# Patient Record
Sex: Female | Born: 1960 | Race: White | Hispanic: No | Smoking: Former smoker
Health system: Southern US, Community
[De-identification: ages and names within clinical notes are randomized; demographics above are authoritative.]

## PROBLEM LIST (undated history)

## (undated) DIAGNOSIS — R194 Change in bowel habit: Secondary | ICD-10-CM

## (undated) DIAGNOSIS — E871 Hypo-osmolality and hyponatremia: Secondary | ICD-10-CM

## (undated) DIAGNOSIS — R112 Nausea with vomiting, unspecified: Secondary | ICD-10-CM

## (undated) DIAGNOSIS — F1910 Other psychoactive substance abuse, uncomplicated: Secondary | ICD-10-CM

## (undated) DIAGNOSIS — F32A Depression, unspecified: Secondary | ICD-10-CM

## (undated) DIAGNOSIS — G43909 Migraine, unspecified, not intractable, without status migrainosus: Secondary | ICD-10-CM

## (undated) DIAGNOSIS — M810 Age-related osteoporosis without current pathological fracture: Secondary | ICD-10-CM

## (undated) DIAGNOSIS — R32 Unspecified urinary incontinence: Secondary | ICD-10-CM

## (undated) DIAGNOSIS — R143 Flatulence: Secondary | ICD-10-CM

## (undated) DIAGNOSIS — T7840XA Allergy, unspecified, initial encounter: Secondary | ICD-10-CM

## (undated) DIAGNOSIS — F1011 Alcohol abuse, in remission: Secondary | ICD-10-CM

## (undated) DIAGNOSIS — R51 Headache: Secondary | ICD-10-CM

## (undated) DIAGNOSIS — F4310 Post-traumatic stress disorder, unspecified: Secondary | ICD-10-CM

## (undated) DIAGNOSIS — R519 Headache, unspecified: Secondary | ICD-10-CM

## (undated) DIAGNOSIS — A64 Unspecified sexually transmitted disease: Secondary | ICD-10-CM

## (undated) DIAGNOSIS — Z9889 Other specified postprocedural states: Secondary | ICD-10-CM

## (undated) DIAGNOSIS — Z87440 Personal history of urinary (tract) infections: Secondary | ICD-10-CM

## (undated) DIAGNOSIS — F329 Major depressive disorder, single episode, unspecified: Secondary | ICD-10-CM

## (undated) DIAGNOSIS — N3010 Interstitial cystitis (chronic) without hematuria: Secondary | ICD-10-CM

## (undated) DIAGNOSIS — K219 Gastro-esophageal reflux disease without esophagitis: Secondary | ICD-10-CM

## (undated) DIAGNOSIS — M1990 Unspecified osteoarthritis, unspecified site: Secondary | ICD-10-CM

## (undated) HISTORY — PX: INCONTINENCE SURGERY: SHX676

## (undated) HISTORY — PX: LAPAROSCOPY: SHX197

## (undated) HISTORY — DX: Gastro-esophageal reflux disease without esophagitis: K21.9

## (undated) HISTORY — DX: Hypo-osmolality and hyponatremia: E87.1

## (undated) HISTORY — DX: Unspecified urinary incontinence: R32

## (undated) HISTORY — DX: Interstitial cystitis (chronic) without hematuria: N30.10

## (undated) HISTORY — DX: Other psychoactive substance abuse, uncomplicated: F19.10

## (undated) HISTORY — DX: Unspecified sexually transmitted disease: A64

## (undated) HISTORY — DX: Age-related osteoporosis without current pathological fracture: M81.0

## (undated) HISTORY — DX: Allergy, unspecified, initial encounter: T78.40XA

## (undated) HISTORY — DX: Migraine, unspecified, not intractable, without status migrainosus: G43.909

## (undated) HISTORY — DX: Flatulence: R14.3

## (undated) HISTORY — PX: BREAST BIOPSY: SHX20

## (undated) HISTORY — PX: TOTAL HIP ARTHROPLASTY: SHX124

## (undated) HISTORY — DX: Depression, unspecified: F32.A

## (undated) HISTORY — DX: Major depressive disorder, single episode, unspecified: F32.9

## (undated) HISTORY — DX: Personal history of urinary (tract) infections: Z87.440

## (undated) HISTORY — DX: Post-traumatic stress disorder, unspecified: F43.10

## (undated) HISTORY — DX: Alcohol abuse, in remission: F10.11

## (undated) HISTORY — PX: COLONOSCOPY: SHX174

## (undated) HISTORY — DX: Change in bowel habit: R19.4

---

## 1998-08-21 ENCOUNTER — Other Ambulatory Visit: Admission: RE | Admit: 1998-08-21 | Discharge: 1998-08-21 | Payer: Self-pay | Admitting: *Deleted

## 1999-11-05 ENCOUNTER — Other Ambulatory Visit: Admission: RE | Admit: 1999-11-05 | Discharge: 1999-11-05 | Payer: Self-pay | Admitting: Obstetrics and Gynecology

## 2000-02-09 ENCOUNTER — Ambulatory Visit (HOSPITAL_COMMUNITY): Admission: RE | Admit: 2000-02-09 | Discharge: 2000-02-09 | Payer: Self-pay | Admitting: Obstetrics and Gynecology

## 2001-07-18 ENCOUNTER — Ambulatory Visit (HOSPITAL_BASED_OUTPATIENT_CLINIC_OR_DEPARTMENT_OTHER): Admission: RE | Admit: 2001-07-18 | Discharge: 2001-07-18 | Payer: Self-pay | Admitting: Urology

## 2001-08-29 ENCOUNTER — Other Ambulatory Visit: Admission: RE | Admit: 2001-08-29 | Discharge: 2001-08-29 | Payer: Self-pay | Admitting: Obstetrics and Gynecology

## 2001-09-18 ENCOUNTER — Encounter: Payer: Self-pay | Admitting: Obstetrics and Gynecology

## 2001-09-18 ENCOUNTER — Encounter: Admission: RE | Admit: 2001-09-18 | Discharge: 2001-09-18 | Payer: Self-pay | Admitting: Obstetrics and Gynecology

## 2002-08-06 ENCOUNTER — Other Ambulatory Visit: Admission: RE | Admit: 2002-08-06 | Discharge: 2002-08-06 | Payer: Self-pay | Admitting: Obstetrics and Gynecology

## 2004-04-02 ENCOUNTER — Encounter: Admission: RE | Admit: 2004-04-02 | Discharge: 2004-04-02 | Payer: Self-pay | Admitting: Family Medicine

## 2004-07-30 ENCOUNTER — Encounter: Admission: RE | Admit: 2004-07-30 | Discharge: 2004-07-30 | Payer: Self-pay | Admitting: Family Medicine

## 2007-02-08 ENCOUNTER — Encounter: Admission: RE | Admit: 2007-02-08 | Discharge: 2007-02-08 | Payer: Self-pay | Admitting: Family Medicine

## 2009-04-03 ENCOUNTER — Encounter: Admission: RE | Admit: 2009-04-03 | Discharge: 2009-04-03 | Payer: Self-pay | Admitting: Family Medicine

## 2010-06-27 ENCOUNTER — Encounter: Payer: Self-pay | Admitting: Family Medicine

## 2010-06-29 ENCOUNTER — Encounter: Payer: Self-pay | Admitting: Family

## 2010-06-29 ENCOUNTER — Encounter: Payer: Self-pay | Admitting: Family Medicine

## 2010-09-04 ENCOUNTER — Ambulatory Visit (INDEPENDENT_AMBULATORY_CARE_PROVIDER_SITE_OTHER): Payer: BC Managed Care – PPO | Admitting: Family Medicine

## 2010-09-04 ENCOUNTER — Encounter: Payer: Self-pay | Admitting: Family Medicine

## 2010-09-04 VITALS — BP 112/74 | Temp 98.8°F | Ht 64.0 in | Wt 138.0 lb

## 2010-09-04 DIAGNOSIS — F339 Major depressive disorder, recurrent, unspecified: Secondary | ICD-10-CM | POA: Insufficient documentation

## 2010-09-04 DIAGNOSIS — F329 Major depressive disorder, single episode, unspecified: Secondary | ICD-10-CM

## 2010-09-04 DIAGNOSIS — F17200 Nicotine dependence, unspecified, uncomplicated: Secondary | ICD-10-CM

## 2010-09-04 DIAGNOSIS — Z720 Tobacco use: Secondary | ICD-10-CM

## 2010-09-04 DIAGNOSIS — F411 Generalized anxiety disorder: Secondary | ICD-10-CM

## 2010-09-04 DIAGNOSIS — F32A Depression, unspecified: Secondary | ICD-10-CM

## 2010-09-04 DIAGNOSIS — F419 Anxiety disorder, unspecified: Secondary | ICD-10-CM

## 2010-09-04 MED ORDER — CITALOPRAM HYDROBROMIDE 20 MG PO TABS: 20.0000 mg | ORAL_TABLET | Freq: Every day | ORAL | Status: DC

## 2010-09-04 NOTE — Progress Notes (Signed)
  Subjective:    Patient ID: Gabriela Shepard, female    DOB: [birth year], 50 y.o.   MRN: [id number]  HPI New patient to establish care. Past medical history reviewed. Patient has history of depression and chronic anxiety issues. Recently postmenopausal. Had significant hot flushes and replacement with estradiol. Also had Mirena IUD placed several months ago-reportedly for irregular periods. Still has some hot flashes. Current medications are clonazepam 1 mg 3 times a day, estradiol patch (unknown dosage) once weekly, and Wellbutrin XL 150 mg once daily. Allergy to sulfa.  The patient underwent divorce several years ago. Still dealing with a lot of stress issues related to that along with anxiety of not able to communicate with estranged 33 year old son. This has been very difficult emotionally for her. She's had significant counseling. Currently not on antidepressants. Feels anxious quite often. No suicidal ideation. Poor sleep quality.  Patient smokes less than one half pack yesterday trying to quit. Alcohol consumption with wine- sometimes excessively up to one bottle per day but not consistently. No other drug use. Reportedly diagnosed with posttraumatic stress disorder by counselor recently.   Review of Systems  Constitutional: Positive for fatigue. Negative for fever, chills, activity change and appetite change.  HENT: Negative for hearing loss and trouble swallowing.   Respiratory: Negative for cough and shortness of breath.   Cardiovascular: Negative for chest pain and leg swelling.  Gastrointestinal: Negative for nausea, vomiting and abdominal pain.  Genitourinary: Negative for dysuria.  Musculoskeletal: Negative for joint swelling and arthralgias.  Neurological: Negative for dizziness, seizures and syncope.  Hematological: Negative for adenopathy. Does not bruise/bleed easily.  Psychiatric/Behavioral: Positive for sleep disturbance and dysphoric mood. Negative for suicidal ideas,  hallucinations, confusion and self-injury. The patient is nervous/anxious.        Objective:   Physical Exam  Constitutional: She is oriented to person, place, and time. She appears well-developed and well-nourished.       Patient is anxious but appropriate in conversation  HENT:  Head: Normocephalic and atraumatic.  Mouth/Throat: Oropharynx is clear and moist. No oropharyngeal exudate.  Eyes: Pupils are equal, round, and reactive to light.  Neck: Neck supple. No thyromegaly present.  Cardiovascular: Normal rate, regular rhythm and normal heart sounds.   No murmur heard. Pulmonary/Chest: Effort normal and breath sounds normal. She has no wheezes. She has no rales.  Musculoskeletal: She exhibits no edema.  Lymphadenopathy:    She has no cervical adenopathy.  Neurological: She is alert and oriented to person, place, and time. No cranial nerve deficit.  Skin: No rash noted.  Psychiatric: Her behavior is normal. Thought content normal.          Assessment & Plan:  #1 situational stressors and anxiety #2 depression currently not remission #3 perimenopausal versus postmenopausal #4 history of reported urine incontinence  Start Celexa 20 mg daily and continue counseling. Reassess in 3 weeks time. Discussed other alternatives for hormonal therapy that time

## 2010-09-04 NOTE — Progress Notes (Deleted)
  Subjective:    Patient ID: Gabriela Shepard, female    DOB: [birth year], 50 y.o.   MRN: [id number]  HPI Patient recently noticed some slightly painful nodules under the skin and upper abdominal region and thigh region. She has past history of breast cancer. No recent appetite or weight changes. These areas have not margin of the past few weeks. No overlying skin changes.  Plan complaint of some increased thirst. Occasional moderate without vomiting. Concern about diabetes risk. Positive family history. Some increased urine frequency. No weight loss. Previous blood sugars pre-diabetes range   Review of Systems     Objective:   Physical Exam  Constitutional: She appears well-developed and well-nourished.  Cardiovascular: Normal rate, regular rhythm and normal heart sounds.   No murmur heard. Pulmonary/Chest: No respiratory distress. She has no wheezes. She has no rales.  Abdominal: Soft. There is no rebound and no guarding.       Patient is a very small areas are palpated which are less than 1/2 cm slightly tender in renal bed and fatty type density scattered upper and mid abdominal region. No hepatomegaly or splenomegaly. No overlying skin changes  Musculoskeletal: She exhibits no edema.          Assessment & Plan:  Probable panniculitis abdomen. Reassurance given. Polydipsia. Nonfasting blood sugar 134. Return for fasting to 3 weeks [place] upper thighs

## 2010-09-24 ENCOUNTER — Encounter: Payer: Self-pay | Admitting: Family Medicine

## 2010-09-24 ENCOUNTER — Ambulatory Visit (INDEPENDENT_AMBULATORY_CARE_PROVIDER_SITE_OTHER): Payer: BC Managed Care – PPO | Admitting: Family Medicine

## 2010-09-24 DIAGNOSIS — M546 Pain in thoracic spine: Secondary | ICD-10-CM

## 2010-09-24 DIAGNOSIS — M549 Dorsalgia, unspecified: Secondary | ICD-10-CM

## 2010-09-24 DIAGNOSIS — N951 Menopausal and female climacteric states: Secondary | ICD-10-CM

## 2010-09-24 MED ORDER — CYCLOBENZAPRINE HCL 10 MG PO TABS: 10.0000 mg | ORAL_TABLET | Freq: Two times a day (BID) | ORAL | Status: DC | PRN

## 2010-09-24 MED ORDER — CLONAZEPAM 1 MG PO TABS: 1.0000 mg | ORAL_TABLET | Freq: Three times a day (TID) | ORAL | Status: DC | PRN

## 2010-09-24 MED ORDER — ESTROGENS CONJUGATED 0.3 MG PO TABS: 0.3000 mg | ORAL_TABLET | Freq: Every day | ORAL | Status: DC

## 2010-09-24 MED ORDER — BUPROPION HCL ER (XL) 150 MG PO TB24: 150.0000 mg | ORAL_TABLET | Freq: Every day | ORAL | Status: DC

## 2010-09-24 NOTE — Progress Notes (Signed)
  Subjective:    Patient ID: Gabriela Shepard, female    DOB: [birth year], 50 y.o.   MRN: [id number]  HPI Followup from last visit. Refer to prior note. History of depression and anxiety. She started back Premarin 0.3 mg and had great improvement in mood. She never started Celexa. She has Mirena IUD so has source of progestin. Has been on addition of Premarin 0.3 mg orally past. 2 Climara patch which did not seem to work as well. She takes Wellbutrin 150 mg daily and needs refills.  Also takesKlonopin when necessary in the past but uses rarely. Has eliminated alcohol completely since last visit.  She has frequent upper back and neck muscular soreness and stiffness. Has used Flexeril as needed. This seems to help. Needs refills.   Review of Systems  Constitutional: Negative for appetite change and unexpected weight change.  HENT: Positive for neck stiffness.   Respiratory: Negative for cough and shortness of breath.   Cardiovascular: Negative for chest pain and leg swelling.  Gastrointestinal: Negative for abdominal pain.  Neurological: Negative for headaches.  Hematological: Negative for adenopathy.  Psychiatric/Behavioral: Negative for suicidal ideas.       Objective:   Physical Exam  Constitutional: She appears well-developed and well-nourished.  HENT:  Mouth/Throat: No oropharyngeal exudate.  Neck: No thyromegaly present.  Cardiovascular: Normal rate, regular rhythm and normal heart sounds.   No murmur heard. Pulmonary/Chest: No respiratory distress. She has no wheezes. She has no rales.  Musculoskeletal: She exhibits no edema.  Lymphadenopathy:    She has no cervical adenopathy.  Skin: No rash noted.          Assessment & Plan:  #1 Depression stable.  Refilled Wellbutrin for one year. #2  Perimenopausal.  Refilled Premarin low dose and this is not unopposed with her Mirena.  #3  Frequent upper back pain and muscle spasm.  Refilled Flexeril.

## 2010-10-23 NOTE — Op Note (Signed)
[facility]  Patient:    Gabriela Shepard, Gabriela Shepard                        MRN: [id number] Proc. Date: 02/09/00 Adm. Date:  [id number] Attending:  Oliver Pila                           Operative Report  PREOPERATIVE DIAGNOSES: 1. Chronic right-sided pelvic pain. 2. Previous cesarean section.  POSTOPERATIVE DIAGNOSES: 1. Chronic right-sided pelvic pain. 2. Previous cesarean section. 3. 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  PROCEDURE:  Diagnostic laparoscopy, lysis of adhesions.  SURGEON:  Huel Cote, M.D.  ANESTHESIA:  General.  FLUIDS/ESTIMATED BLOOD LOSS:  Minimal.  URINE OUTPUT:  200 cc clear.  IV FLUIDS:  2200 cc L.R.  FINDINGS:  Extensive pelvic adhesions of the anterior uterine fundus to the right anterior abdominal wall.  Ovaries are within normal limits bilaterally. Tubes are within normal limits bilaterally.  The appendix was normal, and the liver was normal.  The dense scar tissue was adherent from the top of the fundus down to the lower uterine segment.  DESCRIPTION OF PROCEDURE:  The patient was taken to the operating room where a general anesthesia was obtained without difficulty.  She was then prepped and draped in the normal sterile fashion in the dorsal lithotomy position.  A speculum was then placed within the vagina and uterus sounded to approximately 9-10 cm.  This was then removed, and the ZUMI syringe placed within the uterus for uterine manipulation.  The speculum was then removed from the vagina, and attention was turned to the patients abdomen where a small umbilical incision was made, approximately 1 cm with the scalpel.  The Veress needle was then easily introduced into the abdomen, and pneumoperitoneum obtained with approximately two liters of CO2 gas.  The abdomen and pelvis were then inspected with findings as previously stated.  Given that the ovaries and tubes were normal bilaterally,  attention was immediately turned to the anterior uterine wall.  Two additional ports were placed, one approximately 2 cm above the symphysis pubis and the other in the left lower quadrant.  These were 5 mm ports and placed under direct visualization.  Blunt probe was placed within the 5 mm port, and the dense adhesions were probed.  A bipolar cutting cautery unit was then applied to these adhesions in a slow and sequential fashion.  These were taken down with cauterization performed at each step with no significant bleeding noted.  This was also performed on the right round ligament which was also adherent abnormally to the anterior abdominal wall. This was severed, and the uterine fundus was significantly freed from the anterior wall in this process.  This was carried down to the level of the anterior cul-de-sac which was partially obliterated from these adhesions until there was no ability to distinguish between the bladder and the anterior uterine fundus.  Thus with the adhesions lysed, the uterus appeared to be in a normal position without significant further adhesions.  Therefore, the patients abdomen was once again inspected.  All areas were hemostatic.   The 5 mm ports were removed under direct visualization with no bleeding noted. The pneumoperitoneum was evacuated, and finally the 10 mm port was removed under direct visualization as well.  The umbilical incision was reinforced with 0 Vicryl at fascia level.  The skin was closed with 3-0  Vicryl and a subcuticular stitch at all sites.  Sponge, lap, and needle counts were correct x 2, and the patient was taken to the recovery room in stable condition. DD:  02/09/00 TD:  02/09/00 Job: [id number]

## 2010-10-23 NOTE — H&P (Signed)
[facility]  Patient:    Gabriela Shepard, Gabriela Shepard                          MRN: [id number] Adm. Date:  02/09/00 Attending:  Alvino Chapel, M.D.                         History and Physical  HISTORY OF PRESENT ILLNESS:  Patient is a 50 year old G3, P2-0-1-2 who has had a long history of right-sided pelvic pain with an extensive workup by her previous gynecologist.  Patient recently was forced to switch gynecologists secondary to a change in insurance.  Patients pain has been present for approximately one to two years; however, in the past several months, has become increasingly problematic, interfering with work and at a hobby she has with tennis.  The patient reports a chronic right-sided pain which makes it difficult for her to ambulate with her menstrual cycles as well as sciatic inflammation which is present with her cycles.  Her past workup included a vaginal sonogram which revealed a uterus with a 2- to 3-cm fibroid.  Patient had been treated on several different hormonal regimens; however, these did not significantly improve her pain.  Her husband has had a vasectomy; she does not require contraception at this time.  PAST MEDICAL HISTORY 1. History of premenstrual dysphoric disorder which had been treated with    Prozac in the past. 2. She has stress urinary incontinence. 3. In 1992, she had a cesarean section.  MEDICATIONS:  None.  ALLERGIES:  She has no known drug allergies.  PAST GYNECOLOGICAL HISTORY:  No abnormal Pap smears, with her last normal Pap smear being in May of 2001.  FAMILY HISTORY:  There is no significant history of breast cancer.  There is some heart disease.  PAST OBSTETRICAL HISTORY:  In 1992, she had a cesarean section and in 1994, a normal spontaneous vaginal delivery.  PHYSICAL EXAMINATION  VITAL SIGNS:  Weight is 132 pounds.  Height if 5 feet 4 inches.  HEART:  Patient has regular rate and rhythm to her  heart.  LUNGS:  Clear to auscultation bilaterally.  ABDOMEN:  Soft and nontender.  PELVIC:  Her uterus is upper limits of normal size and slightly tender.  She is tender in her right lower quadrant to deep palpation.  The left adnexa is normal with no masses noted.  ASSESSMENT:  Patient was counseled as to her options and given her failure of medical therapy in the past and the significant impact this is having upon her work and social life, patient would like to proceed with a diagnostic laparoscopy.  She was counseled as to the risks and benefits of the procedure including risk of damage to bowel and bladder as well as the risks of bleeding and infection.  Patient understands and desires to proceed.  Patient was counseled should there be significant pelvic adhesions or endometriosis, these would be cauterized at the time of laparoscopy. DD:  02/05/00 TD:  02/05/00 Job: [id number]

## 2010-10-23 NOTE — Op Note (Signed)
[facility]  Patient:    Gabriela Shepard, Gabriela Shepard Visit Number: [id number] MRN: [id number]          Service Type: NES Location: [facility] Attending Physician:  Evlyn Clines Dictated by:   Excell Seltzer. Annabell Howells, M.D. Proc. Date: 07/18/01 Admit Date:  07/18/2001   CC:         Alvino Chapel, M.D.   Operative Report  PREOPERATIVE DIAGNOSIS:  Cystocele and stress incontinence.  POSTOPERATIVE DIAGNOSIS:  Cystocele and stress incontinence.  OPERATION:  Anterior repair and SPARC sling.  SURGEON:  Excell Seltzer. Annabell Howells, M.D.  ANESTHESIA:  General  DRAIN:  Foley catheter.  COMPLICATIONS:  None.  INDICATIONS:  Gabriela Shepard is a 50 year old white female who has had progressive problems with stress incontinence.  It is impacting on her daily activities. She also was found to have a moderate cystocele.  After discussing the treatment options, she has elected to undergo a SPARC sling and anterior repair.  FINDINGS AND PROCEDURE:  The patient was taken to the operating room after receiving p.o. Tequin.  General anesthetic was induced.  She was placed in the lithotomy position.  Her mons was shaved.  She was prepped with Betadine solution and draped in the usual sterile fashion.  A weighted vaginal retractor was placed and a Foley catheter was inserted.  The bladder was drained.  The anterior vaginal wall was infiltrated with 10 cc of 1% Lidocaine with epinephrine to help define the planes and control hemostasis.  An anterior midline vaginal wall incision was made with the knife.  This was carried down through the mucosa to the pubovesical fascia.  The mucosa was elevated off the pubovesical and pubourethral fascia to the lateral aspects of the cystocele bulge.  The dissection was carried back to the cervix.  Once adequate dissection had been performed, the cystocele was reduced using two layers of 2-0 Vicryl horizontal mattress sutures.  Once the cystocele repair had been  performed, sponge was tacked in the vaginal vault and two incisions were made 2 cm lateral to the midline just above the pubis on each side.  Each incision was approximately 1.5 cm in length.  The [facility] trocars were then brought onto the field.  The right SPARC trocar was passed through the right abdominal incision.  The tip was passed through the anterior rectus fascia until the top of the pubis could be felt.  The trocar was then walked along the back of the pubis down lateral to the urethra.  The urethra was held to the contralateral side using a finger in the incision.  The trocar was then brought down on the tip of the finger at the mid urethral level.  This was then repeated in identical fashion on the left.  Once both trocars were passed, cystoscopy was performed using the 22 [place] scope and the 70 degree lens.  Examination revealed no evidence of trocar damage to the bladder wall with a fully distended bladder.  The ureters were in their normal anatomic position and were unremarkable.  Once cystoscopic inspection had been performed, the scope was removed and the sling material was snapped on to the end of the trocars and then drawn back up to the abdominal incisions on each side.  Once both end of the tapes were in position, the trocars were trimmed off and the sheath was removed on each side.  Care was taken to ensure appropriate position of the sling material at the mid urethra and an adequate laxity of  the tape was ensured by keeping a right angle between the urethra and the tape. Once the tape was in good position, the cystoscope was then replaced and a second inspection of the bladder wall was performed.  This demonstrated no evidence of injury to the bladder wall despite a distended bladder.  The bladder was left full and pressure on the bladder produced flow from the ureter which was appropriate to avoid over correction.  At this point, a Foley catheter was reinserted and the  bladder was partially drained. The catheter was then plugged. The vaginal wall mucosa was then trimmed with care being taken to avoid trimming excess mucosa distally and the vaginal mucosa was then closed using a running locked 2-0 Vicryl stitch.  There was some moderate venous bleeding after passage of the tape back into the abdominal incisions.  Total blood loss was approximately 300 cc.  Once the vaginal wall mucosa was closed, the weighted speculum was removed.  The cystocele appeared to be well reduced.  A packing of 2 inch Iodoform gauze was placed.  The anterior abdominal wall was cleaned, prepped with tincture of Benzoin and Steri-Strips were used to close the small incisions on the anterior abdominal wall.  At this point, the Foley catheter was placed to straight drainage.  The patient was taken down from the lithotomy position and moved to the recovery room in stable condition.  She will be maintained with the packing and Foley for three hours. It will then be removed if she is able to void.  She will be discharged home without a catheter.  There were no complications during the procedure. Dictated by:   Excell Seltzer. Annabell Howells, M.D. Attending Physician:  Evlyn Clines DD:  07/18/01 TD:  07/18/01 Job: [id number]

## 2010-11-20 ENCOUNTER — Other Ambulatory Visit: Payer: Self-pay | Admitting: *Deleted

## 2010-11-20 MED ORDER — CLONAZEPAM 1 MG PO TABS: 1.0000 mg | ORAL_TABLET | Freq: Three times a day (TID) | ORAL | Status: DC | PRN

## 2011-01-21 ENCOUNTER — Other Ambulatory Visit: Payer: Self-pay | Admitting: Chiropractic Medicine

## 2011-01-21 ENCOUNTER — Other Ambulatory Visit: Payer: Self-pay | Admitting: *Deleted

## 2011-01-21 ENCOUNTER — Ambulatory Visit
Admission: RE | Admit: 2011-01-21 | Discharge: 2011-01-21 | Disposition: A | Discharge status: Home / Self Care | Payer: BC Managed Care – PPO | Source: Ambulatory Visit | Attending: *Deleted | Admitting: *Deleted

## 2011-01-21 DIAGNOSIS — M545 Low back pain, unspecified: Secondary | ICD-10-CM

## 2011-01-21 DIAGNOSIS — R52 Pain, unspecified: Secondary | ICD-10-CM

## 2011-05-05 ENCOUNTER — Other Ambulatory Visit (INDEPENDENT_AMBULATORY_CARE_PROVIDER_SITE_OTHER): Payer: BC Managed Care – PPO

## 2011-05-05 DIAGNOSIS — Z0000 Encounter for general adult medical examination without abnormal findings: Secondary | ICD-10-CM

## 2011-05-05 LAB — BASIC METABOLIC PANEL
BUN: 11 mg/dL (ref 6–23)
Creatinine, Ser: 0.9 mg/dL (ref 0.4–1.2)
GFR: 68.57 mL/min (ref >60.00)
Glucose, Bld: 89 mg/dL (ref 70–99)
Potassium: 4 mEq/L (ref 3.5–5.1)

## 2011-05-05 LAB — POCT URINALYSIS DIPSTICK
Bilirubin, UA: NEGATIVE
Blood, UA: NEGATIVE
Glucose, UA: NEGATIVE
Ketones, UA: NEGATIVE
Spec Grav, UA: 1.01
pH, UA: 5.5

## 2011-05-05 LAB — CBC WITH DIFFERENTIAL/PLATELET
Basophils Absolute: 0 10*3/uL (ref 0.0–0.1)
HCT: 39.7 % (ref 36.0–46.0)
Lymphs Abs: 1.2 10*3/uL (ref 0.7–4.0)
Monocytes Absolute: 0.5 10*3/uL (ref 0.1–1.0)
Monocytes Relative: 12.6 % — ABNORMAL HIGH (ref 3.0–12.0)
Platelets: 228 10*3/uL (ref 150.0–400.0)
RDW: 12 % (ref 11.5–14.6)

## 2011-05-05 LAB — LIPID PANEL
Cholesterol: 153 mg/dL (ref 0–200)
HDL: 56.2 mg/dL (ref >39.00)
VLDL: 8 mg/dL (ref 0.0–40.0)

## 2011-05-05 LAB — HEPATIC FUNCTION PANEL
AST: 17 U/L (ref 0–37)
Total Bilirubin: 0.7 mg/dL (ref 0.3–1.2)

## 2011-05-05 LAB — TSH: TSH: 2.58 u[IU]/mL (ref 0.35–5.50)

## 2011-05-12 ENCOUNTER — Ambulatory Visit (INDEPENDENT_AMBULATORY_CARE_PROVIDER_SITE_OTHER): Payer: BC Managed Care – PPO | Admitting: Family Medicine

## 2011-05-12 ENCOUNTER — Encounter: Payer: Self-pay | Admitting: Family Medicine

## 2011-05-12 VITALS — BP 102/80 | HR 72 | Temp 98.7°F | Resp 12 | Ht 63.75 in | Wt 135.0 lb

## 2011-05-12 DIAGNOSIS — Z0000 Encounter for general adult medical examination without abnormal findings: Secondary | ICD-10-CM

## 2011-05-12 DIAGNOSIS — Z23 Encounter for immunization: Secondary | ICD-10-CM

## 2011-05-12 MED ORDER — FLUTICASONE PROPIONATE 50 MCG/ACT NA SUSP: 2.0000 | Freq: Every day | NASAL | Status: DC

## 2011-05-12 NOTE — Progress Notes (Signed)
Subjective:    Patient ID: Gabriela Shepard, female    DOB: 28-Nov-1960, 50 y.o.   MRN: [id number]  HPI  Patient here for complete physical. She has made major changes in her life over the past year. She's given up alcohol after history of some binges during the past year. Currently attends AA.  No alcohol in over 6 months. She has history of depression currently stable. Medications reviewed. Remains on Mirina IUD.  Pap smears have consistently been normal over the last several years.  Normal pap last year. Tetanus from 2007. Mammogram recently normal. No history of screening colonoscopy. No flu vaccine yet. Inconsistent exercise  Past Medical History  Diagnosis Date  . Depression   . Urine incontinence   . History of UTI    Past Surgical History  Procedure Date  . Incontinence surgery     reports that she quit smoking about 6 years ago. Her smoking use included Cigarettes. She has a 5 pack-year smoking history. She does not have any smokeless tobacco history on file. She reports that she drinks alcohol. She reports that she does not use illicit drugs. family history includes Alcohol abuse in her other; Arthritis in her other; Hypertension in her other; and Mental illness in her other. Allergies  Allergen Reactions  . Sulfa Antibiotics     hives      Review of Systems  Constitutional: Positive for appetite change. Negative for fever, activity change and fatigue.  HENT: Negative for hearing loss, ear pain, sore throat and trouble swallowing.   Eyes: Negative for visual disturbance.  Respiratory: Negative for cough and shortness of breath.   Cardiovascular: Negative for chest pain and palpitations.  Gastrointestinal: Positive for constipation. Negative for nausea, vomiting, abdominal pain, diarrhea and blood in stool.  Genitourinary: Negative for dysuria and hematuria.  Musculoskeletal: Negative for myalgias, back pain and arthralgias.  Skin: Negative for rash.  Neurological:  Negative for dizziness, syncope and headaches.  Hematological: Negative for adenopathy.  Psychiatric/Behavioral: Negative for confusion and dysphoric mood.       Objective:   Physical Exam  Constitutional: She is oriented to person, place, and time. She appears well-developed and well-nourished.  HENT:  Head: Normocephalic and atraumatic.  Eyes: EOM are normal. Pupils are equal, round, and reactive to light.  Neck: Normal range of motion. Neck supple. No thyromegaly present.  Cardiovascular: Normal rate, regular rhythm and normal heart sounds.   No murmur heard. Pulmonary/Chest: Breath sounds normal. No respiratory distress. She has no wheezes. She has no rales.  Abdominal: Soft. Bowel sounds are normal. She exhibits no distension and no mass. There is no tenderness. There is no rebound and no guarding.  Genitourinary:       Breast exam normal. Pap smear deferred since she's had regular for several years. We'll plan a repeat next year  Musculoskeletal: Normal range of motion. She exhibits no edema.  Lymphadenopathy:    She has no cervical adenopathy.  Neurological: She is alert and oriented to person, place, and time. She displays normal reflexes. No cranial nerve deficit.  Skin: No rash noted.  Psychiatric: She has a normal mood and affect. Her behavior is normal. Judgment and thought content normal.          Assessment & Plan:  Complete physical. Wellness issues addressed. Flu vaccine given. Labs reviewed with patient. Schedule screening colonoscopy. Patient prefers this year if possible. Tetanus up to date. Recent mammogram normal.  Chronic rhinitis. Question allergic vs vasomotor. Add Flonase  2 sprays per nostril once daily

## 2011-05-17 ENCOUNTER — Encounter: Payer: Self-pay | Admitting: Gastroenterology

## 2011-05-21 ENCOUNTER — Ambulatory Visit (AMBULATORY_SURGERY_CENTER): Payer: BC Managed Care – PPO

## 2011-05-21 VITALS — Ht 64.0 in | Wt 135.0 lb

## 2011-05-21 DIAGNOSIS — Z1211 Encounter for screening for malignant neoplasm of colon: Secondary | ICD-10-CM

## 2011-05-21 MED ORDER — PEG-KCL-NACL-NASULF-NA ASC-C 100 G PO SOLR: 1.0000 | Freq: Once | ORAL | Status: DC

## 2011-05-24 ENCOUNTER — Encounter: Payer: Self-pay | Admitting: Gastroenterology

## 2011-05-27 ENCOUNTER — Telehealth: Payer: Self-pay

## 2011-05-27 NOTE — Telephone Encounter (Signed)
Request received from the pharmacy regarding [employer] prep expense.  I have a discount coupon that the pt can pick up at the office.  Left message on machine to call back

## 2011-05-27 NOTE — Telephone Encounter (Signed)
Pt returned call and will pick up rebate

## 2011-06-04 ENCOUNTER — Encounter: Payer: Self-pay | Admitting: Gastroenterology

## 2011-06-04 ENCOUNTER — Ambulatory Visit (AMBULATORY_SURGERY_CENTER): Payer: BC Managed Care – PPO | Admitting: Gastroenterology

## 2011-06-04 VITALS — BP 128/82 | HR 68 | Temp 97.1°F | Resp 32 | Ht 64.0 in | Wt 135.0 lb

## 2011-06-04 DIAGNOSIS — Z1211 Encounter for screening for malignant neoplasm of colon: Secondary | ICD-10-CM

## 2011-06-04 DIAGNOSIS — D126 Benign neoplasm of colon, unspecified: Secondary | ICD-10-CM

## 2011-06-04 MED ORDER — SODIUM CHLORIDE 0.9 % IV SOLN: 500.0000 mL | INTRAVENOUS | Status: DC

## 2011-06-04 NOTE — Progress Notes (Signed)
Patient did not experience any of the following events: a burn prior to discharge; a fall within the facility; wrong site/side/patient/procedure/implant event; or a hospital transfer or hospital admission upon discharge from the facility. (G8907) Patient did not have preoperative order for IV antibiotic SSI prophylaxis. (G8918)  

## 2011-06-04 NOTE — Op Note (Signed)
[facility] [address] [place], [place]  [address]  COLONOSCOPY PROCEDURE REPORT  PATIENT:  Gabriela, Shepard  MR#:  [id number] BIRTHDATE:  [birth year], 50 yrs. old  GENDER:  female ENDOSCOPIST:  Rachael Fee, MD REF. BY:  [name], M.D. PROCEDURE DATE:  06/04/2011 PROCEDURE:  Colonoscopy with snare polypectomy ASA CLASS:  Class II INDICATIONS:  Routine Risk Screening MEDICATIONS:   Fentanyl 75 mcg IV, These medications were titrated to patient response per physician's verbal order, Versed 7 mg IV  DESCRIPTION OF PROCEDURE:   After the risks benefits and alternatives of the procedure were thoroughly explained, informed consent was obtained.  Digital rectal exam was performed and revealed no rectal masses.   The LB 180AL [id number] endoscope was introduced through the anus and advanced to the cecum, which was identified by both the appendix and ileocecal valve, without limitations.  The quality of the prep was good..  The instrument was then slowly withdrawn as the colon was fully examined. <<PROCEDUREIMAGES>> FINDINGS:  Two sessile polyps were found. These were located in ascending colon, 5-46mm across, both removed with cold snare, both retrieved and sent to pathology (jar 1) (see image3 and image5). This was otherwise a normal examination of the colon (see image6, image2, and image1).   Retroflexed views in the rectum revealed no abnormalities. COMPLICATIONS:  None  ENDOSCOPIC IMPRESSION: 1) Two polyps were found, both were removed and sent to pathology 2) Otherwise normal examination  RECOMMENDATIONS: 1) If the polyp(s) removed today are proven to be adenomatous (pre-cancerous) polyps, you will need a repeat colonoscopy in 5 years. Otherwise you should continue to follow colorectal cancer screening guidelines for "routine risk" patients with colonoscopy in 10 years. 2) You will receive a letter within 1-2 weeks with the results of your biopsy as well  as final recommendations. Please call my office if you have not received a letter after 3 weeks.  ______________________________ Rachael Fee, MD  n. eSIGNED:   Rachael Fee at 06/04/2011 03:26 PM  Jessica Priest, [phone]

## 2011-06-04 NOTE — Patient Instructions (Signed)
FOLLOW THE INSTRUCTIONS ON THE GREEN AND BLUE DISCHARGE SHEETS.  CONTINUE YOUR MEDICATIONS.  AWAIT PATHOLOGY RESULTS.

## 2011-06-04 NOTE — Progress Notes (Signed)
IV very positional.  IV is in the vein, but I have o pull back to get a IV fluid drip.  MAW  IV sit assessed constantly.  No swelling, tissue around IV site soft. No redness noted. maw

## 2011-06-07 ENCOUNTER — Telehealth: Payer: Self-pay | Admitting: *Deleted

## 2011-06-07 ENCOUNTER — Telehealth: Payer: Self-pay | Admitting: Gastroenterology

## 2011-06-07 NOTE — Telephone Encounter (Signed)
Follow up Call- Patient questions:  Do you have a fever, pain , or abdominal swelling? no Pain Score  0 *  Have you tolerated food without any problems? yes  Have you been able to return to your normal activities? yes  Do you have any questions about your discharge instructions: Diet   no Medications  no Follow up visit  no  Do you have questions or concerns about your Care? no  Actions: * If pain score is 4 or above: No action needed, pain <4. Patient stating Patty frome 3rd floor GI had left a coupon for free Moviprep at desk last week. When she arrived at pharmacy it was only 20 % off. Patient requesting free coupon. Referred back to 3rd floor personal with telephone number.

## 2011-06-07 NOTE — Telephone Encounter (Signed)
Pt was left a free movi prep coupon at the front desk but can not find it another was mailed to her home

## 2011-07-06 ENCOUNTER — Other Ambulatory Visit: Payer: Self-pay | Admitting: Family Medicine

## 2011-07-06 NOTE — Telephone Encounter (Signed)
Refill once OK.  My understanding is that she has not been taking this regularly.

## 2011-07-06 NOTE — Telephone Encounter (Signed)
Last filled 11-20-10 #90 with 3 refills CPX 05-12-11

## 2011-07-07 NOTE — Telephone Encounter (Signed)
VM left for pt that Dr Caryl Never did not think she was taking this med regularly?

## 2011-09-23 ENCOUNTER — Encounter: Payer: Self-pay | Admitting: Family Medicine

## 2011-09-23 ENCOUNTER — Ambulatory Visit (INDEPENDENT_AMBULATORY_CARE_PROVIDER_SITE_OTHER): Payer: BC Managed Care – PPO | Admitting: Family Medicine

## 2011-09-23 VITALS — BP 140/80 | Temp 98.0°F | Wt 132.0 lb

## 2011-09-23 DIAGNOSIS — A09 Infectious gastroenteritis and colitis, unspecified: Secondary | ICD-10-CM

## 2011-09-23 DIAGNOSIS — A084 Viral intestinal infection, unspecified: Secondary | ICD-10-CM

## 2011-09-23 MED ORDER — DICYCLOMINE HCL 20 MG PO TABS: 20.0000 mg | ORAL_TABLET | Freq: Four times a day (QID) | ORAL | Status: DC

## 2011-09-23 NOTE — Patient Instructions (Signed)

## 2011-09-23 NOTE — Progress Notes (Signed)
  Subjective:    Patient ID: Gabriela Shepard, female    DOB: 1960/12/18, 51 y.o.   MRN: [id number]  HPI  Patient presents with onset of GI illness on Monday. She had nausea initially some vomiting and intermittent diarrhea. Denies diarrhea at this time but still has some diffuse abdominal cramping and mild nausea. No further vomiting. She has had diffuse body aches but no documented fever. No bloody stools. Intermittent mild headache. Appetite is improving. She had recent colonoscopy a few months ago with couple of benign polyps.  Review of Systems  Constitutional: Negative for fever and chills.  HENT: Negative for sore throat.   Respiratory: Negative for cough and shortness of breath.   Cardiovascular: Negative for chest pain.  Gastrointestinal: Positive for nausea, abdominal pain and diarrhea. Negative for vomiting and blood in stool.  Genitourinary: Negative for dysuria.       Objective:   Physical Exam  Constitutional: She appears well-developed and well-nourished.  HENT:  Mouth/Throat: Oropharynx is clear and moist.  Neck: Neck supple.  Cardiovascular: Normal rate and regular rhythm.   Pulmonary/Chest: Effort normal and breath sounds normal. No respiratory distress. She has no wheezes. She has no rales.  Abdominal: Soft. Bowel sounds are normal. She exhibits no distension and no mass. There is no rebound and no guarding.       Minimally tender left and right upper quadrants          Assessment & Plan:  Viral gastroenteritis. Mostly complaining of abdominal cramping at this point. Try Bentyl 20 mg every 6 hours when necessary. Advance diet slowly as tolerated

## 2011-11-02 ENCOUNTER — Other Ambulatory Visit: Payer: Self-pay | Admitting: Family Medicine

## 2011-11-02 NOTE — Telephone Encounter (Signed)
Is it okay to provide refills on Wellbutrin and Klonipin

## 2011-11-03 ENCOUNTER — Telehealth: Payer: Self-pay | Admitting: Family Medicine

## 2011-11-03 NOTE — Telephone Encounter (Signed)
Pt called and said that she needs a renewal of her buPROPion (WELLBUTRIN XL) 150 MG 24 hr tablet called in to [facility] on [address]. The old script has expired per pharmacy.

## 2011-11-03 NOTE — Telephone Encounter (Signed)
Rf sent previously on 11/02/11. Left detailed mess informing pt.

## 2011-11-03 NOTE — Telephone Encounter (Signed)
Refill both for 6 months. 

## 2011-11-29 ENCOUNTER — Ambulatory Visit (INDEPENDENT_AMBULATORY_CARE_PROVIDER_SITE_OTHER): Payer: BC Managed Care – PPO | Admitting: Family Medicine

## 2011-11-29 ENCOUNTER — Encounter: Payer: Self-pay | Admitting: Family Medicine

## 2011-11-29 VITALS — BP 100/70 | Temp 98.0°F | Wt 137.0 lb

## 2011-11-29 DIAGNOSIS — R42 Dizziness and giddiness: Secondary | ICD-10-CM

## 2011-11-29 DIAGNOSIS — G43909 Migraine, unspecified, not intractable, without status migrainosus: Secondary | ICD-10-CM

## 2011-11-29 DIAGNOSIS — K219 Gastro-esophageal reflux disease without esophagitis: Secondary | ICD-10-CM

## 2011-11-29 MED ORDER — ELETRIPTAN HYDROBROMIDE 40 MG PO TABS: 40.0000 mg | ORAL_TABLET | ORAL | Status: DC | PRN

## 2011-11-29 NOTE — Progress Notes (Signed)
  Subjective:    Patient ID: Gabriela Shepard, female    DOB: November 16, 1960, 51 y.o.   MRN: [id number]  HPI  Patient seen to discuss the following issues  History probable migraine headaches. She has intermittent headaches which are usually unilateral and sometimes worse with movement occasional nausea but no vomiting. No visual changes. Usually last several hours. Recently took a friend's Maxalt and this helped her headache tremendously. No recent changes in sleep pattern. No clear food triggers. Possibly related to changes in barometric pressure. She remains on low-dose estrogen but takes this daily.  Recently had some heartburn issues. Burning sensation substernally. Drinks some caffeine. No alcohol in over one year. Symptoms relieved with tums and over-the-counter antacids. No dysphasia. No appetite or weight change. Former smoker quit 2006   Review of Systems  Constitutional: Negative for appetite change and unexpected weight change.  HENT: Negative for trouble swallowing.   Respiratory: Negative for cough and shortness of breath.   Cardiovascular: Negative for chest pain, palpitations and leg swelling.  Gastrointestinal: Negative for nausea, vomiting and abdominal pain.  Neurological: Positive for headaches. Negative for dizziness.       Objective:   Physical Exam  Constitutional: She is oriented to person, place, and time. She appears well-developed and well-nourished. No distress.  HENT:  Mouth/Throat: Oropharynx is clear and moist.  Neck: Neck supple. No thyromegaly present.  Cardiovascular: Normal rate and regular rhythm.   Pulmonary/Chest: Effort normal and breath sounds normal. No respiratory distress. She has no wheezes. She has no rales.  Lymphadenopathy:    She has no cervical adenopathy.  Neurological: She is alert and oriented to person, place, and time. No cranial nerve deficit. Coordination normal.  Psychiatric: She has a normal mood and affect. Her behavior is  normal.          Assessment & Plan:  #1 migraine headaches. Trial of Relpax 40 mg one at onset of migraine may repeat one at 2 hours as needed. Touch base if these are not relieving her headaches fully.  We also discussed possible prophylactic measures if these persist. #2 GERD. Gradually reduce caffeine use. Continue over-the-counter antacid. Followup if not relieving.

## 2011-11-29 NOTE — Patient Instructions (Addendum)

## 2011-12-03 ENCOUNTER — Telehealth: Payer: Self-pay | Admitting: *Deleted

## 2011-12-03 NOTE — Telephone Encounter (Signed)
Fax from pharmacy, Relpax is #[phone] with insurance and coupon.  Pt requesting something cheaper please.    I called the pharmacy, all meds in the category are expensive.  Pharmacist says generic Imitrex 100 mg, #6 would be $65.62, #9 would be $98.32

## 2011-12-05 NOTE — Telephone Encounter (Signed)
Let's do generic Imitrex 100 mg #6 with prn refills

## 2011-12-06 MED ORDER — SUMATRIPTAN SUCCINATE 100 MG PO TABS: 100.0000 mg | ORAL_TABLET | ORAL | Status: DC | PRN

## 2012-02-01 ENCOUNTER — Encounter: Payer: Self-pay | Admitting: Family Medicine

## 2012-02-01 ENCOUNTER — Telehealth: Payer: Self-pay | Admitting: Family Medicine

## 2012-02-01 NOTE — Telephone Encounter (Signed)
Patient called in. States that she is in recovery, and that there has been trouble with her son, which you know about. She recently booked a flight to visit him in [place], but things went sour with her son, and she canceled the flight. She felt that if she went out there, she might be prone to relapse, due to the stress of the situation. She spoke with her sponsor, and was told that she may be able to get financial credit back on her canceled flight, if you were to write a letter stating that she had to cancel it due to her health (stress, fear of relapse, sobriety, etc.). Gabriela Shepard is wondering if you would be open to calling her and talking about the situation, and see if you would be willing to dictate a letter she could take to the airport folks to get a credit refund.  Please advise.

## 2012-02-01 NOTE — Telephone Encounter (Signed)
Let pt know I have produced letter. I intentionally tried to stay away from specifics of her medical hx.

## 2012-02-02 NOTE — Telephone Encounter (Signed)
I called pt and LMOM. Printed letter is in the FORMS folder for patient pick up.

## 2012-02-16 ENCOUNTER — Other Ambulatory Visit: Payer: Self-pay | Admitting: *Deleted

## 2012-02-16 MED ORDER — VALACYCLOVIR HCL 500 MG PO TABS: 500.0000 mg | ORAL_TABLET | Freq: Two times a day (BID) | ORAL | Status: AC | PRN

## 2012-02-16 NOTE — Telephone Encounter (Signed)
Faxed request received for Valtrex 500mg , one tab bid, last filled by Benedetto Goad, MD 05-19-2009 as informed by pharmacist.

## 2012-02-16 NOTE — Telephone Encounter (Signed)
Okay to add Valtrex 500 mg twice a day as needed, dispense #60 with 5 refills

## 2012-04-26 ENCOUNTER — Telehealth: Payer: Self-pay | Admitting: Family Medicine

## 2012-04-26 NOTE — Telephone Encounter (Signed)
We do not test for blood type.  Pt has CPE labs scheduled for 12/2, physical on 12/6. Please advise on " hormone level blood test"

## 2012-04-26 NOTE — Telephone Encounter (Signed)
There are MANY hormonal assessments.  I would do standard labs and discuss at CPE other possible tests.

## 2012-04-26 NOTE — Telephone Encounter (Signed)
Pt would like to have hormone level check also blood test for her blood type. Pt no longer taking premarin.Can I sch?

## 2012-04-26 NOTE — Telephone Encounter (Signed)
Pt informed on personally identified VM 

## 2012-05-08 ENCOUNTER — Other Ambulatory Visit (INDEPENDENT_AMBULATORY_CARE_PROVIDER_SITE_OTHER): Payer: BC Managed Care – PPO

## 2012-05-08 DIAGNOSIS — Z0000 Encounter for general adult medical examination without abnormal findings: Secondary | ICD-10-CM

## 2012-05-08 LAB — LIPID PANEL
HDL: 57.9 mg/dL (ref >39.00)
LDL Cholesterol: 109 mg/dL — ABNORMAL HIGH (ref 0–99)
Total CHOL/HDL Ratio: 3
Triglycerides: 50 mg/dL (ref 0.0–149.0)
VLDL: 10 mg/dL (ref 0.0–40.0)

## 2012-05-08 LAB — POCT URINALYSIS DIPSTICK
Bilirubin, UA: NEGATIVE
Blood, UA: NEGATIVE
Glucose, UA: NEGATIVE
Spec Grav, UA: 1.015

## 2012-05-08 LAB — BASIC METABOLIC PANEL
Calcium: 9.2 mg/dL (ref 8.4–10.5)
GFR: 89.19 mL/min (ref >60.00)
Glucose, Bld: 93 mg/dL (ref 70–99)
Sodium: 135 mEq/L (ref 135–145)

## 2012-05-08 LAB — CBC WITH DIFFERENTIAL/PLATELET
Basophils Absolute: 0 10*3/uL (ref 0.0–0.1)
Hemoglobin: 12.8 g/dL (ref 12.0–15.0)
Lymphocytes Relative: 29.4 % (ref 12.0–46.0)
Monocytes Relative: 10.2 % (ref 3.0–12.0)
Neutro Abs: 2.4 10*3/uL (ref 1.4–7.7)
RDW: 12.6 % (ref 11.5–14.6)
WBC: 4.1 10*3/uL — ABNORMAL LOW (ref 4.5–10.5)

## 2012-05-08 LAB — HEPATIC FUNCTION PANEL
AST: 21 U/L (ref 0–37)
Albumin: 4.1 g/dL (ref 3.5–5.2)
Alkaline Phosphatase: 41 U/L (ref 39–117)
Total Bilirubin: 0.5 mg/dL (ref 0.3–1.2)

## 2012-05-12 ENCOUNTER — Encounter: Payer: Self-pay | Admitting: Family Medicine

## 2012-05-12 ENCOUNTER — Ambulatory Visit (INDEPENDENT_AMBULATORY_CARE_PROVIDER_SITE_OTHER): Payer: BC Managed Care – PPO | Admitting: Family Medicine

## 2012-05-12 VITALS — BP 118/64 | HR 72 | Temp 98.4°F | Resp 12 | Ht 63.75 in | Wt 135.0 lb

## 2012-05-12 DIAGNOSIS — Z01419 Encounter for gynecological examination (general) (routine) without abnormal findings: Secondary | ICD-10-CM | POA: Insufficient documentation

## 2012-05-12 DIAGNOSIS — Z0000 Encounter for general adult medical examination without abnormal findings: Secondary | ICD-10-CM

## 2012-05-12 DIAGNOSIS — J310 Chronic rhinitis: Secondary | ICD-10-CM

## 2012-05-12 DIAGNOSIS — R197 Diarrhea, unspecified: Secondary | ICD-10-CM

## 2012-05-12 MED ORDER — FLUTICASONE PROPIONATE 50 MCG/ACT NA SUSP: 2.0000 | Freq: Every day | NASAL | Status: DC

## 2012-05-12 NOTE — Progress Notes (Signed)
Subjective:    Patient ID: Gabriela Shepard, female    DOB: 09/18/1960, 51 y.o.   MRN: [id number]  HPI  Patient here for complete physical. She has seen gynecologist in the past but no pap in about 2 years now. She has a Mirena IUD which has been present for about 2 years. She has taken low-dose oral Premarin 0.3 mg but stopped couple months ago because of cost issues. She has history of some chronic anxiety and recurrent depression which are currently stable. She continues to deal with the stress of trying to re-establish relations with her son who has refused to speak to her in past.  Patient has acute issue of three-day history of watery to loose stool/ diarrhea. No nausea or vomiting. Some diffuse abdominal cramps. No fevers or chills.  She needs repeat mammogram. Immunizations are up to date. Frequent sinus congestion over the past couple months. Symptoms are worse at night and early morning and mostly frontal sinuses. She's tried Sudafed and saline nasal irrigation without much improvement. Also tried antihistamines in the past without improvement.  Past Medical History  Diagnosis Date  . Depression   . Urine incontinence   . History of UTI    Past Surgical History  Procedure Date  . Incontinence surgery     reports that she quit smoking about 7 years ago. Her smoking use included Cigarettes. She has a 5 pack-year smoking history. She has never used smokeless tobacco. She reports that she does not drink alcohol or use illicit drugs. family history includes Alcohol abuse in her other; Arthritis in her other; Hypertension in her mother and other; and Mental illness in her other.  There is no history of Colon cancer. Allergies  Allergen Reactions  . Sulfa Antibiotics     hives      Review of Systems  Constitutional: Positive for fatigue. Negative for fever, chills, appetite change and unexpected weight change.  HENT: Positive for congestion and sinus pressure. Negative for sore  throat.   Eyes: Negative for visual disturbance.  Respiratory: Negative for cough, shortness of breath and wheezing.   Cardiovascular: Negative for chest pain, palpitations and leg swelling.  Gastrointestinal: Positive for diarrhea. Negative for nausea, vomiting and abdominal pain.  Genitourinary: Negative for dysuria.  Skin: Negative for rash.  Neurological: Negative for dizziness, syncope, weakness and headaches.  Hematological: Negative for adenopathy. Does not bruise/bleed easily.       Objective:   Physical Exam  Constitutional: She is oriented to person, place, and time. She appears well-developed and well-nourished.  HENT:  Right Ear: External ear normal.  Left Ear: External ear normal.  Mouth/Throat: Oropharynx is clear and moist.  Eyes: Pupils are equal, round, and reactive to light.  Neck: Neck supple. No thyromegaly present.  Cardiovascular: Normal rate and regular rhythm.  Exam reveals no gallop.   Pulmonary/Chest: Effort normal and breath sounds normal. No respiratory distress. She has no wheezes. She has no rales.  Abdominal: Soft. She exhibits no distension. There is no tenderness.  Genitourinary:       Breasts are symmetric with no mass. Normal external genitalia. Cervix is normal in appearance. She has Mirena IUD. Pap smear obtained. Bimanual normal size uterus. No adnexal mass.  Musculoskeletal: She exhibits no edema.  Lymphadenopathy:    She has no cervical adenopathy.  Neurological: She is alert and oriented to person, place, and time. No cranial nerve deficit.  Skin: No rash noted.  Psychiatric: She has a normal mood and  affect. Her behavior is normal.          Assessment & Plan:  Complete physical. Pap smear obtained. Labs reviewed with patient. Patient will schedule mammogram. Colonoscopy last year.  Frequent sinus congestion. Question allergic. Trial of Flonase nasal 2 sprays per nostril once daily  Diarrhea probably related to recent viral illness.  Continue Imodium as needed.  Touch base by next week if persists.

## 2012-05-12 NOTE — Patient Instructions (Addendum)
Schedule repeat mammogram We will call you with Pap smear results Continue with regular weightbearing exercise such as walking Consume at least 1200 mg calcium per day and 800 international units of vitamin D per day

## 2012-05-14 ENCOUNTER — Encounter: Payer: Self-pay | Admitting: Family Medicine

## 2012-05-16 ENCOUNTER — Other Ambulatory Visit (HOSPITAL_COMMUNITY)
Admission: RE | Admit: 2012-05-16 | Discharge: 2012-05-16 | Disposition: A | Discharge status: Home / Self Care | Payer: BC Managed Care – PPO | Source: Ambulatory Visit | Attending: Family Medicine | Admitting: Family Medicine

## 2012-05-19 NOTE — Progress Notes (Signed)
Quick Note:  Pt informed on personally identified VM ______ 

## 2012-05-23 ENCOUNTER — Telehealth: Payer: Self-pay | Admitting: Family Medicine

## 2012-05-23 NOTE — Telephone Encounter (Signed)
Pt was in on 12.6 for physical. States Dr. Caryl Never was researching hormone options for her other than Premarin. Calling to get status on that. Please call.

## 2012-05-24 ENCOUNTER — Telehealth: Payer: Self-pay | Admitting: Family Medicine

## 2012-05-24 MED ORDER — ESTRADIOL 0.0375 MG/24HR TD PTTW: 1.0000 | MEDICATED_PATCH | TRANSDERMAL | Status: DC

## 2012-05-24 NOTE — Telephone Encounter (Signed)
There are several estrogen patch options.  Cost will be dictated by her insurance.  Let's try Vivelle dot 0.0375 mg one patch twice WEEKLY.  If her insurance does not cover this, let me know.

## 2012-05-24 NOTE — Telephone Encounter (Signed)
Patient Information:  Caller Name: Gabriela Shepard  Phone: [phone]  Patient: Gabriela Shepard  Gender: Female  DOB: 1960-07-22  Age: 51 Years  PCP: [name] (Family Practice)  Pregnant: No  Office Follow Up:  Does the office need to follow up with this patient?: No  Instructions For The Office: N/A  RN Note:  Has had 2 more episodes since first.  Probiotics seem to help. Episodes start with one day of fatigue, second day of lower abdominal area very noisy, third day diarrhea.  This episode started 04/22/12, today has had 13 episodes of loose stools.  Has tapered down to just smaller episodes--if eats, it comes back more full.  Now is drinking fluids.  Is having mild cramping. Has taken Dicyclomine 20 mg that Dr. Caryl Never has ordered in the past.  Is not sure if this will help or not because has just taken. The frequent episodes is affecting work.  Is wondering is she needs to have a stool sample.  Appointment scheduled with Dr. Caryl Never for 02/24/12 at 9:45  Symptoms  Reason For Call & Symptoms: Physical 2 wks ago:  prior an episode lasting a couple of days.  Doctor thought it was viral.  Since then have had 2 more episodes of this since then.  Can't related to any foods or exposure.  Reviewed Health History In EMR: Yes  Reviewed Medications In EMR: Yes  Reviewed Allergies In EMR: Yes  Reviewed Surgeries / Procedures: Yes  Date of Onset of Symptoms: 05/12/2012  Treatments Tried: Probiotics helped  with 2nd episode  Treatments Tried Worked: No OB:  LMP: Unknown  Guideline(s) Used:  Diarrhea  Disposition Per Guideline:   See Within 3 Days in Office  Reason For Disposition Reached:   Patient wants to be seen  Advice Given:  Fluids:  Drink more fluids, at least 8-10 glasses (8 oz or 240 ml) daily.  For example: sports drinks, diluted fruit juices, soft drinks.  Supplement this with saltine crackers or soups to make certain that you are getting sufficient fluid and salt to  meet your body's needs.  Diarrhea Medication - Bismuth Subsalicylate (e.g., Kaopectate, PeptoBismol):  Helps reduce diarrhea, vomiting, and abdominal cramping.  Adult dosage: 2 tablets or 2 tablespoons (30 ml) by mouth every hour if diarrhea continues to a maximum of 8 doses in a 24-hour period.  Do not use for more than 2 days  This medication can make the stools look dark or even black (but not red or tarry).  Diarrhea Medication  - Imodium AD:   Helps reduce diarrhea.  Adult dosage: 4 mg (2 capsules or 4 teaspoons or 20 ml) is the recommended first dose. You may take an additional 2 mg (1 capsule or 2 teaspoons or 10 ml) after each loose BM.  Maximum dosage: 16 mg (8 capsules or 16 teaspoons or 80 ml).  1 capsule = 2 mg; 1 teaspoon (5 ml) = 1 mg.  Caution: Do not use if you have a fever greater than 100F (37.8C). Do not use if there is blood or mucus in your stools. Do not use for more than 2 days.  Read all package instructions.  Call Back If:  You become worse.  Appointment Scheduled:  05/25/2012 09:45:00 Appointment Scheduled Provider:  [name] [facility])

## 2012-05-24 NOTE — Telephone Encounter (Signed)
Pt informed on VM, Rx sent 

## 2012-05-24 NOTE — Telephone Encounter (Signed)
Noted, pt has OV tomorrow

## 2012-05-25 ENCOUNTER — Encounter: Payer: Self-pay | Admitting: Family Medicine

## 2012-05-25 ENCOUNTER — Ambulatory Visit (INDEPENDENT_AMBULATORY_CARE_PROVIDER_SITE_OTHER): Payer: BC Managed Care – PPO | Admitting: Family Medicine

## 2012-05-25 VITALS — Temp 98.2°F | Wt 134.0 lb

## 2012-05-25 DIAGNOSIS — R197 Diarrhea, unspecified: Secondary | ICD-10-CM

## 2012-05-25 MED ORDER — HYOSCYAMINE SULFATE 0.375 MG PO CP12: 1.0000 | ORAL_CAPSULE | Freq: Two times a day (BID) | ORAL | Status: DC | PRN

## 2012-05-25 NOTE — Progress Notes (Signed)
  Subjective:    Patient ID: Gabriela Shepard, female    DOB: 22-Mar-1961, 51 y.o.   MRN: [id number]  HPI  Patient seen with intermittent diarrhea. Severe yesterday up to 13 stools. Loose to watery. She's had a long history of recurrent diarrhea frequently intermixed with constipation. She has diffuse abdominal cramping frequent with episodes. No recent travels. No recent antibiotics. She's tried over-the-counter aloe supplement which helped slightly. She's had 3 separate episodes this month with intermittent diarrhea for a few days duration. No fever. No bloody stools. She's tried Imodium and dicyclomine with slight improvement. She's tried avoidance of lactose in past which did not make a difference. No recent antibiotics. Poor appetite. No new medications  Past Medical History  Diagnosis Date  . Depression   . Urine incontinence   . History of UTI   . History of alcohol abuse    Past Surgical History  Procedure Date  . Incontinence surgery     reports that she quit smoking about 7 years ago. Her smoking use included Cigarettes. She has a 5 pack-year smoking history. She has never used smokeless tobacco. She reports that she does not drink alcohol or use illicit drugs. family history includes Alcohol abuse in her other; Arthritis in her other; Hypertension in her mother and other; and Mental illness in her other.  There is no history of Colon cancer. Allergies  Allergen Reactions  . Sulfa Antibiotics     hives      Review of Systems  Constitutional: Positive for fatigue. Negative for fever and chills.  Respiratory: Negative for shortness of breath.   Cardiovascular: Negative for chest pain.  Gastrointestinal: Positive for diarrhea. Negative for nausea and vomiting.  Genitourinary: Negative for dysuria.       Objective:   Physical Exam  Constitutional: She appears well-developed and well-nourished.  HENT:  Mouth/Throat: Oropharynx is clear and moist.  Cardiovascular: Normal  rate and regular rhythm.   Pulmonary/Chest: Effort normal and breath sounds normal. No respiratory distress. She has no wheezes. She has no rales.  Abdominal: Soft. Bowel sounds are normal. She exhibits no distension. There is no tenderness. There is no rebound and no guarding.          Assessment & Plan:  Intermittent diarrhea. Suspicion is strong for IBS with history of intermittent constipation and diarrhea. Recent colonoscopy normal. She will consider bring back stool studies if her diarrhea persists. Continue Imodium as needed. Trial Hyoscamine 0.375 mg twice a day prn.

## 2012-05-25 NOTE — Patient Instructions (Addendum)
Irritable Bowel Syndrome  Irritable Bowel Syndrome (IBS) is caused by a disturbance of normal bowel function. Other terms used are spastic colon, mucous colitis, and irritable colon. It does not require surgery, nor does it lead to cancer. There is no cure for IBS. But with proper diet, stress reduction, and medication, you will find that your problems (symptoms) will gradually disappear or improve. IBS is a common digestive disorder. It usually appears in late adolescence or early adulthood. Women develop it twice as often as men.  CAUSES   After food has been digested and absorbed in the small intestine, waste material is moved into the colon (large intestine). In the colon, water and salts are absorbed from the undigested products coming from the small intestine. The remaining residue, or fecal material, is held for elimination. Under normal circumstances, gentle, rhythmic contractions on the bowel walls push the fecal material along the colon towards the rectum. In IBS, however, these contractions are irregular and poorly coordinated. The fecal material is either retained too long, resulting in constipation, or expelled too soon, producing diarrhea.  SYMPTOMS   The most common symptom of IBS is pain. It is typically in the lower left side of the belly (abdomen). But it may occur anywhere in the abdomen. It can be felt as heartburn, backache, or even as a dull pain in the arms or shoulders. The pain comes from excessive bowel-muscle spasms and from the buildup of gas and fecal material in the colon. This pain:   Can range from sharp belly (abdominal) cramps to a dull, continuous ache.   Usually worsens soon after eating.   Is typically relieved by having a bowel movement or passing gas.  Abdominal pain is usually accompanied by constipation. But it may also produce diarrhea. The diarrhea typically occurs right after a meal or upon arising in the morning. The stools are typically soft and watery. They are often  flecked with secretions (mucus).  Other symptoms of IBS include:   Bloating.   Loss of appetite.   Heartburn.   Feeling sick to your stomach (nausea).   Belching   Vomiting   Gas.  IBS may also cause a number of symptoms that are unrelated to the digestive system:   Fatigue.   Headaches.   Anxiety   Shortness of breath   Difficulty in concentrating.   Dizziness.  These symptoms tend to come and go.  DIAGNOSIS   The symptoms of IBS closely mimic the symptoms of other, more serious digestive disorders. So your caregiver may wish to perform a variety of additional tests to exclude these disorders. He/she wants to be certain of learning what is wrong (diagnosis). The nature and purpose of each test will be explained to you.  TREATMENT  A number of medications are available to help correct bowel function and/or relieve bowel spasms and abdominal pain. Among the drugs available are:   Mild, non-irritating laxatives for severe constipation and to help restore normal bowel habits.   Specific anti-diarrheal medications to treat severe or prolonged diarrhea.   Anti-spasmodic agents to relieve intestinal cramps.   Your caregiver may also decide to treat you with a mild tranquilizer or sedative during unusually stressful periods in your life.  The important thing to remember is that if any drug is prescribed for you, make sure that you take it exactly as directed. Make sure that your caregiver knows how well it worked for you.  HOME CARE INSTRUCTIONS    Avoid foods that   are high in fat or oils. Some examples are:heavy cream, butter, frankfurters, sausage, and other fatty meats.   Avoid foods that have a laxative effect, such as fruit, fruit juice, and dairy products.   Cut out carbonated drinks, chewing gum, and "gassy" foods, such as beans and cabbage. This may help relieve bloating and belching.   Bran taken with plenty of liquids may help relieve constipation.   Keep track of what foods seem to trigger  your symptoms.   Avoid emotionally charged situations or circumstances that produce anxiety.   Start or continue exercising.   Get plenty of rest and sleep.  MAKE SURE YOU:    Understand these instructions.   Will watch your condition.   Will get help right away if you are not doing well or get worse.  Document Released: 05/24/2005 Document Revised: 08/16/2011 Document Reviewed: 01/12/2008  Exit[employer] Patient Information 2013 [employer].

## 2012-06-16 ENCOUNTER — Telehealth: Payer: Self-pay | Admitting: Family Medicine

## 2012-06-16 ENCOUNTER — Other Ambulatory Visit: Payer: Self-pay | Admitting: Family Medicine

## 2012-06-16 NOTE — Telephone Encounter (Signed)
Attempted to reach patient on callback; message left on identified voicemail; to call office for assistance. krs/can

## 2012-06-25 ENCOUNTER — Other Ambulatory Visit: Payer: Self-pay | Admitting: Family Medicine

## 2012-08-01 ENCOUNTER — Other Ambulatory Visit: Payer: Self-pay | Admitting: Family Medicine

## 2012-08-02 NOTE — Telephone Encounter (Signed)
Clonazepam tid last filled 11-02-11, #90 with 5 refills.  Pt had CPX on 12/13

## 2012-08-02 NOTE — Telephone Encounter (Signed)
Refill OK for 6 months. 

## 2012-08-04 ENCOUNTER — Telehealth: Payer: Self-pay | Admitting: *Deleted

## 2012-08-04 ENCOUNTER — Other Ambulatory Visit: Payer: Self-pay | Admitting: *Deleted

## 2012-08-04 MED ORDER — BUPROPION HCL ER (XL) 150 MG PO TB24: ORAL_TABLET | ORAL | Status: DC

## 2012-08-04 NOTE — Telephone Encounter (Signed)
Refill OK

## 2012-08-04 NOTE — Telephone Encounter (Signed)
Pt also needs refill on wellbutrin call into [facility]

## 2012-08-04 NOTE — Telephone Encounter (Signed)
Please call patient's meds in today if possible as she has been trying to get this.

## 2012-08-04 NOTE — Telephone Encounter (Signed)
Klonopin 1 mg TID PRN  last filled 11-02-11, #90 with 5 refills

## 2012-08-10 NOTE — Telephone Encounter (Signed)
Rx called in 

## 2012-09-29 ENCOUNTER — Other Ambulatory Visit: Payer: Self-pay

## 2012-09-29 DIAGNOSIS — Z1231 Encounter for screening mammogram for malignant neoplasm of breast: Secondary | ICD-10-CM

## 2012-10-03 ENCOUNTER — Ambulatory Visit
Admission: RE | Admit: 2012-10-03 | Discharge: 2012-10-03 | Disposition: A | Discharge status: Home / Self Care | Payer: BC Managed Care – PPO | Source: Ambulatory Visit

## 2012-10-03 DIAGNOSIS — Z1231 Encounter for screening mammogram for malignant neoplasm of breast: Secondary | ICD-10-CM

## 2012-10-20 ENCOUNTER — Other Ambulatory Visit: Payer: Self-pay | Admitting: Family Medicine

## 2012-11-29 ENCOUNTER — Other Ambulatory Visit: Payer: Self-pay | Admitting: Family Medicine

## 2012-11-29 NOTE — Telephone Encounter (Signed)
Last Rx: 05.16.14, #6x0 Last OV: 12.19.13 Rx to pharmacy per [facility] refill protocol/SLS

## 2012-12-05 ENCOUNTER — Encounter: Payer: Self-pay | Admitting: Family Medicine

## 2012-12-05 ENCOUNTER — Ambulatory Visit (INDEPENDENT_AMBULATORY_CARE_PROVIDER_SITE_OTHER): Payer: BC Managed Care – PPO | Admitting: Family Medicine

## 2012-12-05 VITALS — BP 114/66 | HR 68 | Temp 98.2°F | Ht 64.0 in | Wt 139.0 lb

## 2012-12-05 DIAGNOSIS — F329 Major depressive disorder, single episode, unspecified: Secondary | ICD-10-CM

## 2012-12-05 DIAGNOSIS — N951 Menopausal and female climacteric states: Secondary | ICD-10-CM

## 2012-12-05 DIAGNOSIS — F32A Depression, unspecified: Secondary | ICD-10-CM

## 2012-12-05 DIAGNOSIS — G43909 Migraine, unspecified, not intractable, without status migrainosus: Secondary | ICD-10-CM | POA: Insufficient documentation

## 2012-12-05 MED ORDER — BUPROPION HCL ER (XL) 300 MG PO TB24: 300.0000 mg | ORAL_TABLET | Freq: Every day | ORAL | Status: DC

## 2012-12-05 MED ORDER — ESTRADIOL 0.0375 MG/24HR TD PTTW: 1.0000 | MEDICATED_PATCH | TRANSDERMAL | Status: DC

## 2012-12-05 MED ORDER — TOPIRAMATE 25 MG PO CPSP: ORAL_CAPSULE | ORAL | Status: DC

## 2012-12-05 NOTE — Progress Notes (Signed)
  Subjective:    Patient ID: Gabriela Shepard, female    DOB: 09/13/1960, 52 y.o.   MRN: [id number]  HPI Here to discuss several items as follows  She has history of recurrent depression. Currently takes Wellbutrin XL 150 mg once daily. Denies any suicidal ideation. In frequent crying episodes. Mostly dealing with decreased motivation. Still engages and interacting with others and hobbies. She would like to explore options for improving her depressed mood. She's been abstinent from alcohol now for over 2 years and has excellent support groups that she attends.  History of perimenopause/menopause. She's been home topical estradiol but has had significant cost issues. Requesting change to minivelle topical.   She ran out of her previous prescription 2 weeks ago does have significant hot flushes since then. No vaginal spotting. She continues with Mirena  Patient history migraine headaches. Generally has about 4-5 migraines per month. Has taken Imitrex in the past would generally works but she does not like the side effects from this medication. She is aware that changes in barometric pressure trigger her migraines. They seem to be more cyclical. She is interested in possible prophylaxis.  Past Medical History  Diagnosis Date  . Depression   . Urine incontinence   . History of UTI   . History of alcohol abuse    Past Surgical History  Procedure Laterality Date  . Incontinence surgery      reports that she quit smoking about 7 years ago. Her smoking use included Cigarettes. She has a 5 pack-year smoking history. She has never used smokeless tobacco. She reports that she does not drink alcohol or use illicit drugs. family history includes Alcohol abuse in her other; Arthritis in her other; Hypertension in her mother and other; and Mental illness in her other.  There is no history of Colon cancer. Allergies  Allergen Reactions  . Sulfa Antibiotics     hives      Review of Systems   Constitutional: Negative for fever, chills, appetite change and unexpected weight change.  Eyes: Negative for visual disturbance.  Cardiovascular: Negative for chest pain.  Gastrointestinal: Positive for constipation. Negative for abdominal pain.  Endocrine: Positive for heat intolerance. Negative for polydipsia and polyuria.  Neurological: Positive for headaches. Negative for dizziness and syncope.  Psychiatric/Behavioral: Positive for dysphoric mood. Negative for suicidal ideas and agitation.       Objective:   Physical Exam  Constitutional: She is oriented to person, place, and time. She appears well-developed and well-nourished.  Eyes: Pupils are equal, round, and reactive to light.  Neck: Neck supple. No thyromegaly present.  Cardiovascular: Normal rate and regular rhythm.  Exam reveals no gallop.   Pulmonary/Chest: Effort normal and breath sounds normal. No respiratory distress. She has no wheezes. She has no rales.  Musculoskeletal: She exhibits no edema.  Neurological: She is alert and oriented to person, place, and time. No cranial nerve deficit.          Assessment & Plan:  #1 history of recurrent depression. Titrate Wellbutrin XL 300 mg once daily and touch base in one month for followup #2 post menopause. Change to minivelle patch one twice weekly 0.0375 mg per 24-hour #3 recurrent migraine headaches. Discussed prophylaxis. She has generally 4 or more per month. Would avoid beta blockers with her history of depression. Trial of Topamax 25 mg each bedtime after one week titrate to 50 mg each bedtime.

## 2012-12-05 NOTE — Patient Instructions (Addendum)
Migraine Headache A migraine headache is an intense, throbbing pain on one or both sides of your head. A migraine can last for 30 minutes to several hours. CAUSES  The exact cause of a migraine headache is not always known. However, a migraine may be caused when nerves in the brain become irritated and release chemicals that cause inflammation. This causes pain. SYMPTOMS  Pain on one or both sides of your head.  Pulsating or throbbing pain.  Severe pain that prevents daily activities.  Pain that is aggravated by any physical activity.  Nausea, vomiting, or both.  Dizziness.  Pain with exposure to bright lights, loud noises, or activity.  General sensitivity to bright lights, loud noises, or smells. Before you get a migraine, you may get warning signs that a migraine is coming (aura). An aura may include:  Seeing flashing lights.  Seeing bright spots, halos, or zig-zag lines.  Having tunnel vision or blurred vision.  Having feelings of numbness or tingling.  Having trouble talking.  Having muscle weakness. MIGRAINE TRIGGERS  Alcohol.  Smoking.  Stress.  Menstruation.  Aged cheeses.  Foods or drinks that contain nitrates, glutamate, aspartame, or tyramine.  Lack of sleep.  Chocolate.  Caffeine.  Hunger.  Physical exertion.  Fatigue.  Medicines used to treat chest pain (nitroglycerine), birth control pills, estrogen, and some blood pressure medicines. DIAGNOSIS  A migraine headache is often diagnosed based on:  Symptoms.  Physical examination.  A CT scan or MRI of your head. TREATMENT Medicines may be given for pain and nausea. Medicines can also be given to help prevent recurrent migraines.  HOME CARE INSTRUCTIONS  Only take over-the-counter or prescription medicines for pain or discomfort as directed by your caregiver. The use of long-term narcotics is not recommended.  Lie down in a dark, quiet room when you have a migraine.  Keep a journal  to find out what may trigger your migraine headaches. For example, write down:  What you eat and drink.  How much sleep you get.  Any change to your diet or medicines.  Limit alcohol consumption.  Quit smoking if you smoke.  Get 7 to 9 hours of sleep, or as recommended by your caregiver.  Limit stress.  Keep lights dim if bright lights bother you and make your migraines worse. SEEK IMMEDIATE MEDICAL CARE IF:   Your migraine becomes severe.  You have a fever.  You have a stiff neck.  You have vision loss.  You have muscular weakness or loss of muscle control.  You start losing your balance or have trouble walking.  You feel faint or pass out.  You have severe symptoms that are different from your first symptoms. MAKE SURE YOU:   Understand these instructions.  Will watch your condition.  Will get help right away if you are not doing well or get worse. Document Released: 05/24/2005 Document Revised: 08/16/2011 Document Reviewed: 05/14/2011 Exit[employer] Patient Information 2014 [employer].  

## 2012-12-06 ENCOUNTER — Other Ambulatory Visit: Payer: Self-pay

## 2013-01-01 ENCOUNTER — Other Ambulatory Visit: Payer: Self-pay

## 2013-01-01 MED ORDER — SUMATRIPTAN SUCCINATE 100 MG PO TABS: ORAL_TABLET | ORAL | Status: DC

## 2013-03-07 ENCOUNTER — Telehealth: Payer: Self-pay

## 2013-03-07 NOTE — Telephone Encounter (Signed)
[facility] Triage Call Report Triage Record Num: [id number] Operator: Frederico Hamman Patient Name: Gabriela Shepard Call Date & Time: 03/06/2013 5:04:36PM Patient Phone: [phone] PCP: [name] Patient Gender: Female PCP Fax : [phone] Patient DOB: 1960-11-11 [facility] Name: [facility] Reason for Call: Caller: [name]/Patient; PCP: [name] (Family Practice); CB#: [phone]; Call regarding Constipation; Gabriela Shepard states she has had less frequent bowel movements x one week. Normally has BM qod. Had small bowel movements on 9/22, 9/25 and 9/28. Had onset of abdominal bloating on 03/05/13. Had onset of abdominal discomfort, headache, lightheadedness , weakness and nausea on 03/06/13. Has epigastric area tenderness rated 4 to 5 on 1/10 pt scale. Left work early. Takes Aloe for constipation. Per abdominal pain protocol has ED dipsoition due to abdominal pain that has been continuous for 3 hours or more and unable to carry out normal activities-work. States she will wait and call office back in AM. States ED too expensive. Advised to callback at anytime if needed. Care advice given. Protocol(s) Used: Abdominal Distention Protocol(s) Used: Abdominal Pain Recommended Outcome per Protocol: See ED Immediately Reason for Outcome: Abdominal pain / discomfort is not improving or is worsening Abdominal pain that has steadily worsened over hours OR has been continuous for 3 hours or more AND any of the following: loss of appetite, vomiting starting after pain, any fever, OR unable to carry out normal activities Care Advice: ~ Another adult should drive. ~ Pain medication or laxatives should not be taken until symptoms are evaluated. ~ Do not eat or drink anything until evaluated by provider. Call EMS 911 if signs and symptoms of shock develop (such as unable to stand due to faintness, dizziness, or lightheadedness; new onset of confusion; slow to respond or difficult to  awaken; skin is pale, gray, cool, or moist to touch; severe weakness; loss of consciousness). ~ ~ IMMEDIATE ACTION

## 2013-03-07 NOTE — Telephone Encounter (Signed)
If having abdominal pain associated with decreased stools should be seen. Has she tried anything like Miralax?

## 2013-03-07 NOTE — Telephone Encounter (Signed)
Patient stated that she is doing better today still a little weak, no nausea and she had a little BM today. Patient stated no abdominal pain with the decreased stools. Pt stated that she would try the Miralax to and she would call and make appointment if her BM get worse.

## 2013-04-18 ENCOUNTER — Telehealth: Payer: Self-pay

## 2013-04-18 MED ORDER — CLONAZEPAM 1 MG PO TABS: ORAL_TABLET | ORAL | Status: DC

## 2013-04-18 NOTE — Telephone Encounter (Signed)
[facility]

## 2013-04-18 NOTE — Telephone Encounter (Signed)
Refill for 6 months. 

## 2013-04-18 NOTE — Telephone Encounter (Signed)
Called in RX 

## 2013-04-18 NOTE — Telephone Encounter (Signed)
Clonazepam  Last visit 12/05/12 Last refill 08/01/12 #90 5 refill

## 2013-05-21 ENCOUNTER — Telehealth: Payer: Self-pay | Admitting: Family Medicine

## 2013-05-21 ENCOUNTER — Encounter: Payer: Self-pay | Admitting: *Deleted

## 2013-05-21 NOTE — Telephone Encounter (Signed)
Patient Information:  Caller Name: Brizza  Phone: [phone]  Patient: Gabriela Shepard, Gabriela Shepard  Gender: Female  DOB: 23-Dec-1960  Age: 52 Years  PCP: [name] (Family Practice)  Pregnant: No  Office Follow Up:  Does the office need to follow up with this patient?: No  Instructions For The Office: N/A  RN Note:  Gabriela Shepard states she has had nasal congestion and nausea since 05/07/13.  Now c/o sinus pain and tenderness. Afebrile.  Already has appt scheduled for tomorrow at 1515.  Symptoms  Reason For Call & Symptoms: Nasal congestion, facial tenderness, and nausea  Reviewed Health History In EMR: Yes  Reviewed Medications In EMR: Yes  Reviewed Allergies In EMR: Yes  Reviewed Surgeries / Procedures: Yes  Date of Onset of Symptoms: 05/07/2013  Treatments Tried: Advil and Sudafed  Treatments Tried Worked: No OB / GYN:  LMP: 11/06/2012  Guideline(s) Used:  Colds  Disposition Per Guideline:   See Today or Tomorrow in Office  Reason For Disposition Reached:   Sinus congestion (pressure, fullness) present > 10 days  Advice Given:  For a Stuffy Nose - Use Nasal Washes:  Step-By-Step Instructions:   Step 2: Gently squirt or spray warm salt water into one of your nostrils.  Step 3: Some of the water may run into the back of your throat. Spit this out. If you swallow the salt water it will not hurt you.  Step 4: Blow your nose to clean out the water and mucus.  Step 5: Repeat steps 1-4 for the other nostril. You can do this a couple times a day if it seems to help you.  Call Back If:  Difficulty breathing occurs  You become worse  Patient Will Follow Care Advice:  YES

## 2013-05-22 ENCOUNTER — Ambulatory Visit (INDEPENDENT_AMBULATORY_CARE_PROVIDER_SITE_OTHER): Payer: BC Managed Care – PPO | Admitting: Family

## 2013-05-22 ENCOUNTER — Encounter: Payer: Self-pay | Admitting: Family

## 2013-05-22 VITALS — BP 120/80 | HR 70 | Temp 98.3°F | Wt 143.0 lb

## 2013-05-22 DIAGNOSIS — J019 Acute sinusitis, unspecified: Secondary | ICD-10-CM

## 2013-05-22 DIAGNOSIS — R5381 Other malaise: Secondary | ICD-10-CM

## 2013-05-22 MED ORDER — AMOXICILLIN-POT CLAVULANATE 875-125 MG PO TABS: 1.0000 | ORAL_TABLET | Freq: Two times a day (BID) | ORAL | Status: DC

## 2013-05-22 NOTE — Progress Notes (Signed)
Subjective:    Patient ID: Gabriela Shepard, female    DOB: August 08, 1960, 52 y.o.   MRN: [id number]  HPI 52 year old white female, nonsmoker, patient of Dr. Caryl Never is in today with complaints of right-sided facial pain, ears,, wheezing, coughing and congestion x10 days but worsening. She's been taking Flonase, Sudafed, and Advil without relief. Denies any sick contacts.   Review of Systems  Constitutional: Positive for fatigue.  HENT: Positive for congestion, postnasal drip, sinus pressure and sore throat.   Respiratory: Positive for cough. Negative for shortness of breath and wheezing.   Cardiovascular: Negative.   Skin: Negative.   Allergic/Immunologic: Positive for environmental allergies.  Neurological: Negative.   Psychiatric/Behavioral: Negative.    Past Medical History  Diagnosis Date  . Depression   . Urine incontinence   . History of UTI   . History of alcohol abuse     History   Social History  . Marital Status: Divorced    Spouse Name: N/A    Number of Children: N/A  . Years of Education: N/A   Occupational History  . Not on file.   Social History Main Topics  . Smoking status: Former Smoker -- 0.50 packs/day for 10 years    Types: Cigarettes    Quit date: 05/11/2005  . Smokeless tobacco: Never Used  . Alcohol Use: No  . Drug Use: No  . Sexual Activity: Not on file   Other Topics Concern  . Not on file   Social History Narrative  . No narrative on file    Past Surgical History  Procedure Laterality Date  . Incontinence surgery      Family History  Problem Relation Age of Onset  . Alcohol abuse Other   . Arthritis Other   . Hypertension Other   . Mental illness Other   . Colon cancer Neg Hx   . Hypertension Mother     Allergies  Allergen Reactions  . Sulfa Antibiotics     hives    Current Outpatient Prescriptions on File Prior to Visit  Medication Sig Dispense Refill  . buPROPion (WELLBUTRIN XL) 300 MG 24 hr tablet Take 1  tablet (300 mg total) by mouth daily.  30 tablet  11  . clonazePAM (KLONOPIN) 1 MG tablet TAKE ONE TABLET BY MOUTH THREE TIMES DAILY AS NEEDED  90 tablet  5  . estradiol (MINIVELLE) 0.0375 MG/24HR Place 1 patch onto the skin 2 (two) times a week.  8 patch  12  . fluticasone (FLONASE) 50 MCG/ACT nasal spray Place 2 sprays into the nose daily.  16 g  6  . levonorgestrel (MIRENA) 20 MCG/24HR IUD 1 each by Intrauterine route once.        . SUMAtriptan (IMITREX) 100 MG tablet TAKE ONE TABLET BY MOUTH EVERY 2 HOURS AS NEEDED  FOR MIGRAINE  6 tablet  1  . Hyoscyamine Sulfate 0.375 MG CP12 Take 1 capsule (0.375 mg total) by mouth 2 (two) times daily as needed.  60 each  0  . topiramate (TOPAMAX) 25 MG tablet Take 25 mg by mouth 2 (two) times daily. Take one tablet nightly for one week then titrate to 2 tablets nightly       No current facility-administered medications on file prior to visit.    BP 120/80  Pulse 70  Temp(Src) 98.3 F (36.8 C) (Oral)  Wt 143 lb (64.864 kg)chart    Objective:   Physical Exam  Constitutional: She is oriented to person, place, and time.  She appears well-developed and well-nourished.  HENT:  Right Ear: External ear normal.  Left Ear: External ear normal.  Right maxillary sinus tenderness to palpation. No frontal sinus tenderness to palpation.  Neck: Normal range of motion.  Cardiovascular: Normal rate, regular rhythm and normal heart sounds.   Pulmonary/Chest: Effort normal and breath sounds normal.  Musculoskeletal: Normal range of motion.  Neurological: She is alert and oriented to person, place, and time.  Skin: Skin is warm and dry.  Psychiatric: She has a normal mood and affect.          Assessment & Plan:  Assessment: 1. Acute sinusitis 2. Fatigue  Plan: Augmentin 875 one by mouth twice a day x10 days. Continue Flonase. Call the office if symptoms worsen or persist. Recheck as scheduled, and as needed.

## 2013-05-22 NOTE — Progress Notes (Signed)
Pre visit review using our clinic review tool, if applicable. No additional management support is needed unless otherwise documented below in the visit note. 

## 2013-05-22 NOTE — Patient Instructions (Signed)

## 2013-05-24 ENCOUNTER — Other Ambulatory Visit: Payer: Self-pay

## 2013-05-24 MED ORDER — FLUTICASONE PROPIONATE 50 MCG/ACT NA SUSP: 2.0000 | Freq: Every day | NASAL | Status: DC

## 2013-06-01 ENCOUNTER — Other Ambulatory Visit: Payer: Self-pay

## 2013-07-19 ENCOUNTER — Other Ambulatory Visit: Payer: Self-pay | Admitting: Family Medicine

## 2013-08-16 ENCOUNTER — Other Ambulatory Visit: Payer: Self-pay | Admitting: Family Medicine

## 2013-08-29 ENCOUNTER — Encounter: Payer: Self-pay | Admitting: Family Medicine

## 2013-08-29 ENCOUNTER — Ambulatory Visit (INDEPENDENT_AMBULATORY_CARE_PROVIDER_SITE_OTHER): Payer: BC Managed Care – PPO | Admitting: Family Medicine

## 2013-08-29 VITALS — BP 120/66 | HR 78 | Ht 64.0 in | Wt 140.0 lb

## 2013-08-29 DIAGNOSIS — Z0000 Encounter for general adult medical examination without abnormal findings: Secondary | ICD-10-CM

## 2013-08-29 LAB — HEPATIC FUNCTION PANEL
ALBUMIN: 4.2 g/dL (ref 3.5–5.2)
ALK PHOS: 50 U/L (ref 39–117)
ALT: 16 U/L (ref 0–35)
AST: 15 U/L (ref 0–37)
Bilirubin, Direct: 0 mg/dL (ref 0.0–0.3)
TOTAL PROTEIN: 7.6 g/dL (ref 6.0–8.3)
Total Bilirubin: 0.4 mg/dL (ref 0.3–1.2)

## 2013-08-29 LAB — CBC WITH DIFFERENTIAL/PLATELET
BASOS ABS: 0 10*3/uL (ref 0.0–0.1)
BASOS PCT: 0.3 % (ref 0.0–3.0)
EOS ABS: 0.1 10*3/uL (ref 0.0–0.7)
Eosinophils Relative: 2.7 % (ref 0.0–5.0)
HEMATOCRIT: 41.5 % (ref 36.0–46.0)
HEMOGLOBIN: 13.8 g/dL (ref 12.0–15.0)
LYMPHS ABS: 1.4 10*3/uL (ref 0.7–4.0)
Lymphocytes Relative: 32.8 % (ref 12.0–46.0)
MCHC: 33.1 g/dL (ref 30.0–36.0)
MCV: 89.4 fl (ref 78.0–100.0)
MONOS PCT: 13 % — AB (ref 3.0–12.0)
Monocytes Absolute: 0.6 10*3/uL (ref 0.1–1.0)
NEUTROS ABS: 2.2 10*3/uL (ref 1.4–7.7)
Neutrophils Relative %: 51.2 % (ref 43.0–77.0)
Platelets: 299 10*3/uL (ref 150.0–400.0)
RBC: 4.64 Mil/uL (ref 3.87–5.11)
RDW: 13.3 % (ref 11.5–14.6)
WBC: 4.3 10*3/uL — ABNORMAL LOW (ref 4.5–10.5)

## 2013-08-29 LAB — TSH: TSH: 1.48 u[IU]/mL (ref 0.35–5.50)

## 2013-08-29 LAB — BASIC METABOLIC PANEL
BUN: 11 mg/dL (ref 6–23)
CALCIUM: 9.6 mg/dL (ref 8.4–10.5)
CO2: 28 mEq/L (ref 19–32)
CREATININE: 0.9 mg/dL (ref 0.4–1.2)
Chloride: 97 mEq/L (ref 96–112)
GFR: 72.47 mL/min (ref >60.00)
Glucose, Bld: 71 mg/dL (ref 70–99)
Potassium: 5.2 mEq/L — ABNORMAL HIGH (ref 3.5–5.1)
Sodium: 133 mEq/L — ABNORMAL LOW (ref 135–145)

## 2013-08-29 LAB — LIPID PANEL
CHOLESTEROL: 168 mg/dL (ref 0–200)
HDL: 58.5 mg/dL (ref >39.00)
LDL Cholesterol: 93 mg/dL (ref 0–99)
TRIGLYCERIDES: 83 mg/dL (ref 0.0–149.0)
Total CHOL/HDL Ratio: 3
VLDL: 16.6 mg/dL (ref 0.0–40.0)

## 2013-08-29 MED ORDER — DOXYCYCLINE HYCLATE 100 MG PO CAPS: 100.0000 mg | ORAL_CAPSULE | Freq: Two times a day (BID) | ORAL | Status: DC

## 2013-08-29 MED ORDER — CYCLOBENZAPRINE HCL 10 MG PO TABS: 10.0000 mg | ORAL_TABLET | Freq: Three times a day (TID) | ORAL | Status: DC | PRN

## 2013-08-29 NOTE — Progress Notes (Signed)
Pre visit review using our clinic review tool, if applicable. No additional management support is needed unless otherwise documented below in the visit note. 

## 2013-08-29 NOTE — Progress Notes (Signed)
Subjective:    Patient ID: Gabriela Shepard, female    DOB: 1961-02-01, 54 y.o.   MRN: [id number]  HPI Patient seen for complete physical. She sees gynecologist and has a Mirena IUD. She is getting regular mammograms. She had colonoscopy age 45 with recommended five-year followup. Her tetanus is up-to-date. She is a recovering alcoholic. She has been abstinent for 3 years and is actively engaged in Alcoholics Anonymous. She's done extremely well over the past 3 years and maintaining abstinence. She is maintained on Wellbutrin and feels that this has helped to some extent.  She has acute issue of some mild erythema around her umbilicus. She has a piercing in this region and has had a couple infections previously. No drainage. No fevers or chills. Also requesting Flexeril which she has used intermittently for muscle tension in her back in the past. She's had some intermittent thoracic muscular tension off and on.  Past Medical History  Diagnosis Date  . Depression   . Urine incontinence   . History of UTI   . History of alcohol abuse    Past Surgical History  Procedure Laterality Date  . Incontinence surgery      reports that she quit smoking about 8 years ago. Her smoking use included Cigarettes. She has a 5 pack-year smoking history. She has never used smokeless tobacco. She reports that she does not drink alcohol or use illicit drugs. family history includes Alcohol abuse in her other; Arthritis in her other; Hypertension in her mother and other; Mental illness in her other. There is no history of Colon cancer. Allergies  Allergen Reactions  . Sulfa Antibiotics     hives      Review of Systems  Constitutional: Negative for fever, activity change, appetite change, fatigue and unexpected weight change.  HENT: Negative for ear pain, hearing loss, sore throat and trouble swallowing.   Eyes: Negative for visual disturbance.  Respiratory: Negative for cough and shortness of breath.    Cardiovascular: Negative for chest pain and palpitations.  Gastrointestinal: Negative for abdominal pain, diarrhea, constipation and blood in stool.  Genitourinary: Negative for dysuria and hematuria.  Musculoskeletal: Negative for arthralgias, back pain and myalgias.  Skin: Negative for rash.  Neurological: Negative for dizziness, syncope and headaches.  Hematological: Negative for adenopathy.  Psychiatric/Behavioral: Negative for confusion and dysphoric mood.       Objective:   Physical Exam  Constitutional: She is oriented to person, place, and time. She appears well-developed and well-nourished.  HENT:  Head: Normocephalic and atraumatic.  Eyes: EOM are normal. Pupils are equal, round, and reactive to light.  Neck: Normal range of motion. Neck supple. No thyromegaly present.  Cardiovascular: Normal rate, regular rhythm and normal heart sounds.   No murmur heard. Pulmonary/Chest: Breath sounds normal. No respiratory distress. She has no wheezes. She has no rales.  Abdominal: Soft. Bowel sounds are normal. She exhibits no distension and no mass. There is no tenderness. There is no rebound and no guarding.  Genitourinary:  Per GYN  Musculoskeletal: Normal range of motion. She exhibits no edema.  Lymphadenopathy:    She has no cervical adenopathy.  Neurological: She is alert and oriented to person, place, and time. She displays normal reflexes. No cranial nerve deficit.  Skin: No rash noted.  Patient has some mild erythema and tenderness just superior to the umbilical region. Area of erythema is only about 4 x 6 mm. No fluctuance. No pustules.  Psychiatric: She has a normal mood  and affect. Her behavior is normal. Judgment and thought content normal.          Assessment & Plan:  Complete physical. Colonoscopy up to date. Immunizations up to date. Obtain screening lab work.  Possible mild early cellulitis-umbilical region. Doxycycline 100 mg twice a day for 10 days. Followup  promptly for fever or increasing erythema

## 2013-10-22 ENCOUNTER — Other Ambulatory Visit: Payer: Self-pay | Admitting: Family Medicine

## 2013-12-01 ENCOUNTER — Other Ambulatory Visit: Payer: Self-pay | Admitting: Family Medicine

## 2013-12-19 ENCOUNTER — Encounter: Payer: Self-pay | Admitting: Family Medicine

## 2013-12-19 ENCOUNTER — Ambulatory Visit (INDEPENDENT_AMBULATORY_CARE_PROVIDER_SITE_OTHER): Payer: BC Managed Care – PPO | Admitting: Family Medicine

## 2013-12-19 VITALS — BP 120/80 | HR 76 | Wt 134.0 lb

## 2013-12-19 DIAGNOSIS — F329 Major depressive disorder, single episode, unspecified: Secondary | ICD-10-CM

## 2013-12-19 DIAGNOSIS — F3289 Other specified depressive episodes: Secondary | ICD-10-CM

## 2013-12-19 DIAGNOSIS — F32A Depression, unspecified: Secondary | ICD-10-CM

## 2013-12-19 DIAGNOSIS — Z780 Asymptomatic menopausal state: Secondary | ICD-10-CM

## 2013-12-19 MED ORDER — FLUOXETINE HCL 20 MG PO TABS: 20.0000 mg | ORAL_TABLET | Freq: Every day | ORAL | Status: DC

## 2013-12-19 MED ORDER — ESTRADIOL 0.025 MG/24HR TD PTTW: 1.0000 | MEDICATED_PATCH | TRANSDERMAL | Status: DC

## 2013-12-19 NOTE — Progress Notes (Signed)
   Subjective:    Patient ID: Gabriela Shepard, female    DOB: [birth year], 53 y.o.   MRN: [id number]  HPI Patient has had some recent issues with mood instability. She has history of depression. She is recovering alcoholic and is 3 years in abstinence. She's been very diligent with follow up with support group and counseling. Takes Wellbutrin but still feels sad often. She also has frequent symptoms of anxiety and some chronic sleep disturbance. Some fatigue. She's had some progressive arthritis issues and is not exercising because of some right hip arthritis. Recent TSH normal.  Post menopause. Frequent hot flashes. Previously used topical estrogen and would like to consider repeat use.  Past Medical History  Diagnosis Date  . Depression   . Urine incontinence   . History of UTI   . History of alcohol abuse    Past Surgical History  Procedure Laterality Date  . Incontinence surgery      reports that she quit smoking about 8 years ago. Her smoking use included Cigarettes. She has a 5 pack-year smoking history. She has never used smokeless tobacco. She reports that she does not drink alcohol or use illicit drugs. family history includes Alcohol abuse in her other; Arthritis in her other; Hypertension in her mother and other; Mental illness in her other. There is no history of Colon cancer. Allergies  Allergen Reactions  . Sulfa Antibiotics     hives      Review of Systems  Constitutional: Negative for appetite change and unexpected weight change.  Respiratory: Negative for shortness of breath.   Cardiovascular: Negative for chest pain.  Psychiatric/Behavioral: Positive for dysphoric mood. Negative for suicidal ideas, confusion and agitation.       Objective:   Physical Exam  Constitutional: She is oriented to person, place, and time. She appears well-developed and well-nourished.  Neck: Neck supple. No thyromegaly present.  Cardiovascular: Normal rate and regular rhythm.     No murmur heard. Pulmonary/Chest: Effort normal and breath sounds normal. No respiratory distress. She has no wheezes. She has no rales.  Neurological: She is alert and oriented to person, place, and time. No cranial nerve deficit.  Psychiatric: She has a normal mood and affect. Her behavior is normal. Judgment and thought content normal.          Assessment & Plan:  Recurrent depression. Not in remission. Add Prozac 20 mg once daily to Wellbutrin. Increase exercise. Recent TSH normal.  Touch base for follow up in one month.  Post menopause. Consider addition of Vivelle dot for symptomatic hot flushes.

## 2013-12-19 NOTE — Progress Notes (Signed)
Pre visit review using our clinic review tool, if applicable. No additional management support is needed unless otherwise documented below in the visit note. 

## 2014-01-08 ENCOUNTER — Other Ambulatory Visit: Payer: Self-pay | Admitting: Family Medicine

## 2014-01-09 ENCOUNTER — Telehealth: Payer: Self-pay | Admitting: Family Medicine

## 2014-01-09 NOTE — Telephone Encounter (Signed)
Pt is calling to let md know good call on prozac

## 2014-03-12 ENCOUNTER — Other Ambulatory Visit: Payer: Self-pay | Admitting: Family Medicine

## 2014-04-09 ENCOUNTER — Other Ambulatory Visit: Payer: Self-pay

## 2014-04-09 DIAGNOSIS — Z1231 Encounter for screening mammogram for malignant neoplasm of breast: Secondary | ICD-10-CM

## 2014-04-24 ENCOUNTER — Ambulatory Visit: Payer: BC Managed Care – PPO

## 2014-04-29 ENCOUNTER — Telehealth: Payer: Self-pay | Admitting: Family Medicine

## 2014-04-29 ENCOUNTER — Encounter: Payer: Self-pay | Admitting: Internal Medicine

## 2014-04-29 ENCOUNTER — Ambulatory Visit (INDEPENDENT_AMBULATORY_CARE_PROVIDER_SITE_OTHER): Payer: BC Managed Care – PPO | Admitting: Internal Medicine

## 2014-04-29 VITALS — BP 130/80 | HR 81 | Temp 97.5°F | Resp 20 | Ht 64.0 in | Wt 146.0 lb

## 2014-04-29 DIAGNOSIS — J0100 Acute maxillary sinusitis, unspecified: Secondary | ICD-10-CM

## 2014-04-29 MED ORDER — AMOXICILLIN-POT CLAVULANATE 875-125 MG PO TABS: 1.0000 | ORAL_TABLET | Freq: Two times a day (BID) | ORAL | Status: DC

## 2014-04-29 MED ORDER — PREDNISONE 10 MG PO TABS: 10.0000 mg | ORAL_TABLET | Freq: Two times a day (BID) | ORAL | Status: DC

## 2014-04-29 NOTE — Progress Notes (Signed)
Pre visit review using our clinic review tool, if applicable. No additional management support is needed unless otherwise documented below in the visit note. 

## 2014-04-29 NOTE — Patient Instructions (Signed)
    Use saline irrigation, warm  moist compresses and over-the-counter decongestants only as directed.  Call if there is no improvement in 5 to 7 days, or sooner if you develop increasing pain, fever, or any new symptoms.  Take your antibiotic as prescribed until ALL of it is gone, but stop if you develop a rash, swelling, or any side effects of the medication.  Contact our office as soon as possible if  there are side effects of the medication. 

## 2014-04-29 NOTE — Telephone Encounter (Signed)
Noted  

## 2014-04-29 NOTE — Progress Notes (Signed)
Subjective:    Patient ID: Gabriela Shepard, female    DOB: [birth year], 52 y.o.   MRN: [id number]  HPI  53 year old patient who has a history migraine headaches, as well as allergic rhinitis.  She has been on chronic fluticasone.  For the past 7-10 days.  She has had worsening headaches, sinus congestion, malaise and a general sense of unwellness.  She has continued fluticasone nasal spray.  The symptoms have been refractory to Advil Cold and Sinus medications.  She has used Imitrex with some alleviation of her headache.  She feels this is sinus related and not her classical migraine.  Her headache is mainly left facial headache.  No purulent drainage. She was treated for a sinus infection about one year ago with Augmentin, which she tolerated well.  She has remote tobacco use history  Past Medical History  Diagnosis Date  . Depression   . Urine incontinence   . History of UTI   . History of alcohol abuse     History   Social History  . Marital Status: Divorced    Spouse Name: N/A    Number of Children: N/A  . Years of Education: N/A   Occupational History  . Not on file.   Social History Main Topics  . Smoking status: Former Smoker -- 0.50 packs/day for 10 years    Types: Cigarettes    Quit date: 05/11/2005  . Smokeless tobacco: Never Used  . Alcohol Use: No  . Drug Use: No  . Sexual Activity: Not on file   Other Topics Concern  . Not on file   Social History Narrative    Past Surgical History  Procedure Laterality Date  . Incontinence surgery      Family History  Problem Relation Age of Onset  . Alcohol abuse Other   . Arthritis Other   . Hypertension Other   . Mental illness Other   . Colon cancer Neg Hx   . Hypertension Mother     Allergies  Allergen Reactions  . Sulfa Antibiotics     hives    Current Outpatient Prescriptions on File Prior to Visit  Medication Sig Dispense Refill  . buPROPion (WELLBUTRIN XL) 300 MG 24 hr tablet TAKE 1 TABLET BY  MOUTH EVERY DAY 30 tablet 5  . clonazePAM (KLONOPIN) 1 MG tablet TAKE 1 TABLET BY MOUTH 3 TIMES DAILY AS NEEDED 90 tablet 2  . cyclobenzaprine (FLEXERIL) 10 MG tablet TAKE 1 TABLET BY MOUTH 3 TIMES DAILY AS NEEDED FOR MUSCLE SPASMS 30 tablet 1  . FLUoxetine (PROZAC) 20 MG tablet Take 1 tablet (20 mg total) by mouth daily. 30 tablet 6  . fluticasone (FLONASE) 50 MCG/ACT nasal spray Place 2 sprays into both nostrils daily. 16 g 6  . Hyoscyamine Sulfate 0.375 MG CP12 Take 1 capsule (0.375 mg total) by mouth 2 (two) times daily as needed. 60 each 0  . levonorgestrel (MIRENA) 20 MCG/24HR IUD 1 each by Intrauterine route once.      . SUMAtriptan (IMITREX) 100 MG tablet TAKE 1 TABLET EVERY 2 HOURS AS NEEDED FOR MIGRAINE 6 tablet 2  . estradiol (VIVELLE-DOT) 0.025 MG/24HR Place 1 patch onto the skin 2 (two) times a week. (Patient not taking: Reported on 04/29/2014) 8 patch 12   No current facility-administered medications on file prior to visit.    BP 130/80 mmHg  Pulse 81  Temp(Src) 97.5 F (36.4 C) (Oral)  Resp 20  Ht 5\' 4"  (1.626 m)  Wt  146 lb (66.225 kg)  BMI 25.05 kg/m2  SpO2 98%     Review of Systems  Constitutional: Positive for activity change, appetite change and fatigue.  HENT: Positive for congestion and sinus pressure. Negative for dental problem, hearing loss, rhinorrhea, sore throat and tinnitus.   Eyes: Negative for pain, discharge and visual disturbance.  Respiratory: Negative for cough and shortness of breath.   Cardiovascular: Negative for chest pain, palpitations and leg swelling.  Gastrointestinal: Negative for nausea, vomiting, abdominal pain, diarrhea, constipation, blood in stool and abdominal distention.  Genitourinary: Negative for dysuria, urgency, frequency, hematuria, flank pain, vaginal bleeding, vaginal discharge, difficulty urinating, vaginal pain and pelvic pain.  Musculoskeletal: Negative for joint swelling, arthralgias and gait problem.  Skin: Negative for  rash.  Neurological: Positive for headaches. Negative for dizziness, syncope, speech difficulty, weakness and numbness.  Hematological: Negative for adenopathy.  Psychiatric/Behavioral: Negative for behavioral problems, dysphoric mood and agitation. The patient is not nervous/anxious.        Objective:   Physical Exam  Constitutional: She is oriented to person, place, and time. She appears well-developed and well-nourished.  HENT:  Head: Normocephalic.  Right Ear: External ear normal.  Left Ear: External ear normal.  Mouth/Throat: Oropharynx is clear and moist.  Left maxillary sinus tenderness to palpation  Eyes: Conjunctivae and EOM are normal. Pupils are equal, round, and reactive to light.  Neck: Normal range of motion. Neck supple. No thyromegaly present.  Cardiovascular: Normal rate, regular rhythm, normal heart sounds and intact distal pulses.   Pulmonary/Chest: Effort normal and breath sounds normal.  Abdominal: Soft. Bowel sounds are normal. She exhibits no mass. There is no tenderness.  Musculoskeletal: Normal range of motion.  Lymphadenopathy:    She has no cervical adenopathy.  Neurological: She is alert and oriented to person, place, and time.  Skin: Skin is warm and dry. No rash noted.  Psychiatric: She has a normal mood and affect. Her behavior is normal.          Assessment & Plan:   Cannot exclude early left maxillary sinusitis.  Patient has a history of allergic rhinitis and prior history of bacterial sinusitis.  Will treat with Augmentin for 7 days.  Will continue Augmentin.  Will treat with a 7 days of prednisone Migraine headaches

## 2014-04-29 NOTE — Telephone Encounter (Signed)
Patient Information:  Caller Name: Dreyah  Phone: [phone]  Patient: Gabriela Shepard  Gender: Female  DOB: February 11, 1961  Age: 53 Years  PCP: Carolann Littler (Family Practice)  Pregnant: No  Office Follow Up:  Does the office need to follow up with this patient?: No  Instructions For The Office: N/A   Symptoms  Reason For Call & Symptoms: Typical sinus congestion started a week ago and had been using Flonase.  Notes tickle in throat.  Thursday 11/19 had headache on left side of head, tried various meds but did not help.  Even after migraine Rx headache came back and it usually stays away after Rx.  Even walking feels like  ice pack over left eye.  Has gotten worske, nose very clogged up.  Reviewed Health History In EMR: Yes  Reviewed Medications In EMR: Yes  Reviewed Allergies In EMR: Yes  Reviewed Surgeries / Procedures: Yes  Date of Onset of Symptoms: 04/22/2014  Treatments Tried: nettie pot only helps short time.  Tried Mucinex, Advil Sinus OTC, Allergra  Treatments Tried Worked: No OB / GYN:  LMP: Unknown  Guideline(s) Used:  Sinus Pain and Congestion  Disposition Per Guideline:   See Today or Tomorrow in Office  Reason For Disposition Reached:   Using nasal washes and pain medicine > 24 hours and sinus pain (lower forehead, cheekbone, or eye) persists  Advice Given:  N/A  Patient Will Follow Care Advice:  YES  Appointment Scheduled:  04/29/2014 13:00:00 Appointment Scheduled Provider:  Bluford Kaufmann (Family Practice > 60yrs old)

## 2014-04-30 ENCOUNTER — Ambulatory Visit: Payer: BC Managed Care – PPO

## 2014-04-30 ENCOUNTER — Ambulatory Visit: Payer: BC Managed Care – PPO | Admitting: Family Medicine

## 2014-06-10 ENCOUNTER — Encounter: Payer: Self-pay | Admitting: Family Medicine

## 2014-06-10 ENCOUNTER — Ambulatory Visit (INDEPENDENT_AMBULATORY_CARE_PROVIDER_SITE_OTHER): Payer: BLUE CROSS/BLUE SHIELD | Admitting: Family Medicine

## 2014-06-10 ENCOUNTER — Ambulatory Visit (INDEPENDENT_AMBULATORY_CARE_PROVIDER_SITE_OTHER): Payer: BLUE CROSS/BLUE SHIELD

## 2014-06-10 VITALS — BP 118/76 | HR 68 | Temp 97.6°F | Wt 145.0 lb

## 2014-06-10 DIAGNOSIS — Z23 Encounter for immunization: Secondary | ICD-10-CM

## 2014-06-10 DIAGNOSIS — J0111 Acute recurrent frontal sinusitis: Secondary | ICD-10-CM

## 2014-06-10 MED ORDER — LEVOFLOXACIN 500 MG PO TABS: 500.0000 mg | ORAL_TABLET | Freq: Every day | ORAL | Status: DC

## 2014-06-10 NOTE — Progress Notes (Signed)
   Subjective:    Patient ID: Gabriela Shepard, female    DOB: Oct 31, 1960, 53 y.o.   MRN: [id number]  HPI  Here with acute sinus issues. About 3 months ago she started developing some sinus congestion and headaches. She was treated with amoxicillin prednisone back in November. She did not feel much improvement. Back [facility] and [facility] Levaquin and did feel better afterwards. After being off antibiotics about a week should also recurrent headaches and frontal sinus pressure. She is using Netty pot with some bloody date drainage off and on. Intermittent headaches. Some cough. Intermittent sore throat.  Review of Systems  Constitutional: Negative for fever and chills.  HENT: Positive for congestion and sinus pressure.   Respiratory: Positive for cough.   Neurological: Positive for headaches.       Objective:   Physical Exam  Constitutional: She appears well-developed and well-nourished.  HENT:  Right Ear: External ear normal.  Left Ear: External ear normal.  Mouth/Throat: Oropharynx is clear and moist.  Neck: Neck supple.  Cardiovascular: Normal rate and regular rhythm.   Pulmonary/Chest: Effort normal and breath sounds normal. No respiratory distress. She has no wheezes. She has no rales.  Lymphadenopathy:    She has no cervical adenopathy.          Assessment & Plan:  Acute recurrent versus chronic sinusitis. 1 more course of Levaquin 500 milligrams daily for 10 days. Consider limited CT of sinuses if no better after that

## 2014-06-10 NOTE — Patient Instructions (Signed)

## 2014-06-19 ENCOUNTER — Telehealth: Payer: Self-pay | Admitting: Family Medicine

## 2014-06-19 ENCOUNTER — Other Ambulatory Visit: Payer: Self-pay | Admitting: Family Medicine

## 2014-06-19 DIAGNOSIS — J321 Chronic frontal sinusitis: Secondary | ICD-10-CM

## 2014-06-19 NOTE — Telephone Encounter (Signed)
Pt is aware. CT is ordered.

## 2014-06-19 NOTE — Telephone Encounter (Signed)
We need to go ahead and get limited CT of sinuses to further assess, as discussed.

## 2014-06-19 NOTE — Telephone Encounter (Signed)
Pt last round of abx did not work. Pt would like to know the next step ?ct scan. Pt was seen on 1-4 for sinus infection

## 2014-06-21 ENCOUNTER — Ambulatory Visit (INDEPENDENT_AMBULATORY_CARE_PROVIDER_SITE_OTHER)
Admission: RE | Admit: 2014-06-21 | Discharge: 2014-06-21 | Disposition: A | Discharge status: Home / Self Care | Payer: BLUE CROSS/BLUE SHIELD | Source: Ambulatory Visit | Attending: Family Medicine | Admitting: Family Medicine

## 2014-06-21 ENCOUNTER — Telehealth: Payer: Self-pay | Admitting: Family Medicine

## 2014-06-21 DIAGNOSIS — J321 Chronic frontal sinusitis: Secondary | ICD-10-CM

## 2014-06-21 NOTE — Telephone Encounter (Signed)
Pt had ct scan this morning and would like results and whats the next step?

## 2014-06-21 NOTE — Telephone Encounter (Signed)
Patient informed. 

## 2014-07-01 ENCOUNTER — Other Ambulatory Visit: Payer: Self-pay | Admitting: Family Medicine

## 2014-07-01 NOTE — Telephone Encounter (Signed)
Last visit 06/10/14 Last refill 10/22/13 #90 2 refill

## 2014-07-01 NOTE — Telephone Encounter (Signed)
Refill both OK. 

## 2014-07-04 ENCOUNTER — Other Ambulatory Visit: Payer: Self-pay | Admitting: Family Medicine

## 2014-08-21 ENCOUNTER — Other Ambulatory Visit (INDEPENDENT_AMBULATORY_CARE_PROVIDER_SITE_OTHER): Payer: BLUE CROSS/BLUE SHIELD

## 2014-08-21 DIAGNOSIS — Z0000 Encounter for general adult medical examination without abnormal findings: Secondary | ICD-10-CM

## 2014-08-21 LAB — HEPATIC FUNCTION PANEL
ALK PHOS: 52 U/L (ref 39–117)
ALT: 14 U/L (ref 0–35)
AST: 19 U/L (ref 0–37)
Albumin: 4.3 g/dL (ref 3.5–5.2)
BILIRUBIN DIRECT: 0.1 mg/dL (ref 0.0–0.3)
TOTAL PROTEIN: 7.2 g/dL (ref 6.0–8.3)
Total Bilirubin: 0.4 mg/dL (ref 0.2–1.2)

## 2014-08-21 LAB — CBC WITH DIFFERENTIAL/PLATELET
BASOS PCT: 0.5 % (ref 0.0–3.0)
Basophils Absolute: 0 10*3/uL (ref 0.0–0.1)
EOS ABS: 0.1 10*3/uL (ref 0.0–0.7)
Eosinophils Relative: 2.4 % (ref 0.0–5.0)
HCT: 40.7 % (ref 36.0–46.0)
HEMOGLOBIN: 14 g/dL (ref 12.0–15.0)
LYMPHS ABS: 1.1 10*3/uL (ref 0.7–4.0)
Lymphocytes Relative: 28.7 % (ref 12.0–46.0)
MCHC: 34.4 g/dL (ref 30.0–36.0)
MCV: 86.5 fl (ref 78.0–100.0)
Monocytes Absolute: 0.6 10*3/uL (ref 0.1–1.0)
Monocytes Relative: 16.5 % — ABNORMAL HIGH (ref 3.0–12.0)
NEUTROS ABS: 2 10*3/uL (ref 1.4–7.7)
NEUTROS PCT: 51.9 % (ref 43.0–77.0)
Platelets: 285 10*3/uL (ref 150.0–400.0)
RBC: 4.71 Mil/uL (ref 3.87–5.11)
RDW: 12.5 % (ref 11.5–15.5)
WBC: 3.8 10*3/uL — ABNORMAL LOW (ref 4.0–10.5)

## 2014-08-21 LAB — LIPID PANEL
Cholesterol: 134 mg/dL (ref 0–200)
HDL: 55.8 mg/dL (ref >39.00)
LDL Cholesterol: 70 mg/dL (ref 0–99)
NonHDL: 78.2
TRIGLYCERIDES: 42 mg/dL (ref 0.0–149.0)
Total CHOL/HDL Ratio: 2
VLDL: 8.4 mg/dL (ref 0.0–40.0)

## 2014-08-21 LAB — BASIC METABOLIC PANEL
BUN: 7 mg/dL (ref 6–23)
CALCIUM: 9.2 mg/dL (ref 8.4–10.5)
CO2: 29 mEq/L (ref 19–32)
CREATININE: 0.8 mg/dL (ref 0.40–1.20)
Chloride: 90 mEq/L — ABNORMAL LOW (ref 96–112)
GFR: 79.54 mL/min (ref >60.00)
GLUCOSE: 82 mg/dL (ref 70–99)
Potassium: 4.3 mEq/L (ref 3.5–5.1)
Sodium: 125 mEq/L — ABNORMAL LOW (ref 135–145)

## 2014-08-21 LAB — TSH: TSH: 2.62 u[IU]/mL (ref 0.35–4.50)

## 2014-08-28 ENCOUNTER — Other Ambulatory Visit: Payer: Self-pay

## 2014-09-04 ENCOUNTER — Ambulatory Visit (INDEPENDENT_AMBULATORY_CARE_PROVIDER_SITE_OTHER): Payer: BLUE CROSS/BLUE SHIELD | Admitting: Family Medicine

## 2014-09-04 ENCOUNTER — Encounter: Payer: Self-pay | Admitting: Family Medicine

## 2014-09-04 VITALS — BP 112/78 | HR 82 | Temp 98.2°F | Wt 146.0 lb

## 2014-09-04 DIAGNOSIS — Z0000 Encounter for general adult medical examination without abnormal findings: Secondary | ICD-10-CM

## 2014-09-04 DIAGNOSIS — M25551 Pain in right hip: Secondary | ICD-10-CM | POA: Diagnosis not present

## 2014-09-04 DIAGNOSIS — J309 Allergic rhinitis, unspecified: Secondary | ICD-10-CM | POA: Diagnosis not present

## 2014-09-04 DIAGNOSIS — E871 Hypo-osmolality and hyponatremia: Secondary | ICD-10-CM

## 2014-09-04 LAB — BASIC METABOLIC PANEL
BUN: 10 mg/dL (ref 6–23)
CHLORIDE: 98 meq/L (ref 96–112)
CO2: 33 mEq/L — ABNORMAL HIGH (ref 19–32)
Calcium: 9.5 mg/dL (ref 8.4–10.5)
Creatinine, Ser: 0.75 mg/dL (ref 0.40–1.20)
GFR: 85.68 mL/min (ref >60.00)
Glucose, Bld: 106 mg/dL — ABNORMAL HIGH (ref 70–99)
Potassium: 3.8 mEq/L (ref 3.5–5.1)
Sodium: 133 mEq/L — ABNORMAL LOW (ref 135–145)

## 2014-09-04 MED ORDER — ESTRADIOL 1 MG PO TABS: 1.0000 mg | ORAL_TABLET | Freq: Every day | ORAL | Status: DC

## 2014-09-04 NOTE — Patient Instructions (Signed)
Schedule repeat mammogram We will call you regarding Allergist and Orthopedist appointments.

## 2014-09-04 NOTE — Progress Notes (Signed)
Subjective:    Patient ID: Gabriela Shepard, female    DOB: 1960-11-28, 54 y.o.   MRN: [id number]  HPI Patient here for complete physical.  She has history of depression, migraine headaches, remote history of alcohol abuse. She's had some ongoing right hip issues and has seen orthopedist during the past year. She had x-rays which showed moderate degenerative changes. She has been limited in physical activity because of this. She is requesting referral to another orthopedist at this time.  Postmenopausal with frequent hot flashes. She has used estradiol patch and requesting change to tablet at this time. She needs follow-up mammogram. Colonoscopy up-to-date. Immunizations up-to-date.  Frequent perennial allergic symptoms. Requesting referral to allergist. She uses Zyrtec but has frequent breakthrough symptoms.  Past Medical History  Diagnosis Date  . Depression   . Urine incontinence   . History of UTI   . History of alcohol abuse    Past Surgical History  Procedure Laterality Date  . Incontinence surgery      reports that she quit smoking about 9 years ago. Her smoking use included Cigarettes. She has a 5 pack-year smoking history. She has never used smokeless tobacco. She reports that she does not drink alcohol or use illicit drugs. family history includes Alcohol abuse in her other; Arthritis in her other; Hypertension in her mother and other; Mental illness in her other. There is no history of Colon cancer. Allergies  Allergen Reactions  . Sulfa Antibiotics     hives      Review of Systems  Constitutional: Negative for fever, activity change, appetite change, fatigue and unexpected weight change.  HENT: Positive for congestion. Negative for ear pain, hearing loss, sore throat and trouble swallowing.   Eyes: Negative for visual disturbance.  Respiratory: Negative for cough and shortness of breath.   Cardiovascular: Negative for chest pain and palpitations.    Gastrointestinal: Negative for abdominal pain, diarrhea, constipation and blood in stool.  Genitourinary: Negative for dysuria and hematuria.  Musculoskeletal: Positive for arthralgias (Right hip and left knee). Negative for myalgias and back pain.  Skin: Negative for rash.  Neurological: Negative for dizziness, syncope and headaches.  Hematological: Negative for adenopathy.  Psychiatric/Behavioral: Negative for confusion and dysphoric mood.       Objective:   Physical Exam  Constitutional: She is oriented to person, place, and time. She appears well-developed and well-nourished.  HENT:  Head: Normocephalic and atraumatic.  Eyes: EOM are normal. Pupils are equal, round, and reactive to light.  Neck: Normal range of motion. Neck supple. No thyromegaly present.  Cardiovascular: Normal rate, regular rhythm and normal heart sounds.   No murmur heard. Pulmonary/Chest: Breath sounds normal. No respiratory distress. She has no wheezes. She has no rales.  Abdominal: Soft. Bowel sounds are normal. She exhibits no distension and no mass. There is no tenderness. There is no rebound and no guarding.  Genitourinary:  Breasts are symmetric with no mass  Musculoskeletal: Normal range of motion. She exhibits no edema.  Lymphadenopathy:    She has no cervical adenopathy.  Neurological: She is alert and oriented to person, place, and time. She displays normal reflexes. No cranial nerve deficit.  Skin: No rash noted.  Psychiatric: She has a normal mood and affect. Her behavior is normal. Judgment and thought content normal.          Assessment & Plan:  Complete physical. Labs reviewed. She has significant hyponatremia with sodium 125. No diuretic use. She does take Prozac and  we explained that sometimes hyponatremia can be related to SSRI medications. Repeat basic metabolic panel today. If still low consider further evaluation of hyponatremia and consider discontinue fluoxetine. Patient requesting  referral to orthopedist and allergist for issues above and this will be handled. Switch from estradiol patch to oral per her preference. She has significant hot flashes and vaginal drying and no contraindications for use. Schedule repeat mammography. Colonoscopy up-to-date.

## 2014-09-04 NOTE — Progress Notes (Signed)
Pre visit review using our clinic review tool, if applicable. No additional management support is needed unless otherwise documented below in the visit note. 

## 2014-09-11 ENCOUNTER — Other Ambulatory Visit: Payer: Self-pay | Admitting: Family Medicine

## 2014-09-12 NOTE — Telephone Encounter (Signed)
Due April 25 th.  Is she getting through local pharmacy?  Should be due as above.  May refill for 6 months at appropriate due date.

## 2014-09-13 NOTE — Telephone Encounter (Signed)
Sometimes pt takes 2 /day.  Always takes at night. So pt will need before the 25th. Next week is ok.  [facility] [address]

## 2014-09-13 NOTE — Telephone Encounter (Signed)
OK to refill next week but try to take once daily.

## 2014-10-16 ENCOUNTER — Encounter: Payer: Self-pay | Admitting: Family Medicine

## 2014-11-12 ENCOUNTER — Telehealth: Payer: Self-pay

## 2014-11-12 NOTE — Telephone Encounter (Signed)
Left detailed message on personal cell concerning mammogram.

## 2014-12-16 ENCOUNTER — Telehealth: Payer: Self-pay | Admitting: Family Medicine

## 2014-12-16 ENCOUNTER — Other Ambulatory Visit: Payer: Self-pay | Admitting: Family Medicine

## 2014-12-16 DIAGNOSIS — R519 Headache, unspecified: Secondary | ICD-10-CM

## 2014-12-16 DIAGNOSIS — R51 Headache: Secondary | ICD-10-CM

## 2014-12-16 DIAGNOSIS — Z1231 Encounter for screening mammogram for malignant neoplasm of breast: Secondary | ICD-10-CM

## 2014-12-16 NOTE — Telephone Encounter (Signed)
Pt continues to have migraines, and would like you to refer her to someone for this. Pt states you told her if these migraines continued, she would get referral. They seem to be worse.   Pt would like to know if you could put [facility] to schedule her mamm[facility].

## 2014-12-16 NOTE — Telephone Encounter (Signed)
Referrals are ordered.

## 2014-12-16 NOTE — Telephone Encounter (Signed)
Let's offer her follow up with [facility] to further assess her headaches.

## 2014-12-16 NOTE — Telephone Encounter (Signed)
Last visit 09/04/14 Last refill 09/16/14 #90 0 refill

## 2014-12-17 NOTE — Telephone Encounter (Signed)
Refill with one additional refill. 

## 2015-01-14 ENCOUNTER — Other Ambulatory Visit: Payer: Self-pay | Admitting: Family Medicine

## 2015-01-21 ENCOUNTER — Other Ambulatory Visit: Payer: Self-pay | Admitting: Family Medicine

## 2015-01-30 ENCOUNTER — Other Ambulatory Visit: Payer: Self-pay | Admitting: Family Medicine

## 2015-01-30 ENCOUNTER — Telehealth: Payer: Self-pay | Admitting: Family Medicine

## 2015-01-30 MED ORDER — SUMATRIPTAN SUCCINATE 100 MG PO TABS: ORAL_TABLET | ORAL | Status: DC

## 2015-01-30 NOTE — Telephone Encounter (Signed)
Rx sent to pharmacy   

## 2015-01-30 NOTE — Telephone Encounter (Signed)
Pt request refill of the following: SUMAtriptan (IMITREX) 100 MG tablet  Pt said she out of this med and is going out of town   Phamacy:  [facility]

## 2015-02-11 ENCOUNTER — Ambulatory Visit: Payer: BLUE CROSS/BLUE SHIELD | Admitting: Neurology

## 2015-02-16 ENCOUNTER — Other Ambulatory Visit: Payer: Self-pay | Admitting: Family Medicine

## 2015-02-19 ENCOUNTER — Encounter: Payer: Self-pay | Admitting: Family Medicine

## 2015-02-19 ENCOUNTER — Ambulatory Visit (INDEPENDENT_AMBULATORY_CARE_PROVIDER_SITE_OTHER): Payer: BLUE CROSS/BLUE SHIELD | Admitting: Family Medicine

## 2015-02-19 VITALS — BP 110/74 | HR 98 | Temp 98.2°F | Wt 144.2 lb

## 2015-02-19 DIAGNOSIS — K589 Irritable bowel syndrome without diarrhea: Secondary | ICD-10-CM

## 2015-02-19 DIAGNOSIS — F329 Major depressive disorder, single episode, unspecified: Secondary | ICD-10-CM | POA: Diagnosis not present

## 2015-02-19 DIAGNOSIS — Z23 Encounter for immunization: Secondary | ICD-10-CM | POA: Diagnosis not present

## 2015-02-19 DIAGNOSIS — F32A Depression, unspecified: Secondary | ICD-10-CM

## 2015-02-19 MED ORDER — FLUOXETINE HCL 40 MG PO CAPS: 40.0000 mg | ORAL_CAPSULE | Freq: Every day | ORAL | Status: DC

## 2015-02-19 MED ORDER — SUMATRIPTAN SUCCINATE 100 MG PO TABS: ORAL_TABLET | ORAL | Status: DC

## 2015-02-19 MED ORDER — HYOSCYAMINE SULFATE ER 0.375 MG PO TB12: 0.3750 mg | ORAL_TABLET | Freq: Two times a day (BID) | ORAL | Status: DC

## 2015-02-19 NOTE — Progress Notes (Signed)
   Subjective:    Patient ID: Gabriela Shepard, female    DOB: [birth year], 54 y.o.   MRN: [id number]  HPI Patient here to discuss the following issues  History of recurrent depression. She has history of alcohol abuse but has been clean now for several years and has ongoing counseling as well as a sponsor that follows her regularly. Her sister died recently of 13 years of age from heroin overdose and alcohol abuse. This has been very difficult. She's had increased crying spells. No suicidal ideation. Depressed mood. Difficulties concentrating. Currently takes Prozac 20 mg daily and Wellbutrin 300 mg daily  Second issue is she has history of IBS type symptoms with alternating constipation and diarrhea. With her recent stress she's had increased abdominal spasms. She has taken Levbid in the past which helped . Requesting refills. No appetite or weight changes.  Past Medical History  Diagnosis Date  . Depression   . Urine incontinence   . History of UTI   . History of alcohol abuse    Past Surgical History  Procedure Laterality Date  . Incontinence surgery      reports that she quit smoking about 9 years ago. Her smoking use included Cigarettes. She has a 5 pack-year smoking history. She has never used smokeless tobacco. She reports that she does not drink alcohol or use illicit drugs. family history includes Alcohol abuse in her other; Arthritis in her other; Hypertension in her mother and other; Mental illness in her other. There is no history of Colon cancer. Allergies  Allergen Reactions  . Sulfa Antibiotics     hives      Review of Systems  Constitutional: Negative for appetite change and unexpected weight change.  Respiratory: Negative for cough.   Cardiovascular: Negative for chest pain.  Psychiatric/Behavioral: Positive for sleep disturbance and dysphoric mood. Negative for suicidal ideas.       Objective:   Physical Exam  Constitutional: She is oriented to person,  place, and time. She appears well-developed and well-nourished.  Neck: Neck supple. No thyromegaly present.  Cardiovascular: Normal rate and regular rhythm.   Pulmonary/Chest: Effort normal and breath sounds normal. No respiratory distress. She has no wheezes. She has no rales.  Musculoskeletal: She exhibits no edema.  Neurological: She is alert and oriented to person, place, and time. No cranial nerve deficit.  Psychiatric: She has a normal mood and affect. Her behavior is normal. Judgment and thought content normal.          Assessment & Plan:  #1 history of recurrent depression. Recent increased symptoms as above. Titrate Prozac 40 mg daily. Continue with Wellbutrin. Continue regular counseling. Consider addition of low-dose Abilify if symptoms not improving with the above #2 probable IBS. Recent GI symptoms as above. Refill Levbid for as needed use #3 health maintenance. Flu vaccine given. Patient also requesting shingles vaccine. No contraindications.

## 2015-02-19 NOTE — Progress Notes (Signed)
Pre visit review using our clinic review tool, if applicable. No additional management support is needed unless otherwise documented below in the visit note. 

## 2015-04-01 ENCOUNTER — Ambulatory Visit: Payer: BLUE CROSS/BLUE SHIELD

## 2015-04-08 ENCOUNTER — Ambulatory Visit: Payer: BLUE CROSS/BLUE SHIELD

## 2015-06-03 ENCOUNTER — Other Ambulatory Visit: Payer: Self-pay | Admitting: Family Medicine

## 2015-06-04 NOTE — Telephone Encounter (Signed)
Refill OK

## 2015-06-04 NOTE — Telephone Encounter (Signed)
Pt last visit 02/19/15 Pt last Rx refill 12/17/14 #90 with 1 refills

## 2015-06-09 ENCOUNTER — Other Ambulatory Visit: Payer: Self-pay | Admitting: Family Medicine

## 2015-07-09 ENCOUNTER — Telehealth: Payer: Self-pay | Admitting: Family Medicine

## 2015-07-09 ENCOUNTER — Encounter: Payer: Self-pay | Admitting: Family Medicine

## 2015-07-09 ENCOUNTER — Ambulatory Visit (INDEPENDENT_AMBULATORY_CARE_PROVIDER_SITE_OTHER): Payer: BLUE CROSS/BLUE SHIELD | Admitting: Family Medicine

## 2015-07-09 VITALS — BP 90/60 | HR 83 | Temp 97.6°F | Wt 150.1 lb

## 2015-07-09 DIAGNOSIS — G43909 Migraine, unspecified, not intractable, without status migrainosus: Secondary | ICD-10-CM

## 2015-07-09 MED ORDER — HYDROCODONE-ACETAMINOPHEN 5-325 MG PO TABS: 1.0000 | ORAL_TABLET | ORAL | Status: DC | PRN

## 2015-07-09 MED ORDER — PREDNISONE 20 MG PO TABS: ORAL_TABLET | ORAL | Status: DC

## 2015-07-09 NOTE — Progress Notes (Signed)
   Subjective:    Patient ID: Gabriela Shepard, female    DOB: Feb 07, 1961, 55 y.o.   MRN: [id number]  HPI Patient seen with "migraine "headache. She has long history of migraines. She has been recently having about 2-3 per month. She actually is scheduled to see neurology in March. Her current headache started yesterday morning and is left temporal and spread to left frontal region and is typical of prior migraines. She has throbbing quality 7 out of 10 severity with some photosensitivity. She has not had any vomiting. She has had some mild nausea. She taken 2 Imitrex yesterday without relief. She also had similar migraine late last week that lasted almost 3 days and she took a total of 4 Imitrex which did not work. She had previously taken Topamax but did not tolerate this secondary side effects. She is currently not taking any preventatives.  She saw gynecologist recently and had IUD removed   Review of Systems  Constitutional: Negative for appetite change and unexpected weight change.  Respiratory: Negative for shortness of breath.   Cardiovascular: Negative for chest pain.  Neurological: Positive for headaches. Negative for dizziness.       Objective:   Physical Exam  Constitutional: She is oriented to person, place, and time. She appears well-developed and well-nourished.  Eyes: Pupils are equal, round, and reactive to light.  Neck: Neck supple.  Cardiovascular: Normal rate and regular rhythm.   Pulmonary/Chest: Effort normal and breath sounds normal. No respiratory distress. She has no wheezes. She has no rales.  Neurological: She is alert and oriented to person, place, and time. No cranial nerve deficit.          Assessment & Plan:  Migraine headache. Patient has taken Imitrex without relief. We wrote for limited hydrocodone 5 mg 1-2 every 4 hours for severe headache to hopefully help break current headache. We also wrote for prednisone 20 mg 2 tabs daily for 5 days. She is  encouraged to keep follow-up with neurology to explore other preventative options

## 2015-07-09 NOTE — Telephone Encounter (Signed)
Patient has an appt today with Dr. Elease Hashimoto. Thanks!

## 2015-07-09 NOTE — Patient Instructions (Signed)

## 2015-07-09 NOTE — Telephone Encounter (Signed)
[facility] - Client [facility] Patient Name: Gabriela Shepard DOB: May 24, 1961 Initial Comment Caller States: I am having a migraine, to the point of going to the hospital Nurse Assessment Nurse: Mechele Dawley, RN, Amy Date/Time Eilene Ghazi Time): 07/09/2015 12:25:28 PM Confirm and document reason for call. If symptomatic, describe symptoms. You must click the next button to save text entered. ---SHE STATES THAT SHE IS HAVING MIGRAINES. SHE STATES THAT SHE COULD NOT GO TO SLEEP. SHE STATES THAT SHE IS AT WORK AND SHE IS STARTING BACK UP AGAIN. 4/10. LAST NIGHT IT WAS AT A 100. SHE STATES. SHE HAS ALREADY TAKEN HER IMITREX 2 TABS. SHE STATES THAT SHE CAN NOT GET ANY RELIEF. NAUSEA OR VISUAL DISTURBANCES. SHE STATES THAT SHE WAS GIVEN SOME PHENERGAN LAST NIGHT AND IT DID NOT HELP LAST NIGHT. Has the patient traveled out of the country within the last 30 days? ---Not Applicable Does the patient have any new or worsening symptoms? ---Yes Will a triage be completed? ---Yes Related visit to physician within the last 2 weeks? ---No Does the PT have any chronic conditions? (i.e. diabetes, asthma, etc.) ---Yes List chronic conditions. ---PLEASE EPIC Did the patient indicate they were pregnant? ---No Is this a behavioral health or substance abuse call? ---No Guidelines Guideline Title Affirmed Question Affirmed Notes Headache [1] SEVERE headache (e.g., excruciating) AND [2] not improved after 2 hours of pain medicine Final Disposition User See Physician within 4 Hours (or PCP triage) Mechele Dawley, RN, Amy Referrals REFERRED TO PCP OFFICE Disagree/Comply: Comply

## 2015-07-09 NOTE — Progress Notes (Signed)
Pre visit review using our clinic review tool, if applicable. No additional management support is needed unless otherwise documented below in the visit note. 

## 2015-07-15 ENCOUNTER — Other Ambulatory Visit: Payer: Self-pay | Admitting: Adult Health

## 2015-07-15 ENCOUNTER — Other Ambulatory Visit: Payer: Self-pay | Admitting: Obstetrics & Gynecology

## 2015-07-15 ENCOUNTER — Telehealth: Payer: Self-pay | Admitting: Family Medicine

## 2015-07-15 DIAGNOSIS — R928 Other abnormal and inconclusive findings on diagnostic imaging of breast: Secondary | ICD-10-CM

## 2015-07-15 MED ORDER — ELETRIPTAN HYDROBROMIDE 40 MG PO TABS: 40.0000 mg | ORAL_TABLET | ORAL | Status: DC | PRN

## 2015-07-15 MED ORDER — RIZATRIPTAN BENZOATE 10 MG PO TABS: 10.0000 mg | ORAL_TABLET | ORAL | Status: DC | PRN

## 2015-07-15 MED ORDER — PREDNISONE 20 MG PO TABS: ORAL_TABLET | ORAL | Status: DC

## 2015-07-15 NOTE — Telephone Encounter (Signed)
Maxalt 10 mg sent to pharmacy

## 2015-07-15 NOTE — Telephone Encounter (Signed)
Pt states she is in the beginning of another migraine. Her specicialist appointment is not until March 3rd. Pt took an SUMAtriptan (IMITREX) 100 MG tablet, but that is not working. Would like to know what she should do or if another round of meds is appropriate.  this is the 3rd migraine in 4 weeks.  [facility] [address]

## 2015-07-15 NOTE — Telephone Encounter (Signed)
Pt returned call; spoke with pt and pt is aware.

## 2015-07-15 NOTE — Telephone Encounter (Signed)
Prednisone did work well for her. She only uses Hydrocodone prn with relief  and believes she has some left at home but unsure. Pt has never tried relpax and would be willing to try.

## 2015-07-15 NOTE — Telephone Encounter (Signed)
Did she respond to the prednisone and hydrocodone?   Find out if she has tried Relpax previously.

## 2015-07-15 NOTE — Telephone Encounter (Signed)
Stop Imitrex .  Try Relpax 40 mg one at onset of migraine and repeat once in 2 hours prn #6.  May refill prednisone 20 mg two daily for 3 days.  #6.

## 2015-07-15 NOTE — Telephone Encounter (Signed)
Left a detailed message for pt that maxalt was sent to the pharmacy.

## 2015-07-15 NOTE — Addendum Note (Signed)
Addended by: Colleen Can on: 07/15/2015 04:14 PM   Modules accepted: Orders, Medications

## 2015-07-15 NOTE — Telephone Encounter (Signed)
Left a message for pt to return call. Rx sent to pharmacy.

## 2015-07-15 NOTE — Telephone Encounter (Signed)
Pt returned call and states the replax cost $400 and with a coupon it came down to $200 dollars.  Pt states she cannot afford this and needs another medication sent in to the pharmacy.

## 2015-07-16 ENCOUNTER — Ambulatory Visit
Admission: RE | Admit: 2015-07-16 | Discharge: 2015-07-16 | Disposition: A | Discharge status: Home / Self Care | Payer: BLUE CROSS/BLUE SHIELD | Source: Ambulatory Visit | Attending: Obstetrics & Gynecology | Admitting: Obstetrics & Gynecology

## 2015-07-16 ENCOUNTER — Other Ambulatory Visit: Payer: Self-pay | Admitting: Obstetrics & Gynecology

## 2015-07-16 DIAGNOSIS — N6320 Unspecified lump in the left breast, unspecified quadrant: Secondary | ICD-10-CM

## 2015-07-16 DIAGNOSIS — R2232 Localized swelling, mass and lump, left upper limb: Secondary | ICD-10-CM

## 2015-07-16 DIAGNOSIS — R928 Other abnormal and inconclusive findings on diagnostic imaging of breast: Secondary | ICD-10-CM

## 2015-07-22 ENCOUNTER — Other Ambulatory Visit: Payer: Self-pay | Admitting: Obstetrics & Gynecology

## 2015-07-22 DIAGNOSIS — N6320 Unspecified lump in the left breast, unspecified quadrant: Secondary | ICD-10-CM

## 2015-07-23 ENCOUNTER — Ambulatory Visit
Admission: RE | Admit: 2015-07-23 | Discharge: 2015-07-23 | Disposition: A | Discharge status: Home / Self Care | Payer: BLUE CROSS/BLUE SHIELD | Source: Ambulatory Visit | Attending: Obstetrics & Gynecology | Admitting: Obstetrics & Gynecology

## 2015-07-23 DIAGNOSIS — R2232 Localized swelling, mass and lump, left upper limb: Secondary | ICD-10-CM

## 2015-07-23 DIAGNOSIS — N6320 Unspecified lump in the left breast, unspecified quadrant: Secondary | ICD-10-CM

## 2015-08-06 ENCOUNTER — Ambulatory Visit: Payer: BLUE CROSS/BLUE SHIELD | Admitting: Family Medicine

## 2015-08-18 ENCOUNTER — Telehealth: Payer: Self-pay | Admitting: Family Medicine

## 2015-08-18 NOTE — Telephone Encounter (Signed)
Pt states she was supposed to fly out of town today. 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 Pt has flight insurance, but they will not accept unless they have a doctors note. All doctor note needs to say pt is not medically able to travel. Pt states she is very upset, her sobriety is at risk, and anxiety is very high .  Pt would like to know if you will please write her a dr note. Pt states she has had several health issues lately as well, and this issue with her property has really stressed her out. Please advise if Dr Elease Hashimoto will write.

## 2015-08-20 NOTE — Telephone Encounter (Signed)
i tried to contact her but mailbox was full for her mobile phone.  Let her know i will do letter (if you can reach her) and route back to me.

## 2015-08-21 NOTE — Telephone Encounter (Signed)
Pt is aware md will do letter. Pt has clear her mailbox

## 2015-08-24 NOTE — Telephone Encounter (Signed)
Letter done

## 2015-08-25 NOTE — Telephone Encounter (Signed)
Pt is aware that letter is up front for pick up.  

## 2015-08-25 NOTE — Telephone Encounter (Signed)
Letter printed for signature

## 2015-09-05 ENCOUNTER — Ambulatory Visit (INDEPENDENT_AMBULATORY_CARE_PROVIDER_SITE_OTHER): Payer: BLUE CROSS/BLUE SHIELD | Admitting: Family Medicine

## 2015-09-05 ENCOUNTER — Encounter: Payer: Self-pay | Admitting: Family Medicine

## 2015-09-05 VITALS — BP 123/76 | HR 77 | Temp 98.3°F

## 2015-09-05 DIAGNOSIS — S40012A Contusion of left shoulder, initial encounter: Secondary | ICD-10-CM

## 2015-09-05 DIAGNOSIS — S5002XA Contusion of left elbow, initial encounter: Secondary | ICD-10-CM | POA: Diagnosis not present

## 2015-09-05 DIAGNOSIS — S7002XA Contusion of left hip, initial encounter: Secondary | ICD-10-CM

## 2015-09-05 MED ORDER — METHOCARBAMOL 750 MG PO TABS: 750.0000 mg | ORAL_TABLET | Freq: Four times a day (QID) | ORAL | Status: DC | PRN

## 2015-09-05 NOTE — Progress Notes (Signed)
   Subjective:    Patient ID: Gabriela Shepard, female    DOB: 03/31/1961, 55 y.o.   MRN: [id number]  HPI Here to check injuries from a fall several hours ago. She was in a local store when she stepped onto a freshly waxed floor and her foot slipped out from beneath her. She fell and landed on the left side of her body. She did not strike her head. She has taken Ibuprofen but still very stiff and sore, particularly overt the left shoulder, left elbow, and left hip areas.    Review of Systems  Constitutional: Negative.   Respiratory: Negative.   Cardiovascular: Negative.   Musculoskeletal: Positive for myalgias and arthralgias.  Neurological: Negative.        Objective:   Physical Exam  Constitutional: She is oriented to person, place, and time. She appears well-developed and well-nourished. No distress.  Cardiovascular: Normal rate, regular rhythm, normal heart sounds and intact distal pulses.   Pulmonary/Chest: Effort normal and breath sounds normal.  Musculoskeletal:  Tender over the left shoulder, left elbow, and left lateral hip areas. No bruising is seen. ROM is full.   Neurological: She is alert and oriented to person, place, and time. No cranial nerve deficit. She exhibits normal muscle tone. Coordination normal.          Assessment & Plan:  Contusions to left shoulder, elbow, and hip. Use Ibuprofen, Robaxin, and ice prn. Recheck prn

## 2015-09-05 NOTE — Progress Notes (Signed)
Pre visit review using our clinic review tool, if applicable. No additional management support is needed unless otherwise documented below in the visit note. Pt declined to weigh 

## 2015-09-08 NOTE — Progress Notes (Signed)
   Subjective:    Patient ID: Gabriela Shepard, female    DOB: 03/08/1961, 55 y.o.   MRN: [id number]  HPI    Review of Systems     Objective:   Physical Exam        Assessment & Plan:  Laurey Morale, MD

## 2015-09-12 ENCOUNTER — Other Ambulatory Visit: Payer: Self-pay | Admitting: Family Medicine

## 2015-09-15 ENCOUNTER — Ambulatory Visit (INDEPENDENT_AMBULATORY_CARE_PROVIDER_SITE_OTHER): Payer: BLUE CROSS/BLUE SHIELD | Admitting: Family Medicine

## 2015-09-15 VITALS — BP 100/80 | HR 76 | Temp 97.8°F | Ht 64.0 in | Wt 149.0 lb

## 2015-09-15 DIAGNOSIS — M545 Low back pain, unspecified: Secondary | ICD-10-CM

## 2015-09-15 DIAGNOSIS — R202 Paresthesia of skin: Secondary | ICD-10-CM | POA: Diagnosis not present

## 2015-09-15 MED ORDER — MELOXICAM 15 MG PO TABS: 15.0000 mg | ORAL_TABLET | Freq: Every day | ORAL | Status: DC

## 2015-09-15 NOTE — Patient Instructions (Signed)
Low Back Sprain With Rehab A sprain is an injury in which a ligament is torn. The ligaments of the lower back are vulnerable to sprains. However, they are strong and require great force to be injured. These ligaments are important for stabilizing the spinal column. Sprains are classified into three categories. Grade 1 sprains cause pain, but the tendon is not lengthened. Grade 2 sprains include a lengthened ligament, due to the ligament being stretched or partially ruptured. With grade 2 sprains there is still function, although the function may be decreased. Grade 3 sprains involve a complete tear of the tendon or muscle, and function is usually impaired. SYMPTOMS   Severe pain in the lower back.  Sometimes, a feeling of a "pop," "snap," or tear, at the time of injury.  Tenderness and sometimes swelling at the injury site.  Uncommonly, bruising (contusion) within 48 hours of injury.  Muscle spasms in the back. CAUSES  Low back sprains occur when a force is placed on the ligaments that is greater than they can handle. Common causes of injury include:  Performing a stressful act while off-balance.  Repetitive stressful activities that involve movement of the lower back.  Direct hit (trauma) to the lower back. RISK INCREASES WITH:  Contact sports (football, wrestling).  Collisions (major skiing accidents).  Sports that require throwing or lifting (baseball, weightlifting).  Sports involving twisting of the spine (gymnastics, diving, tennis, golf).  Poor strength and flexibility.  Inadequate protection.  Previous back injury or surgery (especially fusion). PREVENTION  Wear properly fitted and padded protective equipment.  Warm up and stretch properly before activity.  Allow for adequate recovery between workouts.  Maintain physical fitness:  Strength, flexibility, and endurance.  Cardiovascular fitness.  Maintain a healthy body weight. PROGNOSIS  If treated properly,  low back sprains usually heal with non-surgical treatment. The length of time for healing depends on the severity of the injury.  RELATED COMPLICATIONS   Recurring symptoms, resulting in a chronic problem.  Chronic inflammation and pain in the low back.  Delayed healing or resolution of symptoms, especially if activity is resumed too soon.  Prolonged impairment.  Unstable or arthritic joints of the low back. TREATMENT  Treatment first involves the use of ice and medicine, to reduce pain and inflammation. The use of strengthening and stretching exercises may help reduce pain with activity. These exercises may be performed at home or with a therapist. Severe injuries may require referral to a therapist for further evaluation and treatment, such as ultrasound. Your caregiver may advise that you wear a back brace or corset, to help reduce pain and discomfort. Often, prolonged bed rest results in greater harm then benefit. Corticosteroid injections may be recommended. However, these should be reserved for the most serious cases. It is important to avoid using your back when lifting objects. At night, sleep on your back on a firm mattress, with a pillow placed under your knees. If non-surgical treatment is unsuccessful, surgery may be needed.  MEDICATION   If pain medicine is needed, nonsteroidal anti-inflammatory medicines (aspirin and ibuprofen), or other minor pain relievers (acetaminophen), are often advised.  Do not take pain medicine for 7 days before surgery.  Prescription pain relievers may be given, if your caregiver thinks they are needed. Use only as directed and only as much as you need.  Ointments applied to the skin may be helpful.  Corticosteroid injections may be given by your caregiver. These injections should be reserved for the most serious cases, because   they may only be given a certain number of times. HEAT AND COLD  Cold treatment (icing) should be applied for 10 to 15  minutes every 2 to 3 hours for inflammation and pain, and immediately after activity that aggravates your symptoms. Use ice packs or an ice massage.  Heat treatment may be used before performing stretching and strengthening activities prescribed by your caregiver, physical therapist, or athletic trainer. Use a heat pack or a warm water soak. SEEK MEDICAL CARE IF:   Symptoms get worse or do not improve in 2 to 4 weeks, despite treatment.  You develop numbness or weakness in either leg.  You lose bowel or bladder function.  Any of the following occur after surgery: fever, increased pain, swelling, redness, drainage of fluids, or bleeding in the affected area.  New, unexplained symptoms develop. (Drugs used in treatment may produce side effects.) EXERCISES  RANGE OF MOTION (ROM) AND STRETCHING EXERCISES - Low Back Sprain Most people with lower back pain will find that their symptoms get worse with excessive bending forward (flexion) or arching at the lower back (extension). The exercises that will help resolve your symptoms will focus on the opposite motion.  Your physician, physical therapist or athletic trainer will help you determine which exercises will be most helpful to resolve your lower back pain. Do not complete any exercises without first consulting with your caregiver. Discontinue any exercises which make your symptoms worse, until you speak to your caregiver. If you have pain, numbness or tingling which travels down into your buttocks, leg or foot, the goal of the therapy is for these symptoms to move closer to your back and eventually resolve. Sometimes, these leg symptoms will get better, but your lower back pain may worsen. This is often an indication of progress in your rehabilitation. Be very alert to any changes in your symptoms and the activities in which you participated in the 24 hours prior to the change. Sharing this information with your caregiver will allow him or her to most  efficiently treat your condition. These exercises may help you when beginning to rehabilitate your injury. Your symptoms may resolve with or without further involvement from your physician, physical therapist or athletic trainer. While completing these exercises, remember:   Restoring tissue flexibility helps normal motion to return to the joints. This allows healthier, less painful movement and activity.  An effective stretch should be held for at least 30 seconds.  A stretch should never be painful. You should only feel a gentle lengthening or release in the stretched tissue. FLEXION RANGE OF MOTION AND STRETCHING EXERCISES: STRETCH - Flexion, Single Knee to Chest   Lie on a firm bed or floor with both legs extended in front of you.  Keeping one leg in contact with the floor, bring your opposite knee to your chest. Hold your leg in place by either grabbing behind your thigh or at your knee.  Pull until you feel a gentle stretch in your low back. Hold __________ seconds.  Slowly release your grasp and repeat the exercise with the opposite side. Repeat __________ times. Complete this exercise __________ times per day.  STRETCH - Flexion, Double Knee to Chest  Lie on a firm bed or floor with both legs extended in front of you.  Keeping one leg in contact with the floor, bring your opposite knee to your chest.  Tense your stomach muscles to support your back and then lift your other knee to your chest. Hold your legs in   place by either grabbing behind your thighs or at your knees.  Pull both knees toward your chest until you feel a gentle stretch in your low back. Hold __________ seconds.  Tense your stomach muscles and slowly return one leg at a time to the floor. Repeat __________ times. Complete this exercise __________ times per day.  STRETCH - Low Trunk Rotation  Lie on a firm bed or floor. Keeping your legs in front of you, bend your knees so they are both pointed toward the  ceiling and your feet are flat on the floor.  Extend your arms out to the side. This will stabilize your upper body by keeping your shoulders in contact with the floor.  Gently and slowly drop both knees together to one side until you feel a gentle stretch in your low back. Hold for __________ seconds.  Tense your stomach muscles to support your lower back as you bring your knees back to the starting position. Repeat the exercise to the other side. Repeat __________ times. Complete this exercise __________ times per day  EXTENSION RANGE OF MOTION AND FLEXIBILITY EXERCISES: STRETCH - Extension, Prone on Elbows   Lie on your stomach on the floor, a bed will be too soft. Place your palms about shoulder width apart and at the height of your head.  Place your elbows under your shoulders. If this is too painful, stack pillows under your chest.  Allow your body to relax so that your hips drop lower and make contact more completely with the floor.  Hold this position for __________ seconds.  Slowly return to lying flat on the floor. Repeat __________ times. Complete this exercise __________ times per day.  RANGE OF MOTION - Extension, Prone Press Ups  Lie on your stomach on the floor, a bed will be too soft. Place your palms about shoulder width apart and at the height of your head.  Keeping your back as relaxed as possible, slowly straighten your elbows while keeping your hips on the floor. You may adjust the placement of your hands to maximize your comfort. As you gain motion, your hands will come more underneath your shoulders.  Hold this position __________ seconds.  Slowly return to lying flat on the floor. Repeat __________ times. Complete this exercise __________ times per day.  RANGE OF MOTION- Quadruped, Neutral Spine   Assume a hands and knees position on a firm surface. Keep your hands under your shoulders and your knees under your hips. You may place padding under your knees for  comfort.  Drop your head and point your tailbone toward the ground below you. This will round out your lower back like an angry cat. Hold this position for __________ seconds.  Slowly lift your head and release your tail bone so that your back sags into a large arch, like an old horse.  Hold this position for __________ seconds.  Repeat this until you feel limber in your low back.  Now, find your "sweet spot." This will be the most comfortable position somewhere between the two previous positions. This is your neutral spine. Once you have found this position, tense your stomach muscles to support your low back.  Hold this position for __________ seconds. Repeat __________ times. Complete this exercise __________ times per day.  STRENGTHENING EXERCISES - Low Back Sprain These exercises may help you when beginning to rehabilitate your injury. These exercises should be done near your "sweet spot." This is the neutral, low-back arch, somewhere between fully rounded and   fully arched, that is your least painful position. When performed in this safe range of motion, these exercises can be used for people who have either a flexion or extension based injury. These exercises may resolve your symptoms with or without further involvement from your physician, physical therapist or athletic trainer. While completing these exercises, remember:   Muscles can gain both the endurance and the strength needed for everyday activities through controlled exercises.  Complete these exercises as instructed by your physician, physical therapist or athletic trainer. Increase the resistance and repetitions only as guided.  You may experience muscle soreness or fatigue, but the pain or discomfort you are trying to eliminate should never worsen during these exercises. If this pain does worsen, stop and make certain you are following the directions exactly. If the pain is still present after adjustments, discontinue the  exercise until you can discuss the trouble with your caregiver. STRENGTHENING - Deep Abdominals, Pelvic Tilt   Lie on a firm bed or floor. Keeping your legs in front of you, bend your knees so they are both pointed toward the ceiling and your feet are flat on the floor.  Tense your lower abdominal muscles to press your low back into the floor. This motion will rotate your pelvis so that your tail bone is scooping upwards rather than pointing at your feet or into the floor. With a gentle tension and even breathing, hold this position for __________ seconds. Repeat __________ times. Complete this exercise __________ times per day.  STRENGTHENING - Abdominals, Crunches   Lie on a firm bed or floor. Keeping your legs in front of you, bend your knees so they are both pointed toward the ceiling and your feet are flat on the floor. Cross your arms over your chest.  Slightly tip your chin down without bending your neck.  Tense your abdominals and slowly lift your trunk high enough to just clear your shoulder blades. Lifting higher can put excessive stress on the lower back and does not further strengthen your abdominal muscles.  Control your return to the starting position. Repeat __________ times. Complete this exercise __________ times per day.  STRENGTHENING - Quadruped, Opposite UE/LE Lift   Assume a hands and knees position on a firm surface. Keep your hands under your shoulders and your knees under your hips. You may place padding under your knees for comfort.  Find your neutral spine and gently tense your abdominal muscles so that you can maintain this position. Your shoulders and hips should form a rectangle that is parallel with the floor and is not twisted.  Keeping your trunk steady, lift your right hand no higher than your shoulder and then your left leg no higher than your hip. Make sure you are not holding your breath. Hold this position for __________ seconds.  Continuing to keep  your abdominal muscles tense and your back steady, slowly return to your starting position. Repeat with the opposite arm and leg. Repeat __________ times. Complete this exercise __________ times per day.  STRENGTHENING - Abdominals and Quadriceps, Straight Leg Raise   Lie on a firm bed or floor with both legs extended in front of you.  Keeping one leg in contact with the floor, bend the other knee so that your foot can rest flat on the floor.  Find your neutral spine, and tense your abdominal muscles to maintain your spinal position throughout the exercise.  Slowly lift your straight leg off the floor about 6 inches for a count of   15, making sure to not hold your breath.  Still keeping your neutral spine, slowly lower your leg all the way to the floor. Repeat this exercise with each leg __________ times. Complete this exercise __________ times per day. POSTURE AND BODY MECHANICS CONSIDERATIONS - Low Back Sprain Keeping correct posture when sitting, standing or completing your activities will reduce the stress put on different body tissues, allowing injured tissues a chance to heal and limiting painful experiences. The following are general guidelines for improved posture. Your physician or physical therapist will provide you with any instructions specific to your needs. While reading these guidelines, remember:  The exercises prescribed by your provider will help you have the flexibility and strength to maintain correct postures.  The correct posture provides the best environment for your joints to work. All of your joints have less wear and tear when properly supported by a spine with good posture. This means you will experience a healthier, less painful body.  Correct posture must be practiced with all of your activities, especially prolonged sitting and standing. Correct posture is as important when doing repetitive low-stress activities (typing) as it is when doing a single heavy-load  activity (lifting). RESTING POSITIONS Consider which positions are most painful for you when choosing a resting position. If you have pain with flexion-based activities (sitting, bending, stooping, squatting), choose a position that allows you to rest in a less flexed posture. You would want to avoid curling into a fetal position on your side. If your pain worsens with extension-based activities (prolonged standing, working overhead), avoid resting in an extended position such as sleeping on your stomach. Most people will find more comfort when they rest with their spine in a more neutral position, neither too rounded nor too arched. Lying on a non-sagging bed on your side with a pillow between your knees, or on your back with a pillow under your knees will often provide some relief. Keep in mind, being in any one position for a prolonged period of time, no matter how correct your posture, can still lead to stiffness. PROPER SITTING POSTURE In order to minimize stress and discomfort on your spine, you must sit with correct posture. Sitting with good posture should be effortless for a healthy body. Returning to good posture is a gradual process. Many people can work toward this most comfortably by using various supports until they have the flexibility and strength to maintain this posture on their own. When sitting with proper posture, your ears will fall over your shoulders and your shoulders will fall over your hips. You should use the back of the chair to support your upper back. Your lower back will be in a neutral position, just slightly arched. You may place a small pillow or folded towel at the base of your lower back for  support.  When working at a desk, create an environment that supports good, upright posture. Without extra support, muscles tire, which leads to excessive strain on joints and other tissues. Keep these recommendations in mind: CHAIR:  A chair should be able to slide under your desk  when your back makes contact with the back of the chair. This allows you to work closely.  The chair's height should allow your eyes to be level with the upper part of your monitor and your hands to be slightly lower than your elbows. BODY POSITION  Your feet should make contact with the floor. If this is not possible, use a foot rest.  Keep your ears   over your shoulders. This will reduce stress on your neck and low back. INCORRECT SITTING POSTURES  If you are feeling tired and unable to assume a healthy sitting posture, do not slouch or slump. This puts excessive strain on your back tissues, causing more damage and pain. Healthier options include:  Using more support, like a lumbar pillow.  Switching tasks to something that requires you to be upright or walking.  Talking a brief walk.  Lying down to rest in a neutral-spine position. PROLONGED STANDING WHILE SLIGHTLY LEANING FORWARD  When completing a task that requires you to lean forward while standing in one place for a long time, place either foot up on a stationary 2-4 inch high object to help maintain the best posture. When both feet are on the ground, the lower back tends to lose its slight inward curve. If this curve flattens (or becomes too large), then the back and your other joints will experience too much stress, tire more quickly, and can cause pain. CORRECT STANDING POSTURES Proper standing posture should be assumed with all daily activities, even if they only take a few moments, like when brushing your teeth. As in sitting, your ears should fall over your shoulders and your shoulders should fall over your hips. You should keep a slight tension in your abdominal muscles to brace your spine. Your tailbone should point down to the ground, not behind your body, resulting in an over-extended swayback posture.  INCORRECT STANDING POSTURES  Common incorrect standing postures include a forward head, locked knees and/or an excessive  swayback. WALKING Walk with an upright posture. Your ears, shoulders and hips should all line-up. PROLONGED ACTIVITY IN A FLEXED POSITION When completing a task that requires you to bend forward at your waist or lean over a low surface, try to find a way to stabilize 3 out of 4 of your limbs. You can place a hand or elbow on your thigh or rest a knee on the surface you are reaching across. This will provide you more stability, so that your muscles do not tire as quickly. By keeping your knees relaxed, or slightly bent, you will also reduce stress across your lower back. CORRECT LIFTING TECHNIQUES DO :  Assume a wide stance. This will provide you more stability and the opportunity to get as close as possible to the object which you are lifting.  Tense your abdominals to brace your spine. Bend at the knees and hips. Keeping your back locked in a neutral-spine position, lift using your leg muscles. Lift with your legs, keeping your back straight.  Test the weight of unknown objects before attempting to lift them.  Try to keep your elbows locked down at your sides in order get the best strength from your shoulders when carrying an object.  Always ask for help when lifting heavy or awkward objects. INCORRECT LIFTING TECHNIQUES DO NOT:   Lock your knees when lifting, even if it is a small object.  Bend and twist. Pivot at your feet or move your feet when needing to change directions.  Assume that you can safely pick up even a paperclip without proper posture.   This information is not intended to replace advice given to you by your health care provider. Make sure you discuss any questions you have with your health care provider.   Document Released: 05/24/2005 Document Revised: 06/14/2014 Document Reviewed: 09/05/2008 [employer] Patient Education 2016 [employer]  

## 2015-09-15 NOTE — Progress Notes (Signed)
Pre visit review using our clinic review tool, if applicable. No additional management support is needed unless otherwise documented below in the visit note. 

## 2015-09-15 NOTE — Progress Notes (Signed)
Subjective:    Patient ID: Gabriela Shepard, female    DOB: [birth year], 55 y.o.   MRN: [id number]  HPI Acute Visit  Patient seen today with a complaint of low back pain post fall injury September 05, 2015.  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 She reports landing more on her left side, did not hit her head.  Was seen in office shortly after fall and treated with muscle relaxant.  She also has received two massages with mild relief of symptoms. Today she presents with persistent low back pain with radiation to left upper hip. Denies radiation down her leg.  Rates pain as 4-8/10.  She is taking ibuprofen regularly for pain relief.   Right Arm Paresthesias   Patient describes intermittent episodes of numbness and tingling in her right arm with radiation to distal fingers of right hand. Occurs more often at night. She is able to relieve the arm discomfort  with repositioning of arm. She is uncertain of how long this has been occurring and whether or not these symptoms coincide with her fall. She reports rarely noticing during the day. No lower extremity symptoms.  Past Medical History  Diagnosis Date  . Depression   . Urine incontinence   . History of UTI   . History of alcohol abuse    Past Surgical History  Procedure Laterality Date  . Incontinence surgery      reports that she quit smoking about 10 years ago. Her smoking use included Cigarettes. She has a 5 pack-year smoking history. She has never used smokeless tobacco. She reports that she does not drink alcohol or use illicit drugs. family history includes Alcohol abuse in her other; Arthritis in her other; Hypertension in her mother and other; Mental illness in her other. There is no history of Colon cancer. Allergies  Allergen Reactions  . Sulfa Antibiotics     hives      Review of Systems  Constitutional: Negative.   HENT: Negative.   Respiratory: Negative.   Cardiovascular: Negative.   Musculoskeletal:  Positive for back pain. Negative for neck stiffness.       See HPI       Objective:   Physical Exam  Constitutional: She is oriented to person, place, and time. She appears well-developed and well-nourished.  Musculoskeletal: Normal range of motion. She exhibits tenderness.  Tenderness with palpation in L3-L4 region lumbar spine. Full ROM  Neurological: She is alert and oriented to person, place, and time. She has normal reflexes. She displays normal reflexes. She exhibits normal muscle tone. Coordination normal.  Skin: Skin is warm and dry.  Psychiatric: She has a normal mood and affect. Her behavior is normal. Judgment and thought content normal.          Assessment & Plan:  Low Back Pain- Patient presents today with pain in the lower lumbar region of her back. She reports tenderness with palpation in the region approximate to the  L3-L4 region. Pain is worst with sitting upright, relieved by bending forward, and drawing legs close to abdomen. No evidence of radiculopathy or sciatica. Low back pain etiology likely musculoskeletal.  Plan: Obtain X-ray of lumbar spine Take Meloxicam 15 mg as needed for pain. Patient given instruction on Low Back Rehab Exercises Consider PT if no better in 2 weeks.  Right Arm Paresthesias-Numbness and tingling is occuring mostly at night, persists for approximately 60 seconds or less.  Resolves with positional changes. Likely nerve compression which resolves  with repositioning of the limb.  Plan:  Follow-up if problems persists or worsens.

## 2015-09-17 ENCOUNTER — Ambulatory Visit (INDEPENDENT_AMBULATORY_CARE_PROVIDER_SITE_OTHER)
Admission: RE | Admit: 2015-09-17 | Discharge: 2015-09-17 | Disposition: A | Discharge status: Home / Self Care | Payer: BLUE CROSS/BLUE SHIELD | Source: Ambulatory Visit | Attending: Family Medicine | Admitting: Family Medicine

## 2015-09-17 DIAGNOSIS — M545 Low back pain, unspecified: Secondary | ICD-10-CM

## 2015-09-17 DIAGNOSIS — S3992XA Unspecified injury of lower back, initial encounter: Secondary | ICD-10-CM | POA: Diagnosis not present

## 2015-09-23 ENCOUNTER — Other Ambulatory Visit: Payer: Self-pay | Admitting: Family Medicine

## 2015-09-23 DIAGNOSIS — M549 Dorsalgia, unspecified: Secondary | ICD-10-CM

## 2015-09-24 ENCOUNTER — Encounter: Payer: Self-pay | Admitting: Family Medicine

## 2015-09-25 ENCOUNTER — Ambulatory Visit: Payer: BLUE CROSS/BLUE SHIELD | Attending: Family Medicine

## 2015-09-25 DIAGNOSIS — M545 Low back pain, unspecified: Secondary | ICD-10-CM

## 2015-09-25 DIAGNOSIS — M546 Pain in thoracic spine: Secondary | ICD-10-CM | POA: Diagnosis not present

## 2015-09-25 DIAGNOSIS — R293 Abnormal posture: Secondary | ICD-10-CM | POA: Insufficient documentation

## 2015-09-25 DIAGNOSIS — R252 Cramp and spasm: Secondary | ICD-10-CM

## 2015-09-25 NOTE — Patient Instructions (Signed)
Angry Cat Stretch  Tuck chin and tighten stomach, arching back. Repeat __5-10__ times per set.  Do _1-2___ sessions per day. Child Pose   Sitting on knees, fold body over legs and relax head and arms on floor. Hold for _20___ breaths.  Do 3 reps.  1-2 times a day.  Copyright  VHI. All rights reserved.  Side Waist Stretch from Child's Pose  From child's pose, walk hands to left. Reach right hand out on diagonal. Reach hips back toward heels making a C with torso. Breathe into right side waist. Hold for _20___ breaths. Repeat _3___ times each side.  1-2 times a day.  Copyright  VHI. All rights reserved.    Cervico-Thoracic: Extension / Rotation (Sitting)    Reach across body with left arm and grasp back of chair. Gently look over right side shoulder. Hold _20___ seconds. Relax. Repeat __3__ times per set. Do __3__ sets per session. Do __3__ sessions per day.  http://orth.exer.us/981   Copyright  VHI. All rights reserved.   Posture - Standing   Good posture is important. Avoid slouching and forward head thrust. Maintain curve in low back and align ears over shoulders, hips over ankles.  Pull your belly button in toward your back bone. Posture Tips DO: - stand tall and erect - keep chin tucked in - keep head and shoulders in alignment - check posture regularly in mirror or large window - pull head back against headrest in car seat;  Change your position often.  Sit with lumbar support. DON'T: - slouch or slump while watching TV or reading - sit, stand or lie in one position  for too long;  Sitting is especially hard on the spine so if you sit at a desk/use the computer, then stand up often! Copyright  VHI. All rights reserved.  Posture - Sitting  Sit upright, head facing forward. Try using a roll to support lower back. Keep shoulders relaxed, and avoid rounded back. Keep hips level with knees. Avoid crossing legs for long periods. Copyright  VHI. All rights  reserved.  Chronic neck strain can develop because of poor posture and faulty work habits  Postural strain related to slumped sitting and forward head posture is a leading cause of headaches, neck and upper back pain  General strengthening and flexibility exercises are helpful in the treatment of neck pain.  Most importantly, you should learn to correct the posture that may be contributing to chronic pain.   Change positions frequently  Change your work or home environment to improve posture and mechanics.   [facility] [address] [place], [place] [address] Phone # [phone] Fax [phone]

## 2015-09-25 NOTE — Therapy (Addendum)
[facility] [address], [place], [place], [address] Phone: [phone]   Fax:  [phone]  Physical Therapy Evaluation  Patient Details  Name: Gabriela Shepard MRN: [id number] Date of Birth: [birth year] Referring Provider: Carolann Littler, MD  Encounter Date: 09/25/2015      PT End of Session - 09/25/15 1520    Visit Number 1   Date for PT Re-Evaluation 11/20/15   PT Start Time 1450   PT Stop Time 1525   PT Time Calculation (min) 35 min   Activity Tolerance Patient tolerated treatment well   Behavior During Therapy [facility] for tasks assessed/performed      Past Medical History  Diagnosis Date  . Depression   . Urine incontinence   . History of UTI   . History of alcohol abuse     Past Surgical History  Procedure Laterality Date  . Incontinence surgery      There were no vitals filed for this visit.       Subjective Assessment - 09/25/15 1447    Subjective Pt reports to PT with thoracic and lumbar pain that began with fall 09/05/15.  Pt reports that she fell forward landing on her hands and then landed on her side.  Pt reports Rt UE radiculopathy and spasm in her Rt scapula. Pt also reports that she feels weakn since the fall.    Pertinent History Fall 09/05/15-tripped wearing new shoes making transition from carpet to hard floor   Limitations Sitting;Standing;Walking   How long can you sit comfortably? 5-10 minutes   How long can you stand comfortably? gets tired easily- 15 minutes   How long can you walk comfortably? 15 minutes   Diagnostic tests x-ray: negative for injury.  DDD in spine per pt report   Patient Stated Goals reduce back pain, get rid of spasm in Rt shoulder blade, improve strength   Currently in Pain? Yes   Pain Score 6    Pain Location Back   Pain Orientation Right;Lower;Mid   Pain Descriptors / Indicators Aching;Dull;Numbness   Pain Type Acute pain   Pain Radiating Towards Rt UE  numbness   Pain Onset More than a month ago   Pain Frequency Constant   Aggravating Factors  sitting still, standing long periods, computer work, activity   Pain Relieving Factors muscle relaxers, medication            [facility] PT Assessment - 09/25/15 0001    Assessment   Medical Diagnosis pain, upper back (M54.6)   Referring Provider Carolann Littler, MD   Onset Date/Surgical Date 09/05/15   Hand Dominance Right   Next MD Visit none schedule    Precautions   Precautions None   Restrictions   Weight Bearing Restrictions No   Balance Screen   Has the patient fallen in the past 6 months Yes   How many times? 1  slick floor   Has the patient had a decrease in activity level because of a fear of falling?  No   Is the patient reluctant to leave their home because of a fear of falling?  No   Home Environment   Living Environment Private residence   Type of [place]   Prior Function   Level of Independence Independent   Vocation Full time employment   English as a second language teacher, Environmental health practitioner- desk work   Leisure gardening, walking, biking, hiking   Cognition   Overall Cognitive Status Within Functional Limits for tasks  assessed   Observation/Other Assessments   Focus on Therapeutic Outcomes (FOTO)  64% limitation   Posture/Postural Control   Posture/Postural Control Postural limitations   Postural Limitations Rounded Shoulders;Forward head   Posture Comments substitution in sitting with significant slouching   ROM / Strength   AROM / PROM / Strength AROM;Strength   AROM   Overall AROM  Within functional limits for tasks performed   Overall AROM Comments cervical and lumbar AROM with Rt thoracic pain reported with lumbar sidebending.  Rt UT pain with Lt sidebending.     Strength   Overall Strength Deficits   Overall Strength Comments UE strength 4/5 to 4+/5, LE strength   Palpation   Spinal mobility reduce thoracic mobility without pain, no pain with lumbar  mobility. Reduced mobility by 50%   Palpation comment pt with active palpalbe tension, pain and trigger point over Rt rhomboids and middle trapezius   Ambulation/Gait   Ambulation/Gait Yes   Ambulation/Gait Assistance 7: Independent   Gait Pattern Within Functional Limits                           PT Education - 09/25/15 1933    Education provided Yes   Education Details HEP: quadruped flexibility, seated thoracolumbar stretch, posture education   Person(s) Educated Patient   Methods Explanation;Demonstration;Handout   Comprehension Verbalized understanding;Returned demonstration          PT Short Term Goals - 09/25/15 1530    PT SHORT TERM GOAL #1   Title be independent in initial HEP   Time 4   Period Weeks   Status New   PT SHORT TERM GOAL #2   Title verbalize and demonstrate neutral seated posture and report postural corrections with work at computer   Time 4   Period Weeks   Status New   PT SHORT TERM GOAL #3   Title report < or = to 4/10 thoracic and lumbar pain with sitting at work   Time 4   Period Weeks   Status New           PT Long Term Goals - 09/25/15 1444    PT LONG TERM GOAL #1   Title be independent in advanced HEP   Time 8   Period Weeks   Status New   PT LONG TERM GOAL #2   Title reduce FOTO to < or = to 39% limitation   Time 8   Period Weeks   Status New   PT LONG TERM GOAL #3   Title report a 60% reduction in the frequency and intensity of lumbar and thoracic pain with ADLs and self-care   Time 8   Period Weeks   Status New   PT LONG TERM GOAL #4   Title sit for 1 hour at work with neutral posture without limitation   Time 8   Period Weeks   Status New   PT LONG TERM GOAL #5   Title stand and walk for 1 hour without limitation to allow for return to walking and hiking   Time 8   Period Weeks   Status New               Plan - 09/25/15 1525    Clinical Impression Statement Pt presents to PT with  complaints of Rt sided thoracic pain, Rt UE radiculopathy and bil. LBP that began after fall 09/05/15.  Imaging showed DDD in the spine per pt report.  Pt demonstrates poor seated posture, weakness in UEs, reduced mobilty in the lumbar and thoracic spine and active trigger point in the Rt medial scapular musculatre (rhomboids and middle trap).  FOTO score is 64% limitation.  Pt will benefit from skilled PT for manual, dry needling, postural reeducation and strength, flexiblity and modalities as needed.     Rehab Potential Good   PT Frequency 2x / week   PT Duration 8 weeks   PT Treatment/Interventions ADLs/Self Care Home Management;Cryotherapy;Electrical Stimulation;Moist Heat;Therapeutic exercise;Therapeutic activities;Functional mobility training;Ultrasound;Neuromuscular re-education;Patient/family education;Manual techniques;Taping;Dry needling;Passive range of motion   PT Next Visit Plan Dry needling, [place], manual to Rt thorcic spine, postural strength, flexiblity   Consulted and Agree with Plan of Care Patient      Patient will benefit from skilled therapeutic intervention in order to improve the following deficits and impairments:  Pain, Postural dysfunction, Decreased strength, Impaired flexibility, Improper body mechanics, Decreased activity tolerance, Increased muscle spasms  Visit Diagnosis: Pain in thoracic spine - Plan: PT plan of care cert/re-cert  Cramp and spasm - Plan: PT plan of care cert/re-cert  Posture abnormality - Plan: PT plan of care cert/re-cert  Bilateral low back pain without sciatica - Plan: PT plan of care cert/re-cert     Problem List Patient Active Problem List   Diagnosis Date Noted  . IBS (irritable bowel syndrome) 02/19/2015  . Postmenopausal 12/19/2013  . Migraine headache 12/05/2012  . Pain, upper back 09/24/2010  . Depression 09/04/2010    [name], PT 09/25/2015 7:34 PM  [facility] [address], [place], [place], [address] Phone: [phone]   Fax:  [phone]  Name: Gabriela Shepard MRN: [id number] Date of Birth: 06/08/1960

## 2015-09-30 ENCOUNTER — Ambulatory Visit: Payer: BLUE CROSS/BLUE SHIELD

## 2015-09-30 DIAGNOSIS — M546 Pain in thoracic spine: Secondary | ICD-10-CM

## 2015-09-30 DIAGNOSIS — M545 Low back pain, unspecified: Secondary | ICD-10-CM

## 2015-09-30 DIAGNOSIS — R252 Cramp and spasm: Secondary | ICD-10-CM

## 2015-09-30 DIAGNOSIS — R293 Abnormal posture: Secondary | ICD-10-CM | POA: Diagnosis not present

## 2015-09-30 NOTE — Patient Instructions (Signed)

## 2015-09-30 NOTE — Therapy (Signed)
[facility] [address] [place], [place], [address] Phone: [phone]   Fax:  [phone]  Physical Therapy Treatment  Patient Details  Name: Gabriela Shepard MRN: [id number] Date of Birth: 1960/10/23 Referring Provider: Carolann Littler, MD  Encounter Date: 09/30/2015      PT End of Session - 09/30/15 1145    Visit Number 2   Date for PT Re-Evaluation 11/20/15   PT Start Time [id number]   PT Stop Time 1159   PT Time Calculation (min) 57 min   Activity Tolerance Patient tolerated treatment well   Behavior During Therapy [facility] for tasks assessed/performed      Past Medical History  Diagnosis Date  . Depression   . Urine incontinence   . History of UTI   . History of alcohol abuse     Past Surgical History  Procedure Laterality Date  . Incontinence surgery      There were no vitals filed for this visit.      Subjective Assessment - 09/30/15 1107    Subjective Pt reports that she has not been doing exercises.  Rt sided muscle spasm was "enraged" over the weekend.     Patient Stated Goals reduce back pain, get rid of spasm in Rt shoulder blade, improve strength   Currently in Pain? Yes   Pain Score 6    Pain Location Back   Pain Orientation Right;Lower;Mid   Pain Descriptors / Indicators Aching;Dull;Numbness   Pain Radiating Towards Rt UE   Pain Onset More than a month ago   Pain Frequency Constant   Aggravating Factors  sitting still, standing long periods, computer work   Pain Relieving Factors muscle relaxers, medication                         [facility] Adult PT Treatment/Exercise - 09/30/15 0001    Exercises   Exercises Neck;Lumbar   Neck Exercises: Seated   Other Seated Exercise cervical AROM in all directions 3x20"    Lumbar Exercises: Quadruped   [place] Horse 10 reps   Other Quadruped Lumbar Exercises prayer and lateral prayer 3x20 seconds   Modalities   Modalities Moist  Heat   Moist Heat Therapy   Number Minutes Moist Heat 15 Minutes   Moist Heat Location Lumbar Spine   Manual Therapy   Manual Therapy Soft tissue mobilization;Myofascial release   Manual therapy comments soft tissue elongation to Rt rhomboids, middle traps, and UT on the Rt with trigger point release and elongation'          Trigger Point Dry Needling - 09/30/15 1117    Consent Given? Yes   Education Handout Provided Yes  pneumothorax precautions   Muscles Treated Upper Body Rhomboids;Upper trapezius;Subscapularis   Upper Trapezius Response Twitch reponse elicited;Palpable increased muscle length   Rhomboids Response Twitch response elicited;Palpable increased muscle length   Subscapularis Response Twitch response elicited;Palpable increased muscle length              PT Education - 09/30/15 1144    Education provided Yes   Education Details DN education   Person(s) Educated Patient   Methods Explanation;Demonstration;Handout   Comprehension Verbalized understanding;Returned demonstration          PT Short Term Goals - 09/30/15 1109    PT SHORT TERM GOAL #1   Title be independent in initial HEP   Time 4   Period Weeks   Status On-going   PT  SHORT TERM GOAL #2   Title verbalize and demonstrate neutral seated posture and report postural corrections with work at computer   Time 4   Period Weeks   Status On-going   PT SHORT TERM GOAL #3   Title report < or = to 4/10 thoracic and lumbar pain with sitting at work   Time 4   Period Weeks   Status On-going           PT Long Term Goals - 09/25/15 1444    PT LONG TERM GOAL #1   Title be independent in advanced HEP   Time 8   Period Weeks   Status New   PT LONG TERM GOAL #2   Title reduce FOTO to < or = to 39% limitation   Time 8   Period Weeks   Status New   PT LONG TERM GOAL #3   Title report a 60% reduction in the frequency and intensity of lumbar and thoracic pain with ADLs and self-care   Time 8    Period Weeks   Status New   PT LONG TERM GOAL #4   Title sit for 1 hour at work with neutral posture without limitation   Time 8   Period Weeks   Status New   PT LONG TERM GOAL #5   Title stand and walk for 1 hour without limitation to allow for return to walking and hiking   Time 8   Period Weeks   Status New               Plan - 09/30/15 1110    Clinical Impression Statement Pt with only 1 session after evaluation.  Pt reports that she has not been performing HEP due to not enough time.  Pt has been working on her posture.  Pt demonstrates poor seated posture in the clinic.  Pt with active trigger point over Rt medial scapular border (rhomboids and middle trap) and responded with increased muscle length and reduced tension after dry needling .  Pt will benefit from skilled PT for flexibility, manual, strength and postural retraining.     Rehab Potential Good   PT Frequency 2x / week   PT Duration 8 weeks   PT Treatment/Interventions ADLs/Self Care Home Management;Cryotherapy;Electrical Stimulation;Moist Heat;Therapeutic exercise;Therapeutic activities;Functional mobility training;Ultrasound;Neuromuscular re-education;Patient/family education;Manual techniques;Taping;Dry needling;Passive range of motion   PT Next Visit Plan assess respontse Dry needling, [place], manual to Rt thorcic spine, postural strength, flexiblity.  Postural strength.   Consulted and Agree with Plan of Care Patient      Patient will benefit from skilled therapeutic intervention in order to improve the following deficits and impairments:  Pain, Postural dysfunction, Decreased strength, Impaired flexibility, Improper body mechanics, Decreased activity tolerance, Increased muscle spasms  Visit Diagnosis: Pain in thoracic spine  Cramp and spasm  Posture abnormality  Bilateral low back pain without sciatica     Problem List Patient Active Problem List   Diagnosis Date Noted  . IBS (irritable bowel  syndrome) 02/19/2015  . Postmenopausal 12/19/2013  . Migraine headache 12/05/2012  . Pain, upper back 09/24/2010  . Depression 09/04/2010     [name], PT 09/30/2015 11:49 AM  [facility] [address], [place], [place], [address] Phone: [phone]   Fax:  [phone]  Name: Gabriela Shepard MRN: [id number] Date of Birth: Oct 29, 1960

## 2015-10-02 ENCOUNTER — Ambulatory Visit: Payer: BLUE CROSS/BLUE SHIELD | Admitting: Physical Therapy

## 2015-10-02 DIAGNOSIS — M545 Low back pain, unspecified: Secondary | ICD-10-CM

## 2015-10-02 DIAGNOSIS — R252 Cramp and spasm: Secondary | ICD-10-CM

## 2015-10-02 DIAGNOSIS — R293 Abnormal posture: Secondary | ICD-10-CM | POA: Diagnosis not present

## 2015-10-02 DIAGNOSIS — M546 Pain in thoracic spine: Secondary | ICD-10-CM | POA: Diagnosis not present

## 2015-10-02 NOTE — Therapy (Signed)
[facility] [address], [place], [place], [address] Phone: [phone]   Fax:  [phone]  Physical Therapy Treatment  Patient Details  Name: Gabriela Shepard MRN: [id number] Date of Birth: [birth year] Referring Provider: Carolann Littler, MD  Encounter Date: 10/02/2015      PT End of Session - 10/02/15 1023    Visit Number 3   Date for PT Re-Evaluation 11/20/15   PT Start Time 1018   PT Stop Time 1056   PT Time Calculation (min) 38 min   Activity Tolerance Patient tolerated treatment well   Behavior During Therapy [facility] for tasks assessed/performed      Past Medical History  Diagnosis Date  . Depression   . Urine incontinence   . History of UTI   . History of alcohol abuse     Past Surgical History  Procedure Laterality Date  . Incontinence surgery      There were no vitals filed for this visit.      Subjective Assessment - 10/02/15 1020    Subjective Last visit helped me. I was doing alot better since last night.  The knot feels like it is stuck to my shoulder blade and makes difficulty to breath.    Pertinent History Fall 09/05/15-tripped wearing new shoes making transition from carpet to hard floor   Limitations Sitting;Standing;Walking   How long can you sit comfortably? 5-10 minutes   How long can you stand comfortably? gets tired easily- 15 minutes   How long can you walk comfortably? 15 minutes   Diagnostic tests x-ray: negative for injury.  DDD in spine per pt report   Patient Stated Goals reduce back pain, get rid of spasm in Rt shoulder blade, improve strength   Currently in Pain? Yes   Pain Score 7    Pain Location Back   Pain Orientation Right;Lower;Mid   Pain Descriptors / Indicators Aching;Dull;Numbness   Pain Type Acute pain   Pain Radiating Towards right UE   Pain Onset More than a month ago   Pain Frequency Intermittent   Aggravating Factors  sitting sitll, standing for long  periods of time, computer work   Pain Relieving Factors muscle relaxers, medication                         [facility] Adult PT Treatment/Exercise - 10/02/15 0001    Neck Exercises: Seated   Other Seated Exercise cervical AROM in all directions 3x20"    Lumbar Exercises: Quadruped   [place] 10 reps   Other Quadruped Lumbar Exercises prayer and lateral prayer 3x20 seconds   Manual Therapy   Manual Therapy Soft tissue mobilization;Joint mobilization;Scapular mobilization;Myofascial release   Joint Mobilization T1-T8 for rotational mobilization, P-A glides grade 3, and rib mobiliization to righ tribcage in supine and rib cage to open  up the rib cage. sidely opening with breathng to right ribcage   Soft tissue mobilization ot right rhomboid, right upper trap, right interscapular muscles and righ tdiaphgram.    Myofascial Release tissue rolling of right intercapular area and upper trap   Scapular Mobilization medial border of right scapula in left sidely                PT Education - 10/02/15 1058    Education provided No          PT Short Term Goals - 09/30/15 1109    PT SHORT TERM GOAL #1   Title  be independent in initial HEP   Time 4   Period Weeks   Status On-going   PT SHORT TERM GOAL #2   Title verbalize and demonstrate neutral seated posture and report postural corrections with work at computer   Time 4   Period Weeks   Status On-going   PT SHORT TERM GOAL #3   Title report < or = to 4/10 thoracic and lumbar pain with sitting at work   Time 4   Period Weeks   Status On-going           PT Long Term Goals - 09/25/15 1444    PT LONG TERM GOAL #1   Title be independent in advanced HEP   Time 8   Period Weeks   Status New   PT LONG TERM GOAL #2   Title reduce FOTO to < or = to 39% limitation   Time 8   Period Weeks   Status New   PT LONG TERM GOAL #3   Title report a 60% reduction in the frequency and intensity of lumbar and  thoracic pain with ADLs and self-care   Time 8   Period Weeks   Status New   PT LONG TERM GOAL #4   Title sit for 1 hour at work with neutral posture without limitation   Time 8   Period Weeks   Status New   PT LONG TERM GOAL #5   Title stand and walk for 1 hour without limitation to allow for return to walking and hiking   Time 8   Period Weeks   Status New               Plan - 10/02/15 1059    Clinical Impression Statement Patient has improved rib cage mobility on the right and right diaphragm after visit.  Patient still has a trigger point in right medial scapular border. Patient will benefit from skilled therapy for flexibility, manual, strength, and postural retraining.    Rehab Potential Good   PT Frequency 2x / week   PT Duration 8 weeks   PT Treatment/Interventions ADLs/Self Care Home Management;Cryotherapy;Electrical Stimulation;Moist Heat;Therapeutic exercise;Therapeutic activities;Functional mobility training;Ultrasound;Neuromuscular re-education;Patient/family education;Manual techniques;Taping;Dry needling;Passive range of motion   PT Next Visit Plan assess respontse Dry needling, [place], manual to Rt thorcic spine, postural strength, flexiblity.  Postural strength.   PT Home Exercise Plan progress as needed   Recommended Other Services None   Consulted and Agree with Plan of Care Patient      Patient will benefit from skilled therapeutic intervention in order to improve the following deficits and impairments:  Pain, Postural dysfunction, Decreased strength, Impaired flexibility, Improper body mechanics, Decreased activity tolerance, Increased muscle spasms  Visit Diagnosis: Pain in thoracic spine  Cramp and spasm  Posture abnormality  Bilateral low back pain without sciatica     Problem List Patient Active Problem List   Diagnosis Date Noted  . IBS (irritable bowel syndrome) 02/19/2015  . Postmenopausal 12/19/2013  . Migraine headache 12/05/2012  .  Pain, upper back 09/24/2010  . Depression 09/04/2010    Earlie Counts, PT 10/02/2015 11:02 AM    [facility] [address], [place], [place], [address] Phone: [phone]   Fax:  [phone]  Name: AMEYALLI ELICKER MRN: [id number] Date of Birth: 11/12/1960

## 2015-10-06 ENCOUNTER — Ambulatory Visit: Payer: BLUE CROSS/BLUE SHIELD | Attending: Family Medicine

## 2015-10-06 DIAGNOSIS — R252 Cramp and spasm: Secondary | ICD-10-CM | POA: Insufficient documentation

## 2015-10-06 DIAGNOSIS — R293 Abnormal posture: Secondary | ICD-10-CM | POA: Insufficient documentation

## 2015-10-06 DIAGNOSIS — M546 Pain in thoracic spine: Secondary | ICD-10-CM | POA: Insufficient documentation

## 2015-10-06 NOTE — Therapy (Addendum)
[facility] [address] [place], [place], [address] Phone: [phone]   Fax:  [phone]  Physical Therapy Treatment  Patient Details  Name: DEMESHIA YOKE MRN: [id number] Date of Birth: [birth year] Referring Provider: Carolann Littler, MD  Encounter Date: 10/06/2015      PT End of Session - 10/06/15 1227    Visit Number 4   Date for PT Re-Evaluation 11/20/15   PT Start Time 1152  Pt late for appt   PT Stop Time 1245   PT Time Calculation (min) 53 min   Activity Tolerance Patient tolerated treatment well   Behavior During Therapy [facility] for tasks assessed/performed      Past Medical History  Diagnosis Date  . Depression   . Urine incontinence   . History of UTI   . History of alcohol abuse     Past Surgical History  Procedure Laterality Date  . Incontinence surgery      There were no vitals filed for this visit.      Subjective Assessment - 10/06/15 1154    Subjective Felt sore after last session but it really helped me.     Pertinent History Fall 09/05/15-tripped wearing new shoes making transition from carpet to hard floor   Currently in Pain? Yes   Pain Score 5    Pain Location Back   Pain Orientation Right;Mid;Lower   Pain Descriptors / Indicators Aching;Dull   Pain Type Acute pain   Pain Onset More than a month ago   Pain Frequency Intermittent   Aggravating Factors  sitting still, daily activity, computer use   Pain Relieving Factors medication, stretching                         [facility] Adult PT Treatment/Exercise - 10/06/15 0001    Modalities   Modalities Moist Heat;Electrical Stimulation   Moist Heat Therapy   Number Minutes Moist Heat 15 Minutes   Moist Heat Location Lumbar Spine;Other (comment)  thoracic   Electrical Stimulation   Electrical Stimulation Location Rt rhomboid and Rt thoracic multifidi   Electrical Stimulation Action Rt thoracic multifidus and  rhomboid with needles.  Constant attention/stabilization and rib block during treatment by PT (2-3 minutes). Low intensity.   Electrical Stimulation Parameters 3 minutes   Electrical Stimulation Goals Pain  trigger point   Manual Therapy   Manual Therapy Soft tissue mobilization;Myofascial release   Manual therapy comments trigger point release with elongation to Rt rhomboids and Rt UT          Trigger Point Dry Needling - 10/06/15 1355    Consent Given? Yes   Muscles Treated Upper Body Rhomboids;Upper trapezius;Subscapularis  Rt thoracic multifidus   Upper Trapezius Response Twitch reponse elicited;Palpable increased muscle length   Rhomboids Response Twitch response elicited;Palpable increased muscle length   Subscapularis Response Twitch response elicited;Palpable increased muscle length      Right only            PT Short Term Goals - 10/06/15 1155    PT SHORT TERM GOAL #1   Title be independent in initial HEP   Status Achieved   PT SHORT TERM GOAL #2   Title verbalize and demonstrate neutral seated posture and report postural corrections with work at computer   Status Achieved   PT [place] #3   Title report < or = to 4/10 thoracic and lumbar pain with sitting at work   Time  4   Period Weeks   Status On-going           PT Long Term Goals - 09/25/15 1444    PT LONG TERM GOAL #1   Title be independent in advanced HEP   Time 8   Period Weeks   Status New   PT LONG TERM GOAL #2   Title reduce FOTO to < or = to 39% limitation   Time 8   Period Weeks   Status New   PT LONG TERM GOAL #3   Title report a 60% reduction in the frequency and intensity of lumbar and thoracic pain with ADLs and self-care   Time 8   Period Weeks   Status New   PT LONG TERM GOAL #4   Title sit for 1 hour at work with neutral posture without limitation   Time 8   Period Weeks   Status New   PT LONG TERM GOAL #5   Title stand and walk for 1 hour without limitation to  allow for return to walking and hiking   Time 8   Period Weeks   Status New               Plan - 10/06/15 1156    Clinical Impression Statement Pt has been making posutral corrections frequently at work.  Pt demonstrates improved seated posture overall in the clinic.  Pt is independent in HEP.  Pain is 5/10 in Rt rhomboids and UT today.  Pt with continued trigger point in Rt rhomboids today and increaesed mobility and tissue length post session today.  Pt will continue to benefit from skilled PT to address muscle spasm and tension in the Rt thoracic and neck region.     Rehab Potential Good   PT Frequency 2x / week   PT Duration 8 weeks   PT Treatment/Interventions ADLs/Self Care Home Management;Cryotherapy;Electrical Stimulation;Moist Heat;Therapeutic exercise;Therapeutic activities;Functional mobility training;Ultrasound;Neuromuscular re-education;Patient/family education;Manual techniques;Taping;Dry needling;Passive range of motion   PT Next Visit Plan Thoracic mobs vs dry needling next session.  [place], manual to Rt thoracic spine, postural strength, flexiblity.  Postural strength with band in supine-issue for HEP.   Consulted and Agree with Plan of Care Patient      Patient will benefit from skilled therapeutic intervention in order to improve the following deficits and impairments:  Pain, Postural dysfunction, Decreased strength, Impaired flexibility, Improper body mechanics, Decreased activity tolerance, Increased muscle spasms  Visit Diagnosis: Pain in thoracic spine  Cramp and spasm  Posture abnormality     Problem List Patient Active Problem List   Diagnosis Date Noted  . IBS (irritable bowel syndrome) 02/19/2015  . Postmenopausal 12/19/2013  . Migraine headache 12/05/2012  . Pain, upper back 09/24/2010  . Depression 09/04/2010    [name], PT 10/06/2015 2:06 PM  [facility] [address] [place], [place], [address] Phone: [phone]   Fax:  [phone]  Name: LEIGHANNA REYNOLDS MRN: [id number] Date of Birth: 12/13/1960

## 2015-10-07 ENCOUNTER — Encounter: Payer: BLUE CROSS/BLUE SHIELD | Admitting: Physical Therapy

## 2015-10-13 ENCOUNTER — Ambulatory Visit: Payer: BLUE CROSS/BLUE SHIELD | Admitting: Physical Therapy

## 2015-10-13 DIAGNOSIS — M546 Pain in thoracic spine: Secondary | ICD-10-CM | POA: Diagnosis not present

## 2015-10-13 DIAGNOSIS — R293 Abnormal posture: Secondary | ICD-10-CM | POA: Diagnosis not present

## 2015-10-13 DIAGNOSIS — R252 Cramp and spasm: Secondary | ICD-10-CM | POA: Diagnosis not present

## 2015-10-13 NOTE — Therapy (Signed)
[facility] [address] [place], [place], [address] Phone: [phone]   Fax:  [phone]  Physical Therapy Treatment  Patient Details  Name: Gabriela Shepard MRN: [id number] Date of Birth: 01-14-1961 Referring Provider: Carolann Littler, MD  Encounter Date: 10/13/2015      PT End of Session - 10/13/15 1156    Visit Number 5   Date for PT Re-Evaluation 11/20/15   PT Start Time [id number]   PT Stop Time 1244   PT Time Calculation (min) 48 min   Activity Tolerance Patient tolerated treatment well   Behavior During Therapy [facility] for tasks assessed/performed      Past Medical History  Diagnosis Date  . Depression   . Urine incontinence   . History of UTI   . History of alcohol abuse     Past Surgical History  Procedure Laterality Date  . Incontinence surgery      There were no vitals filed for this visit.      Subjective Assessment - 10/13/15 1156    Subjective Patient reports she had a longer time of relief this time, about 4 days and now it's not as intense.   Pertinent History Fall 09/05/15-tripped wearing new shoes making transition from carpet to hard floor   How long can you sit comfortably? 30 minutes   How long can you stand comfortably? 20 min   Diagnostic tests x-ray: negative for injury.  DDD in spine per pt report   Patient Stated Goals reduce back pain, get rid of spasm in Rt shoulder blade, improve strength   Currently in Pain? Yes   Pain Score 6    Pain Location Back   Pain Orientation Right;Mid;Lower   Pain Descriptors / Indicators Aching;Dull   Pain Type Acute pain   Pain Radiating Towards right UE   Pain Onset More than a month ago                         [facility] Adult PT Treatment/Exercise - 10/13/15 0001    Modalities   Modalities Ultrasound;Moist Heat   Moist Heat Therapy   Number Minutes Moist Heat 10 Minutes   Moist Heat Location Lumbar Spine  thoracic   Ultrasound   Ultrasound Location  to R rhomboids   Ultrasound Parameters 1.5 w/cm2 1 mhz cont x 8 min   Ultrasound Goals Pain  spasm   Manual Therapy   Manual Therapy Soft tissue mobilization;Myofascial release   Joint Mobilization T3-T8 PA and rotational   Soft tissue mobilization r rhomboids, thoracic paraspinals, R UT          Trigger Point Dry Needling - 10/13/15 1324    Consent Given? Yes   Muscles Treated Upper Body Rhomboids;Upper trapezius  cervical multifidus C3/4/5   Upper Trapezius Response Twitch reponse elicited;Palpable increased muscle length   Rhomboids Response Twitch response elicited;Palpable increased muscle length   Subscapularis Response Twitch response elicited                PT Short Term Goals - 10/06/15 1155    PT SHORT TERM GOAL #1   Title be independent in initial HEP   Status Achieved   PT SHORT TERM GOAL #2   Title verbalize and demonstrate neutral seated posture and report postural corrections with work at computer   Status Achieved   PT [place] #3   Title report < or = to 4/10 thoracic and lumbar pain  with sitting at work   Time 4   Period Weeks   Status On-going           PT Long Term Goals - 09/25/15 1444    PT LONG TERM GOAL #1   Title be independent in advanced HEP   Time 8   Period Weeks   Status New   PT LONG TERM GOAL #2   Title reduce FOTO to < or = to 39% limitation   Time 8   Period Weeks   Status New   PT LONG TERM GOAL #3   Title report a 60% reduction in the frequency and intensity of lumbar and thoracic pain with ADLs and self-care   Time 8   Period Weeks   Status New   PT LONG TERM GOAL #4   Title sit for 1 hour at work with neutral posture without limitation   Time 8   Period Weeks   Status New   PT LONG TERM GOAL #5   Title stand and walk for 1 hour without limitation to allow for return to walking and hiking   Time 8   Period Weeks   Status New               Plan - 10/13/15  1408    Clinical Impression Statement Patient states she had a longer period of relief since past treatment, however she continues to have palpable TPs in rhomboids, subscapularis and UT. Increased tissue length was palpated after needling, but adjustment of arm position caused spasming againg. Thoracic mobs seemed to aggravate rhomboid today as well although mobility was WNL.  Attempted [place] to address rhomboid spasm and tension.   Rehab Potential Good   PT Next Visit Plan Assess [place] response. Continue needling/mobs prn.       Patient will benefit from skilled therapeutic intervention in order to improve the following deficits and impairments:  Pain, Postural dysfunction, Decreased strength, Impaired flexibility, Improper body mechanics, Decreased activity tolerance, Increased muscle spasms  Visit Diagnosis: Pain in thoracic spine  Cramp and spasm     Problem List Patient Active Problem List   Diagnosis Date Noted  . IBS (irritable bowel syndrome) 02/19/2015  . Postmenopausal 12/19/2013  . Migraine headache 12/05/2012  . Pain, upper back 09/24/2010  . Depression 09/04/2010    Madelyn Flavors PT  10/13/2015, 2:17 PM  [facility] [address] [place], [place], [address] Phone: [phone]   Fax:  [phone]  Name: Gabriela Shepard MRN: [id number] Date of Birth: 10-22-1960

## 2015-10-16 ENCOUNTER — Encounter: Payer: BLUE CROSS/BLUE SHIELD | Admitting: Physical Therapy

## 2015-10-21 ENCOUNTER — Other Ambulatory Visit: Payer: Self-pay | Admitting: Family Medicine

## 2015-10-21 ENCOUNTER — Ambulatory Visit: Payer: BLUE CROSS/BLUE SHIELD

## 2015-10-21 DIAGNOSIS — R252 Cramp and spasm: Secondary | ICD-10-CM

## 2015-10-21 DIAGNOSIS — R293 Abnormal posture: Secondary | ICD-10-CM | POA: Diagnosis not present

## 2015-10-21 DIAGNOSIS — M546 Pain in thoracic spine: Secondary | ICD-10-CM

## 2015-10-21 NOTE — Therapy (Signed)
[facility] [address], [place], [place], [address] Phone: [phone]   Fax:  [phone]  Physical Therapy Treatment  Patient Details  Name: Gabriela Shepard MRN: [id number] Date of Birth: [birth year] Referring Provider: Carolann Littler, MD  Encounter Date: 10/21/2015      PT End of Session - 10/21/15 1143    Visit Number 5   Date for PT Re-Evaluation 11/20/15   PT Start Time 1101   PT Stop Time 1153   PT Time Calculation (min) 52 min   Activity Tolerance Patient tolerated treatment well   Behavior During Therapy [facility] for tasks assessed/performed      Past Medical History  Diagnosis Date  . Depression   . Urine incontinence   . History of UTI   . History of alcohol abuse     Past Surgical History  Procedure Laterality Date  . Incontinence surgery      There were no vitals filed for this visit.      Subjective Assessment - 10/21/15 1107    Subjective Pt reports that mid back "feels stuck"   Patient Stated Goals reduce back pain, get rid of spasm in Rt shoulder blade, improve strength   Currently in Pain? Yes   Pain Score 5    Pain Location Thoracic   Pain Orientation Right;Mid;Lower   Pain Descriptors / Indicators Aching;Dull   Pain Type Acute pain   Pain Onset More than a month ago   Pain Frequency Intermittent   Aggravating Factors  sitting still, work at computer   Pain Relieving Factors medication, stretching                         [facility] Adult PT Treatment/Exercise - 10/21/15 0001    Neck Exercises: Machines for Strengthening   UBE (Upper Arm Bike) Level 1 x 6 minutes (3/3)   Neck Exercises: Supine   Other Supine Exercise rolling on foam roll for mobilization   Moist Heat Therapy   Number Minutes Moist Heat 10 Minutes   Moist Heat Location Lumbar Spine  thoracic   Manual Therapy   Manual Therapy Soft tissue mobilization;Myofascial release   Joint Mobilization  T3-T8 PA and rotational   Soft tissue mobilization r rhomboids, thoracic paraspinals, R UT          Trigger Point Dry Needling - 10/21/15 1114    Consent Given? Yes   Muscles Treated Upper Body Upper trapezius;Subscapularis;Rhomboids   Upper Trapezius Response Twitch reponse elicited;Palpable increased muscle length   Rhomboids Response Twitch response elicited;Palpable increased muscle length   Subscapularis Response Twitch response elicited;Palpable increased muscle length                PT Short Term Goals - 10/21/15 1106    PT SHORT TERM GOAL #2   Title verbalize and demonstrate neutral seated posture and report postural corrections with work at computer   Status Achieved   PT [place] #3   Title report < or = to 4/10 thoracic and lumbar pain with sitting at work   Time 4   Period Weeks   Status On-going           PT Long Term Goals - 09/25/15 1444    PT LONG TERM GOAL #1   Title be independent in advanced HEP   Time 8   Period Weeks   Status New   PT LONG TERM GOAL #2   Title  reduce FOTO to < or = to 39% limitation   Time 8   Period Weeks   Status New   PT LONG TERM GOAL #3   Title report a 60% reduction in the frequency and intensity of lumbar and thoracic pain with ADLs and self-care   Time 8   Period Weeks   Status New   PT LONG TERM GOAL #4   Title sit for 1 hour at work with neutral posture without limitation   Time 8   Period Weeks   Status New   PT LONG TERM GOAL #5   Title stand and walk for 1 hour without limitation to allow for return to walking and hiking   Time 8   Period Weeks   Status New               Plan - 10/21/15 1108    Clinical Impression Statement Pt with continued spasm in Rt thoracic spine and this is smaller now. Pain is not as local and intense as it was at the start of care.  Pt rates the pain as 5/10 in the thoracic spine with sitting at work.  Pt reports 25-30% overall improvement in symptoms since  the start of care.  Pt with improved tissue length after session today.  Pt with reduced segmental mobility in the thoracic spine.  Pt will benefit from skilled PT for manual/dry needling, thoracic strength and mobiltiy.     Rehab Potential Good   PT Frequency 2x / week   PT Duration 8 weeks   PT Treatment/Interventions ADLs/Self Care Home Management;Cryotherapy;Electrical Stimulation;Moist Heat;Therapeutic exercise;Therapeutic activities;Functional mobility training;Ultrasound;Neuromuscular re-education;Patient/family education;Manual techniques;Taping;Dry needling;Passive range of motion   PT Next Visit Plan  Continue needling/mobs prn. Scapular strength and mobility   Consulted and Agree with Plan of Care Patient      Patient will benefit from skilled therapeutic intervention in order to improve the following deficits and impairments:  Pain, Postural dysfunction, Decreased strength, Impaired flexibility, Improper body mechanics, Decreased activity tolerance, Increased muscle spasms  Visit Diagnosis: Pain in thoracic spine  Cramp and spasm  Posture abnormality     Problem List Patient Active Problem List   Diagnosis Date Noted  . IBS (irritable bowel syndrome) 02/19/2015  . Postmenopausal 12/19/2013  . Migraine headache 12/05/2012  . Pain, upper back 09/24/2010  . Depression 09/04/2010    [name], PT 10/21/2015 11:44 AM  [facility] [address], [place], [place], [address] Phone: [phone]   Fax:  [phone]  Name: Gabriela Shepard MRN: [id number] Date of Birth: 05/02/1961

## 2015-10-27 ENCOUNTER — Ambulatory Visit: Payer: BLUE CROSS/BLUE SHIELD

## 2015-10-27 DIAGNOSIS — R252 Cramp and spasm: Secondary | ICD-10-CM | POA: Diagnosis not present

## 2015-10-27 DIAGNOSIS — R293 Abnormal posture: Secondary | ICD-10-CM

## 2015-10-27 DIAGNOSIS — M546 Pain in thoracic spine: Secondary | ICD-10-CM

## 2015-10-27 NOTE — Patient Instructions (Signed)
KEEP HEAD IN NEUTRAL AND SHOULDERS DOWN AND RELAXED   Hold tubing in right hand, arm forward. Pull arm back, elbow straight. Repeat __10__ times per set. Do __2__ sets per session. Do _1-2___ sessions per day.  Copyright  VHI. All rights reserved.     With resistive band anchored in door, grasp both ends. Keeping elbows bent, pull back, squeezing shoulder blades together. Hold _3__ seconds. Repeat _2x10___ times. Do _1-2___ sessions per day.  [email or web address].[email or web address]   [facility] [address], [address] [place], [place] [address] Phone # [phone] Fax [phone] 

## 2015-10-27 NOTE — Therapy (Signed)
[facility] [address] [place], [place], [address] Phone: [phone]   Fax:  [phone]  Physical Therapy Treatment  Patient Details  Name: Gabriela Shepard MRN: [id number] Date of Birth: 09-19-1960 Referring Provider: Carolann Littler, MD  Encounter Date: 10/27/2015      PT End of Session - 10/27/15 1530    Visit Number 6   Date for PT Re-Evaluation 11/20/15   PT Start Time [id number]   PT Stop Time 1539   PT Time Calculation (min) 48 min   Activity Tolerance Patient tolerated treatment well   Behavior During Therapy [facility] for tasks assessed/performed      Past Medical History  Diagnosis Date  . Depression   . Urine incontinence   . History of UTI   . History of alcohol abuse     Past Surgical History  Procedure Laterality Date  . Incontinence surgery      There were no vitals filed for this visit.      Subjective Assessment - 10/27/15 1452    Subjective Pt reports that she was sore for 2-3 days after last session, then a lot better.     Patient Stated Goals reduce back pain, get rid of spasm in Rt shoulder blade, improve strength   Currently in Pain? Yes   Pain Score 4    Pain Location Thoracic   Pain Orientation Right;Mid   Pain Descriptors / Indicators Aching;Dull   Pain Type Acute pain   Pain Onset More than a month ago   Pain Frequency Intermittent   Aggravating Factors  sitting still, work at computer   Pain Relieving Factors medication, stretching                         [facility] Adult PT Treatment/Exercise - 10/27/15 0001    Neck Exercises: Machines for Strengthening   UBE (Upper Arm Bike) Level 1 x 6 minutes (3/3)   Neck Exercises: Standing   Other Standing Exercises scapular theraband: bil shoulder extension and rowing with red band 2x10 each   Moist Heat Therapy   Number Minutes Moist Heat 10 Minutes   Moist Heat Location Lumbar Spine  thoracic   Manual Therapy   Manual Therapy Soft tissue mobilization;Myofascial release   Joint Mobilization T3-T8 PA and rotational   Soft tissue mobilization Rt rhomboids, thoracic paraspinals, R UT          Trigger Point Dry Needling - 10/27/15 1502    Consent Given? Yes   Muscles Treated Upper Body Upper trapezius;Rhomboids;Subscapularis   Upper Trapezius Response Twitch reponse elicited;Palpable increased muscle length   Rhomboids Response Twitch response elicited;Palpable increased muscle length   Subscapularis Response Twitch response elicited;Palpable increased muscle length              PT Education - 10/27/15 1501    Education provided Yes   Education Details HEP: scapular theraband attached   Person(s) Educated Patient   Methods Explanation;Demonstration;Handout   Comprehension Verbalized understanding;Returned demonstration          PT Short Term Goals - 10/27/15 1454    PT SHORT TERM GOAL #3   Title report < or = to 4/10 thoracic and lumbar pain with sitting at work   Status Achieved           PT Long Term Goals - 10/27/15 1454    PT LONG TERM GOAL #1   Title be independent in advanced HEP  Time 8   Period Weeks   Status On-going   PT LONG TERM GOAL #3   Title report a 60% reduction in the frequency and intensity of lumbar and thoracic pain with ADLs and self-care   Time 8   Period Weeks   Status On-going  30-40% better   PT LONG TERM GOAL #4   Title sit for 1 hour at work with neutral posture without limitation   Time 8   Period Weeks   Status On-going  10 minutes   PT LONG TERM GOAL #5   Title stand and walk for 1 hour without limitation to allow for return to walking and hiking   Time 8   Period Weeks               Plan - 10/27/15 1455    Clinical Impression Statement Pt with continues spasm in the Rt thoracic spine that has reduced in size.  Pt reports that pain is not as sharp/intense and reports 30-40% overall improvement in symptoms.  Pt is limited  to sitting for 10 minutes and walking 20 minutes.  Pt with improved tissue length and mobility s/p treatment.  Pt will continue to benefit from skilled PT for postural strength, endurance, needling and manual.     Rehab Potential Good   PT Frequency 2x / week   PT Duration 8 weeks   PT Treatment/Interventions ADLs/Self Care Home Management;Cryotherapy;Electrical Stimulation;Moist Heat;Therapeutic exercise;Therapeutic activities;Functional mobility training;Ultrasound;Neuromuscular re-education;Patient/family education;Manual techniques;Taping;Dry needling;Passive range of motion   PT Next Visit Plan  Continue needling/mobs prn. Scapular strength and mobility.  Review new HEP   Consulted and Agree with Plan of Care Patient      Patient will benefit from skilled therapeutic intervention in order to improve the following deficits and impairments:  Pain, Postural dysfunction, Decreased strength, Impaired flexibility, Improper body mechanics, Decreased activity tolerance, Increased muscle spasms  Visit Diagnosis: Pain in thoracic spine  Cramp and spasm  Posture abnormality     Problem List Patient Active Problem List   Diagnosis Date Noted  . IBS (irritable bowel syndrome) 02/19/2015  . Postmenopausal 12/19/2013  . Migraine headache 12/05/2012  . Pain, upper back 09/24/2010  . Depression 09/04/2010     [name], PT 10/27/2015 3:34 PM  [facility] [address], [place], [place], [address] Phone: [phone]   Fax:  [phone]  Name: Gabriela Shepard MRN: [id number] Date of Birth: 26-May-1961

## 2015-11-05 ENCOUNTER — Ambulatory Visit: Payer: BLUE CROSS/BLUE SHIELD

## 2015-11-05 DIAGNOSIS — R252 Cramp and spasm: Secondary | ICD-10-CM

## 2015-11-05 DIAGNOSIS — R293 Abnormal posture: Secondary | ICD-10-CM

## 2015-11-05 DIAGNOSIS — M546 Pain in thoracic spine: Secondary | ICD-10-CM

## 2015-11-05 NOTE — Therapy (Signed)
[facility] [address] [place], [place], [address] Phone: [phone]   Fax:  [phone]  Physical Therapy Treatment  Patient Details  Name: Gabriela Shepard MRN: [id number] Date of Birth: 1961/03/09 Referring Provider: Carolann Littler, MD  Encounter Date: 11/05/2015      PT End of Session - 11/05/15 1136    Visit Number 7   Date for PT Re-Evaluation 11/20/15   PT Start Time 1102  pt had migraine so limited exercise   PT Stop Time 1143   PT Time Calculation (min) 41 min   Activity Tolerance Patient tolerated treatment well   Behavior During Therapy [facility] for tasks assessed/performed      Past Medical History  Diagnosis Date  . Depression   . Urine incontinence   . History of UTI   . History of alcohol abuse     Past Surgical History  Procedure Laterality Date  . Incontinence surgery      There were no vitals filed for this visit.      Subjective Assessment - 11/05/15 1104    Subjective Pain in back isn't as sharp.  Now it aches.     Currently in Pain? Yes   Pain Score 3    Pain Location Thoracic   Pain Orientation Right;Mid   Pain Descriptors / Indicators Aching   Pain Type Chronic pain   Pain Onset More than a month ago   Pain Frequency Intermittent   Aggravating Factors  not able to determine pattern.  Sometimes staying in one place too long   Pain Relieving Factors medication, stretching                         [facility] Adult PT Treatment/Exercise - 11/05/15 0001    Moist Heat Therapy   Number Minutes Moist Heat 10 Minutes   Moist Heat Location Lumbar Spine  thoracic   Manual Therapy   Manual Therapy Soft tissue mobilization;Myofascial release   Joint Mobilization T3-T8 PA and rotational   Soft tissue mobilization Rt rhomboids, thoracic paraspinals, R UT          Trigger Point Dry Needling - 11/05/15 1107    Consent Given? Yes   Muscles Treated Upper Body Upper  trapezius;Rhomboids;Subscapularis   Upper Trapezius Response Twitch reponse elicited;Palpable increased muscle length   Rhomboids Response Twitch response elicited;Palpable increased muscle length   Subscapularis Response Twitch response elicited;Palpable increased muscle length                PT Short Term Goals - 10/27/15 1454    PT SHORT TERM GOAL #3   Title report < or = to 4/10 thoracic and lumbar pain with sitting at work   Status Achieved           PT Long Term Goals - 11/05/15 1106    PT LONG TERM GOAL #1   Title be independent in advanced HEP   Time 8   Period Weeks   Status On-going   PT LONG TERM GOAL #2   Title reduce FOTO to < or = to 39% limitation   Time 8   Period Weeks   Status On-going   PT LONG TERM GOAL #3   Title report a 60% reduction in the frequency and intensity of lumbar and thoracic pain with ADLs and self-care   Time 8   Period Weeks   Status On-going  40-50% better   PT LONG TERM  GOAL #4   Title sit for 1 hour at work with neutral posture without limitation   Time 8   Period Weeks   Status On-going  20 minutes   PT LONG TERM GOAL #5   Title stand and walk for 1 hour without limitation to allow for return to walking and hiking   Time 8   Period Weeks   Status On-going  30-40 minutes               Plan - 11/05/15 1135    Clinical Impression Statement Pt reports 40-50% overall improvement since the start of care. Pt is able to sit for 20 minutes and stand 30-40 minutes.  Pt with continued active trigger points over Rt rhomboids and UT.  Pt with migraine today so no exercise.  Pt will continue to benefit from skilled PT for manual, strength and flexiblity.     Rehab Potential Good   PT Frequency 2x / week   PT Duration 8 weeks   PT Treatment/Interventions ADLs/Self Care Home Management;Cryotherapy;Electrical Stimulation;Moist Heat;Therapeutic exercise;Therapeutic activities;Functional mobility  training;Ultrasound;Neuromuscular re-education;Patient/family education;Manual techniques;Taping;Dry needling;Passive range of motion   PT Next Visit Plan  Continue needling/mobs prn. Scapular strength and mobility.  Review new HEP   Consulted and Agree with Plan of Care Patient      Patient will benefit from skilled therapeutic intervention in order to improve the following deficits and impairments:  Pain, Postural dysfunction, Decreased strength, Impaired flexibility, Improper body mechanics, Decreased activity tolerance, Increased muscle spasms  Visit Diagnosis: Pain in thoracic spine  Cramp and spasm  Posture abnormality     Problem List Patient Active Problem List   Diagnosis Date Noted  . IBS (irritable bowel syndrome) 02/19/2015  . Postmenopausal 12/19/2013  . Migraine headache 12/05/2012  . Pain, upper back 09/24/2010  . Depression 09/04/2010     [name], PT 11/05/2015 11:38 AM  [facility] [address], [place], [place], [address] Phone: [phone]   Fax:  [phone]  Name: Gabriela Shepard MRN: [id number] Date of Birth: 05/04/1961

## 2015-11-10 DIAGNOSIS — M25562 Pain in left knee: Secondary | ICD-10-CM | POA: Diagnosis not present

## 2015-11-11 ENCOUNTER — Ambulatory Visit: Payer: BLUE CROSS/BLUE SHIELD | Attending: Family Medicine

## 2015-11-11 DIAGNOSIS — M545 Low back pain: Secondary | ICD-10-CM | POA: Insufficient documentation

## 2015-11-11 DIAGNOSIS — R293 Abnormal posture: Secondary | ICD-10-CM | POA: Diagnosis not present

## 2015-11-11 DIAGNOSIS — M546 Pain in thoracic spine: Secondary | ICD-10-CM | POA: Diagnosis not present

## 2015-11-11 DIAGNOSIS — R252 Cramp and spasm: Secondary | ICD-10-CM | POA: Insufficient documentation

## 2015-11-11 NOTE — Therapy (Signed)
[facility] [address] [place], [place], [address] Phone: [phone]   Fax:  [phone]  Physical Therapy Treatment  Patient Details  Name: Gabriela Shepard MRN: [id number] Date of Birth: August 15, 1960 Referring Provider: Carolann Littler, MD  Encounter Date: 11/11/2015      PT End of Session - 11/11/15 1059    Visit Number 8   Date for PT Re-Evaluation 11/20/15   PT Start Time 1022  late and had migraine   PT Stop Time 1105   PT Time Calculation (min) 43 min   Activity Tolerance Other (comment)   Behavior During Therapy [facility] for tasks assessed/performed      Past Medical History  Diagnosis Date  . Depression   . Urine incontinence   . History of UTI   . History of alcohol abuse     Past Surgical History  Procedure Laterality Date  . Incontinence surgery      There were no vitals filed for this visit.      Subjective Assessment - 11/11/15 1026    Subjective Pt has a migrane again today.  Agreeed to a little bit of exercise.  Pt is going to have knee replacement 12/23/15.     Patient Stated Goals reduce back pain, get rid of spasm in Rt shoulder blade, improve strength   Currently in Pain? Yes   Pain Location Thoracic   Pain Orientation Right;Mid   Pain Descriptors / Indicators Aching   Pain Type Chronic pain   Pain Onset More than a month ago   Pain Frequency Intermittent   Aggravating Factors  Not able to determine a pattern.  Staying in one place too long   Pain Relieving Factors medication, stretching                         [facility] Adult PT Treatment/Exercise - 11/11/15 0001    Neck Exercises: Machines for Strengthening   UBE (Upper Arm Bike) Level 1 x 6 minutes (3/3)   Other Machines for Strengthening seated thoracolumbar rotation  3x20 seconds   Moist Heat Therapy   Number Minutes Moist Heat 10 Minutes   Moist Heat Location Lumbar Spine  thoracic   Manual Therapy   Manual Therapy Soft tissue mobilization;Myofascial release   Soft tissue mobilization Rt rhomboids, thoracic paraspinals, R UT          Trigger Point Dry Needling - 11/11/15 1034    Consent Given? Yes   Muscles Treated Upper Body Upper trapezius;Rhomboids;Subscapularis   Upper Trapezius Response Twitch reponse elicited;Palpable increased muscle length   Rhomboids Response Twitch response elicited;Palpable increased muscle length   Subscapularis Response Twitch response elicited;Palpable increased muscle length                PT Short Term Goals - 10/27/15 1454    PT SHORT TERM GOAL #3   Title report < or = to 4/10 thoracic and lumbar pain with sitting at work   Status Achieved           PT Long Term Goals - 11/11/15 1028    PT LONG TERM GOAL #1   Title be independent in advanced HEP   Time 8   Period Weeks   Status On-going   PT LONG TERM GOAL #2   Title reduce FOTO to < or = to 39% limitation   Time 8   Period Weeks   Status On-going   PT LONG TERM  GOAL #3   Title report a 60% reduction in the frequency and intensity of lumbar and thoracic pain with ADLs and self-care   Time 8   Period Weeks   Status On-going  50-60%    PT LONG TERM GOAL #4   Title sit for 1 hour at work with neutral posture without limitation   Time 8   Period Weeks   Status On-going   PT LONG TERM GOAL #5   Title stand and walk for 1 hour without limitation to allow for return to walking and hiking   Time 8   Period Weeks   Status On-going               Plan - 11/11/15 1029    Clinical Impression Statement Pt reports 50-60% overall improvement in symptoms in the Rt thoracic spince since the start of care.  Pt is still limited to standing and walking long periods.  Pt with continued active trigger points over Rt rhomboids and UT.  Pt with miigraine today so no exericise.  Pt will continue to benefit from skilled PT for dry needling, manual, strength and flexiblity.     Rehab  Potential Good   PT Frequency 2x / week   PT Duration 8 weeks   PT Treatment/Interventions ADLs/Self Care Home Management;Cryotherapy;Electrical Stimulation;Moist Heat;Therapeutic exercise;Therapeutic activities;Functional mobility training;Ultrasound;Neuromuscular re-education;Patient/family education;Manual techniques;Taping;Dry needling;Passive range of motion   PT Next Visit Plan  Continue needling/mobs prn. Scapular strength and mobility.  Postural strength   Consulted and Agree with Plan of Care Patient      Patient will benefit from skilled therapeutic intervention in order to improve the following deficits and impairments:  Pain, Postural dysfunction, Decreased strength, Impaired flexibility, Improper body mechanics, Decreased activity tolerance, Increased muscle spasms  Visit Diagnosis: Pain in thoracic spine  Cramp and spasm  Posture abnormality     Problem List Patient Active Problem List   Diagnosis Date Noted  . IBS (irritable bowel syndrome) 02/19/2015  . Postmenopausal 12/19/2013  . Migraine headache 12/05/2012  . Pain, upper back 09/24/2010  . Depression 09/04/2010     [name], PT 11/11/2015 11:01 AM  [facility] [address], [place], [place], [address] Phone: [phone]   Fax:  [phone]  Name: Gabriela Shepard MRN: [id number] Date of Birth: [birth year]

## 2015-11-19 ENCOUNTER — Ambulatory Visit: Payer: BLUE CROSS/BLUE SHIELD

## 2015-11-19 DIAGNOSIS — M545 Low back pain, unspecified: Secondary | ICD-10-CM

## 2015-11-19 DIAGNOSIS — R293 Abnormal posture: Secondary | ICD-10-CM

## 2015-11-19 DIAGNOSIS — R252 Cramp and spasm: Secondary | ICD-10-CM

## 2015-11-19 DIAGNOSIS — M546 Pain in thoracic spine: Secondary | ICD-10-CM

## 2015-11-19 NOTE — Therapy (Addendum)
[facility] [address] [place], [place], [address] Phone: [phone]   Fax:  [phone]  Physical Therapy Treatment  Patient Details  Name: Gabriela Shepard MRN: [id number] Date of Birth: 1961-03-29 Referring Provider: Carolann Littler, MD  Encounter Date: 11/19/2015      PT End of Session - 11/19/15 1146    Visit Number 9   Date for PT Re-Evaluation 12/31/15   PT Start Time 1108  dry needling, late for appt   PT Stop Time 1156   PT Time Calculation (min) 48 min   Activity Tolerance Patient tolerated treatment well   Behavior During Therapy [facility] for tasks assessed/performed      Past Medical History  Diagnosis Date  . Depression   . Urine incontinence   . History of UTI   . History of alcohol abuse     Past Surgical History  Procedure Laterality Date  . Incontinence surgery      There were no vitals filed for this visit.      Subjective Assessment - 11/19/15 1145    Subjective Pt will try on her own for2-3 weeks and come back if she needs.  Pt will have knee replacement in July and wants to save visits.     Currently in Pain? Yes   Pain Score 4    Pain Location Thoracic   Pain Orientation Right;Mid   Pain Descriptors / Indicators Aching   Pain Type Chronic pain   Pain Onset More than a month ago   Pain Frequency Intermittent   Aggravating Factors  No pattern, staying in one place too long   Pain Relieving Factors medication, stretching            [facility] PT Assessment - 11/19/15 0001    Assessment   Medical Diagnosis pain, upper back (M54.6)   Onset Date/Surgical Date 09/05/15   Prior Function   Level of Independence Independent   Observation/Other Assessments   Focus on Therapeutic Outcomes (FOTO)  35% limitation                     [facility] Adult PT Treatment/Exercise - 11/19/15 0001    Neck Exercises: Machines for Strengthening   UBE (Upper Arm Bike) Level 1 x 6  minutes (3/3)   Moist Heat Therapy   Number Minutes Moist Heat 10 Minutes   Moist Heat Location Lumbar Spine  thoracic   Manual Therapy   Manual Therapy Soft tissue mobilization;Myofascial release   Soft tissue mobilization Rt rhomboids, thoracic paraspinals, R UT          Trigger Point Dry Needling - 11/19/15 1125    Consent Given? Yes   Muscles Treated Upper Body Oblique capitus;Upper trapezius;Rhomboids;Subscapularis   Upper Trapezius Response Twitch reponse elicited;Palpable increased muscle length   Oblique Capitus Response Twitch response elicited;Palpable increased muscle length   Rhomboids Response Twitch response elicited;Palpable increased muscle length   Subscapularis Response Twitch response elicited;Palpable increased muscle length                PT Short Term Goals - 10/27/15 1454    PT SHORT TERM GOAL #3   Title report < or = to 4/10 thoracic and lumbar pain with sitting at work   Status Achieved           PT Long Term Goals - 11/19/15 1110    PT LONG TERM GOAL #1   Title be independent in advanced HEP  Time 6   Period Weeks   Status On-going   PT LONG TERM GOAL #2   Title reduce FOTO to < or = to 39% limitation   Status Achieved   PT LONG TERM GOAL #3   Title report a 60% reduction in the frequency and intensity of lumbar and thoracic pain with ADLs and self-care   Status Achieved   PT LONG TERM GOAL #4   Title sit for 1 hour at work with neutral posture without limitation   Time 6   Period Weeks   Status On-going   PT LONG TERM GOAL #5   Title stand and walk for 1 hour without limitation to allow for return to walking and hiking   Time 6   Period Weeks   Status On-going  45 minutes    Additional Long Term Goals   Additional Long Term Goals Yes   PT LONG TERM GOAL #6   Title report a 80-90% reduction in thoracic pain since with work and home tasks   Time 6   Period Weeks   Status New               Plan - 11/19/15 1114     Clinical Impression Statement Pt reports 60-70% overall improvement since the start of care.  FOTO score is 34% limitation.  Pt has been working her posture and making corrections at home.  Pt with active trigger points in Rt neck, rhomboids and UT.  Pt able to sit for 15-20 minutes with neutral seated posture and able to walk 45 minutes (mostly limited by knee pain).  Pt will be placed on hold for a few weeks to see how she does without PT.  PT has knee replacement scheduled for next month and would like to save visits if possible.  Pt might need 1-2 more sessions for manual/needling and progression of HEP.     Rehab Potential Good   PT Frequency 1x / week   PT Duration 6 weeks   PT Treatment/Interventions ADLs/Self Care Home Management;Cryotherapy;Electrical Stimulation;Moist Heat;Therapeutic exercise;Therapeutic activities;Functional mobility training;Ultrasound;Neuromuscular re-education;Patient/family education;Manual techniques;Taping;Dry needling;Passive range of motion   PT Next Visit Plan Pt to be placed on hold with 1-2 more sessions possible   Consulted and Agree with Plan of Care Patient      Patient will benefit from skilled therapeutic intervention in order to improve the following deficits and impairments:  Pain, Postural dysfunction, Decreased strength, Impaired flexibility, Improper body mechanics, Decreased activity tolerance, Increased muscle spasms  Visit Diagnosis: Pain in thoracic spine - Plan: PT plan of care cert/re-cert  Cramp and spasm - Plan: PT plan of care cert/re-cert  Posture abnormality - Plan: PT plan of care cert/re-cert  Bilateral low back pain without sciatica - Plan: PT plan of care cert/re-cert     Problem List Patient Active Problem List   Diagnosis Date Noted  . IBS (irritable bowel syndrome) 02/19/2015  . Postmenopausal 12/19/2013  . Migraine headache 12/05/2012  . Pain, upper back 09/24/2010  . Depression 09/04/2010    [name],  PT 11/19/2015 11:47 AM PHYSICAL THERAPY DISCHARGE SUMMARY  Visits from Start of Care: 9  Current functional level related to goals / functional outcomes: See above.  Pt is going to have knee replacement surgery in July.     Remaining deficits: See above for current status.     Education / Equipment: HEP Plan: Patient agrees to discharge.  Patient goals were partially met. Patient is being discharged due to  being pleased with the current functional level.  ?????   [name], PT 12/10/2015 8:11 AM  [facility] [address] [place], [place], [address] Phone: [phone]   Fax:  [phone]  Name: Gabriela Shepard MRN: [id number] Date of Birth: 09-04-1960

## 2015-11-24 ENCOUNTER — Other Ambulatory Visit: Payer: Self-pay | Admitting: Orthopaedic Surgery

## 2015-11-26 ENCOUNTER — Ambulatory Visit (INDEPENDENT_AMBULATORY_CARE_PROVIDER_SITE_OTHER): Payer: BLUE CROSS/BLUE SHIELD | Admitting: Family Medicine

## 2015-11-26 ENCOUNTER — Other Ambulatory Visit: Payer: Self-pay | Admitting: Family Medicine

## 2015-11-26 VITALS — BP 100/80 | HR 96 | Temp 98.1°F | Ht 64.0 in | Wt 151.0 lb

## 2015-11-26 DIAGNOSIS — G43909 Migraine, unspecified, not intractable, without status migrainosus: Secondary | ICD-10-CM | POA: Diagnosis not present

## 2015-11-26 MED ORDER — RIZATRIPTAN BENZOATE 10 MG PO TABS: 10.0000 mg | ORAL_TABLET | ORAL | Status: DC | PRN

## 2015-11-26 MED ORDER — PREDNISONE 20 MG PO TABS: ORAL_TABLET | ORAL | Status: DC

## 2015-11-26 MED ORDER — PROMETHAZINE HCL 25 MG PO TABS: 25.0000 mg | ORAL_TABLET | Freq: Four times a day (QID) | ORAL | Status: DC | PRN

## 2015-11-26 NOTE — Progress Notes (Signed)
   Subjective:    Patient ID: KENSY HUITT, female    DOB: 08-09-1960, 55 y.o.   MRN: [id number]  HPI Patient seen with frequent migraine headaches recently. She states she's had about 6 total over the past couple weeks and she thinks this may be triggered by changes in barometric pressure. She recently took Imitrex but states that Maxalt has worked better for her in the past. Most recent headache started this morning. She took one Imitrex and had some relief but not total relief. Her current pain is about 6 out of 10. She's had some nausea without vomiting. Symptoms are right retro-orbital. These are typical for migraines in the past. No new symptoms.  She is not aware of any food triggers. Neurologist had suggested Depakote but patient declined starting this.  Past Medical History  Diagnosis Date  . Depression   . Urine incontinence   . History of UTI   . History of alcohol abuse    Past Surgical History  Procedure Laterality Date  . Incontinence surgery      reports that she quit smoking about 10 years ago. Her smoking use included Cigarettes. She has a 5 pack-year smoking history. She has never used smokeless tobacco. She reports that she does not drink alcohol or use illicit drugs. family history includes Alcohol abuse in her other; Arthritis in her other; Hypertension in her mother and other; Mental illness in her other. There is no history of Colon cancer. Allergies  Allergen Reactions  . Sulfa Antibiotics     hives      Review of Systems  Constitutional: Negative for fever and chills.  Respiratory: Negative for shortness of breath.   Cardiovascular: Negative for chest pain.  Gastrointestinal: Positive for nausea. Negative for vomiting and abdominal pain.  Neurological: Positive for headaches. Negative for dizziness.       Objective:   Physical Exam  Constitutional: She is oriented to person, place, and time. She appears well-developed and well-nourished.    Eyes: EOM are normal. Pupils are equal, round, and reactive to light.  Neck: Neck supple.  Cardiovascular: Normal rate and regular rhythm.   Pulmonary/Chest: Effort normal and breath sounds normal. No respiratory distress. She has no wheezes. She has no rales.  Neurological: She is alert and oriented to person, place, and time. No cranial nerve deficit. Coordination normal.  No focal strength deficits          Assessment & Plan:  Migraine headaches. Possibly related to changes in barometric pressure. Increased frequency past couple weeks. Refill Maxalt for as needed use. She is aware of limit of no more than 2 in 24 hours. She requested refill of Phenergan which has helped her migraines in the past.- 25 mg every 6 hours as needed with caution for sedation. We wrote for brief prednisone to take only if migraine not resolving over the next 48 hours. She did have a status migraine recently but took prednisone to resolve.  Prevention discussed. She is opposed to taking "psychotropic medications ". We discussed possible low-dose propranolol but sure he has fairly low blood pressure of 100/80. If headache frequency continues into the next couple weeks will consider low-dose beta blocker  Eulas Post MD [facility] at [facility]

## 2015-11-26 NOTE — Telephone Encounter (Signed)
Due 12/03/15.

## 2015-11-26 NOTE — Progress Notes (Signed)
Pre visit review using our clinic review tool, if applicable. No additional management support is needed unless otherwise documented below in the visit note. 

## 2015-11-28 NOTE — Telephone Encounter (Signed)
Last filled on 06/04/15#90 + 1 , last OV 11/26/15. OK to refill?

## 2015-11-28 NOTE — Telephone Encounter (Signed)
OK to refill

## 2015-12-02 NOTE — Addendum Note (Signed)
Addended by: Elio Forget on: 12/02/2015 02:03 PM   Modules accepted: Orders

## 2015-12-11 ENCOUNTER — Other Ambulatory Visit: Payer: Self-pay

## 2015-12-11 ENCOUNTER — Encounter (HOSPITAL_COMMUNITY)
Admission: RE | Admit: 2015-12-11 | Discharge: 2015-12-11 | Disposition: A | Discharge status: Home / Self Care | Payer: BLUE CROSS/BLUE SHIELD | Source: Ambulatory Visit | Attending: Orthopaedic Surgery | Admitting: Orthopaedic Surgery

## 2015-12-11 ENCOUNTER — Other Ambulatory Visit (HOSPITAL_COMMUNITY): Payer: Self-pay | Admitting: *Deleted

## 2015-12-11 ENCOUNTER — Ambulatory Visit (HOSPITAL_COMMUNITY)
Admission: RE | Admit: 2015-12-11 | Discharge: 2015-12-11 | Disposition: A | Discharge status: Home / Self Care | Payer: BLUE CROSS/BLUE SHIELD | Source: Ambulatory Visit | Attending: Orthopaedic Surgery | Admitting: Orthopaedic Surgery

## 2015-12-11 ENCOUNTER — Encounter (HOSPITAL_COMMUNITY): Payer: Self-pay

## 2015-12-11 DIAGNOSIS — Z01812 Encounter for preprocedural laboratory examination: Secondary | ICD-10-CM | POA: Insufficient documentation

## 2015-12-11 DIAGNOSIS — R911 Solitary pulmonary nodule: Secondary | ICD-10-CM | POA: Insufficient documentation

## 2015-12-11 DIAGNOSIS — Z01810 Encounter for preprocedural cardiovascular examination: Secondary | ICD-10-CM | POA: Diagnosis not present

## 2015-12-11 DIAGNOSIS — Z01818 Encounter for other preprocedural examination: Secondary | ICD-10-CM

## 2015-12-11 HISTORY — DX: Nausea with vomiting, unspecified: R11.2

## 2015-12-11 HISTORY — DX: Unspecified osteoarthritis, unspecified site: M19.90

## 2015-12-11 HISTORY — DX: Other specified postprocedural states: Z98.890

## 2015-12-11 LAB — PROTIME-INR
INR: 1.01 (ref 0.00–1.49)
Prothrombin Time: 13.5 seconds (ref 11.6–15.2)

## 2015-12-11 LAB — URINALYSIS, ROUTINE W REFLEX MICROSCOPIC
BILIRUBIN URINE: NEGATIVE
GLUCOSE, UA: NEGATIVE mg/dL
HGB URINE DIPSTICK: NEGATIVE
KETONES UR: NEGATIVE mg/dL
Leukocytes, UA: NEGATIVE
Nitrite: NEGATIVE
PROTEIN: NEGATIVE mg/dL
Specific Gravity, Urine: 1.015 (ref 1.005–1.030)
pH: 7 (ref 5.0–8.0)

## 2015-12-11 LAB — CBC WITH DIFFERENTIAL/PLATELET
BASOS PCT: 0 %
Basophils Absolute: 0 10*3/uL (ref 0.0–0.1)
Eosinophils Absolute: 0.1 10*3/uL (ref 0.0–0.7)
Eosinophils Relative: 2 %
HEMATOCRIT: 39.4 % (ref 36.0–46.0)
HEMOGLOBIN: 12.9 g/dL (ref 12.0–15.0)
Lymphocytes Relative: 28 %
Lymphs Abs: 1.5 10*3/uL (ref 0.7–4.0)
MCH: 29.3 pg (ref 26.0–34.0)
MCHC: 32.7 g/dL (ref 30.0–36.0)
MCV: 89.5 fL (ref 78.0–100.0)
MONOS PCT: 9 %
Monocytes Absolute: 0.5 10*3/uL (ref 0.1–1.0)
NEUTROS ABS: 3.3 10*3/uL (ref 1.7–7.7)
NEUTROS PCT: 61 %
Platelets: 295 10*3/uL (ref 150–400)
RBC: 4.4 MIL/uL (ref 3.87–5.11)
RDW: 12.4 % (ref 11.5–15.5)
WBC: 5.4 10*3/uL (ref 4.0–10.5)

## 2015-12-11 LAB — TYPE AND SCREEN
ABO/RH(D): A POS
Antibody Screen: NEGATIVE

## 2015-12-11 LAB — COMPREHENSIVE METABOLIC PANEL
ALK PHOS: 52 U/L (ref 38–126)
ALT: 29 U/L (ref 14–54)
ANION GAP: 9 (ref 5–15)
AST: 24 U/L (ref 15–41)
Albumin: 3.7 g/dL (ref 3.5–5.0)
BILIRUBIN TOTAL: 0.2 mg/dL — AB (ref 0.3–1.2)
BUN: 12 mg/dL (ref 6–20)
CALCIUM: 9.1 mg/dL (ref 8.9–10.3)
CO2: 23 mmol/L (ref 22–32)
Chloride: 100 mmol/L — ABNORMAL LOW (ref 101–111)
Creatinine, Ser: 0.76 mg/dL (ref 0.44–1.00)
GLUCOSE: 100 mg/dL — AB (ref 65–99)
POTASSIUM: 4 mmol/L (ref 3.5–5.1)
Sodium: 132 mmol/L — ABNORMAL LOW (ref 135–145)
TOTAL PROTEIN: 7 g/dL (ref 6.5–8.1)

## 2015-12-11 LAB — ABO/RH: ABO/RH(D): A POS

## 2015-12-11 LAB — SURGICAL PCR SCREEN
MRSA, PCR: NEGATIVE
Staphylococcus aureus: NEGATIVE

## 2015-12-11 LAB — APTT: aPTT: 27 seconds (ref 24–37)

## 2015-12-11 NOTE — Pre-Procedure Instructions (Signed)
    Gabriela Shepard  12/11/2015      [id number] - [place], [place] - [address] [place] [place] [address] Phone: [phone] Fax: [place], [place] - [address] [place] [address] Phone: [phone] Fax: [phone]  [id number] - [place], [place] [place] [address] Phone: [phone] Fax: [phone]    Your procedure is scheduled on Tuesday July 18th at 1:20pm  Report to [facility] Admitting at 11:20pm  Call this number if you have problems the morning of surgery:  [phone]   Remember:  Do not eat food or drink liquids after midnight on Monday the 17th  Take these medicines the morning of surgery with A SIP OF WATER: Wellbutrin, clonazepam (klonopin), flexeril if needed, prozac, methocarbamol if needed, promethazine (phenergan) if needed   Do not wear jewelry, make-up or nail polish.  Do not wear lotions, powders, or perfumes.  You may not wear deoderant.  Do not shave 48 hours prior to surgery.    Do not bring valuables to the hospital.  [facility] is not responsible for any belongings or valuables.  Contacts, dentures or bridgework may not be worn into surgery.  Leave your suitcase in the car.  After surgery it may be brought to your room.   Special instructions:  Shower with CHG as instructed the night before surgery and morning of surgery  Please read over the following fact sheets that you were given. Total joint packet, MRSA information, CHG shower

## 2015-12-16 ENCOUNTER — Telehealth: Payer: Self-pay | Admitting: Family Medicine

## 2015-12-16 MED ORDER — LEVOCETIRIZINE DIHYDROCHLORIDE 5 MG PO TABS: 5.0000 mg | ORAL_TABLET | Freq: Every evening | ORAL | Status: DC

## 2015-12-16 NOTE — Telephone Encounter (Signed)
Pt need new Rx for levocetirizine    Pharm:  [facility]

## 2015-12-16 NOTE — Telephone Encounter (Signed)
Medication sent in for patient. 

## 2015-12-17 NOTE — H&P (Signed)
TOTAL KNEE ADMISSION H&P  Patient is being admitted for left total knee arthroplasty.  Subjective:  Chief Complaint:left knee pain.  HPI: Gabriela Shepard, 55 y.o. female, has a history of pain and functional disability in the left knee due to arthritis and has failed non-surgical conservative treatments for greater than 12 weeks to includeNSAID's and/or analgesics, corticosteriod injections, viscosupplementation injections, flexibility and strengthening excercises, weight reduction as appropriate and activity modification.  Onset of symptoms was gradual, starting 5 years ago with gradually worsening course since that time. The patient noted no past surgery on the left knee(s).  Patient currently rates pain in the left knee(s) at 10 out of 10 with activity. Patient has night pain, worsening of pain with activity and weight bearing, pain that interferes with activities of daily living, crepitus and joint swelling.  Patient has evidence of subchondral cysts, subchondral sclerosis, periarticular osteophytes and joint space narrowing by imaging studies. There is no active infection.  Patient Active Problem List   Diagnosis Date Noted  . IBS (irritable bowel syndrome) 02/19/2015  . Postmenopausal 12/19/2013  . Migraine headache 12/05/2012  . Pain, upper back 09/24/2010  . Depression 09/04/2010   Past Medical History  Diagnosis Date  . Depression   . Urine incontinence   . History of UTI   . 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 Arthritis   . PONV (postoperative nausea and vomiting)     Past Surgical History  Procedure Laterality Date  . Incontinence surgery    . Cesarean section  1992  . Laparoscopy      under bladder    No prescriptions prior to admission   Allergies  Allergen Reactions  . Sulfa Antibiotics     hives    Social History  Substance Use Topics  . Smoking status: Former Smoker -- 0.50 packs/day for 10 years    Types: Cigarettes    Quit  date: 05/11/2005  . Smokeless tobacco: Never Used  . Alcohol Use: No    Family History  Problem Relation Age of Onset  . Alcohol abuse Other   . Arthritis Other   . Hypertension Other   . Mental illness Other   . Colon cancer Neg Hx   . Hypertension Mother      Review of Systems  Musculoskeletal: Positive for joint pain.       Left knee  All other systems reviewed and are negative.   Objective:  Physical Exam  Constitutional: She is oriented to person, place, and time. She appears well-developed and well-nourished.  HENT:  Head: Normocephalic and atraumatic.  Eyes: Conjunctivae are normal. Pupils are equal, round, and reactive to light.  Neck: Normal range of motion.  Cardiovascular: Normal rate and regular rhythm.   Respiratory: Effort normal.  GI: Soft.  Musculoskeletal:  Left knee has trace effusion with motion from 0-120. She has pain on the medial joint line and some patellofemoral pain. She has some crepitation towards full extension. Hip motion is good and straight leg raise is negative. Sensation and motor function are intact in her feet with palpable pulses on both sides. There is no palpable lymphadenopathy behind her knee.   Neurological: She is alert and oriented to person, place, and time.  Skin: Skin is warm and dry.  Psychiatric: She has a normal mood and affect. Her behavior is normal. Judgment and thought content normal.    Vital signs in last 24 hours:  Labs:   Estimated body mass index is 25.56 kg/(m^2) as calculated from the following:   Height as of 09/15/15: 5\' 4"  (1.626 m).   Weight as of 09/15/15: 67.586 kg (149 lb).   Imaging Review Plain radiographs demonstrate severe degenerative joint disease of the left knee(s). The overall alignment isneutral. The bone quality appears to be good for age and reported activity level.  Assessment/Plan:  End stage primary arthritis, left knee   The patient history, physical examination, clinical  judgment of the provider and imaging studies are consistent with end stage degenerative joint disease of the left knee(s) and total knee arthroplasty is deemed medically necessary. The treatment options including medical management, injection therapy arthroscopy and arthroplasty were discussed at length. The risks and benefits of total knee arthroplasty were presented and reviewed. The risks due to aseptic loosening, infection, stiffness, patella tracking problems, thromboembolic complications and other imponderables were discussed. The patient acknowledged the explanation, agreed to proceed with the plan and consent was signed. Patient is being admitted for inpatient treatment for surgery, pain control, PT, OT, prophylactic antibiotics, VTE prophylaxis, progressive ambulation and ADL's and discharge planning. The patient is planning to be discharged home with home health services

## 2015-12-23 ENCOUNTER — Inpatient Hospital Stay (HOSPITAL_COMMUNITY): Payer: BLUE CROSS/BLUE SHIELD | Admitting: Anesthesiology

## 2015-12-23 ENCOUNTER — Encounter (HOSPITAL_COMMUNITY)
Admission: RE | Disposition: A | Discharge status: Home Health | Payer: Self-pay | Source: Ambulatory Visit | Attending: Orthopaedic Surgery

## 2015-12-23 ENCOUNTER — Inpatient Hospital Stay (HOSPITAL_COMMUNITY): Payer: BLUE CROSS/BLUE SHIELD

## 2015-12-23 ENCOUNTER — Inpatient Hospital Stay (HOSPITAL_COMMUNITY)
Admission: RE | Admit: 2015-12-23 | Discharge: 2015-12-26 | DRG: 470 | Disposition: A | Discharge status: Home Health | Payer: BLUE CROSS/BLUE SHIELD | Source: Ambulatory Visit | Attending: Orthopaedic Surgery | Admitting: Orthopaedic Surgery

## 2015-12-23 ENCOUNTER — Encounter (HOSPITAL_COMMUNITY): Payer: Self-pay | Admitting: Anesthesiology

## 2015-12-23 DIAGNOSIS — R911 Solitary pulmonary nodule: Secondary | ICD-10-CM | POA: Diagnosis not present

## 2015-12-23 DIAGNOSIS — Z87891 Personal history of nicotine dependence: Secondary | ICD-10-CM

## 2015-12-23 DIAGNOSIS — R339 Retention of urine, unspecified: Secondary | ICD-10-CM | POA: Diagnosis not present

## 2015-12-23 DIAGNOSIS — Z96652 Presence of left artificial knee joint: Secondary | ICD-10-CM | POA: Diagnosis not present

## 2015-12-23 DIAGNOSIS — R9389 Abnormal findings on diagnostic imaging of other specified body structures: Secondary | ICD-10-CM

## 2015-12-23 DIAGNOSIS — M25562 Pain in left knee: Secondary | ICD-10-CM | POA: Diagnosis not present

## 2015-12-23 DIAGNOSIS — F329 Major depressive disorder, single episode, unspecified: Secondary | ICD-10-CM | POA: Diagnosis present

## 2015-12-23 DIAGNOSIS — M1712 Unilateral primary osteoarthritis, left knee: Secondary | ICD-10-CM | POA: Diagnosis present

## 2015-12-23 DIAGNOSIS — M179 Osteoarthritis of knee, unspecified: Secondary | ICD-10-CM | POA: Diagnosis not present

## 2015-12-23 HISTORY — DX: Headache: R51

## 2015-12-23 HISTORY — PX: TOTAL KNEE ARTHROPLASTY: SHX125

## 2015-12-23 HISTORY — DX: Headache, unspecified: R51.9

## 2015-12-23 SURGERY — ARTHROPLASTY, KNEE, TOTAL
Anesthesia: Spinal | Laterality: Left

## 2015-12-23 MED ORDER — METOCLOPRAMIDE HCL 5 MG PO TABS: 5.0000 mg | ORAL_TABLET | Freq: Three times a day (TID) | ORAL | Status: DC | PRN

## 2015-12-23 MED ORDER — SODIUM CHLORIDE 0.9 % IR SOLN
Status: DC | PRN
  Administered 2015-12-23: 3000 mL

## 2015-12-23 MED ORDER — LORATADINE 10 MG PO TABS
10.0000 mg | ORAL_TABLET | Freq: Every day | ORAL | Status: DC
  Administered 2015-12-24 – 2015-12-25 (×2): 10 mg via ORAL
  Filled 2015-12-23 (×2): qty 1

## 2015-12-23 MED ORDER — DOCUSATE SODIUM 100 MG PO CAPS
100.0000 mg | ORAL_CAPSULE | Freq: Two times a day (BID) | ORAL | Status: DC
Start: 2015-12-23 — End: 2015-12-26
  Administered 2015-12-23 – 2015-12-26 (×6): 100 mg via ORAL
  Filled 2015-12-23 (×6): qty 1

## 2015-12-23 MED ORDER — METHOCARBAMOL 750 MG PO TABS
750.0000 mg | ORAL_TABLET | Freq: Four times a day (QID) | ORAL | Status: DC | PRN
  Administered 2015-12-24 – 2015-12-26 (×7): 750 mg via ORAL
  Filled 2015-12-23 (×7): qty 1

## 2015-12-23 MED ORDER — HYDROMORPHONE HCL 1 MG/ML IJ SOLN
0.5000 mg | INTRAMUSCULAR | Status: DC | PRN
  Administered 2015-12-23 – 2015-12-26 (×14): 1 mg via INTRAVENOUS
  Filled 2015-12-23 (×14): qty 1

## 2015-12-23 MED ORDER — SUMATRIPTAN SUCCINATE 25 MG PO TABS
25.0000 mg | ORAL_TABLET | Freq: Once | ORAL | Status: AC
  Administered 2015-12-23: 25 mg via ORAL
  Filled 2015-12-23: qty 1

## 2015-12-23 MED ORDER — ONDANSETRON HCL 4 MG/2ML IJ SOLN
4.0000 mg | Freq: Once | INTRAMUSCULAR | Status: AC | PRN
Start: 2015-12-23 — End: 2015-12-23
  Administered 2015-12-23: 4 mg via INTRAVENOUS

## 2015-12-23 MED ORDER — LEVOCETIRIZINE DIHYDROCHLORIDE 5 MG PO TABS: 5.0000 mg | ORAL_TABLET | Freq: Every evening | ORAL | Status: DC

## 2015-12-23 MED ORDER — PROPOFOL 10 MG/ML IV BOLUS
INTRAVENOUS | Status: DC | PRN
Start: 2015-12-23 — End: 2015-12-23
  Administered 2015-12-23 (×2): 20 mg via INTRAVENOUS
  Administered 2015-12-23: 10 mg via INTRAVENOUS

## 2015-12-23 MED ORDER — PROMETHAZINE HCL 25 MG PO TABS
25.0000 mg | ORAL_TABLET | Freq: Four times a day (QID) | ORAL | Status: DC | PRN
  Administered 2015-12-25: 25 mg via ORAL
  Filled 2015-12-23: qty 1

## 2015-12-23 MED ORDER — BUPIVACAINE-EPINEPHRINE (PF) 0.5% -1:200000 IJ SOLN
INTRAMUSCULAR | Status: DC | PRN
  Administered 2015-12-23: 20 mL via PERINEURAL

## 2015-12-23 MED ORDER — BUPIVACAINE-EPINEPHRINE (PF) 0.5% -1:200000 IJ SOLN
INTRAMUSCULAR | Status: AC
  Filled 2015-12-23: qty 30

## 2015-12-23 MED ORDER — BUPIVACAINE LIPOSOME 1.3 % IJ SUSP
INTRAMUSCULAR | Status: DC | PRN
  Administered 2015-12-23: 14:00:00

## 2015-12-23 MED ORDER — CEFAZOLIN SODIUM-DEXTROSE 2-4 GM/100ML-% IV SOLN
2.0000 g | Freq: Four times a day (QID) | INTRAVENOUS | Status: AC
  Administered 2015-12-23: 2 g via INTRAVENOUS
  Filled 2015-12-23 (×2): qty 100

## 2015-12-23 MED ORDER — 0.9 % SODIUM CHLORIDE (POUR BTL) OPTIME
TOPICAL | Status: DC | PRN
Start: 2015-12-23 — End: 2015-12-23
  Administered 2015-12-23: 1000 mL

## 2015-12-23 MED ORDER — CLONAZEPAM 1 MG PO TABS
1.0000 mg | ORAL_TABLET | Freq: Three times a day (TID) | ORAL | Status: DC | PRN
  Administered 2015-12-25 (×2): 1 mg via ORAL
  Filled 2015-12-23 (×2): qty 1

## 2015-12-23 MED ORDER — SODIUM CHLORIDE 0.9 % IV SOLN
2000.0000 mg | INTRAVENOUS | Status: DC | PRN
  Administered 2015-12-23: 2000 mg via TOPICAL

## 2015-12-23 MED ORDER — ONDANSETRON HCL 4 MG/2ML IJ SOLN
INTRAMUSCULAR | Status: AC
  Filled 2015-12-23: qty 2

## 2015-12-23 MED ORDER — METHOCARBAMOL 1000 MG/10ML IJ SOLN
500.0000 mg | Freq: Four times a day (QID) | INTRAVENOUS | Status: DC | PRN
  Filled 2015-12-23: qty 5

## 2015-12-23 MED ORDER — CEFAZOLIN SODIUM-DEXTROSE 2-4 GM/100ML-% IV SOLN
2.0000 g | INTRAVENOUS | Status: AC
  Administered 2015-12-23: 2 g via INTRAVENOUS

## 2015-12-23 MED ORDER — SODIUM CHLORIDE 0.9 % IJ SOLN
INTRAMUSCULAR | Status: DC | PRN
  Administered 2015-12-23 (×4): 10 mL

## 2015-12-23 MED ORDER — CEFAZOLIN SODIUM-DEXTROSE 2-4 GM/100ML-% IV SOLN
INTRAVENOUS | Status: AC
  Filled 2015-12-23: qty 100

## 2015-12-23 MED ORDER — LACTATED RINGERS IV SOLN
INTRAVENOUS | Status: DC
  Administered 2015-12-23: 18:00:00 via INTRAVENOUS

## 2015-12-23 MED ORDER — MIDAZOLAM HCL 2 MG/2ML IJ SOLN
INTRAMUSCULAR | Status: AC
  Filled 2015-12-23: qty 2

## 2015-12-23 MED ORDER — BUPIVACAINE LIPOSOME 1.3 % IJ SUSP
20.0000 mL | INTRAMUSCULAR | Status: DC
  Filled 2015-12-23: qty 20

## 2015-12-23 MED ORDER — ALUM & MAG HYDROXIDE-SIMETH 200-200-20 MG/5ML PO SUSP: 30.0000 mL | ORAL | Status: DC | PRN

## 2015-12-23 MED ORDER — PHENYLEPHRINE HCL 10 MG/ML IJ SOLN
INTRAMUSCULAR | Status: DC | PRN
  Administered 2015-12-23 (×4): 40 ug via INTRAVENOUS

## 2015-12-23 MED ORDER — ACETAMINOPHEN 650 MG RE SUPP: 650.0000 mg | Freq: Four times a day (QID) | RECTAL | Status: DC | PRN

## 2015-12-23 MED ORDER — FENTANYL CITRATE (PF) 100 MCG/2ML IJ SOLN
INTRAMUSCULAR | Status: DC | PRN
  Administered 2015-12-23 (×3): 50 ug via INTRAVENOUS
  Administered 2015-12-23: 100 ug via INTRAVENOUS

## 2015-12-23 MED ORDER — MIDAZOLAM HCL 5 MG/5ML IJ SOLN
INTRAMUSCULAR | Status: DC | PRN
  Administered 2015-12-23: 2 mg via INTRAVENOUS

## 2015-12-23 MED ORDER — TRANEXAMIC ACID 1000 MG/10ML IV SOLN
2000.0000 mg | INTRAVENOUS | Status: DC
  Filled 2015-12-23: qty 20

## 2015-12-23 MED ORDER — ACETAMINOPHEN 325 MG PO TABS
650.0000 mg | ORAL_TABLET | Freq: Four times a day (QID) | ORAL | Status: DC | PRN
  Administered 2015-12-24 – 2015-12-25 (×4): 650 mg via ORAL
  Filled 2015-12-23 (×4): qty 2

## 2015-12-23 MED ORDER — BUPIVACAINE IN DEXTROSE 0.75-8.25 % IT SOLN
INTRATHECAL | Status: DC | PRN
  Administered 2015-12-23: 2 mL via INTRATHECAL

## 2015-12-23 MED ORDER — MEPERIDINE HCL 25 MG/ML IJ SOLN: 6.2500 mg | INTRAMUSCULAR | Status: DC | PRN

## 2015-12-23 MED ORDER — HYDROMORPHONE HCL 1 MG/ML IJ SOLN
INTRAMUSCULAR | Status: AC
  Administered 2015-12-23: 1 mg
  Filled 2015-12-23: qty 1

## 2015-12-23 MED ORDER — HYOSCYAMINE SULFATE ER 0.375 MG PO TB12
0.3750 mg | ORAL_TABLET | Freq: Two times a day (BID) | ORAL | Status: DC | PRN
  Filled 2015-12-23: qty 1

## 2015-12-23 MED ORDER — CYCLOBENZAPRINE HCL 10 MG PO TABS
10.0000 mg | ORAL_TABLET | Freq: Three times a day (TID) | ORAL | Status: DC | PRN
  Administered 2015-12-25 – 2015-12-26 (×2): 10 mg via ORAL
  Filled 2015-12-23 (×2): qty 1

## 2015-12-23 MED ORDER — BISACODYL 5 MG PO TBEC: 5.0000 mg | DELAYED_RELEASE_TABLET | Freq: Every day | ORAL | Status: DC | PRN

## 2015-12-23 MED ORDER — HYDROMORPHONE HCL 1 MG/ML IJ SOLN
INTRAMUSCULAR | Status: AC
  Administered 2015-12-23: 0.5 mg via INTRAVENOUS
  Filled 2015-12-23: qty 1

## 2015-12-23 MED ORDER — HYDROCODONE-ACETAMINOPHEN 5-325 MG PO TABS
1.0000 | ORAL_TABLET | ORAL | Status: DC | PRN
  Administered 2015-12-23 – 2015-12-24 (×3): 2 via ORAL
  Filled 2015-12-23 (×3): qty 2

## 2015-12-23 MED ORDER — DIPHENHYDRAMINE HCL 12.5 MG/5ML PO ELIX: 12.5000 mg | ORAL_SOLUTION | ORAL | Status: DC | PRN

## 2015-12-23 MED ORDER — ONDANSETRON HCL 4 MG PO TABS
4.0000 mg | ORAL_TABLET | Freq: Four times a day (QID) | ORAL | Status: DC | PRN
  Administered 2015-12-25: 4 mg via ORAL
  Filled 2015-12-23: qty 1

## 2015-12-23 MED ORDER — ONDANSETRON HCL 4 MG/2ML IJ SOLN: 4.0000 mg | Freq: Four times a day (QID) | INTRAMUSCULAR | Status: DC | PRN

## 2015-12-23 MED ORDER — MENTHOL 3 MG MT LOZG: 1.0000 | LOZENGE | OROMUCOSAL | Status: DC | PRN

## 2015-12-23 MED ORDER — METOCLOPRAMIDE HCL 5 MG/ML IJ SOLN
5.0000 mg | Freq: Three times a day (TID) | INTRAMUSCULAR | Status: DC | PRN
Start: 2015-12-23 — End: 2015-12-26
  Administered 2015-12-23: 10 mg via INTRAVENOUS
  Filled 2015-12-23: qty 2

## 2015-12-23 MED ORDER — FENTANYL CITRATE (PF) 250 MCG/5ML IJ SOLN
INTRAMUSCULAR | Status: AC
  Filled 2015-12-23: qty 5

## 2015-12-23 MED ORDER — SUMATRIPTAN SUCCINATE 50 MG PO TABS
50.0000 mg | ORAL_TABLET | Freq: Once | ORAL | Status: AC
  Administered 2015-12-23: 50 mg via ORAL
  Filled 2015-12-23: qty 1

## 2015-12-23 MED ORDER — ASPIRIN EC 325 MG PO TBEC
325.0000 mg | DELAYED_RELEASE_TABLET | Freq: Two times a day (BID) | ORAL | Status: DC
  Administered 2015-12-24 – 2015-12-26 (×5): 325 mg via ORAL
  Filled 2015-12-23 (×5): qty 1

## 2015-12-23 MED ORDER — LACTATED RINGERS IV SOLN
INTRAVENOUS | Status: DC
  Administered 2015-12-23 (×3): via INTRAVENOUS

## 2015-12-23 MED ORDER — BUPROPION HCL ER (XL) 150 MG PO TB24
300.0000 mg | ORAL_TABLET | Freq: Every day | ORAL | Status: DC
  Administered 2015-12-24 – 2015-12-26 (×3): 300 mg via ORAL
  Filled 2015-12-23 (×4): qty 2

## 2015-12-23 MED ORDER — FLUOXETINE HCL 20 MG PO CAPS
40.0000 mg | ORAL_CAPSULE | Freq: Every day | ORAL | Status: DC
  Administered 2015-12-23 – 2015-12-25 (×3): 40 mg via ORAL
  Filled 2015-12-23 (×3): qty 2

## 2015-12-23 MED ORDER — TRANEXAMIC ACID 1000 MG/10ML IV SOLN
1000.0000 mg | INTRAVENOUS | Status: AC
  Administered 2015-12-23: 1000 mg via INTRAVENOUS
  Filled 2015-12-23: qty 10

## 2015-12-23 MED ORDER — PHENOL 1.4 % MT LIQD: 1.0000 | OROMUCOSAL | Status: DC | PRN

## 2015-12-23 MED ORDER — PROPOFOL 500 MG/50ML IV EMUL
INTRAVENOUS | Status: DC | PRN
  Administered 2015-12-23: 100 ug/kg/min via INTRAVENOUS

## 2015-12-23 MED ORDER — PROGESTERONE MICRONIZED 100 MG PO CAPS
100.0000 mg | ORAL_CAPSULE | Freq: Every day | ORAL | Status: DC
  Administered 2015-12-24 – 2015-12-26 (×3): 100 mg via ORAL
  Filled 2015-12-23 (×4): qty 1

## 2015-12-23 MED ORDER — HYDROMORPHONE HCL 1 MG/ML IJ SOLN
0.2500 mg | INTRAMUSCULAR | Status: DC | PRN
  Administered 2015-12-23 (×2): 0.5 mg via INTRAVENOUS

## 2015-12-23 MED ORDER — PROMETHAZINE HCL 25 MG/ML IJ SOLN
12.5000 mg | Freq: Four times a day (QID) | INTRAMUSCULAR | Status: DC | PRN
  Administered 2015-12-23 – 2015-12-24 (×2): 25 mg via INTRAVENOUS
  Administered 2015-12-24: 12.5 mg via INTRAVENOUS
  Filled 2015-12-23 (×3): qty 1

## 2015-12-23 SURGICAL SUPPLY — 65 items
BAG DECANTER FOR FLEXI CONT (MISCELLANEOUS) IMPLANT
BANDAGE ELASTIC 4 VELCRO ST LF (GAUZE/BANDAGES/DRESSINGS) ×2 IMPLANT
BANDAGE ELASTIC 6 VELCRO ST LF (GAUZE/BANDAGES/DRESSINGS) ×1 IMPLANT
BANDAGE ESMARK 6X9 LF (GAUZE/BANDAGES/DRESSINGS) ×1 IMPLANT
BLADE SAGITTAL 25.0X1.19X90 (BLADE) IMPLANT
BLADE SAW SGTL 13.0X1.19X90.0M (BLADE) IMPLANT
BLADE SURG ROTATE 9660 (MISCELLANEOUS) IMPLANT
BNDG CMPR 9X6 STRL LF SNTH (GAUZE/BANDAGES/DRESSINGS) ×1
BNDG CMPR MED 10X6 ELC LF (GAUZE/BANDAGES/DRESSINGS) ×1
BNDG ELASTIC 6X10 VLCR STRL LF (GAUZE/BANDAGES/DRESSINGS) ×2 IMPLANT
BNDG ESMARK 6X9 LF (GAUZE/BANDAGES/DRESSINGS) ×2
BNDG GAUZE ELAST 4 BULKY (GAUZE/BANDAGES/DRESSINGS) ×3 IMPLANT
BOWL SMART MIX CTS (DISPOSABLE) ×2 IMPLANT
CAP KNEE TOTAL 3 SIGMA ×1 IMPLANT
CEMENT HV SMART SET (Cement) ×4 IMPLANT
CLSR STERI-STRIP ANTIMIC 1/2X4 (GAUZE/BANDAGES/DRESSINGS) ×1 IMPLANT
COVER SURGICAL LIGHT HANDLE (MISCELLANEOUS) ×2 IMPLANT
CUFF TOURNIQUET SINGLE 34IN LL (TOURNIQUET CUFF) ×2 IMPLANT
CUFF TOURNIQUET SINGLE 44IN (TOURNIQUET CUFF) IMPLANT
DECANTER SPIKE VIAL GLASS SM (MISCELLANEOUS) ×2 IMPLANT
DRAPE EXTREMITY T 121X128X90 (DRAPE) ×2 IMPLANT
DRAPE PROXIMA HALF (DRAPES) ×2 IMPLANT
DRAPE U-SHAPE 47X51 STRL (DRAPES) ×2 IMPLANT
DRSG ADAPTIC 3X8 NADH LF (GAUZE/BANDAGES/DRESSINGS) ×2 IMPLANT
DRSG PAD ABDOMINAL 8X10 ST (GAUZE/BANDAGES/DRESSINGS) ×2 IMPLANT
DURAPREP 26ML APPLICATOR (WOUND CARE) ×2 IMPLANT
ELECT REM PT RETURN 9FT ADLT (ELECTROSURGICAL) ×2
ELECTRODE REM PT RTRN 9FT ADLT (ELECTROSURGICAL) ×1 IMPLANT
GAUZE SPONGE 4X4 12PLY STRL (GAUZE/BANDAGES/DRESSINGS) ×2 IMPLANT
GLOVE BIO SURGEON STRL SZ8 (GLOVE) ×4 IMPLANT
GLOVE BIOGEL PI IND STRL 8 (GLOVE) ×2 IMPLANT
GLOVE BIOGEL PI INDICATOR 8 (GLOVE) ×2
GOWN STRL REUS W/ TWL LRG LVL3 (GOWN DISPOSABLE) ×1 IMPLANT
GOWN STRL REUS W/ TWL XL LVL3 (GOWN DISPOSABLE) ×2 IMPLANT
GOWN STRL REUS W/TWL LRG LVL3 (GOWN DISPOSABLE) ×2
GOWN STRL REUS W/TWL XL LVL3 (GOWN DISPOSABLE) ×4
HANDPIECE INTERPULSE COAX TIP (DISPOSABLE) ×2
HOOD PEEL AWAY FACE SHEILD DIS (HOOD) ×4 IMPLANT
IMMOBILIZER KNEE 22 UNIV (SOFTGOODS) ×2 IMPLANT
KIT BASIN OR (CUSTOM PROCEDURE TRAY) ×2 IMPLANT
KIT ROOM TURNOVER OR (KITS) ×2 IMPLANT
MANIFOLD NEPTUNE II (INSTRUMENTS) ×2 IMPLANT
NDL HYPO 21X1 ECLIPSE (NEEDLE) ×1 IMPLANT
NEEDLE HYPO 21X1 ECLIPSE (NEEDLE) ×2 IMPLANT
NS IRRIG 1000ML POUR BTL (IV SOLUTION) ×2 IMPLANT
PACK TOTAL JOINT (CUSTOM PROCEDURE TRAY) ×2 IMPLANT
PACK UNIVERSAL I (CUSTOM PROCEDURE TRAY) ×2 IMPLANT
PAD ARMBOARD 7.5X6 YLW CONV (MISCELLANEOUS) ×4 IMPLANT
SET HNDPC FAN SPRY TIP SCT (DISPOSABLE) ×1 IMPLANT
SPONGE GAUZE 4X4 12PLY STER LF (GAUZE/BANDAGES/DRESSINGS) ×1 IMPLANT
STAPLER VISISTAT 35W (STAPLE) IMPLANT
STRIP CLOSURE SKIN 1/2X4 (GAUZE/BANDAGES/DRESSINGS) ×2 IMPLANT
SUCTION FRAZIER HANDLE 10FR (MISCELLANEOUS)
SUCTION TUBE FRAZIER 10FR DISP (MISCELLANEOUS) IMPLANT
SUT MNCRL AB 3-0 PS2 18 (SUTURE) IMPLANT
SUT VIC AB 0 CT1 27 (SUTURE) ×4
SUT VIC AB 0 CT1 27XBRD ANBCTR (SUTURE) ×2 IMPLANT
SUT VIC AB 2-0 CT1 27 (SUTURE) ×4
SUT VIC AB 2-0 CT1 TAPERPNT 27 (SUTURE) ×2 IMPLANT
SUT VLOC 180 0 24IN GS25 (SUTURE) ×2 IMPLANT
SYR 50ML LL SCALE MARK (SYRINGE) ×2 IMPLANT
TOWEL OR 17X24 6PK STRL BLUE (TOWEL DISPOSABLE) ×2 IMPLANT
TOWEL OR 17X26 10 PK STRL BLUE (TOWEL DISPOSABLE) ×2 IMPLANT
TRAY FOLEY CATH 14FR (SET/KITS/TRAYS/PACK) IMPLANT
WATER STERILE IRR 1000ML POUR (IV SOLUTION) ×4 IMPLANT

## 2015-12-23 NOTE — Progress Notes (Signed)
Orthopedic Tech Progress Note Patient Details:  Gabriela Shepard 27-Aug-1960 [id number]  CPM Left Knee CPM Left Knee: On Left Knee Flexion (Degrees): 70 Left Knee Extension (Degrees): 0  Ortho Devices Ortho Device/Splint Location: applied ohf to bed Ortho Device/Splint Interventions: Ordered, Application, Adjustment   [name] 12/23/2015, 7:37 PM

## 2015-12-23 NOTE — Anesthesia Postprocedure Evaluation (Signed)
Anesthesia Post Note  Patient: Gabriela Shepard  Procedure(s) Performed: Procedure(s) (LRB): TOTAL KNEE ARTHROPLASTY (Left)  Patient location during evaluation: PACU Anesthesia Type: Regional Level of consciousness: awake and alert and patient cooperative Pain management: pain level controlled Vital Signs Assessment: post-procedure vital signs reviewed and stable Respiratory status: spontaneous breathing and respiratory function stable Cardiovascular status: stable Anesthetic complications: no    Last Vitals:  Filed Vitals:   12/23/15 1517 12/23/15 1532  BP: 116/80 125/81  Pulse: 54 57  Temp:    Resp: 11 9    Last Pain:  Filed Vitals:   12/23/15 1534  PainSc: 8                  [name] DAVID

## 2015-12-23 NOTE — Anesthesia Preprocedure Evaluation (Signed)
Anesthesia Evaluation  Patient identified by MRN, date of birth, ID band Patient awake    Airway Mallampati: I  TM Distance: >3 FB Neck ROM: Full    Dental   Pulmonary former smoker,    Pulmonary exam normal        Cardiovascular Normal cardiovascular exam     Neuro/Psych  Headaches, Depression    GI/Hepatic   Endo/Other    Renal/GU      Musculoskeletal   Abdominal   Peds  Hematology   Anesthesia Other Findings   Reproductive/Obstetrics                             Anesthesia Physical Anesthesia Plan  ASA: II  Anesthesia Plan: Spinal   Post-op Pain Management:    Induction: Intravenous  Airway Management Planned: Natural Airway  Additional Equipment:   Intra-op Plan:   Post-operative Plan:   Informed Consent: I have reviewed the patients History and Physical, chart, labs and discussed the procedure including the risks, benefits and alternatives for the proposed anesthesia with the patient or authorized representative who has indicated his/her understanding and acceptance.     Plan Discussed with: CRNA and Surgeon  Anesthesia Plan Comments:         Anesthesia Quick Evaluation

## 2015-12-23 NOTE — Interval H&P Note (Signed)
History and Physical Interval Note:  12/23/2015 12:02 PM  Gabriela Shepard  has presented today for surgery, with the diagnosis of LEFT KNEE DEGENERATIVE JOINT DISEASE  The various methods of treatment have been discussed with the patient and family. After consideration of risks, benefits and other options for treatment, the patient has consented to  Procedure(s): TOTAL KNEE ARTHROPLASTY (Left) as a surgical intervention .  The patient's history has been reviewed, patient examined, no change in status, stable for surgery.  I have reviewed the patient's chart and labs.  Questions were answered to the patient's satisfaction.     Levander Katzenstein G

## 2015-12-23 NOTE — Op Note (Signed)
PREOP DIAGNOSIS: DJD LEFT KNEE POSTOP DIAGNOSIS:  same PROCEDURE: LEFT TKR ANESTHESIA: Spinal and MAC ATTENDING SURGEON: Maimouna Rondeau G ASSISTANT: Elodia FlorenceAndrew Nida PA  INDICATIONS FOR PROCEDURE: Gabriela MossLisa B Retz is a 55 y.o. female who has struggled for a long time with pain due to degenerative arthritis of the left knee.  The patient has failed many conservative non-operative measures and at this point has pain which limits the ability to sleep and walk.  The patient is offered total knee replacement.  Informed operative consent was obtained after discussion of possible risks of anesthesia, infection, neurovascular injury, DVT, and death.  The importance of the post-operative rehabilitation protocol to optimize result was stressed extensively with the patient.  SUMMARY OF FINDINGS AND PROCEDURE:  Gabriela Shepard was taken to the operative suite where under the above anesthesia a left knee replacement was performed.  There were advanced degenerative changes and the bone quality was good.  We used the DePuyLCS system and placed size standard femur, 3 tibia, 32 mm all polyethylene patella, and a size 10 mm spacer.  Elodia FlorenceAndrew Nida PA-C assisted throughout and was invaluable to the completion of the case in that he helped retract and maintain exposure while I placed the components.  He also helped close thereby minimizing OR time.  The patient was admitted for appropriate post-op care to include perioperative antibiotics and mechanical and pharmacologic measures for DVT prophylaxis.  DESCRIPTION OF PROCEDURE:  Gabriela MossLisa B Kastens was taken to the operative suite where the above anesthesia was applied.  The patient was positioned supine and prepped and draped in normal sterile fashion.  An appropriate time out was performed.  After the administration of kefzol pre-op antibiotic the leg was elevated and exsanguinated and a tourniquet inflated.  A standard longitudinal incision was made on the anterior knee.   Dissection was carried down to the extensor mechanism.  All appropriate anti-infective measures were used including the pre-operative antibiotic, betadine impregnated drape, and closed hooded exhaust systems for each member of the surgical team.  A medial parapatellar incision was made in the extensor mechanism and the knee cap flipped and the knee flexed.  Some residual meniscal tissues were removed along with any remaining ACL/PCL tissue.  A guide was placed on the tibia and a flat cut was made on it's superior surface.  An intramedullary guide was placed in the femur and was utilized to make anterior and posterior cuts creating an appropriate flexion gap.  A second intramedullary guide was placed in the femur to make a distal cut properly balancing the knee with an extension gap equal to the flexion gap.  The three bones sized to the above mentioned sizes and the appropriate guides were placed and utilized.  A trial reduction was done and the knee easily came to full extension and the patella tracked well on flexion.  The trial components were removed and all bones were cleaned with pulsatile lavage and then dried thoroughly.  Cement was mixed and was pressurized onto the bones followed by placement of the aforementioned components.  Excess cement was trimmed and pressure was held on the components until the cement had hardened.  The tourniquet was deflated and a small amount of bleeding was controlled with cautery and pressure.  The knee was irrigated thoroughly.  The extensor mechanism was re-approximated with V-loc suture in running fashion.  The knee was flexed and the repair was solid.  The subcutaneous tissues were re-approximated with #0 and #2-0 vicryl and the skin closed  with a subcuticular stitch and steristrips.  A sterile dressing was applied.  Intraoperative fluids, EBL, and tourniquet time can be obtained from anesthesia records.  DISPOSITION:  The patient was taken to recovery room in stable  condition and admitted for appropriate post-op care to include peri-operative antibiotic and DVT prophylaxis with mechanical and pharmacologic measures.  Yukio Bisping G 12/23/2015, 2:45 PM

## 2015-12-23 NOTE — Anesthesia Procedure Notes (Signed)
Spinal Patient location during procedure: OR Start time: 12/23/2015 1:05 PM End time: 12/23/2015 1:10 PM Staffing Anesthesiologist: Lillia Abed Performed by: anesthesiologist  Preanesthetic Checklist Completed: patient identified, site marked, surgical consent, pre-op evaluation, timeout performed, IV checked, risks and benefits discussed and monitors and equipment checked Spinal Block Patient position: sitting Prep: Betadine Patient monitoring: heart rate, cardiac monitor, continuous pulse ox and blood pressure Approach: right paramedian Location: L3-4 Injection technique: single-shot Needle Needle type: Pencan  Needle gauge: 24 G Needle length: 9 cm Needle insertion depth: 8 cm

## 2015-12-23 NOTE — Transfer of Care (Signed)
Immediate Anesthesia Transfer of Care Note  Patient: Gabriela Shepard  Procedure(s) Performed: Procedure(s): TOTAL KNEE ARTHROPLASTY (Left)  Patient Location: PACU  Anesthesia Type:MAC and Spinal  Level of Consciousness: awake, alert , oriented and patient cooperative  Airway & Oxygen Therapy: Patient Spontanous Breathing and Patient connected to nasal cannula oxygen  Post-op Assessment: Report given to RN and Post -op Vital signs reviewed and stable  Post vital signs: Reviewed and stable  Last Vitals:  Filed Vitals:   12/23/15 1132 12/23/15 1503  BP: 128/95   Pulse: 72   Temp: 36.8 C 36.6 C  Resp: 20     Last Pain:  Filed Vitals:   12/23/15 1503  PainSc: 4          Complications: No apparent anesthesia complications

## 2015-12-24 ENCOUNTER — Encounter (HOSPITAL_COMMUNITY): Payer: Self-pay | Admitting: Orthopaedic Surgery

## 2015-12-24 MED ORDER — OXYCODONE HCL 5 MG PO TABS
5.0000 mg | ORAL_TABLET | ORAL | Status: DC | PRN
  Administered 2015-12-24 – 2015-12-26 (×13): 10 mg via ORAL
  Filled 2015-12-24 (×12): qty 2

## 2015-12-24 NOTE — Progress Notes (Signed)
Physical Therapy Treatment Patient Details Name: Gabriela Shepard MRN: [id number] DOB: 04-19-1961 Today's Date: 12/24/2015    History of Present Illness Pt presents for left TKA.     PT Comments    Pt ambulated 60' with RW and min-guard A. Was also able to urinate small amount on toilet. Left in CPM 0-70 degrees. PT will continue to follow.  Follow Up Recommendations  SNF     Equipment Recommendations  Rolling walker with 5" wheels    Recommendations for Other Services       Precautions / Restrictions Precautions Precautions: Knee Precaution Booklet Issued: No Precaution Comments: reviewed proper positioning Required Braces or Orthoses: Knee Immobilizer - Left Knee Immobilizer - Left: Other (comment) (until discontinued) Restrictions Weight Bearing Restrictions: No LLE Weight Bearing: Weight bearing as tolerated    Mobility  Bed Mobility Overal bed mobility: Needs Assistance Bed Mobility: Sit to Supine     Supine to sit: Supervision Sit to supine: Min assist   General bed mobility comments: min A to LLE for return to bed and positioning in CPM  Transfers Overall transfer level: Needs assistance Equipment used: Rolling walker (2 wheeled) Transfers: Sit to/from Stand Sit to Stand: Min guard         General transfer comment: reminder of safe hand placement and min-guard for safety from chair and BSC  Ambulation/Gait Ambulation/Gait assistance: Min guard Ambulation Distance (Feet): 40 Feet Assistive device: Rolling walker (2 wheeled) Gait Pattern/deviations: Step-through pattern;Decreased weight shift to left;Decreased stance time - left;Decreased step length - left Gait velocity: decreased Gait velocity interpretation: Below normal speed for age/gender General Gait Details: pt with increased pain with ambulation, encouragement to increase distance   Stairs            Wheelchair Mobility    Modified Rankin (Stroke Patients Only)        Balance Overall balance assessment: Needs assistance Sitting-balance support: No upper extremity supported Sitting balance-Leahy Scale: Good     Standing balance support: No upper extremity supported Standing balance-Leahy Scale: Fair Standing balance comment: able to stand at sink to wash hands without UE support                    Cognition Arousal/Alertness: Awake/alert Behavior During Therapy: WFL for tasks assessed/performed Overall Cognitive Status: Within Functional Limits for tasks assessed                      Exercises Total Joint Exercises Ankle Circles/Pumps: AROM;Both;10 reps;Supine Quad Sets: AROM;Both;10 reps;Seated Gluteal Sets: AROM;Both;10 reps;Supine Heel Slides: AAROM;Left;10 reps;Seated Straight Leg Raises: AAROM;Left;5 reps;Supine Long Arc Quad: AROM;Left;10 reps;Seated Goniometric ROM: 10-60    General Comments General comments (skin integrity, edema, etc.): pt abkle to urinate minimally in toilet      Pertinent Vitals/Pain Pain Assessment: Faces Faces Pain Scale: Hurts even more Pain Location: left knee Pain Descriptors / Indicators: Sore Pain Intervention(s): Limited activity within patient's tolerance;Monitored during session;Repositioned    Home Living Family/patient expects to be discharged to:: Other (Comment) Living Arrangements: Alone                  Prior Function Level of Independence: Independent          PT Goals (current goals can now be found in the care plan section) Acute Rehab PT Goals Patient Stated Goal: return home after rehab at [facility] PT Goal Formulation: With patient Time For Goal Achievement: 12/31/15 Potential to Achieve Goals: Good Progress towards  PT goals: Progressing toward goals    Frequency  7X/week    PT Plan Current plan remains appropriate    Co-evaluation             End of Session Equipment Utilized During Treatment: Gait belt Activity Tolerance: Patient tolerated  treatment well Patient left: with call bell/phone within reach;in bed;in CPM;with family/visitor present     Time: [id number] PT Time Calculation (min) (ACUTE ONLY): 30 min  Charges:  $Gait Training: 8-22 mins $Therapeutic Activity: 8-22 mins                    G Codes:     Leighton Roach, PT  Acute Rehab Services  [phone]  Leighton Roach 12/24/2015, 4:28 PM

## 2015-12-24 NOTE — NC FL2 (Signed)
[place] LEVEL OF CARE SCREENING TOOL     IDENTIFICATION  Patient Name: Gabriela Shepard Birthdate: 1960/09/13 Sex: female Admission Date (Current Location): 12/23/2015  [facility] and [place] Number:  Herbalist and Address:  [facility] [place]. [facility], [place] [address] [place], [place] [address]      Provider Number:    Attending Physician Name and Address:  Melrose Nakayama, MD  Relative Name and Phone Number:       Current Level of Care: Hospital Recommended Level of Care: [place] Prior Approval Number:    Date Approved/Denied:   PASRR Number: [id number]  Discharge Plan: SNF    Current Diagnoses: Patient Active Problem List   Diagnosis Date Noted  . Primary osteoarthritis of left knee 12/23/2015  . IBS (irritable bowel syndrome) 02/19/2015  . Postmenopausal 12/19/2013  . Migraine headache 12/05/2012  . Pain, upper back 09/24/2010  . Depression 09/04/2010    Orientation RESPIRATION BLADDER Height & Weight     Self, Time, Situation, Place  Normal Continent Weight: 154 lb (69.854 kg) Height:     BEHAVIORAL SYMPTOMS/MOOD NEUROLOGICAL BOWEL NUTRITION STATUS     (none) Continent Diet (Regular)  AMBULATORY STATUS COMMUNICATION OF NEEDS Skin   Extensive Assist Verbally Surgical wounds (Incision Closed: LT Knee Compression wrap)                       Personal Care Assistance Level of Assistance  Bathing, Feeding, Dressing Bathing Assistance: Limited assistance Feeding assistance: Independent Dressing Assistance: Limited assistance     Functional Limitations Info  Sight, Hearing, Speech Sight Info: Adequate Hearing Info: Adequate Speech Info: Adequate    SPECIAL CARE FACTORS FREQUENCY  PT (By licensed PT), OT (By licensed OT)     PT Frequency: 5/ week OT Frequency: 5/ week            Contractures Contractures Info: Not present    Additional Factors Info  Code Status, Allergies, Psychotropic  Code Status Info: Full Allergies Info: Sulfa Antibiotics Psychotropic Info: buPROPion (WELLBUTRIN XL) 24 hr tablet 300 mg Dose: 300 mg Freq: Daily Route: PO, FLUoxetine (PROZAC) capsule 40 mg Dose: 40 mg Freq: Daily at bedtime Route: PO         Current Medications (12/24/2015):  This is the current hospital active medication list Current Facility-Administered Medications  Medication Dose Route Frequency Provider Last Rate Last Dose  . acetaminophen (TYLENOL) tablet 650 mg  650 mg Oral Q6H PRN Loni Dolly, PA-C       Or  . acetaminophen (TYLENOL) suppository 650 mg  650 mg Rectal Q6H PRN Loni Dolly, PA-C      . alum & mag hydroxide-simeth (MAALOX/MYLANTA) 200-200-20 MG/5ML suspension 30 mL  30 mL Oral Q4H PRN Loni Dolly, PA-C      . aspirin EC tablet 325 mg  325 mg Oral BID PC Loni Dolly, PA-C   325 mg at 12/24/15 0815  . bisacodyl (DULCOLAX) EC tablet 5 mg  5 mg Oral Daily PRN Loni Dolly, PA-C      . buPROPion (WELLBUTRIN XL) 24 hr tablet 300 mg  300 mg Oral Daily Loni Dolly, PA-C   300 mg at 12/24/15 0815  . clonazePAM (KLONOPIN) tablet 1 mg  1 mg Oral TID PRN Loni Dolly, PA-C      . cyclobenzaprine (FLEXERIL) tablet 10 mg  10 mg Oral TID PRN Loni Dolly, PA-C      . diphenhydrAMINE (BENADRYL) 12.5  MG/5ML elixir 12.5-25 mg  12.5-25 mg Oral Q4H PRN Loni Dolly, PA-C      . docusate sodium (COLACE) capsule 100 mg  100 mg Oral BID Loni Dolly, PA-C   100 mg at 12/24/15 0815  . FLUoxetine (PROZAC) capsule 40 mg  40 mg Oral QHS Loni Dolly, PA-C   40 mg at 12/23/15 2021  . HYDROmorphone (DILAUDID) injection 0.5-1 mg  0.5-1 mg Intravenous Q3H PRN Loni Dolly, PA-C   1 mg at 12/24/15 1423  . hyoscyamine (LEVBID) 0.375 MG 12 hr tablet 0.375 mg  0.375 mg Oral Q12H PRN Loni Dolly, PA-C      . lactated ringers infusion   Intravenous Continuous Loni Dolly, PA-C 50 mL/hr at 12/23/15 1755    . loratadine (CLARITIN) tablet 10 mg  10 mg Oral q1800 Melrose Nakayama, MD      . menthol-cetylpyridinium  (CEPACOL) lozenge 3 mg  1 lozenge Oral PRN Loni Dolly, PA-C       Or  . phenol (CHLORASEPTIC) mouth spray 1 spray  1 spray Mouth/Throat PRN Loni Dolly, PA-C      . methocarbamol (ROBAXIN) tablet 750 mg  750 mg Oral Q6H PRN Loni Dolly, PA-C   750 mg at 12/24/15 1525   Or  . methocarbamol (ROBAXIN) 500 mg in dextrose 5 % 50 mL IVPB  500 mg Intravenous Q6H PRN Loni Dolly, PA-C      . metoCLOPramide (REGLAN) tablet 5-10 mg  5-10 mg Oral Q8H PRN Loni Dolly, PA-C       Or  . metoCLOPramide (REGLAN) injection 5-10 mg  5-10 mg Intravenous Q8H PRN Loni Dolly, PA-C   10 mg at 12/23/15 1832  . ondansetron (ZOFRAN) tablet 4 mg  4 mg Oral Q6H PRN Loni Dolly, PA-C       Or  . ondansetron [facility]) injection 4 mg  4 mg Intravenous Q6H PRN Loni Dolly, PA-C      . oxyCODONE (Oxy IR/ROXICODONE) immediate release tablet 5-10 mg  5-10 mg Oral Q4H PRN Loni Dolly, PA-C   10 mg at 12/24/15 1251  . progesterone (PROMETRIUM) capsule 100 mg  100 mg Oral Daily Loni Dolly, PA-C   100 mg at 12/24/15 0816  . promethazine (PHENERGAN) injection 12.5-25 mg  12.5-25 mg Intravenous Q6H PRN Loni Dolly, PA-C   12.5 mg at 12/24/15 1345  . promethazine (PHENERGAN) tablet 25 mg  25 mg Oral Q6H PRN Loni Dolly, PA-C         Discharge Medications: Please see discharge summary for a list of discharge medications.  Relevant Imaging Results:  Relevant Lab Results:   Additional Information SSN: [id number]  [name], LCSW

## 2015-12-24 NOTE — Evaluation (Signed)
Physical Therapy Evaluation Patient Details Name: Gabriela Shepard MRN: [id number] DOB: 04-30-1961 Today's Date: 12/24/2015   History of Present Illness  Pt presents for left TKA.   Clinical Impression  Pt is s/p TKA resulting in the deficits listed below (see PT Problem List). Pt ambulated 35' with RW and min A, nauseous with activity.  Pt will benefit from skilled PT to increase their independence and safety with mobility to allow discharge to the venue listed below.      Follow Up Recommendations SNF    Equipment Recommendations  Rolling walker with 5" wheels    Recommendations for Other Services       Precautions / Restrictions Precautions Precautions: Knee Precaution Booklet Issued: No Precaution Comments: reviewed proper positioning Required Braces or Orthoses: Knee Immobilizer - Left Knee Immobilizer - Left: Other (comment) (until discontinued) Restrictions Weight Bearing Restrictions: No LLE Weight Bearing: Weight bearing as tolerated      Mobility  Bed Mobility Overal bed mobility: Needs Assistance Bed Mobility: Supine to Sit     Supine to sit: Supervision     General bed mobility comments: pt able to sit up and slide LLE to EOB with assistance from hands. No physical assist from therapist needed  Transfers Overall transfer level: Needs assistance Equipment used: Rolling walker (2 wheeled) Transfers: Sit to/from Stand Sit to Stand: Min assist         General transfer comment: min A to steady and vc's for hand placement  Ambulation/Gait Ambulation/Gait assistance: Min assist Ambulation Distance (Feet): 30 Feet Assistive device: Rolling walker (2 wheeled) Gait Pattern/deviations: Step-to pattern;Decreased weight shift to left;Decreased stance time - left;Decreased step length - left Gait velocity: decreased Gait velocity interpretation: Below normal speed for age/gender General Gait Details: pt nauseous with ambulation, RN notified.   Stairs            Wheelchair Mobility    Modified Rankin (Stroke Patients Only)       Balance Overall balance assessment: Needs assistance Sitting-balance support: Feet supported;No upper extremity supported Sitting balance-Leahy Scale: Good     Standing balance support: Bilateral upper extremity supported Standing balance-Leahy Scale: Fair                               Pertinent Vitals/Pain Pain Assessment: Faces Faces Pain Scale: Hurts even more Pain Location: left knee Pain Descriptors / Indicators: Sore Pain Intervention(s): Limited activity within patient's tolerance;Monitored during session;Premedicated before session;Repositioned    Home Living Family/patient expects to be discharged to:: Other (Comment) Living Arrangements: Alone                    Prior Function Level of Independence: Independent               Hand Dominance        Extremity/Trunk Assessment   Upper Extremity Assessment: Defer to OT evaluation           Lower Extremity Assessment: LLE deficits/detail   LLE Deficits / Details: hip flex 2-/5, knee ext 2/5, knee flex 2/5  Cervical / Trunk Assessment: Normal  Communication   Communication: No difficulties  Cognition Arousal/Alertness: Awake/alert Behavior During Therapy: WFL for tasks assessed/performed Overall Cognitive Status: Within Functional Limits for tasks assessed                      General Comments      Exercises Total Joint Exercises  Ankle Circles/Pumps: AROM;Both;10 reps;Supine Quad Sets: AROM;Both;10 reps;Seated Gluteal Sets: AROM;Both;10 reps;Supine Straight Leg Raises: AAROM;Left;5 reps;Supine Long Arc Quad: AROM;Left;10 reps;Seated Goniometric ROM: 10-60      Assessment/Plan    PT Assessment Patient needs continued PT services  PT Diagnosis Difficulty walking;Abnormality of gait;Acute pain   PT Problem List Decreased strength;Decreased range of motion;Decreased activity  tolerance;Decreased balance;Decreased mobility;Decreased knowledge of use of DME;Decreased knowledge of precautions;Pain  PT Treatment Interventions DME instruction;Gait training;Stair training;Functional mobility training;Therapeutic activities;Therapeutic exercise;Balance training;Patient/family education;Neuromuscular re-education   PT Goals (Current goals can be found in the Care Plan section) Acute Rehab PT Goals Patient Stated Goal: return home after rehab at [facility] PT Goal Formulation: With patient Time For Goal Achievement: 12/31/15 Potential to Achieve Goals: Good    Frequency 7X/week   Barriers to discharge Decreased caregiver support lives alone    Co-evaluation               End of Session Equipment Utilized During Treatment: Gait belt Activity Tolerance: Other (comment) (limited by nausea) Patient left: in chair;with call bell/phone within reach Nurse Communication: Mobility status;Other (comment) (nausea)         Time: [id number] PT Time Calculation (min) (ACUTE ONLY): 27 min   Charges:   PT Evaluation $PT Eval Low Complexity: 1 Procedure PT Treatments $Gait Training: 8-22 mins   PT G Codes:      Leighton Roach, PT  Acute Rehab Services  [place], [place] 12/24/2015, 2:53 PM

## 2015-12-24 NOTE — Progress Notes (Signed)
OT Cancellation Note  Patient Details Name: Gabriela Shepard MRN: [id number] DOB: January 31, 1961   Cancelled Treatment:    Reason Eval/Treat Not Completed:  Pt just returned to bed and in CPM. Will follow.  Malka So 12/24/2015, 4:03 PM  [phone]

## 2015-12-24 NOTE — Progress Notes (Signed)
Subjective: 1 Day Post-Op Procedure(s) (LRB): TOTAL KNEE ARTHROPLASTY (Left)   Patient having some urinary retention. They did an in and out cath last night and she will likely need another one this morning. She is having continued pain and is asking to change the medications.   Activity level:  wbat Diet tolerance:  ok Voiding:  Difficulty urinating. Patient reports pain as moderate.    Objective: Vital signs in last 24 hours: Temp:  [97.7 F (36.5 C)-98.7 F (37.1 C)] 98.7 F (37.1 C) (07/19 0430) Pulse Rate:  [54-74] 71 (07/19 0430) Resp:  [7-20] 18 (07/19 0430) BP: (109-144)/(72-95) 120/80 mmHg (07/19 0430) SpO2:  [97 %-100 %] 98 % (07/19 0430) Weight:  [69.854 kg (154 lb)] 69.854 kg (154 lb) (07/18 1132)  Labs: No results for input(s): HGB in the last 72 hours. No results for input(s): WBC, RBC, HCT, PLT in the last 72 hours. No results for input(s): NA, K, CL, CO2, BUN, CREATININE, GLUCOSE, CALCIUM in the last 72 hours. No results for input(s): LABPT, INR in the last 72 hours.  Physical Exam:  Neurologically intact ABD soft Neurovascular intact Sensation intact distally Intact pulses distally Dorsiflexion/Plantar flexion intact Incision: dressing C/D/I and no drainage No cellulitis present Compartment soft  Assessment/Plan:  1 Day Post-Op Procedure(s) (LRB): TOTAL KNEE ARTHROPLASTY (Left) Advance diet Up with therapy Plan for discharge tomorrow She is deciding on either going to [facility] or home with HHPT. We will see how she progresses.  I will change dressing to aquacel tomorrow.  Continue on ASA 325mg  BID x 2 weeks post op. I have changed norco to oxycodone. Follow up in office 2 weeks post op.  Lawrnce Reyez, Larwance Sachs 12/24/2015, 7:26 AM

## 2015-12-24 NOTE — Progress Notes (Signed)
Shift notes: patient is alert, orient ed x4, resting in the bed at present time, no acute distress noted, has been medicated few times for pain, was unable to urinate, bladder scanned with >999, was catheterized with 925 ml clear yellow urine without difficulty, she tolerated well, dressing looks clean, dry and intact to left knee. Will continue to monitor.

## 2015-12-24 NOTE — Progress Notes (Signed)
Patient still unable to void. Bladder scan done showed 774ml. Second in and out cath done got out 476ml. P.A. Was notified during morning rounds. Reported off to oncoming morning RN.

## 2015-12-25 ENCOUNTER — Encounter (HOSPITAL_COMMUNITY): Payer: Self-pay | Admitting: General Practice

## 2015-12-25 NOTE — Progress Notes (Signed)
Orthopedic Tech Progress Note Patient Details:  Gabriela Shepard 07-22-1960 [id number]  CPM Left Knee CPM Left Knee: On Left Knee Flexion (Degrees): 60 Left Knee Extension (Degrees): 0   [place] Pink 12/25/2015, 2:23 PM

## 2015-12-25 NOTE — Progress Notes (Signed)
Physical Therapy Treatment Patient Details Name: Gabriela Shepard MRN: [id number] DOB: February 22, 1961 Today's Date: 12/25/2015    History of Present Illness Pt presents for left TKA.     PT Comments    Patient is gradually progressing toward mobility goals. Pt continues to have difficulty with WB on L LE and with decreased R foot clearance. Required rest breaks during ambulation due to c/o pain and lightheadedness. Continue to recommend SNF for further skilled PT services to maximize independence and safety with mobility to return home alone.   Follow Up Recommendations  SNF     Equipment Recommendations  Rolling walker with 5" wheels    Recommendations for Other Services       Precautions / Restrictions Precautions Precautions: Knee Precaution Booklet Issued: No Precaution Comments: reviewed proper positioning Required Braces or Orthoses: Knee Immobilizer - Left Knee Immobilizer - Left: Other (comment) (until discontinued) Restrictions Weight Bearing Restrictions: Yes LLE Weight Bearing: Weight bearing as tolerated    Mobility  Bed Mobility               General bed mobility comments: not assessed; pt up in chair upon arrival  Transfers Overall transfer level: Needs assistance Equipment used: Rolling walker (2 wheeled) Transfers: Sit to/from Stand Sit to Stand: Min guard         General transfer comment: cues for safe hand placement; from recliner and 3 in 1  Ambulation/Gait Ambulation/Gait assistance: Min guard Ambulation Distance (Feet): 75 Feet (10, 65) Assistive device: Rolling walker (2 wheeled) Gait Pattern/deviations: Step-to pattern;Decreased stance time - left;Decreased step length - right;Decreased step length - left;Decreased weight shift to left;Antalgic Gait velocity: decreased   General Gait Details: cues for sequencing of gait with use of AD, posture, increased WB on L LE and step length; pt with c/o lightheadedness during ambulation; 2  standing rest breaks and seated rest break   Stairs            Wheelchair Mobility    Modified Rankin (Stroke Patients Only)       Balance     Sitting balance-Leahy Scale: Good       Standing balance-Leahy Scale: Fair                      Cognition Arousal/Alertness: Awake/alert Behavior During Therapy: WFL for tasks assessed/performed Overall Cognitive Status: Within Functional Limits for tasks assessed                      Exercises Total Joint Exercises Quad Sets: AROM;Both;10 reps;Seated Heel Slides: AAROM;Left;10 reps;Seated Straight Leg Raises: AAROM;Left;5 reps;Seated Goniometric ROM: 5-63 (AAROM)    General Comments        Pertinent Vitals/Pain Pain Assessment: Faces Faces Pain Scale: Hurts little more Pain Location: L knee and L lumbar Pain Descriptors / Indicators: Grimacing;Guarding;Sore Pain Intervention(s): Limited activity within patient's tolerance;Monitored during session;Premedicated before session;Repositioned;Ice applied    Home Living                      Prior Function            PT Goals (current goals can now be found in the care plan section) Acute Rehab PT Goals Patient Stated Goal: return home after rehab at [facility] PT Goal Formulation: With patient Time For Goal Achievement: 12/31/15 Potential to Achieve Goals: Good Progress towards PT goals: Progressing toward goals    Frequency  7X/week    PT Plan Current  plan remains appropriate    Co-evaluation             End of Session Equipment Utilized During Treatment: Gait belt Activity Tolerance: Patient tolerated treatment well Patient left: with call bell/phone within reach;in chair;with nursing/sitter in room     Time: [id number] PT Time Calculation (min) (ACUTE ONLY): 37 min  Charges:  $Gait Training: 8-22 mins $Therapeutic Exercise: 8-22 mins                    G Codes:      [place] April, PTA Pager: [phone]   12/25/2015, 12:40 PM

## 2015-12-25 NOTE — Progress Notes (Addendum)
Subjective: 2 Days Post-Op Procedure(s) (LRB): TOTAL KNEE ARTHROPLASTY (Left)  Activity level:  wbat Diet tolerance:  ok Voiding:  ok Patient reports pain as mild and moderate.    Objective: Vital signs in last 24 hours: Temp:  [98.3 F (36.8 C)-99.1 F (37.3 C)] 99.1 F (37.3 C) (07/20 0407) Pulse Rate:  [81-87] 81 (07/20 0407) Resp:  [16] 16 (07/20 0407) BP: (106-150)/(81-89) 106/81 mmHg (07/20 1227) SpO2:  [96 %-98 %] 98 % (07/20 0407)  Labs: No results for input(s): HGB in the last 72 hours. No results for input(s): WBC, RBC, HCT, PLT in the last 72 hours. No results for input(s): NA, K, CL, CO2, BUN, CREATININE, GLUCOSE, CALCIUM in the last 72 hours. No results for input(s): LABPT, INR in the last 72 hours.  Physical Exam:  Neurologically intact ABD soft Neurovascular intact Sensation intact distally Intact pulses distally Dorsiflexion/Plantar flexion intact Incision: dressing C/D/I and no drainage No cellulitis present Compartment soft  Assessment/Plan:  2 Days Post-Op Procedure(s) (LRB): TOTAL KNEE ARTHROPLASTY (Left) Advance diet Up with therapy Plan for discharge tomorrow we will decide between home with home health or [facility] tomorrow depending on how she feels. I changed bandage today.. Follow up in office 2 weeks post op. Continue on ASA 325mg  BID x 2 weeks post op.  Gabriela Shepard, Larwance Sachs 12/25/2015, 12:44 PM

## 2015-12-25 NOTE — Evaluation (Signed)
Occupational Therapy Evaluation Patient Details Name: Gabriela Shepard MRN: [id number] DOB: July 27, 1960 Today's Date: 12/25/2015    History of Present Illness Pt presents for left TKA.    Clinical Impression   Pt with decline in function and safety with ADLs and ADL mobility with decreased strength, balance and endurance. Pt would benefit from acute OT services to address impairments to increase level of function and safety. Pt planning to d/c to [facility]. If pt progress enough with acute stay and will d/c home with [facility], will need equipment recommended    Follow Up Recommendations  SNF (pt may be able to d/c home with [facility] depending on progress)    Equipment Recommendations  Tub/shower seat;3 in 1 bedside comode;Other (comment) (reacher, LH sponge)    Recommendations for Other Services       Precautions / Restrictions Precautions Precautions: Knee Precaution Booklet Issued: No Precaution Comments: reviewed proper positioning Required Braces or Orthoses: Knee Immobilizer - Left Knee Immobilizer - Left: Other (comment) (until discontinued) Restrictions Weight Bearing Restrictions: Yes LLE Weight Bearing: Weight bearing as tolerated      Mobility Bed Mobility               General bed mobility comments: pt up in chair upon arrival  Transfers Overall transfer level: Needs assistance Equipment used: Rolling walker (2 wheeled) Transfers: Sit to/from Stand Sit to Stand: Min guard         General transfer comment: cues for safe hand placement; from recliner and 3 in 1    Balance     Sitting balance-Leahy Scale: Good       Standing balance-Leahy Scale: Fair                              ADL Overall ADL's : Needs assistance/impaired     Grooming: Wash/dry hands;Wash/dry face;Standing;Min guard   Upper Body Bathing: Set up;Sitting   Lower Body Bathing: Moderate assistance   Upper Body Dressing : Set up;Sitting   Lower Body Dressing:  Moderate assistance   Toilet Transfer: Min guard;Comfort height toilet;Ambulation;RW   Toileting- Clothing Manipulation and Hygiene: Minimal assistance;Sit to/from stand   Tub/ Shower Transfer: 3 in 1;Min guard;Ambulation;Rolling walker   Functional mobility during ADLs: Min guard General ADL Comments: Pt planning to d/c to SNF depending on progress, A/E and DME TBD if going home     Vision  no change from baseline              Pertinent Vitals/Pain Pain Assessment: 0-10 Pain Score: 7  Faces Pain Scale: No hurt Pain Location: L knee Pain Descriptors / Indicators: Aching;Sore Pain Intervention(s): Monitored during session;Premedicated before session;Repositioned     Hand Dominance Right   Extremity/Trunk Assessment Upper Extremity Assessment Upper Extremity Assessment: Overall WFL for tasks assessed   Lower Extremity Assessment Lower Extremity Assessment: Defer to PT evaluation   Cervical / Trunk Assessment Cervical / Trunk Assessment: Normal   Communication Communication Communication: No difficulties   Cognition Arousal/Alertness: Awake/alert Behavior During Therapy: WFL for tasks assessed/performed Overall Cognitive Status: Within Functional Limits for tasks assessed                     General Comments   pt pleasant and cooperative                Home Living Family/patient expects to be discharged to:: Skilled nursing facility Living Arrangements: Alone  Type of Home: House Home Access: Level entry     Home Layout: One level     Bathroom Shower/Tub: Tub/shower unit;Walk-in shower   Bathroom Toilet: Standard     Home Equipment: None          Prior Functioning/Environment Level of Independence: Independent             OT Diagnosis: Acute pain   OT Problem List: Decreased knowledge of use of DME or AE;Pain;Impaired balance (sitting and/or standing);Decreased activity tolerance   OT Treatment/Interventions: Self-care/ADL  training;Patient/family education;Therapeutic activities;DME and/or AE instruction    OT Goals(Current goals can be found in the care plan section) Acute Rehab OT Goals Patient Stated Goal: return home after rehab at [facility] OT Goal Formulation: With patient Time For Goal Achievement: 01/01/16 Potential to Achieve Goals: Good ADL Goals Pt Will Perform Grooming: with set-up;with supervision;standing Pt Will Perform Lower Body Bathing: with min assist;with adaptive equipment;sitting/lateral leans;sit to/from stand Pt Will Perform Lower Body Dressing: with min assist;with adaptive equipment;sit to/from stand;sitting/lateral leans Pt Will Transfer to Toilet: with supervision;ambulating (3 in 1 over toilet) Pt Will Perform Toileting - Clothing Manipulation and hygiene: with min guard assist;sit to/from stand Pt Will Perform Tub/Shower Transfer: with supervision;shower seat  OT Frequency: Min 2X/week   Barriers to D/C: Decreased caregiver support  pt lives at home alone                     End of Session Equipment Utilized During Treatment: Rolling walker;Other (comment);Left knee immobilizer (3 in 1 ) CPM Left Knee CPM Left Knee: Off  Activity Tolerance: Patient tolerated treatment well Patient left: in chair;with call bell/phone within reach   Time: [id number] OT Time Calculation (min): 28 min Charges:  OT General Charges $OT Visit: 1 Procedure OT Evaluation $OT Eval Moderate Complexity: 1 Procedure OT Treatments $Therapeutic Activity: 8-22 mins G-Codes:    Britt Bottom 12/25/2015, 1:22 PM

## 2015-12-25 NOTE — Progress Notes (Signed)
Orthopedic Tech Progress Note Patient Details:  Gabriela Shepard 09-30-1960 [id number]  Patient ID: [name], female   DOB: 05/08/1961, 55 y.o.   MRN: [id number] Pt refused cpm. Will call when ready.  [name] 12/25/2015, 5:43 AM

## 2015-12-25 NOTE — Progress Notes (Signed)
Orthopedic Tech Progress Note Patient Details:  Gabriela Shepard 1961-03-09 [id number] Ortho visit put on cpm 1840 Patient ID: [name], female   DOB: 05/26/1961, 55 y.o.   MRN: [id number]   [name] 12/25/2015, 6:39 PM

## 2015-12-25 NOTE — Clinical Social Work Note (Signed)
Clinical Social Work Assessment  Patient Details  Name: Gabriela Shepard MRN: [id number] Date of Birth: 1960/07/02  Date of referral:  12/25/15               Reason for consult:  Facility Placement                Permission sought to share information with:  Chartered certified accountant granted to share information::  Yes, Verbal Permission Granted  Name::        Agency::   ([facility])  Relationship::     Contact Information:     Housing/Transportation Living arrangements for the past 2 months:  Single Family Home Source of Information:  Patient Patient Interpreter Needed:  None Criminal Activity/Legal Involvement Pertinent to Current Situation/Hospitalization:  No - Comment as needed Significant Relationships:  Adult Children, Friend Lives with:  Self Do you feel safe going back to the place where you live?  No Need for family participation in patient care:  No (Coment)  Care giving concerns:  No caregivers present at time of assessment.   Social Worker assessment / plan:  CSW met with patient at bedside.  Patient states she has pre-registered with [facility] prior to knee surgery.  CSW confirmed this with [facility] who of note, has authorization for patient's discharge.  Patient states she will need PTAR transportation once discharged.  Employment status:    Insurance information:  Other (Comment Required) Nurse, mental health) PT Recommendations:  [place] / Referral to community resources:  [place]  Patient/Family's Response to care:  Patient is agreeable to SNF.  Patient/Family's Understanding of and Emotional Response to Diagnosis, Current Treatment, and Prognosis:  Patient offered no emotions when speaking about SNF placement.  Patient presented matter of factly and realistic regarding prognosis and recovery.  Emotional Assessment Appearance:  Appears stated age Attitude/Demeanor/Rapport:    Affect  (typically observed):  Accepting Orientation:  Oriented to Self, Oriented to Place, Oriented to  Time, Oriented to Situation Alcohol / Substance use:  Not Applicable Psych involvement (Current and /or in the community):  No (Comment)  Discharge Needs  Concerns to be addressed:  No discharge needs identified Readmission within the last 30 days:  No Current discharge risk:  None Barriers to Discharge:  Continued Medical Work up   [employer], LCSW 12/25/2015, 11:47 AM

## 2015-12-26 MED ORDER — ASPIRIN 325 MG PO TBEC: 325.0000 mg | DELAYED_RELEASE_TABLET | Freq: Two times a day (BID) | ORAL | Status: DC

## 2015-12-26 MED ORDER — METHOCARBAMOL 750 MG PO TABS: 750.0000 mg | ORAL_TABLET | Freq: Four times a day (QID) | ORAL | Status: DC | PRN

## 2015-12-26 MED ORDER — OXYCODONE-ACETAMINOPHEN 5-325 MG PO TABS: 1.0000 | ORAL_TABLET | ORAL | Status: DC | PRN

## 2015-12-26 NOTE — Progress Notes (Signed)
Orthopedic Tech Progress Note Patient Details:  Gabriela Shepard 02-26-1961 [id number]  Patient ID: Gabriela Shepard, female   DOB: Oct 10, 1960, 55 y.o.   MRN: [id number] Applied cpm 0-60  [name] 12/26/2015, 5:47 AM

## 2015-12-26 NOTE — Discharge Summary (Signed)
Patient ID: Gabriela Shepard MRN: [id number] DOB/AGE: 55-09-1960 55 y.o.  Admit date: 12/23/2015 Discharge date: 12/26/2015  Admission Diagnoses:  Principal Problem:   Primary osteoarthritis of left knee   Discharge Diagnoses:  Same  Past Medical History  Diagnosis Date  . Depression   . Urine incontinence   . History of UTI   . 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 Arthritis   . PONV (postoperative nausea and vomiting)   . Headache     Surgeries: Procedure(s): TOTAL KNEE ARTHROPLASTY on 12/23/2015   Consultants:    Discharged Condition: Improved  Hospital Course: Gabriela Shepard is an 55 y.o. female who was admitted 12/23/2015 for operative treatment ofPrimary osteoarthritis of left knee. Patient has severe unremitting pain that affects sleep, daily activities, and work/hobbies. After pre-op clearance the patient was taken to the operating room on 12/23/2015 and underwent  Procedure(s): TOTAL KNEE ARTHROPLASTY.    Patient was given perioperative antibiotics: Anti-infectives    Start     Dose/Rate Route Frequency Ordered Stop   12/23/15 1745  ceFAZolin (ANCEF) IVPB 2g/100 mL premix     2 g 200 mL/hr over 30 Minutes Intravenous Every 6 hours 12/23/15 1739 12/24/15 0544   12/23/15 1154  ceFAZolin (ANCEF) 2-4 GM/100ML-% IVPB    Comments:  Maryjean Ka   : cabinet override      12/23/15 1154 12/23/15 2359   12/23/15 1152  ceFAZolin (ANCEF) IVPB 2g/100 mL premix     2 g 200 mL/hr over 30 Minutes Intravenous On call to O.R. 12/23/15 1152 12/23/15 1255       Patient was given sequential compression devices, early ambulation, and chemoprophylaxis to prevent DVT.  Patient benefited maximally from hospital stay and there were no complications.    Recent vital signs: Patient Vitals for the past 24 hrs:  BP Temp Temp src Pulse Resp SpO2  12/26/15 0436 119/76 mmHg 98.4 F (36.9 C) Oral 83 16 95 %  12/25/15 2012 129/85 mmHg 98.1 F (36.7 C) Oral 96  18 100 %  12/25/15 1300 135/76 mmHg 97.7 F (36.5 C) Oral 82 16 100 %  12/25/15 1227 106/81 mmHg - - - - -     Recent laboratory studies: No results for input(s): WBC, HGB, HCT, PLT, NA, K, CL, CO2, BUN, CREATININE, GLUCOSE, INR, CALCIUM in the last 72 hours.  Invalid input(s): PT, 2   Discharge Medications:     Medication List    STOP taking these medications        naproxen sodium 220 MG tablet  Commonly known as:  ANAPROX      TAKE these medications        aspirin 325 MG EC tablet  Take 1 tablet (325 mg total) by mouth 2 (two) times daily after a meal.     buPROPion 300 MG 24 hr tablet  Commonly known as:  WELLBUTRIN XL  TAKE 1 TABLET BY MOUTH EVERY DAY     clonazePAM 1 MG tablet  Commonly known as:  KLONOPIN  TAKE 1 TABLET BY MOUTH 3 TIMES A DAY AS NEEDED     cyclobenzaprine 10 MG tablet  Commonly known as:  FLEXERIL  TAKE 1 TABLET BY MOUTH 3 TIMES DAILY AS NEEDED FOR MUSCLE SPASMS     FLUoxetine 40 MG capsule  Commonly known as:  PROZAC  Take 1 capsule (40 mg total) by mouth daily.     hyoscyamine 0.375 MG 12  hr tablet  Commonly known as:  LEVBID  Take 1 tablet (0.375 mg total) by mouth 2 (two) times daily.     levocetirizine 5 MG tablet  Commonly known as:  XYZAL  Take 1 tablet (5 mg total) by mouth every evening.     methocarbamol 750 MG tablet  Commonly known as:  ROBAXIN-750  Take 1 tablet (750 mg total) by mouth every 6 (six) hours as needed for muscle spasms.     MINIVELLE 0.0375 MG/24HR  Generic drug:  estradiol  APPLY 1 PATCH TWICE WEEKLY AS DIRECTED     oxyCODONE-acetaminophen 5-325 MG tablet  Commonly known as:  ROXICET  Take 1-2 tablets by mouth every 4 (four) hours as needed for severe pain.     progesterone 100 MG capsule  Commonly known as:  PROMETRIUM  Take 100 mg by mouth daily.     promethazine 25 MG tablet  Commonly known as:  PHENERGAN  Take 1 tablet (25 mg total) by mouth every 6 (six) hours as needed for nausea or vomiting.      rizatriptan 10 MG tablet  Commonly known as:  MAXALT  Take 1 tablet (10 mg total) by mouth as needed for migraine. May repeat in 2 hours if needed     valACYclovir 1000 MG tablet  Commonly known as:  VALTREX  Take 1,000 mg by mouth as needed (outbreak).        Diagnostic Studies: Dg Chest 2 View  12/11/2015  CLINICAL DATA:  Preoperative evaluation for upcoming knee replacement, initial encounter EXAM: CHEST  2 VIEW COMPARISON:  None. FINDINGS: Cardiac shadow is within normal limits. Lungs are clear bilaterally. A nodular density is noted over the left lung base likely representing a nipple shadow. Corresponding right nipple shadow is not well appreciated. No bony abnormality is seen. IMPRESSION: Small nodule over the left lung base likely representing a nipple shadow. Follow-up frontal film with nipple markers is recommended. Electronically Signed   By: Inez Catalina M.D.   On: 12/11/2015 13:04   Dg Chest Port 1 View  12/23/2015  CLINICAL DATA:  55 year old female with small left lung base nodule, possibly nipple shadow. Subsequent encounter. EXAM: PORTABLE CHEST 1 VIEW COMPARISON:  Two-view chest radiographs 12/11/2015. FINDINGS: Nipple markers replaced on this exam, although it was performed portably, AP semi upright view at 1700 hours. Subsequently, lung volumes are lower and the chest soft tissues have shifted significantly. Nipple markers project over the upper abdomen. The previously questioned left lung nodule is not definitely seen. Mediastinal contours are stable. Allowing for portable technique, the lungs are clear. IMPRESSION: 1. Low lung volumes, with otherwise no acute cardiopulmonary finding. 2. Recommend repeat PA and lateral chest radiographs with nipple markers to evaluate the possibility of the left lung nodule once the patient is fully recovered from surgery (e.g. as an outpatient). Electronically Signed   By: Genevie Ann M.D.   On: 12/23/2015 17:12    Disposition: Final  discharge disposition not confirmed      Discharge Instructions    Call MD / Call 911    Complete by:  As directed   If you experience chest pain or shortness of breath, CALL 911 and be transported to the hospital emergency room.  If you develope a fever above 101 F, pus (white drainage) or increased drainage or redness at the wound, or calf pain, call your surgeon's office.     Constipation Prevention    Complete by:  As directed  Drink plenty of fluids.  Prune juice may be helpful.  You may use a stool softener, such as Colace (over the counter) 100 mg twice a day.  Use MiraLax (over the counter) for constipation as needed.     Diet - low sodium heart healthy    Complete by:  As directed      Discharge instructions    Complete by:  As directed   INSTRUCTIONS AFTER JOINT REPLACEMENT   Remove items at home which could result in a fall. This includes throw rugs or furniture in walking pathways ICE to the affected joint every three hours while awake for 30 minutes at a time, for at least the first 3-5 days, and then as needed for pain and swelling.  Continue to use ice for pain and swelling. You may notice swelling that will progress down to the foot and ankle.  This is normal after surgery.  Elevate your leg when you are not up walking on it.   Continue to use the breathing machine you got in the hospital (incentive spirometer) which will help keep your temperature down.  It is common for your temperature to cycle up and down following surgery, especially at night when you are not up moving around and exerting yourself.  The breathing machine keeps your lungs expanded and your temperature down.   DIET:  As you were doing prior to hospitalization, we recommend a well-balanced diet.  DRESSING / WOUND CARE / SHOWERING  You may shower 3 days after surgery, but keep the wounds dry during showering.  You may use an occlusive plastic wrap (Press'n Seal for example), NO SOAKING/SUBMERGING IN THE  BATHTUB.  If the bandage gets wet, change with a clean dry gauze.  If the incision gets wet, pat the wound dry with a clean towel.  ACTIVITY  Increase activity slowly as tolerated, but follow the weight bearing instructions below.   No driving for 6 weeks or until further direction given by your physician.  You cannot drive while taking narcotics.  No lifting or carrying greater than 10 lbs. until further directed by your surgeon. Avoid periods of inactivity such as sitting longer than an hour when not asleep. This helps prevent blood clots.  You may return to work once you are authorized by your doctor.     WEIGHT BEARING   Weight bearing as tolerated with assist device (walker, cane, etc) as directed, use it as long as suggested by your surgeon or therapist, typically at least 4-6 weeks.   EXERCISES  Results after joint replacement surgery are often greatly improved when you follow the exercise, range of motion and muscle strengthening exercises prescribed by your doctor. Safety measures are also important to protect the joint from further injury. Any time any of these exercises cause you to have increased pain or swelling, decrease what you are doing until you are comfortable again and then slowly increase them. If you have problems or questions, call your caregiver or physical therapist for advice.   Rehabilitation is important following a joint replacement. After just a few days of immobilization, the muscles of the leg can become weakened and shrink (atrophy).  These exercises are designed to build up the tone and strength of the thigh and leg muscles and to improve motion. Often times heat used for twenty to thirty minutes before working out will loosen up your tissues and help with improving the range of motion but do not use heat for the first two weeks  following surgery (sometimes heat can increase post-operative swelling).   These exercises can be done on a training (exercise) mat,  on the floor, on a table or on a bed. Use whatever works the best and is most comfortable for you.    Use music or television while you are exercising so that the exercises are a pleasant break in your day. This will make your life better with the exercises acting as a break in your routine that you can look forward to.   Perform all exercises about fifteen times, three times per day or as directed.  You should exercise both the operative leg and the other leg as well.   Exercises include:   Quad Sets - Tighten up the muscle on the front of the thigh (Quad) and hold for 5-10 seconds.   Straight Leg Raises - With your knee straight (if you were given a brace, keep it on), lift the leg to 60 degrees, hold for 3 seconds, and slowly lower the leg.  Perform this exercise against resistance later as your leg gets stronger.  Leg Slides: Lying on your back, slowly slide your foot toward your buttocks, bending your knee up off the floor (only go as far as is comfortable). Then slowly slide your foot back down until your leg is flat on the floor again.  Angel Wings: Lying on your back spread your legs to the side as far apart as you can without causing discomfort.  Hamstring Strength:  Lying on your back, push your heel against the floor with your leg straight by tightening up the muscles of your buttocks.  Repeat, but this time bend your knee to a comfortable angle, and push your heel against the floor.  You may put a pillow under the heel to make it more comfortable if necessary.   A rehabilitation program following joint replacement surgery can speed recovery and prevent re-injury in the future due to weakened muscles. Contact your doctor or a physical therapist for more information on knee rehabilitation.    CONSTIPATION  Constipation is defined medically as fewer than three stools per week and severe constipation as less than one stool per week.  Even if you have a regular bowel pattern at home, your  normal regimen is likely to be disrupted due to multiple reasons following surgery.  Combination of anesthesia, postoperative narcotics, change in appetite and fluid intake all can affect your bowels.   YOU MUST use at least one of the following options; they are listed in order of increasing strength to get the job done.  They are all available over the counter, and you may need to use some, POSSIBLY even all of these options:    Drink plenty of fluids (prune juice may be helpful) and high fiber foods Colace 100 mg by mouth twice a day  Senokot for constipation as directed and as needed Dulcolax (bisacodyl), take with full glass of water  Miralax (polyethylene glycol) once or twice a day as needed.  If you have tried all these things and are unable to have a bowel movement in the first 3-4 days after surgery call either your surgeon or your primary doctor.    If you experience loose stools or diarrhea, hold the medications until you stool forms back up.  If your symptoms do not get better within 1 week or if they get worse, check with your doctor.  If you experience "the worst abdominal pain ever" or develop nausea or vomiting, please  contact the office immediately for further recommendations for treatment.   ITCHING:  If you experience itching with your medications, try taking only a single pain pill, or even half a pain pill at a time.  You can also use Benadryl over the counter for itching or also to help with sleep.   TED HOSE STOCKINGS:  Use stockings on both legs until for at least 2 weeks or as directed by physician office. They may be removed at night for sleeping.  MEDICATIONS:  See your medication summary on the "After Visit Summary" that nursing will review with you.  You may have some home medications which will be placed on hold until you complete the course of blood thinner medication.  It is important for you to complete the blood thinner medication as prescribed.  PRECAUTIONS:   If you experience chest pain or shortness of breath - call 911 immediately for transfer to the hospital emergency department.   If you develop a fever greater that 101 F, purulent drainage from wound, increased redness or drainage from wound, foul odor from the wound/dressing, or calf pain - CONTACT YOUR SURGEON.                                                   FOLLOW-UP APPOINTMENTS:  If you do not already have a post-op appointment, please call the office for an appointment to be seen by your surgeon.  Guidelines for how soon to be seen are listed in your "After Visit Summary", but are typically between 1-4 weeks after surgery.  OTHER INSTRUCTIONS:   Knee Replacement:  Do not place pillow under knee, focus on keeping the knee straight while resting. CPM instructions: 0-90 degrees, 2 hours in the morning, 2 hours in the afternoon, and 2 hours in the evening. Place foam block, curve side up under heel at all times except when in CPM or when walking.  DO NOT modify, tear, cut, or change the foam block in any way.  MAKE SURE YOU:  Understand these instructions.  Get help right away if you are not doing well or get worse.    Thank you for letting us be a part of your medical care team.  It is a privilege we respect greatly.  We hope these instructions will help you stay on track for a fast and full recovery!     Increase activity slowly as tolerated    Complete by:  As directed            Follow-up Information    Follow up with Hessie Dibble, MD. Schedule an appointment as soon as possible for a visit in 2 weeks.   Specialty:  Orthopedic Surgery   Contact information:   [place] [address] [phone]        Signed: Rich Fuchs 12/26/2015, 7:28 AM

## 2015-12-26 NOTE — Progress Notes (Signed)
Physical Therapy Treatment Patient Details Name: Gabriela Shepard MRN: [id number] DOB: Jan 29, 1961 Today's Date: 12/26/2015    History of Present Illness Pt presents for left TKA.     PT Comments    Patient continues to progress toward mobility goals. Tolerated gait/stair training well. Review HEP next session. Pt is having the most difficulty achieving L knee flexion. Due to pt's progress and available assist at home for the next week recommending HHPT for further skilled PT services to maximize independence and safety with mobility.   Follow Up Recommendations  Home health PT     Equipment Recommendations  Rolling walker with 5" wheels;3in1 (PT)    Recommendations for Other Services       Precautions / Restrictions Precautions Precautions: Knee Precaution Booklet Issued: No Precaution Comments: reviewed proper positioning Restrictions Weight Bearing Restrictions: Yes LLE Weight Bearing: Weight bearing as tolerated    Mobility  Bed Mobility               General bed mobility comments: ambulating in room with NT upon arrival  Transfers Overall transfer level: Needs assistance Equipment used: Rolling walker (2 wheeled) Transfers: Sit to/from Stand Sit to Stand: Supervision         General transfer comment: supervision for safety; cues for hand placement and technique when descending to recliner; from [facility] and recliner  Ambulation/Gait Ambulation/Gait assistance: Supervision Ambulation Distance (Feet): 180 Feet Assistive device: Rolling walker (2 wheeled) Gait Pattern/deviations: Step-to pattern;Decreased step length - right;Decreased stance time - left;Decreased weight shift to left;Antalgic;Trunk flexed Gait velocity: decreased   General Gait Details: cues for increased knee flexion during swing phase, sequencing, and posture; encouraged pt to increase WB on L LE and working on step length symmetry; pt with tendency to maintain L knee extension     Stairs Stairs: Yes Stairs assistance: Min assist Stair Management: One rail Right;Forwards Number of Stairs: 2 General stair comments: educated on sequencing and technique; HHA L side and R rail to ascend and no HHA to descend  Wheelchair Mobility    Modified Rankin (Stroke Patients Only)       Balance     Sitting balance-Leahy Scale: Good       Standing balance-Leahy Scale: Fair                      Cognition Arousal/Alertness: Awake/alert Behavior During Therapy: WFL for tasks assessed/performed Overall Cognitive Status: Within Functional Limits for tasks assessed                      Exercises      General Comments        Pertinent Vitals/Pain Pain Assessment: 0-10 Pain Score: 7  Pain Location: L knee and back Pain Descriptors / Indicators: Aching;Guarding;Sore Pain Intervention(s): Limited activity within patient's tolerance;Monitored during session;Premedicated before session;Repositioned;Ice applied    Home Living                      Prior Function            PT Goals (current goals can now be found in the care plan section) Acute Rehab PT Goals Patient Stated Goal: get back to being indepedent PT Goal Formulation: With patient Time For Goal Achievement: 12/31/15 Potential to Achieve Goals: Good Progress towards PT goals: Progressing toward goals    Frequency  7X/week    PT Plan Discharge plan needs to be updated    Co-evaluation  End of Session Equipment Utilized During Treatment: Gait belt Activity Tolerance: Patient tolerated treatment well Patient left: with call bell/phone within reach;in chair     Time: [id number] PT Time Calculation (min) (ACUTE ONLY): 27 min  Charges:  $Gait Training: 8-22 mins $Therapeutic Activity: 8-22 mins                    G Codes:      Salina April, PTA Pager: [phone]   12/26/2015, 10:21 AM

## 2015-12-26 NOTE — Progress Notes (Signed)
Physical Therapy Treatment Patient Details Name: Gabriela Shepard MRN: [id number] DOB: 11-25-1960 Today's Date: 12/26/2015    History of Present Illness Pt presents for left TKA.     PT Comments    Reviewed HEP and pt led through therex with min A needed. Pt continues to have difficulty with knee flexion and increasing WB on L LE. Encouraged pt to work on HEP 3 X day and perform knee flexion exercise every hour she has rested in extension. Current plan remains appropriate.   Follow Up Recommendations  Home health PT     Equipment Recommendations  Rolling walker with 5" wheels;3in1 (PT)    Recommendations for Other Services       Precautions / Restrictions Precautions Precautions: Knee Precaution Booklet Issued: No Precaution Comments: reviewed proper positioning Restrictions Weight Bearing Restrictions: Yes LLE Weight Bearing: Weight bearing as tolerated    Mobility  Bed Mobility Overal bed mobility: Needs Assistance Bed Mobility: Supine to Sit;Sit to Supine     Supine to sit: Min guard Sit to supine: Min guard   General bed mobility comments: cues for sequencing and technique using sheet to elevate L LE into bed  Transfers Overall transfer level: Needs assistance Equipment used: Rolling walker (2 wheeled) Transfers: Sit to/from Stand Sit to Stand: Supervision         General transfer comment: cues for hand placement  Ambulation/Gait Ambulation/Gait assistance: Supervision Ambulation Distance (Feet): 22 Feet Assistive device: Rolling walker (2 wheeled) Gait Pattern/deviations: Step-to pattern;Decreased stance time - left;Decreased step length - right;Decreased weight shift to left;Antalgic Gait velocity: decreased   General Gait Details: cues for increased WB on L LE and L knee flexion during swing phase; pt with decreased WB on L LE compared to previous session   Stairs Stairs: Yes Stairs assistance: Min assist Stair Management: One rail  Right;Forwards Number of Stairs: 2 General stair comments: educated on sequencing and technique; HHA L side and R rail to ascend and no HHA to descend  Wheelchair Mobility    Modified Rankin (Stroke Patients Only)       Balance     Sitting balance-Leahy Scale: Good       Standing balance-Leahy Scale: Fair                      Cognition Arousal/Alertness: Awake/alert Behavior During Therapy: WFL for tasks assessed/performed Overall Cognitive Status: Within Functional Limits for tasks assessed                      Exercises Total Joint Exercises Quad Sets: AROM;Left;10 reps;Supine Short Arc Quad: AAROM;Left;10 reps;Supine Heel Slides: AAROM;Left;10 reps;Supine Hip ABduction/ADduction: AAROM;Left;10 reps;Supine Straight Leg Raises: AAROM;Left;5 reps;Supine Knee Flexion: AROM;Left;Seated;Other (comment) (3) Goniometric ROM: 5-60    General Comments        Pertinent Vitals/Pain Pain Assessment: Faces Pain Score: 7  Faces Pain Scale: Hurts even more Pain Location: L knee with therex Pain Descriptors / Indicators: Aching;Grimacing;Guarding;Sore Pain Intervention(s): Limited activity within patient's tolerance;Monitored during session;Premedicated before session;Repositioned    Home Living                      Prior Function            PT Goals (current goals can now be found in the care plan section) Acute Rehab PT Goals Patient Stated Goal: get back to being indepedent PT Goal Formulation: With patient Time For Goal Achievement: 12/31/15 Potential to Achieve Goals:  Good Progress towards PT goals: Progressing toward goals    Frequency  7X/week    PT Plan Discharge plan needs to be updated    Co-evaluation             End of Session Equipment Utilized During Treatment: Gait belt Activity Tolerance: Patient limited by pain Patient left: with call bell/phone within reach;in bed;with nursing/sitter in room     Time:  [id number] PT Time Calculation (min) (ACUTE ONLY): 28 min  Charges:  $Gait Training: 8-22 mins $Therapeutic Exercise: 8-22 mins                    G Codes:      Salina April, PTA Pager: [phone]   12/26/2015, 11:48 AM

## 2015-12-26 NOTE — Progress Notes (Signed)
Pt discharged from unit with family to pvt auto to home. No distress noted from patient, no c/o. All personal belongings with pt. Discharge instructions and rx reviewed with pt.

## 2015-12-27 DIAGNOSIS — Z8659 Personal history of other mental and behavioral disorders: Secondary | ICD-10-CM | POA: Diagnosis not present

## 2015-12-27 DIAGNOSIS — Z79891 Long term (current) use of opiate analgesic: Secondary | ICD-10-CM | POA: Diagnosis not present

## 2015-12-27 DIAGNOSIS — M6281 Muscle weakness (generalized): Secondary | ICD-10-CM | POA: Diagnosis not present

## 2015-12-27 DIAGNOSIS — Z471 Aftercare following joint replacement surgery: Secondary | ICD-10-CM | POA: Diagnosis not present

## 2015-12-27 DIAGNOSIS — Z96652 Presence of left artificial knee joint: Secondary | ICD-10-CM | POA: Diagnosis not present

## 2015-12-27 DIAGNOSIS — Z9181 History of falling: Secondary | ICD-10-CM | POA: Diagnosis not present

## 2015-12-27 DIAGNOSIS — F329 Major depressive disorder, single episode, unspecified: Secondary | ICD-10-CM | POA: Diagnosis not present

## 2015-12-27 DIAGNOSIS — Z87440 Personal history of urinary (tract) infections: Secondary | ICD-10-CM | POA: Diagnosis not present

## 2015-12-27 DIAGNOSIS — G43909 Migraine, unspecified, not intractable, without status migrainosus: Secondary | ICD-10-CM | POA: Diagnosis not present

## 2015-12-27 DIAGNOSIS — Z7982 Long term (current) use of aspirin: Secondary | ICD-10-CM | POA: Diagnosis not present

## 2015-12-29 DIAGNOSIS — Z87440 Personal history of urinary (tract) infections: Secondary | ICD-10-CM | POA: Diagnosis not present

## 2015-12-29 DIAGNOSIS — Z96652 Presence of left artificial knee joint: Secondary | ICD-10-CM | POA: Diagnosis not present

## 2015-12-29 DIAGNOSIS — F329 Major depressive disorder, single episode, unspecified: Secondary | ICD-10-CM | POA: Diagnosis not present

## 2015-12-29 DIAGNOSIS — G43909 Migraine, unspecified, not intractable, without status migrainosus: Secondary | ICD-10-CM | POA: Diagnosis not present

## 2015-12-29 DIAGNOSIS — Z9181 History of falling: Secondary | ICD-10-CM | POA: Diagnosis not present

## 2015-12-29 DIAGNOSIS — M6281 Muscle weakness (generalized): Secondary | ICD-10-CM | POA: Diagnosis not present

## 2015-12-29 DIAGNOSIS — Z8659 Personal history of other mental and behavioral disorders: Secondary | ICD-10-CM | POA: Diagnosis not present

## 2015-12-29 DIAGNOSIS — Z7982 Long term (current) use of aspirin: Secondary | ICD-10-CM | POA: Diagnosis not present

## 2015-12-29 DIAGNOSIS — Z79891 Long term (current) use of opiate analgesic: Secondary | ICD-10-CM | POA: Diagnosis not present

## 2015-12-29 DIAGNOSIS — Z471 Aftercare following joint replacement surgery: Secondary | ICD-10-CM | POA: Diagnosis not present

## 2015-12-30 DIAGNOSIS — G43909 Migraine, unspecified, not intractable, without status migrainosus: Secondary | ICD-10-CM | POA: Diagnosis not present

## 2015-12-30 DIAGNOSIS — F329 Major depressive disorder, single episode, unspecified: Secondary | ICD-10-CM | POA: Diagnosis not present

## 2015-12-30 DIAGNOSIS — Z471 Aftercare following joint replacement surgery: Secondary | ICD-10-CM | POA: Diagnosis not present

## 2015-12-30 DIAGNOSIS — Z96652 Presence of left artificial knee joint: Secondary | ICD-10-CM | POA: Diagnosis not present

## 2015-12-30 DIAGNOSIS — Z8659 Personal history of other mental and behavioral disorders: Secondary | ICD-10-CM | POA: Diagnosis not present

## 2015-12-30 DIAGNOSIS — Z79891 Long term (current) use of opiate analgesic: Secondary | ICD-10-CM | POA: Diagnosis not present

## 2015-12-30 DIAGNOSIS — Z7982 Long term (current) use of aspirin: Secondary | ICD-10-CM | POA: Diagnosis not present

## 2015-12-30 DIAGNOSIS — Z87440 Personal history of urinary (tract) infections: Secondary | ICD-10-CM | POA: Diagnosis not present

## 2015-12-30 DIAGNOSIS — Z9181 History of falling: Secondary | ICD-10-CM | POA: Diagnosis not present

## 2015-12-30 DIAGNOSIS — M6281 Muscle weakness (generalized): Secondary | ICD-10-CM | POA: Diagnosis not present

## 2016-01-01 DIAGNOSIS — M6281 Muscle weakness (generalized): Secondary | ICD-10-CM | POA: Diagnosis not present

## 2016-01-01 DIAGNOSIS — Z471 Aftercare following joint replacement surgery: Secondary | ICD-10-CM | POA: Diagnosis not present

## 2016-01-01 DIAGNOSIS — Z96652 Presence of left artificial knee joint: Secondary | ICD-10-CM | POA: Diagnosis not present

## 2016-01-01 DIAGNOSIS — Z87440 Personal history of urinary (tract) infections: Secondary | ICD-10-CM | POA: Diagnosis not present

## 2016-01-01 DIAGNOSIS — Z9181 History of falling: Secondary | ICD-10-CM | POA: Diagnosis not present

## 2016-01-01 DIAGNOSIS — G43909 Migraine, unspecified, not intractable, without status migrainosus: Secondary | ICD-10-CM | POA: Diagnosis not present

## 2016-01-01 DIAGNOSIS — Z8659 Personal history of other mental and behavioral disorders: Secondary | ICD-10-CM | POA: Diagnosis not present

## 2016-01-01 DIAGNOSIS — Z79891 Long term (current) use of opiate analgesic: Secondary | ICD-10-CM | POA: Diagnosis not present

## 2016-01-01 DIAGNOSIS — Z7982 Long term (current) use of aspirin: Secondary | ICD-10-CM | POA: Diagnosis not present

## 2016-01-01 DIAGNOSIS — F329 Major depressive disorder, single episode, unspecified: Secondary | ICD-10-CM | POA: Diagnosis not present

## 2016-01-02 DIAGNOSIS — M1712 Unilateral primary osteoarthritis, left knee: Secondary | ICD-10-CM | POA: Diagnosis not present

## 2016-01-06 DIAGNOSIS — M6281 Muscle weakness (generalized): Secondary | ICD-10-CM | POA: Diagnosis not present

## 2016-01-06 DIAGNOSIS — Z9181 History of falling: Secondary | ICD-10-CM | POA: Diagnosis not present

## 2016-01-06 DIAGNOSIS — Z7982 Long term (current) use of aspirin: Secondary | ICD-10-CM | POA: Diagnosis not present

## 2016-01-06 DIAGNOSIS — G43909 Migraine, unspecified, not intractable, without status migrainosus: Secondary | ICD-10-CM | POA: Diagnosis not present

## 2016-01-06 DIAGNOSIS — Z471 Aftercare following joint replacement surgery: Secondary | ICD-10-CM | POA: Diagnosis not present

## 2016-01-06 DIAGNOSIS — Z79891 Long term (current) use of opiate analgesic: Secondary | ICD-10-CM | POA: Diagnosis not present

## 2016-01-06 DIAGNOSIS — Z87440 Personal history of urinary (tract) infections: Secondary | ICD-10-CM | POA: Diagnosis not present

## 2016-01-06 DIAGNOSIS — Z96652 Presence of left artificial knee joint: Secondary | ICD-10-CM | POA: Diagnosis not present

## 2016-01-06 DIAGNOSIS — Z8659 Personal history of other mental and behavioral disorders: Secondary | ICD-10-CM | POA: Diagnosis not present

## 2016-01-06 DIAGNOSIS — F329 Major depressive disorder, single episode, unspecified: Secondary | ICD-10-CM | POA: Diagnosis not present

## 2016-01-07 DIAGNOSIS — M25562 Pain in left knee: Secondary | ICD-10-CM | POA: Diagnosis not present

## 2016-01-07 DIAGNOSIS — M25662 Stiffness of left knee, not elsewhere classified: Secondary | ICD-10-CM | POA: Diagnosis not present

## 2016-01-07 DIAGNOSIS — Z96652 Presence of left artificial knee joint: Secondary | ICD-10-CM | POA: Diagnosis not present

## 2016-01-12 DIAGNOSIS — M25562 Pain in left knee: Secondary | ICD-10-CM | POA: Diagnosis not present

## 2016-01-12 DIAGNOSIS — Z96652 Presence of left artificial knee joint: Secondary | ICD-10-CM | POA: Diagnosis not present

## 2016-01-12 DIAGNOSIS — M25662 Stiffness of left knee, not elsewhere classified: Secondary | ICD-10-CM | POA: Diagnosis not present

## 2016-01-15 DIAGNOSIS — M25562 Pain in left knee: Secondary | ICD-10-CM | POA: Diagnosis not present

## 2016-01-15 DIAGNOSIS — M25662 Stiffness of left knee, not elsewhere classified: Secondary | ICD-10-CM | POA: Diagnosis not present

## 2016-01-15 DIAGNOSIS — Z96652 Presence of left artificial knee joint: Secondary | ICD-10-CM | POA: Diagnosis not present

## 2016-01-19 DIAGNOSIS — Z96652 Presence of left artificial knee joint: Secondary | ICD-10-CM | POA: Diagnosis not present

## 2016-01-19 DIAGNOSIS — M25562 Pain in left knee: Secondary | ICD-10-CM | POA: Diagnosis not present

## 2016-01-19 DIAGNOSIS — M25662 Stiffness of left knee, not elsewhere classified: Secondary | ICD-10-CM | POA: Diagnosis not present

## 2016-01-22 DIAGNOSIS — M25662 Stiffness of left knee, not elsewhere classified: Secondary | ICD-10-CM | POA: Diagnosis not present

## 2016-01-22 DIAGNOSIS — M25562 Pain in left knee: Secondary | ICD-10-CM | POA: Diagnosis not present

## 2016-01-22 DIAGNOSIS — Z96652 Presence of left artificial knee joint: Secondary | ICD-10-CM | POA: Diagnosis not present

## 2016-01-23 ENCOUNTER — Other Ambulatory Visit: Payer: Self-pay | Admitting: Family Medicine

## 2016-01-26 DIAGNOSIS — M25562 Pain in left knee: Secondary | ICD-10-CM | POA: Diagnosis not present

## 2016-01-26 DIAGNOSIS — M25662 Stiffness of left knee, not elsewhere classified: Secondary | ICD-10-CM | POA: Diagnosis not present

## 2016-01-26 DIAGNOSIS — Z96652 Presence of left artificial knee joint: Secondary | ICD-10-CM | POA: Diagnosis not present

## 2016-01-28 DIAGNOSIS — M25662 Stiffness of left knee, not elsewhere classified: Secondary | ICD-10-CM | POA: Diagnosis not present

## 2016-01-28 DIAGNOSIS — M25562 Pain in left knee: Secondary | ICD-10-CM | POA: Diagnosis not present

## 2016-01-28 DIAGNOSIS — Z96652 Presence of left artificial knee joint: Secondary | ICD-10-CM | POA: Diagnosis not present

## 2016-02-04 DIAGNOSIS — Z96652 Presence of left artificial knee joint: Secondary | ICD-10-CM | POA: Diagnosis not present

## 2016-02-04 DIAGNOSIS — M545 Low back pain: Secondary | ICD-10-CM | POA: Diagnosis not present

## 2016-02-04 DIAGNOSIS — M25662 Stiffness of left knee, not elsewhere classified: Secondary | ICD-10-CM | POA: Diagnosis not present

## 2016-02-04 DIAGNOSIS — M25562 Pain in left knee: Secondary | ICD-10-CM | POA: Diagnosis not present

## 2016-02-10 DIAGNOSIS — M25662 Stiffness of left knee, not elsewhere classified: Secondary | ICD-10-CM | POA: Diagnosis not present

## 2016-02-10 DIAGNOSIS — Z96652 Presence of left artificial knee joint: Secondary | ICD-10-CM | POA: Diagnosis not present

## 2016-02-10 DIAGNOSIS — M545 Low back pain: Secondary | ICD-10-CM | POA: Diagnosis not present

## 2016-02-10 DIAGNOSIS — M25562 Pain in left knee: Secondary | ICD-10-CM | POA: Diagnosis not present

## 2016-02-11 ENCOUNTER — Other Ambulatory Visit: Payer: Self-pay | Admitting: Obstetrics & Gynecology

## 2016-02-11 DIAGNOSIS — N63 Unspecified lump in unspecified breast: Secondary | ICD-10-CM

## 2016-02-12 DIAGNOSIS — M545 Low back pain: Secondary | ICD-10-CM | POA: Diagnosis not present

## 2016-02-12 DIAGNOSIS — Z96652 Presence of left artificial knee joint: Secondary | ICD-10-CM | POA: Diagnosis not present

## 2016-02-12 DIAGNOSIS — M25562 Pain in left knee: Secondary | ICD-10-CM | POA: Diagnosis not present

## 2016-02-12 DIAGNOSIS — M25662 Stiffness of left knee, not elsewhere classified: Secondary | ICD-10-CM | POA: Diagnosis not present

## 2016-02-13 ENCOUNTER — Ambulatory Visit (INDEPENDENT_AMBULATORY_CARE_PROVIDER_SITE_OTHER): Payer: BLUE CROSS/BLUE SHIELD | Admitting: Family Medicine

## 2016-02-13 ENCOUNTER — Encounter: Payer: Self-pay | Admitting: Family Medicine

## 2016-02-13 VITALS — BP 100/80 | HR 85 | Temp 98.2°F | Ht 64.5 in | Wt 150.0 lb

## 2016-02-13 DIAGNOSIS — R3915 Urgency of urination: Secondary | ICD-10-CM | POA: Diagnosis not present

## 2016-02-13 DIAGNOSIS — Z780 Asymptomatic menopausal state: Secondary | ICD-10-CM | POA: Diagnosis not present

## 2016-02-13 DIAGNOSIS — L710 Perioral dermatitis: Secondary | ICD-10-CM | POA: Diagnosis not present

## 2016-02-13 LAB — POCT URINALYSIS DIPSTICK
BILIRUBIN UA: NEGATIVE
Blood, UA: NEGATIVE
Glucose, UA: NEGATIVE
KETONES UA: NEGATIVE
LEUKOCYTES UA: NEGATIVE
NITRITE UA: NEGATIVE
Protein, UA: NEGATIVE
SPEC GRAV UA: 1.02
Urobilinogen, UA: 0.2
pH, UA: 6

## 2016-02-13 MED ORDER — DOXYCYCLINE HYCLATE 100 MG PO CAPS: ORAL_CAPSULE | ORAL | 3 refills | Status: DC

## 2016-02-13 NOTE — Progress Notes (Signed)
Pre visit review using our clinic review tool, if applicable. No additional management support is needed unless otherwise documented below in the visit note. 

## 2016-02-13 NOTE — Progress Notes (Signed)
Subjective:     Patient ID: Gabriela Shepard, female   DOB: Apr 26, 1961, 55 y.o.   MRN: [id number]  HPI Patient here with several items as follows. She had recent left total knee replacement which went well  Urine urgency. No burning with urination. Symptoms ever since her surgery. Usually gets up once or twice at night to urinate. She has minimized caffeine use. No alcohol use. Denies any fevers or chills. No hematuria. No slow stream. No obstructive symptoms.  Recurrent skin rash mostly above her lip-upper lip area. Had seen dermatologist and treated with doxycycline. Does seem to respond favorably to doxycycline and rash is intermittent. No clear provoking factors. No other alleviating factors  Family history of osteoporosis in her mother. Patient requesting screening. No prior screening. She has no other specific risk factors other than postmenopausal and family history  Past Medical History:  Diagnosis Date  . Arthritis   . Depression   . Headache   . 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 History of UTI   . PONV (postoperative nausea and vomiting)   . Urine incontinence    Past Surgical History:  Procedure Laterality Date  . CESAREAN SECTION  1992  . INCONTINENCE SURGERY    . LAPAROSCOPY     under bladder  . TOTAL KNEE ARTHROPLASTY Left 12/23/2015   Procedure: TOTAL KNEE ARTHROPLASTY;  Surgeon: Melrose Nakayama, MD;  Location: [facility];  Service: Orthopedics;  Laterality: Left;    reports that she quit smoking about 5 years ago. Her smoking use included Cigarettes. She has a 5.00 pack-year smoking history. She has never used smokeless tobacco. She reports that she does not drink alcohol or use drugs. family history includes Alcohol abuse in her other; Arthritis in her other; Hypertension in her mother and other; Mental illness in her other. Allergies  Allergen Reactions  . Sulfa Antibiotics Hives     Review of Systems  Constitutional: Negative for fatigue.   Eyes: Negative for visual disturbance.  Respiratory: Negative for cough, chest tightness, shortness of breath and wheezing.   Cardiovascular: Negative for chest pain, palpitations and leg swelling.  Skin: Positive for rash.  Neurological: Negative for dizziness, seizures, syncope, weakness, light-headedness and headaches.       Objective:   Physical Exam  Constitutional: She appears well-developed and well-nourished.  Eyes: Pupils are equal, round, and reactive to light.  Neck: Neck supple. No JVD present. No thyromegaly present.  Cardiovascular: Normal rate and regular rhythm.  Exam reveals no gallop.   Pulmonary/Chest: Effort normal and breath sounds normal. No respiratory distress. She has no wheezes. She has no rales.  Musculoskeletal: She exhibits no edema.  Neurological: She is alert.  Skin: Rash noted.  She has some erythematous papules on her face region just above her upper lip bilaterally. No pustules.       Assessment:     #1 urine urgency. 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 We discussed possible medication options but at this point she wishes to wait.  Eulas Post MD [facility] at [facility]

## 2016-02-13 NOTE — Patient Instructions (Signed)
Stay well hydrated Minimize caffeine use

## 2016-02-17 ENCOUNTER — Ambulatory Visit
Admission: RE | Admit: 2016-02-17 | Discharge: 2016-02-17 | Disposition: A | Discharge status: Home / Self Care | Payer: BLUE CROSS/BLUE SHIELD | Source: Ambulatory Visit | Attending: Obstetrics & Gynecology | Admitting: Obstetrics & Gynecology

## 2016-02-17 DIAGNOSIS — R922 Inconclusive mammogram: Secondary | ICD-10-CM | POA: Diagnosis not present

## 2016-02-17 DIAGNOSIS — M25562 Pain in left knee: Secondary | ICD-10-CM | POA: Diagnosis not present

## 2016-02-17 DIAGNOSIS — Z96652 Presence of left artificial knee joint: Secondary | ICD-10-CM | POA: Diagnosis not present

## 2016-02-17 DIAGNOSIS — M545 Low back pain: Secondary | ICD-10-CM | POA: Diagnosis not present

## 2016-02-17 DIAGNOSIS — N63 Unspecified lump in unspecified breast: Secondary | ICD-10-CM

## 2016-02-17 DIAGNOSIS — M25662 Stiffness of left knee, not elsewhere classified: Secondary | ICD-10-CM | POA: Diagnosis not present

## 2016-02-18 ENCOUNTER — Ambulatory Visit (INDEPENDENT_AMBULATORY_CARE_PROVIDER_SITE_OTHER)
Admission: RE | Admit: 2016-02-18 | Discharge: 2016-02-18 | Disposition: A | Discharge status: Home / Self Care | Payer: BLUE CROSS/BLUE SHIELD | Source: Ambulatory Visit

## 2016-02-18 DIAGNOSIS — Z780 Asymptomatic menopausal state: Secondary | ICD-10-CM | POA: Diagnosis not present

## 2016-02-23 DIAGNOSIS — M25562 Pain in left knee: Secondary | ICD-10-CM | POA: Diagnosis not present

## 2016-02-26 ENCOUNTER — Other Ambulatory Visit: Payer: Self-pay | Admitting: Family Medicine

## 2016-02-26 DIAGNOSIS — M545 Low back pain: Secondary | ICD-10-CM | POA: Diagnosis not present

## 2016-02-26 DIAGNOSIS — M25562 Pain in left knee: Secondary | ICD-10-CM | POA: Diagnosis not present

## 2016-02-26 DIAGNOSIS — Z96652 Presence of left artificial knee joint: Secondary | ICD-10-CM | POA: Diagnosis not present

## 2016-02-26 DIAGNOSIS — M25662 Stiffness of left knee, not elsewhere classified: Secondary | ICD-10-CM | POA: Diagnosis not present

## 2016-02-27 NOTE — Telephone Encounter (Signed)
Last OV 11/26/2015 Last refill #90, 0rf 11/28/2015 Please advise

## 2016-02-29 NOTE — Telephone Encounter (Signed)
Refill once 

## 2016-03-01 ENCOUNTER — Other Ambulatory Visit: Payer: Self-pay

## 2016-03-01 DIAGNOSIS — Z96652 Presence of left artificial knee joint: Secondary | ICD-10-CM | POA: Diagnosis not present

## 2016-03-01 DIAGNOSIS — M25662 Stiffness of left knee, not elsewhere classified: Secondary | ICD-10-CM | POA: Diagnosis not present

## 2016-03-01 DIAGNOSIS — M545 Low back pain: Secondary | ICD-10-CM | POA: Diagnosis not present

## 2016-03-01 DIAGNOSIS — M25562 Pain in left knee: Secondary | ICD-10-CM | POA: Diagnosis not present

## 2016-03-01 NOTE — Telephone Encounter (Signed)
Rx was called into 90 tablet with 0 Refill, Patient needs an OV for further refills.

## 2016-03-03 DIAGNOSIS — M25562 Pain in left knee: Secondary | ICD-10-CM | POA: Diagnosis not present

## 2016-03-03 DIAGNOSIS — Z96652 Presence of left artificial knee joint: Secondary | ICD-10-CM | POA: Diagnosis not present

## 2016-03-03 DIAGNOSIS — M545 Low back pain: Secondary | ICD-10-CM | POA: Diagnosis not present

## 2016-03-03 DIAGNOSIS — M25662 Stiffness of left knee, not elsewhere classified: Secondary | ICD-10-CM | POA: Diagnosis not present

## 2016-03-09 ENCOUNTER — Ambulatory Visit (INDEPENDENT_AMBULATORY_CARE_PROVIDER_SITE_OTHER): Payer: BLUE CROSS/BLUE SHIELD | Admitting: Family Medicine

## 2016-03-09 VITALS — BP 90/60 | HR 87 | Temp 97.8°F | Ht 64.5 in | Wt 149.0 lb

## 2016-03-09 DIAGNOSIS — Z23 Encounter for immunization: Secondary | ICD-10-CM

## 2016-03-09 DIAGNOSIS — M8580 Other specified disorders of bone density and structure, unspecified site: Secondary | ICD-10-CM

## 2016-03-09 DIAGNOSIS — Z79899 Other long term (current) drug therapy: Secondary | ICD-10-CM | POA: Diagnosis not present

## 2016-03-09 DIAGNOSIS — Z049 Encounter for examination and observation for unspecified reason: Secondary | ICD-10-CM | POA: Diagnosis not present

## 2016-03-09 DIAGNOSIS — G43719 Chronic migraine without aura, intractable, without status migrainosus: Secondary | ICD-10-CM | POA: Diagnosis not present

## 2016-03-09 DIAGNOSIS — R51 Headache: Secondary | ICD-10-CM | POA: Diagnosis not present

## 2016-03-09 NOTE — Patient Instructions (Signed)
Continue with daily Calcium 1,200 mg and Vit D 1,000 IU Weight bearing exercise several times per week. We should repeat DEXA in two years.

## 2016-03-09 NOTE — Addendum Note (Signed)
Addended by: Elio Forget on: 03/09/2016 04:05 PM   Modules accepted: Orders

## 2016-03-09 NOTE — Progress Notes (Signed)
Subjective:     Patient ID: Gabriela Shepard, female   DOB: July 06, 1960, 55 y.o.   MRN: [id number]  HPI Patient seen to discuss recent DEXA scan results. This revealed osteopenia with -1.5 at the hip level. She had recent left total knee replacement has been less active over the past year. She hopes to start more frequent weightbearing soon.  She sees gynecologist and is on estrogen patch. She has never had any recent fractures. No prednisone use. Family history of osteoporosis and mother. Patient smoked previously but has been quit now for several years. She's been off alcohol for about 6 years. Previous history of abuse. Just recently started taking calcium and vitamin D regularly  Past Medical History:  Diagnosis Date  . Arthritis   . Depression   . Headache   . 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 History of UTI   . PONV (postoperative nausea and vomiting)   . Urine incontinence    Past Surgical History:  Procedure Laterality Date  . CESAREAN SECTION  1992  . INCONTINENCE SURGERY    . LAPAROSCOPY     under bladder  . TOTAL KNEE ARTHROPLASTY Left 12/23/2015   Procedure: TOTAL KNEE ARTHROPLASTY;  Surgeon: Melrose Nakayama, MD;  Location: [facility];  Service: Orthopedics;  Laterality: Left;    reports that she quit smoking about 5 years ago. Her smoking use included Cigarettes. She has a 5.00 pack-year smoking history. She has never used smokeless tobacco. She reports that she does not drink alcohol or use drugs. family history includes Alcohol abuse in her other; Arthritis in her other; Hypertension in her mother and other; Mental illness in her other. Allergies  Allergen Reactions  . Sulfa Antibiotics Hives     Review of Systems  Constitutional: Negative for fatigue.  Eyes: Negative for visual disturbance.  Respiratory: Negative for cough, chest tightness, shortness of breath and wheezing.   Cardiovascular: Negative for chest pain, palpitations and leg  swelling.  Endocrine: Negative for polydipsia and polyuria.  Neurological: Negative for dizziness, seizures, syncope, weakness, light-headedness and headaches.       Objective:   Physical Exam  Constitutional: She appears well-developed and well-nourished. No distress.  Neck: Neck supple. No thyromegaly present.  Cardiovascular: Normal rate and regular rhythm.   Pulmonary/Chest: Effort normal and breath sounds normal. No respiratory distress. She has no wheezes. She has no rales.  Musculoskeletal: She exhibits no edema.       Assessment:     Osteopenia. Her FRAX score is 23% 10 year risk for major [place] osteoporotic fracture and 1.1% for risk of hip fracture. This is above threshold of 20%.  Risk factors include that she is Caucasian, history of cigarette use, history of alcohol abuse, family history of osteoporosis    Plan:     -Increase weightbearing exercise -Continue daily calcium and vitamin D -Consider repeat DEXA scan in 2 years -Continue estrogen patch -We discussed other possible therapies including bisphosphonates but at this point she is not interested  Eulas Post MD [facility] at [facility]

## 2016-03-09 NOTE — Progress Notes (Signed)
Pre visit review using our clinic review tool, if applicable. No additional management support is needed unless otherwise documented below in the visit note. 

## 2016-03-10 ENCOUNTER — Other Ambulatory Visit: Payer: Self-pay

## 2016-03-10 DIAGNOSIS — G518 Other disorders of facial nerve: Secondary | ICD-10-CM | POA: Diagnosis not present

## 2016-03-10 DIAGNOSIS — R51 Headache: Secondary | ICD-10-CM | POA: Diagnosis not present

## 2016-03-10 DIAGNOSIS — M791 Myalgia: Secondary | ICD-10-CM | POA: Diagnosis not present

## 2016-03-10 DIAGNOSIS — M542 Cervicalgia: Secondary | ICD-10-CM | POA: Diagnosis not present

## 2016-03-10 DIAGNOSIS — G43719 Chronic migraine without aura, intractable, without status migrainosus: Secondary | ICD-10-CM | POA: Diagnosis not present

## 2016-03-10 MED ORDER — DOXYCYCLINE HYCLATE 100 MG PO CAPS: ORAL_CAPSULE | ORAL | 3 refills | Status: DC

## 2016-03-17 DIAGNOSIS — M9901 Segmental and somatic dysfunction of cervical region: Secondary | ICD-10-CM | POA: Diagnosis not present

## 2016-03-17 DIAGNOSIS — M50322 Other cervical disc degeneration at C5-C6 level: Secondary | ICD-10-CM | POA: Diagnosis not present

## 2016-03-17 DIAGNOSIS — Q72811 Congenital shortening of right lower limb: Secondary | ICD-10-CM | POA: Diagnosis not present

## 2016-03-17 DIAGNOSIS — R51 Headache: Secondary | ICD-10-CM | POA: Diagnosis not present

## 2016-03-17 DIAGNOSIS — M9905 Segmental and somatic dysfunction of pelvic region: Secondary | ICD-10-CM | POA: Diagnosis not present

## 2016-03-18 DIAGNOSIS — M50322 Other cervical disc degeneration at C5-C6 level: Secondary | ICD-10-CM | POA: Diagnosis not present

## 2016-03-18 DIAGNOSIS — M9901 Segmental and somatic dysfunction of cervical region: Secondary | ICD-10-CM | POA: Diagnosis not present

## 2016-03-18 DIAGNOSIS — R51 Headache: Secondary | ICD-10-CM | POA: Diagnosis not present

## 2016-03-18 DIAGNOSIS — M9905 Segmental and somatic dysfunction of pelvic region: Secondary | ICD-10-CM | POA: Diagnosis not present

## 2016-03-18 DIAGNOSIS — Q72811 Congenital shortening of right lower limb: Secondary | ICD-10-CM | POA: Diagnosis not present

## 2016-03-22 DIAGNOSIS — M9901 Segmental and somatic dysfunction of cervical region: Secondary | ICD-10-CM | POA: Diagnosis not present

## 2016-03-22 DIAGNOSIS — M50322 Other cervical disc degeneration at C5-C6 level: Secondary | ICD-10-CM | POA: Diagnosis not present

## 2016-03-22 DIAGNOSIS — M9905 Segmental and somatic dysfunction of pelvic region: Secondary | ICD-10-CM | POA: Diagnosis not present

## 2016-03-22 DIAGNOSIS — Q72811 Congenital shortening of right lower limb: Secondary | ICD-10-CM | POA: Diagnosis not present

## 2016-03-22 DIAGNOSIS — R51 Headache: Secondary | ICD-10-CM | POA: Diagnosis not present

## 2016-03-24 DIAGNOSIS — M9901 Segmental and somatic dysfunction of cervical region: Secondary | ICD-10-CM | POA: Diagnosis not present

## 2016-03-24 DIAGNOSIS — M9905 Segmental and somatic dysfunction of pelvic region: Secondary | ICD-10-CM | POA: Diagnosis not present

## 2016-03-24 DIAGNOSIS — Q72811 Congenital shortening of right lower limb: Secondary | ICD-10-CM | POA: Diagnosis not present

## 2016-03-24 DIAGNOSIS — R51 Headache: Secondary | ICD-10-CM | POA: Diagnosis not present

## 2016-03-24 DIAGNOSIS — M50322 Other cervical disc degeneration at C5-C6 level: Secondary | ICD-10-CM | POA: Diagnosis not present

## 2016-03-25 DIAGNOSIS — Q72811 Congenital shortening of right lower limb: Secondary | ICD-10-CM | POA: Diagnosis not present

## 2016-03-25 DIAGNOSIS — M9901 Segmental and somatic dysfunction of cervical region: Secondary | ICD-10-CM | POA: Diagnosis not present

## 2016-03-25 DIAGNOSIS — M542 Cervicalgia: Secondary | ICD-10-CM | POA: Diagnosis not present

## 2016-03-25 DIAGNOSIS — R51 Headache: Secondary | ICD-10-CM | POA: Diagnosis not present

## 2016-03-25 DIAGNOSIS — G518 Other disorders of facial nerve: Secondary | ICD-10-CM | POA: Diagnosis not present

## 2016-03-25 DIAGNOSIS — M791 Myalgia: Secondary | ICD-10-CM | POA: Diagnosis not present

## 2016-03-25 DIAGNOSIS — M9905 Segmental and somatic dysfunction of pelvic region: Secondary | ICD-10-CM | POA: Diagnosis not present

## 2016-03-25 DIAGNOSIS — M50322 Other cervical disc degeneration at C5-C6 level: Secondary | ICD-10-CM | POA: Diagnosis not present

## 2016-03-25 DIAGNOSIS — G43719 Chronic migraine without aura, intractable, without status migrainosus: Secondary | ICD-10-CM | POA: Diagnosis not present

## 2016-03-29 DIAGNOSIS — Q72811 Congenital shortening of right lower limb: Secondary | ICD-10-CM | POA: Diagnosis not present

## 2016-03-29 DIAGNOSIS — M9905 Segmental and somatic dysfunction of pelvic region: Secondary | ICD-10-CM | POA: Diagnosis not present

## 2016-03-29 DIAGNOSIS — R51 Headache: Secondary | ICD-10-CM | POA: Diagnosis not present

## 2016-03-29 DIAGNOSIS — M9901 Segmental and somatic dysfunction of cervical region: Secondary | ICD-10-CM | POA: Diagnosis not present

## 2016-03-29 DIAGNOSIS — M50322 Other cervical disc degeneration at C5-C6 level: Secondary | ICD-10-CM | POA: Diagnosis not present

## 2016-03-30 DIAGNOSIS — R51 Headache: Secondary | ICD-10-CM | POA: Diagnosis not present

## 2016-03-30 DIAGNOSIS — K5900 Constipation, unspecified: Secondary | ICD-10-CM | POA: Diagnosis not present

## 2016-03-30 DIAGNOSIS — M50322 Other cervical disc degeneration at C5-C6 level: Secondary | ICD-10-CM | POA: Diagnosis not present

## 2016-03-30 DIAGNOSIS — M9901 Segmental and somatic dysfunction of cervical region: Secondary | ICD-10-CM | POA: Diagnosis not present

## 2016-03-30 DIAGNOSIS — M9905 Segmental and somatic dysfunction of pelvic region: Secondary | ICD-10-CM | POA: Diagnosis not present

## 2016-03-30 DIAGNOSIS — Q72811 Congenital shortening of right lower limb: Secondary | ICD-10-CM | POA: Diagnosis not present

## 2016-03-30 DIAGNOSIS — N764 Abscess of vulva: Secondary | ICD-10-CM | POA: Diagnosis not present

## 2016-03-31 DIAGNOSIS — Q72811 Congenital shortening of right lower limb: Secondary | ICD-10-CM | POA: Diagnosis not present

## 2016-03-31 DIAGNOSIS — R51 Headache: Secondary | ICD-10-CM | POA: Diagnosis not present

## 2016-03-31 DIAGNOSIS — M9905 Segmental and somatic dysfunction of pelvic region: Secondary | ICD-10-CM | POA: Diagnosis not present

## 2016-03-31 DIAGNOSIS — M50322 Other cervical disc degeneration at C5-C6 level: Secondary | ICD-10-CM | POA: Diagnosis not present

## 2016-03-31 DIAGNOSIS — M9901 Segmental and somatic dysfunction of cervical region: Secondary | ICD-10-CM | POA: Diagnosis not present

## 2016-04-01 DIAGNOSIS — Q72811 Congenital shortening of right lower limb: Secondary | ICD-10-CM | POA: Diagnosis not present

## 2016-04-01 DIAGNOSIS — M9905 Segmental and somatic dysfunction of pelvic region: Secondary | ICD-10-CM | POA: Diagnosis not present

## 2016-04-01 DIAGNOSIS — M50322 Other cervical disc degeneration at C5-C6 level: Secondary | ICD-10-CM | POA: Diagnosis not present

## 2016-04-01 DIAGNOSIS — M9901 Segmental and somatic dysfunction of cervical region: Secondary | ICD-10-CM | POA: Diagnosis not present

## 2016-04-01 DIAGNOSIS — R51 Headache: Secondary | ICD-10-CM | POA: Diagnosis not present

## 2016-04-02 DIAGNOSIS — D239 Other benign neoplasm of skin, unspecified: Secondary | ICD-10-CM | POA: Diagnosis not present

## 2016-04-02 DIAGNOSIS — L570 Actinic keratosis: Secondary | ICD-10-CM | POA: Diagnosis not present

## 2016-04-02 DIAGNOSIS — Z09 Encounter for follow-up examination after completed treatment for conditions other than malignant neoplasm: Secondary | ICD-10-CM | POA: Diagnosis not present

## 2016-04-02 DIAGNOSIS — M25562 Pain in left knee: Secondary | ICD-10-CM | POA: Diagnosis not present

## 2016-04-02 DIAGNOSIS — Z96652 Presence of left artificial knee joint: Secondary | ICD-10-CM | POA: Diagnosis not present

## 2016-04-05 DIAGNOSIS — M50322 Other cervical disc degeneration at C5-C6 level: Secondary | ICD-10-CM | POA: Diagnosis not present

## 2016-04-05 DIAGNOSIS — R51 Headache: Secondary | ICD-10-CM | POA: Diagnosis not present

## 2016-04-05 DIAGNOSIS — M9905 Segmental and somatic dysfunction of pelvic region: Secondary | ICD-10-CM | POA: Diagnosis not present

## 2016-04-05 DIAGNOSIS — Q72811 Congenital shortening of right lower limb: Secondary | ICD-10-CM | POA: Diagnosis not present

## 2016-04-05 DIAGNOSIS — M9901 Segmental and somatic dysfunction of cervical region: Secondary | ICD-10-CM | POA: Diagnosis not present

## 2016-04-06 ENCOUNTER — Other Ambulatory Visit: Payer: Self-pay | Admitting: Family Medicine

## 2016-04-08 ENCOUNTER — Other Ambulatory Visit: Payer: Self-pay | Admitting: Chiropractor

## 2016-04-08 DIAGNOSIS — M9905 Segmental and somatic dysfunction of pelvic region: Secondary | ICD-10-CM | POA: Diagnosis not present

## 2016-04-08 DIAGNOSIS — M50322 Other cervical disc degeneration at C5-C6 level: Secondary | ICD-10-CM | POA: Diagnosis not present

## 2016-04-08 DIAGNOSIS — R519 Headache, unspecified: Secondary | ICD-10-CM

## 2016-04-08 DIAGNOSIS — R51 Headache: Secondary | ICD-10-CM | POA: Diagnosis not present

## 2016-04-08 DIAGNOSIS — Q72811 Congenital shortening of right lower limb: Secondary | ICD-10-CM | POA: Diagnosis not present

## 2016-04-08 DIAGNOSIS — M9901 Segmental and somatic dysfunction of cervical region: Secondary | ICD-10-CM | POA: Diagnosis not present

## 2016-04-12 DIAGNOSIS — M9905 Segmental and somatic dysfunction of pelvic region: Secondary | ICD-10-CM | POA: Diagnosis not present

## 2016-04-12 DIAGNOSIS — R51 Headache: Secondary | ICD-10-CM | POA: Diagnosis not present

## 2016-04-12 DIAGNOSIS — Q72811 Congenital shortening of right lower limb: Secondary | ICD-10-CM | POA: Diagnosis not present

## 2016-04-12 DIAGNOSIS — M50322 Other cervical disc degeneration at C5-C6 level: Secondary | ICD-10-CM | POA: Diagnosis not present

## 2016-04-12 DIAGNOSIS — M9901 Segmental and somatic dysfunction of cervical region: Secondary | ICD-10-CM | POA: Diagnosis not present

## 2016-04-13 DIAGNOSIS — R51 Headache: Secondary | ICD-10-CM | POA: Diagnosis not present

## 2016-04-13 DIAGNOSIS — Q72811 Congenital shortening of right lower limb: Secondary | ICD-10-CM | POA: Diagnosis not present

## 2016-04-13 DIAGNOSIS — M9901 Segmental and somatic dysfunction of cervical region: Secondary | ICD-10-CM | POA: Diagnosis not present

## 2016-04-13 DIAGNOSIS — M9905 Segmental and somatic dysfunction of pelvic region: Secondary | ICD-10-CM | POA: Diagnosis not present

## 2016-04-13 DIAGNOSIS — M50322 Other cervical disc degeneration at C5-C6 level: Secondary | ICD-10-CM | POA: Diagnosis not present

## 2016-04-14 ENCOUNTER — Ambulatory Visit: Admission: RE | Admit: 2016-04-14 | Payer: BLUE CROSS/BLUE SHIELD | Source: Ambulatory Visit

## 2016-04-14 ENCOUNTER — Ambulatory Visit (INDEPENDENT_AMBULATORY_CARE_PROVIDER_SITE_OTHER): Payer: BLUE CROSS/BLUE SHIELD | Admitting: Family Medicine

## 2016-04-14 VITALS — BP 120/80 | HR 86 | Temp 98.3°F | Ht 64.5 in | Wt 151.9 lb

## 2016-04-14 DIAGNOSIS — R51 Headache: Secondary | ICD-10-CM

## 2016-04-14 DIAGNOSIS — R519 Headache, unspecified: Secondary | ICD-10-CM

## 2016-04-14 NOTE — Patient Instructions (Signed)

## 2016-04-14 NOTE — Progress Notes (Signed)
Pre visit review using our clinic review tool, if applicable. No additional management support is needed unless otherwise documented below in the visit note. 

## 2016-04-14 NOTE — Progress Notes (Signed)
Subjective:     Patient ID: Gabriela Shepard, female   DOB: Oct 25, 1960, 55 y.o.   MRN: [id number]  HPI Patient seen today with progressive/ worsening of headaches in terms of frequency and severity. She has long history of migraine headaches. She has recently seen neurology headache specialist and is currently being treated with Zonegran 50 mg once daily. She's also had some recent low back pain and has been evaluated by chiropractic provider.  When she was seen by neurology there was concern that she may be having some rebound headaches. She has not been taking any triptan's. She is relating approximately 3 episodes per week of severe headache usually located temporal region and alternating between the right left side. Headaches and sometimes last up to 3 days. She has frequent throbbing pain with nausea. These headaches are different than her typical migraines in terms of frequency and also severity. She states they're almost unbearable at times. She's had some trigger point injections which have not seemed to help much thus far. She denies any visual changes. She has noted some tenderness in the temporal region with flareups. No focal weakness. No confusion.  She has noted that movement (even simple things like tying her shoe or stooping over) worsens her headache. Denies any fevers or chills.  Past Medical History:  Diagnosis Date  . Arthritis   . Depression   . Headache   . 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 History of UTI   . PONV (postoperative nausea and vomiting)   . Urine incontinence    Past Surgical History:  Procedure Laterality Date  . CESAREAN SECTION  1992  . INCONTINENCE SURGERY    . LAPAROSCOPY     under bladder  . TOTAL KNEE ARTHROPLASTY Left 12/23/2015   Procedure: TOTAL KNEE ARTHROPLASTY;  Surgeon: Marcene Corning, MD;  Location[facility];  Service: Orthopedics;  Laterality: Left;    reports that she quit smoking about 5 years ago. Her smoking use  included Cigarettes. She has a 5.00 pack-year smoking history. She has never used smokeless tobacco. She reports that she does not drink alcohol or use drugs. family history includes Alcohol abuse in her other; Arthritis in her other; Hypertension in her mother and other; Mental illness in her other. Allergies  Allergen Reactions  . Sulfa Antibiotics Hives     Review of Systems  Constitutional: Positive for fatigue. Negative for chills and fever.  Eyes: Positive for photophobia. Negative for visual disturbance.  Respiratory: Negative for cough and shortness of breath.   Cardiovascular: Negative for chest pain.  Gastrointestinal: Positive for nausea. Negative for abdominal pain.  Genitourinary: Negative for dysuria.  Neurological: Positive for headaches. Negative for seizures, syncope, speech difficulty and weakness.  Psychiatric/Behavioral: Negative for confusion.       Objective:   Physical Exam  Constitutional: She is oriented to person, place, and time. She appears well-developed and well-nourished.  HENT:  Head: Normocephalic and atraumatic.  Mild bilateral tenderness in temporal region.  Eyes: Pupils are equal, round, and reactive to light.  Neck: Neck supple. No thyromegaly present.  Cardiovascular: Normal rate and regular rhythm.   Pulmonary/Chest: Effort normal and breath sounds normal. No respiratory distress. She has no wheezes. She has no rales.  Musculoskeletal: She exhibits no edema.  Neurological: She is alert and oriented to person, place, and time. No cranial nerve deficit. Coordination normal.  Skin: No rash noted.  Psychiatric: She has a normal mood  and affect. Her behavior is normal.       Assessment:     #1 history of migraine headaches  #2 progressive headaches in terms of frequency and intensity which may be related to #1. Doubt temporal arteritis. She does describe frequent location which is temporal but has not had any acute visual disturbance-but with  ago > 50 needs ESR.      Plan:     -Check sedimentation rate -She is encouraged to continue follow-up with neurology -Consider MRI brain MR angiogram given increased frequency and severity of headaches -She is considering change to another neurologist for management of her headaches  Eulas Post MD [facility] at [facility]

## 2016-04-15 DIAGNOSIS — M9905 Segmental and somatic dysfunction of pelvic region: Secondary | ICD-10-CM | POA: Diagnosis not present

## 2016-04-15 DIAGNOSIS — M50322 Other cervical disc degeneration at C5-C6 level: Secondary | ICD-10-CM | POA: Diagnosis not present

## 2016-04-15 DIAGNOSIS — M9901 Segmental and somatic dysfunction of cervical region: Secondary | ICD-10-CM | POA: Diagnosis not present

## 2016-04-15 DIAGNOSIS — Q72811 Congenital shortening of right lower limb: Secondary | ICD-10-CM | POA: Diagnosis not present

## 2016-04-15 DIAGNOSIS — R51 Headache: Secondary | ICD-10-CM | POA: Diagnosis not present

## 2016-04-15 LAB — SEDIMENTATION RATE: SED RATE: 19 mm/h (ref 0–30)

## 2016-04-19 ENCOUNTER — Telehealth: Payer: Self-pay | Admitting: Family Medicine

## 2016-04-19 DIAGNOSIS — R51 Headache: Secondary | ICD-10-CM | POA: Diagnosis not present

## 2016-04-19 DIAGNOSIS — M9901 Segmental and somatic dysfunction of cervical region: Secondary | ICD-10-CM | POA: Diagnosis not present

## 2016-04-19 DIAGNOSIS — Q72811 Congenital shortening of right lower limb: Secondary | ICD-10-CM | POA: Diagnosis not present

## 2016-04-19 DIAGNOSIS — M50322 Other cervical disc degeneration at C5-C6 level: Secondary | ICD-10-CM | POA: Diagnosis not present

## 2016-04-19 DIAGNOSIS — M9905 Segmental and somatic dysfunction of pelvic region: Secondary | ICD-10-CM | POA: Diagnosis not present

## 2016-04-19 NOTE — Telephone Encounter (Signed)
error 

## 2016-04-20 DIAGNOSIS — M9905 Segmental and somatic dysfunction of pelvic region: Secondary | ICD-10-CM | POA: Diagnosis not present

## 2016-04-20 DIAGNOSIS — Q72811 Congenital shortening of right lower limb: Secondary | ICD-10-CM | POA: Diagnosis not present

## 2016-04-20 DIAGNOSIS — M50322 Other cervical disc degeneration at C5-C6 level: Secondary | ICD-10-CM | POA: Diagnosis not present

## 2016-04-20 DIAGNOSIS — M9901 Segmental and somatic dysfunction of cervical region: Secondary | ICD-10-CM | POA: Diagnosis not present

## 2016-04-20 DIAGNOSIS — R51 Headache: Secondary | ICD-10-CM | POA: Diagnosis not present

## 2016-04-22 DIAGNOSIS — Q72811 Congenital shortening of right lower limb: Secondary | ICD-10-CM | POA: Diagnosis not present

## 2016-04-22 DIAGNOSIS — R51 Headache: Secondary | ICD-10-CM | POA: Diagnosis not present

## 2016-04-22 DIAGNOSIS — M9905 Segmental and somatic dysfunction of pelvic region: Secondary | ICD-10-CM | POA: Diagnosis not present

## 2016-04-22 DIAGNOSIS — M9901 Segmental and somatic dysfunction of cervical region: Secondary | ICD-10-CM | POA: Diagnosis not present

## 2016-04-22 DIAGNOSIS — M50322 Other cervical disc degeneration at C5-C6 level: Secondary | ICD-10-CM | POA: Diagnosis not present

## 2016-04-26 DIAGNOSIS — R51 Headache: Secondary | ICD-10-CM | POA: Diagnosis not present

## 2016-04-26 DIAGNOSIS — M9905 Segmental and somatic dysfunction of pelvic region: Secondary | ICD-10-CM | POA: Diagnosis not present

## 2016-04-26 DIAGNOSIS — Q72811 Congenital shortening of right lower limb: Secondary | ICD-10-CM | POA: Diagnosis not present

## 2016-04-26 DIAGNOSIS — M9901 Segmental and somatic dysfunction of cervical region: Secondary | ICD-10-CM | POA: Diagnosis not present

## 2016-04-26 DIAGNOSIS — M50322 Other cervical disc degeneration at C5-C6 level: Secondary | ICD-10-CM | POA: Diagnosis not present

## 2016-04-27 ENCOUNTER — Encounter: Payer: Self-pay | Admitting: Gastroenterology

## 2016-04-27 DIAGNOSIS — R51 Headache: Secondary | ICD-10-CM | POA: Diagnosis not present

## 2016-04-27 DIAGNOSIS — M9901 Segmental and somatic dysfunction of cervical region: Secondary | ICD-10-CM | POA: Diagnosis not present

## 2016-04-27 DIAGNOSIS — Q72811 Congenital shortening of right lower limb: Secondary | ICD-10-CM | POA: Diagnosis not present

## 2016-04-27 DIAGNOSIS — M50322 Other cervical disc degeneration at C5-C6 level: Secondary | ICD-10-CM | POA: Diagnosis not present

## 2016-04-27 DIAGNOSIS — M9905 Segmental and somatic dysfunction of pelvic region: Secondary | ICD-10-CM | POA: Diagnosis not present

## 2016-04-28 ENCOUNTER — Other Ambulatory Visit: Payer: BLUE CROSS/BLUE SHIELD

## 2016-05-02 ENCOUNTER — Other Ambulatory Visit: Payer: Self-pay | Admitting: Family Medicine

## 2016-05-04 ENCOUNTER — Other Ambulatory Visit: Payer: Self-pay | Admitting: Family Medicine

## 2016-05-06 DIAGNOSIS — R51 Headache: Secondary | ICD-10-CM | POA: Diagnosis not present

## 2016-05-06 DIAGNOSIS — Q72811 Congenital shortening of right lower limb: Secondary | ICD-10-CM | POA: Diagnosis not present

## 2016-05-06 DIAGNOSIS — M9901 Segmental and somatic dysfunction of cervical region: Secondary | ICD-10-CM | POA: Diagnosis not present

## 2016-05-06 DIAGNOSIS — M50322 Other cervical disc degeneration at C5-C6 level: Secondary | ICD-10-CM | POA: Diagnosis not present

## 2016-05-06 DIAGNOSIS — M9905 Segmental and somatic dysfunction of pelvic region: Secondary | ICD-10-CM | POA: Diagnosis not present

## 2016-05-07 DIAGNOSIS — Q72811 Congenital shortening of right lower limb: Secondary | ICD-10-CM | POA: Diagnosis not present

## 2016-05-07 DIAGNOSIS — R51 Headache: Secondary | ICD-10-CM | POA: Diagnosis not present

## 2016-05-07 DIAGNOSIS — M50322 Other cervical disc degeneration at C5-C6 level: Secondary | ICD-10-CM | POA: Diagnosis not present

## 2016-05-07 DIAGNOSIS — M9905 Segmental and somatic dysfunction of pelvic region: Secondary | ICD-10-CM | POA: Diagnosis not present

## 2016-05-07 DIAGNOSIS — M9901 Segmental and somatic dysfunction of cervical region: Secondary | ICD-10-CM | POA: Diagnosis not present

## 2016-05-08 ENCOUNTER — Other Ambulatory Visit: Payer: BLUE CROSS/BLUE SHIELD

## 2016-05-11 DIAGNOSIS — M50322 Other cervical disc degeneration at C5-C6 level: Secondary | ICD-10-CM | POA: Diagnosis not present

## 2016-05-11 DIAGNOSIS — Q72811 Congenital shortening of right lower limb: Secondary | ICD-10-CM | POA: Diagnosis not present

## 2016-05-11 DIAGNOSIS — M9901 Segmental and somatic dysfunction of cervical region: Secondary | ICD-10-CM | POA: Diagnosis not present

## 2016-05-11 DIAGNOSIS — R51 Headache: Secondary | ICD-10-CM | POA: Diagnosis not present

## 2016-05-11 DIAGNOSIS — M9905 Segmental and somatic dysfunction of pelvic region: Secondary | ICD-10-CM | POA: Diagnosis not present

## 2016-05-12 DIAGNOSIS — M9905 Segmental and somatic dysfunction of pelvic region: Secondary | ICD-10-CM | POA: Diagnosis not present

## 2016-05-12 DIAGNOSIS — M50322 Other cervical disc degeneration at C5-C6 level: Secondary | ICD-10-CM | POA: Diagnosis not present

## 2016-05-12 DIAGNOSIS — M9901 Segmental and somatic dysfunction of cervical region: Secondary | ICD-10-CM | POA: Diagnosis not present

## 2016-05-12 DIAGNOSIS — R51 Headache: Secondary | ICD-10-CM | POA: Diagnosis not present

## 2016-05-12 DIAGNOSIS — Q72811 Congenital shortening of right lower limb: Secondary | ICD-10-CM | POA: Diagnosis not present

## 2016-05-13 DIAGNOSIS — R51 Headache: Secondary | ICD-10-CM | POA: Diagnosis not present

## 2016-05-13 DIAGNOSIS — M9901 Segmental and somatic dysfunction of cervical region: Secondary | ICD-10-CM | POA: Diagnosis not present

## 2016-05-13 DIAGNOSIS — M9905 Segmental and somatic dysfunction of pelvic region: Secondary | ICD-10-CM | POA: Diagnosis not present

## 2016-05-13 DIAGNOSIS — M50322 Other cervical disc degeneration at C5-C6 level: Secondary | ICD-10-CM | POA: Diagnosis not present

## 2016-05-13 DIAGNOSIS — Q72811 Congenital shortening of right lower limb: Secondary | ICD-10-CM | POA: Diagnosis not present

## 2016-05-17 DIAGNOSIS — R51 Headache: Secondary | ICD-10-CM | POA: Diagnosis not present

## 2016-05-17 DIAGNOSIS — Q72811 Congenital shortening of right lower limb: Secondary | ICD-10-CM | POA: Diagnosis not present

## 2016-05-17 DIAGNOSIS — M50322 Other cervical disc degeneration at C5-C6 level: Secondary | ICD-10-CM | POA: Diagnosis not present

## 2016-05-17 DIAGNOSIS — M9905 Segmental and somatic dysfunction of pelvic region: Secondary | ICD-10-CM | POA: Diagnosis not present

## 2016-05-17 DIAGNOSIS — M9901 Segmental and somatic dysfunction of cervical region: Secondary | ICD-10-CM | POA: Diagnosis not present

## 2016-05-18 DIAGNOSIS — M50322 Other cervical disc degeneration at C5-C6 level: Secondary | ICD-10-CM | POA: Diagnosis not present

## 2016-05-18 DIAGNOSIS — R51 Headache: Secondary | ICD-10-CM | POA: Diagnosis not present

## 2016-05-18 DIAGNOSIS — M9905 Segmental and somatic dysfunction of pelvic region: Secondary | ICD-10-CM | POA: Diagnosis not present

## 2016-05-18 DIAGNOSIS — M9901 Segmental and somatic dysfunction of cervical region: Secondary | ICD-10-CM | POA: Diagnosis not present

## 2016-05-18 DIAGNOSIS — Q72811 Congenital shortening of right lower limb: Secondary | ICD-10-CM | POA: Diagnosis not present

## 2016-05-20 DIAGNOSIS — R51 Headache: Secondary | ICD-10-CM | POA: Diagnosis not present

## 2016-05-20 DIAGNOSIS — M9901 Segmental and somatic dysfunction of cervical region: Secondary | ICD-10-CM | POA: Diagnosis not present

## 2016-05-20 DIAGNOSIS — Q72811 Congenital shortening of right lower limb: Secondary | ICD-10-CM | POA: Diagnosis not present

## 2016-05-20 DIAGNOSIS — M50322 Other cervical disc degeneration at C5-C6 level: Secondary | ICD-10-CM | POA: Diagnosis not present

## 2016-05-20 DIAGNOSIS — M9905 Segmental and somatic dysfunction of pelvic region: Secondary | ICD-10-CM | POA: Diagnosis not present

## 2016-05-21 ENCOUNTER — Other Ambulatory Visit: Payer: Self-pay | Admitting: Family Medicine

## 2016-05-25 NOTE — Telephone Encounter (Signed)
Refill once. She has tapered back and I would really like to see her try to taper off if possible over the next few months.

## 2016-05-25 NOTE — Telephone Encounter (Signed)
Last OV 04-14-2016 Last refill 03-01-2016 #90 Please advise on refills

## 2016-06-03 DIAGNOSIS — G43719 Chronic migraine without aura, intractable, without status migrainosus: Secondary | ICD-10-CM | POA: Diagnosis not present

## 2016-06-04 ENCOUNTER — Ambulatory Visit (INDEPENDENT_AMBULATORY_CARE_PROVIDER_SITE_OTHER): Payer: BLUE CROSS/BLUE SHIELD | Admitting: Family Medicine

## 2016-06-04 ENCOUNTER — Encounter: Payer: Self-pay | Admitting: Family Medicine

## 2016-06-04 VITALS — BP 123/83 | HR 76 | Temp 97.6°F | Resp 16 | Wt 154.8 lb

## 2016-06-04 DIAGNOSIS — J0100 Acute maxillary sinusitis, unspecified: Secondary | ICD-10-CM

## 2016-06-04 MED ORDER — AMOXICILLIN-POT CLAVULANATE 875-125 MG PO TABS: 1.0000 | ORAL_TABLET | Freq: Two times a day (BID) | ORAL | 0 refills | Status: DC

## 2016-06-04 NOTE — Progress Notes (Signed)
Pre visit review using our clinic review tool, if applicable. No additional management support is needed unless otherwise documented below in the visit note. 

## 2016-06-04 NOTE — Patient Instructions (Signed)
Augmentin every 12 hours for 10 days for sinus infection.  Nasal irrigation/saline. Rest and hydrate.  Mucinex    Sinusitis, Adult Sinusitis is soreness and inflammation of your sinuses. Sinuses are hollow spaces in the bones around your face. They are located:  Around your eyes.  In the middle of your forehead.  Behind your nose.  In your cheekbones. Your sinuses and nasal passages are lined with a stringy fluid (mucus). Mucus normally drains out of your sinuses. When your nasal tissues get inflamed or swollen, the mucus can get trapped or blocked so air cannot flow through your sinuses. This lets bacteria, viruses, and funguses grow, and that leads to infection. Follow these instructions at home: Medicines  Take, use, or apply over-the-counter and prescription medicines only as told by your doctor. These may include nasal sprays.  If you were prescribed an antibiotic medicine, take it as told by your doctor. Do not stop taking the antibiotic even if you start to feel better. Hydrate and Humidify  Drink enough water to keep your pee (urine) clear or pale yellow.  Use a cool mist humidifier to keep the humidity level in your home above 50%.  Breathe in steam for 10-15 minutes, 3-4 times a day or as told by your doctor. You can do this in the bathroom while a hot shower is running.  Try not to spend time in cool or dry air. Rest  Rest as much as possible.  Sleep with your head raised (elevated).  Make sure to get enough sleep each night. General instructions  Put a warm, moist washcloth on your face 3-4 times a day or as told by your doctor. This will help with discomfort.  Wash your hands often with soap and water. If there is no soap and water, use hand sanitizer.  Do not smoke. Avoid being around people who are smoking (secondhand smoke).  Keep all follow-up visits as told by your doctor. This is important. Contact a doctor if:  You have a fever.  Your symptoms  get worse.  Your symptoms do not get better within 10 days. Get help right away if:  You have a very bad headache.  You cannot stop throwing up (vomiting).  You have pain or swelling around your face or eyes.  You have trouble seeing.  You feel confused.  Your neck is stiff.  You have trouble breathing. This information is not intended to replace advice given to you by your health care provider. Make sure you discuss any questions you have with your health care provider. Document Released: 11/10/2007 Document Revised: 01/18/2016 Document Reviewed: 03/19/2015 Elsevier Interactive Patient Education  2017 [employer].

## 2016-06-04 NOTE — Progress Notes (Signed)
Gabriela Shepard , 1961/04/30, 55 y.o., female MRN: [id number] Patient Care Team    Relationship Specialty Notifications Start End  Eulas Post, MD PCP - General Family Medicine  03/31/11     CC: cough  Subjective: Pt presents for an acute OV with complaints cough of of 2 weeks duration.  Associated symptoms included head congestion, nasal congestion, "Swimmy" ears, sore throat. Pt feels symptoms are becoming progressively worse over the last few days. She has tried using an Insurance claims handler pot, Sudafed and Mucinex to ease her symptoms. She denies fever, chills, nausea, vomit or diarrhea.  Allergies  Allergen Reactions  . Sulfa Antibiotics Hives   Social History  Substance Use Topics  . Smoking status: Former Smoker    Packs/day: 0.50    Years: 10.00    Types: Cigarettes    Quit date: 11/13/2010  . Smokeless tobacco: Never Used  . Alcohol use No   Past Medical History:  Diagnosis Date  . Arthritis   . Depression   . Headache   . 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 History of UTI   . PONV (postoperative nausea and vomiting)   . Urine incontinence    Past Surgical History:  Procedure Laterality Date  . CESAREAN SECTION  1992  . INCONTINENCE SURGERY    . LAPAROSCOPY     under bladder  . TOTAL KNEE ARTHROPLASTY Left 12/23/2015   Procedure: TOTAL KNEE ARTHROPLASTY;  Surgeon: Melrose Nakayama, MD;  Location: [facility];  Service: Orthopedics;  Laterality: Left;   Family History  Problem Relation Age of Onset  . Hypertension Mother   . Alcohol abuse Other   . Arthritis Other   . Hypertension Other   . Mental illness Other   . Colon cancer Neg Hx    Allergies as of 06/04/2016      Reactions   Sulfa Antibiotics Hives      Medication List       Accurate as of 06/04/16  4:11 PM. Always use your most recent med list.          baclofen 10 MG tablet Commonly known as:  LIORESAL Take 10 mg by mouth. Take 1/2 to 1 tablet up to 2x daily. Limit 1-2  treatments/week.   buPROPion 300 MG 24 hr tablet Commonly known as:  WELLBUTRIN XL TAKE 1 TABLET BY MOUTH EVERY DAY   clonazePAM 1 MG tablet Commonly known as:  KLONOPIN TAKE 1 TABLET BY MOUTH 3 TIMES A DAY AS NEEDED   doxycycline 100 MG capsule Commonly known as:  VIBRAMYCIN Take one twice daily as needed for perioral dermatitis flare   FLUoxetine 40 MG capsule Commonly known as:  PROZAC Take 1 capsule (40 mg total) by mouth daily.   hyoscyamine 0.375 MG 12 hr tablet Commonly known as:  LEVBID TAKE 1 TABLET BY MOUTH TWICE A DAY   levocetirizine 5 MG tablet Commonly known as:  XYZAL Take 1 tablet (5 mg total) by mouth every evening.   MINIVELLE 0.0375 MG/24HR Generic drug:  estradiol APPLY 1 PATCH TWICE WEEKLY AS DIRECTED   progesterone 100 MG capsule Commonly known as:  PROMETRIUM Take 100 mg by mouth daily.   promethazine 25 MG tablet Commonly known as:  PHENERGAN TAKE 1 TABLET (25 MG TOTAL) BY MOUTH EVERY 6 (SIX) HOURS AS NEEDED FOR NAUSEA OR VOMITING.   rizatriptan 10 MG tablet Commonly known as:  MAXALT Take 1 tablet (10 mg total)  by mouth as needed for migraine. May repeat in 2 hours if needed   SUMAtriptan 100 MG tablet Commonly known as:  IMITREX TAKE 1 TABLET EVERY 2 HOURS AS NEEDED FOR MIGRAINE   valACYclovir 1000 MG tablet Commonly known as:  VALTREX Take 1,000 mg by mouth as needed (outbreak).   zonisamide 50 MG capsule Commonly known as:  ZONEGRAN Take 50 mg by mouth daily.       No results found for this or any previous visit (from the past 24 hour(s)). No results found.   ROS: Negative, with the exception of above mentioned in HPI   Objective:  BP 123/83 (BP Location: Left Arm, Patient Position: Sitting, Cuff Size: Normal)   Pulse 76   Temp 97.6 F (36.4 C) (Oral)   Resp 16   Wt 154 lb 12.8 oz (70.2 kg)   SpO2 99%   BMI 26.16 kg/m  Body mass index is 26.16 kg/m. Gen: Afebrile. No acute distress. Nontoxic in appearance, well  developed, well nourished.  HENT: AT. [place]. Bilateral TM visualized Within normal limits. MMM, no oral lesions. Bilateral nares erythema, swelling and drainage. Throat without erythema or exudates. Postnasal drip present tender to palpation right maxillary sinus. Eyes:Pupils Equal Round Reactive to light, Extraocular movements intact,  Conjunctiva without redness, discharge or icterus. Neck/lymp/endocrine: Supple, bilateral cervical lymphadenopathy present CV: RRR Chest: CTAB, no wheeze or crackles. Good air movement, normal resp effort.   Assessment/Plan: [name] is a 55 y.o. female present for acute OV for  Acute maxillary sinusitis, recurrence not specified - Augmentin twice a day 10 days - Rest, hydrate, nasal saline, Mucinex -Follow-up when necessary or if not resolved within 2 weeks.   electronically signed by:  Howard Pouch, DO  [place]

## 2016-06-07 HISTORY — PX: POLYPECTOMY: SHX149

## 2016-06-07 HISTORY — PX: TOTAL HIP ARTHROPLASTY: SHX124

## 2016-06-07 HISTORY — PX: COLONOSCOPY: SHX174

## 2016-06-15 ENCOUNTER — Telehealth: Payer: Self-pay | Admitting: Family Medicine

## 2016-06-15 DIAGNOSIS — R519 Headache, unspecified: Secondary | ICD-10-CM

## 2016-06-15 DIAGNOSIS — R51 Headache: Principal | ICD-10-CM

## 2016-06-15 NOTE — Telephone Encounter (Signed)
Pt would like to speak with you concerning the referral to have a MRI.  Pt state that she has spoken with the insurance company and they have approved the MRI and will not approve a MRA until a MRI has been done.  Pt would like to have a call b/c this has been going on since November.

## 2016-06-15 NOTE — Telephone Encounter (Signed)
Per pt her ins co will not cover MRA without MRI done first.   Dr. Elease Hashimoto - Please advise. Thanks!

## 2016-06-15 NOTE — Telephone Encounter (Signed)
This should have been ordered as MRI brain AND MR angiogram.  We need to go ahead and add MRI brain without contrast.

## 2016-06-16 ENCOUNTER — Other Ambulatory Visit: Payer: BLUE CROSS/BLUE SHIELD

## 2016-06-16 NOTE — Telephone Encounter (Signed)
LM for pt that testing has been re-ordered and will be going through the approval process. Someone will contact her to schedule. Nothing further needed.

## 2016-06-22 ENCOUNTER — Ambulatory Visit
Admission: RE | Admit: 2016-06-22 | Discharge: 2016-06-22 | Disposition: A | Discharge status: Home / Self Care | Payer: BLUE CROSS/BLUE SHIELD | Source: Ambulatory Visit | Attending: Family Medicine | Admitting: Family Medicine

## 2016-06-22 DIAGNOSIS — R51 Headache: Secondary | ICD-10-CM | POA: Diagnosis not present

## 2016-06-22 DIAGNOSIS — R519 Headache, unspecified: Secondary | ICD-10-CM

## 2016-06-29 ENCOUNTER — Ambulatory Visit (INDEPENDENT_AMBULATORY_CARE_PROVIDER_SITE_OTHER): Payer: BLUE CROSS/BLUE SHIELD | Admitting: Family Medicine

## 2016-06-29 VITALS — BP 110/82 | HR 96 | Temp 97.8°F | Ht 64.5 in | Wt 151.0 lb

## 2016-06-29 DIAGNOSIS — J329 Chronic sinusitis, unspecified: Secondary | ICD-10-CM | POA: Diagnosis not present

## 2016-06-29 DIAGNOSIS — G43909 Migraine, unspecified, not intractable, without status migrainosus: Secondary | ICD-10-CM

## 2016-06-29 MED ORDER — LEVOFLOXACIN 500 MG PO TABS: 500.0000 mg | ORAL_TABLET | Freq: Every day | ORAL | 0 refills | Status: DC

## 2016-06-29 NOTE — Progress Notes (Signed)
Subjective:     Patient ID: Gabriela Shepard, female   DOB: [birth year], 56 y.o.   MRN: [id number]  HPI Since here with some persistent sinusitis symptoms. She has bilateral maxillary and occasional frontal sinus pressure. frequent thick mucus. She was seen back before Christmas and treated with Augmentin and did not improve much and subsequently treated doxycycline. She's not seen much improvement either time. She has some pervasive fatigue. She states this is the way she's felt with sinusitis in the past. No fever. No bloody nasal discharge. No teeth pain.  She has history of complicated migraines with some atypical symptoms. She had recent MRI of the brain which showed no evidence for acute or remote infarction or any hemorrhage. She had 4 mm hypodense nodular lesion parasagittal vertex felt to be likely meningioma  Patient has been followed by neurology and is currently taking Zonegran 50 mg for migraine prevention and that seems to be working. She has previously been followed at the [facility]. She is specifically requesting transfer to [facility] neurology for follow-up for her migraines. She had tremendous stress recently with fact that her son has been dealing with depression with recent admission.  Past Medical History:  Diagnosis Date  . Arthritis   . Depression   . Headache   . 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 History of UTI   . PONV (postoperative nausea and vomiting)   . Urine incontinence    Past Surgical History:  Procedure Laterality Date  . CESAREAN SECTION  1992  . INCONTINENCE SURGERY    . LAPAROSCOPY     under bladder  . 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 Her smoking use included Cigarettes. She has a 5.00 pack-year smoking history. She has never used  smokeless tobacco. She reports that she does not drink alcohol or use drugs. family history includes Alcohol abuse in her other; Arthritis in her other; Hypertension in her mother and other; Mental illness in her other. Allergies  Allergen Reactions  . Sulfa Antibiotics Hives    Review of Systems  Constitutional: Positive for fatigue. Negative for chills and fever.  HENT: Positive for congestion, sinus pain and sinus pressure.   Respiratory: Negative for cough.   Cardiovascular: Negative for chest pain.  Neurological: Positive for headaches. Negative for weakness.       Objective:   Physical Exam  Constitutional: She appears well-developed and well-nourished.  HENT:  Right Ear: External ear normal.  Left Ear: External ear normal.  Mouth/Throat: Oropharynx is clear and moist.  Eyes: Pupils are equal, round, and reactive to light.  Neck: Neck supple.  Cardiovascular: Normal rate and regular rhythm.   Pulmonary/Chest: Effort normal and breath sounds normal. No respiratory distress. She has no wheezes. She has no rales.  Musculoskeletal: She exhibits no edema.       Assessment:     #1 persistent sinus congestive symptoms-?chronic sinusitis, though recent MRI did not show significant sinus disease..  #2 history of frequent migraine headaches    Plan:     -Referral to [facility] neurology- pt requesting their input regarding migraine management.   -We decided to go ahead and treat with Levaquin 500 mg po qd for 10 days -touch base if sinus symptoms not improving over the  next week.  Eulas Post MD [facility] at [facility]

## 2016-06-29 NOTE — Patient Instructions (Signed)
Touch base in 2 weeks if fatigue not better.

## 2016-07-07 DIAGNOSIS — M9905 Segmental and somatic dysfunction of pelvic region: Secondary | ICD-10-CM | POA: Diagnosis not present

## 2016-07-07 DIAGNOSIS — M25551 Pain in right hip: Secondary | ICD-10-CM | POA: Diagnosis not present

## 2016-07-07 DIAGNOSIS — M5137 Other intervertebral disc degeneration, lumbosacral region: Secondary | ICD-10-CM | POA: Diagnosis not present

## 2016-07-07 DIAGNOSIS — M9903 Segmental and somatic dysfunction of lumbar region: Secondary | ICD-10-CM | POA: Diagnosis not present

## 2016-07-08 DIAGNOSIS — M25551 Pain in right hip: Secondary | ICD-10-CM | POA: Diagnosis not present

## 2016-07-08 DIAGNOSIS — M9905 Segmental and somatic dysfunction of pelvic region: Secondary | ICD-10-CM | POA: Diagnosis not present

## 2016-07-08 DIAGNOSIS — M9903 Segmental and somatic dysfunction of lumbar region: Secondary | ICD-10-CM | POA: Diagnosis not present

## 2016-07-08 DIAGNOSIS — M5137 Other intervertebral disc degeneration, lumbosacral region: Secondary | ICD-10-CM | POA: Diagnosis not present

## 2016-07-12 ENCOUNTER — Ambulatory Visit (INDEPENDENT_AMBULATORY_CARE_PROVIDER_SITE_OTHER): Payer: BLUE CROSS/BLUE SHIELD | Admitting: Family Medicine

## 2016-07-12 VITALS — BP 100/70 | HR 74 | Temp 97.8°F | Ht 64.5 in | Wt 157.0 lb

## 2016-07-12 DIAGNOSIS — J329 Chronic sinusitis, unspecified: Secondary | ICD-10-CM | POA: Diagnosis not present

## 2016-07-12 MED ORDER — LEVOFLOXACIN 500 MG PO TABS: 500.0000 mg | ORAL_TABLET | Freq: Every day | ORAL | 0 refills | Status: DC

## 2016-07-12 NOTE — Progress Notes (Signed)
Pre visit review using our clinic review tool, if applicable. No additional management support is needed unless otherwise documented below in the visit note. 

## 2016-07-12 NOTE — Progress Notes (Signed)
Subjective:     Patient ID: Gabriela Shepard, female   DOB: 12-25-1960, 56 y.o.   MRN: [id number]  HPI Patient's comes in battling sinus issues over past couple months. She was treated back in late 2017 with course of Augmentin and then subsequent doxycycline. She did not feel much better with either course. She had frequent frontal and maxillary sinus pressure and headaches and malaise. We treated last visit with Levaquin and she states after couple days antibiotic felt best that she has in several months. She felt well until about 3-4 days after finishing antibiotic. Since that time she's had recurrence of sore throat, dry cough, and frontal and maxillary sinus pressure. Using saline irrigation daily. No bloody discharge. Occasional postnasal drip.  Past Medical History:  Diagnosis Date  . Arthritis   . Depression   . Headache   . 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 History of UTI   . PONV (postoperative nausea and vomiting)   . Urine incontinence    Past Surgical History:  Procedure Laterality Date  . CESAREAN SECTION  1992  . INCONTINENCE SURGERY    . LAPAROSCOPY     under bladder  . 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 Her smoking use included Cigarettes. She has a 5.00 pack-year smoking history. She has never used smokeless tobacco. She reports that she does not drink alcohol or use drugs. family history includes Alcohol abuse in her other; Arthritis in her other; Hypertension in her mother and other; Mental illness in her other. Allergies  Allergen Reactions  . Sulfa Antibiotics Hives     Review of Systems  Constitutional: Positive for fatigue. Negative for chills and fever.  HENT: Positive for sinus pain, sinus pressure and sore throat.   Respiratory: Positive for cough.    Neurological: Positive for headaches.       Objective:   Physical Exam  Constitutional: She appears well-developed and well-nourished.  HENT:  Right Ear: External ear normal.  Left Ear: External ear normal.  Mouth/Throat: Oropharynx is clear and moist.  Erythematous nasal mucosa, otherwise clear  Cardiovascular: Normal rate and regular rhythm.   Pulmonary/Chest: Effort normal and breath sounds normal. No respiratory distress. She has no wheezes. She has no rales.       Assessment:     Chronic versus recurrent sinusitis frontal and maxillary bilateral    Plan:     -Continue saline nasal irrigation -Continue decongestants as needed -Repeat course of Levaquin 500 milligrams daily for 10 days. -Consider ENT referral if not improved with this  Eulas Post MD [facility] at [facility]

## 2016-07-14 DIAGNOSIS — M5137 Other intervertebral disc degeneration, lumbosacral region: Secondary | ICD-10-CM | POA: Diagnosis not present

## 2016-07-14 DIAGNOSIS — M25551 Pain in right hip: Secondary | ICD-10-CM | POA: Diagnosis not present

## 2016-07-14 DIAGNOSIS — M9903 Segmental and somatic dysfunction of lumbar region: Secondary | ICD-10-CM | POA: Diagnosis not present

## 2016-07-14 DIAGNOSIS — M9905 Segmental and somatic dysfunction of pelvic region: Secondary | ICD-10-CM | POA: Diagnosis not present

## 2016-07-15 DIAGNOSIS — M25551 Pain in right hip: Secondary | ICD-10-CM | POA: Diagnosis not present

## 2016-07-15 DIAGNOSIS — M9905 Segmental and somatic dysfunction of pelvic region: Secondary | ICD-10-CM | POA: Diagnosis not present

## 2016-07-15 DIAGNOSIS — M9903 Segmental and somatic dysfunction of lumbar region: Secondary | ICD-10-CM | POA: Diagnosis not present

## 2016-07-15 DIAGNOSIS — M5137 Other intervertebral disc degeneration, lumbosacral region: Secondary | ICD-10-CM | POA: Diagnosis not present

## 2016-07-16 ENCOUNTER — Telehealth: Payer: Self-pay | Admitting: Family Medicine

## 2016-07-16 DIAGNOSIS — M9903 Segmental and somatic dysfunction of lumbar region: Secondary | ICD-10-CM | POA: Diagnosis not present

## 2016-07-16 DIAGNOSIS — M5137 Other intervertebral disc degeneration, lumbosacral region: Secondary | ICD-10-CM | POA: Diagnosis not present

## 2016-07-16 DIAGNOSIS — M25551 Pain in right hip: Secondary | ICD-10-CM | POA: Diagnosis not present

## 2016-07-16 DIAGNOSIS — M9905 Segmental and somatic dysfunction of pelvic region: Secondary | ICD-10-CM | POA: Diagnosis not present

## 2016-07-16 NOTE — Telephone Encounter (Signed)
Pt request refill  FLUoxetine (PROZAC) 40 MG capsule  clonazePAM (KLONOPIN) 1 MG tablet  [id number] - [place], [place] - [address]. AT [address]  30 day with refills  Pt will be traveling and needs refills asap.  thanks

## 2016-07-19 DIAGNOSIS — M9905 Segmental and somatic dysfunction of pelvic region: Secondary | ICD-10-CM | POA: Diagnosis not present

## 2016-07-19 DIAGNOSIS — M9903 Segmental and somatic dysfunction of lumbar region: Secondary | ICD-10-CM | POA: Diagnosis not present

## 2016-07-19 DIAGNOSIS — M25551 Pain in right hip: Secondary | ICD-10-CM | POA: Diagnosis not present

## 2016-07-19 DIAGNOSIS — M5137 Other intervertebral disc degeneration, lumbosacral region: Secondary | ICD-10-CM | POA: Diagnosis not present

## 2016-07-19 MED ORDER — FLUOXETINE HCL 40 MG PO CAPS: 40.0000 mg | ORAL_CAPSULE | Freq: Every day | ORAL | 11 refills | Status: DC

## 2016-07-19 NOTE — Telephone Encounter (Signed)
Medication sent in for patient. 

## 2016-07-21 DIAGNOSIS — M5137 Other intervertebral disc degeneration, lumbosacral region: Secondary | ICD-10-CM | POA: Diagnosis not present

## 2016-07-21 DIAGNOSIS — M9903 Segmental and somatic dysfunction of lumbar region: Secondary | ICD-10-CM | POA: Diagnosis not present

## 2016-07-21 DIAGNOSIS — M9905 Segmental and somatic dysfunction of pelvic region: Secondary | ICD-10-CM | POA: Diagnosis not present

## 2016-07-21 DIAGNOSIS — M25551 Pain in right hip: Secondary | ICD-10-CM | POA: Diagnosis not present

## 2016-07-23 DIAGNOSIS — M5137 Other intervertebral disc degeneration, lumbosacral region: Secondary | ICD-10-CM | POA: Diagnosis not present

## 2016-07-23 DIAGNOSIS — M9903 Segmental and somatic dysfunction of lumbar region: Secondary | ICD-10-CM | POA: Diagnosis not present

## 2016-07-23 DIAGNOSIS — M25551 Pain in right hip: Secondary | ICD-10-CM | POA: Diagnosis not present

## 2016-07-23 DIAGNOSIS — M9905 Segmental and somatic dysfunction of pelvic region: Secondary | ICD-10-CM | POA: Diagnosis not present

## 2016-07-26 DIAGNOSIS — M25551 Pain in right hip: Secondary | ICD-10-CM | POA: Diagnosis not present

## 2016-07-26 DIAGNOSIS — M5137 Other intervertebral disc degeneration, lumbosacral region: Secondary | ICD-10-CM | POA: Diagnosis not present

## 2016-07-26 DIAGNOSIS — M9905 Segmental and somatic dysfunction of pelvic region: Secondary | ICD-10-CM | POA: Diagnosis not present

## 2016-07-26 DIAGNOSIS — M9903 Segmental and somatic dysfunction of lumbar region: Secondary | ICD-10-CM | POA: Diagnosis not present

## 2016-07-27 DIAGNOSIS — M25551 Pain in right hip: Secondary | ICD-10-CM | POA: Diagnosis not present

## 2016-07-27 DIAGNOSIS — M9905 Segmental and somatic dysfunction of pelvic region: Secondary | ICD-10-CM | POA: Diagnosis not present

## 2016-07-27 DIAGNOSIS — M9903 Segmental and somatic dysfunction of lumbar region: Secondary | ICD-10-CM | POA: Diagnosis not present

## 2016-07-27 DIAGNOSIS — M5137 Other intervertebral disc degeneration, lumbosacral region: Secondary | ICD-10-CM | POA: Diagnosis not present

## 2016-07-28 DIAGNOSIS — M9903 Segmental and somatic dysfunction of lumbar region: Secondary | ICD-10-CM | POA: Diagnosis not present

## 2016-07-28 DIAGNOSIS — M25551 Pain in right hip: Secondary | ICD-10-CM | POA: Diagnosis not present

## 2016-07-28 DIAGNOSIS — M5137 Other intervertebral disc degeneration, lumbosacral region: Secondary | ICD-10-CM | POA: Diagnosis not present

## 2016-07-28 DIAGNOSIS — M9905 Segmental and somatic dysfunction of pelvic region: Secondary | ICD-10-CM | POA: Diagnosis not present

## 2016-08-09 ENCOUNTER — Ambulatory Visit (INDEPENDENT_AMBULATORY_CARE_PROVIDER_SITE_OTHER): Payer: BLUE CROSS/BLUE SHIELD | Admitting: Family Medicine

## 2016-08-09 ENCOUNTER — Ambulatory Visit: Payer: BLUE CROSS/BLUE SHIELD | Admitting: Family Medicine

## 2016-08-09 VITALS — BP 110/80 | HR 72 | Temp 97.5°F | Wt 152.8 lb

## 2016-08-09 DIAGNOSIS — F4320 Adjustment disorder, unspecified: Secondary | ICD-10-CM | POA: Diagnosis not present

## 2016-08-09 DIAGNOSIS — F4310 Post-traumatic stress disorder, unspecified: Secondary | ICD-10-CM

## 2016-08-09 NOTE — Progress Notes (Signed)
Pre visit review using our clinic review tool, if applicable. No additional management support is needed unless otherwise documented below in the visit note. 
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
## 2016-08-09 NOTE — Patient Instructions (Signed)
We will set up referral to [facility]

## 2016-08-11 ENCOUNTER — Encounter: Payer: Self-pay | Admitting: Family Medicine

## 2016-08-11 ENCOUNTER — Ambulatory Visit (INDEPENDENT_AMBULATORY_CARE_PROVIDER_SITE_OTHER): Payer: BLUE CROSS/BLUE SHIELD | Admitting: Family Medicine

## 2016-08-11 VITALS — BP 110/80 | HR 73 | Temp 97.8°F | Wt 154.7 lb

## 2016-08-11 DIAGNOSIS — H8112 Benign paroxysmal vertigo, left ear: Secondary | ICD-10-CM | POA: Diagnosis not present

## 2016-08-11 NOTE — Progress Notes (Signed)
Pre visit review using our clinic review tool, if applicable. No additional management support is needed unless otherwise documented below in the visit note. 

## 2016-08-11 NOTE — Progress Notes (Signed)
Subjective:     Patient ID: Gabriela Shepard, female   DOB: 03/22/1961, 56 y.o.   MRN: [id number]  HPI Patient seen with vertigo symptoms. She noticed this morning before she got up when she moved her head to the left she had symptoms of vertigo and mild nausea but no vomiting. No headache. Denies any speech changes or swallowing difficulties. No fevers or chills. No nasal congestion. No prior history of vertigo. No visual changes. No focal weakness. No difficulties with gait.  Past Medical History:  Diagnosis Date  . Arthritis   . Depression   . Headache   . 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 History of UTI   . PONV (postoperative nausea and vomiting)   . Urine incontinence    Past Surgical History:  Procedure Laterality Date  . CESAREAN SECTION  1992  . INCONTINENCE SURGERY    . LAPAROSCOPY     under bladder  . 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 Her smoking use included Cigarettes. She has a 5.00 pack-year smoking history. She has never used smokeless tobacco. She reports that she does not drink alcohol or use drugs. family history includes Alcohol abuse in her other; Arthritis in her other; Hypertension in her mother and other; Mental illness in her other. Allergies  Allergen Reactions  . Sulfa Antibiotics Hives     Review of Systems  Constitutional: Negative for appetite change and fever.  Genitourinary: Negative for dysuria.  Neurological: Positive for dizziness. Negative for speech difficulty and headaches.  Psychiatric/Behavioral: Negative for confusion.       Objective:   Physical Exam  Constitutional: She is oriented to person, place, and time. She appears well-developed and well-nourished.  Neck: Neck supple.  Cardiovascular: Normal rate and regular rhythm.    Pulmonary/Chest: Effort normal and breath sounds normal. No respiratory distress. She has no wheezes. She has no rales.  Neurological: She is alert and oriented to person, place, and time. No cranial nerve deficit. Coordination normal.  Patient has reproducible vertigo symptoms when lying supine with her head turned to the left but not the right       Assessment:     Vertigo. Suspect benign peripheral positional vertigo to the left    Plan:     -Handout on Epley maneuvers given -Touch base if symptoms not resolving with Epley maneuvers as above and sooner for any new symptoms  Eulas Post MD [facility] at [facility]

## 2016-08-11 NOTE — Patient Instructions (Signed)

## 2016-08-23 DIAGNOSIS — F410 Panic disorder [episodic paroxysmal anxiety] without agoraphobia: Secondary | ICD-10-CM | POA: Diagnosis not present

## 2016-08-23 DIAGNOSIS — F339 Major depressive disorder, recurrent, unspecified: Secondary | ICD-10-CM | POA: Diagnosis not present

## 2016-08-27 DIAGNOSIS — F339 Major depressive disorder, recurrent, unspecified: Secondary | ICD-10-CM | POA: Diagnosis not present

## 2016-08-27 DIAGNOSIS — F410 Panic disorder [episodic paroxysmal anxiety] without agoraphobia: Secondary | ICD-10-CM | POA: Diagnosis not present

## 2016-08-30 DIAGNOSIS — F410 Panic disorder [episodic paroxysmal anxiety] without agoraphobia: Secondary | ICD-10-CM | POA: Diagnosis not present

## 2016-08-30 DIAGNOSIS — F339 Major depressive disorder, recurrent, unspecified: Secondary | ICD-10-CM | POA: Diagnosis not present

## 2016-08-31 ENCOUNTER — Encounter: Payer: Self-pay | Admitting: Neurology

## 2016-08-31 ENCOUNTER — Ambulatory Visit (INDEPENDENT_AMBULATORY_CARE_PROVIDER_SITE_OTHER): Payer: BLUE CROSS/BLUE SHIELD | Admitting: Neurology

## 2016-08-31 ENCOUNTER — Telehealth: Payer: Self-pay | Admitting: Neurology

## 2016-08-31 VITALS — BP 100/68 | HR 74 | Ht 64.0 in | Wt 153.0 lb

## 2016-08-31 DIAGNOSIS — D320 Benign neoplasm of cerebral meninges: Secondary | ICD-10-CM | POA: Diagnosis not present

## 2016-08-31 DIAGNOSIS — G43009 Migraine without aura, not intractable, without status migrainosus: Secondary | ICD-10-CM

## 2016-08-31 MED ORDER — SUMATRIPTAN SUCCINATE 100 MG PO TABS: ORAL_TABLET | ORAL | 4 refills | Status: DC

## 2016-08-31 NOTE — Telephone Encounter (Signed)
Called patient's pharmacy and they state no problem covering sumatriptan 9 tablets a month. RX ready for pick up.

## 2016-08-31 NOTE — Patient Instructions (Signed)
Migraine Recommendations: 1.  Ask the psychiatrist about starting venlafaxine XR (Effexor).  I usually start at 75mg  daily. 2.  Take sumatriptan 100mg  at earliest onset of headache.  May repeat dose once in 2 hours if needed.  Do not exceed two tablets in 24 hours. 3.  Limit use of pain relievers to no more than 2 days out of the week.  These medications include acetaminophen, ibuprofen, triptans and narcotics.  This will help reduce risk of rebound headaches. 4.  Be aware of common food triggers such as processed sweets, processed foods with nitrites (such as deli meat, hot dogs, sausages), foods with MSG, alcohol (such as wine), chocolate, certain cheeses, certain fruits (dried fruits, some citrus fruit), vinegar, diet soda. 4.  Avoid caffeine 5.  Routine exercise 6.  Proper sleep hygiene 7.  Stay adequately hydrated with water 8.  Keep a headache diary. 9.  Maintain proper stress management. 10.  Do not skip meals. 11.  Consider supplements:  Magnesium citrate 400mg  to 600mg  daily, riboflavin 400mg , Coenzyme Q 10 100mg  three times daily 12.  After starting Effexor, contact me in 4 to 6 weeks and we can increase dose if needed. 13.  Repeat MRI of brain with and without contrast in one year 14.  Follow up in 3 months

## 2016-08-31 NOTE — Addendum Note (Signed)
Addended byAnnamaria Helling on: 08/31/2016 10:13 AM   Modules accepted: Orders

## 2016-08-31 NOTE — Progress Notes (Signed)
NEUROLOGY CONSULTATION NOTE  ADASIA HOAR MRN: [id number] DOB: 11/08/1960  Referring provider: Dr. Elease Hashimoto Primary care provider: Dr. Elease Hashimoto  Reason for consult:  migraine  HISTORY OF PRESENT ILLNESS: Gabriela Shepard is a 56 year old female with depression, arthritis and history of alcohol abuse who presents for migraines.  History supplemented by PCP notes.  Onset:  Many years but she was only diagnosed in 2010. Location:  Unilateral (usually left-sided) Quality:  stabbing Intensity:  7-10/10 Aura:  no Prodrome:  no Associated symptoms:  Photophobia, phonophobia, blurred vision, sometimes nausea.  She has not had any new worse headache of her life, waking up from sleep Duration:  2 to 3 hours with sumatriptan (does not take at earliest onset as she is afraid of using them up.  She is only allotted 6 pills a month).  Otherwise, all day Frequency:  10 to 12 days a month Frequency of abortive medication: infrequent Triggers/exacerbating factors:  no Relieving factors:  Pressing onto her head, sleep, ice Activity:  Lays down.  Past NSAIDS:  meloxicam (for arthritis), naproxen 220mg  Past analgesics:  Tylenol, Excedrin Past abortive triptans:  Eletriptan, rizatriptan Past muscle relaxants:  cyclobenzaprine, baclofen, methocarbamol Past anti-emetic:  no Past antihypertensive medications:  no Past antidepressant medications:  no Past anticonvulsant medications:  topiramate, zonisamide Past vitamins/Herbal/Supplements:  no Other past therapies:  Trigger point injections, chiropractic  Current NSAIDS:  Ibuprofen (rarely) Current analgesics:  no Current triptans:  sumatriptan 100mg , rizatriptan 10mg  Current anti-emetic:  promethazine Current muscle relaxants:  baclofen Current anti-anxiolytic:  clonazepam 1mg  Current sleep aide:  clonazepam Current Antihypertensive medications:  no Current Antidepressant medications:  bupropion, fluoxetine 40mg  Current  Anticonvulsant medications: no Current Vitamins/Herbal/Supplements:  magnesium Current Antihistamines/Decongestants:  levocetirizine Other therapy:  no  Caffeine:  Coffee, little tea and diet soda Alcohol:  no Smoker:  no Diet:  Drinks water. Exercise:  Limited due to knee replacement Depression/anxiety:  Yes.  Has upcoming appointment with psychiatry Sleep hygiene:  Good with clonazepam Family history of headache:  Sister, aunt  MRI brain without contrast performed on 06/22/16 was personally reviewed and revealed chronic small vessel ischemic changes and a 4 mm dural lesion at left vertex, likely a small meningioma. 04/14/16 Sed Rate 19  PAST MEDICAL HISTORY: Past Medical History:  Diagnosis Date  . Arthritis   . Depression   . Headache   . 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 History of UTI   . PONV (postoperative nausea and vomiting)   . Urine incontinence     PAST SURGICAL HISTORY: Past Surgical History:  Procedure Laterality Date  . CESAREAN SECTION  1992  . INCONTINENCE SURGERY    . LAPAROSCOPY     under bladder  . 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 baclofen (LIORESAL) 10 MG tablet Take 10 mg by mouth. Take 1/2 to 1 tablet up to 2x daily. Limit 1-2 treatments/week.    Marland Kitchen buPROPion (WELLBUTRIN XL) 300 MG 24 hr tablet TAKE 1 TABLET BY MOUTH EVERY DAY 30 tablet 5  . clonazePAM (KLONOPIN) 1 MG tablet TAKE 1 TABLET 3 TIMES A DAY AS NEEDED USE SPARINGLY 90 tablet 0  . FLUoxetine (PROZAC) 40 MG capsule Take 1 capsule (40 mg total)  by mouth daily. 30 capsule 11  . hyoscyamine (LEVBID) 0.375 MG 12 hr tablet TAKE 1 TABLET BY MOUTH TWICE A DAY 60 tablet 0  . levocetirizine (XYZAL) 5 MG tablet Take 1 tablet (5 mg total)  by mouth every evening. 90 tablet 2  . MINIVELLE 0.0375 MG/24HR APPLY 1 PATCH TWICE WEEKLY AS DIRECTED 8 patch 11  . progesterone (PROMETRIUM) 100 MG capsule Take 100 mg by mouth daily.   10  . SUMAtriptan (IMITREX) 100 MG tablet TAKE 1 TABLET EVERY 2 HOURS AS NEEDED FOR MIGRAINE 6 tablet 4  . valACYclovir (VALTREX) 1000 MG tablet Take 1,000 mg by mouth as needed (outbreak).   2   No current facility-administered medications on file prior to visit.     ALLERGIES: Allergies  Allergen Reactions  . Sulfa Antibiotics Hives    FAMILY HISTORY: Family History  Problem Relation Age of Onset  . Hypertension Mother   . Alcohol abuse Other   . Arthritis Other   . Hypertension Other   . Mental illness Other   . Colon cancer Neg Hx     SOCIAL HISTORY: Social History   Social History  . Marital status: Divorced    Spouse name: N/A  . Number of children: N/A  . Years of education: N/A   Occupational History  . Not on file.   Social History Main Topics  . Smoking status: Former Smoker    Packs/day: 0.50    Years: 10.00    Types: Cigarettes    Quit date: 11/13/2010  . Smokeless tobacco: Never Used  . Alcohol use No  . Drug use: No  . Sexual activity: Not on file   Other Topics Concern  . Not on file   Social History Narrative  . No narrative on file    REVIEW OF SYSTEMS: Constitutional: No fevers, chills, or sweats, no generalized fatigue, change in appetite Eyes: No visual changes, double vision, eye pain Ear, nose and throat: No hearing loss, ear pain, nasal congestion, sore throat Cardiovascular: No chest pain, palpitations Respiratory:  No shortness of breath at rest or with exertion, wheezes GastrointestinaI: No nausea, vomiting, diarrhea, abdominal pain, fecal incontinence Genitourinary:  No dysuria, urinary retention or frequency Musculoskeletal:  No neck pain, back pain Integumentary: No rash, pruritus, skin lesions Neurological: as above Psychiatric: No  depression, insomnia, anxiety Endocrine: No palpitations, fatigue, diaphoresis, mood swings, change in appetite, change in weight, increased thirst Hematologic/Lymphatic:  No purpura, petechiae. Allergic/Immunologic: no itchy/runny eyes, nasal congestion, recent allergic reactions, rashes  PHYSICAL EXAM: Vitals:   08/31/16 0806  BP: 100/68  Pulse: 74   General: No acute distress.  Patient appears well-groomed.  Head:  Normocephalic/atraumatic Eyes:  fundi examined but not visualized Neck: supple, no paraspinal tenderness, full range of motion Back: No paraspinal tenderness Heart: regular rate and rhythm Lungs: Clear to auscultation bilaterally. Vascular: No carotid bruits. Neurological Exam: Mental status: alert and oriented to person, place, and time, recent and remote memory intact, fund of knowledge intact, attention and concentration intact, speech fluent and not dysarthric, language intact. Cranial nerves: CN I: not tested CN II: pupils equal, round and reactive to light, visual fields intact CN III, IV, VI:  full range of motion, no nystagmus, no ptosis CN V: facial sensation intact CN VII: upper and lower face symmetric CN VIII: hearing intact CN IX, X: gag intact, uvula midline CN XI: sternocleidomastoid and trapezius muscles intact CN XII: tongue midline Bulk & Tone: normal, no fasciculations. Motor:  5/5 throughout  Sensation: temperature and vibration sensation intact. . Deep Tendon Reflexes:  2+ throughout, toes downgoing.  Finger to nose testing:  Without dysmetria.  Heel to shin:  Without dysmetria.  Gait:  Normal station and stride.  Able to turn and tandem walk. Romberg negative.  IMPRESSION: Migraine Cerebral aneurysm  PLAN: 1.  Ask the psychiatrist about starting venlafaxine XR (Effexor).  I usually start at 75mg  daily. 2.  Take sumatriptan 100mg  at earliest onset of headache.  May repeat dose once in 2 hours if needed.  Do not exceed two tablets in 24  hours. 3.  Limit use of pain relievers to no more than 2 days out of the week.  These medications include acetaminophen, ibuprofen, triptans and narcotics.  This will help reduce risk of rebound headaches. 4.  Be aware of common food triggers such as processed sweets, processed foods with nitrites (such as deli meat, hot dogs, sausages), foods with MSG, alcohol (such as wine), chocolate, certain cheeses, certain fruits (dried fruits, some citrus fruit), vinegar, diet soda. 4.  Avoid caffeine 5.  Routine exercise 6.  Proper sleep hygiene 7.  Stay adequately hydrated with water 8.  Keep a headache diary. 9.  Maintain proper stress management. 10.  Do not skip meals. 11.  Consider supplements:  Magnesium citrate 400mg  to 600mg  daily, riboflavin 400mg , Coenzyme Q 10 100mg  three times daily 12.  After starting Effexor, contact me in 4 to 6 weeks and we can increase dose if needed. 13.  Repeat MRI of brain with and without contrast in one year 14.  Follow up in 3 months  Thank you for allowing me to take part in the care of this patient.  Metta Clines, DO  CC:  Carolann Littler, MD

## 2016-09-07 DIAGNOSIS — F4310 Post-traumatic stress disorder, unspecified: Secondary | ICD-10-CM | POA: Diagnosis not present

## 2016-09-07 DIAGNOSIS — F3340 Major depressive disorder, recurrent, in remission, unspecified: Secondary | ICD-10-CM | POA: Diagnosis not present

## 2016-09-07 DIAGNOSIS — F411 Generalized anxiety disorder: Secondary | ICD-10-CM | POA: Diagnosis not present

## 2016-09-08 DIAGNOSIS — F411 Generalized anxiety disorder: Secondary | ICD-10-CM | POA: Diagnosis not present

## 2016-09-08 DIAGNOSIS — F4310 Post-traumatic stress disorder, unspecified: Secondary | ICD-10-CM | POA: Diagnosis not present

## 2016-09-08 DIAGNOSIS — F3340 Major depressive disorder, recurrent, in remission, unspecified: Secondary | ICD-10-CM | POA: Diagnosis not present

## 2016-09-13 DIAGNOSIS — F4310 Post-traumatic stress disorder, unspecified: Secondary | ICD-10-CM | POA: Diagnosis not present

## 2016-09-13 DIAGNOSIS — F411 Generalized anxiety disorder: Secondary | ICD-10-CM | POA: Diagnosis not present

## 2016-09-13 DIAGNOSIS — F3340 Major depressive disorder, recurrent, in remission, unspecified: Secondary | ICD-10-CM | POA: Diagnosis not present

## 2016-09-16 DIAGNOSIS — F4310 Post-traumatic stress disorder, unspecified: Secondary | ICD-10-CM | POA: Diagnosis not present

## 2016-09-16 DIAGNOSIS — F411 Generalized anxiety disorder: Secondary | ICD-10-CM | POA: Diagnosis not present

## 2016-09-16 DIAGNOSIS — F3340 Major depressive disorder, recurrent, in remission, unspecified: Secondary | ICD-10-CM | POA: Diagnosis not present

## 2016-09-20 DIAGNOSIS — F3340 Major depressive disorder, recurrent, in remission, unspecified: Secondary | ICD-10-CM | POA: Diagnosis not present

## 2016-09-20 DIAGNOSIS — F411 Generalized anxiety disorder: Secondary | ICD-10-CM | POA: Diagnosis not present

## 2016-09-20 DIAGNOSIS — F4310 Post-traumatic stress disorder, unspecified: Secondary | ICD-10-CM | POA: Diagnosis not present

## 2016-09-21 ENCOUNTER — Other Ambulatory Visit: Payer: Self-pay | Admitting: Family Medicine

## 2016-09-21 ENCOUNTER — Telehealth: Payer: Self-pay | Admitting: Gastroenterology

## 2016-09-21 DIAGNOSIS — Z1231 Encounter for screening mammogram for malignant neoplasm of breast: Secondary | ICD-10-CM

## 2016-09-21 NOTE — Telephone Encounter (Signed)
Dr Jacobs is this ok? 

## 2016-09-21 NOTE — Telephone Encounter (Signed)
Patient is over due for colon and wants to schedule but would like to switch doctors. Patient currently sees Dr.Jacobs but due to her previous experience with him she states she would like to see a different doctor. DOD is Dr.Pyrtle. Is it okay to sch with Dr.Pyrtle?

## 2016-09-21 NOTE — Telephone Encounter (Signed)
I reviewed her colonoscopy report from 2013; things seemed to have gone very smoothly and I see no charted calls that she had any issues.  Can you ask what was it about the experience that she would like to see a different MD?  But certainly whatever the reason it is fine with me is she transfers her care.

## 2016-09-22 DIAGNOSIS — M545 Low back pain: Secondary | ICD-10-CM | POA: Diagnosis not present

## 2016-09-22 DIAGNOSIS — Z96652 Presence of left artificial knee joint: Secondary | ICD-10-CM | POA: Diagnosis not present

## 2016-09-22 DIAGNOSIS — M25562 Pain in left knee: Secondary | ICD-10-CM | POA: Diagnosis not present

## 2016-09-22 NOTE — Telephone Encounter (Signed)
Vaughan Basta see the note regarding transfer of care to Dr Hilarie Fredrickson

## 2016-09-22 NOTE — Telephone Encounter (Signed)
ok 

## 2016-09-22 NOTE — Telephone Encounter (Signed)
Pt states that after her procedure she talked to Dr Ardis Hughs about a migraine she had and "he just blew me off"  She states he is very arrogant and will not see him again.  Dr Hilarie Fredrickson will you accept?

## 2016-09-22 NOTE — Telephone Encounter (Signed)
Dr. Hilarie Fredrickson please see the note below, will you accept this pt?

## 2016-09-22 NOTE — Telephone Encounter (Signed)
OK with Dr. Hilarie Fredrickson to schedule her with him.

## 2016-09-23 DIAGNOSIS — F411 Generalized anxiety disorder: Secondary | ICD-10-CM | POA: Diagnosis not present

## 2016-09-23 DIAGNOSIS — F4310 Post-traumatic stress disorder, unspecified: Secondary | ICD-10-CM | POA: Diagnosis not present

## 2016-09-23 DIAGNOSIS — F3340 Major depressive disorder, recurrent, in remission, unspecified: Secondary | ICD-10-CM | POA: Diagnosis not present

## 2016-09-24 NOTE — Telephone Encounter (Signed)
Left message for pt to call back and schedule recall colon with Dr.Pyrtle.

## 2016-09-27 ENCOUNTER — Other Ambulatory Visit: Payer: Self-pay | Admitting: Family Medicine

## 2016-09-27 DIAGNOSIS — F3340 Major depressive disorder, recurrent, in remission, unspecified: Secondary | ICD-10-CM | POA: Diagnosis not present

## 2016-09-27 DIAGNOSIS — F4310 Post-traumatic stress disorder, unspecified: Secondary | ICD-10-CM | POA: Diagnosis not present

## 2016-09-27 DIAGNOSIS — F411 Generalized anxiety disorder: Secondary | ICD-10-CM | POA: Diagnosis not present

## 2016-09-27 NOTE — Telephone Encounter (Signed)
Pt need new Rx for sumatriptan  Pharm:  [facility]  Pt is going out of town and would like to have today if possible.

## 2016-10-01 ENCOUNTER — Other Ambulatory Visit: Payer: Self-pay | Admitting: Family Medicine

## 2016-10-04 DIAGNOSIS — F4310 Post-traumatic stress disorder, unspecified: Secondary | ICD-10-CM | POA: Diagnosis not present

## 2016-10-04 DIAGNOSIS — F411 Generalized anxiety disorder: Secondary | ICD-10-CM | POA: Diagnosis not present

## 2016-10-04 DIAGNOSIS — F3340 Major depressive disorder, recurrent, in remission, unspecified: Secondary | ICD-10-CM | POA: Diagnosis not present

## 2016-10-05 DIAGNOSIS — F3340 Major depressive disorder, recurrent, in remission, unspecified: Secondary | ICD-10-CM | POA: Diagnosis not present

## 2016-10-05 DIAGNOSIS — F411 Generalized anxiety disorder: Secondary | ICD-10-CM | POA: Diagnosis not present

## 2016-10-05 DIAGNOSIS — F4310 Post-traumatic stress disorder, unspecified: Secondary | ICD-10-CM | POA: Diagnosis not present

## 2016-10-06 DIAGNOSIS — F4310 Post-traumatic stress disorder, unspecified: Secondary | ICD-10-CM | POA: Diagnosis not present

## 2016-10-06 DIAGNOSIS — M6281 Muscle weakness (generalized): Secondary | ICD-10-CM | POA: Diagnosis not present

## 2016-10-06 DIAGNOSIS — F3340 Major depressive disorder, recurrent, in remission, unspecified: Secondary | ICD-10-CM | POA: Diagnosis not present

## 2016-10-06 DIAGNOSIS — M545 Low back pain: Secondary | ICD-10-CM | POA: Diagnosis not present

## 2016-10-06 DIAGNOSIS — F411 Generalized anxiety disorder: Secondary | ICD-10-CM | POA: Diagnosis not present

## 2016-10-11 DIAGNOSIS — F3340 Major depressive disorder, recurrent, in remission, unspecified: Secondary | ICD-10-CM | POA: Diagnosis not present

## 2016-10-11 DIAGNOSIS — F4310 Post-traumatic stress disorder, unspecified: Secondary | ICD-10-CM | POA: Diagnosis not present

## 2016-10-11 DIAGNOSIS — F411 Generalized anxiety disorder: Secondary | ICD-10-CM | POA: Diagnosis not present

## 2016-10-13 DIAGNOSIS — M545 Low back pain: Secondary | ICD-10-CM | POA: Diagnosis not present

## 2016-10-13 DIAGNOSIS — M6281 Muscle weakness (generalized): Secondary | ICD-10-CM | POA: Diagnosis not present

## 2016-10-14 ENCOUNTER — Ambulatory Visit
Admission: RE | Admit: 2016-10-14 | Discharge: 2016-10-14 | Disposition: A | Discharge status: Home / Self Care | Payer: BLUE CROSS/BLUE SHIELD | Source: Ambulatory Visit | Attending: Family Medicine | Admitting: Family Medicine

## 2016-10-14 DIAGNOSIS — Z1231 Encounter for screening mammogram for malignant neoplasm of breast: Secondary | ICD-10-CM | POA: Diagnosis not present

## 2016-10-14 DIAGNOSIS — M545 Low back pain: Secondary | ICD-10-CM | POA: Diagnosis not present

## 2016-10-14 DIAGNOSIS — M6281 Muscle weakness (generalized): Secondary | ICD-10-CM | POA: Diagnosis not present

## 2016-10-18 DIAGNOSIS — M5137 Other intervertebral disc degeneration, lumbosacral region: Secondary | ICD-10-CM | POA: Diagnosis not present

## 2016-10-18 DIAGNOSIS — M9905 Segmental and somatic dysfunction of pelvic region: Secondary | ICD-10-CM | POA: Diagnosis not present

## 2016-10-18 DIAGNOSIS — M545 Low back pain: Secondary | ICD-10-CM | POA: Diagnosis not present

## 2016-10-18 DIAGNOSIS — M25551 Pain in right hip: Secondary | ICD-10-CM | POA: Diagnosis not present

## 2016-10-18 DIAGNOSIS — M9903 Segmental and somatic dysfunction of lumbar region: Secondary | ICD-10-CM | POA: Diagnosis not present

## 2016-10-18 DIAGNOSIS — M6281 Muscle weakness (generalized): Secondary | ICD-10-CM | POA: Diagnosis not present

## 2016-10-19 DIAGNOSIS — F3340 Major depressive disorder, recurrent, in remission, unspecified: Secondary | ICD-10-CM | POA: Diagnosis not present

## 2016-10-19 DIAGNOSIS — F411 Generalized anxiety disorder: Secondary | ICD-10-CM | POA: Diagnosis not present

## 2016-10-19 DIAGNOSIS — F4310 Post-traumatic stress disorder, unspecified: Secondary | ICD-10-CM | POA: Diagnosis not present

## 2016-10-20 DIAGNOSIS — M545 Low back pain: Secondary | ICD-10-CM | POA: Diagnosis not present

## 2016-10-20 DIAGNOSIS — M6281 Muscle weakness (generalized): Secondary | ICD-10-CM | POA: Diagnosis not present

## 2016-10-20 DIAGNOSIS — F4310 Post-traumatic stress disorder, unspecified: Secondary | ICD-10-CM | POA: Diagnosis not present

## 2016-10-20 DIAGNOSIS — F3340 Major depressive disorder, recurrent, in remission, unspecified: Secondary | ICD-10-CM | POA: Diagnosis not present

## 2016-10-20 DIAGNOSIS — F411 Generalized anxiety disorder: Secondary | ICD-10-CM | POA: Diagnosis not present

## 2016-10-21 DIAGNOSIS — M5137 Other intervertebral disc degeneration, lumbosacral region: Secondary | ICD-10-CM | POA: Diagnosis not present

## 2016-10-21 DIAGNOSIS — M9905 Segmental and somatic dysfunction of pelvic region: Secondary | ICD-10-CM | POA: Diagnosis not present

## 2016-10-21 DIAGNOSIS — M25551 Pain in right hip: Secondary | ICD-10-CM | POA: Diagnosis not present

## 2016-10-21 DIAGNOSIS — M9903 Segmental and somatic dysfunction of lumbar region: Secondary | ICD-10-CM | POA: Diagnosis not present

## 2016-10-22 DIAGNOSIS — M545 Low back pain: Secondary | ICD-10-CM | POA: Diagnosis not present

## 2016-10-22 DIAGNOSIS — M6281 Muscle weakness (generalized): Secondary | ICD-10-CM | POA: Diagnosis not present

## 2016-10-25 DIAGNOSIS — M25551 Pain in right hip: Secondary | ICD-10-CM | POA: Diagnosis not present

## 2016-10-25 DIAGNOSIS — M9905 Segmental and somatic dysfunction of pelvic region: Secondary | ICD-10-CM | POA: Diagnosis not present

## 2016-10-25 DIAGNOSIS — M5137 Other intervertebral disc degeneration, lumbosacral region: Secondary | ICD-10-CM | POA: Diagnosis not present

## 2016-10-25 DIAGNOSIS — M9903 Segmental and somatic dysfunction of lumbar region: Secondary | ICD-10-CM | POA: Diagnosis not present

## 2016-10-26 ENCOUNTER — Telehealth: Payer: Self-pay | Admitting: Family Medicine

## 2016-10-26 DIAGNOSIS — M6281 Muscle weakness (generalized): Secondary | ICD-10-CM | POA: Diagnosis not present

## 2016-10-26 DIAGNOSIS — G43909 Migraine, unspecified, not intractable, without status migrainosus: Secondary | ICD-10-CM

## 2016-10-26 DIAGNOSIS — M545 Low back pain: Secondary | ICD-10-CM | POA: Diagnosis not present

## 2016-10-26 NOTE — Telephone Encounter (Signed)
Pt would like to have a referral for her migraines to [facility] in [place] w/Dr. Angela Nevin w/([facility]) [phone]  Pt state that she has gone to several different doctors and she was told about the doctor by her PT.

## 2016-10-26 NOTE — Telephone Encounter (Signed)
OK to refer.

## 2016-10-26 NOTE — Telephone Encounter (Signed)
Referral placed.

## 2016-10-27 DIAGNOSIS — F4310 Post-traumatic stress disorder, unspecified: Secondary | ICD-10-CM | POA: Diagnosis not present

## 2016-10-27 DIAGNOSIS — F3340 Major depressive disorder, recurrent, in remission, unspecified: Secondary | ICD-10-CM | POA: Diagnosis not present

## 2016-10-27 DIAGNOSIS — M6281 Muscle weakness (generalized): Secondary | ICD-10-CM | POA: Diagnosis not present

## 2016-10-27 DIAGNOSIS — M545 Low back pain: Secondary | ICD-10-CM | POA: Diagnosis not present

## 2016-10-27 DIAGNOSIS — F411 Generalized anxiety disorder: Secondary | ICD-10-CM | POA: Diagnosis not present

## 2016-10-29 DIAGNOSIS — F4310 Post-traumatic stress disorder, unspecified: Secondary | ICD-10-CM | POA: Diagnosis not present

## 2016-10-29 DIAGNOSIS — F411 Generalized anxiety disorder: Secondary | ICD-10-CM | POA: Diagnosis not present

## 2016-10-29 DIAGNOSIS — F3340 Major depressive disorder, recurrent, in remission, unspecified: Secondary | ICD-10-CM | POA: Diagnosis not present

## 2016-11-02 DIAGNOSIS — M545 Low back pain: Secondary | ICD-10-CM | POA: Diagnosis not present

## 2016-11-02 DIAGNOSIS — M6281 Muscle weakness (generalized): Secondary | ICD-10-CM | POA: Diagnosis not present

## 2016-11-02 DIAGNOSIS — M5137 Other intervertebral disc degeneration, lumbosacral region: Secondary | ICD-10-CM | POA: Diagnosis not present

## 2016-11-02 DIAGNOSIS — M9903 Segmental and somatic dysfunction of lumbar region: Secondary | ICD-10-CM | POA: Diagnosis not present

## 2016-11-02 DIAGNOSIS — M9905 Segmental and somatic dysfunction of pelvic region: Secondary | ICD-10-CM | POA: Diagnosis not present

## 2016-11-02 DIAGNOSIS — M25551 Pain in right hip: Secondary | ICD-10-CM | POA: Diagnosis not present

## 2016-11-03 DIAGNOSIS — F411 Generalized anxiety disorder: Secondary | ICD-10-CM | POA: Diagnosis not present

## 2016-11-03 DIAGNOSIS — F4310 Post-traumatic stress disorder, unspecified: Secondary | ICD-10-CM | POA: Diagnosis not present

## 2016-11-03 DIAGNOSIS — F3340 Major depressive disorder, recurrent, in remission, unspecified: Secondary | ICD-10-CM | POA: Diagnosis not present

## 2016-11-04 DIAGNOSIS — M6281 Muscle weakness (generalized): Secondary | ICD-10-CM | POA: Diagnosis not present

## 2016-11-04 DIAGNOSIS — M25551 Pain in right hip: Secondary | ICD-10-CM | POA: Diagnosis not present

## 2016-11-04 DIAGNOSIS — M9903 Segmental and somatic dysfunction of lumbar region: Secondary | ICD-10-CM | POA: Diagnosis not present

## 2016-11-04 DIAGNOSIS — M9905 Segmental and somatic dysfunction of pelvic region: Secondary | ICD-10-CM | POA: Diagnosis not present

## 2016-11-04 DIAGNOSIS — M5137 Other intervertebral disc degeneration, lumbosacral region: Secondary | ICD-10-CM | POA: Diagnosis not present

## 2016-11-04 DIAGNOSIS — M545 Low back pain: Secondary | ICD-10-CM | POA: Diagnosis not present

## 2016-11-09 DIAGNOSIS — M545 Low back pain: Secondary | ICD-10-CM | POA: Diagnosis not present

## 2016-11-09 DIAGNOSIS — M6281 Muscle weakness (generalized): Secondary | ICD-10-CM | POA: Diagnosis not present

## 2016-11-10 ENCOUNTER — Ambulatory Visit (AMBULATORY_SURGERY_CENTER): Payer: Self-pay | Admitting: *Deleted

## 2016-11-10 ENCOUNTER — Telehealth: Payer: Self-pay | Admitting: *Deleted

## 2016-11-10 VITALS — Ht 64.0 in | Wt 149.0 lb

## 2016-11-10 DIAGNOSIS — F3340 Major depressive disorder, recurrent, in remission, unspecified: Secondary | ICD-10-CM | POA: Diagnosis not present

## 2016-11-10 DIAGNOSIS — Z86010 Personal history of colonic polyps: Secondary | ICD-10-CM

## 2016-11-10 DIAGNOSIS — M6281 Muscle weakness (generalized): Secondary | ICD-10-CM | POA: Diagnosis not present

## 2016-11-10 DIAGNOSIS — F411 Generalized anxiety disorder: Secondary | ICD-10-CM | POA: Diagnosis not present

## 2016-11-10 DIAGNOSIS — F4310 Post-traumatic stress disorder, unspecified: Secondary | ICD-10-CM | POA: Diagnosis not present

## 2016-11-10 DIAGNOSIS — M545 Low back pain: Secondary | ICD-10-CM | POA: Diagnosis not present

## 2016-11-10 MED ORDER — NA SULFATE-K SULFATE-MG SULF 17.5-3.13-1.6 GM/177ML PO SOLN: ORAL | 0 refills | Status: DC

## 2016-11-10 NOTE — Telephone Encounter (Signed)
Spoke with patient. Notified her of Dr.Pyrtle and Jenny Reichmann, CRNA recommendations. Patient was very thankful.

## 2016-11-10 NOTE — Progress Notes (Signed)
Patient denies any allergies to eggs or soy. Patient does have problems with anesthesia/sedation. Patient gets migraines and N&V. Sent note to Dr.Pyrtle. Patient denies any oxygen use at home and does not take any diet/weight loss medications. EMMI education assisgned to patient on colonoscopy, this was explained and instructions given to patient.

## 2016-11-10 NOTE — Telephone Encounter (Signed)
Robbin,  This pt is cleared for anesthetic care at [facility].   Thanks,   Cj Beecher 

## 2016-11-10 NOTE — Telephone Encounter (Signed)
No other recommendations prior to procedure We will also use IV fluids to hydrate the patient which may help prevent migraine With single agent propofol and not general anesthesia I would expect risk of migraine to be smaller

## 2016-11-10 NOTE — Telephone Encounter (Signed)
Patient had pre-visit today. She explained that after last colonoscopy she woke up with a severe migraine and was not treated here. She also gets migraines and N&V after surgeries. Her last sx total knee on 12/23/15 she "woke up" in recovery having a migraine. Explained to her that she should take her Imitrex before coming in, and that we can treat her in recovery. Just wanted to let you all know. Anything else we can do for this patient? Please advise... Thank you, Gabriela Shepard

## 2016-11-12 DIAGNOSIS — M545 Low back pain: Secondary | ICD-10-CM | POA: Diagnosis not present

## 2016-11-12 DIAGNOSIS — M6281 Muscle weakness (generalized): Secondary | ICD-10-CM | POA: Diagnosis not present

## 2016-11-15 DIAGNOSIS — F3340 Major depressive disorder, recurrent, in remission, unspecified: Secondary | ICD-10-CM | POA: Diagnosis not present

## 2016-11-15 DIAGNOSIS — F411 Generalized anxiety disorder: Secondary | ICD-10-CM | POA: Diagnosis not present

## 2016-11-15 DIAGNOSIS — M6281 Muscle weakness (generalized): Secondary | ICD-10-CM | POA: Diagnosis not present

## 2016-11-15 DIAGNOSIS — M533 Sacrococcygeal disorders, not elsewhere classified: Secondary | ICD-10-CM | POA: Diagnosis not present

## 2016-11-15 DIAGNOSIS — M545 Low back pain: Secondary | ICD-10-CM | POA: Diagnosis not present

## 2016-11-15 DIAGNOSIS — F4310 Post-traumatic stress disorder, unspecified: Secondary | ICD-10-CM | POA: Diagnosis not present

## 2016-11-16 ENCOUNTER — Telehealth: Payer: Self-pay | Admitting: Neurology

## 2016-11-16 ENCOUNTER — Telehealth: Payer: Self-pay | Admitting: Gastroenterology

## 2016-11-16 DIAGNOSIS — F411 Generalized anxiety disorder: Secondary | ICD-10-CM | POA: Diagnosis not present

## 2016-11-16 DIAGNOSIS — F4310 Post-traumatic stress disorder, unspecified: Secondary | ICD-10-CM | POA: Diagnosis not present

## 2016-11-16 DIAGNOSIS — M533 Sacrococcygeal disorders, not elsewhere classified: Secondary | ICD-10-CM | POA: Diagnosis not present

## 2016-11-16 DIAGNOSIS — F3340 Major depressive disorder, recurrent, in remission, unspecified: Secondary | ICD-10-CM | POA: Diagnosis not present

## 2016-11-16 MED ORDER — ZOLMITRIPTAN 2.5 MG PO TABS: 2.5000 mg | ORAL_TABLET | Freq: Once | ORAL | 0 refills | Status: DC

## 2016-11-16 NOTE — Telephone Encounter (Signed)
Spoke to pt, she is seeing her psychiatrist today and will speak to her about increasing the dose of Effexor. She also would like to try something other than Imitrex although this is working due to the fact that she can only have a limited supply. I explained to her that most migraine medications will have to be filled this way and that increasing the dose of the preventative drug is what will help in the long run. She verbalized understanding. Zomig was sent to pharmacy and patient will call back with any problems.

## 2016-11-16 NOTE — Telephone Encounter (Signed)
Pt states she is having at least 3 migraines a week and wanted to know if there is something else she can take more on a daily/regular basis. She reports she is now on effexor and has been doing everything you recommended. Please advise.

## 2016-11-16 NOTE — Telephone Encounter (Signed)
Caller: Gabriela Shepard  Urgent? Yes  Reason for the call: She has been getting migraines the past several afternoons. She said maybe the Imitrex dosage  needs to be adjusted or try something different. Please call. Thanks

## 2016-11-16 NOTE — Telephone Encounter (Signed)
Prep 85.00 with coupon, patient cannot afford. Left sample prep at the desk for patient to pick up

## 2016-11-16 NOTE — Telephone Encounter (Signed)
1. If she is on 75mg  daily of venlafaxine, I would increase dose to 150mg  daily to try and reduce frequency of migraine. 2.  The Imitrex is used to break the migraine when she gets it (not as a preventative).  If it is not breaking the headache in a timely manner, then we can try a different medication.  Recommend Zomig 2.5mg  tablet earliest onset of headache and may repeat once after 2 hours if needed.

## 2016-11-17 ENCOUNTER — Ambulatory Visit (INDEPENDENT_AMBULATORY_CARE_PROVIDER_SITE_OTHER): Payer: BLUE CROSS/BLUE SHIELD | Admitting: Family Medicine

## 2016-11-17 ENCOUNTER — Encounter: Payer: Self-pay | Admitting: Family Medicine

## 2016-11-17 VITALS — BP 110/68 | HR 75 | Temp 97.7°F | Wt 151.3 lb

## 2016-11-17 DIAGNOSIS — B349 Viral infection, unspecified: Secondary | ICD-10-CM | POA: Diagnosis not present

## 2016-11-17 MED ORDER — PROMETHAZINE HCL 25 MG PO TABS: 25.0000 mg | ORAL_TABLET | Freq: Three times a day (TID) | ORAL | 1 refills | Status: DC | PRN

## 2016-11-17 NOTE — Progress Notes (Signed)
Subjective:     Patient ID: Gabriela Shepard, female   DOB: Jan 22, 1961, 56 y.o.   MRN: [id number]  HPI   Patient's seen with onset last night of nonspecific achiness, malaise, sore throat, dry cough and minimal nasal congestion. She states she had episode of reflux last night woke up with one episode of vomiting around 2 AM. She's had no vomiting since then. No diarrhea. Mild upper abdominal cramp-like pain. No documented fever. She has had some diffuse headache which is different from her usual migraine. No recent tick bites. No sick contacts.  Past Medical History:  Diagnosis Date  . Arthritis   . Depression   . Headache   . 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 History of UTI   . Migraines   . PONV (postoperative nausea and vomiting)   . PTSD (post-traumatic stress disorder)   . Urine incontinence    Past Surgical History:  Procedure Laterality Date  . BREAST BIOPSY    . CESAREAN SECTION  1992  . COLONOSCOPY    . INCONTINENCE SURGERY    . LAPAROSCOPY     under bladder  . 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 Her smoking use included Cigarettes. She has a 5.00 pack-year smoking history. She has never used smokeless tobacco. She reports that she does not drink alcohol or use drugs. family history includes Alcohol abuse in her other; Arthritis in her other; Hypertension in her mother and other; Kidney cancer in her father; Mental illness in her other. Allergies  Allergen Reactions  . Sulfa Antibiotics Hives     Review of Systems  Constitutional: Positive for fatigue. Negative for chills and fever.  HENT: Positive for congestion and sore throat.   Respiratory: Positive for cough.   Gastrointestinal: Positive for nausea.  Neurological: Positive for headaches.        Objective:   Physical Exam  Constitutional: She appears well-developed and well-nourished.  HENT:  Right Ear: External ear normal.  Left Ear: External ear normal.  Mouth/Throat: Oropharynx is clear and moist.  Neck: Neck supple.  Cardiovascular: Normal rate and regular rhythm.   Pulmonary/Chest: Effort normal and breath sounds normal. No respiratory distress. She has no wheezes. She has no rales.  Lymphadenopathy:    She has no cervical adenopathy.  Skin: No rash noted.       Assessment:     Probable viral syndrome. Nonfocal exam    Plan:     -Stay well-hydrated -Consider Tylenol as needed for sore throat symptoms -Refill her Phenergan which she uses as needed for acute migraine flareup  Eulas Post MD [facility] at [facility]

## 2016-11-18 ENCOUNTER — Encounter: Payer: Self-pay | Admitting: Internal Medicine

## 2016-11-19 DIAGNOSIS — M6281 Muscle weakness (generalized): Secondary | ICD-10-CM | POA: Diagnosis not present

## 2016-11-19 DIAGNOSIS — M545 Low back pain: Secondary | ICD-10-CM | POA: Diagnosis not present

## 2016-11-23 DIAGNOSIS — F3340 Major depressive disorder, recurrent, in remission, unspecified: Secondary | ICD-10-CM | POA: Diagnosis not present

## 2016-11-23 DIAGNOSIS — F411 Generalized anxiety disorder: Secondary | ICD-10-CM | POA: Diagnosis not present

## 2016-11-23 DIAGNOSIS — F4310 Post-traumatic stress disorder, unspecified: Secondary | ICD-10-CM | POA: Diagnosis not present

## 2016-11-24 ENCOUNTER — Encounter: Payer: BLUE CROSS/BLUE SHIELD | Admitting: Gastroenterology

## 2016-11-29 DIAGNOSIS — M6281 Muscle weakness (generalized): Secondary | ICD-10-CM | POA: Diagnosis not present

## 2016-11-29 DIAGNOSIS — M545 Low back pain: Secondary | ICD-10-CM | POA: Diagnosis not present

## 2016-12-01 ENCOUNTER — Encounter: Payer: BLUE CROSS/BLUE SHIELD | Admitting: Internal Medicine

## 2016-12-01 ENCOUNTER — Ambulatory Visit: Payer: BLUE CROSS/BLUE SHIELD | Admitting: Neurology

## 2016-12-03 ENCOUNTER — Telehealth: Payer: Self-pay | Admitting: Neurology

## 2016-12-03 MED ORDER — PREDNISONE 10 MG PO TABS: ORAL_TABLET | ORAL | 0 refills | Status: DC

## 2016-12-03 NOTE — Telephone Encounter (Signed)
Pt still has headache, switching from one side to the next every day. Double Effexor and that didn't help.  What can she take to help?  She said she can't afford the last meds that was called in - $400.  Please call advise.

## 2016-12-03 NOTE — Telephone Encounter (Signed)
She has to give the increased dose of Effexor at least 4 weeks before further changes are made.  We can prescribe her a prednisone taper until she is able to refill the Imitrex.  Prednisone 10mg  tablet:  Take 6tabs x1day, then 5tabs x1day, then 4tabs x1day, then 3tabs x1day, then 2tabs x1day, then 1tab x1day, then STOP

## 2016-12-03 NOTE — Telephone Encounter (Signed)
Spoke with patient about Dr Starr Lake recommendations  Pt [facility] [address]

## 2016-12-03 NOTE — Telephone Encounter (Signed)
Dr Jaffe-  Please advise 

## 2016-12-15 ENCOUNTER — Ambulatory Visit (INDEPENDENT_AMBULATORY_CARE_PROVIDER_SITE_OTHER): Payer: BLUE CROSS/BLUE SHIELD | Admitting: Family Medicine

## 2016-12-15 VITALS — BP 120/70 | HR 72 | Temp 97.7°F | Wt 155.6 lb

## 2016-12-15 DIAGNOSIS — F411 Generalized anxiety disorder: Secondary | ICD-10-CM | POA: Diagnosis not present

## 2016-12-15 DIAGNOSIS — G43009 Migraine without aura, not intractable, without status migrainosus: Secondary | ICD-10-CM

## 2016-12-15 DIAGNOSIS — F3340 Major depressive disorder, recurrent, in remission, unspecified: Secondary | ICD-10-CM | POA: Diagnosis not present

## 2016-12-15 DIAGNOSIS — F4310 Post-traumatic stress disorder, unspecified: Secondary | ICD-10-CM | POA: Diagnosis not present

## 2016-12-15 MED ORDER — METHYLPREDNISOLONE ACETATE 80 MG/ML IJ SUSP
80.0000 mg | Freq: Once | INTRAMUSCULAR | Status: AC
  Administered 2016-12-15: 80 mg via INTRAMUSCULAR

## 2016-12-15 MED ORDER — KETOROLAC TROMETHAMINE 60 MG/2ML IM SOLN
60.0000 mg | Freq: Once | INTRAMUSCULAR | Status: AC
  Administered 2016-12-15: 60 mg via INTRAMUSCULAR

## 2016-12-15 NOTE — Progress Notes (Signed)
Subjective:     Patient ID: Gabriela Shepard, female   DOB: 07-27-1960, 56 y.o.   MRN: [id number]  HPI Patient seen with headache for the past 3-4 days. She has long-standing history of migraines and states this is very typical. Her headache was initially left temple and now somewhat bilateral but worse left temporal region. She's had some associated nausea without vomiting. She describes headache as throbbing to stabbing quality. She has photosensitivity. She was prescribed prednisone previously several weeks ago by her neurologist for similar type headache which was helping her headache but she had some associated nausea which was worse after taking the prednisone. She is very reluctant to consider oral prednisone. She does not have anything to take for nausea at this time.  She has pending appointment with new neurologist in  next month. No recent fever. No chills.  Past Medical History:  Diagnosis Date  . Arthritis   . Depression   . Headache   . 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 History of UTI   . Migraines   . PONV (postoperative nausea and vomiting)   . PTSD (post-traumatic stress disorder)   . Urine incontinence    Past Surgical History:  Procedure Laterality Date  . BREAST BIOPSY    . CESAREAN SECTION  1992  . COLONOSCOPY    . INCONTINENCE SURGERY    . LAPAROSCOPY     under bladder  . 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 Her smoking use included Cigarettes. She has a 5.00 pack-year smoking history. She has never used smokeless tobacco. She reports that she does not drink alcohol or use drugs. family history includes Alcohol abuse in her other; Arthritis in her other; Hypertension in her mother and other; Kidney cancer in her father; Mental illness in her  other. Allergies  Allergen Reactions  . Sulfa Antibiotics Hives     Review of Systems  Constitutional: Positive for fatigue. Negative for chills and fever.  Respiratory: Negative for shortness of breath.   Cardiovascular: Negative for chest pain.  Gastrointestinal: Positive for nausea. Negative for abdominal pain and vomiting.  Neurological: Positive for headaches. Negative for weakness.  Psychiatric/Behavioral: Negative for confusion.       Objective:   Physical Exam  Constitutional: She is oriented to person, place, and time. She appears well-developed and well-nourished.  HENT:  Mouth/Throat: Oropharynx is clear and moist.  Eyes: Pupils are equal, round, and reactive to light.  Fundi benign  Neck: Neck supple.  Cardiovascular: Normal rate and regular rhythm.   Pulmonary/Chest: Effort normal and breath sounds normal. No respiratory distress. She has no wheezes. She has no rales.  Neurological: She is alert and oriented to person, place, and time. No cranial nerve deficit. Coordination normal.  Skin: No rash noted.  Psychiatric: She has a normal mood and affect. Her behavior is normal.       Assessment:     Patient seen with over 72 hour history of intractable migraine.    Plan:     -Toradol 60 mg IM given-observed for 15 minutes with no adverse side effects. -Depo-Medrol 80 mg IM. She has had difficulty taking oral prednisone the past -Phenergan 25 mg one every 6 hours as needed for nausea and vomiting -Follow-up if headache  not resolving over the next day and sooner as needed  Eulas Post MD [facility] at [facility]

## 2016-12-21 DIAGNOSIS — M25571 Pain in right ankle and joints of right foot: Secondary | ICD-10-CM | POA: Diagnosis not present

## 2016-12-23 DIAGNOSIS — M6281 Muscle weakness (generalized): Secondary | ICD-10-CM | POA: Diagnosis not present

## 2016-12-23 DIAGNOSIS — M545 Low back pain: Secondary | ICD-10-CM | POA: Diagnosis not present

## 2016-12-28 DIAGNOSIS — M545 Low back pain: Secondary | ICD-10-CM | POA: Diagnosis not present

## 2016-12-28 DIAGNOSIS — M6281 Muscle weakness (generalized): Secondary | ICD-10-CM | POA: Diagnosis not present

## 2016-12-30 DIAGNOSIS — M545 Low back pain: Secondary | ICD-10-CM | POA: Diagnosis not present

## 2016-12-30 DIAGNOSIS — M6281 Muscle weakness (generalized): Secondary | ICD-10-CM | POA: Diagnosis not present

## 2017-01-06 DIAGNOSIS — M545 Low back pain: Secondary | ICD-10-CM | POA: Diagnosis not present

## 2017-01-06 DIAGNOSIS — M6281 Muscle weakness (generalized): Secondary | ICD-10-CM | POA: Diagnosis not present

## 2017-01-20 ENCOUNTER — Other Ambulatory Visit: Payer: Self-pay | Admitting: Family Medicine

## 2017-01-20 ENCOUNTER — Other Ambulatory Visit: Payer: Self-pay | Admitting: Physician Assistant

## 2017-01-20 DIAGNOSIS — D492 Neoplasm of unspecified behavior of bone, soft tissue, and skin: Secondary | ICD-10-CM | POA: Diagnosis not present

## 2017-01-20 DIAGNOSIS — D229 Melanocytic nevi, unspecified: Secondary | ICD-10-CM | POA: Diagnosis not present

## 2017-01-21 NOTE — Telephone Encounter (Signed)
Last refill 11/17/16 and last office visit 12/15/16.  Okay to fill?

## 2017-01-21 NOTE — Telephone Encounter (Signed)
Refill OK

## 2017-01-25 DIAGNOSIS — G43709 Chronic migraine without aura, not intractable, without status migrainosus: Secondary | ICD-10-CM | POA: Diagnosis not present

## 2017-02-10 ENCOUNTER — Encounter: Payer: BLUE CROSS/BLUE SHIELD | Admitting: Internal Medicine

## 2017-02-24 ENCOUNTER — Encounter: Payer: Self-pay | Admitting: Family Medicine

## 2017-03-07 ENCOUNTER — Ambulatory Visit (INDEPENDENT_AMBULATORY_CARE_PROVIDER_SITE_OTHER): Payer: BLUE CROSS/BLUE SHIELD | Admitting: Family Medicine

## 2017-03-07 ENCOUNTER — Encounter: Payer: Self-pay | Admitting: Family Medicine

## 2017-03-07 VITALS — BP 120/80 | HR 84 | Temp 99.0°F | Wt 148.7 lb

## 2017-03-07 DIAGNOSIS — R197 Diarrhea, unspecified: Secondary | ICD-10-CM

## 2017-03-07 DIAGNOSIS — M791 Myalgia, unspecified site: Secondary | ICD-10-CM

## 2017-03-07 LAB — COMPREHENSIVE METABOLIC PANEL
ALK PHOS: 51 U/L (ref 39–117)
ALT: 17 U/L (ref 0–35)
AST: 17 U/L (ref 0–37)
Albumin: 4.2 g/dL (ref 3.5–5.2)
BILIRUBIN TOTAL: 0.3 mg/dL (ref 0.2–1.2)
BUN: 10 mg/dL (ref 6–23)
CO2: 27 mEq/L (ref 19–32)
Calcium: 9.3 mg/dL (ref 8.4–10.5)
Chloride: 91 mEq/L — ABNORMAL LOW (ref 96–112)
Creatinine, Ser: 0.67 mg/dL (ref 0.40–1.20)
GFR: 96.69 mL/min (ref >60.00)
GLUCOSE: 121 mg/dL — AB (ref 70–99)
Potassium: 4.6 mEq/L (ref 3.5–5.1)
SODIUM: 127 meq/L — AB (ref 135–145)
TOTAL PROTEIN: 6.9 g/dL (ref 6.0–8.3)

## 2017-03-07 LAB — CBC WITH DIFFERENTIAL/PLATELET
BASOS ABS: 0 10*3/uL (ref 0.0–0.1)
Basophils Relative: 0.7 % (ref 0.0–3.0)
EOS ABS: 0.2 10*3/uL (ref 0.0–0.7)
Eosinophils Relative: 3.8 % (ref 0.0–5.0)
HCT: 38 % (ref 36.0–46.0)
Hemoglobin: 12.9 g/dL (ref 12.0–15.0)
LYMPHS ABS: 1.3 10*3/uL (ref 0.7–4.0)
Lymphocytes Relative: 26.7 % (ref 12.0–46.0)
MCHC: 33.8 g/dL (ref 30.0–36.0)
MCV: 87.4 fl (ref 78.0–100.0)
MONO ABS: 0.6 10*3/uL (ref 0.1–1.0)
MONOS PCT: 11.1 % (ref 3.0–12.0)
NEUTROS PCT: 57.7 % (ref 43.0–77.0)
Neutro Abs: 2.9 10*3/uL (ref 1.4–7.7)
Platelets: 334 10*3/uL (ref 150.0–400.0)
RBC: 4.35 Mil/uL (ref 3.87–5.11)
RDW: 12.5 % (ref 11.5–15.5)
WBC: 5 10*3/uL (ref 4.0–10.5)

## 2017-03-07 LAB — CK: Total CK: 32 U/L (ref 7–177)

## 2017-03-07 LAB — TSH: TSH: 1.55 u[IU]/mL (ref 0.35–4.50)

## 2017-03-07 MED ORDER — AZITHROMYCIN 250 MG PO TABS: ORAL_TABLET | ORAL | 0 refills | Status: DC

## 2017-03-07 NOTE — Progress Notes (Signed)
Subjective:     Patient ID: Gabriela Shepard, female   DOB: [birth year], 56 y.o.   MRN: [id number]  HPI Patient seen with multiple issues following recent travel. She states she was in [place] for a couple weeks and returned back here couple weeks ago. She's had some watery to loose stools sometimes up to 5 or 6 per day since return. Somewhat diminished appetite. No vomiting. No bloody stools. She's taking some Hycosamine which seems to help somewhat.  Occasional night sweats. Occasional chills. No documented fever.  Other issue is that she's had what she describes as "achy muscles "- since her return. Progressing over the past couple weeks. She denies any prior history of hepatitis A vaccine. She was traveling with her 2 children and they did not have any the same symptoms. No arthralgias.  She has also developed the past few days some mild cough and sore throat. Again, no documented fever. Increased fatigue.  Past Medical History:  Diagnosis Date  . Arthritis   . Depression   . Headache   . 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 History of UTI   . Migraines   . PONV (postoperative nausea and vomiting)   . PTSD (post-traumatic stress disorder)   . Urine incontinence    Past Surgical History:  Procedure Laterality Date  . BREAST BIOPSY    . CESAREAN SECTION  1992  . COLONOSCOPY    . INCONTINENCE SURGERY    . LAPAROSCOPY     under bladder  . 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 Her smoking use included Cigarettes. She has a 5.00 pack-year smoking history. She has never used smokeless tobacco. She reports that she does not drink alcohol or use drugs. family history includes Alcohol abuse in her other; Arthritis in her other; Hypertension in her mother and other; Kidney cancer in her  father; Mental illness in her other. Allergies  Allergen Reactions  . Sulfa Antibiotics Hives     Review of Systems  Constitutional: Positive for chills and fatigue. Negative for fever.  HENT: Positive for congestion. Negative for trouble swallowing.   Respiratory: Positive for cough.   Cardiovascular: Negative for chest pain.  Gastrointestinal: Positive for diarrhea. Negative for blood in stool.  Genitourinary: Negative for dysuria.  Musculoskeletal: Positive for myalgias. Negative for arthralgias.  Hematological: Negative for adenopathy.       Objective:   Physical Exam  Constitutional: She appears well-developed and well-nourished.  HENT:  Mouth/Throat: Oropharynx is clear and moist.  Neck: Neck supple.  Cardiovascular: Normal rate and regular rhythm.   Pulmonary/Chest: Effort normal and breath sounds normal. No respiratory distress. She has no wheezes. She has no rales.  Abdominal: Soft. She exhibits no mass. There is no tenderness. There is no rebound and no guarding.  Musculoskeletal: She exhibits no edema.  Skin: No rash noted.       Assessment:     Patient presents with 2 weeks at least of symptoms of diarrhea and myalgias following recent travel to [place]. She was briefly in [place]. No documented fever.  Nontoxic in appearance.    Plan:     -Check further labs with comprehensive metabolic panel, CBC, TSH, stool studies -Consider empirically treatment with azithromycin pending results above -Consider over-the-counter  electrolyte replacement -Monitor closely for any fever -consider empiric Flagyl if diarrhea persists or worsens.  Eulas Post MD [facility] at [facility]

## 2017-03-07 NOTE — Patient Instructions (Signed)

## 2017-03-08 ENCOUNTER — Other Ambulatory Visit: Payer: Self-pay | Admitting: Family Medicine

## 2017-03-08 DIAGNOSIS — E871 Hypo-osmolality and hyponatremia: Secondary | ICD-10-CM

## 2017-03-09 ENCOUNTER — Telehealth: Payer: Self-pay | Admitting: Family Medicine

## 2017-03-09 ENCOUNTER — Ambulatory Visit: Payer: BLUE CROSS/BLUE SHIELD | Admitting: Family Medicine

## 2017-03-09 LAB — OVA AND PARASITE EXAMINATION
CONCENTRATE RESULT:: NONE SEEN
MICRO NUMBER:: 81092301
SPECIMEN QUALITY: ADEQUATE
TRICHROME RESULT: NONE SEEN

## 2017-03-09 NOTE — Telephone Encounter (Signed)
I have not seen any results yet.  Did she take the Zithromax?

## 2017-03-09 NOTE — Telephone Encounter (Signed)
FYI - patient has started the medication.  She decided to reschedule the appointment for today to Friday.

## 2017-03-09 NOTE — Telephone Encounter (Signed)
Patient called to say that her s/s are getting worse and was wondering if her results from the stool sample had come in yet. Patient hs an appointment today at 530 to discuss s/s.

## 2017-03-11 ENCOUNTER — Other Ambulatory Visit (INDEPENDENT_AMBULATORY_CARE_PROVIDER_SITE_OTHER): Payer: BLUE CROSS/BLUE SHIELD

## 2017-03-11 ENCOUNTER — Ambulatory Visit (INDEPENDENT_AMBULATORY_CARE_PROVIDER_SITE_OTHER): Payer: BLUE CROSS/BLUE SHIELD | Admitting: Family Medicine

## 2017-03-11 ENCOUNTER — Encounter: Payer: Self-pay | Admitting: Family Medicine

## 2017-03-11 VITALS — BP 102/78 | HR 68 | Temp 98.8°F | Wt 146.5 lb

## 2017-03-11 DIAGNOSIS — R059 Cough, unspecified: Secondary | ICD-10-CM

## 2017-03-11 DIAGNOSIS — R195 Other fecal abnormalities: Secondary | ICD-10-CM

## 2017-03-11 DIAGNOSIS — E871 Hypo-osmolality and hyponatremia: Secondary | ICD-10-CM

## 2017-03-11 DIAGNOSIS — R05 Cough: Secondary | ICD-10-CM | POA: Diagnosis not present

## 2017-03-11 LAB — BASIC METABOLIC PANEL
BUN: 11 mg/dL (ref 6–23)
CHLORIDE: 97 meq/L (ref 96–112)
CO2: 28 meq/L (ref 19–32)
Calcium: 9.2 mg/dL (ref 8.4–10.5)
Creatinine, Ser: 0.64 mg/dL (ref 0.40–1.20)
GFR: 101.94 mL/min (ref >60.00)
GLUCOSE: 113 mg/dL — AB (ref 70–99)
POTASSIUM: 4.6 meq/L (ref 3.5–5.1)
Sodium: 133 mEq/L — ABNORMAL LOW (ref 135–145)

## 2017-03-11 MED ORDER — BENZONATATE 200 MG PO CAPS: 200.0000 mg | ORAL_CAPSULE | Freq: Three times a day (TID) | ORAL | 0 refills | Status: DC | PRN

## 2017-03-11 NOTE — Patient Instructions (Signed)
Food Choices to Help Relieve Diarrhea, Adult When you have diarrhea, the foods you eat and your eating habits are very important. Choosing the right foods and drinks can help:  Relieve diarrhea.  Replace lost fluids and nutrients.  Prevent dehydration.  What general guidelines should I follow? Relieving diarrhea  Choose foods with less than 2 g or .07 oz. of fiber per serving.  Limit fats to less than 8 tsp (38 g or 1.34 oz.) a day.  Avoid the following: ? Foods and beverages sweetened with high-fructose corn syrup, honey, or sugar alcohols such as xylitol, sorbitol, and mannitol. ? Foods that contain a lot of fat or sugar. ? Fried, greasy, or spicy foods. ? High-fiber grains, breads, and cereals. ? Raw fruits and vegetables.  Eat foods that are rich in probiotics. These foods include dairy products such as yogurt and fermented milk products. They help increase healthy bacteria in the stomach and intestines (gastrointestinal tract, or GI tract).  If you have lactose intolerance, avoid dairy products. These may make your diarrhea worse.  Take medicine to help stop diarrhea (antidiarrheal medicine) only as told by your health care provider. Replacing nutrients  Eat small meals or snacks every 3-4 hours.  Eat bland foods, such as white rice, toast, or baked potato, until your diarrhea starts to get better. Gradually reintroduce nutrient-rich foods as tolerated or as told by your health care provider. This includes: ? Well-cooked protein foods. ? Peeled, seeded, and soft-cooked fruits and vegetables. ? Low-fat dairy products.  Take vitamin and mineral supplements as told by your health care provider. Preventing dehydration   Start by sipping water or a special solution to prevent dehydration (oral rehydration solution, ORS). Urine that is clear or pale yellow means that you are getting enough fluid.  Try to drink at least 8-10 cups of fluid each day to help replace lost  fluids.  You may add other liquids in addition to water, such as clear juice or decaffeinated sports drinks, as tolerated or as told by your health care provider.  Avoid drinks with caffeine, such as coffee, tea, or soft drinks.  Avoid alcohol. What foods are recommended? The items listed may not be a complete list. Talk with your health care provider about what dietary choices are best for you. Grains White rice. White, French, or pita breads (fresh or toasted), including plain rolls, buns, or bagels. White pasta. Saltine, soda, or graham crackers. Pretzels. Low-fiber cereal. Cooked cereals made with water (such as cornmeal, farina, or cream cereals). Plain muffins. Matzo. Melba toast. Zwieback. Vegetables Potatoes (without the skin). Most well-cooked and canned vegetables without skins or seeds. Tender lettuce. Fruits Apple sauce. Fruits canned in juice. Cooked apricots, cherries, grapefruit, peaches, pears, or plums. Fresh bananas and cantaloupe. Meats and other protein foods Baked or boiled chicken. Eggs. Tofu. Fish. Seafood. Smooth nut butters. Ground or well-cooked tender beef, ham, veal, lamb, pork, or poultry. Dairy Plain yogurt, kefir, and unsweetened liquid yogurt. Lactose-free milk, buttermilk, skim milk, or soy milk. Low-fat or nonfat hard cheese. Beverages Water. Low-calorie sports drinks. Fruit juices without pulp. Strained tomato and vegetable juices. Decaffeinated teas. Sugar-free beverages not sweetened with sugar alcohols. Oral rehydration solutions, if approved by your health care provider. Seasoning and other foods Bouillon, broth, or soups made from recommended foods. What foods are not recommended? The items listed may not be a complete list. Talk with your health care provider about what dietary choices are best for you. Grains Whole grain, whole wheat,   bran, or rye breads, rolls, pastas, and crackers. Wild or brown rice. Whole grain or bran cereals. Barley. Oats and  oatmeal. Corn tortillas or taco shells. Granola. Popcorn. Vegetables Raw vegetables. Fried vegetables. Cabbage, broccoli, Brussels sprouts, artichokes, baked beans, beet greens, corn, kale, legumes, peas, sweet potatoes, and yams. Potato skins. Cooked spinach and cabbage. Fruits Dried fruit, including raisins and dates. Raw fruits. Stewed or dried prunes. Canned fruits with syrup. Meat and other protein foods Fried or fatty meats. Deli meats. Chunky nut butters. Nuts and seeds. Beans and lentils. Bacon. Hot dogs. Sausage. Dairy High-fat cheeses. Whole milk, chocolate milk, and beverages made with milk, such as milk shakes. Half-and-half. Cream. sour cream. Ice cream. Beverages Caffeinated beverages (such as coffee, tea, soda, or energy drinks). Alcoholic beverages. Fruit juices with pulp. Prune juice. Soft drinks sweetened with high-fructose corn syrup or sugar alcohols. High-calorie sports drinks. Fats and oils Butter. Cream sauces. Margarine. Salad oils. Plain salad dressings. Olives. Avocados. Mayonnaise. Sweets and desserts Sweet rolls, doughnuts, and sweet breads. Sugar-free desserts sweetened with sugar alcohols such as xylitol and sorbitol. Seasoning and other foods Honey. Hot sauce. Chili powder. Gravy. Cream-based or milk-based soups. Pancakes and waffles. Summary  When you have diarrhea, the foods you eat and your eating habits are very important.  Make sure you get at least 8-10 cups of fluid each day, or enough to keep your urine clear or pale yellow.  Eat bland foods and gradually reintroduce healthy, nutrient-rich foods as tolerated, or as told by your health care provider.  Avoid high-fiber, fried, greasy, or spicy foods. This information is not intended to replace advice given to you by your health care provider. Make sure you discuss any questions you have with your health care provider. Document Released: 08/14/2003 Document Revised: 05/21/2016 Document Reviewed:  05/21/2016 Elsevier Interactive Patient Education  2017 Elsevier Inc.  

## 2017-03-11 NOTE — Progress Notes (Signed)
Subjective:     Patient ID: Gabriela Shepard, female   DOB: 11-16-1960, 56 y.o.   MRN: [id number]  HPI   Patient seen for follow-up from recent illness. She presented with some decreased appetite, diarrhea, and diffuse mild myalgias and muscle aches after trip to [place]. We obtained several labs. Sodium was low 127 and CBC was normal. TSH normal. Creatine kinase normal. Ova and parasites negative. Stool culture pending. Patient was treated with azithromycin.  She does feel some better at this time. Her stool frequency is decreased slightly and she has more loose stool than watery diarrhea.. Still has slight queasiness but no vomiting. She's had some dry cough and chest wall soreness which he thinks is due to coughing. She denied documented fever.  Past Medical History:  Diagnosis Date  . Arthritis   . Depression   . Headache   . 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 History of UTI   . Migraines   . PONV (postoperative nausea and vomiting)   . PTSD (post-traumatic stress disorder)   . Urine incontinence    Past Surgical History:  Procedure Laterality Date  . BREAST BIOPSY    . CESAREAN SECTION  1992  . COLONOSCOPY    . INCONTINENCE SURGERY    . LAPAROSCOPY     under bladder  . 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 Her smoking use included Cigarettes. She has a 5.00 pack-year smoking history. She has never used smokeless tobacco. She reports that she does not drink alcohol or use drugs. family history includes Alcohol abuse in her other; Arthritis in her other; Hypertension in her mother and other; Kidney cancer in her father; Mental illness in her other. Allergies  Allergen Reactions  . Sulfa Antibiotics Hives     Review of Systems  Constitutional: Positive for appetite change  and fatigue. Negative for chills and fever.  Respiratory: Positive for cough.   Gastrointestinal: Positive for diarrhea and nausea. Negative for abdominal pain, blood in stool and vomiting.  Skin: Negative for rash.       Objective:   Physical Exam  Constitutional: She appears well-developed and well-nourished.  HENT:  Mouth/Throat: Oropharynx is clear and moist.  Neck: Neck supple. No thyromegaly present.  Cardiovascular: Normal rate and regular rhythm.   Pulmonary/Chest: Effort normal and breath sounds normal. No respiratory distress. She has no wheezes. She has no rales.  Musculoskeletal: She exhibits no edema.  Skin: No rash noted.       Assessment:     #1 diarrhea slightly improved with O&P negative and bacterial culture pending. Patient treated with Zithromax as above  #2 hyponatremia by recent labs  #3 myalgias-improving with recent normal TSH and creatinine kinase  #4 cough. Nonfocal exam. Suspect viral illness    Plan:     -Continue electrolyte replacement -Recheck basic metabolic panel -Tessalon Perles 200 mg every 8 hours as needed for cough -Follow-up immediately for any fever or other concerning symptoms  Eulas Post MD  [facility] at [facility]

## 2017-03-12 LAB — STOOL CULTURE
MICRO NUMBER: 81092302
MICRO NUMBER: 81092304
MICRO NUMBER:: 81092303
SHIGA RESULT:: NOT DETECTED
SPECIMEN QUALITY: ADEQUATE
SPECIMEN QUALITY:: ADEQUATE
SPECIMEN QUALITY:: ADEQUATE

## 2017-03-15 ENCOUNTER — Telehealth: Payer: Self-pay | Admitting: Family Medicine

## 2017-03-15 NOTE — Telephone Encounter (Signed)
Gabriela Shepard pt is returning your call did not see the reason why

## 2017-03-15 NOTE — Telephone Encounter (Signed)
Patient is needing to come to lab to pick up kit to preform c diff.  

## 2017-03-15 NOTE — Addendum Note (Signed)
Addended by: Valere Dross on: 03/15/2017 02:18 PM   Modules accepted: Orders

## 2017-03-18 ENCOUNTER — Ambulatory Visit (INDEPENDENT_AMBULATORY_CARE_PROVIDER_SITE_OTHER): Payer: BLUE CROSS/BLUE SHIELD | Admitting: Family Medicine

## 2017-03-18 VITALS — BP 122/68 | HR 86 | Ht 64.0 in | Wt 151.8 lb

## 2017-03-18 DIAGNOSIS — R252 Cramp and spasm: Secondary | ICD-10-CM | POA: Diagnosis not present

## 2017-03-18 DIAGNOSIS — R5383 Other fatigue: Secondary | ICD-10-CM | POA: Diagnosis not present

## 2017-03-18 DIAGNOSIS — M791 Myalgia, unspecified site: Secondary | ICD-10-CM

## 2017-03-18 DIAGNOSIS — M79605 Pain in left leg: Secondary | ICD-10-CM | POA: Diagnosis not present

## 2017-03-18 LAB — MAGNESIUM: Magnesium: 2.1 mg/dL (ref 1.5–2.5)

## 2017-03-18 LAB — BASIC METABOLIC PANEL
BUN: 12 mg/dL (ref 6–23)
CALCIUM: 8.3 mg/dL — AB (ref 8.4–10.5)
CO2: 28 meq/L (ref 19–32)
CREATININE: 0.69 mg/dL (ref 0.40–1.20)
Chloride: 96 mEq/L (ref 96–112)
GFR: 93.45 mL/min (ref >60.00)
GLUCOSE: 127 mg/dL — AB (ref 70–99)
Potassium: 4.1 mEq/L (ref 3.5–5.1)
SODIUM: 132 meq/L — AB (ref 135–145)

## 2017-03-18 LAB — VITAMIN D 25 HYDROXY (VIT D DEFICIENCY, FRACTURES): VITD: 40.54 ng/mL (ref 30.00–100.00)

## 2017-03-18 LAB — SEDIMENTATION RATE: Sed Rate: 28 mm/hr (ref 0–30)

## 2017-03-18 NOTE — Progress Notes (Signed)
Subjective:     Patient ID: Gabriela Shepard, female   DOB: Aug 22, 1960, 56 y.o.   MRN: [id number]  HPI Patient's had multiple nonspecific symptoms following trip to [place]. She initially presented with some abdominal cramping and diarrhea. O&P and stool culture was negative. Stool culture also  Negative. She was treated with antibiotics and diarrhea symptoms are improving.  She's had some leg cramps and general myalgias especially in both legs. Recent labs including TSH and creatinine kinase were unremarkable. She had low sodium initially which improved at follow-up. Potassium normal. Still has some leg cramps in both legs intermittently. She is drinking plenty of fluids and is focused on good electrolyte replacement with things like Gatorade.  She states she is anxious to get back to activities but has increased fatigue. No fevers or chills. No cough. She's had previous left total knee replacement and feels that she may have some swelling in the left calf and diffuse achy pain throughout the left lower extremity and question of some weakness left lower extremity. Denies any left lumbar back pain currently.  Patient also complains of "sore" area right lateral breast. She first noticed this after her dog bumped into her recently. No bruising. She's not noted any mass. No axillary adenopathy. She had mammogram she states about 6 months ago which was normal  Past Medical History:  Diagnosis Date  . Arthritis   . Depression   . Headache   . 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 History of UTI   . Migraines   . PONV (postoperative nausea and vomiting)   . PTSD (post-traumatic stress disorder)   . Urine incontinence    Past Surgical History:  Procedure Laterality Date  . BREAST BIOPSY    . CESAREAN SECTION  1992  . COLONOSCOPY    . INCONTINENCE SURGERY    . LAPAROSCOPY     under bladder  . 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 Her smoking use included Cigarettes. She has a 5.00 pack-year smoking history. She has never used smokeless tobacco. She reports that she does not drink alcohol or use drugs. family history includes Alcohol abuse in her other; Arthritis in her other; Hypertension in her mother and other; Kidney cancer in her father; Mental illness in her other. Allergies  Allergen Reactions  . Sulfa Antibiotics Hives     Review of Systems  Constitutional: Positive for fatigue.  Eyes: Negative for visual disturbance.  Respiratory: Negative for cough.   Cardiovascular: Negative for chest pain and palpitations.  Gastrointestinal: Negative for blood in stool, nausea and vomiting.  Musculoskeletal: Positive for myalgias.  Neurological: Negative for dizziness, light-headedness and numbness.  Hematological: Negative for adenopathy. Does not bruise/bleed easily.       Objective:   Physical Exam  Constitutional: She appears well-developed and well-nourished.  Cardiovascular: Normal rate and regular rhythm.   Pulmonary/Chest: Effort normal and breath sounds normal. No respiratory distress. She has no wheezes. She has no rales.  Right breast was examined with nurse chaperone present. Right breast reveals no nipple inversion and no skin dimpling. No masses palpated. No axillary adenopathy.  Abdominal: Soft. Bowel sounds are normal. She exhibits no distension and no mass. There is no tenderness. There is no rebound and no guarding.  Musculoskeletal: She exhibits no edema.  I cannot appreciate any significant difference in comparing left calf versus right. She does not have any foot or ankle edema. Both feet are warm to touch with good distal pulses. She has full range of motion left hip. No localized tenderness.  Neurological:  Symmetric reflexes ankle and knee  bilaterally. She has full strength with plantar flexion, dorsiflexion and hip flexion bilaterally. Mild weakness with left leg extension versus the right       Assessment:     Patient presents with multiple nonspecific symptoms including fatigue, intermittent leg cramps, diffuse myalgias without arthralgias, and resolving diarrhea following recent travel.  Patient concerned about possible left calf edema but low clinical suspicion for DVT    Plan:     -Check further labs with magnesium level, 25 OH vitamin D, d-dimer, basic metabolic panel, sedimentation rate -Continue to stay well-hydrated -Follow-up immediately for any shortness of breath, chest pain, or any evidence for left lower extremity edema  Eulas Post MD [facility] at [facility]

## 2017-03-19 LAB — D-DIMER, QUANTITATIVE (NOT AT ARMC): D DIMER QUANT: 0.68 ug{FEU}/mL — AB (ref <0.50)

## 2017-03-21 ENCOUNTER — Telehealth: Payer: Self-pay | Admitting: Family Medicine

## 2017-03-21 ENCOUNTER — Other Ambulatory Visit: Payer: Self-pay | Admitting: Family Medicine

## 2017-03-21 ENCOUNTER — Ambulatory Visit: Payer: BLUE CROSS/BLUE SHIELD | Admitting: Family Medicine

## 2017-03-21 DIAGNOSIS — M79605 Pain in left leg: Secondary | ICD-10-CM

## 2017-03-21 NOTE — Telephone Encounter (Signed)
° ° ° °  Pt would like a call back concerning her lab results. She said she has miss so much time from work and would know what is going on with her

## 2017-03-21 NOTE — Telephone Encounter (Signed)
FYI - patient wanted to let you know she still has low energy and pain in the left hip.

## 2017-03-21 NOTE — Telephone Encounter (Signed)
Spoke with patient and a doppler was ordered.

## 2017-03-22 ENCOUNTER — Ambulatory Visit (HOSPITAL_COMMUNITY)
Admission: RE | Admit: 2017-03-22 | Discharge: 2017-03-22 | Disposition: A | Discharge status: Home / Self Care | Payer: BLUE CROSS/BLUE SHIELD | Source: Ambulatory Visit | Attending: Cardiology | Admitting: Cardiology

## 2017-03-22 DIAGNOSIS — M79605 Pain in left leg: Secondary | ICD-10-CM | POA: Insufficient documentation

## 2017-03-28 ENCOUNTER — Encounter: Payer: Self-pay | Admitting: Family Medicine

## 2017-03-30 ENCOUNTER — Encounter: Payer: BLUE CROSS/BLUE SHIELD | Admitting: Family Medicine

## 2017-03-31 ENCOUNTER — Encounter: Payer: BLUE CROSS/BLUE SHIELD | Admitting: Internal Medicine

## 2017-04-01 ENCOUNTER — Ambulatory Visit (INDEPENDENT_AMBULATORY_CARE_PROVIDER_SITE_OTHER): Payer: BLUE CROSS/BLUE SHIELD | Admitting: Family Medicine

## 2017-04-01 VITALS — BP 124/76 | HR 79 | Temp 97.8°F | Ht 64.0 in | Wt 156.3 lb

## 2017-04-01 DIAGNOSIS — M1612 Unilateral primary osteoarthritis, left hip: Secondary | ICD-10-CM | POA: Diagnosis not present

## 2017-04-01 DIAGNOSIS — R252 Cramp and spasm: Secondary | ICD-10-CM

## 2017-04-01 MED ORDER — BACLOFEN 10 MG PO TABS: 10.0000 mg | ORAL_TABLET | Freq: Three times a day (TID) | ORAL | 0 refills | Status: DC | PRN

## 2017-04-01 NOTE — Progress Notes (Signed)
Subjective:     Patient ID: Gabriela Shepard, female   DOB: 1960-07-17, 56 y.o.   MRN: [id number]  HPI Follow-up bilateral leg cramps. She's had these past few weeks. We obtained several labs including chemistries and magnesium and potassium were normal. Slightly low sodium. She has been drinking some Gatorade without much relief. She has cramps in her feet and calves occasionally and thighs. Checks multi B vitamin complex. Denies any change of exercise regimen. No claudication symptoms. Recent mildly elevated d-dimer. Venous Doppler negative for DVT.  Past Medical History:  Diagnosis Date  . Arthritis   . Depression   . Headache   . 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 History of UTI   . Migraines   . PONV (postoperative nausea and vomiting)   . PTSD (post-traumatic stress disorder)   . Urine incontinence    Past Surgical History:  Procedure Laterality Date  . BREAST BIOPSY    . CESAREAN SECTION  1992  . COLONOSCOPY    . INCONTINENCE SURGERY    . LAPAROSCOPY     under bladder  . 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 Her smoking use included Cigarettes. She has a 5.00 pack-year smoking history. She has never used smokeless tobacco. She reports that she does not drink alcohol or use drugs. family history includes Alcohol abuse in her other; Arthritis in her other; Hypertension in her mother and other; Kidney cancer in her father; Mental illness in her other. Allergies  Allergen Reactions  . Sulfa Antibiotics Hives     Review of Systems  Constitutional: Negative for chills, fatigue and fever.  Eyes: Negative for visual disturbance.  Respiratory: Negative for cough, chest tightness, shortness of breath and wheezing.   Cardiovascular: Negative for chest pain, palpitations and leg  swelling.  Neurological: Negative for dizziness, seizures, syncope, weakness, light-headedness and headaches.       Objective:   Physical Exam  Constitutional: She appears well-developed and well-nourished.  Eyes: Pupils are equal, round, and reactive to light.  Neck: Neck supple. No JVD present. No thyromegaly present.  Cardiovascular: Normal rate and regular rhythm.  Exam reveals no gallop.   Pulmonary/Chest: Effort normal and breath sounds normal. No respiratory distress. She has no wheezes. She has no rales.  Musculoskeletal: She exhibits no edema.  Neurological: She is alert.       Assessment:     Bilateral leg cramps. She does not describe restless leg symptoms. Recent electrolytes unremarkable    Plan:     -We discussed at some length conservative measures-continue B vitamin complex and consider OTC tumeric. -She will consider trial of tonic water -Baclofen 10 mg daily at bedtime when necessary -Consider neurology referral if symptoms persist  Eulas Post MD [facility] at [facility]

## 2017-04-01 NOTE — Patient Instructions (Signed)
Leg Cramps Leg cramps occur when a muscle or muscles tighten and you have no control over this tightening (involuntary muscle contraction). Muscle cramps can develop in any muscle, but the most common place is in the calf muscles of the leg. Those cramps can occur during exercise or when you are at rest. Leg cramps are painful, and they may last for a few seconds to a few minutes. Cramps may return several times before they finally stop. Usually, leg cramps are not caused by a serious medical problem. In many cases, the cause is not known. Some common causes include:  Overexertion.  Overuse from repetitive motions, or doing the same thing over and over.  Remaining in a certain position for a long period of time.  Improper preparation, form, or technique while performing a sport or an activity.  Dehydration.  Injury.  Side effects of some medicines.  Abnormally low levels of the salts and ions in your blood (electrolytes), especially potassium and calcium. These levels could be low if you are taking water pills (diuretics) or if you are pregnant.  Follow these instructions at home: Watch your condition for any changes. Taking the following actions may help to lessen any discomfort that you are feeling:  Stay well-hydrated. Drink enough fluid to keep your urine clear or pale yellow.  Try massaging, stretching, and relaxing the affected muscle. Do this for several minutes at a time.  For tight or tense muscles, use a warm towel, heating pad, or hot shower water directed to the affected area.  If you are sore or have pain after a cramp, applying ice to the affected area may relieve discomfort. ? Put ice in a plastic bag. ? Place a towel between your skin and the bag. ? Leave the ice on for 20 minutes, 2-3 times per day.  Avoid strenuous exercise for several days if you have been having frequent leg cramps.  Make sure that your diet includes the essential minerals for your muscles to  work normally.  Take medicines only as directed by your health care provider.  Contact a health care provider if:  Your leg cramps get more severe or more frequent, or they do not improve over time.  Your foot becomes cold, numb, or blue. This information is not intended to replace advice given to you by your health care provider. Make sure you discuss any questions you have with your health care provider. Document Released: 07/01/2004 Document Revised: 10/30/2015 Document Reviewed: 05/01/2014 Elsevier Interactive Patient Education  2018 Elsevier Inc.  

## 2017-04-06 ENCOUNTER — Telehealth: Payer: Self-pay | Admitting: Family Medicine

## 2017-04-06 DIAGNOSIS — M1612 Unilateral primary osteoarthritis, left hip: Secondary | ICD-10-CM | POA: Diagnosis not present

## 2017-04-06 DIAGNOSIS — M25552 Pain in left hip: Secondary | ICD-10-CM | POA: Diagnosis not present

## 2017-04-06 NOTE — Telephone Encounter (Signed)
This could be a reaction from either one of these. She should rest, drink fluids, and try some Benadryl.

## 2017-04-06 NOTE — Telephone Encounter (Signed)
° ° ° °  Pt call to say she received a flu shot from the pharmacy and a cortizone injection and has been having night sweats and is wondering if this is a reaction.

## 2017-04-07 ENCOUNTER — Encounter: Payer: Self-pay | Admitting: Family Medicine

## 2017-04-07 ENCOUNTER — Ambulatory Visit (INDEPENDENT_AMBULATORY_CARE_PROVIDER_SITE_OTHER): Payer: BLUE CROSS/BLUE SHIELD | Admitting: Family Medicine

## 2017-04-07 ENCOUNTER — Telehealth: Payer: Self-pay | Admitting: Family Medicine

## 2017-04-07 VITALS — BP 120/80 | HR 88 | Temp 97.7°F | Wt 153.6 lb

## 2017-04-07 DIAGNOSIS — R05 Cough: Secondary | ICD-10-CM

## 2017-04-07 DIAGNOSIS — R61 Generalized hyperhidrosis: Secondary | ICD-10-CM | POA: Diagnosis not present

## 2017-04-07 DIAGNOSIS — R059 Cough, unspecified: Secondary | ICD-10-CM

## 2017-04-07 NOTE — Progress Notes (Signed)
Subjective:    Patient ID: Gabriela Shepard, female    DOB: [birth year], 56 y.o.   MRN: [id number]  Chief Complaint  Patient presents with  . Night Sweats    HPI Patient was seen today for acute concern.  Patient given cortisone injection for hip on Friday. Later that day received flu shot [facility]. On Saturday patient endorses night sweats, headache. Night sweats soak pt's bedsheets.  Pt also endorses feeling flushed.  Pt also expresses concern of dry cough and feeling of "knives in chest".  Pt states she has not felt "right" after returning 5 wks ago from [place].  Pt has been seen by her PCP several times for concerns s/p her trip.  Stool studies, CBC, CMP were negative.  Pt endorses ongoing hip pain, unsure about fevers.  Pt was in the [place] and the [place] of [place].  She did not take malaria ppx.  Past Medical History:  Diagnosis Date  . Arthritis   . Depression   . Headache   . 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 History of UTI   . Migraines   . PONV (postoperative nausea and vomiting)   . PTSD (post-traumatic stress disorder)   . Urine incontinence     Allergies  Allergen Reactions  . Sulfa Antibiotics Hives    ROS General: Denies fever, chills, changes in weight, changes in appetite   +night sweats, hot flashes, feeling flushed HEENT: Denies headaches, ear pain, changes in vision, rhinorrhea, sore throat CV: Denies CP, palpitations, SOB, orthopnea Pulm: Denies SOB, wheezing  +cough GI: Denies abdominal pain, nausea, vomiting, diarrhea, constipation GU: Denies dysuria, hematuria, frequency, vaginal discharge Msk: Denies muscle cramps, joint pains Neuro: Denies weakness, numbness, tingling Skin: Denies rashes, bruising Psych: Denies depression, anxiety, hallucinations     Objective:    Blood pressure 120/80, pulse 88, temperature 97.7 F (36.5 C), temperature source Oral, weight 153 lb 9.6 oz (69.7 kg).   Gen. Pleasant,  well-nourished, in no distress, normal affect   HEENT: [place]/AT, face symmetric, no scleral icterus, PERRLA, nares patent without drainage, pharynx without erythema or exudate. Lungs: cough, no accessory muscle use, CTAB, no wheezes or rales Cardiovascular: RRR, no m/r/g, no peripheral edema Neuro:  A&Ox3, CN II-XII intact, normal gait Skin:  Warm, no lesions/ rash   Wt Readings from Last 3 Encounters:  04/07/17 153 lb 9.6 oz (69.7 kg)  04/01/17 156 lb 5 oz (70.9 kg)  03/18/17 151 lb 12.8 oz (68.9 kg)    Lab Results  Component Value Date   WBC 5.0 03/07/2017   HGB 12.9 03/07/2017   HCT 38.0 03/07/2017   PLT 334.0 03/07/2017   GLUCOSE 127 (H) 03/18/2017   CHOL 134 08/21/2014   TRIG 42.0 08/21/2014   HDL 55.80 08/21/2014   LDLCALC 70 08/21/2014   ALT 17 03/07/2017   AST 17 03/07/2017   NA 132 (L) 03/18/2017   K 4.1 03/18/2017   CL 96 03/18/2017   CREATININE 0.69 03/18/2017   BUN 12 03/18/2017   CO2 28 03/18/2017   TSH 1.55 03/07/2017   INR 1.01 12/11/2015    Assessment/Plan:  Night sweats  -discussed causes of night sweats.  -also discussed possible s/e from influenza vaccine/cortisone inj. -Will obtain CXR and evaluate for malaria via blood test.  Unable to do thick and thin smear in clinic. - Plan: DG Chest 2 View, Trypanosoma cruzi [place], Total  Cough -Discussed causes of cough -  Will obtain CXR -supportive care   F/u in 1 wk, sooner if needed.

## 2017-04-07 NOTE — Patient Instructions (Signed)
Please proceed to [facility] for xray.

## 2017-04-07 NOTE — Telephone Encounter (Signed)
Noted  

## 2017-04-07 NOTE — Telephone Encounter (Signed)
Patient Name: Gabriela Shepard DOB: [birth year] Initial Comment Caller states she got a flu shot and can't stop sweating. Nurse Assessment Nurse: Holly Bodily, RN, Nicci Date/Time (Eastern Time): 04/07/2017 9:23:36 AM Confirm and document reason for call. If symptomatic, describe symptoms. ---Caller states she got a flu shot and can't stop sweating. Friday morning she had a Cortisone shot as well. Since Sunday, caller states she wakes up with bed soaked and hair soaked, very fatigued. Caller states she has to be because of the flushes of heat and cold chills. She has not taken her temperature. Does the patient have any new or worsening symptoms? ---Yes Will a triage be completed? ---Yes Related visit to physician within the last 2 weeks? ---No Does the PT have any chronic conditions? (i.e. diabetes, asthma, etc.) ---No Is this a behavioral health or substance abuse call? ---No Guidelines Guideline Title Affirmed Question Affirmed Notes Sweating [1] NIGHT SWEATS occur (e.g., drenching sweat that occurs at night and has to change bed clothes or bed sheets) AND [2] cause unknown Final Disposition User See Physician within 24 Hours Corum, RN, Nicci Comments Caller states that an appointment was already scheduled for 3pm today with [facility]. Referrals REFERRED TO PCP OFFICE Caller Disagree/Comply Comply Caller Understands Yes PreDisposition Did not know what to do

## 2017-04-08 ENCOUNTER — Ambulatory Visit (INDEPENDENT_AMBULATORY_CARE_PROVIDER_SITE_OTHER)
Admission: RE | Admit: 2017-04-08 | Discharge: 2017-04-08 | Disposition: A | Discharge status: Home / Self Care | Payer: BLUE CROSS/BLUE SHIELD | Source: Ambulatory Visit | Attending: Family Medicine | Admitting: Family Medicine

## 2017-04-08 DIAGNOSIS — R05 Cough: Secondary | ICD-10-CM | POA: Diagnosis not present

## 2017-04-08 DIAGNOSIS — R61 Generalized hyperhidrosis: Secondary | ICD-10-CM | POA: Diagnosis not present

## 2017-04-08 NOTE — Telephone Encounter (Signed)
Patient came in for an office visit

## 2017-04-11 LAB — TRYPANOSOMA CRUZI ANTIBODY, TOTAL: TRYPANOSOMA CRUZI ANTIBODY TOTAL: NONREACTIVE

## 2017-04-13 ENCOUNTER — Telehealth: Payer: Self-pay | Admitting: Family Medicine

## 2017-04-13 NOTE — Telephone Encounter (Signed)
Pt states she saw Dr Volanda Napoleon and was advised to consider starting a nasal spray for her allergies. Pt would like to proceed and get a nasal spray called in to   [id number] [place], [place] [address]

## 2017-04-13 NOTE — Telephone Encounter (Signed)
Please advise if okay to send in nasal spray? Thank you

## 2017-04-13 NOTE — Telephone Encounter (Signed)
[place] with me if it is ok with her pcp.

## 2017-04-14 ENCOUNTER — Other Ambulatory Visit: Payer: Self-pay | Admitting: Emergency Medicine

## 2017-04-14 MED ORDER — FLUTICASONE PROPIONATE 50 MCG/ACT NA SUSP: 2.0000 | Freq: Every day | NASAL | 11 refills | Status: DC

## 2017-04-14 NOTE — Telephone Encounter (Signed)
Please advise if okay to send in prescription for Flonase. Thank you

## 2017-04-14 NOTE — Telephone Encounter (Signed)
Prescription has been sent into patient pharmacy

## 2017-04-14 NOTE — Telephone Encounter (Signed)
flonase is OTC but if she has health savings plan or other benefit may call in -Flonase 2 sprays per nostril once daily with 11 refills.

## 2017-04-15 DIAGNOSIS — M1712 Unilateral primary osteoarthritis, left knee: Secondary | ICD-10-CM | POA: Diagnosis not present

## 2017-04-19 ENCOUNTER — Ambulatory Visit (AMBULATORY_SURGERY_CENTER): Payer: Self-pay

## 2017-04-19 VITALS — Ht 64.0 in | Wt 154.8 lb

## 2017-04-19 DIAGNOSIS — Z86010 Personal history of colonic polyps: Secondary | ICD-10-CM

## 2017-04-19 DIAGNOSIS — R194 Change in bowel habit: Secondary | ICD-10-CM

## 2017-04-19 MED ORDER — NA SULFATE-K SULFATE-MG SULF 17.5-3.13-1.6 GM/177ML PO SOLN: 1.0000 | Freq: Once | ORAL | 0 refills | Status: AC

## 2017-04-19 NOTE — Progress Notes (Signed)
Per pt, no allergies to soy or egg products.Pt not taking any weight loss meds or using  O2 at home.   Pt refused Emmi video. 

## 2017-05-02 ENCOUNTER — Ambulatory Visit (INDEPENDENT_AMBULATORY_CARE_PROVIDER_SITE_OTHER): Payer: BLUE CROSS/BLUE SHIELD | Admitting: Family Medicine

## 2017-05-02 ENCOUNTER — Encounter: Payer: Self-pay | Admitting: Family Medicine

## 2017-05-02 VITALS — BP 110/80 | HR 76 | Temp 97.5°F | Ht 65.0 in | Wt 153.0 lb

## 2017-05-02 DIAGNOSIS — Z0000 Encounter for general adult medical examination without abnormal findings: Secondary | ICD-10-CM

## 2017-05-02 LAB — LIPID PANEL
CHOL/HDL RATIO: 3
Cholesterol: 201 mg/dL — ABNORMAL HIGH (ref 0–200)
HDL: 62.2 mg/dL (ref >39.00)
LDL Cholesterol: 120 mg/dL — ABNORMAL HIGH (ref 0–99)
NONHDL: 139.12
Triglycerides: 97 mg/dL (ref 0.0–149.0)
VLDL: 19.4 mg/dL (ref 0.0–40.0)

## 2017-05-02 LAB — BASIC METABOLIC PANEL
BUN: 13 mg/dL (ref 6–23)
CALCIUM: 9.4 mg/dL (ref 8.4–10.5)
CO2: 29 meq/L (ref 19–32)
Chloride: 96 mEq/L (ref 96–112)
Creatinine, Ser: 0.71 mg/dL (ref 0.40–1.20)
GFR: 90.38 mL/min (ref >60.00)
GLUCOSE: 91 mg/dL (ref 70–99)
Potassium: 4.4 mEq/L (ref 3.5–5.1)
Sodium: 132 mEq/L — ABNORMAL LOW (ref 135–145)

## 2017-05-02 LAB — HEMOGLOBIN A1C: HEMOGLOBIN A1C: 5.9 % (ref 4.6–6.5)

## 2017-05-02 MED ORDER — CLONAZEPAM 1 MG PO TABS: ORAL_TABLET | ORAL | 1 refills | Status: DC

## 2017-05-02 MED ORDER — VENLAFAXINE HCL ER 150 MG PO CP24: 150.0000 mg | ORAL_CAPSULE | Freq: Every day | ORAL | 3 refills | Status: DC

## 2017-05-02 MED ORDER — LAMOTRIGINE 25 MG PO TABS: 25.0000 mg | ORAL_TABLET | Freq: Every day | ORAL | 3 refills | Status: DC

## 2017-05-02 NOTE — Progress Notes (Signed)
Subjective:     Patient ID: Gabriela Shepard, female   DOB: Oct 25, 1960, 56 y.o.   MRN: [id number]  HPI Patient seen for physical exam. She has had prior history of left total knee replacement and is looking at getting left total hip replacement December 20th. She got back from trip from [place] and was having all sorts of nonspecific symptoms including malaise, myalgias, night sweats. Blood work which was unrevealing. She went back to orthopedist and they obtained some follow-up hip x-rays and she has severe osteoarthritis.  She has no history of diabetes but has had some recent elevated nonfasting blood sugars  She has scheduled a colonoscopy for tomorrow. She's been getting Pap smears about every 2 years. She had bone density scan last year which showed mild osteopenia.  She's not had new shingles vaccine. She is due for Tdap. No history of hepatitis C screening and is low risk  She has history of recurrent depression and mood disorder and takes regimen currently of Effexor, Lamictal, and Klonopin. Requesting refills.  She has past history of alcohol abuse and has done outstanding job for several years now of abstinence.    Past Medical History:  Diagnosis Date  . Arthritis   . Bowel habit changes    been going on couple years  . Depression   . Flatulence    excessive with strong odor/uncontrollable  . Headache   . 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 History of UTI   . Migraines   . PONV (postoperative nausea and vomiting)   . PTSD (post-traumatic stress disorder)   . Urine incontinence    Past Surgical History:  Procedure Laterality Date  . BREAST BIOPSY    . CESAREAN SECTION  1992  . COLONOSCOPY    . INCONTINENCE SURGERY    . LAPAROSCOPY     under bladder  . TOTAL KNEE ARTHROPLASTY Left 12/23/2015   Procedure: TOTAL KNEE ARTHROPLASTY;  Surgeon: Melrose Nakayama, MD;  Location: [facility];  Service: Orthopedics;  Laterality: Left;    reports that she  quit smoking about 6 years ago. Her smoking use included cigarettes. She has a 5.00 pack-year smoking history. she has never used smokeless tobacco. She reports that she does not drink alcohol or use drugs. family history includes Alcohol abuse in her other; Arthritis in her other; Drug abuse in her sister; Hypertension in her mother and other; Kidney cancer in her father; Mental illness in her other; Ulcers in her father. Allergies  Allergen Reactions  . Sulfa Antibiotics Hives     Review of Systems  Constitutional: Negative for activity change, appetite change, fatigue, fever and unexpected weight change.  HENT: Negative for ear pain, hearing loss, sore throat and trouble swallowing.   Eyes: Negative for visual disturbance.  Respiratory: Negative for cough and shortness of breath.   Cardiovascular: Negative for chest pain and palpitations.  Gastrointestinal: Negative for abdominal pain, blood in stool, constipation and diarrhea.  Endocrine: Negative for polydipsia and polyuria.  Genitourinary: Negative for dysuria and hematuria.  Musculoskeletal: Positive for arthralgias. Negative for back pain and myalgias.  Skin: Negative for rash.  Neurological: Negative for dizziness, syncope and headaches.  Hematological: Negative for adenopathy.  Psychiatric/Behavioral: Negative for confusion and dysphoric mood.       Objective:   Physical Exam  Constitutional: She is oriented to person, place, and time. She appears well-developed and well-nourished.  HENT:  Head: Normocephalic and atraumatic.  Eyes: EOM are normal. Pupils are equal, round, and reactive to light.  Neck: Normal range of motion. Neck supple. No thyromegaly present.  Cardiovascular: Normal rate, regular rhythm and normal heart sounds.  No murmur heard. Pulmonary/Chest: Breath sounds normal. No respiratory distress. She has no wheezes. She has no rales.  Abdominal: Soft. Bowel sounds are normal. She exhibits no distension and  no mass. There is no tenderness. There is no rebound and no guarding.  Musculoskeletal: Normal range of motion. She exhibits no edema.  Lymphadenopathy:    She has no cervical adenopathy.  Neurological: She is alert and oriented to person, place, and time. She displays normal reflexes. No cranial nerve deficit.  Skin: No rash noted.  Psychiatric: She has a normal mood and affect. Her behavior is normal. Judgment and thought content normal.       Assessment:     Physical exam. Several issues addressed as below    Plan:     -Check hepatitis C antibody -Scheduled already for colonoscopy tomorrow -Tetanus booster given -Discuss new shingles vaccine and she will check on insurance coverage first -check labs with basic metabolic panel, E1R, and lipid panel -Consider repeat DEXA scan by next year  Eulas Post MD [facility]

## 2017-05-03 ENCOUNTER — Encounter: Payer: Self-pay | Admitting: Internal Medicine

## 2017-05-03 ENCOUNTER — Ambulatory Visit (AMBULATORY_SURGERY_CENTER): Payer: BLUE CROSS/BLUE SHIELD | Admitting: Internal Medicine

## 2017-05-03 ENCOUNTER — Other Ambulatory Visit: Payer: Self-pay

## 2017-05-03 VITALS — BP 116/75 | HR 68 | Temp 96.8°F | Resp 17 | Ht 64.0 in | Wt 151.0 lb

## 2017-05-03 DIAGNOSIS — D125 Benign neoplasm of sigmoid colon: Secondary | ICD-10-CM

## 2017-05-03 DIAGNOSIS — D122 Benign neoplasm of ascending colon: Secondary | ICD-10-CM

## 2017-05-03 DIAGNOSIS — K635 Polyp of colon: Secondary | ICD-10-CM | POA: Diagnosis not present

## 2017-05-03 DIAGNOSIS — D123 Benign neoplasm of transverse colon: Secondary | ICD-10-CM | POA: Diagnosis not present

## 2017-05-03 DIAGNOSIS — Z86010 Personal history of colonic polyps: Secondary | ICD-10-CM

## 2017-05-03 MED ORDER — HYDROCORTISONE ACE-PRAMOXINE 1-1 % RE CREA: 1.0000 "application " | TOPICAL_CREAM | Freq: Two times a day (BID) | RECTAL | 1 refills | Status: DC

## 2017-05-03 MED ORDER — SODIUM CHLORIDE 0.9 % IV SOLN: 500.0000 mL | INTRAVENOUS | Status: DC

## 2017-05-03 NOTE — Progress Notes (Signed)
1359 upon arrival to recovery pt. Stating that her stomach hurts and was restless explained to pt. That the discomfort was coming from air inserted into abdomen during procedure and she needed to expel air. Abdomen soft non distended upon arrival to recovery. Pt placed on all 4 to help in expelling air and she began expelling small amount but continued to complain of air on right side, Dr. Tonye Royalty made aware 0.125mg  hyoscyamine SL administered per order and pt repostioned to right side and pt. Expelled more air and stated it feels better. Pt. Ambulated to bathroom and after being in bathroom she stated I am ready to go and she stated that she feels better. Pt. Discharge to home per order.

## 2017-05-03 NOTE — Progress Notes (Signed)
Pt's states no medical or surgical changes since previsit or office visit. 

## 2017-05-03 NOTE — Op Note (Signed)
[facility] Patient Name: Gabriela Shepard Procedure Date: 05/03/2017 1:23 PM MRN: [id number] Endoscopist: Jerene Bears , MD Age: 56 Referring MD:  Date of Birth: [birth year] Gender: Female Account #: [id number] Procedure:                Colonoscopy Indications:              High risk colon cancer surveillance: Personal                            history of sessile serrated colon polyp (less than                            10 mm in size) with no dysplasia, Last colonoscopy:                            2012 Medicines:                Monitored Anesthesia Care Procedure:                Pre-Anesthesia Assessment:                           - Prior to the procedure, a History and Physical                            was performed, and patient medications and                            allergies were reviewed. The patient's tolerance of                            previous anesthesia was also reviewed. The risks                            and benefits of the procedure and the sedation                            options and risks were discussed with the patient.                            All questions were answered, and informed consent                            was obtained. Prior Anticoagulants: The patient has                            taken no previous anticoagulant or antiplatelet                            agents. ASA Grade Assessment: II - A patient with                            mild systemic disease. After reviewing the risks  and benefits, the patient was deemed in                            satisfactory condition to undergo the procedure.                           After obtaining informed consent, the colonoscope                            was passed under direct vision. Throughout the                            procedure, the patient's blood pressure, pulse, and                            oxygen saturations were monitored continuously. The                         Model PCF-H190DL [id number]) scope was introduced                            through the anus and advanced to the the cecum,                            identified by appendiceal orifice and ileocecal                            valve. The colonoscopy was performed without                            difficulty. The patient tolerated the procedure                            well. The quality of the bowel preparation was                            good. The ileocecal valve, appendiceal orifice, and                            rectum were photographed. Scope In: 1:34:34 PM Scope Out: 1:54:55 PM Scope Withdrawal Time: 0 hours 16 minutes 30 seconds  Total Procedure Duration: 0 hours 20 minutes 21 seconds  Findings:                 The digital rectal exam was normal.                           Two flat polyps were found in the ascending colon.                            The polyps were 5 to 8 mm in size. These polyps                            were removed with a cold snare. Resection and  retrieval were complete.                           A 5 mm polyp was found in the proximal transverse                            colon. The polyp was sessile. The polyp was removed                            with a cold snare. Resection and retrieval were                            complete.                           A 5 mm polyp was found in the distal sigmoid colon.                            The polyp was sessile. The polyp was removed with a                            cold snare. Resection and retrieval were complete.                           The exam was otherwise without abnormality on                            direct and retroflexion views. Complications:            No immediate complications. Estimated Blood Loss:     Estimated blood loss was minimal. Impression:               - Two 5 to 8 mm polyps in the ascending colon,                             removed with a cold snare. Resected and retrieved.                           - One 5 mm polyp in the proximal transverse colon,                            removed with a cold snare. Resected and retrieved.                           - One 5 mm polyp in the distal sigmoid colon,                            removed with a cold snare. Resected and retrieved.                           - The examination was otherwise normal on direct  and retroflexion views. Recommendation:           - Patient has a contact number available for                            emergencies. The signs and symptoms of potential                            delayed complications were discussed with the                            patient. Return to normal activities tomorrow.                            Written discharge instructions were provided to the                            patient.                           - Resume previous diet.                           - Continue present medications.                           - Await pathology results.                           - Repeat colonoscopy is recommended for                            surveillance. The colonoscopy date will be                            determined after pathology results from today's                            exam become available for review. Jerene Bears, MD 05/03/2017 1:59:05 PM This report has been signed electronically.

## 2017-05-03 NOTE — Patient Instructions (Signed)
YOU HAD AN ENDOSCOPIC PROCEDURE TODAY AT [facility]:   Refer to the procedure report that was given to you for any specific questions about what was found during the examination.  If the procedure report does not answer your questions, please call your gastroenterologist to clarify.  If you requested that your care partner not be given the details of your procedure findings, then the procedure report has been included in a sealed envelope for you to review at your convenience later.  YOU SHOULD EXPECT: Some feelings of bloating in the abdomen. Passage of more gas than usual.  Walking can help get rid of the air that was put into your GI tract during the procedure and reduce the bloating. If you had a lower endoscopy (such as a colonoscopy or flexible sigmoidoscopy) you may notice spotting of blood in your stool or on the toilet paper. If you underwent a bowel prep for your procedure, you may not have a normal bowel movement for a few days.  Please Note:  You might notice some irritation and congestion in your nose or some drainage.  This is from the oxygen used during your procedure.  There is no need for concern and it should clear up in a day or so.  SYMPTOMS TO REPORT IMMEDIATELY:   Following lower endoscopy (colonoscopy or flexible sigmoidoscopy):  Excessive amounts of blood in the stool  Significant tenderness or worsening of abdominal pains  Swelling of the abdomen that is new, acute  Fever of 100F or higher   Following upper endoscopy (EGD)  Vomiting of blood or coffee ground material  New chest pain or pain under the shoulder blades  Painful or persistently difficult swallowing  New shortness of breath  Fever of 100F or higher  Black, tarry-looking stools  For urgent or emergent issues, a gastroenterologist can be reached at any hour by calling [phone].   DIET:  We do recommend a small meal at first, but then you may proceed to your regular diet.  Drink  plenty of fluids but you should avoid alcoholic beverages for 24 hours.  ACTIVITY:  You should plan to take it easy for the rest of today and you should NOT DRIVE or use heavy machinery until tomorrow (because of the sedation medicines used during the test).    FOLLOW UP: Our staff will call the number listed on your records the next business day following your procedure to check on you and address any questions or concerns that you may have regarding the information given to you following your procedure. If we do not reach you, we will leave a message.  However, if you are feeling well and you are not experiencing any problems, there is no need to return our call.  We will assume that you have returned to your regular daily activities without incident.  If any biopsies were taken you will be contacted by phone or by letter within the next 1-3 weeks.  Please call us at [phone] if you have not heard about the biopsies in 3 weeks.   SIGNATURES/CONFIDENTIALITY: You and/or your care partner have signed paperwork which will be entered into your electronic medical record.  These signatures attest to the fact that that the information above on your After Visit Summary has been reviewed and is understood.  Full responsibility of the confidentiality of this discharge information lies with you and/or your care-partner.  Await pathology  Please read over handout about polyps  Continue your  normal medications

## 2017-05-03 NOTE — Progress Notes (Signed)
Report to PACU, RN, vss, BBS= Clear.  

## 2017-05-03 NOTE — Progress Notes (Signed)
Called to room to assist during endoscopic procedure.  Patient ID and intended procedure confirmed with present staff. Received instructions for my participation in the procedure from the performing physician.  

## 2017-05-04 ENCOUNTER — Telehealth: Payer: Self-pay | Admitting: *Deleted

## 2017-05-04 ENCOUNTER — Telehealth: Payer: Self-pay

## 2017-05-04 NOTE — Telephone Encounter (Signed)
Called # [phone] left a messaged we tried to reach pt for a follow up call. maw

## 2017-05-04 NOTE — Telephone Encounter (Signed)
  Follow up Call-  Call back number 05/03/2017  Post procedure Call Back phone  # [phone]  Permission to leave phone message Yes  Some recent data might be hidden     Patient questions:  Do you have a fever, pain , or abdominal swelling? No. Pain Score  0 *  Have you tolerated food without any problems? Yes.    Have you been able to return to your normal activities? Yes.    Do you have any questions about your discharge instructions: Diet   No. Medications  No. Follow up visit  No.  Do you have questions or concerns about your Care? No.  Actions: * If pain score is 4 or above: No action needed, pain <4.

## 2017-05-09 ENCOUNTER — Encounter: Payer: Self-pay | Admitting: Internal Medicine

## 2017-05-18 DIAGNOSIS — Z01812 Encounter for preprocedural laboratory examination: Secondary | ICD-10-CM | POA: Diagnosis not present

## 2017-05-23 ENCOUNTER — Other Ambulatory Visit: Payer: Self-pay | Admitting: Family Medicine

## 2017-05-23 DIAGNOSIS — M1612 Unilateral primary osteoarthritis, left hip: Secondary | ICD-10-CM | POA: Diagnosis not present

## 2017-05-23 NOTE — Telephone Encounter (Signed)
Refill once OK. 

## 2017-05-25 ENCOUNTER — Encounter: Payer: Self-pay | Admitting: Family Medicine

## 2017-05-25 MED ORDER — VALACYCLOVIR HCL 500 MG PO TABS: ORAL_TABLET | ORAL | 3 refills | Status: DC

## 2017-05-26 DIAGNOSIS — M1612 Unilateral primary osteoarthritis, left hip: Secondary | ICD-10-CM | POA: Diagnosis not present

## 2017-05-27 DIAGNOSIS — M1612 Unilateral primary osteoarthritis, left hip: Secondary | ICD-10-CM | POA: Diagnosis not present

## 2017-06-08 DIAGNOSIS — Z96642 Presence of left artificial hip joint: Secondary | ICD-10-CM | POA: Diagnosis not present

## 2017-06-08 DIAGNOSIS — M25552 Pain in left hip: Secondary | ICD-10-CM | POA: Diagnosis not present

## 2017-06-08 DIAGNOSIS — M1612 Unilateral primary osteoarthritis, left hip: Secondary | ICD-10-CM | POA: Diagnosis not present

## 2017-06-08 DIAGNOSIS — Z471 Aftercare following joint replacement surgery: Secondary | ICD-10-CM | POA: Diagnosis not present

## 2017-06-16 DIAGNOSIS — M1612 Unilateral primary osteoarthritis, left hip: Secondary | ICD-10-CM | POA: Diagnosis not present

## 2017-06-20 DIAGNOSIS — M1612 Unilateral primary osteoarthritis, left hip: Secondary | ICD-10-CM | POA: Diagnosis not present

## 2017-06-27 DIAGNOSIS — M1612 Unilateral primary osteoarthritis, left hip: Secondary | ICD-10-CM | POA: Diagnosis not present

## 2017-07-14 DIAGNOSIS — F411 Generalized anxiety disorder: Secondary | ICD-10-CM | POA: Diagnosis not present

## 2017-07-14 DIAGNOSIS — F4310 Post-traumatic stress disorder, unspecified: Secondary | ICD-10-CM | POA: Diagnosis not present

## 2017-07-14 DIAGNOSIS — F3340 Major depressive disorder, recurrent, in remission, unspecified: Secondary | ICD-10-CM | POA: Diagnosis not present

## 2017-07-18 ENCOUNTER — Encounter: Payer: Self-pay | Admitting: Family Medicine

## 2017-07-18 DIAGNOSIS — F3340 Major depressive disorder, recurrent, in remission, unspecified: Secondary | ICD-10-CM | POA: Diagnosis not present

## 2017-07-18 DIAGNOSIS — F411 Generalized anxiety disorder: Secondary | ICD-10-CM | POA: Diagnosis not present

## 2017-07-18 DIAGNOSIS — F4310 Post-traumatic stress disorder, unspecified: Secondary | ICD-10-CM | POA: Diagnosis not present

## 2017-07-20 DIAGNOSIS — F3340 Major depressive disorder, recurrent, in remission, unspecified: Secondary | ICD-10-CM | POA: Diagnosis not present

## 2017-07-20 DIAGNOSIS — F4310 Post-traumatic stress disorder, unspecified: Secondary | ICD-10-CM | POA: Diagnosis not present

## 2017-07-20 DIAGNOSIS — F411 Generalized anxiety disorder: Secondary | ICD-10-CM | POA: Diagnosis not present

## 2017-07-25 DIAGNOSIS — F411 Generalized anxiety disorder: Secondary | ICD-10-CM | POA: Diagnosis not present

## 2017-07-25 DIAGNOSIS — F3340 Major depressive disorder, recurrent, in remission, unspecified: Secondary | ICD-10-CM | POA: Diagnosis not present

## 2017-07-25 DIAGNOSIS — F4310 Post-traumatic stress disorder, unspecified: Secondary | ICD-10-CM | POA: Diagnosis not present

## 2017-08-04 DIAGNOSIS — F411 Generalized anxiety disorder: Secondary | ICD-10-CM | POA: Diagnosis not present

## 2017-08-04 DIAGNOSIS — F3340 Major depressive disorder, recurrent, in remission, unspecified: Secondary | ICD-10-CM | POA: Diagnosis not present

## 2017-08-04 DIAGNOSIS — F4310 Post-traumatic stress disorder, unspecified: Secondary | ICD-10-CM | POA: Diagnosis not present

## 2017-08-15 DIAGNOSIS — M1612 Unilateral primary osteoarthritis, left hip: Secondary | ICD-10-CM | POA: Diagnosis not present

## 2017-08-15 DIAGNOSIS — F3340 Major depressive disorder, recurrent, in remission, unspecified: Secondary | ICD-10-CM | POA: Diagnosis not present

## 2017-08-15 DIAGNOSIS — F4310 Post-traumatic stress disorder, unspecified: Secondary | ICD-10-CM | POA: Diagnosis not present

## 2017-08-15 DIAGNOSIS — F411 Generalized anxiety disorder: Secondary | ICD-10-CM | POA: Diagnosis not present

## 2017-08-22 ENCOUNTER — Encounter: Payer: Self-pay | Admitting: Family Medicine

## 2017-08-22 DIAGNOSIS — F4310 Post-traumatic stress disorder, unspecified: Secondary | ICD-10-CM | POA: Diagnosis not present

## 2017-08-22 DIAGNOSIS — F411 Generalized anxiety disorder: Secondary | ICD-10-CM | POA: Diagnosis not present

## 2017-08-22 DIAGNOSIS — F3340 Major depressive disorder, recurrent, in remission, unspecified: Secondary | ICD-10-CM | POA: Diagnosis not present

## 2017-08-23 ENCOUNTER — Ambulatory Visit (INDEPENDENT_AMBULATORY_CARE_PROVIDER_SITE_OTHER): Payer: BLUE CROSS/BLUE SHIELD | Admitting: Family Medicine

## 2017-08-23 ENCOUNTER — Encounter: Payer: Self-pay | Admitting: Family Medicine

## 2017-08-23 VITALS — BP 110/80 | HR 91 | Temp 98.2°F | Wt 152.8 lb

## 2017-08-23 DIAGNOSIS — J019 Acute sinusitis, unspecified: Secondary | ICD-10-CM

## 2017-08-23 MED ORDER — AMOXICILLIN-POT CLAVULANATE 875-125 MG PO TABS: 1.0000 | ORAL_TABLET | Freq: Two times a day (BID) | ORAL | 0 refills | Status: DC

## 2017-08-23 NOTE — Patient Instructions (Signed)

## 2017-08-23 NOTE — Progress Notes (Signed)
Subjective:     Patient ID: Gabriela Shepard, female   DOB: [birth year], 57 y.o.   MRN: [id number]  HPI Patient is here with concerns that she may have sinus infection. She states since her early February she's had some increased malaise, intermittent headaches, posterior neck pain, intermittent cough, sinus pressure and pain. Similar symptoms with sinusitis in the past. No fever. Occasional postnasal drip. She states that the symptoms started after her 80 pound dog ran into her nose around early February. She did some nosebleed briefly. She tried saline nasal irrigation without improvement.  Past Medical History:  Diagnosis Date  . Arthritis   . Bowel habit changes    been going on couple years  . Depression   . Flatulence    excessive with strong odor/uncontrollable  . Headache   . 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 History of UTI   . Migraines   . PONV (postoperative nausea and vomiting)   . PTSD (post-traumatic stress disorder)   . Urine incontinence    Past Surgical History:  Procedure Laterality Date  . BREAST BIOPSY    . CESAREAN SECTION  1992  . COLONOSCOPY    . INCONTINENCE SURGERY    . LAPAROSCOPY     under bladder  . TOTAL KNEE ARTHROPLASTY Left 12/23/2015   Procedure: TOTAL KNEE ARTHROPLASTY;  Surgeon: Melrose Nakayama, MD;  Location: [place];  Service: Orthopedics;  Laterality: Left;    reports that she quit smoking about 6 years ago. Her smoking use included cigarettes. She has a 5.00 pack-year smoking history. she has never used smokeless tobacco. She reports that she does not drink alcohol or use drugs. family history includes Alcohol abuse in her other; Arthritis in her other; Drug abuse in her sister; Hypertension in her mother and other; Kidney cancer in her father; Mental illness in her other; Ulcers in her father. Allergies  Allergen Reactions  . Sulfa Antibiotics Hives     Review of Systems  Constitutional: Positive for fatigue.  Negative for chills and fever.  HENT: Positive for congestion, sinus pressure and sinus pain.   Respiratory: Positive for cough. Negative for shortness of breath and wheezing.   Neurological: Positive for headaches.       Objective:   Physical Exam  Constitutional: She appears well-developed and well-nourished.  HENT:  Right Ear: External ear normal.  Left Ear: External ear normal.  Mouth/Throat: Oropharynx is clear and moist.  Nasal mucosa erythematous otherwise clear  Neck: Neck supple.  Cardiovascular: Normal rate and regular rhythm.  Pulmonary/Chest: Effort normal and breath sounds normal. No respiratory distress. She has no wheezes. She has no rales.  Lymphadenopathy:    She has no cervical adenopathy.       Assessment:     Probable acute sinusitis.    Plan:     -Given duration of symptoms, start Augmentin 875 mg twice daily and take with food for 10 days -Continue saline nasal irrigation -Touch base if not improved in 2 weeks  Eulas Post MD  Primary [facility] at [facility]

## 2017-08-29 DIAGNOSIS — F411 Generalized anxiety disorder: Secondary | ICD-10-CM | POA: Diagnosis not present

## 2017-08-29 DIAGNOSIS — F4310 Post-traumatic stress disorder, unspecified: Secondary | ICD-10-CM | POA: Diagnosis not present

## 2017-08-29 DIAGNOSIS — F3340 Major depressive disorder, recurrent, in remission, unspecified: Secondary | ICD-10-CM | POA: Diagnosis not present

## 2017-09-06 DIAGNOSIS — F3340 Major depressive disorder, recurrent, in remission, unspecified: Secondary | ICD-10-CM | POA: Diagnosis not present

## 2017-09-06 DIAGNOSIS — F4310 Post-traumatic stress disorder, unspecified: Secondary | ICD-10-CM | POA: Diagnosis not present

## 2017-09-06 DIAGNOSIS — F411 Generalized anxiety disorder: Secondary | ICD-10-CM | POA: Diagnosis not present

## 2017-09-14 DIAGNOSIS — F411 Generalized anxiety disorder: Secondary | ICD-10-CM | POA: Diagnosis not present

## 2017-09-14 DIAGNOSIS — F3340 Major depressive disorder, recurrent, in remission, unspecified: Secondary | ICD-10-CM | POA: Diagnosis not present

## 2017-09-14 DIAGNOSIS — F4310 Post-traumatic stress disorder, unspecified: Secondary | ICD-10-CM | POA: Diagnosis not present

## 2017-09-19 DIAGNOSIS — F411 Generalized anxiety disorder: Secondary | ICD-10-CM | POA: Diagnosis not present

## 2017-09-19 DIAGNOSIS — F4310 Post-traumatic stress disorder, unspecified: Secondary | ICD-10-CM | POA: Diagnosis not present

## 2017-09-19 DIAGNOSIS — F3340 Major depressive disorder, recurrent, in remission, unspecified: Secondary | ICD-10-CM | POA: Diagnosis not present

## 2017-09-21 DIAGNOSIS — M4722 Other spondylosis with radiculopathy, cervical region: Secondary | ICD-10-CM | POA: Diagnosis not present

## 2017-09-22 DIAGNOSIS — F3340 Major depressive disorder, recurrent, in remission, unspecified: Secondary | ICD-10-CM | POA: Diagnosis not present

## 2017-09-22 DIAGNOSIS — F4310 Post-traumatic stress disorder, unspecified: Secondary | ICD-10-CM | POA: Diagnosis not present

## 2017-09-22 DIAGNOSIS — F411 Generalized anxiety disorder: Secondary | ICD-10-CM | POA: Diagnosis not present

## 2017-10-04 DIAGNOSIS — M542 Cervicalgia: Secondary | ICD-10-CM | POA: Diagnosis not present

## 2017-10-04 DIAGNOSIS — M5412 Radiculopathy, cervical region: Secondary | ICD-10-CM | POA: Diagnosis not present

## 2017-10-04 DIAGNOSIS — M62838 Other muscle spasm: Secondary | ICD-10-CM | POA: Diagnosis not present

## 2017-10-05 ENCOUNTER — Encounter: Payer: Self-pay | Admitting: Family Medicine

## 2017-10-05 ENCOUNTER — Ambulatory Visit (INDEPENDENT_AMBULATORY_CARE_PROVIDER_SITE_OTHER): Payer: BLUE CROSS/BLUE SHIELD | Admitting: Family Medicine

## 2017-10-05 VITALS — BP 120/80 | HR 70 | Temp 97.7°F | Wt 155.5 lb

## 2017-10-05 DIAGNOSIS — M62838 Other muscle spasm: Secondary | ICD-10-CM | POA: Diagnosis not present

## 2017-10-05 DIAGNOSIS — M5412 Radiculopathy, cervical region: Secondary | ICD-10-CM | POA: Diagnosis not present

## 2017-10-05 DIAGNOSIS — Z780 Asymptomatic menopausal state: Secondary | ICD-10-CM

## 2017-10-05 DIAGNOSIS — M542 Cervicalgia: Secondary | ICD-10-CM | POA: Diagnosis not present

## 2017-10-05 MED ORDER — CONJ ESTROG-MEDROXYPROGEST ACE 0.625-5 MG PO TABS: 1.0000 | ORAL_TABLET | Freq: Every day | ORAL | 3 refills | Status: DC

## 2017-10-05 NOTE — Progress Notes (Signed)
Subjective:     Patient ID: Gabriela Shepard, female   DOB: 1961/04/29, 57 y.o.   MRN: [id number]  HPI Patient here to discuss postmenopausal hormone replacement. She's been postmenopausal for a few years now. She still has some hot flashes. More than the hot flashes though she is concerned about some issues with general malaise and some vaginal drying and also weight gain. She has increased fatigue issues. She is convinced a lot of these symptoms are related to estrogen deficiency  She seen gynecologist in the past and states her last Pap was about 2 years ago. She gets yearly mammograms. She does have history of migraine headaches but these have been very stable with monthly injectable therapy.  She is a nonsmoker. No family history of clotting disorder and she's never had any clotting issues. No family history of breast cancer. No history of liver disease. No cardiovascular disease history. No history of diabetes.  No history of any recent vaginal spotting.  Past Medical History:  Diagnosis Date  . Arthritis   . Bowel habit changes    been going on couple years  . Depression   . Flatulence    excessive with strong odor/uncontrollable  . Headache   . 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 History of UTI   . Migraines   . PONV (postoperative nausea and vomiting)   . PTSD (post-traumatic stress disorder)   . Urine incontinence    Past Surgical History:  Procedure Laterality Date  . BREAST BIOPSY    . CESAREAN SECTION  1992  . COLONOSCOPY    . INCONTINENCE SURGERY    . LAPAROSCOPY     under bladder  . TOTAL KNEE ARTHROPLASTY Left 12/23/2015   Procedure: TOTAL KNEE ARTHROPLASTY;  Surgeon: Melrose Nakayama, MD;  Location: [place];  Service: Orthopedics;  Laterality: Left;    reports that she quit smoking about 6 years ago. Her smoking use included cigarettes. She has a 5.00 pack-year smoking history. She has never used smokeless tobacco. She reports that she does  not drink alcohol or use drugs. family history includes Alcohol abuse in her other; Arthritis in her other; Drug abuse in her sister; Hypertension in her mother and other; Kidney cancer in her father; Mental illness in her other; Ulcers in her father. Allergies  Allergen Reactions  . Sulfa Antibiotics Hives    Review of Systems  Constitutional: Negative for appetite change and unexpected weight change.  Respiratory: Negative for shortness of breath.   Cardiovascular: Negative for chest pain, palpitations and leg swelling.  Gastrointestinal: Negative for abdominal pain.  Genitourinary: Negative for dysuria, menstrual problem and vaginal bleeding.  Neurological: Negative for headaches.       Objective:   Physical Exam  Constitutional: She appears well-developed and well-nourished.  Cardiovascular: Normal rate, regular rhythm and normal heart sounds.  No murmur heard. Pulmonary/Chest: Effort normal and breath sounds normal. She has no wheezes. She has no rales.  Musculoskeletal: She exhibits no edema.       Assessment:     Postmenopausal symptoms.    Plan:     -We had long discussion regarding pros and cons of postmenopausal estrogen therapy. She has uterus and we explained would not advise unopposed estrogen. We discussed risks including increased risk of DVT and risk of stroke-although low. We also explained risk of caution with history of migraine headache but her migraines are basically not occurring now with prophylaxis with Aimovig.  We also discussed that we do not recommend long-term use of these medications anymore at this point -We agreed to cautious trial of estrogen progesterone combination pill and reviewed potential side effects. Follow-up immediately for any vaginal spotting or other concerns. Also be in touch if she has any recurrent migraines  Eulas Post MD [facility]

## 2017-10-05 NOTE — Patient Instructions (Signed)

## 2017-10-06 DIAGNOSIS — F3340 Major depressive disorder, recurrent, in remission, unspecified: Secondary | ICD-10-CM | POA: Diagnosis not present

## 2017-10-06 DIAGNOSIS — F411 Generalized anxiety disorder: Secondary | ICD-10-CM | POA: Diagnosis not present

## 2017-10-06 DIAGNOSIS — F4310 Post-traumatic stress disorder, unspecified: Secondary | ICD-10-CM | POA: Diagnosis not present

## 2017-10-07 DIAGNOSIS — M62838 Other muscle spasm: Secondary | ICD-10-CM | POA: Diagnosis not present

## 2017-10-07 DIAGNOSIS — M542 Cervicalgia: Secondary | ICD-10-CM | POA: Diagnosis not present

## 2017-10-07 DIAGNOSIS — M5412 Radiculopathy, cervical region: Secondary | ICD-10-CM | POA: Diagnosis not present

## 2017-10-11 DIAGNOSIS — M542 Cervicalgia: Secondary | ICD-10-CM | POA: Diagnosis not present

## 2017-10-11 DIAGNOSIS — M62838 Other muscle spasm: Secondary | ICD-10-CM | POA: Diagnosis not present

## 2017-10-11 DIAGNOSIS — M5412 Radiculopathy, cervical region: Secondary | ICD-10-CM | POA: Diagnosis not present

## 2017-10-13 DIAGNOSIS — M62838 Other muscle spasm: Secondary | ICD-10-CM | POA: Diagnosis not present

## 2017-10-13 DIAGNOSIS — F4310 Post-traumatic stress disorder, unspecified: Secondary | ICD-10-CM | POA: Diagnosis not present

## 2017-10-13 DIAGNOSIS — M5412 Radiculopathy, cervical region: Secondary | ICD-10-CM | POA: Diagnosis not present

## 2017-10-13 DIAGNOSIS — M542 Cervicalgia: Secondary | ICD-10-CM | POA: Diagnosis not present

## 2017-10-13 DIAGNOSIS — F411 Generalized anxiety disorder: Secondary | ICD-10-CM | POA: Diagnosis not present

## 2017-10-13 DIAGNOSIS — F3340 Major depressive disorder, recurrent, in remission, unspecified: Secondary | ICD-10-CM | POA: Diagnosis not present

## 2017-10-17 DIAGNOSIS — M5412 Radiculopathy, cervical region: Secondary | ICD-10-CM | POA: Diagnosis not present

## 2017-10-17 DIAGNOSIS — M542 Cervicalgia: Secondary | ICD-10-CM | POA: Diagnosis not present

## 2017-10-17 DIAGNOSIS — M62838 Other muscle spasm: Secondary | ICD-10-CM | POA: Diagnosis not present

## 2017-10-18 DIAGNOSIS — F411 Generalized anxiety disorder: Secondary | ICD-10-CM | POA: Diagnosis not present

## 2017-10-18 DIAGNOSIS — F3340 Major depressive disorder, recurrent, in remission, unspecified: Secondary | ICD-10-CM | POA: Diagnosis not present

## 2017-10-18 DIAGNOSIS — F4310 Post-traumatic stress disorder, unspecified: Secondary | ICD-10-CM | POA: Diagnosis not present

## 2017-10-19 DIAGNOSIS — M5412 Radiculopathy, cervical region: Secondary | ICD-10-CM | POA: Diagnosis not present

## 2017-10-19 DIAGNOSIS — M4722 Other spondylosis with radiculopathy, cervical region: Secondary | ICD-10-CM | POA: Diagnosis not present

## 2017-10-19 DIAGNOSIS — M62838 Other muscle spasm: Secondary | ICD-10-CM | POA: Diagnosis not present

## 2017-10-19 DIAGNOSIS — M542 Cervicalgia: Secondary | ICD-10-CM | POA: Diagnosis not present

## 2017-10-21 DIAGNOSIS — M542 Cervicalgia: Secondary | ICD-10-CM | POA: Diagnosis not present

## 2017-10-21 DIAGNOSIS — M5412 Radiculopathy, cervical region: Secondary | ICD-10-CM | POA: Diagnosis not present

## 2017-10-21 DIAGNOSIS — M62838 Other muscle spasm: Secondary | ICD-10-CM | POA: Diagnosis not present

## 2017-10-24 ENCOUNTER — Encounter: Payer: Self-pay | Admitting: Family Medicine

## 2017-10-24 ENCOUNTER — Ambulatory Visit (INDEPENDENT_AMBULATORY_CARE_PROVIDER_SITE_OTHER): Payer: BLUE CROSS/BLUE SHIELD | Admitting: Family Medicine

## 2017-10-24 VITALS — BP 132/74 | HR 89 | Temp 98.6°F | Ht 64.0 in | Wt 151.0 lb

## 2017-10-24 DIAGNOSIS — B9789 Other viral agents as the cause of diseases classified elsewhere: Secondary | ICD-10-CM

## 2017-10-24 DIAGNOSIS — J069 Acute upper respiratory infection, unspecified: Secondary | ICD-10-CM | POA: Diagnosis not present

## 2017-10-24 DIAGNOSIS — J3489 Other specified disorders of nose and nasal sinuses: Secondary | ICD-10-CM

## 2017-10-24 MED ORDER — HYDROCODONE-HOMATROPINE 5-1.5 MG/5ML PO SYRP
5.0000 mL | ORAL_SOLUTION | Freq: Every evening | ORAL | 0 refills | Status: DC | PRN
Start: 2017-10-24 — End: 2017-11-16

## 2017-10-24 MED ORDER — AZITHROMYCIN 250 MG PO TABS: ORAL_TABLET | ORAL | 0 refills | Status: DC

## 2017-10-24 NOTE — Patient Instructions (Addendum)
You can take Delsym for your cough.  It can be found over the counter at the drug store.  Viral Respiratory Infection A respiratory infection is an illness that affects part of the respiratory system, such as the lungs, nose, or throat. Most respiratory infections are caused by either viruses or bacteria. A respiratory infection that is caused by a virus is called a viral respiratory infection. Common types of viral respiratory infections include:  A cold.  The flu (influenza).  A respiratory syncytial virus (RSV) infection.  How do I know if I have a viral respiratory infection? Most viral respiratory infections cause:  A stuffy or runny nose.  Yellow or green nasal discharge.  A cough.  Sneezing.  Fatigue.  Achy muscles.  A sore throat.  Sweating or chills.  A fever.  A headache.  How are viral respiratory infections treated? If influenza is diagnosed early, it may be treated with an antiviral medicine that shortens the length of time a person has symptoms. Symptoms of viral respiratory infections may be treated with over-the-counter and prescription medicines, such as:  Expectorants. These make it easier to cough up mucus.  Decongestant nasal sprays.  Health care providers do not prescribe antibiotic medicines for viral infections. This is because antibiotics are designed to kill bacteria. They have no effect on viruses. How do I know if I should stay home from work or school? To avoid exposing others to your respiratory infection, stay home if you have:  A fever.  A persistent cough.  A sore throat.  A runny nose.  Sneezing.  Muscles aches.  Headaches.  Fatigue.  Weakness.  Chills.  Sweating.  Nausea.  Follow these instructions at home:  Rest as much as possible.  Take over-the-counter and prescription medicines only as told by your health care provider.  Drink enough fluid to keep your urine clear or pale yellow. This helps prevent  dehydration and helps loosen up mucus.  Gargle with a salt-water mixture 3-4 times per day or as needed. To make a salt-water mixture, completely dissolve -1 tsp of salt in 1 cup of warm water.  Use nose drops made from salt water to ease congestion and soften raw skin around your nose.  Do not drink alcohol.  Do not use tobacco products, including cigarettes, chewing tobacco, and e-cigarettes. If you need help quitting, ask your health care provider. Contact a health care provider if:  Your symptoms last for 10 days or longer.  Your symptoms get worse over time.  You have a fever.  You have severe sinus pain in your face or forehead.  The glands in your jaw or neck become very swollen. Get help right away if:  You feel pain or pressure in your chest.  You have shortness of breath.  You faint or feel like you will faint.  You have severe and persistent vomiting.  You feel confused or disoriented. This information is not intended to replace advice given to you by your health care provider. Make sure you discuss any questions you have with your health care provider. Document Released: 03/03/2005 Document Revised: 10/30/2015 Document Reviewed: 10/30/2014 Elsevier Interactive Patient Education  [employer].

## 2017-10-24 NOTE — Progress Notes (Signed)
Subjective:    Patient ID: Gabriela Shepard, female    DOB: [birth year], 57 y.o.   MRN: [id number]  Chief Complaint  Patient presents with  . Cough    Ongoing for three days-admits to nonproductive cough-worse at night has been taking nyquil-admits to shortness of breath-admits to low grade fever    HPI Patient was seen today for acute concern.  Pt endorses dry cough, sore throat, stuffy nose which started the middle of last week but became progressively worse on Friday.  Pt is unsure if she has had fever but endorses chills.  Pt has taken DayQuil, NyQuil, Flonase, Benadryl and she also endorses allergies.  Pt endorses increased coughing with speaking.  She states she has a work trip in [place] this afternoon and she has to be ready to give a presentation.  Past Medical History:  Diagnosis Date  . Arthritis   . Bowel habit changes    been going on couple years  . Depression   . Flatulence    excessive with strong odor/uncontrollable  . Headache   . 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 History of UTI   . Migraines   . PONV (postoperative nausea and vomiting)   . PTSD (post-traumatic stress disorder)   . Urine incontinence     Allergies  Allergen Reactions  . Sulfa Antibiotics Hives    ROS General: Denies fever, night sweats, changes in weight, changes in appetite   +chills HEENT: Denies headaches, ear pain, changes in vision, rhinorrhea  +nasal congestion, sore throat, ear pressure, facial pressure CV: Denies CP, palpitations, SOB, orthopnea Pulm: Denies SOB, wheezing  +cough GI: Denies abdominal pain, nausea, vomiting, diarrhea, constipation GU: Denies dysuria, hematuria, frequency, vaginal discharge Msk: Denies muscle cramps, joint pains Neuro: Denies weakness, numbness, tingling Skin: Denies rashes, bruising Psych: Denies depression, anxiety, hallucinations     Objective:    Blood pressure 132/74, pulse 89, temperature 98.6 F (37 C),  temperature source Oral, height 5\' 4"  (1.626 m), weight 151 lb (68.5 kg), SpO2 95 %.   Gen. Pleasant, well-nourished, in no distress, normal affect   HEENT: Evans/AT, face symmetric, no scleral icterus, PERRLA, nares patent without drainage, pharynx without erythema or exudate. Lungs: coughing, no accessory muscle use, CTAB, no wheezes or rales Cardiovascular: RRR, no m/r/g, no peripheral edema Abdomen: BS present, soft, NT/ND Neuro:  A&Ox3, CN II-XII intact, normal gait   Wt Readings from Last 3 Encounters:  10/24/17 151 lb (68.5 kg)  10/05/17 155 lb 8 oz (70.5 kg)  08/23/17 152 lb 12.8 oz (69.3 kg)    Lab Results  Component Value Date   WBC 5.0 03/07/2017   HGB 12.9 03/07/2017   HCT 38.0 03/07/2017   PLT 334.0 03/07/2017   GLUCOSE 91 05/02/2017   CHOL 201 (H) 05/02/2017   TRIG 97.0 05/02/2017   HDL 62.20 05/02/2017   LDLCALC 120 (H) 05/02/2017   ALT 17 03/07/2017   AST 17 03/07/2017   NA 132 (L) 05/02/2017   K 4.4 05/02/2017   CL 96 05/02/2017   CREATININE 0.71 05/02/2017   BUN 13 05/02/2017   CO2 29 05/02/2017   TSH 1.55 03/07/2017   INR 1.01 12/11/2015   HGBA1C 5.9 05/02/2017    Assessment/Plan:  Viral URI with cough  -supportive care -given handout -ok to use Delsym during the day for cough. -Hycodan at night for cough as may make sleepy. - Plan: HYDROcodone-homatropine (HYCODAN) 5-1.5 MG/5ML syrup  Sinus pressure  -discussed a wait and see rx for azithromycin given pt's increasing facial/ear pressure. Discussed waiting 2-3 days if feeling worse then start medication. - Plan: azithromycin (ZITHROMAX) 250 MG tablet -discussed continuing Flonase.  F/u prn  Grier Mitts, MD

## 2017-10-28 ENCOUNTER — Encounter: Payer: Self-pay | Admitting: Family Medicine

## 2017-10-28 DIAGNOSIS — M542 Cervicalgia: Secondary | ICD-10-CM | POA: Diagnosis not present

## 2017-10-28 MED ORDER — BENZONATATE 100 MG PO CAPS: ORAL_CAPSULE | ORAL | 1 refills | Status: DC

## 2017-11-01 DIAGNOSIS — F4310 Post-traumatic stress disorder, unspecified: Secondary | ICD-10-CM | POA: Diagnosis not present

## 2017-11-01 DIAGNOSIS — M5412 Radiculopathy, cervical region: Secondary | ICD-10-CM | POA: Diagnosis not present

## 2017-11-01 DIAGNOSIS — F3340 Major depressive disorder, recurrent, in remission, unspecified: Secondary | ICD-10-CM | POA: Diagnosis not present

## 2017-11-01 DIAGNOSIS — F411 Generalized anxiety disorder: Secondary | ICD-10-CM | POA: Diagnosis not present

## 2017-11-01 DIAGNOSIS — M62838 Other muscle spasm: Secondary | ICD-10-CM | POA: Diagnosis not present

## 2017-11-01 DIAGNOSIS — M542 Cervicalgia: Secondary | ICD-10-CM | POA: Diagnosis not present

## 2017-11-09 DIAGNOSIS — F411 Generalized anxiety disorder: Secondary | ICD-10-CM | POA: Diagnosis not present

## 2017-11-09 DIAGNOSIS — M4722 Other spondylosis with radiculopathy, cervical region: Secondary | ICD-10-CM | POA: Diagnosis not present

## 2017-11-09 DIAGNOSIS — F4310 Post-traumatic stress disorder, unspecified: Secondary | ICD-10-CM | POA: Diagnosis not present

## 2017-11-09 DIAGNOSIS — M62838 Other muscle spasm: Secondary | ICD-10-CM | POA: Diagnosis not present

## 2017-11-09 DIAGNOSIS — F3340 Major depressive disorder, recurrent, in remission, unspecified: Secondary | ICD-10-CM | POA: Diagnosis not present

## 2017-11-09 DIAGNOSIS — M542 Cervicalgia: Secondary | ICD-10-CM | POA: Diagnosis not present

## 2017-11-09 DIAGNOSIS — M5412 Radiculopathy, cervical region: Secondary | ICD-10-CM | POA: Diagnosis not present

## 2017-11-11 DIAGNOSIS — M62838 Other muscle spasm: Secondary | ICD-10-CM | POA: Diagnosis not present

## 2017-11-11 DIAGNOSIS — M5412 Radiculopathy, cervical region: Secondary | ICD-10-CM | POA: Diagnosis not present

## 2017-11-11 DIAGNOSIS — M542 Cervicalgia: Secondary | ICD-10-CM | POA: Diagnosis not present

## 2017-11-14 DIAGNOSIS — M5412 Radiculopathy, cervical region: Secondary | ICD-10-CM | POA: Diagnosis not present

## 2017-11-14 DIAGNOSIS — M25552 Pain in left hip: Secondary | ICD-10-CM | POA: Diagnosis not present

## 2017-11-15 DIAGNOSIS — F3340 Major depressive disorder, recurrent, in remission, unspecified: Secondary | ICD-10-CM | POA: Diagnosis not present

## 2017-11-15 DIAGNOSIS — F4310 Post-traumatic stress disorder, unspecified: Secondary | ICD-10-CM | POA: Diagnosis not present

## 2017-11-15 DIAGNOSIS — F411 Generalized anxiety disorder: Secondary | ICD-10-CM | POA: Diagnosis not present

## 2017-11-16 ENCOUNTER — Encounter: Payer: Self-pay | Admitting: Family Medicine

## 2017-11-16 ENCOUNTER — Ambulatory Visit (INDEPENDENT_AMBULATORY_CARE_PROVIDER_SITE_OTHER): Payer: BLUE CROSS/BLUE SHIELD | Admitting: Family Medicine

## 2017-11-16 VITALS — BP 120/80 | HR 86 | Temp 97.9°F | Wt 153.2 lb

## 2017-11-16 DIAGNOSIS — M2550 Pain in unspecified joint: Secondary | ICD-10-CM | POA: Diagnosis not present

## 2017-11-16 DIAGNOSIS — M4722 Other spondylosis with radiculopathy, cervical region: Secondary | ICD-10-CM | POA: Diagnosis not present

## 2017-11-16 DIAGNOSIS — M5412 Radiculopathy, cervical region: Secondary | ICD-10-CM | POA: Diagnosis not present

## 2017-11-16 NOTE — Progress Notes (Signed)
  Subjective:     Patient ID: Gabriela Shepard, female   DOB: [birth year], 57 y.o.   MRN: [id number]  HPI Patient seen to discuss recent arthritis issues. She's had multiple orthopedic procedures including prior left knee replacement, left total hip replacement, and some ongoing right hip issues. She states she had steroid injection of the hip per orthopedists recently. She brings in copy of MRI cervical spine that was done recently per orthopedics. She's had some progressive neck pain and intermittent pain radiating into the right upper extremity with some associated numbness. X-ray revealed moderate to severe foraminal narrowing at multiple levels from C3-C7 with worse right at C3-C4 and C5-C6.  Patient has multiple other joints that ache at times including her hands, wrist, elbows, hips. She's not seen objective evidence for inflammation such as swelling or redness or warmth. She is basically requesting some labs to rule out inflammatory arthritis. No known family history of inflammatory/autoimmune arthritis  Past Medical History:  Diagnosis Date  . Arthritis   . Bowel habit changes    been going on couple years  . Depression   . Flatulence    excessive with strong odor/uncontrollable  . Headache   . 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 History of UTI   . Migraines   . PONV (postoperative nausea and vomiting)   . PTSD (post-traumatic stress disorder)   . Urine incontinence    Past Surgical History:  Procedure Laterality Date  . BREAST BIOPSY    . CESAREAN SECTION  1992  . COLONOSCOPY    . INCONTINENCE SURGERY    . LAPAROSCOPY     under bladder  . 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 Her smoking use included cigarettes. She has a 5.00 pack-year smoking history. She has never  used smokeless tobacco. She reports that she does not drink alcohol or use drugs. family history includes Alcohol abuse in her other; Arthritis in her other; Drug abuse in her sister; Hypertension in her mother and other; Kidney cancer in her father; Mental illness in her other; Ulcers in her father. Allergies  Allergen Reactions  . Sulfa Antibiotics Hives     Review of Systems  Constitutional: Negative for appetite change, chills, fever and unexpected weight change.  Respiratory: Negative for shortness of breath.   Cardiovascular: Negative for chest pain.  Musculoskeletal: Positive for arthralgias, neck pain and neck stiffness. Negative for joint swelling.       Objective:   Physical Exam  Constitutional: She appears well-developed and well-nourished.  Cardiovascular: Normal rate and regular rhythm.  Pulmonary/Chest: Effort normal and breath sounds normal. She has no wheezes. She has no rales.  Musculoskeletal: She exhibits no edema.       Assessment:     Polyarthralgias. Suspect most likely degenerative. 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  Has had prior left knee and hip replacements    Plan:     -Obtain further labs with anti-nuclear antibody, sedimentation rate, C-reactive protein, rheumatoid factor, CCP antibody  -We discussed dietary factors and things that are both pro-inflammatory and also anti-inflammatory. Increase natural sources of Omega threes and reduce processed sugars  Eulas Post MD [facility]

## 2017-11-17 LAB — SEDIMENTATION RATE: Sed Rate: 37 mm/hr — ABNORMAL HIGH (ref 0–30)

## 2017-11-17 LAB — C-REACTIVE PROTEIN: CRP: 0.2 mg/dL — AB (ref 0.5–20.0)

## 2017-11-18 LAB — ANA: Anti Nuclear Antibody(ANA): NEGATIVE

## 2017-11-18 LAB — RHEUMATOID FACTOR: Rhuematoid fact SerPl-aCnc: <14 IU/mL (ref <14)

## 2017-11-18 LAB — CYCLIC CITRUL PEPTIDE ANTIBODY, IGG

## 2017-11-21 DIAGNOSIS — F4310 Post-traumatic stress disorder, unspecified: Secondary | ICD-10-CM | POA: Diagnosis not present

## 2017-11-21 DIAGNOSIS — F411 Generalized anxiety disorder: Secondary | ICD-10-CM | POA: Diagnosis not present

## 2017-11-21 DIAGNOSIS — F3340 Major depressive disorder, recurrent, in remission, unspecified: Secondary | ICD-10-CM | POA: Diagnosis not present

## 2017-11-22 ENCOUNTER — Encounter: Payer: Self-pay | Admitting: Family Medicine

## 2017-11-22 DIAGNOSIS — M4722 Other spondylosis with radiculopathy, cervical region: Secondary | ICD-10-CM

## 2017-11-28 NOTE — Progress Notes (Signed)
[name] [address], [place] [address] Phone: [phone] Subjective:    I'm seeing this patient by the request  of:  Eulas Post, MD   CC: neck pain  IFO:[id number]  Gabriela Shepard is a 57 y.o. female coming in with complaint of neck pain.  Has had pain going down the right arm.  Been worsening. Of a provider who told her that she may need surgical intervention.  Patient brought MRI it was independently visualized by me.  Found moderate to foraminal narrowing from C3-C6 fairly severe at C5-C6 and C6 6 7 on the right side.  Mild cervical stenosis noted. Patient states that the pain is daily.  Noticing increasing weakness of the hand.  Patient is concerned because she is starting to drop things.  Patient has not tried anything some mild formal physical therapy.  Wanting a second opinion to see if anything else can be done.       Past Medical History:  Diagnosis Date  . Arthritis   . Bowel habit changes    been going on couple years  . Depression   . Flatulence    excessive with strong odor/uncontrollable  . Headache   . 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 History of UTI   . Migraines   . PONV (postoperative nausea and vomiting)   . PTSD (post-traumatic stress disorder)   . Urine incontinence    Past Surgical History:  Procedure Laterality Date  . BREAST BIOPSY    . CESAREAN SECTION  1992  . COLONOSCOPY    . INCONTINENCE SURGERY    . LAPAROSCOPY     under bladder  . 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 Marital status: Divorced    Spouse name: Not on file  . Number of children: Not on file  . Years of education: Not on file  . Highest education level: Not on file  Occupational History  . Not on file  Social Needs  .  Financial resource strain: Not on file  . Food insecurity:    Worry: Not on file    Inability: Not on file  . Transportation needs:    Medical: Not on file    Non-medical: Not on file  Tobacco Use  . Smoking status: Former Smoker    Packs/day: 0.50    Years: 10.00    Pack years: 5.00    Types: Cigarettes    Last attempt to quit: 11/13/2010    Years since quitting: 7.0  . Smokeless tobacco: Never Used  Substance and Sexual Activity  . Alcohol use: No    Alcohol/week: 0.0 oz    Comment: pt denies.  . Drug use: No  . Sexual activity: Not on file  Lifestyle  . Physical activity:    Days per week: Not on file    Minutes per session: Not on file  . Stress: Not on file  Relationships  . Social connections:    Talks on phone: Not on file    Gets together: Not on file    Attends religious service: Not on file    Active member of club or organization: Not on file    Attends meetings of clubs or organizations: Not on file  Relationship status: Not on file  Other Topics Concern  . Not on file  Social History Narrative  . Not on file   Allergies  Allergen Reactions  . Sulfa Antibiotics Hives   Family History  Problem Relation Age of Onset  . Hypertension Mother   . Alcohol abuse Other   . Arthritis Other   . Hypertension Other   . Mental illness Other   . Kidney cancer Father   . Ulcers Father   . Drug abuse Sister   . Colon cancer Neg Hx      Past medical history, social, surgical and family history all reviewed in electronic medical record.  No pertanent information unless stated regarding to the chief complaint.   Review of Systems:Review of systems updated and as accurate as of 11/29/17  No headache, visual changes, nausea, vomiting, diarrhea, constipation, dizziness, abdominal pain, skin rash, fevers, chills, night sweats, weight loss, swollen lymph nodes, body aches, joint swelling, , chest pain, shortness of breath, mood changes.  Mild positive muscle aches and  muscle weakness  Objective  Blood pressure (!) 142/86, pulse 88, height 5' 4.5" (1.638 m), weight 152 lb (68.9 kg), SpO2 97 %. Systems examined below as of 11/29/17   General: No apparent distress alert and oriented x3 mood and affect normal, dressed appropriately.  HEENT: Pupils equal, extraocular movements intact  Respiratory: Patient's speak in full sentences and does not appear short of breath  Cardiovascular: No lower extremity edema, non tender, no erythema  Skin: Warm dry intact with no signs of infection or rash on extremities or on axial skeleton.  Abdomen: Soft nontender  Neuro: Cranial nerves II through XII are intact, neurovascularly intact in all extremities with 2+ DTRs and 2+ pulses.  Lymph: No lymphadenopathy of posterior or anterior cervical chain or axillae bilaterally.  Gait antalgic MSK:  Non tender with full range of motion and good stability and symmetric strength and tone of shoulders, elbows, wrist, hip, and ankles bilaterally.  Left knee replacement noted  Neck: Inspection loss of lordosis. No palpable stepoffs. Positive Spurling's maneuver. Limited range of motion in all planes especially with extension and right-sided side bending  Weakness noted in the C6 distribution on the right side No sensory change to C4 to T1 Negative Hoffman sign bilaterally Reflexes normal Tightness of the right trapezius  97110; 15 additional minutes spent for Therapeutic exercises as stated in above notes.  This included exercises focusing on stretching, strengthening, with significant focus on eccentric aspects.   Long term goals include an improvement in range of motion, strength, endurance as well as avoiding reinjury. Patient's frequency would include in 1-2 times a day, 3-5 times a week for a duration of 6-12 weeks. .Exercises that included:  Basic scapular stabilization to include adduction and depression of scapula Scaption, focusing on proper movement and good control Rows  with theraband which was given   Proper technique shown and discussed handout in great detail with ATC.  All questions were discussed and answered.     Impression and Recommendations:     This case required medical decision making of moderate complexity.      Note: This dictation was prepared with Dragon dictation along with smaller phrase technology. Any transcriptional errors that result from this process are unintentional.

## 2017-11-29 ENCOUNTER — Encounter: Payer: Self-pay | Admitting: Family Medicine

## 2017-11-29 ENCOUNTER — Ambulatory Visit (INDEPENDENT_AMBULATORY_CARE_PROVIDER_SITE_OTHER): Payer: BLUE CROSS/BLUE SHIELD | Admitting: Family Medicine

## 2017-11-29 DIAGNOSIS — M5412 Radiculopathy, cervical region: Secondary | ICD-10-CM | POA: Diagnosis not present

## 2017-11-29 MED ORDER — VITAMIN D (ERGOCALCIFEROL) 1.25 MG (50000 UNIT) PO CAPS: 50000.0000 [IU] | ORAL_CAPSULE | ORAL | 0 refills | Status: DC

## 2017-11-29 MED ORDER — GABAPENTIN 100 MG PO CAPS: 200.0000 mg | ORAL_CAPSULE | Freq: Every day | ORAL | 3 refills | Status: DC

## 2017-11-29 NOTE — Patient Instructions (Signed)
Good to see you  We will try hard Ice 20 minutes 2 times daily. Usually after activity and before bed. Exercises 3 times a week.   Keep hands within peripheral vision  Exercises 3 times a week.   pennsaid pinkie amount topically 2 times daily as needed. Can try on the knee  Once weekly vitamin D for 12 weeks Over the counter get  Turmeric 500mg  daily  Tart cherry extract any dose at night See me again in 3 weeks

## 2017-11-29 NOTE — Assessment & Plan Note (Signed)
Patient symptomatology and MRI do seem to find patient has likely nerve impingement.  Patient does not want any surgical intervention.  Would be that there is a possibility of epidurals.  Discussed with patient again at great length involvement at this moment we will try conservative therapy.  Patient was started on gabapentin at a low dose, new exercises for strengthening the upper back.  Discussed icing regimen and home exercises.  In addition to this we discussed the natural supplementations that could be beneficial.  Follow-up again in 3 weeks to see how patient is responding

## 2017-12-01 ENCOUNTER — Other Ambulatory Visit: Payer: Self-pay | Admitting: Family Medicine

## 2017-12-01 DIAGNOSIS — Z1231 Encounter for screening mammogram for malignant neoplasm of breast: Secondary | ICD-10-CM

## 2017-12-01 DIAGNOSIS — F411 Generalized anxiety disorder: Secondary | ICD-10-CM | POA: Diagnosis not present

## 2017-12-01 DIAGNOSIS — F4310 Post-traumatic stress disorder, unspecified: Secondary | ICD-10-CM | POA: Diagnosis not present

## 2017-12-01 DIAGNOSIS — F3340 Major depressive disorder, recurrent, in remission, unspecified: Secondary | ICD-10-CM | POA: Diagnosis not present

## 2017-12-03 ENCOUNTER — Other Ambulatory Visit: Payer: Self-pay | Admitting: Family Medicine

## 2017-12-05 NOTE — Telephone Encounter (Signed)
Refill OK

## 2017-12-05 NOTE — Telephone Encounter (Signed)
Last refill 05/02/18 and last office visit 11/16/17.  Okay to fill?

## 2017-12-06 DIAGNOSIS — F411 Generalized anxiety disorder: Secondary | ICD-10-CM | POA: Diagnosis not present

## 2017-12-06 DIAGNOSIS — F4310 Post-traumatic stress disorder, unspecified: Secondary | ICD-10-CM | POA: Diagnosis not present

## 2017-12-06 DIAGNOSIS — F3340 Major depressive disorder, recurrent, in remission, unspecified: Secondary | ICD-10-CM | POA: Diagnosis not present

## 2017-12-11 ENCOUNTER — Other Ambulatory Visit: Payer: Self-pay | Admitting: Neurology

## 2017-12-12 ENCOUNTER — Encounter: Payer: Self-pay | Admitting: Family Medicine

## 2017-12-12 DIAGNOSIS — M5412 Radiculopathy, cervical region: Secondary | ICD-10-CM | POA: Diagnosis not present

## 2017-12-12 DIAGNOSIS — M25562 Pain in left knee: Secondary | ICD-10-CM | POA: Diagnosis not present

## 2017-12-13 MED ORDER — ERENUMAB-AOOE 70 MG/ML ~~LOC~~ SOAJ: 70.0000 mL | SUBCUTANEOUS | 2 refills | Status: DC

## 2017-12-13 MED ORDER — SUMATRIPTAN SUCCINATE 100 MG PO TABS: ORAL_TABLET | ORAL | 4 refills | Status: DC

## 2017-12-16 ENCOUNTER — Telehealth: Payer: Self-pay | Admitting: *Deleted

## 2017-12-16 NOTE — Telephone Encounter (Signed)
Prior auth for Aimovig 70mg /ml sent to [email or web address] [id number].

## 2017-12-21 NOTE — Progress Notes (Signed)
[name] [address], [place] [address] Phone: [phone] Subjective:     CC: Neck pain  [id number]  Gabriela Shepard is a 57 y.o. female coming in with complaint of neck pain. Pain is worse today. States her pain is around the shoulder blade. Pain results in migraine headaches. Recently quit the exercises and says she's missed about 90% of work. Still has numbness and is weaker in the right arm. Now has nerve pain that runs down arm when she plays with her dog. States the gabapentin is not really helping. Helped about 10-15%.  Patient has had work-up including an MRI and it did show significant foraminal narrowing at multiple levels especially on the right side.  Patient states if anything the pain is worsening.  Patient states that it is severe and sometimes radiating down the arm.  It seems to be worse in the right trapezius area.     Past Medical History:  Diagnosis Date  . Arthritis   . Bowel habit changes    been going on couple years  . Depression   . Flatulence    excessive with strong odor/uncontrollable  . Headache   . 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 History of UTI   . Migraines   . PONV (postoperative nausea and vomiting)   . PTSD (post-traumatic stress disorder)   . Urine incontinence    Past Surgical History:  Procedure Laterality Date  . BREAST BIOPSY    . CESAREAN SECTION  1992  . COLONOSCOPY    . INCONTINENCE SURGERY    . LAPAROSCOPY     under bladder  . 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 Marital status: Divorced    Spouse name: Not on file  . Number of children: Not on file  . Years of education: Not on file  . Highest education level: Not on file  Occupational History  . Not on file  Social Needs    . Financial resource strain: Not on file  . Food insecurity:    Worry: Not on file    Inability: Not on file  . Transportation needs:    Medical: Not on file    Non-medical: Not on file  Tobacco Use  . Smoking status: Former Smoker    Packs/day: 0.50    Years: 10.00    Pack years: 5.00    Types: Cigarettes    Last attempt to quit: 11/13/2010    Years since quitting: 7.1  . Smokeless tobacco: Never Used  Substance and Sexual Activity  . Alcohol use: No    Alcohol/week: 0.0 oz    Comment: pt denies.  . Drug use: No  . Sexual activity: Not on file  Lifestyle  . Physical activity:    Days per week: Not on file    Minutes per session: Not on file  . Stress: Not on file  Relationships  . Social connections:    Talks on phone: Not on file    Gets together: Not on file    Attends religious service: Not on file    Active member of club or organization: Not on file    Attends meetings of clubs or organizations: Not on file    Relationship status: Not on  file  Other Topics Concern  . Not on file  Social History Narrative  . Not on file   Allergies  Allergen Reactions  . Sulfa Antibiotics Hives   Family History  Problem Relation Age of Onset  . Hypertension Mother   . Alcohol abuse Other   . Arthritis Other   . Hypertension Other   . Mental illness Other   . Kidney cancer Father   . Ulcers Father   . Drug abuse Sister   . Colon cancer Neg Hx      Past medical history, social, surgical and family history all reviewed in electronic medical record.  No pertanent information unless stated regarding to the chief complaint.   Review of Systems:Review of systems updated and as accurate as of 12/22/17  No visual changes, nausea, vomiting, diarrhea, constipation, dizziness, abdominal pain, skin rash, fevers, chills, night sweats, weight loss, swollen lymph nodes, body aches, joint swelling,  chest pain, shortness of breath, mood changes.  Positive headaches and muscle  aches  Objective  Blood pressure 130/80, pulse 82, height 5' 4.5" (1.638 m), weight 155 lb (70.3 kg), SpO2 98 %. Systems examined below as of 12/22/17   General: No apparent distress alert and oriented x3 mood and affect normal, dressed appropriately.  HEENT: Pupils equal, extraocular movements intact  Respiratory: Patient's speak in full sentences and does not appear short of breath  Cardiovascular: No lower extremity edema, non tender, no erythema  Skin: Warm dry intact with no signs of infection or rash on extremities or on axial skeleton.  Abdomen: Soft nontender  Neuro: Cranial nerves II through XII are intact, neurovascularly intact in all extremities with 2+ DTRs and 2+ pulses.  Lymph: No lymphadenopathy of posterior or anterior cervical chain or axillae bilaterally.  Gait normal with good balance and coordination.  MSK:  Non tender with full range of motion and good stability and symmetric strength and tone of shoulders, elbows, wrist, hip, knee and ankles bilaterally.  Neck: Inspection loss of lordosis. No palpable stepoffs. Positive Spurling's maneuver on the right side. Limited range of motion lacking sidebending and rotation to the right Grip strength and sensation normal in bilateral hands Strength good C4 to T1 distribution No sensory change to C4 to T1 Negative Hoffman sign bilaterally Reflexes normal Severe tightness with multiple trigger points in the trapezius muscle on the right   After verbal consent patient was prepped with alcohol swabs and with a 25-gauge half inch needle patient was injected into 4 distinct trigger points in the right shoulder and trapezius area.  Total of 3 cc of 0.5% Marcaine and 1 cc of Kenalog 40 mg/mL used.  Minimal blood loss.  Band-Aids placed.  Postinjection instructions given   Impression and Recommendations:     This case required medical decision making of moderate complexity.      Note: This dictation was prepared with  Dragon dictation along with smaller phrase technology. Any transcriptional errors that result from this process are unintentional.

## 2017-12-22 ENCOUNTER — Encounter: Payer: Self-pay | Admitting: Family Medicine

## 2017-12-22 ENCOUNTER — Ambulatory Visit (INDEPENDENT_AMBULATORY_CARE_PROVIDER_SITE_OTHER): Payer: BLUE CROSS/BLUE SHIELD | Admitting: Family Medicine

## 2017-12-22 VITALS — BP 130/80 | HR 82 | Ht 64.5 in | Wt 155.0 lb

## 2017-12-22 DIAGNOSIS — M25511 Pain in right shoulder: Secondary | ICD-10-CM | POA: Diagnosis not present

## 2017-12-22 DIAGNOSIS — M542 Cervicalgia: Secondary | ICD-10-CM

## 2017-12-22 DIAGNOSIS — M5412 Radiculopathy, cervical region: Secondary | ICD-10-CM

## 2017-12-22 MED ORDER — ESOMEPRAZOLE MAGNESIUM 40 MG PO CPDR: 40.0000 mg | DELAYED_RELEASE_CAPSULE | Freq: Every day | ORAL | 3 refills | Status: DC

## 2017-12-22 NOTE — Assessment & Plan Note (Signed)
Given injection today.  Tolerated the procedures well.  See if this will help with some of patient's symptoms will likely not going to help with the underlying problem of the lumbar radiculopathy.  Patient has been set up for an epidural.  Increase gabapentin.  Follow-up again in 4 weeks

## 2017-12-22 NOTE — Patient Instructions (Signed)
Good to see you  Ice is your friend Nexium 2 times a day for 2 weeks  Trigger pint injections given today  Also will get you an epidural  1-3 pills of gabapentin at night See me again 2-3 weeks AFTER the epidural

## 2017-12-22 NOTE — Assessment & Plan Note (Signed)
Worsening symptoms.  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  Independently visualized by me again today.  Discussed with patient that I do feel that possible epidural injection could be beneficial and patient will be set up for this.  Attempted trigger point injections today.  Increase gabapentin.  Follow-up 2 weeks after the epidural

## 2017-12-23 ENCOUNTER — Other Ambulatory Visit: Payer: Self-pay | Admitting: Family Medicine

## 2017-12-23 ENCOUNTER — Encounter: Payer: Self-pay | Admitting: Family Medicine

## 2017-12-23 ENCOUNTER — Ambulatory Visit
Admission: RE | Admit: 2017-12-23 | Discharge: 2017-12-23 | Disposition: A | Discharge status: Home / Self Care | Payer: BLUE CROSS/BLUE SHIELD | Source: Ambulatory Visit | Attending: Family Medicine | Admitting: Family Medicine

## 2017-12-23 DIAGNOSIS — N644 Mastodynia: Secondary | ICD-10-CM

## 2017-12-23 DIAGNOSIS — Z1231 Encounter for screening mammogram for malignant neoplasm of breast: Secondary | ICD-10-CM

## 2017-12-30 ENCOUNTER — Other Ambulatory Visit: Payer: Self-pay | Admitting: Family Medicine

## 2018-01-02 ENCOUNTER — Ambulatory Visit
Admission: RE | Admit: 2018-01-02 | Discharge: 2018-01-02 | Disposition: A | Discharge status: Home / Self Care | Payer: BLUE CROSS/BLUE SHIELD | Source: Ambulatory Visit | Attending: Family Medicine | Admitting: Family Medicine

## 2018-01-02 DIAGNOSIS — N644 Mastodynia: Secondary | ICD-10-CM

## 2018-01-02 DIAGNOSIS — N6489 Other specified disorders of breast: Secondary | ICD-10-CM | POA: Diagnosis not present

## 2018-01-02 DIAGNOSIS — R922 Inconclusive mammogram: Secondary | ICD-10-CM | POA: Diagnosis not present

## 2018-01-03 NOTE — Telephone Encounter (Signed)
Aimovig 70 mg/ml has been denied.  Denial letter placed in Dr Erick Blinks folder.

## 2018-01-16 ENCOUNTER — Other Ambulatory Visit: Payer: Self-pay

## 2018-01-26 ENCOUNTER — Ambulatory Visit
Admission: RE | Admit: 2018-01-26 | Discharge: 2018-01-26 | Disposition: A | Discharge status: Home / Self Care | Payer: BLUE CROSS/BLUE SHIELD | Source: Ambulatory Visit | Attending: Family Medicine | Admitting: Family Medicine

## 2018-01-26 DIAGNOSIS — M50222 Other cervical disc displacement at C5-C6 level: Secondary | ICD-10-CM | POA: Diagnosis not present

## 2018-01-26 DIAGNOSIS — M542 Cervicalgia: Secondary | ICD-10-CM

## 2018-01-26 DIAGNOSIS — M5021 Other cervical disc displacement,  high cervical region: Secondary | ICD-10-CM | POA: Diagnosis not present

## 2018-01-26 DIAGNOSIS — M50223 Other cervical disc displacement at C6-C7 level: Secondary | ICD-10-CM | POA: Diagnosis not present

## 2018-01-26 MED ORDER — TRIAMCINOLONE ACETONIDE 40 MG/ML IJ SUSP (RADIOLOGY)
60.0000 mg | Freq: Once | INTRAMUSCULAR | Status: AC
  Administered 2018-01-26: 60 mg via EPIDURAL

## 2018-01-26 MED ORDER — IOPAMIDOL (ISOVUE-M 300) INJECTION 61%
1.0000 mL | Freq: Once | INTRAMUSCULAR | Status: AC | PRN
  Administered 2018-01-26: 1 mL via EPIDURAL

## 2018-01-26 NOTE — Discharge Instructions (Signed)

## 2018-02-05 NOTE — Progress Notes (Deleted)
[facility] [address], [place] [address] Phone: [phone] Subjective:    I'm seeing this patient by the request  of:    CC:   [id number]  Gabriela Shepard is a 57 y.o. female coming in with complaint of ***  Onset-  Location Duration-  Character- Aggravating factors- Reliving factors-  Therapies tried-  Severity-     Past Medical History:  Diagnosis Date  . Arthritis   . Bowel habit changes    been going on couple years  . Depression   . Flatulence    excessive with strong odor/uncontrollable  . Headache   . 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 History of UTI   . Migraines   . PONV (postoperative nausea and vomiting)   . PTSD (post-traumatic stress disorder)   . Urine incontinence    Past Surgical History:  Procedure Laterality Date  . BREAST BIOPSY Left    2017 benign x 2  . CESAREAN SECTION  1992  . COLONOSCOPY    . INCONTINENCE SURGERY    . LAPAROSCOPY     under bladder  . 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 Marital status: Divorced    Spouse name: Not on file  . Number of children: Not on file  . Years of education: Not on file  . Highest education level: Not on file  Occupational History  . Not on file  Social Needs  . Financial resource strain: Not on file  . Food insecurity:    Worry: Not on file    Inability: Not on file  . Transportation needs:    Medical: Not on file    Non-medical: Not on file  Tobacco Use  . Smoking status: Former Smoker    Packs/day: 0.50    Years: 10.00    Pack years: 5.00    Types: Cigarettes    Last attempt to quit: 11/13/2010    Years since quitting: 7.2  . Smokeless tobacco: Never Used  Substance and Sexual Activity  . Alcohol use: No    Alcohol/week: 0.0  standard drinks    Comment: pt denies.  . Drug use: No  . Sexual activity: Not on file  Lifestyle  . Physical activity:    Days per week: Not on file    Minutes per session: Not on file  . Stress: Not on file  Relationships  . Social connections:    Talks on phone: Not on file    Gets together: Not on file    Attends religious service: Not on file    Active member of club or organization: Not on file    Attends meetings of clubs or organizations: Not on file    Relationship status: Not on file  Other Topics Concern  . Not on file  Social History Narrative  . Not on file   Allergies  Allergen Reactions  . Sulfa Antibiotics Hives   Family History  Problem Relation Age of Onset  . Hypertension Mother   . Alcohol abuse Other   . Arthritis Other   . Hypertension Other   . Mental illness Other   . Kidney cancer Father   . Ulcers Father   . Drug abuse Sister   . Colon cancer  Neg Hx     Current Outpatient Medications (Endocrine & Metabolic):  .  estrogen, conjugated,-medroxyprogesterone (PREMPRO) 0.625-5 MG tablet, Take 1 tablet by mouth daily.     Current Outpatient Medications (Respiratory):  .  fluticasone (FLONASE) 50 MCG/ACT nasal spray, Place 2 sprays daily into both nostrils. Marland Kitchen  levocetirizine (XYZAL) 5 MG tablet, TAKE 1 TABLET (5 MG TOTAL) BY MOUTH EVERY EVENING. .  promethazine (PHENERGAN) 25 MG tablet, TAKE 1 TABLET BY MOUTH EVERY 6 HOURS AS NEEDED FOR NAUSEA AND VOMITING   Current Outpatient Medications (Analgesics):  Marland Kitchen  Erenumab-aooe (AIMOVIG) 70 MG/ML SOAJ, Inject 70 mLs into the skin every 30 (thirty) days. Eduard Roux 70 MG/ML SOAJ, Inject into the skin. .  SUMAtriptan (IMITREX) 100 MG tablet, May repeat in 2 hours if headache persists or recurs.     Current Outpatient Medications (Other):  .  b complex vitamins tablet, Take 1 tablet by mouth daily. .  baclofen (LIORESAL) 10 MG tablet, Take 1 tablet (10 mg total) by mouth 3 (three) times daily  as needed for muscle spasms. .  Calcium Carbonate-Vit D-Min (CALCIUM 1200 PO), Take 2 tablets by mouth daily. .  Cholecalciferol (VITAMIN D3 PO), Take 1 tablet by mouth daily. .  clonazePAM (KLONOPIN) 1 MG tablet, TAKE 1 TABLET BY MOUTH AT BEDTIME .  esomeprazole (NEXIUM) 40 MG capsule, Take 1 capsule (40 mg total) by mouth daily. Marland Kitchen  gabapentin (NEURONTIN) 100 MG capsule, Take 2 capsules (200 mg total) by mouth at bedtime. .  hyoscyamine (LEVBID) 0.375 MG 12 hr tablet, TAKE 1 TABLET BY MOUTH TWICE A DAY .  MAGNESIUM PO, Take 2 tablets by mouth daily. .  pramoxine-hydrocortisone (PROCTOCREAM-HC) 1-1 % rectal cream, Place 1 application rectally 2 (two) times daily. .  valACYclovir (VALTREX) 1000 MG tablet, Take 1,000 mg by mouth as needed (outbreak).  .  venlafaxine XR (EFFEXOR XR) 150 MG 24 hr capsule, Take 1 capsule (150 mg total) by mouth daily with breakfast. .  Vitamin D, Ergocalciferol, (DRISDOL) 50000 units CAPS capsule, Take 1 capsule (50,000 Units total) by mouth every 7 (seven) days.  Current Facility-Administered Medications (Other):  .  0.9 %  sodium chloride infusion    Past medical history, social, surgical and family history all reviewed in electronic medical record.  No pertanent information unless stated regarding to the chief complaint.   Review of Systems:  No headache, visual changes, nausea, vomiting, diarrhea, constipation, dizziness, abdominal pain, skin rash, fevers, chills, night sweats, weight loss, swollen lymph nodes, body aches, joint swelling, muscle aches, chest pain, shortness of breath, mood changes.   Objective  There were no vitals taken for this visit. Systems examined below as of    General: No apparent distress alert and oriented x3 mood and affect normal, dressed appropriately.  HEENT: Pupils equal, extraocular movements intact  Respiratory: Patient's speak in full sentences and does not appear short of breath  Cardiovascular: No lower extremity  edema, non tender, no erythema  Skin: Warm dry intact with no signs of infection or rash on extremities or on axial skeleton.  Abdomen: Soft nontender  Neuro: Cranial nerves II through XII are intact, neurovascularly intact in all extremities with 2+ DTRs and 2+ pulses.  Lymph: No lymphadenopathy of posterior or anterior cervical chain or axillae bilaterally.  Gait normal with good balance and coordination.  MSK:  Non tender with full range of motion and good stability and symmetric strength and tone of shoulders, elbows, wrist, hip, knee and ankles  bilaterally.     Impression and Recommendations:     This case required medical decision making of moderate complexity. The above documentation has been reviewed and is accurate and complete Lyndal Pulley, DO       Note: This dictation was prepared with Dragon dictation along with smaller phrase technology. Any transcriptional errors that result from this process are unintentional.

## 2018-02-07 ENCOUNTER — Ambulatory Visit (INDEPENDENT_AMBULATORY_CARE_PROVIDER_SITE_OTHER): Payer: BLUE CROSS/BLUE SHIELD | Admitting: Family Medicine

## 2018-02-07 ENCOUNTER — Ambulatory Visit: Payer: BLUE CROSS/BLUE SHIELD | Admitting: Family Medicine

## 2018-02-07 ENCOUNTER — Encounter: Payer: Self-pay | Admitting: Family Medicine

## 2018-02-07 ENCOUNTER — Other Ambulatory Visit (INDEPENDENT_AMBULATORY_CARE_PROVIDER_SITE_OTHER): Payer: BLUE CROSS/BLUE SHIELD

## 2018-02-07 VITALS — BP 130/84 | HR 82 | Ht 64.5 in | Wt 154.0 lb

## 2018-02-07 DIAGNOSIS — M2550 Pain in unspecified joint: Secondary | ICD-10-CM

## 2018-02-07 DIAGNOSIS — R635 Abnormal weight gain: Secondary | ICD-10-CM

## 2018-02-07 DIAGNOSIS — M5412 Radiculopathy, cervical region: Secondary | ICD-10-CM | POA: Diagnosis not present

## 2018-02-07 DIAGNOSIS — M25511 Pain in right shoulder: Secondary | ICD-10-CM

## 2018-02-07 LAB — SEDIMENTATION RATE: Sed Rate: 24 mm/hr (ref 0–30)

## 2018-02-07 LAB — URIC ACID: URIC ACID, SERUM: 2.9 mg/dL (ref 2.4–7.0)

## 2018-02-07 LAB — VITAMIN D 25 HYDROXY (VIT D DEFICIENCY, FRACTURES): VITD: 51.96 ng/mL (ref 30.00–100.00)

## 2018-02-07 LAB — T3, FREE: T3, Free: 3.4 pg/mL (ref 2.3–4.2)

## 2018-02-07 LAB — TSH: TSH: 1.69 u[IU]/mL (ref 0.35–4.50)

## 2018-02-07 LAB — IBC PANEL
IRON: 69 ug/dL (ref 42–145)
SATURATION RATIOS: 14.2 % — AB (ref 20.0–50.0)
Transferrin: 347 mg/dL (ref 212.0–360.0)

## 2018-02-07 LAB — T4, FREE: Free T4: 0.73 ng/dL (ref 0.60–1.60)

## 2018-02-07 MED ORDER — LEVOTHYROXINE SODIUM 25 MCG PO TABS: 25.0000 ug | ORAL_TABLET | Freq: Every day | ORAL | 1 refills | Status: DC

## 2018-02-07 NOTE — Progress Notes (Signed)
[facility] [address], [place] [address] Phone: [phone] Subjective:     I Gabriela Shepard am serving as a Education administrator for Dr. Hulan Saas.  CC: Neck pain follow-up  [id number]  Gabriela Shepard is a 57 y.o. female coming in with complaint of neck pain. Right hip pain as well. States that she is light headed and nauseous. States that she just feels "weird". Trigger point injections did well. Epidural was painful. Caused the trigger point to come back. Hasn't been able to be active. Pain has made her very depressed. Not sleeping. Does not feel like herself.  Has gained about 25 lbs in the past 3 years. Is very emotional today. Has been taking meds. Wants to know if she can see a nutritionist for her decreased eating habits. Biggest concern is tedhe nausea and light headedness. Feels like car sickness. Hasn't been working. No new medications no recent illness.      Past Medical History:  Diagnosis Date  . Arthritis   . Bowel habit changes    been going on couple years  . Depression   . Flatulence    excessive with strong odor/uncontrollable  . Headache   . 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 History of UTI   . Migraines   . PONV (postoperative nausea and vomiting)   . PTSD (post-traumatic stress disorder)   . Urine incontinence    Past Surgical History:  Procedure Laterality Date  . BREAST BIOPSY Left    2017 benign x 2  . CESAREAN SECTION  1992  . COLONOSCOPY    . INCONTINENCE SURGERY    . LAPAROSCOPY     under bladder  . 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 Marital status: Divorced    Spouse name: Not on file  . Number of children: Not on file  . Years of education: Not on file  . Highest education level: Not on file    Occupational History  . Not on file  Social Needs  . Financial resource strain: Not on file  . Food insecurity:    Worry: Not on file    Inability: Not on file  . Transportation needs:    Medical: Not on file    Non-medical: Not on file  Tobacco Use  . Smoking status: Former Smoker    Packs/day: 0.50    Years: 10.00    Pack years: 5.00    Types: Cigarettes    Last attempt to quit: 11/13/2010    Years since quitting: 7.2  . Smokeless tobacco: Never Used  Substance and Sexual Activity  . Alcohol use: No    Alcohol/week: 0.0 standard drinks    Comment: pt denies.  . Drug use: No  . Sexual activity: Not on file  Lifestyle  . Physical activity:    Days per week: Not on file    Minutes per session: Not on file  . Stress: Not on file  Relationships  . Social connections:    Talks on phone: Not on file    Gets together: Not on file    Attends religious service: Not on file    Active member of club or organization: Not on file    Attends meetings of clubs or  organizations: Not on file    Relationship status: Not on file  Other Topics Concern  . Not on file  Social History Narrative  . Not on file   Allergies  Allergen Reactions  . Sulfa Antibiotics Hives   Family History  Problem Relation Age of Onset  . Hypertension Mother   . Alcohol abuse Other   . Arthritis Other   . Hypertension Other   . Mental illness Other   . Kidney cancer Father   . Ulcers Father   . Drug abuse Sister   . Colon cancer Neg Hx     Current Outpatient Medications (Endocrine & Metabolic):  .  estrogen, conjugated,-medroxyprogesterone (PREMPRO) 0.625-5 MG tablet, Take 1 tablet by mouth daily. Marland Kitchen  levothyroxine (SYNTHROID) 25 MCG tablet, Take 1 tablet (25 mcg total) by mouth daily before breakfast.     Current Outpatient Medications (Respiratory):  .  fluticasone (FLONASE) 50 MCG/ACT nasal spray, Place 2 sprays daily into both nostrils. Marland Kitchen  levocetirizine (XYZAL) 5 MG tablet, TAKE 1 TABLET  (5 MG TOTAL) BY MOUTH EVERY EVENING. .  promethazine (PHENERGAN) 25 MG tablet, TAKE 1 TABLET BY MOUTH EVERY 6 HOURS AS NEEDED FOR NAUSEA AND VOMITING   Current Outpatient Medications (Analgesics):  Marland Kitchen  Erenumab-aooe (AIMOVIG) 70 MG/ML SOAJ, Inject 70 mLs into the skin every 30 (thirty) days. Eduard Roux 70 MG/ML SOAJ, Inject into the skin. .  SUMAtriptan (IMITREX) 100 MG tablet, May repeat in 2 hours if headache persists or recurs.     Current Outpatient Medications (Other):  .  b complex vitamins tablet, Take 1 tablet by mouth daily. .  baclofen (LIORESAL) 10 MG tablet, Take 1 tablet (10 mg total) by mouth 3 (three) times daily as needed for muscle spasms. .  Calcium Carbonate-Vit D-Min (CALCIUM 1200 PO), Take 2 tablets by mouth daily. .  Cholecalciferol (VITAMIN D3 PO), Take 1 tablet by mouth daily. .  clonazePAM (KLONOPIN) 1 MG tablet, TAKE 1 TABLET BY MOUTH AT BEDTIME .  esomeprazole (NEXIUM) 40 MG capsule, Take 1 capsule (40 mg total) by mouth daily. Marland Kitchen  gabapentin (NEURONTIN) 100 MG capsule, Take 2 capsules (200 mg total) by mouth at bedtime. .  hyoscyamine (LEVBID) 0.375 MG 12 hr tablet, TAKE 1 TABLET BY MOUTH TWICE A DAY .  MAGNESIUM PO, Take 2 tablets by mouth daily. .  pramoxine-hydrocortisone (PROCTOCREAM-HC) 1-1 % rectal cream, Place 1 application rectally 2 (two) times daily. .  valACYclovir (VALTREX) 1000 MG tablet, Take 1,000 mg by mouth as needed (outbreak).  .  venlafaxine XR (EFFEXOR XR) 150 MG 24 hr capsule, Take 1 capsule (150 mg total) by mouth daily with breakfast. .  Vitamin D, Ergocalciferol, (DRISDOL) 50000 units CAPS capsule, Take 1 capsule (50,000 Units total) by mouth every 7 (seven) days.  Current Facility-Administered Medications (Other):  .  0.9 %  sodium chloride infusion    Past medical history, social, surgical and family history all reviewed in electronic medical record.  No pertanent information unless stated regarding to the chief complaint.    Review of Systems:  No  visual changes,  vomiting, diarrhea, constipation, dizziness, abdominal pain, skin rash, fevers, chills, night sweats, weight loss, swollen lymph nodes, hest pain, shortness of breath, mood changes.  Positive muscle aches, body aches, headaches, nausea  Objective  Blood pressure 130/84, pulse 82, height 5' 4.5" (1.638 m), weight 154 lb (69.9 kg), SpO2 98 %.   General: No apparent distress alert and oriented x3 mood and affect normal,  dressed appropriately.  HEENT: Pupils equal, extraocular movements intact  Respiratory: Patient's speak in full sentences and does not appear short of breath  Cardiovascular: Trace lower extremity edema, non tender, no erythema  Skin: Warm dry intact with no signs of infection or rash on extremities or on axial skeleton.  Abdomen: Soft nontender  Neuro: Cranial nerves II through XII are intact, neurovascularly intact in all extremities with 2+ DTRs and 2+ pulses.  Lymph: No lymphadenopathy of posterior or anterior cervical chain or axillae bilaterally.  Gait normal with good balance and coordination.  MSK:  tender with full range of motion and good stability and symmetric strength and tone of shoulders, elbows, wrist, hip, knee and ankles bilaterally.  Neck: Inspection loss of lordosis. No palpable stepoffs. Positive Spurling's maneuver. Mild limitation in all planes but especially with right-sided sidebending and rotation Grip strength and sensation normal in bilateral hands Strength good C4 to T1 distribution No sensory change to C4 to T1 Negative Hoffman sign bilaterally Reflexes normal Significant tightness of the right trapezius  After verbal consent patient was prepped with alcohol swabs and with a 25-gauge needle injected and 4 distinct trigger points in the right trapezius area.  No blood loss.  Postinjection instructions given     Impression and Recommendations:     This case required medical decision making of  moderate complexity. The above documentation has been reviewed and is accurate and complete Lyndal Pulley, DO       Note: This dictation was prepared with Dragon dictation along with smaller phrase technology. Any transcriptional errors that result from this process are unintentional.

## 2018-02-07 NOTE — Patient Instructions (Signed)
Good to see you  Injected the trigger points again  Ice is your friend COntinue the vitamins Start synthroid 54mcg daily  Continue all other meds [facility] They will call you about the weight and wellness.  See me again in 4 weeks and lets see how you are doing

## 2018-02-08 ENCOUNTER — Encounter: Payer: Self-pay | Admitting: Family Medicine

## 2018-02-08 DIAGNOSIS — R635 Abnormal weight gain: Secondary | ICD-10-CM | POA: Insufficient documentation

## 2018-02-08 NOTE — Assessment & Plan Note (Signed)
Patient does have narrowing noted with arthritic changes.  Does not want surgical intervention.  Did respond to the epidural somewhat but continues to have pain on a daily basis.  Will get laboratory work-up for polyarthralgia.  May need medication changes.  Started on low-dose of Synthroid to see if that could augment patient's Effexor.  May need to change Effexor in the long run.  Follow-up again in 3 weeks

## 2018-02-08 NOTE — Assessment & Plan Note (Signed)
Weight gain is likely multifactorial.  Likely secondary to caloric intake but does have some depression and anxiety that I think is playing a role.  Patient is looking for guidance.  [facility].

## 2018-02-08 NOTE — Assessment & Plan Note (Signed)
Trigger point injection given again today.  We discussed we do not want to do this on a regular basis.  Has been dry needling and physical therapy with minimal improvement.  Patient's did respond somewhat to the epidural.  Patient is on multiple different medications but encourage patient to take gabapentin on a more regular basis.  Follow-up with me again in 3 weeks

## 2018-02-09 LAB — CALCIUM, IONIZED: Calcium, Ion: 4.97 mg/dL (ref 4.8–5.6)

## 2018-02-09 LAB — PTH, INTACT AND CALCIUM
CALCIUM: 9.6 mg/dL (ref 8.6–10.4)
PTH: 30 pg/mL (ref 14–64)

## 2018-02-13 ENCOUNTER — Other Ambulatory Visit: Payer: Self-pay | Admitting: Family Medicine

## 2018-02-15 ENCOUNTER — Other Ambulatory Visit: Payer: Self-pay | Admitting: Family Medicine

## 2018-02-15 NOTE — Telephone Encounter (Signed)
Refill done.  

## 2018-02-20 DIAGNOSIS — M4802 Spinal stenosis, cervical region: Secondary | ICD-10-CM | POA: Diagnosis not present

## 2018-02-20 DIAGNOSIS — M5412 Radiculopathy, cervical region: Secondary | ICD-10-CM | POA: Diagnosis not present

## 2018-02-20 DIAGNOSIS — M542 Cervicalgia: Secondary | ICD-10-CM | POA: Diagnosis not present

## 2018-02-20 DIAGNOSIS — M5020 Other cervical disc displacement, unspecified cervical region: Secondary | ICD-10-CM | POA: Diagnosis not present

## 2018-02-24 DIAGNOSIS — M4722 Other spondylosis with radiculopathy, cervical region: Secondary | ICD-10-CM | POA: Diagnosis not present

## 2018-02-24 DIAGNOSIS — M50322 Other cervical disc degeneration at C5-C6 level: Secondary | ICD-10-CM | POA: Diagnosis not present

## 2018-02-24 DIAGNOSIS — M50122 Cervical disc disorder at C5-C6 level with radiculopathy: Secondary | ICD-10-CM | POA: Diagnosis not present

## 2018-02-24 DIAGNOSIS — M5020 Other cervical disc displacement, unspecified cervical region: Secondary | ICD-10-CM | POA: Diagnosis not present

## 2018-02-24 HISTORY — PX: NECK SURGERY: SHX720

## 2018-02-27 ENCOUNTER — Encounter (HOSPITAL_COMMUNITY): Payer: Self-pay | Admitting: Emergency Medicine

## 2018-02-27 ENCOUNTER — Emergency Department (HOSPITAL_COMMUNITY): Payer: BLUE CROSS/BLUE SHIELD

## 2018-02-27 ENCOUNTER — Ambulatory Visit (HOSPITAL_BASED_OUTPATIENT_CLINIC_OR_DEPARTMENT_OTHER): Payer: BLUE CROSS/BLUE SHIELD

## 2018-02-27 ENCOUNTER — Emergency Department (HOSPITAL_COMMUNITY)
Admission: EM | Admit: 2018-02-27 | Discharge: 2018-02-27 | Disposition: A | Discharge status: Home / Self Care | Payer: BLUE CROSS/BLUE SHIELD | Attending: Emergency Medicine | Admitting: Emergency Medicine

## 2018-02-27 ENCOUNTER — Encounter (HOSPITAL_COMMUNITY): Payer: BLUE CROSS/BLUE SHIELD

## 2018-02-27 DIAGNOSIS — M7989 Other specified soft tissue disorders: Secondary | ICD-10-CM

## 2018-02-27 DIAGNOSIS — J392 Other diseases of pharynx: Secondary | ICD-10-CM | POA: Diagnosis not present

## 2018-02-27 DIAGNOSIS — M79609 Pain in unspecified limb: Secondary | ICD-10-CM

## 2018-02-27 DIAGNOSIS — Z87891 Personal history of nicotine dependence: Secondary | ICD-10-CM | POA: Diagnosis not present

## 2018-02-27 DIAGNOSIS — R0602 Shortness of breath: Secondary | ICD-10-CM | POA: Diagnosis not present

## 2018-02-27 DIAGNOSIS — R070 Pain in throat: Secondary | ICD-10-CM | POA: Insufficient documentation

## 2018-02-27 DIAGNOSIS — R05 Cough: Secondary | ICD-10-CM | POA: Diagnosis not present

## 2018-02-27 DIAGNOSIS — Z79899 Other long term (current) drug therapy: Secondary | ICD-10-CM | POA: Diagnosis not present

## 2018-02-27 DIAGNOSIS — Z96652 Presence of left artificial knee joint: Secondary | ICD-10-CM | POA: Diagnosis not present

## 2018-02-27 DIAGNOSIS — R531 Weakness: Secondary | ICD-10-CM | POA: Diagnosis not present

## 2018-02-27 DIAGNOSIS — R7989 Other specified abnormal findings of blood chemistry: Secondary | ICD-10-CM | POA: Diagnosis not present

## 2018-02-27 DIAGNOSIS — R0781 Pleurodynia: Secondary | ICD-10-CM | POA: Diagnosis not present

## 2018-02-27 DIAGNOSIS — E041 Nontoxic single thyroid nodule: Secondary | ICD-10-CM | POA: Diagnosis not present

## 2018-02-27 LAB — I-STAT TROPONIN, ED: TROPONIN I, POC: 0 ng/mL (ref 0.00–0.08)

## 2018-02-27 LAB — BRAIN NATRIURETIC PEPTIDE: B Natriuretic Peptide: 47 pg/mL (ref 0.0–100.0)

## 2018-02-27 LAB — BASIC METABOLIC PANEL
Anion gap: 9 (ref 5–15)
BUN: 8 mg/dL (ref 6–20)
CALCIUM: 8.5 mg/dL — AB (ref 8.9–10.3)
CO2: 26 mmol/L (ref 22–32)
Chloride: 91 mmol/L — ABNORMAL LOW (ref 98–111)
Creatinine, Ser: 0.63 mg/dL (ref 0.44–1.00)
Glucose, Bld: 97 mg/dL (ref 70–99)
Potassium: 3.8 mmol/L (ref 3.5–5.1)
Sodium: 126 mmol/L — ABNORMAL LOW (ref 135–145)

## 2018-02-27 LAB — D-DIMER, QUANTITATIVE (NOT AT ARMC): D DIMER QUANT: 1.61 ug{FEU}/mL — AB (ref 0.00–0.50)

## 2018-02-27 MED ORDER — IOPAMIDOL (ISOVUE-370) INJECTION 76%
INTRAVENOUS | Status: AC
  Filled 2018-02-27: qty 100

## 2018-02-27 MED ORDER — METHYLPREDNISOLONE 4 MG PO TBPK: ORAL_TABLET | ORAL | 0 refills | Status: DC

## 2018-02-27 MED ORDER — DEXAMETHASONE SODIUM PHOSPHATE 10 MG/ML IJ SOLN
10.0000 mg | Freq: Once | INTRAMUSCULAR | Status: AC
  Administered 2018-02-27: 10 mg via INTRAVENOUS
  Filled 2018-02-27: qty 1

## 2018-02-27 MED ORDER — IOPAMIDOL (ISOVUE-370) INJECTION 76%
100.0000 mL | Freq: Once | INTRAVENOUS | Status: AC | PRN
  Administered 2018-02-27: 100 mL via INTRAVENOUS

## 2018-02-27 NOTE — Progress Notes (Signed)
Preliminary notes--Right lower extremity venous duplex exam completed. Negative for DVT.  Incidental finding :5.33x1.30x3.44cm anechoic cystic structure seen at proximal popliteal fossa and another 3.72x1.27x2.00cm cystic structure with internal echoes seen at distal-medial portion of popliteal fossa.  Hongying Landry Mellow (RDMS RVT) 02/27/18 5:49 PM

## 2018-02-27 NOTE — ED Provider Notes (Signed)
[facility] DEPT Provider Note   CSN: [id number] Arrival date & time: 02/27/18  1215     History   Chief Complaint Chief Complaint  Patient presents with  . Post-op Problem    HPI Gabriela Shepard is a 57 y.o. female with history of chronic neck pain status post recent ACDF of C-spine 5/6/7 by Dr. Vertell Limber on Friday is here for evaluation of shortness of breath.  Described as sensation of fluid in her chest that feels like she can't cough up.  Sensation travels into her throat.  Onset 2 days ago.  Patient describes feeling like her upper chest feels full of fluid that is trying to come out, this worsens when laying flat.  Associated with mild cough dry, shortness of breath, pleuritic substernal chest pain, nausea and weakness.  She tries to cough it up but doesn't have the strength to cough forcefully.  Patient called Dr. Melven Sartorius office and she was instructed to come to the ER.  She denies associated fevers, sweats, lightheadedness, vomiting, abdominal pain, hemoptysis, changes in stools, leg or calf swelling or pain.  Her surgery was 2 hours long.  No previous history of DVT/PE.  No estrogen use.  No recent prolonged travel.  Denies cardiac or pulmonary history.  No tobacco use.  No interventions.  No alleviating factors.  HPI  Past Medical History:  Diagnosis Date  . Arthritis   . Bowel habit changes    been going on couple years  . Depression   . Flatulence    excessive with strong odor/uncontrollable  . Headache   . 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 History of UTI   . Migraines   . PONV (postoperative nausea and vomiting)   . PTSD (post-traumatic stress disorder)   . Urine incontinence     Patient Active Problem List   Diagnosis Date Noted  . Weight gain 02/08/2018  . Trigger point of right shoulder region 12/22/2017  . Cervical radiculopathy at C6 11/29/2017  . Osteopenia 03/09/2016  . Primary osteoarthritis of  left knee 12/23/2015  . IBS (irritable bowel syndrome) 02/19/2015  . Postmenopausal 12/19/2013  . Migraine headache 12/05/2012  . Pain, upper back 09/24/2010  . Depression 09/04/2010    Past Surgical History:  Procedure Laterality Date  . BREAST BIOPSY Left    2017 benign x 2  . CESAREAN SECTION  1992  . COLONOSCOPY    . INCONTINENCE SURGERY    . LAPAROSCOPY     under bladder  . TOTAL KNEE ARTHROPLASTY Left 12/23/2015   Procedure: TOTAL KNEE ARTHROPLASTY;  Surgeon: Melrose Nakayama, MD;  Location: [facility];  Service: Orthopedics;  Laterality: Left;     OB History   None      Home Medications    Prior to Admission medications   Medication Sig Start Date End Date Taking? Authorizing Provider  b complex vitamins tablet Take 1 tablet by mouth daily.   Yes [provider]  Calcium Carbonate-Vit D-Min (CALCIUM 1200 PO) Take 2 tablets by mouth daily.   Yes [provider]  Cholecalciferol (VITAMIN D3 PO) Take 1 tablet by mouth daily.   Yes [provider]  clonazePAM (KLONOPIN) 1 MG tablet TAKE 1 TABLET BY MOUTH AT BEDTIME 12/05/17  Yes Burchette, Alinda Sierras, MD  Erenumab-aooe (AIMOVIG) 70 MG/ML SOAJ Inject 70 mLs into the skin every 30 (thirty) days. 12/13/17  Yes Burchette, Alinda Sierras, MD  esomeprazole (Nye)  40 MG capsule TAKE 1 CAPSULE BY MOUTH EVERY DAY 02/15/18  Yes Hulan Saas M, DO  gabapentin (NEURONTIN) 100 MG capsule Take 2 capsules (200 mg total) by mouth at bedtime. 11/29/17  Yes Lyndal Pulley, DO  hyoscyamine (LEVBID) 0.375 MG 12 hr tablet TAKE 1 TABLET BY MOUTH TWICE A DAY Patient taking differently: Take 0.375 mg by mouth 2 (two) times daily.  04/08/16  Yes Burchette, Alinda Sierras, MD  levothyroxine (SYNTHROID) 25 MCG tablet Take 1 tablet (25 mcg total) by mouth daily before breakfast. 02/07/18  Yes Lyndal Pulley, DO  MAGNESIUM PO Take 2 tablets by mouth daily.   Yes [provider]  methocarbamol (ROBAXIN) 500 MG tablet Take 500 mg by mouth 4  (four) times daily as needed for pain. 02/24/18  Yes [provider]  oxyCODONE-acetaminophen (PERCOCET/ROXICET) 5-325 MG tablet Take by mouth every 4 (four) hours as needed for moderate pain or severe pain.   Yes [provider]  promethazine (PHENERGAN) 25 MG tablet TAKE 1 TABLET BY MOUTH EVERY 6 HOURS AS NEEDED FOR NAUSEA AND VOMITING Patient taking differently: Take 25 mg by mouth every 6 (six) hours as needed for nausea or vomiting.  05/24/17  Yes Burchette, Alinda Sierras, MD  SUMAtriptan (IMITREX) 100 MG tablet May repeat in 2 hours if headache persists or recurs. 12/13/17  Yes Burchette, Alinda Sierras, MD  venlafaxine XR (EFFEXOR XR) 150 MG 24 hr capsule Take 1 capsule (150 mg total) by mouth daily with breakfast. 05/02/17  Yes Burchette, Alinda Sierras, MD  Vitamin D, Ergocalciferol, (DRISDOL) 50000 units CAPS capsule TAKE 1 CAPSULE (50,000 UNITS TOTAL) BY MOUTH EVERY 7 (SEVEN) DAYS. 02/13/18  Yes Lyndal Pulley, DO  baclofen (LIORESAL) 10 MG tablet Take 1 tablet (10 mg total) by mouth 3 (three) times daily as needed for muscle spasms. Patient not taking: Reported on 02/27/2018 04/01/17   Eulas Post, MD  fluticasone (FLONASE) 50 MCG/ACT nasal spray Place 2 sprays daily into both nostrils. 04/14/17   Burchette, Alinda Sierras, MD  levocetirizine (XYZAL) 5 MG tablet TAKE 1 TABLET (5 MG TOTAL) BY MOUTH EVERY EVENING. Patient not taking: Reported on 02/27/2018 10/01/16   Eulas Post, MD  methylPREDNISolone (MEDROL DOSEPAK) 4 MG TBPK tablet Take as directed and until completed 02/27/18   Kinnie Feil, PA-C  pramoxine-hydrocortisone (PROCTOCREAM-HC) 1-1 % rectal cream Place 1 application rectally 2 (two) times daily. 05/03/17   Pyrtle, Lajuan Lines, MD  valACYclovir (VALTREX) 1000 MG tablet Take 1,000 mg by mouth as needed (outbreak).  07/08/15   [provider]    Family History Family History  Problem Relation Age of Onset  . Hypertension Mother   . Alcohol abuse Other   . Arthritis  Other   . Hypertension Other   . Mental illness Other   . Kidney cancer Father   . Ulcers Father   . Drug abuse Sister   . Colon cancer Neg Hx     Social History Social History   Tobacco Use  . Smoking status: Former Smoker    Packs/day: 0.50    Years: 10.00    Pack years: 5.00    Types: Cigarettes    Last attempt to quit: 11/13/2010    Years since quitting: 7.2  . Smokeless tobacco: Never Used  Substance Use Topics  . Alcohol use: No    Alcohol/week: 0.0 standard drinks    Comment: pt denies.  . Drug use: No     Allergies  Sulfa antibiotics   Review of Systems Review of Systems  Respiratory: Positive for cough and shortness of breath.   Cardiovascular: Positive for chest pain.  Gastrointestinal: Positive for nausea.  Neurological: Positive for weakness.  All other systems reviewed and are negative.    Physical Exam Updated Vital Signs BP (!) 129/94   Pulse 71   Temp 97.9 F (36.6 C) (Oral)   Resp 16   Ht 5\' 4"  (1.626 m)   Wt 71.7 kg   SpO2 100%   BMI 27.12 kg/m   Physical Exam  Constitutional: She appears well-developed and well-nourished.  Non toxic. In neck collar.   HENT:  Head: Normocephalic and atraumatic.  Nose: Nose normal.  Dry MM. Oropharynx easily visualized. Uvula midline. No stridor, drooling, SL edema or tenderness. Normal protrusion of tongue. No muffled voice.   Eyes: Pupils are equal, round, and reactive to light. Conjunctivae and EOM are normal.  Neck: Normal range of motion.  Moderate tenderness locally around surgical incision. Surgical incision is intact with skin glue noted.  Mild local edema surrounding incision.    Cardiovascular: Normal rate and regular rhythm.  2+ radial and DP pulses bilaterally. No calf edema.  Mild right calf tenderness without edema, erythema, warmth. Mild sternal tenderness.   Pulmonary/Chest: Effort normal and breath sounds normal.  CTAB, pain with deep inspiration.   Abdominal: Soft. Bowel sounds  are normal. There is no tenderness.  No G/R/R. No suprapubic or CVA tenderness. Negative Murphy's and McBurney's. Active BS to lower quadrants.   Musculoskeletal: Normal range of motion.  Neurological: She is alert.  Skin: Skin is warm and dry. Capillary refill takes less than 2 seconds.  Psychiatric: She has a normal mood and affect. Her behavior is normal.  Nursing note and vitals reviewed.    ED Treatments / Results  Labs (all labs ordered are listed, but only abnormal results are displayed) Labs Reviewed  D-DIMER, QUANTITATIVE (NOT AT [facility]) - Abnormal; Notable for the following components:      Result Value   D-Dimer, Quant 1.61 (*)    All other components within normal limits  BASIC METABOLIC PANEL - Abnormal; Notable for the following components:   Sodium 126 (*)    Chloride 91 (*)    Calcium 8.5 (*)    All other components within normal limits  BRAIN NATRIURETIC PEPTIDE  PREGNANCY, URINE  I-STAT TROPONIN, ED    EKG None  Radiology Dg Chest 2 View  Result Date: 02/27/2018 CLINICAL DATA:  Pt states last Friday she had a ACDF. Pt states after surgery she has been coughing up fluid. Pt HX: ex smoker EXAM: CHEST - 2 VIEW COMPARISON:  04/08/2017 FINDINGS: Heart size is normal. There are no focal consolidations or pleural effusions. No pulmonary edema. Visualized osseous structures have a normal appearance. LOWER cervical fusion. IMPRESSION: No evidence for acute cardiopulmonary abnormality. Electronically Signed   By: Nolon Nations M.D.   On: 02/27/2018 16:22   Ct Soft Tissue Neck W Contrast  Result Date: 02/27/2018 CLINICAL DATA:  Recent ACDF.  Cough. EXAM: CT NECK WITH CONTRAST TECHNIQUE: Multidetector CT imaging of the neck was performed using the standard protocol following the bolus administration of intravenous contrast. CONTRAST:  112mL ISOVUE-370 IOPAMIDOL (ISOVUE-370) INJECTION 76% COMPARISON:  None. FINDINGS: PHARYNX AND LARYNX: --Nasopharynx: Fossae of Rosenmuller  are clear. Normal adenoid tonsils for age. --Oral cavity and oropharynx: The palatine and lingual tonsils are normal. The visible oral cavity and floor of mouth are normal. --Hypopharynx:  Normal vallecula and pyriform sinuses. --Larynx: Normal epiglottis and pre-epiglottic space. Normal aryepiglottic and vocal folds. --Retropharyngeal space: Small retropharyngeal effusion. SALIVARY GLANDS: --Parotid: No mass lesion or inflammation. No sialolithiasis or ductal dilatation. --Submandibular: Symmetric without inflammation. No sialolithiasis or ductal dilatation. --Sublingual: Normal. No ranula or other visible lesion of the base of tongue and floor of mouth. THYROID: 1.7 cm left thyroid nodule. LYMPH NODES: No enlarged or abnormal density lymph nodes. VASCULAR: Major cervical vessels are patent. LIMITED INTRACRANIAL: Normal. VISUALIZED ORBITS: Normal. MASTOIDS AND VISUALIZED PARANASAL SINUSES: No fluid levels or advanced mucosal thickening. No mastoid effusion. SKELETON: C5-C7 ACDF.  Grade 1 anterolisthesis at C3-4. UPPER CHEST: Clear. OTHER: There is postsurgical change within the left cervical soft tissues anterior to the C5-7 ACDF hardware. This includes a small amount of loculated gas. There is no abscess or defined fluid collection. IMPRESSION: 1. Postsurgical changes of the left neck at the level of C5-7 ACDF with moderate edema and multiple locules of gas. Small retropharyngeal effusion, but no abscess or drainable fluid collection. 2. 1.7 cm left thyroid lobe nodule. Further evaluation with dedicated thyroid ultrasound is recommended on a nonemergent basis, if not previously performed. Electronically Signed   By: Ulyses Jarred M.D.   On: 02/27/2018 19:13   Ct Angio Chest Pe W And/or Wo Contrast  Result Date: 02/27/2018 CLINICAL DATA:  PE suspected, positive D-dimer. EXAM: CT ANGIOGRAPHY CHEST WITH CONTRAST TECHNIQUE: Multidetector CT imaging of the chest was performed using the standard protocol during bolus  administration of intravenous contrast. Multiplanar CT image reconstructions and MIPs were obtained to evaluate the vascular anatomy. CONTRAST:  118mL ISOVUE-370 IOPAMIDOL (ISOVUE-370) INJECTION 76% COMPARISON:  None. FINDINGS: Cardiovascular: Some of the most peripheral segmental and subsegmental pulmonary artery branches cannot be definitively characterized due to patient breathing motion artifact, however, there is no convincing pulmonary embolism identified within the main, lobar or central segmental pulmonary arteries bilaterally. Heart size is within normal limits. No pericardial effusion. No thoracic aortic aneurysm or evidence of aortic dissection. Mediastinum/Nodes: No mass or enlarged lymph nodes seen within the mediastinum or perihilar regions. Esophagus appears normal. There is soft tissue thickening/inflammation within the lower neck, extending to the uppermost portion of the mediastinum, corresponding to the area of a recent anterior cervical fusion procedure (to be further characterized on a dedicated neck CT report). Lungs/Pleura: Mild dependent atelectasis. Lungs are otherwise clear. No pleural effusion or pneumothorax. Upper Abdomen: No acute findings. Musculoskeletal: No osseous abnormality within the chest. Review of the MIP images confirms the above findings. IMPRESSION: 1. No pulmonary embolism seen, with mild study limitations detailed above. 2. Mild dependent atelectasis. Lungs otherwise clear. No evidence of pneumonia or pulmonary edema. 3. Soft tissue thickening/inflammation within the lower neck, incompletely imaged on this chest CT angiogram, corresponding to the area of a recent anterior cervical fusion procedure (to be further characterized on a dedicated neck CT report to follow). Electronically Signed   By: Franki Cabot M.D.   On: 02/27/2018 18:59    Procedures Procedures (including critical care time)  Medications Ordered in ED Medications  iopamidol (ISOVUE-370) 76 %  injection (has no administration in time range)  iopamidol (ISOVUE-370) 76 % injection 100 mL (100 mLs Intravenous Contrast Given 02/27/18 1813)  dexamethasone (DECADRON) injection 10 mg (10 mg Intravenous Given 02/27/18 2024)     Initial Impression / Assessment and Plan / ED Course  I have reviewed the triage vital signs and the nursing notes.  Pertinent labs & imaging results that  were available during my care of the patient were reviewed by me and considered in my medical decision making (see chart for details).  Clinical Course as of Feb 27 2102  Mon Feb 27, 2018  1631 Sodium(!): 126 [CG]  1631 D-Dimer, Quant(!): 1.61 [CG]  2100 IMPRESSION: 1. Postsurgical changes of the left neck at the level of C5-7 ACDF with moderate edema and multiple locules of gas. Small retropharyngeal effusion, but no abscess or drainable fluid collection. 2. 1.7 cm left thyroid lobe nodule. Further evaluation with dedicated thyroid ultrasound is recommended on a nonemergent basis, if not previously performed.    CT Soft Tissue Neck W Contrast [CG]    Clinical Course User Index [CG] Kinnie Feil, PA-C    57 year old here with pleuritic central chest pain, shortness of breath, feeling like there is fluid around her lungs that worsens with laying flat.  CP non exertional.  In setting of recent short surgery.  On exam she was noted to have right calf tenderness, very mild without obvious asymmetric edema. Ddx includes PE/DVT.  Will evaluate for cardiac ischemia as well. She has no infectious symptoms to suggest PNA.    2100: d-dimer elevated.  CTA negative for PE. RLE [place] negative for DVT.  CXR w/o PNA.  CT soft tissue neck shows moderate edema and locules of gas likely post surgical.  No fluid or abscess.  Incidental thyroid nodule noted.  Discussed these findings with patient.  Consulted NSGY and spoke to Dr Saintclair Halsted who feels pt is appropriate for discharge. Decadron given prior to DC. DC with medrol pack,  soft diet, f/u with NSGY this week. Strict return precautions discussed. Pt in agreement and comfortable with plan. Pt left without AVS.  Will send PCP message regarding incidental finding of thyroid nodule that needs f/u US.   Final Clinical Impressions(s) / ED Diagnoses   Final diagnoses:  Throat discomfort  Shortness of breath    ED Discharge Orders         Ordered    methylPREDNISolone (MEDROL DOSEPAK) 4 MG TBPK tablet     02/27/18 2028           Kinnie Feil, PA-C 02/27/18 2104    Mesner, [name], MD 02/28/18 [phone]

## 2018-02-27 NOTE — Discharge Instructions (Signed)
You were seen in the ER for discomfort in your chest and throat.  There is no signs of pulmonary embolism in your chest.  CT of your neck showed moderate swelling and inflammatory changes from the recent surgery.  I discussed findings with Dr. Saintclair Halsted who has reviewed the scans and recommends discharge with steroid taper.  Call Dr. Melven Sartorius office tomorrow and schedule appointment for follow-up this week.  Return to the ER for fevers greater than 100.4, worsening swelling or pain in the throat or neck, changes to your voice, difficulty breathing, choking.

## 2018-02-27 NOTE — ED Triage Notes (Signed)
Pt reports that she had neck surgery on Friday and when she lays down she fluid that will choke her and has trouble getting out. Called office today and was told to go to ED.

## 2018-03-01 ENCOUNTER — Other Ambulatory Visit: Payer: Self-pay | Admitting: Family Medicine

## 2018-03-01 NOTE — Telephone Encounter (Signed)
Refill done.  

## 2018-03-02 ENCOUNTER — Other Ambulatory Visit: Payer: Self-pay | Admitting: Family Medicine

## 2018-03-03 ENCOUNTER — Other Ambulatory Visit: Payer: Self-pay

## 2018-03-03 ENCOUNTER — Encounter: Payer: Self-pay | Admitting: Family Medicine

## 2018-03-03 ENCOUNTER — Ambulatory Visit (INDEPENDENT_AMBULATORY_CARE_PROVIDER_SITE_OTHER): Payer: BLUE CROSS/BLUE SHIELD | Admitting: Family Medicine

## 2018-03-03 VITALS — BP 126/82 | HR 96 | Temp 97.6°F | Ht 64.5 in | Wt 151.7 lb

## 2018-03-03 DIAGNOSIS — E871 Hypo-osmolality and hyponatremia: Secondary | ICD-10-CM

## 2018-03-03 DIAGNOSIS — E041 Nontoxic single thyroid nodule: Secondary | ICD-10-CM | POA: Diagnosis not present

## 2018-03-03 LAB — BASIC METABOLIC PANEL
BUN: 17 mg/dL (ref 6–23)
CO2: 27 mEq/L (ref 19–32)
Calcium: 9.1 mg/dL (ref 8.4–10.5)
Chloride: 91 mEq/L — ABNORMAL LOW (ref 96–112)
Creatinine, Ser: 0.67 mg/dL (ref 0.40–1.20)
GFR: 96.35 mL/min (ref >60.00)
GLUCOSE: 118 mg/dL — AB (ref 70–99)
POTASSIUM: 4.2 meq/L (ref 3.5–5.1)
SODIUM: 126 meq/L — AB (ref 135–145)

## 2018-03-03 NOTE — Telephone Encounter (Signed)
Last OV 11/16/17, Has an appointment today  Last filled 12/05/17, # 51 with 0 refills

## 2018-03-03 NOTE — Progress Notes (Signed)
Subjective:     Patient ID: Gabriela Shepard, female   DOB: 1961-02-03, 57 y.o.   MRN: [id number]  HPI Patient seen for ER and hospital follow-up.  Her recent history is significant for ACDF of C-spine 5/6/7 by Dr. Vertell Limber 1 week ago.  She went home same day.  By Sunday she fell as if she had some fullness in her neck and difficulty swallowing.  She had occasional cough and difficulty coughing up any mucus.  By Monday she went to the emergency room with sensation of increased fluid in her chest.  She did have some component of pleuritic type substernal chest pain along with nausea and weakness and shortness of breath.  She denied any vomiting.  She had labs significant for d-dimer 1.61 and sodium 126.  No thiazide use.  Other labs were stable.  She had CT angiogram negative for PE.  Right lower extremity ultrasound negative for DVT.  Chest x-ray no pneumonia.  CT soft tissue neck revealed some moderate edema felt to be postsurgical but no abscess.  Incidental note of left 1.7 cm thyroid nodule..  Patient was given Decadron and also Medrol Dosepak and advised to use a soft diet and was sent home.  She was actually feeling better until yesterday when she started having somewhat similar though milder symptoms.  She is swallowing fluids okay.  No history of esophageal stricture.  She does have occasional GERD.  She has recently taken some Nexium.  GERD symptoms are fairly stable.  No fever.  No chills.  Mild sore throat.  Denies prior history of hyponatremia.  She has not had any confusion.  Past Medical History:  Diagnosis Date  . Arthritis   . Bowel habit changes    been going on couple years  . Depression   . Flatulence    excessive with strong odor/uncontrollable  . Headache   . 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 History of UTI   . Migraines   . PONV (postoperative nausea and vomiting)   . PTSD (post-traumatic stress disorder)   . Urine incontinence    Past  Surgical History:  Procedure Laterality Date  . BREAST BIOPSY Left    2017 benign x 2  . CESAREAN SECTION  1992  . COLONOSCOPY    . INCONTINENCE SURGERY    . LAPAROSCOPY     under bladder  . NECK SURGERY  02/24/2018   ACDF  . TOTAL KNEE ARTHROPLASTY Left 12/23/2015   Procedure: TOTAL KNEE ARTHROPLASTY;  Surgeon: Melrose Nakayama, MD;  Location: [facility];  Service: Orthopedics;  Laterality: Left;    reports that she quit smoking about 7 years ago. Her smoking use included cigarettes. She has a 5.00 pack-year smoking history. She has never used smokeless tobacco. She reports that she does not drink alcohol or use drugs. family history includes Alcohol abuse in her other; Arthritis in her other; Drug abuse in her sister; Hypertension in her mother and other; Kidney cancer in her father; Mental illness in her other; Ulcers in her father. Allergies  Allergen Reactions  . Sulfa Antibiotics Hives     Review of Systems  Constitutional: Negative for appetite change, chills and fever.  Respiratory: Negative for shortness of breath and wheezing.   Cardiovascular: Negative for chest pain and palpitations.  Gastrointestinal: Negative for abdominal pain.       Objective:   Physical Exam  Constitutional: She appears well-developed and well-nourished.  Neck:  Neck supple.  Surgical wound left side of her neck with some as expected mild swelling but no erythema.  Nontender.  No increased warmth.  No fluctuance.  No crepitus  Cardiovascular: Normal rate and regular rhythm.  Pulmonary/Chest: Effort normal and breath sounds normal. She has no wheezes. She has no rales.  Musculoskeletal: She exhibits no edema.       Assessment:     #1 recent cervical neck surgery as above  #2 hyponatremia with sodium 126.  Question related to decreased intake.  No thiazide or other diuretic use.  No new medications.  #3 incidental note of left thyroid nodule 1.7 cm by recent CT scan    Plan:     -Repeat basic  metabolic panel -Set up ultrasound of neck soft tissue to evaluate possible thyroid nodule as above.  We will wait a few weeks to allow further healing and she has follow-up with her neurosurgeon middle of October.  Eulas Post MD [facility]

## 2018-03-03 NOTE — Patient Instructions (Signed)
Hyponatremia °Hyponatremia is when the amount of salt (sodium) in your blood is too low. When sodium levels are low, your cells absorb extra water and they swell. The swelling happens throughout the body, but it mostly affects the brain. °What are the causes? °This condition may be caused by: °· Heart, kidney, or liver problems. °· Thyroid problems. °· Adrenal gland problems. °· Metabolic conditions, such as syndrome of inappropriate antidiuretic hormone (SIADH). °· Severe vomiting and diarrhea. °· Certain medicines or illegal drugs. °· Dehydration. °· Drinking too much water. °· Eating a diet that is low in sodium. °· Large burns on your body. °· Sweating. ° °What increases the risk? °This condition is more likely to develop in people who: °· Have long-term (chronic) kidney disease. °· Have heart failure. °· Have a medical condition that causes frequent or excessive diarrhea. °· Have metabolic conditions, such as Addison disease or SIADH. °· Take certain medicines that affect the sodium and fluid balance in the blood. Some of these medicine types include: °? Diuretics. °? NSAIDs. °? Some opioid pain medicines. °? Some antidepressants. °? Some seizure prevention medicines. ° °What are the signs or symptoms? °Symptoms of this condition include: °· Nausea and vomiting. °· Confusion. °· Lethargy. °· Agitation. °· Headache. °· Seizures. °· Unconsciousness. °· Appetite loss. °· Muscle weakness and cramping. °· Feeling weak or light-headed. °· Having a rapid heart rate. °· Fainting, in severe cases. ° °How is this diagnosed? °This condition is diagnosed with a medical history and physical exam. You will also have other tests, including: °· Blood tests. °· Urine tests. ° °How is this treated? °Treatment for this condition depends on the cause. Treatment may include: °· Fluids given through an IV tube that is inserted into one of your veins. °· Medicines to correct the sodium imbalance. If medicines are causing the  condition, the medicines will need to be adjusted. °· Limiting water or fluid intake to get the correct sodium balance. ° °Follow these instructions at home: °· Take medicines only as directed by your health care provider. Many medicines can make this condition worse. Talk with your health care provider about any medicines that you are currently taking. °· Carefully follow a recommended diet as directed by your health care provider. °· Carefully follow instructions from your health care provider about fluid restrictions. °· Keep all follow-up visits as directed by your health care provider. This is important. °· Do not drink alcohol. °Contact a health care provider if: °· You develop worsening nausea, fatigue, headache, confusion, or weakness. °· Your symptoms go away and then return. °· You have problems following the recommended diet. °Get help right away if: °· You have a seizure. °· You faint. °· You have ongoing diarrhea or vomiting. °This information is not intended to replace advice given to you by your health care provider. Make sure you discuss any questions you have with your health care provider. °Document Released: 05/14/2002 Document Revised: 10/30/2015 Document Reviewed: 06/13/2014 °Elsevier Interactive Patient Education © 2018 Else[employer] Inc[employer] ° °

## 2018-03-04 NOTE — Telephone Encounter (Signed)
Refill with one additional refill. 

## 2018-03-06 ENCOUNTER — Other Ambulatory Visit: Payer: Self-pay

## 2018-03-06 ENCOUNTER — Encounter: Payer: Self-pay | Admitting: Family Medicine

## 2018-03-06 ENCOUNTER — Telehealth: Payer: Self-pay | Admitting: Family Medicine

## 2018-03-06 DIAGNOSIS — E871 Hypo-osmolality and hyponatremia: Secondary | ICD-10-CM

## 2018-03-06 NOTE — Telephone Encounter (Signed)
Called patient and gave her the lab results. Patient is scheduled tomorrow for lab. Patient stated that she has had severe headaches and she is wondering if this is from her low sodium levels. Please advise.

## 2018-03-06 NOTE — Telephone Encounter (Signed)
Copied from [place] [id number]. Topic: General - Other >> Mar 06, 2018  4:13 PM Mcneil, Ja-Kwan wrote: Reason for CRM: Pt states she received results from the blood work and she needs to speak with the nurse. Pt requests call back. Cb# [phone]

## 2018-03-06 NOTE — Telephone Encounter (Signed)
Possibly.  She should be using some salt on foods, as discussed at visit.  Testing done will help to rule our endocrine cause.

## 2018-03-07 ENCOUNTER — Other Ambulatory Visit: Payer: BLUE CROSS/BLUE SHIELD

## 2018-03-07 ENCOUNTER — Telehealth: Payer: Self-pay | Admitting: *Deleted

## 2018-03-07 DIAGNOSIS — E871 Hypo-osmolality and hyponatremia: Secondary | ICD-10-CM | POA: Diagnosis not present

## 2018-03-07 NOTE — Telephone Encounter (Signed)
Message from Dr. Elease Hashimoto read to patient; verbalizes understanding.  Was not able to make the 10am lab appt scheduled for today, rescheduled for 2pm today.

## 2018-03-07 NOTE — Telephone Encounter (Signed)
Called patient and LMOVM to return call  [place] for [facility] to Discuss results / PCP / recommendations / Schedule patient   Per Dr. Elease Hashimoto:  Possibly.  She should be using some salt on foods, as discussed at visit.  Testing done will help to rule our endocrine cause.  CRM Created.

## 2018-03-09 LAB — OSMOLALITY, URINE: OSMOLALITY UR: 233 mosm/kg (ref 50–1200)

## 2018-03-09 LAB — EXTRA URINE SPECIMEN

## 2018-03-09 LAB — SODIUM, URINE, RANDOM: Sodium, Ur: 28 mmol/L (ref 28–272)

## 2018-03-10 ENCOUNTER — Other Ambulatory Visit: Payer: Self-pay

## 2018-03-10 DIAGNOSIS — E871 Hypo-osmolality and hyponatremia: Secondary | ICD-10-CM

## 2018-03-11 NOTE — Progress Notes (Signed)
[facility] [address], [place] [address] Phone: [phone] Subjective:    I Gabriela Shepard am serving as a Education administrator for Dr. Hulan Saas.   CC: Neck and shoulder pain  [id number]  Gabriela Shepard is a 57 y.o. female coming in with complaint of shoulder pain. Patient saw Dr. Vertell Limber since last visit and had surgery on 9/27. Cleaned out bone spurs, applied cages. Had immediate relief from the numbness and tingling. Since surgery she has had pain in the left scapula. Would like trigger point injections.  Patient feels like her neck pain is somewhat improved.  Still has some tightness in the right shoulder blade.  Responded well to the trigger points previously.      Past Medical History:  Diagnosis Date  . Arthritis   . Bowel habit changes    been going on couple years  . Depression   . Flatulence    excessive with strong odor/uncontrollable  . Headache   . 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 History of UTI   . Migraines   . PONV (postoperative nausea and vomiting)   . PTSD (post-traumatic stress disorder)   . Urine incontinence    Past Surgical History:  Procedure Laterality Date  . BREAST BIOPSY Left    2017 benign x 2  . CESAREAN SECTION  1992  . COLONOSCOPY    . INCONTINENCE SURGERY    . LAPAROSCOPY     under bladder  . NECK SURGERY  02/24/2018   ACDF  . 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 Marital status: Divorced    Spouse name: Not on file  . Number of children: Not on file  . Years of education: Not on file  . Highest education level: Not on file  Occupational History  . Not on file  Social Needs  . Financial resource strain: Not on file  . Food insecurity:    Worry: Not on file    Inability: Not on file  .  Transportation needs:    Medical: Not on file    Non-medical: Not on file  Tobacco Use  . Smoking status: Former Smoker    Packs/day: 0.50    Years: 10.00    Pack years: 5.00    Types: Cigarettes    Last attempt to quit: 11/13/2010    Years since quitting: 7.3  . Smokeless tobacco: Never Used  Substance and Sexual Activity  . Alcohol use: No    Alcohol/week: 0.0 standard drinks    Comment: pt denies.  . Drug use: No  . Sexual activity: Not on file  Lifestyle  . Physical activity:    Days per week: Not on file    Minutes per session: Not on file  . Stress: Not on file  Relationships  . Social connections:    Talks on phone: Not on file    Gets together: Not on file    Attends religious service: Not on file    Active member of club or organization: Not on file    Attends meetings of clubs or organizations: Not on file    Relationship status: Not on file  Other Topics Concern  . Not on file  Social History Narrative  . Not on file  Allergies  Allergen Reactions  . Sulfa Antibiotics Hives   Family History  Problem Relation Age of Onset  . Hypertension Mother   . Alcohol abuse Other   . Arthritis Other   . Hypertension Other   . Mental illness Other   . Kidney cancer Father   . Ulcers Father   . Drug abuse Sister   . Colon cancer Neg Hx     Current Outpatient Medications (Endocrine & Metabolic):  .  levothyroxine (SYNTHROID, LEVOTHROID) 25 MCG tablet, TAKE 1 TABLET (25 MCG TOTAL) BY MOUTH DAILY BEFORE BREAKFAST. .  methylPREDNISolone (MEDROL DOSEPAK) 4 MG TBPK tablet, Take as directed and until completed     Current Outpatient Medications (Respiratory):  .  fluticasone (FLONASE) 50 MCG/ACT nasal spray, Place 2 sprays daily into both nostrils. Marland Kitchen  levocetirizine (XYZAL) 5 MG tablet, TAKE 1 TABLET (5 MG TOTAL) BY MOUTH EVERY EVENING. .  promethazine (PHENERGAN) 25 MG tablet, TAKE 1 TABLET BY MOUTH EVERY 6 HOURS AS NEEDED FOR NAUSEA AND VOMITING (Patient taking  differently: Take 25 mg by mouth every 6 (six) hours as needed for nausea or vomiting. )   Current Outpatient Medications (Analgesics):  Marland Kitchen  Erenumab-aooe (AIMOVIG) 70 MG/ML SOAJ, Inject 70 mLs into the skin every 30 (thirty) days. Marland Kitchen  oxyCODONE-acetaminophen (PERCOCET/ROXICET) 5-325 MG tablet, Take by mouth every 4 (four) hours as needed for moderate pain or severe pain. .  SUMAtriptan (IMITREX) 100 MG tablet, May repeat in 2 hours if headache persists or recurs.     Current Outpatient Medications (Other):  .  b complex vitamins tablet, Take 1 tablet by mouth daily. .  baclofen (LIORESAL) 10 MG tablet, Take 1 tablet (10 mg total) by mouth 3 (three) times daily as needed for muscle spasms. .  Calcium Carbonate-Vit D-Min (CALCIUM 1200 PO), Take 2 tablets by mouth daily. .  Cholecalciferol (VITAMIN D3 PO), Take 1 tablet by mouth daily. .  clonazePAM (KLONOPIN) 1 MG tablet, TAKE 1 TABLET BY MOUTH AT BEDTIME .  esomeprazole (NEXIUM) 40 MG capsule, TAKE 1 CAPSULE BY MOUTH EVERY DAY .  gabapentin (NEURONTIN) 100 MG capsule, Take 2 capsules (200 mg total) by mouth at bedtime. .  hyoscyamine (LEVBID) 0.375 MG 12 hr tablet, TAKE 1 TABLET BY MOUTH TWICE A DAY (Patient taking differently: Take 0.375 mg by mouth 2 (two) times daily. ) .  MAGNESIUM PO, Take 2 tablets by mouth daily. .  methocarbamol (ROBAXIN) 500 MG tablet, Take 500 mg by mouth 4 (four) times daily as needed for pain. .  pramoxine-hydrocortisone (PROCTOCREAM-HC) 1-1 % rectal cream, Place 1 application rectally 2 (two) times daily. .  valACYclovir (VALTREX) 1000 MG tablet, Take 1,000 mg by mouth as needed (outbreak).  .  venlafaxine XR (EFFEXOR XR) 150 MG 24 hr capsule, Take 1 capsule (150 mg total) by mouth daily with breakfast. .  Vitamin D, Ergocalciferol, (DRISDOL) 50000 units CAPS capsule, TAKE 1 CAPSULE (50,000 UNITS TOTAL) BY MOUTH EVERY 7 (SEVEN) DAYS.  Current Facility-Administered Medications (Other):  .  0.9 %  sodium chloride  infusion    Past medical history, social, surgical and family history all reviewed in electronic medical record.  No pertanent information unless stated regarding to the chief complaint.   Review of Systems:  No headache, visual changes, nausea, vomiting, diarrhea, constipation, dizziness, abdominal pain, skin rash, fevers, chills, night sweats, weight loss, swollen lymph nodes, body aches, joint swellingchest pain, shortness of breath, mood changes.  Positive muscle aches  Objective  Blood  pressure 118/82, pulse 85, height 5' 4.5" (1.638 m), weight 156 lb (70.8 kg), SpO2 98 %.    General: No apparent distress alert and oriented x3 mood and affect normal, dressed appropriately.  HEENT: Pupils equal, extraocular movements intact  Respiratory: Patient's speak in full sentences and does not appear short of breath  Cardiovascular: No lower extremity edema, non tender, no erythema  Skin: Warm dry intact with no signs of infection or rash on extremities or on axial skeleton.  Abdomen: Soft nontender  Neuro: Cranial nerves II through XII are intact, neurovascularly intact in all extremities with 2+ DTRs and 2+ pulses.  Lymph: No lymphadenopathy of posterior or anterior cervical chain or axillae bilaterally.  Gait normal with good balance and coordination.  MSK:  Non tender with full range of motion and good stability and symmetric strength and tone of shoulders, elbows, wrist, hip, knee and ankles bilaterally.  Patient's neck exam shows that there is an incision from the anterior cervical surgery recently.  Patient does have a palpable nodule of the thyroid.  Patient does have limited range of motion in all planes of the neck.  Patient has significant tightness of the right trapezius and shoulder musculature.  Multiple trigger points greater than 4 noted in the right shoulder region.  After verbal consent patient was prepped with alcohol swabs and with a 25-gauge half inch needle injected with 0.5  cc of 0.5% Marcaine 1 cc of Kenalog 40 mg/mL no blood loss.  Postinjection instructions given    Impression and Recommendations:     This case required medical decision making of moderate complexity. The above documentation has been reviewed and is accurate and complete Lyndal Pulley, DO       Note: This dictation was prepared with Dragon dictation along with smaller phrase technology. Any transcriptional errors that result from this process are unintentional.

## 2018-03-13 ENCOUNTER — Ambulatory Visit (INDEPENDENT_AMBULATORY_CARE_PROVIDER_SITE_OTHER): Payer: BLUE CROSS/BLUE SHIELD | Admitting: Family Medicine

## 2018-03-13 ENCOUNTER — Encounter: Payer: Self-pay | Admitting: Family Medicine

## 2018-03-13 ENCOUNTER — Other Ambulatory Visit (INDEPENDENT_AMBULATORY_CARE_PROVIDER_SITE_OTHER): Payer: BLUE CROSS/BLUE SHIELD

## 2018-03-13 DIAGNOSIS — M25511 Pain in right shoulder: Secondary | ICD-10-CM | POA: Diagnosis not present

## 2018-03-13 DIAGNOSIS — E871 Hypo-osmolality and hyponatremia: Secondary | ICD-10-CM

## 2018-03-13 LAB — BASIC METABOLIC PANEL
BUN: 9 mg/dL (ref 6–23)
CHLORIDE: 94 meq/L — AB (ref 96–112)
CO2: 29 mEq/L (ref 19–32)
Calcium: 9.3 mg/dL (ref 8.4–10.5)
Creatinine, Ser: 0.67 mg/dL (ref 0.40–1.20)
GFR: 96.34 mL/min (ref >60.00)
Glucose, Bld: 93 mg/dL (ref 70–99)
POTASSIUM: 4.6 meq/L (ref 3.5–5.1)
SODIUM: 131 meq/L — AB (ref 135–145)

## 2018-03-13 NOTE — Patient Instructions (Signed)
Good to see you  Ice is your friend Injected them again to give some relief and should do better after surgery  See me again in 6 weeks

## 2018-03-13 NOTE — Assessment & Plan Note (Signed)
Repeat injection given today.  Discussed facing regimen, home exercise, topical anti-inflammatories.  Discussed the importance of formal physical therapy after patient is released by neurosurgery.  Discussed posture and ergonomics.  Have patient follow-up with the thyroid nodule.  Follow-up with me again in 6 weeks spent  25 minutes with patient face-to-face and had greater than 50% of counseling including as described above in assessment and plan.

## 2018-03-19 ENCOUNTER — Other Ambulatory Visit: Payer: Self-pay | Admitting: Family Medicine

## 2018-03-20 DIAGNOSIS — M5020 Other cervical disc displacement, unspecified cervical region: Secondary | ICD-10-CM | POA: Diagnosis not present

## 2018-03-20 NOTE — Telephone Encounter (Signed)
Refill done.  

## 2018-03-27 ENCOUNTER — Ambulatory Visit
Admission: RE | Admit: 2018-03-27 | Discharge: 2018-03-27 | Disposition: A | Discharge status: Home / Self Care | Payer: BLUE CROSS/BLUE SHIELD | Source: Ambulatory Visit | Attending: Family Medicine | Admitting: Family Medicine

## 2018-03-27 DIAGNOSIS — E041 Nontoxic single thyroid nodule: Secondary | ICD-10-CM | POA: Diagnosis not present

## 2018-04-04 ENCOUNTER — Encounter: Payer: Self-pay | Admitting: Family Medicine

## 2018-04-05 ENCOUNTER — Other Ambulatory Visit: Payer: Self-pay

## 2018-04-05 DIAGNOSIS — Z780 Asymptomatic menopausal state: Secondary | ICD-10-CM

## 2018-04-07 ENCOUNTER — Other Ambulatory Visit: Payer: Self-pay

## 2018-04-07 DIAGNOSIS — Z780 Asymptomatic menopausal state: Secondary | ICD-10-CM

## 2018-04-18 ENCOUNTER — Encounter: Payer: Self-pay | Admitting: Obstetrics and Gynecology

## 2018-04-18 ENCOUNTER — Ambulatory Visit (INDEPENDENT_AMBULATORY_CARE_PROVIDER_SITE_OTHER): Payer: BLUE CROSS/BLUE SHIELD | Admitting: Obstetrics and Gynecology

## 2018-04-18 ENCOUNTER — Other Ambulatory Visit: Payer: Self-pay

## 2018-04-18 ENCOUNTER — Other Ambulatory Visit (HOSPITAL_COMMUNITY)
Admission: RE | Admit: 2018-04-18 | Discharge: 2018-04-18 | Disposition: A | Discharge status: Home / Self Care | Payer: BLUE CROSS/BLUE SHIELD | Source: Ambulatory Visit | Attending: Obstetrics and Gynecology | Admitting: Obstetrics and Gynecology

## 2018-04-18 VITALS — BP 130/88 | HR 80 | Ht 64.57 in | Wt 152.0 lb

## 2018-04-18 DIAGNOSIS — N898 Other specified noninflammatory disorders of vagina: Secondary | ICD-10-CM | POA: Diagnosis not present

## 2018-04-18 DIAGNOSIS — F418 Other specified anxiety disorders: Secondary | ICD-10-CM

## 2018-04-18 DIAGNOSIS — Z124 Encounter for screening for malignant neoplasm of cervix: Secondary | ICD-10-CM | POA: Insufficient documentation

## 2018-04-18 DIAGNOSIS — Z01419 Encounter for gynecological examination (general) (routine) without abnormal findings: Secondary | ICD-10-CM

## 2018-04-18 DIAGNOSIS — N3941 Urge incontinence: Secondary | ICD-10-CM

## 2018-04-18 DIAGNOSIS — N952 Postmenopausal atrophic vaginitis: Secondary | ICD-10-CM

## 2018-04-18 DIAGNOSIS — E2839 Other primary ovarian failure: Secondary | ICD-10-CM

## 2018-04-18 DIAGNOSIS — M8580 Other specified disorders of bone density and structure, unspecified site: Secondary | ICD-10-CM

## 2018-04-18 MED ORDER — VENLAFAXINE HCL ER 75 MG PO CP24: 75.0000 mg | ORAL_CAPSULE | Freq: Every day | ORAL | 1 refills | Status: DC

## 2018-04-18 MED ORDER — ESTRADIOL 10 MCG VA TABS: ORAL_TABLET | VAGINAL | 3 refills | Status: DC

## 2018-04-18 NOTE — Patient Instructions (Addendum)
EXERCISE AND DIET:  We recommended that you start or continue a regular exercise program for good health. Regular exercise means any activity that makes your heart beat faster and makes you sweat.  We recommend exercising at least 30 minutes per day at least 3 days a week, preferably 4 or 5.  We also recommend a diet low in fat and sugar.  Inactivity, poor dietary choices and obesity can cause diabetes, heart attack, stroke, and kidney damage, among others.    ALCOHOL AND SMOKING:  Women should limit their alcohol intake to no more than 7 drinks/beers/glasses of wine (combined, not each!) per week. Moderation of alcohol intake to this level decreases your risk of breast cancer and liver damage. And of course, no recreational drugs are part of a healthy lifestyle.  And absolutely no smoking or even second hand smoke. Most people know smoking can cause heart and lung diseases, but did you know it also contributes to weakening of your bones? Aging of your skin?  Yellowing of your teeth and nails?  CALCIUM AND VITAMIN D:  Adequate intake of calcium and Vitamin D are recommended.  The recommendations for exact amounts of these supplements seem to change often, but generally speaking 1,200 mg of calcium (between diet and supplements) and 800 units of Vitamin D per day seems prudent. Certain women may benefit from higher intake of Vitamin D.  If you are among these women, your doctor will have told you during your visit.    PAP SMEARS:  Pap smears, to check for cervical cancer or precancers,  have traditionally been done yearly, although recent scientific advances have shown that most women can have pap smears less often.  However, every woman still should have a physical exam from her gynecologist every year. It will include a breast check, inspection of the vulva and vagina to check for abnormal growths or skin changes, a visual exam of the cervix, and then an exam to evaluate the size and shape of the uterus and  ovaries.  And after 57 years of age, a rectal exam is indicated to check for rectal cancers. We will also provide age appropriate advice regarding health maintenance, like when you should have certain vaccines, screening for sexually transmitted diseases, bone density testing, colonoscopy, mammograms, etc.   MAMMOGRAMS:  All women over 57 years old old should have a yearly mammogram. Many facilities now offer a "3D" mammogram, which may cost around $50 extra out of pocket. If possible,  we recommend you accept the option to have the 3D mammogram performed.  It both reduces the number of women who will be called back for extra views which then turn out to be normal, and it is better than the routine mammogram at detecting truly abnormal areas.    COLONOSCOPY:  Colonoscopy to screen for colon cancer is recommended for all women at age 88.  We know, you hate the idea of the prep.  We agree, BUT, having colon cancer and not knowing it is worse!!  Colon cancer so often starts as a polyp that can be seen and removed at colonscopy, which can quite literally save your life!  And if your first colonoscopy is normal and you have no family history of colon cancer, most women don't have to have it again for 10 years.  Once every ten years, you can do something that may end up saving your life, right?  We will be happy to help you get it scheduled when you are ready.  Be sure to check your insurance coverage so you understand how much it will cost.  It may be covered as a preventative service at no cost, but you should check your particular policy.      Breast Self-Awareness Breast self-awareness means being familiar with how your breasts look and feel. It involves checking your breasts regularly and reporting any changes to your health care provider. Practicing breast self-awareness is important. A change in your breasts can be a sign of a serious medical problem. Being familiar with how your breasts look and feel allows  you to find any problems early, when treatment is more likely to be successful. All women should practice breast self-awareness, including women who have had breast implants. How to do a breast self-exam One way to learn what is normal for your breasts and whether your breasts are changing is to do a breast self-exam. To do a breast self-exam: Look for Changes  1. Remove all the clothing above your waist. 2. Stand in front of a mirror in a room with good lighting. 3. Put your hands on your hips. 4. Push your hands firmly downward. 5. Compare your breasts in the mirror. Look for differences between them (asymmetry), such as: ? Differences in shape. ? Differences in size. ? Puckers, dips, and bumps in one breast and not the other. 6. Look at each breast for changes in your skin, such as: ? Redness. ? Scaly areas. 7. Look for changes in your nipples, such as: ? Discharge. ? Bleeding. ? Dimpling. ? Redness. ? A change in position. Feel for Changes  Carefully feel your breasts for lumps and changes. It is best to do this while lying on your back on the floor and again while sitting or standing in the shower or tub with soapy water on your skin. Feel each breast in the following way:  Place the arm on the side of the breast you are examining above your head.  Feel your breast with the other hand.  Start in the nipple area and make  inch (2 cm) overlapping circles to feel your breast. Use the pads of your three middle fingers to do this. Apply light pressure, then medium pressure, then firm pressure. The light pressure will allow you to feel the tissue closest to the skin. The medium pressure will allow you to feel the tissue that is a little deeper. The firm pressure will allow you to feel the tissue close to the ribs.  Continue the overlapping circles, moving downward over the breast until you feel your ribs below your breast.  Move one finger-width toward the center of the body.  Continue to use the  inch (2 cm) overlapping circles to feel your breast as you move slowly up toward your collarbone.  Continue the up and down exam using all three pressures until you reach your armpit.  Write Down What You Find  Write down what is normal for each breast and any changes that you find. Keep a written record with breast changes or normal findings for each breast. By writing this information down, you do not need to depend only on memory for size, tenderness, or location. Write down where you are in your menstrual cycle, if you are still menstruating. If you are having trouble noticing differences in your breasts, do not get discouraged. With time you will become more familiar with the variations in your breasts and more comfortable with the exam. How often should I examine my breasts? Examine   your breasts every month. If you are breastfeeding, the best time to examine your breasts is after a feeding or after using a breast pump. If you menstruate, the best time to examine your breasts is 5-7 days after your period is over. During your period, your breasts are lumpier, and it may be more difficult to notice changes. When should I see my health care provider? See your health care provider if you notice:  A change in shape or size of your breasts or nipples.  A change in the skin of your breast or nipples, such as a reddened or scaly area.  Unusual discharge from your nipples.  A lump or thick area that was not there before.  Pain in your breasts.  Anything that concerns you.  This information is not intended to replace advice given to you by your health care provider. Make sure you discuss any questions you have with your health care provider. Document Released: 05/24/2005 Document Revised: 10/30/2015 Document Reviewed: 04/13/2015 Elsevier Interactive Patient Education  2018 [place] Fissure, Adult An anal fissure is a small tear or crack in the skin around  the anus. Bleeding from a fissure usually stops on its own within a few minutes. However, bleeding will often occur again with each bowel movement until the crack heals. What are the causes? This condition may be caused by:  Passing large, hard stool (feces).  Frequent diarrhea.  Constipation.  Inflammatory bowel disease (Crohn disease or ulcerative colitis).  Infections.  Anal sex.  What are the signs or symptoms? Symptoms of this condition include:  Bleeding from the rectum.  Small amounts of blood seen on your stool, on toilet paper, or in the toilet after a bowel movement.  Painful bowel movements.  Itching or irritation around the anus.  How is this diagnosed? A health care provider may diagnose this condition by closely examining the anal area. An anal fissure can usually be seen with careful inspection. In some cases, a rectal exam may be performed, or a short tube (anoscope) may be used to examine the anal canal. How is this treated? Treatment for this condition may include:  Taking steps to avoid constipation. This may include making changes to your diet, such as increasing your intake of fiber or fluid.  Taking fiber supplements. These supplements can soften your stool to help make bowel movements easier. Your health care provider may also prescribe a stool softener if your stool is often hard.  Taking sitz baths. This may help to heal the tear.  Using medicated creams or ointments. These may be prescribed to lessen discomfort.  Follow these instructions at home: Eating and drinking  Avoid foods that may be constipating, such as bananas and dairy products.  Drink enough fluid to keep your urine clear or pale yellow.  Maintain a diet that is high in fruits, whole grains, and vegetables. General instructions  Keep the anal area as clean and dry as possible.  Take sitz baths as told by your health care provider. Do not use soap in the sitz baths.  Take  over-the-counter and prescription medicines only as told by your health care provider.  Use creams or ointments only as told by your health care provider.  Keep all follow-up visits as told by your health care provider. This is important. Contact a health care provider if:  You have more bleeding.  You have a fever.  You have diarrhea that is mixed with blood.  You continue  to have pain.  Your problem is getting worse rather than better. This information is not intended to replace advice given to you by your health care provider. Make sure you discuss any questions you have with your health care provider. Document Released: 05/24/2005 Document Revised: 10/01/2015 Document Reviewed: 08/19/2014 Elsevier Interactive Patient Education  [employer].
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
## 2018-04-19 DIAGNOSIS — M7061 Trochanteric bursitis, right hip: Secondary | ICD-10-CM | POA: Diagnosis not present

## 2018-04-19 DIAGNOSIS — M1611 Unilateral primary osteoarthritis, right hip: Secondary | ICD-10-CM | POA: Diagnosis not present

## 2018-04-19 LAB — URINALYSIS, MICROSCOPIC ONLY: CASTS: NONE SEEN /LPF

## 2018-04-19 LAB — VAGINITIS/VAGINOSIS, DNA PROBE
Candida Species: NEGATIVE
Gardnerella vaginalis: POSITIVE — AB
Trichomonas vaginosis: NEGATIVE

## 2018-04-20 ENCOUNTER — Other Ambulatory Visit: Payer: Self-pay | Admitting: *Deleted

## 2018-04-20 LAB — URINE CULTURE

## 2018-04-20 MED ORDER — METRONIDAZOLE 500 MG PO TABS: 500.0000 mg | ORAL_TABLET | Freq: Two times a day (BID) | ORAL | 0 refills | Status: DC

## 2018-04-21 LAB — CYTOLOGY - PAP
Diagnosis: NEGATIVE
HPV: NOT DETECTED

## 2018-04-24 DIAGNOSIS — F4310 Post-traumatic stress disorder, unspecified: Secondary | ICD-10-CM | POA: Diagnosis not present

## 2018-04-24 DIAGNOSIS — F3340 Major depressive disorder, recurrent, in remission, unspecified: Secondary | ICD-10-CM | POA: Diagnosis not present

## 2018-04-24 DIAGNOSIS — F411 Generalized anxiety disorder: Secondary | ICD-10-CM | POA: Diagnosis not present

## 2018-04-25 ENCOUNTER — Encounter: Payer: Self-pay | Admitting: Obstetrics and Gynecology

## 2018-04-25 ENCOUNTER — Telehealth: Payer: Self-pay | Admitting: Obstetrics and Gynecology

## 2018-04-25 DIAGNOSIS — F332 Major depressive disorder, recurrent severe without psychotic features: Secondary | ICD-10-CM | POA: Diagnosis not present

## 2018-04-25 NOTE — Telephone Encounter (Signed)
Have the patient try senna. She can start with one tablet po BID, can increase to 2 tablets BID. This should make her stool the consistency of tooth paste. If she takes too much she will have diarrhea. Ideally I would like her to have 2 very soft easy BM's a day. Once she has her stools the right consistency it shouldn't hurt to have a BM. She should continue on that dose for 6 weeks, then switch to a fiber supplement.

## 2018-04-25 NOTE — Telephone Encounter (Signed)
Patient sent the following correspondence through [facility]. Routing to triage to assist patient with request.  Hi there. Im having a horrific time healing a tear on my anus. I had referenced in appt last week being a Fischer tear.   I cannot get it healed at all. Im in such pain. [facility] and [facility] something and its not helped. Used some hydrocortisone hemorrhoid stuff over the counter. I have had hemorrhoids flare.   What do I do? Its making me so uncomfortable and its been about ten days. Please. Thank you.

## 2018-04-25 NOTE — Telephone Encounter (Signed)
Left message to call Ulla Mckiernan, RN at [facility] [phone].   

## 2018-04-25 NOTE — Telephone Encounter (Signed)
Spoke with patient. Patient reports anal fissure that is not resolving. Patient states she mentioned this during [place] on 04/18/18, information was provided. Denies bleeding. Reports discomfort when sitting. Small bowel movements, no straining.   Patient has been trying warm water, RX proctocream and a cream she purchased from [facility], no relief.   Patient has seen Dr. Hilarie Fredrickson in the past for GI. Recommended patient f/u with GI for further evaluation. Patient request refill of Proctocream in meantime.   Advised will review with Dr. Talbert Nan and return call, patient agreeable.   Dr. Talbert Nan -please advise on Rx.

## 2018-04-26 ENCOUNTER — Ambulatory Visit (INDEPENDENT_AMBULATORY_CARE_PROVIDER_SITE_OTHER): Payer: BLUE CROSS/BLUE SHIELD | Admitting: Physician Assistant

## 2018-04-26 ENCOUNTER — Other Ambulatory Visit: Payer: Self-pay | Admitting: Obstetrics and Gynecology

## 2018-04-26 ENCOUNTER — Encounter: Payer: Self-pay | Admitting: Physician Assistant

## 2018-04-26 ENCOUNTER — Telehealth: Payer: Self-pay | Admitting: Internal Medicine

## 2018-04-26 ENCOUNTER — Telehealth: Payer: Self-pay | Admitting: *Deleted

## 2018-04-26 VITALS — BP 110/74 | HR 88 | Ht 64.5 in | Wt 155.0 lb

## 2018-04-26 DIAGNOSIS — K5900 Constipation, unspecified: Secondary | ICD-10-CM | POA: Diagnosis not present

## 2018-04-26 DIAGNOSIS — K602 Anal fissure, unspecified: Secondary | ICD-10-CM | POA: Diagnosis not present

## 2018-04-26 DIAGNOSIS — K648 Other hemorrhoids: Secondary | ICD-10-CM

## 2018-04-26 MED ORDER — HYDROCORTISONE ACETATE 25 MG RE SUPP: 25.0000 mg | Freq: Two times a day (BID) | RECTAL | 1 refills | Status: DC

## 2018-04-26 MED ORDER — LIDOCAINE 5 % EX OINT: 1.0000 "application " | TOPICAL_OINTMENT | Freq: Four times a day (QID) | CUTANEOUS | 0 refills | Status: DC | PRN

## 2018-04-26 MED ORDER — AMBULATORY NON FORMULARY MEDICATION: 1 refills | Status: DC

## 2018-04-26 NOTE — Telephone Encounter (Signed)
Pt states she thinks she has an anal fissure and she is also having issues with constipation and hemorrhoids. Requesting to be seen today. Pt scheduled to see Ellouise Newer PA today at 1:45pm. Pt aware of appt.

## 2018-04-26 NOTE — Progress Notes (Addendum)
Chief Complaint: Constipation, anal fissure?  HPI:    Gabriela Shepard is a 57 year old female with a past medical history as listed below, known to Dr. Hilarie Fredrickson for screening colonoscopy, who presents clinic today with complaint of constipation and question of an anal fissure.    05/03/2017 colonoscopy with Dr. Hilarie Fredrickson revealed 2 flat polyps in the ascending colon, 5-8 mm in size, 5 mm polyp in the proximal transverse colon, 5 mm polyp in the distal sigmoid colon and otherwise normal exam.  Pathology showed a mixture of tubular adenomas and hyperplastic polyps.  Repeat was recommended in 3 years.    Today, the patient tells me that she typically runs a little bit constipated and her bowels are never normal as her "emotions go to my stomach".  Patient admits that she has been somewhat constipated over the past couple of months and about a week and a half ago while straining for a bowel movement she had some acute rectal pain that felt like "a tear".  Over the past week and a half she has had even more trouble with bowel movements and when she does pass a stool it is extremely painful.  Also describes continued pain when she is even just sitting still or laying down to sleep at night.  Has been trying senna tea as well as fiber and water but has not had a good bowel movement in the past week, does express a small amount yesterday.  Associated symptoms include abdominal distention and discomfort.    Also discusses trouble with internal hemorrhoids at times, apparently had a prescription for suppositories in the past which worked well.    Denies fever, chills, weight loss, blood in her stool or symptoms that awaken her from sleep.  Past Medical History:  Diagnosis Date  . Arthritis   . Bowel habit changes    been going on couple years  . Depression   . Flatulence    excessive with strong odor/uncontrollable  . Headache   . 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 History of UTI     . Migraines   . PONV (postoperative nausea and vomiting)   . PTSD (post-traumatic stress disorder)   . STD (sexually transmitted disease)   . Substance abuse ([place])   . Urine incontinence     Past Surgical History:  Procedure Laterality Date  . BREAST BIOPSY Left    2017 benign x 2  . CESAREAN SECTION  1992  . COLONOSCOPY    . INCONTINENCE SURGERY    . LAPAROSCOPY     under bladder  . NECK SURGERY  02/24/2018   ACDF  . TOTAL HIP ARTHROPLASTY     left  . TOTAL KNEE ARTHROPLASTY Left 12/23/2015   Procedure: TOTAL KNEE ARTHROPLASTY;  Surgeon: Melrose Nakayama, MD;  Location: [facility];  Service: Orthopedics;  Laterality: Left;    Current Outpatient Medications  Medication Sig Dispense Refill  . b complex vitamins tablet Take 1 tablet by mouth daily.    . baclofen (LIORESAL) 10 MG tablet Take 1 tablet (10 mg total) by mouth 3 (three) times daily as needed for muscle spasms. (Patient not taking: Reported on 04/18/2018) 30 each 0  . Calcium Carbonate-Vit D-Min (CALCIUM 1200 PO) Take 2 tablets by mouth daily.    . Cholecalciferol (VITAMIN D3 PO) Take 1 tablet by mouth daily.    . clonazePAM (KLONOPIN) 1 MG tablet TAKE 1 TABLET BY MOUTH AT BEDTIME 90  tablet 1  . Erenumab-aooe (AIMOVIG) 70 MG/ML SOAJ Inject 70 mLs into the skin every 30 (thirty) days. 1 pen 2  . esomeprazole (NEXIUM) 40 MG capsule TAKE 1 CAPSULE BY MOUTH EVERY DAY 90 capsule 1  . Estradiol 10 MCG TABS vaginal tablet Use one tablet qhs x 1 week, then 2 x a week at hs 24 tablet 3  . fluticasone (FLONASE) 50 MCG/ACT nasal spray Place 2 sprays daily into both nostrils. 16 g 11  . gabapentin (NEURONTIN) 100 MG capsule TAKE 2 CAPSULES (200 MG TOTAL) BY MOUTH AT BEDTIME. 180 capsule 1  . hyoscyamine (LEVBID) 0.375 MG 12 hr tablet TAKE 1 TABLET BY MOUTH TWICE A DAY (Patient taking differently: Take 0.375 mg by mouth 2 (two) times daily. ) 60 tablet 0  . levothyroxine (SYNTHROID, LEVOTHROID) 25 MCG tablet TAKE 1 TABLET (25 MCG TOTAL)  BY MOUTH DAILY BEFORE BREAKFAST. 90 tablet 0  . lidocaine (XYLOCAINE) 5 % ointment Apply 1 application topically 4 (four) times daily as needed. 30 g 0  . MAGNESIUM PO Take 2 tablets by mouth daily.    . methocarbamol (ROBAXIN) 500 MG tablet Take 500 mg by mouth 4 (four) times daily as needed for pain.  0  . metroNIDAZOLE (FLAGYL) 500 MG tablet Take 1 tablet (500 mg total) by mouth 2 (two) times daily. 14 tablet 0  . pramoxine-hydrocortisone (PROCTOCREAM-HC) 1-1 % rectal cream Place 1 application rectally 2 (two) times daily. 30 g 1  . promethazine (PHENERGAN) 25 MG tablet TAKE 1 TABLET BY MOUTH EVERY 6 HOURS AS NEEDED FOR NAUSEA AND VOMITING (Patient taking differently: Take 25 mg by mouth every 6 (six) hours as needed for nausea or vomiting. ) 12 tablet 0  . SUMAtriptan (IMITREX) 100 MG tablet May repeat in 2 hours if headache persists or recurs. 9 tablet 4  . valACYclovir (VALTREX) 1000 MG tablet Take 1,000 mg by mouth as needed (outbreak).   2  . venlafaxine XR (EFFEXOR XR) 75 MG 24 hr capsule Take 1 capsule (75 mg total) by mouth daily with breakfast. 30 capsule 1  . venlafaxine XR (EFFEXOR-XR) 150 MG 24 hr capsule Take 150 mg by mouth daily with breakfast.    . Vitamin D, Ergocalciferol, (DRISDOL) 50000 units CAPS capsule TAKE 1 CAPSULE (50,000 UNITS TOTAL) BY MOUTH EVERY 7 (SEVEN) DAYS. 12 capsule 0   Current Facility-Administered Medications  Medication Dose Route Frequency Provider Last Rate Last Dose  . 0.9 %  sodium chloride infusion  500 mL Intravenous Continuous Pyrtle, Lajuan Lines, MD        Allergies as of 04/26/2018 - Review Complete 04/18/2018  Allergen Reaction Noted  . Sulfa antibiotics Hives 05/12/2011    Family History  Problem Relation Age of Onset  . Hypertension Mother   . Lung cancer Mother   . Alcohol abuse Other   . Arthritis Other   . Hypertension Other   . Mental illness Other   . Kidney cancer Father   . Ulcers Father   . Drug abuse Sister   . Hypertension  Maternal Grandmother   . Colon cancer Neg Hx     Social History   Socioeconomic History  . Marital status: Divorced    Spouse name: Not on file  . Number of children: Not on file  . Years of education: Not on file  . Highest education level: Not on file  Occupational History  . Not on file  Social Needs  . Financial resource strain: Not on  file  . Food insecurity:    Worry: Not on file    Inability: Not on file  . Transportation needs:    Medical: Not on file    Non-medical: Not on file  Tobacco Use  . Smoking status: Former Smoker    Packs/day: 0.50    Years: 10.00    Pack years: 5.00    Types: Cigarettes    Last attempt to quit: 11/13/2010    Years since quitting: 7.4  . Smokeless tobacco: Never Used  Substance and Sexual Activity  . Alcohol use: No  . Drug use: No  . Sexual activity: Not Currently    Birth control/protection: Post-menopausal  Lifestyle  . Physical activity:    Days per week: Not on file    Minutes per session: Not on file  . Stress: Not on file  Relationships  . Social connections:    Talks on phone: Not on file    Gets together: Not on file    Attends religious service: Not on file    Active member of club or organization: Not on file    Attends meetings of clubs or organizations: Not on file    Relationship status: Not on file  . Intimate partner violence:    Fear of current or ex partner: Not on file    Emotionally abused: Not on file    Physically abused: Not on file    Forced sexual activity: Not on file  Other Topics Concern  . Not on file  Social History Narrative  . Not on file    Review of Systems:    Constitutional: No weight loss, fever or chills Cardiovascular: No chest pain Respiratory: No SOB  Gastrointestinal: See HPI and otherwise negative   Physical Exam:  Vital signs: BP 110/74   Pulse 88   Ht 5' 4.5" (1.638 m)   Wt 155 lb (70.3 kg)   BMI 26.19 kg/m    Constitutional:   Pleasant Caucasian female appears to  be in NAD, Well developed, Well nourished, alert and cooperative Respiratory: Respirations even and unlabored. Lungs clear to auscultation bilaterally.   No wheezes, crackles, or rhonchi.  Cardiovascular: Normal S1, S2. No MRG. Regular rate and rhythm. No peripheral edema, cyanosis or pallor.  Gastrointestinal:  Soft, nondistended, nontender. No rebound or guarding. Normal bowel sounds. No appreciable masses or hepatomegaly. Rectal: External: Posterior anal fissure, ttp; Internal: not done due to discomfort  Psychiatric: Demonstrates good judgement and reason without abnormal affect or behaviors.  No recent labs or imaging.  Assessment: 1.  Constipation: Chronic for the patient, but worse over the past month or so, currently using senna tea and fiber with little to no improvement 2.  Anal fissure: For the past week and a half has been experiencing rectal pain, especially with a bowel movement, posterior fissure seen on exam, tender to palpation 3.  Hemorrhoids: Patient also expresses trouble with internal hemorrhoids, we cannot examine these today due to pain, but patient has used prescription suppositories in the past which have helped her  Plan: 1.  Prescribed Nitroglycerin ointment to be applied twice daily to the anus with a gloved finger for 6 to 8 weeks 2.  Recommend the patient buy over-the-counter RectiCare cream with lidocaine and apply as needed for rectal pain 3.  Prescribed Hydrocortisone suppositories twice daily x7 days with 1 refill 4.  Discussed with patient she should start MiraLAX on a daily basis as this has worked for her in the past.  Recommend she start it twice daily initially and then back off as she returns to more normal stools 5.  Patient to follow in clinic with Dr. Hilarie Fredrickson or myself as needed in the future.  Gabriela Newer, PA-C [facility] 04/26/2018, 1:56 PM  Cc: Eulas Post, MD   Addendum: Reviewed and agree with assessment and  management plan. Pyrtle, Lajuan Lines, MD

## 2018-04-26 NOTE — Patient Instructions (Signed)
If you are age 57 or older, your body mass index should be between 23-30. Your Body mass index is 26.19 kg/m. If this is out of the aforementioned range listed, please consider follow up with your Primary Care Provider.  If you are age 56 or younger, your body mass index should be between 19-25. Your Body mass index is 26.19 kg/m. If this is out of the aformentioned range listed, please consider follow up with your Primary Care Provider.   We have sent the following medications to your pharmacy for you to pick up at your convenience: Nitroglycerin Ointment Hydrocortisone Suppositories  Use Recticare with lidocaine as needed. (over-the-counter)  Start Miralax twice daily for now, decrease after things get back to normal.  Thank you for choosing me and [facility].   Ellouise Newer, PA-C

## 2018-04-26 NOTE — Telephone Encounter (Signed)
Patient received message for senna recommendations for anal fissure (see telephone encounter dated 11/19). Patient states she scheduled an OV with GI on 11/22 and had to cancel due to a funeral. She has placed a call to GI to see if she can be worked in.   Patient states the burning is unbearable, making it difficult to sit while at work. Patient requesting Rx to provide "numbing or relief" until GI can see her.   Advised I will review with Dr. Talbert Nan and return call.   Dr. Talbert Nan, please advise.

## 2018-04-26 NOTE — Telephone Encounter (Signed)
Lidocaine sent to her pharmacy.

## 2018-04-26 NOTE — Telephone Encounter (Signed)
Spoke with patient, advised of Rx. Patient verbalizes understanding and is agreeable. Encounter closed.

## 2018-04-26 NOTE — Telephone Encounter (Signed)
Left detailed message, ok per dpr. Advised as seen below per Dr. Talbert Nan. Advised to return call to office if any additional questions. Encounter closed.

## 2018-04-27 ENCOUNTER — Telehealth: Payer: Self-pay | Admitting: Physician Assistant

## 2018-04-27 NOTE — Telephone Encounter (Signed)
Dr. Talbert Nan -patient seen by [facility] on 04/26/18. Please review. Is alternative medication still recommended?

## 2018-04-27 NOTE — Telephone Encounter (Signed)
Please check with the patient.

## 2018-04-27 NOTE — Telephone Encounter (Signed)
Please ask the pharmacist what they recommend as a replacement. It shouldn't be a cream, just a ointment or gel.

## 2018-04-27 NOTE — Telephone Encounter (Signed)
Change request received from the pharmacy:   "Alternative requested: Not covered by insurance. Cost 91 dollars."  Please advise.

## 2018-04-27 NOTE — Telephone Encounter (Signed)
[facility] regarding Nitro ointment.  They will contact patient when prescription is ready.

## 2018-04-28 ENCOUNTER — Ambulatory Visit: Payer: BLUE CROSS/BLUE SHIELD | Admitting: Physician Assistant

## 2018-04-28 NOTE — Telephone Encounter (Signed)
Patient returned call

## 2018-04-28 NOTE — Telephone Encounter (Signed)
Spoke with patient. Patient request alternative medication, has not been able to fill other RX due to cost. Advised I will alternative with pharmacy and return call. Patient agreeable.

## 2018-04-28 NOTE — Telephone Encounter (Signed)
Left message to call Marley Pakula, RN at [facility] [phone].   

## 2018-04-28 NOTE — Telephone Encounter (Signed)
Spoke with Gabriela Shepard at [facility]. Was advised no covered gel or ointment alternative to Lidocaine 5% ointment. Recommended lidocaine 5% gel OTC as alternative.   Call returned to patient. Advised as seen above. Also reviewed option of Good RX coupon at [facility], out of pocket cost $22.04. Patient will try this option. Is aware to return call with any additional questions.  Routing to provider for final review. Patient is agreeable to disposition.

## 2018-05-01 DIAGNOSIS — F332 Major depressive disorder, recurrent severe without psychotic features: Secondary | ICD-10-CM | POA: Diagnosis not present

## 2018-05-01 NOTE — Telephone Encounter (Signed)
Spoke with Gershon Mussel at [facility]. Confirmed only 1 RX for lidocaine 5% ointment on file.

## 2018-05-02 DIAGNOSIS — F332 Major depressive disorder, recurrent severe without psychotic features: Secondary | ICD-10-CM | POA: Diagnosis not present

## 2018-05-03 ENCOUNTER — Ambulatory Visit
Admission: RE | Admit: 2018-05-03 | Discharge: 2018-05-03 | Disposition: A | Discharge status: Home / Self Care | Payer: BLUE CROSS/BLUE SHIELD | Source: Ambulatory Visit | Attending: Obstetrics and Gynecology | Admitting: Obstetrics and Gynecology

## 2018-05-03 DIAGNOSIS — M810 Age-related osteoporosis without current pathological fracture: Secondary | ICD-10-CM | POA: Diagnosis not present

## 2018-05-03 DIAGNOSIS — E2839 Other primary ovarian failure: Secondary | ICD-10-CM

## 2018-05-03 DIAGNOSIS — M8580 Other specified disorders of bone density and structure, unspecified site: Secondary | ICD-10-CM

## 2018-05-03 DIAGNOSIS — M85851 Other specified disorders of bone density and structure, right thigh: Secondary | ICD-10-CM | POA: Diagnosis not present

## 2018-05-03 DIAGNOSIS — M4802 Spinal stenosis, cervical region: Secondary | ICD-10-CM | POA: Diagnosis not present

## 2018-05-03 DIAGNOSIS — F332 Major depressive disorder, recurrent severe without psychotic features: Secondary | ICD-10-CM | POA: Diagnosis not present

## 2018-05-05 ENCOUNTER — Other Ambulatory Visit: Payer: Self-pay | Admitting: Family Medicine

## 2018-05-05 DIAGNOSIS — F332 Major depressive disorder, recurrent severe without psychotic features: Secondary | ICD-10-CM | POA: Diagnosis not present

## 2018-05-08 ENCOUNTER — Telehealth: Payer: Self-pay | Admitting: Obstetrics and Gynecology

## 2018-05-08 DIAGNOSIS — F332 Major depressive disorder, recurrent severe without psychotic features: Secondary | ICD-10-CM | POA: Diagnosis not present

## 2018-05-08 NOTE — Telephone Encounter (Signed)
Left message to call Sharee Pimple, RN at [facility].     Notes recorded by Salvadore Dom, MD on 05/03/2018 at 4:08 PM EST The patient has osteoporosis, please set her up for an appointment to discuss results and treatment options.

## 2018-05-08 NOTE — Telephone Encounter (Signed)
Spoke with patient, advised as seen below per Dr. Talbert Nan. Patient is scheduled for f/u for anxiety and vaginal atrophy with Dr. Talbert Nan on 12/12, rescheduled for 12/12 at 1:30pm to include BMD consult.   Patient verbalizes understanding and is agreeable.   Encounter closed.

## 2018-05-08 NOTE — Telephone Encounter (Signed)
Patient says she is returning Kaitlyn's call. °

## 2018-05-09 DIAGNOSIS — F332 Major depressive disorder, recurrent severe without psychotic features: Secondary | ICD-10-CM | POA: Diagnosis not present

## 2018-05-10 DIAGNOSIS — F332 Major depressive disorder, recurrent severe without psychotic features: Secondary | ICD-10-CM | POA: Diagnosis not present

## 2018-05-11 DIAGNOSIS — F332 Major depressive disorder, recurrent severe without psychotic features: Secondary | ICD-10-CM | POA: Diagnosis not present

## 2018-05-12 ENCOUNTER — Telehealth: Payer: Self-pay | Admitting: *Deleted

## 2018-05-12 ENCOUNTER — Other Ambulatory Visit: Payer: Self-pay | Admitting: Family Medicine

## 2018-05-12 ENCOUNTER — Other Ambulatory Visit: Payer: Self-pay | Admitting: Obstetrics and Gynecology

## 2018-05-12 NOTE — Telephone Encounter (Signed)
Prior auth for Aimovig 70mg /ml auto injector sent to [email or web address] [id number].

## 2018-05-12 NOTE — Telephone Encounter (Signed)
The patient had an adjustment in her dose of Effexor. She has a f/u visit next week. Will decline refill.

## 2018-05-12 NOTE — Telephone Encounter (Signed)
Pharmacy requests for 90 day prescription. Please advise.

## 2018-05-15 DIAGNOSIS — F332 Major depressive disorder, recurrent severe without psychotic features: Secondary | ICD-10-CM | POA: Diagnosis not present

## 2018-05-16 DIAGNOSIS — F332 Major depressive disorder, recurrent severe without psychotic features: Secondary | ICD-10-CM | POA: Diagnosis not present

## 2018-05-16 NOTE — Progress Notes (Signed)
GYNECOLOGY  VISIT   HPI: 58 y.o.   Divorced White or Caucasian Not Hispanic or Latino  female   [phone] with No LMP recorded. Patient is postmenopausal.   here for 1 month follow up on anxiety/depression and vaginal dryness.  Her dose of Effexor was increased at her visit last month. Since then she has started seeing a Psychiatrist, has started [place] (transcranial magnetic stimulation) for her depression. It takes a while for it to work, she has had 12 treatments so far. She has been struggling with the depression, has been suicidal at times, has been isolating herself. Feels safe currently. She is seeing her therapist one x week. See's the Psychiatrist again next week. Needs a refill on her Effexor. She has to function for work, she puts a "mask" on to function.   She was also started on vaginal estrogen for atrophy. Her vagina feels a little better. She hasn't been sexually active.  She is asking about HRT, no hot flashes or night sweats.   GYNECOLOGIC HISTORY: No LMP recorded. Patient is postmenopausal. Contraception: Postmenopausal Menopausal hormone therapy: Estradiol vaginal tablets        OB History    Gravida  2   Para  2   Term  2   Preterm      AB      Living  2     SAB      TAB      Ectopic      Multiple      Live Births  2              Patient Active Problem List   Diagnosis Date Noted  . Weight gain 02/08/2018  . Trigger point of right shoulder region 12/22/2017  . Cervical radiculopathy at C6 11/29/2017  . Osteopenia 03/09/2016  . Primary osteoarthritis of left knee 12/23/2015  . IBS (irritable bowel syndrome) 02/19/2015  . Postmenopausal 12/19/2013  . Migraine headache 12/05/2012  . Pain, upper back 09/24/2010  . Depression 09/04/2010    Past Medical History:  Diagnosis Date  . Arthritis   . Bowel habit changes    been going on couple years  . Depression   . Flatulence    excessive with strong odor/uncontrollable  . Headache   .  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 History of UTI   . Migraines   . PONV (postoperative nausea and vomiting)   . PTSD (post-traumatic stress disorder)   . STD (sexually transmitted disease)   . Substance abuse ([place])   . Urine incontinence     Past Surgical History:  Procedure Laterality Date  . BREAST BIOPSY Left    2017 benign x 2  . CESAREAN SECTION  1992  . COLONOSCOPY    . INCONTINENCE SURGERY    . LAPAROSCOPY     under bladder  . NECK SURGERY  02/24/2018   ACDF  . TOTAL HIP ARTHROPLASTY     left  . TOTAL KNEE ARTHROPLASTY Left 12/23/2015   Procedure: TOTAL KNEE ARTHROPLASTY;  Surgeon: Melrose Nakayama, MD;  Location: [facility];  Service: Orthopedics;  Laterality: Left;    Current Outpatient Medications  Medication Sig Dispense Refill  . AIMOVIG 70 MG/ML SOAJ INJECT 70 MLS INTO THE SKIN EVERY 30 (THIRTY) DAYS. 1 pen 2  . clonazePAM (KLONOPIN) 1 MG tablet TAKE 1 TABLET BY MOUTH AT BEDTIME 90 tablet 1  . esomeprazole (NEXIUM) 40 MG capsule TAKE 1  CAPSULE BY MOUTH EVERY DAY 90 capsule 1  . Estradiol 10 MCG TABS vaginal tablet Use one tablet qhs x 1 week, then 2 x a week at hs 24 tablet 3  . fluticasone (FLONASE) 50 MCG/ACT nasal spray Place 2 sprays daily into both nostrils. 16 g 11  . gabapentin (NEURONTIN) 100 MG capsule TAKE 2 CAPSULES (200 MG TOTAL) BY MOUTH AT BEDTIME. 180 capsule 1  . hyoscyamine (LEVBID) 0.375 MG 12 hr tablet TAKE 1 TABLET BY MOUTH TWICE A DAY (Patient taking differently: Take 0.375 mg by mouth 2 (two) times daily. ) 60 tablet 0  . levothyroxine (SYNTHROID, LEVOTHROID) 25 MCG tablet TAKE 1 TABLET (25 MCG TOTAL) BY MOUTH DAILY BEFORE BREAKFAST. 90 tablet 0  . lidocaine (XYLOCAINE) 5 % ointment APPLY 1 APPLICATION TOPICALLY 4 TIMES DAILY AS NEEDED 35.44 g 0  . MAGNESIUM PO Take 2 tablets by mouth daily.    . methocarbamol (ROBAXIN) 500 MG tablet Take 500 mg by mouth 4 (four) times daily as needed for pain.  0  . promethazine (PHENERGAN)  25 MG tablet TAKE 1 TABLET BY MOUTH EVERY 6 HOURS AS NEEDED FOR NAUSEA AND VOMITING (Patient taking differently: Take 25 mg by mouth every 6 (six) hours as needed for nausea or vomiting. ) 12 tablet 0  . SUMAtriptan (IMITREX) 100 MG tablet May repeat in 2 hours if headache persists or recurs. 9 tablet 4  . valACYclovir (VALTREX) 1000 MG tablet Take 1,000 mg by mouth as needed (outbreak).   2  . venlafaxine XR (EFFEXOR XR) 75 MG 24 hr capsule Take 1 capsule (75 mg total) by mouth daily with breakfast. 30 capsule 1  . venlafaxine XR (EFFEXOR-XR) 150 MG 24 hr capsule Take 150 mg by mouth daily with breakfast.    . Vitamin D, Ergocalciferol, (DRISDOL) 1.25 MG (50000 UT) CAPS capsule TAKE 1 CAPSULE (50,000 UNITS TOTAL) BY MOUTH EVERY 7 (SEVEN) DAYS. 12 capsule 0  . Calcium Carbonate-Vit D-Min (CALCIUM 1200 PO) Take 2 tablets by mouth daily as needed.      No current facility-administered medications for this visit.      ALLERGIES: Sulfa antibiotics  Family History  Problem Relation Age of Onset  . Hypertension Mother   . Lung cancer Mother   . Alcohol abuse Other   . Arthritis Other   . Hypertension Other   . Mental illness Other   . Kidney cancer Father   . Ulcers Father   . Drug abuse Sister   . Hypertension Maternal Grandmother   . Stomach cancer Maternal Uncle   . Colon cancer Neg Hx   . Esophageal cancer Neg Hx     Social History   Socioeconomic History  . Marital status: Divorced    Spouse name: Not on file  . Number of children: Not on file  . Years of education: Not on file  . Highest education level: Not on file  Occupational History  . Not on file  Social Needs  . Financial resource strain: Not on file  . Food insecurity:    Worry: Not on file    Inability: Not on file  . Transportation needs:    Medical: Not on file    Non-medical: Not on file  Tobacco Use  . Smoking status: Former Smoker    Packs/day: 0.50    Years: 10.00    Pack years: 5.00    Types:  Cigarettes    Last attempt to quit: 11/13/2010    Years since  quitting: 7.5  . Smokeless tobacco: Never Used  Substance and Sexual Activity  . Alcohol use: No  . Drug use: No  . Sexual activity: Not Currently    Birth control/protection: Post-menopausal  Lifestyle  . Physical activity:    Days per week: Not on file    Minutes per session: Not on file  . Stress: Not on file  Relationships  . Social connections:    Talks on phone: Not on file    Gets together: Not on file    Attends religious service: Not on file    Active member of club or organization: Not on file    Attends meetings of clubs or organizations: Not on file    Relationship status: Not on file  . Intimate partner violence:    Fear of current or ex partner: Not on file    Emotionally abused: Not on file    Physically abused: Not on file    Forced sexual activity: Not on file  Other Topics Concern  . Not on file  Social History Narrative  . Not on file    Review of Systems  Constitutional:       Weight gain  HENT: Negative.   Eyes: Negative.   Respiratory: Negative.   Cardiovascular: Negative.   Gastrointestinal: Positive for constipation.       Bloating  Genitourinary: Positive for frequency.  Musculoskeletal: Positive for joint pain.  Skin: Negative.   Neurological: Positive for headaches.  Psychiatric/Behavioral: Positive for depression. The patient is nervous/anxious.        Excessive crying SI    PHYSICAL EXAMINATION:    BP 122/84 (BP Location: Right Arm, Patient Position: Sitting, Cuff Size: Normal)   Wt 153 lb 6.4 oz (69.6 kg)   BMI 25.92 kg/m     General appearance: alert, cooperative and appears stated age   Pelvic: External genitalia:  no lesions              Urethra:  normal appearing urethra with no masses, tenderness or lesions              Bartholins and Skenes: normal                 Vagina: mildly atrophic appearing vagina with normal color and discharge, no lesions. Able to  insert 2 fingers 90% (improvement)              Cervix: no lesions              Bimanual Exam:  Uterus:  normal size, contour, position, consistency, mobility, non-tender              Adnexa: no mass, fullness, tenderness                Chaperone was present for exam.  ASSESSMENT Severe depression, anxiety. She is seeing a Teacher, music and therapist, needs a refill on her Wellbutrin Vaginal atrophy improved on vaginal estrogen     PLAN Wellbutrin refilled Continue vaginal estrogen   An After Visit Summary was printed and given to the patient.  ~30 minutes face to face time of which over 50% was spent in counseling.

## 2018-05-17 DIAGNOSIS — F332 Major depressive disorder, recurrent severe without psychotic features: Secondary | ICD-10-CM | POA: Diagnosis not present

## 2018-05-18 ENCOUNTER — Other Ambulatory Visit: Payer: Self-pay

## 2018-05-18 ENCOUNTER — Encounter: Payer: Self-pay | Admitting: Obstetrics and Gynecology

## 2018-05-18 ENCOUNTER — Ambulatory Visit (INDEPENDENT_AMBULATORY_CARE_PROVIDER_SITE_OTHER): Payer: BLUE CROSS/BLUE SHIELD | Admitting: Obstetrics and Gynecology

## 2018-05-18 VITALS — BP 122/84 | HR 80 | Wt 153.4 lb

## 2018-05-18 DIAGNOSIS — N952 Postmenopausal atrophic vaginitis: Secondary | ICD-10-CM

## 2018-05-18 DIAGNOSIS — F418 Other specified anxiety disorders: Secondary | ICD-10-CM

## 2018-05-18 DIAGNOSIS — F332 Major depressive disorder, recurrent severe without psychotic features: Secondary | ICD-10-CM | POA: Diagnosis not present

## 2018-05-18 MED ORDER — VENLAFAXINE HCL ER 75 MG PO CP24: 75.0000 mg | ORAL_CAPSULE | Freq: Every day | ORAL | 3 refills | Status: DC

## 2018-05-18 MED ORDER — VENLAFAXINE HCL ER 150 MG PO CP24: 150.0000 mg | ORAL_CAPSULE | Freq: Every day | ORAL | 3 refills | Status: DC

## 2018-05-19 DIAGNOSIS — M25551 Pain in right hip: Secondary | ICD-10-CM | POA: Diagnosis not present

## 2018-05-19 DIAGNOSIS — D229 Melanocytic nevi, unspecified: Secondary | ICD-10-CM | POA: Diagnosis not present

## 2018-05-19 DIAGNOSIS — L570 Actinic keratosis: Secondary | ICD-10-CM | POA: Diagnosis not present

## 2018-05-19 DIAGNOSIS — F332 Major depressive disorder, recurrent severe without psychotic features: Secondary | ICD-10-CM | POA: Diagnosis not present

## 2018-05-22 ENCOUNTER — Other Ambulatory Visit: Payer: Self-pay | Admitting: Family Medicine

## 2018-05-22 DIAGNOSIS — F332 Major depressive disorder, recurrent severe without psychotic features: Secondary | ICD-10-CM | POA: Diagnosis not present

## 2018-05-23 DIAGNOSIS — F332 Major depressive disorder, recurrent severe without psychotic features: Secondary | ICD-10-CM | POA: Diagnosis not present

## 2018-05-24 DIAGNOSIS — F332 Major depressive disorder, recurrent severe without psychotic features: Secondary | ICD-10-CM | POA: Diagnosis not present

## 2018-05-25 DIAGNOSIS — F332 Major depressive disorder, recurrent severe without psychotic features: Secondary | ICD-10-CM | POA: Diagnosis not present

## 2018-05-26 DIAGNOSIS — M533 Sacrococcygeal disorders, not elsewhere classified: Secondary | ICD-10-CM | POA: Diagnosis not present

## 2018-05-26 DIAGNOSIS — F332 Major depressive disorder, recurrent severe without psychotic features: Secondary | ICD-10-CM | POA: Diagnosis not present

## 2018-05-26 DIAGNOSIS — M6281 Muscle weakness (generalized): Secondary | ICD-10-CM | POA: Diagnosis not present

## 2018-05-29 DIAGNOSIS — F332 Major depressive disorder, recurrent severe without psychotic features: Secondary | ICD-10-CM | POA: Diagnosis not present

## 2018-05-29 NOTE — Telephone Encounter (Signed)
Gabriela Shepard called from [email or web address] stating there are additional questions that are required.  These were completed and submitted electronically.

## 2018-05-30 DIAGNOSIS — M533 Sacrococcygeal disorders, not elsewhere classified: Secondary | ICD-10-CM | POA: Diagnosis not present

## 2018-05-30 DIAGNOSIS — M6281 Muscle weakness (generalized): Secondary | ICD-10-CM | POA: Diagnosis not present

## 2018-06-05 DIAGNOSIS — M6281 Muscle weakness (generalized): Secondary | ICD-10-CM | POA: Diagnosis not present

## 2018-06-05 DIAGNOSIS — M533 Sacrococcygeal disorders, not elsewhere classified: Secondary | ICD-10-CM | POA: Diagnosis not present

## 2018-06-06 DIAGNOSIS — M533 Sacrococcygeal disorders, not elsewhere classified: Secondary | ICD-10-CM | POA: Diagnosis not present

## 2018-06-06 DIAGNOSIS — M6281 Muscle weakness (generalized): Secondary | ICD-10-CM | POA: Diagnosis not present

## 2018-06-09 NOTE — Telephone Encounter (Signed)
Fax received from [facility] stating the request was denied and this was given to Dr Burchette's asst.

## 2018-06-09 NOTE — Telephone Encounter (Signed)
Please see messages. Paperwork placed in red folder.

## 2018-06-13 DIAGNOSIS — F332 Major depressive disorder, recurrent severe without psychotic features: Secondary | ICD-10-CM | POA: Diagnosis not present

## 2018-06-14 DIAGNOSIS — F332 Major depressive disorder, recurrent severe without psychotic features: Secondary | ICD-10-CM | POA: Diagnosis not present

## 2018-06-15 DIAGNOSIS — F332 Major depressive disorder, recurrent severe without psychotic features: Secondary | ICD-10-CM | POA: Diagnosis not present

## 2018-06-15 NOTE — Telephone Encounter (Signed)
Did we find out who prescribed the Aimovig initially?

## 2018-06-16 DIAGNOSIS — F332 Major depressive disorder, recurrent severe without psychotic features: Secondary | ICD-10-CM | POA: Diagnosis not present

## 2018-06-19 DIAGNOSIS — F332 Major depressive disorder, recurrent severe without psychotic features: Secondary | ICD-10-CM | POA: Diagnosis not present

## 2018-06-19 NOTE — Telephone Encounter (Signed)
My Chart message has been sent to patient to see who started prescribing the [place].

## 2018-06-20 DIAGNOSIS — F332 Major depressive disorder, recurrent severe without psychotic features: Secondary | ICD-10-CM | POA: Diagnosis not present

## 2018-06-21 DIAGNOSIS — F332 Major depressive disorder, recurrent severe without psychotic features: Secondary | ICD-10-CM | POA: Diagnosis not present

## 2018-06-22 DIAGNOSIS — F332 Major depressive disorder, recurrent severe without psychotic features: Secondary | ICD-10-CM | POA: Diagnosis not present

## 2018-06-23 DIAGNOSIS — F332 Major depressive disorder, recurrent severe without psychotic features: Secondary | ICD-10-CM | POA: Diagnosis not present

## 2018-06-26 DIAGNOSIS — F332 Major depressive disorder, recurrent severe without psychotic features: Secondary | ICD-10-CM | POA: Diagnosis not present

## 2018-06-26 NOTE — Progress Notes (Signed)
[name] [place], [place] [address] Phone: [phone] Subjective:    I [name] am serving as a Education administrator for Dr. Hulan Saas.   CC: Neck pain follow-up  IHK:[id number]  Gabriela Shepard is a 58 y.o. female coming in with complaint of neck pain follow-up.  Has responded in trigger point injections previously.  MRI of cervical spine that has been independently visualized by me showing significant foraminal narrowing at multiple levels.  Patient states that her neck is a little painful. Has increased activity which has caused pain. States that she has a "weird" rash on her face and back. Has been getting TMS (transcrainal magnetic stimulation). Now at the end of her treatment that has her anxiety up.      Last cervical epidural was in August 2019  Past Medical History:  Diagnosis Date  . Arthritis   . Bowel habit changes    been going on couple years  . Depression   . Flatulence    excessive with strong odor/uncontrollable  . Headache   . 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 History of UTI   . Migraines   . PONV (postoperative nausea and vomiting)   . PTSD (post-traumatic stress disorder)   . STD (sexually transmitted disease)   . Substance abuse ([place])   . Urine incontinence    Past Surgical History:  Procedure Laterality Date  . BREAST BIOPSY Left    2017 benign x 2  . CESAREAN SECTION  1992  . COLONOSCOPY    . INCONTINENCE SURGERY    . LAPAROSCOPY     under bladder  . NECK SURGERY  02/24/2018   ACDF  . TOTAL HIP ARTHROPLASTY     left  . TOTAL KNEE ARTHROPLASTY Left 12/23/2015   Procedure: TOTAL KNEE ARTHROPLASTY;  Surgeon: Melrose Nakayama, MD;  Location: [facility];  Service: Orthopedics;  Laterality: Left;   Social History   Socioeconomic History  . Marital status: Divorced    Spouse name: Not on file  . Number of children: Not on file  . Years of education: Not on file  . Highest education  level: Not on file  Occupational History  . Not on file  Social Needs  . Financial resource strain: Not on file  . Food insecurity:    Worry: Not on file    Inability: Not on file  . Transportation needs:    Medical: Not on file    Non-medical: Not on file  Tobacco Use  . Smoking status: Former Smoker    Packs/day: 0.50    Years: 10.00    Pack years: 5.00    Types: Cigarettes    Last attempt to quit: 11/13/2010    Years since quitting: 7.6  . Smokeless tobacco: Never Used  Substance and Sexual Activity  . Alcohol use: No  . Drug use: No  . Sexual activity: Not Currently    Birth control/protection: Post-menopausal  Lifestyle  . Physical activity:    Days per week: Not on file    Minutes per session: Not on file  . Stress: Not on file  Relationships  . Social connections:    Talks on phone: Not on file    Gets together: Not on file    Attends religious service: Not on file    Active member of club or organization: Not on file    Attends meetings of clubs or organizations:  Not on file    Relationship status: Not on file  Other Topics Concern  . Not on file  Social History Narrative  . Not on file   Allergies  Allergen Reactions  . Sulfa Antibiotics Hives   Family History  Problem Relation Age of Onset  . Hypertension Mother   . Lung cancer Mother   . Alcohol abuse Other   . Arthritis Other   . Hypertension Other   . Mental illness Other   . Kidney cancer Father   . Ulcers Father   . Drug abuse Sister   . Hypertension Maternal Grandmother   . Stomach cancer Maternal Uncle   . Colon cancer Neg Hx   . Esophageal cancer Neg Hx     Current Outpatient Medications (Endocrine & Metabolic):  .  levothyroxine (SYNTHROID, LEVOTHROID) 25 MCG tablet, TAKE 1 TABLET BY MOUTH DAILY BEFORE BREAKFAST 03/02/18   Current Outpatient Medications (Respiratory):  .  fluticasone (FLONASE) 50 MCG/ACT nasal spray, Place 2 sprays daily into both nostrils. .  promethazine  (PHENERGAN) 25 MG tablet, TAKE 1 TABLET BY MOUTH EVERY 6 HOURS AS NEEDED FOR NAUSEA AND VOMITING  Current Outpatient Medications (Analgesics):  Marland Kitchen  AIMOVIG 70 MG/ML SOAJ, INJECT 70 MLS INTO THE SKIN EVERY 30 (THIRTY) DAYS. Marland Kitchen  SUMAtriptan (IMITREX) 100 MG tablet, May repeat in 2 hours if headache persists or recurs.   Current Outpatient Medications (Other):  Marland Kitchen  Calcium Carbonate-Vit D-Min (CALCIUM 1200 PO), Take 2 tablets by mouth daily as needed.  .  clonazePAM (KLONOPIN) 1 MG tablet, TAKE 1 TABLET BY MOUTH AT BEDTIME .  esomeprazole (NEXIUM) 40 MG capsule, TAKE 1 CAPSULE BY MOUTH EVERY DAY .  Estradiol 10 MCG TABS vaginal tablet, Use one tablet qhs x 1 week, then 2 x a week at hs .  gabapentin (NEURONTIN) 100 MG capsule, TAKE 2 CAPSULES (200 MG TOTAL) BY MOUTH AT BEDTIME. .  hyoscyamine (LEVBID) 0.375 MG 12 hr tablet, TAKE 1 TABLET BY MOUTH TWICE A DAY (Patient taking differently: Take 0.375 mg by mouth 2 (two) times daily. ) .  lidocaine (XYLOCAINE) 5 % ointment, APPLY 1 APPLICATION TOPICALLY 4 TIMES DAILY AS NEEDED .  MAGNESIUM PO, Take 2 tablets by mouth daily. .  methocarbamol (ROBAXIN) 500 MG tablet, Take 500 mg by mouth 4 (four) times daily as needed for pain. .  valACYclovir (VALTREX) 1000 MG tablet, Take 1,000 mg by mouth as needed (outbreak).  .  venlafaxine XR (EFFEXOR XR) 75 MG 24 hr capsule, Take 1 capsule (75 mg total) by mouth daily with breakfast. .  venlafaxine XR (EFFEXOR-XR) 150 MG 24 hr capsule, Take 1 capsule (150 mg total) by mouth daily with breakfast. .  Vitamin D, Ergocalciferol, (DRISDOL) 1.25 MG (50000 UT) CAPS capsule, TAKE 1 CAPSULE (50,000 UNITS TOTAL) BY MOUTH EVERY 7 (SEVEN) DAYS. .  hydrOXYzine (ATARAX/VISTARIL) 10 MG tablet, Take 1 tablet (10 mg total) by mouth 3 (three) times daily as needed for anxiety. .  traZODone (DESYREL) 50 MG tablet, Take 0.5-1 tablets (25-50 mg total) by mouth at bedtime as needed for sleep.    Past medical history, social, surgical  and family history all reviewed in electronic medical record.  No pertanent information unless stated regarding to the chief complaint.   Review of Systems:  No, visual changes, nausea, vomiting, diarrhea, constipation, dizziness, abdominal pain, skin rash, fevers, chills, night sweats, weight loss, swollen lymph nodes,  chest pain, shortness of breath,   Positive increase in anxiety, muscle  aches, body aches, headaches  Objective  Blood pressure (!) 150/80, pulse 90, height 5' 4.5" (1.638 m), weight 155 lb (70.3 kg), SpO2 98 %.    General: No apparent distress alert and oriented x3 mood and affect normal, dressed appropriately.  HEENT: Pupils equal, extraocular movements intact  Respiratory: Patient's speak in full sentences and does not appear short of breath  Cardiovascular: No lower extremity edema, non tender, no erythema  Skin: Warm dry intact with no signs of infection or rash on extremities or on axial skeleton.  Abdomen: Soft nontender  Neuro: Cranial nerves II through XII are intact, neurovascularly intact in all extremities with 2+ DTRs and 2+ pulses.  Lymph: No lymphadenopathy of posterior or anterior cervical chain or axillae bilaterally.  Gait normal with good balance and coordination.  MSK:  Non tender with full range of motion and good stability and symmetric strength and tone of shoulders, elbows, wrist, hip, knee and ankles bilaterally.  Neck: Inspection unremarkable. No palpable stepoffs. Negative Spurling's maneuver. Mild limited range of motion lacking 5 degrees of sidebending bilaterally. Grip strength and sensation normal in bilateral hands Strength good C4 to T1 distribution No sensory change to C4 to T1 Negative Hoffman sign bilaterally Reflexes normal  Tightness of the right trapezius noted.  Multiple trigger points noted  After verbal consent patient was prepped with alcohol swabs and with a 25-gauge half inch needle injected and 4 distinct trigger points  along the right shoulder region and periscapular region.  Total of 3 cc of 0.5% Marcaine and 1 cc of Kenalog 40 mg/mL no blood loss.  Band-Aid placed.  Postinjection instructions given  Impression and Recommendations:     This case required medical decision making of moderate complexity. The above documentation has been reviewed and is accurate and complete Lyndal Pulley, DO       Note: This dictation was prepared with Dragon dictation along with smaller phrase technology. Any transcriptional errors that result from this process are unintentional.

## 2018-06-27 ENCOUNTER — Ambulatory Visit (INDEPENDENT_AMBULATORY_CARE_PROVIDER_SITE_OTHER): Payer: BLUE CROSS/BLUE SHIELD | Admitting: Family Medicine

## 2018-06-27 ENCOUNTER — Encounter: Payer: Self-pay | Admitting: Family Medicine

## 2018-06-27 VITALS — BP 150/80 | HR 90 | Ht 64.5 in | Wt 155.0 lb

## 2018-06-27 DIAGNOSIS — M25511 Pain in right shoulder: Secondary | ICD-10-CM

## 2018-06-27 DIAGNOSIS — M5412 Radiculopathy, cervical region: Secondary | ICD-10-CM

## 2018-06-27 MED ORDER — TRAZODONE HCL 50 MG PO TABS: 25.0000 mg | ORAL_TABLET | Freq: Every evening | ORAL | 3 refills | Status: DC | PRN

## 2018-06-27 MED ORDER — HYDROXYZINE HCL 10 MG PO TABS: 10.0000 mg | ORAL_TABLET | Freq: Three times a day (TID) | ORAL | 1 refills | Status: DC | PRN

## 2018-06-27 NOTE — Patient Instructions (Signed)
Good to see you  Clean the phone  Tried trigger point injections today  Exercises 3 times a week.  Alternate the back and the SI joint.  Stay active Hydroxyzine up to 3 times a day for anxietry  Trazodone at night to help with sleep  Continue all other meds but maybe back off on the gabapentin a little See me again in 1-2 months

## 2018-06-27 NOTE — Assessment & Plan Note (Signed)
Repeat injection given today.  I do believe that patient's underlying cervical radiculopathy could be contributing.  Patient known to declined any type of epidural again.  Patient has increasing anxiety and did make some changes in some medications that I think will be beneficial.  I do believe that this is playing a role in some of her upper back pain as well.  Patient wants to continue with the home exercises and declined formal physical therapy at the moment.  Discussed icing regimen.  Follow-up with me again in 4 to 8 weeks spent  25 minutes with patient face-to-face and had greater than 50% of counseling including as described above in assessment and plan.

## 2018-06-28 DIAGNOSIS — F332 Major depressive disorder, recurrent severe without psychotic features: Secondary | ICD-10-CM | POA: Diagnosis not present

## 2018-07-04 DIAGNOSIS — F332 Major depressive disorder, recurrent severe without psychotic features: Secondary | ICD-10-CM | POA: Diagnosis not present

## 2018-07-30 DIAGNOSIS — J0140 Acute pansinusitis, unspecified: Secondary | ICD-10-CM | POA: Diagnosis not present

## 2018-07-30 DIAGNOSIS — R197 Diarrhea, unspecified: Secondary | ICD-10-CM | POA: Diagnosis not present

## 2018-08-08 ENCOUNTER — Telehealth: Payer: Self-pay | Admitting: *Deleted

## 2018-08-08 NOTE — Telephone Encounter (Signed)
Called patient and LMOVM to return call  [place] for [facility] to Discuss results / PCP / recommendations / Schedule patient  Called patient in January 2020 and was not able to leave a voice message due to full voice mail. Also sent a MyChart message in January. Dr. Elease Hashimoto needs to know who sent the Aimovig in originally? Please let us know. Looks like we need to send in a prior authorization but I will send the info to Dr. Elease Hashimoto first then see what we need to do.  CRM Created.

## 2018-08-08 NOTE — Telephone Encounter (Signed)
Copied from [place] [phone]. Topic: General - Other >> Aug 08, 2018  2:05 PM Oneta Rack wrote:  Relation to pt: self Call back number: [phone] Pharmacy: [id number] [place], [place] (Phone) [phone] (Fax)  Reason for call:  Patient checking on the status of AIMOVIG 70 MG/ML SOAJ request, pharmacy advised several request have been sent in, pease advise

## 2018-08-08 NOTE — Telephone Encounter (Signed)
Please advise 

## 2018-08-09 ENCOUNTER — Encounter: Payer: Self-pay | Admitting: Family Medicine

## 2018-08-09 ENCOUNTER — Ambulatory Visit (INDEPENDENT_AMBULATORY_CARE_PROVIDER_SITE_OTHER): Payer: BLUE CROSS/BLUE SHIELD | Admitting: Family Medicine

## 2018-08-09 ENCOUNTER — Telehealth: Payer: Self-pay

## 2018-08-09 DIAGNOSIS — G5701 Lesion of sciatic nerve, right lower limb: Secondary | ICD-10-CM | POA: Diagnosis not present

## 2018-08-09 DIAGNOSIS — M5412 Radiculopathy, cervical region: Secondary | ICD-10-CM | POA: Diagnosis not present

## 2018-08-09 MED ORDER — TRAZODONE HCL 100 MG PO TABS: 100.0000 mg | ORAL_TABLET | Freq: Every evening | ORAL | 1 refills | Status: DC | PRN

## 2018-08-09 MED ORDER — MONTELUKAST SODIUM 10 MG PO TABS: 10.0000 mg | ORAL_TABLET | Freq: Every day | ORAL | 3 refills | Status: DC

## 2018-08-09 NOTE — Assessment & Plan Note (Signed)
Has had radicular symptoms previously.  Has responded decently to more of trigger point injections.  Doing okay with the neck pain at the moment and seems to be stable.  Discussed with patient about home exercises, icing regimen, as well as posture and ergonomics throughout the day.

## 2018-08-09 NOTE — Telephone Encounter (Signed)
Patient called back, Pt states that Dr. Amil Amen with [facility], a neurologist originally  prescribed this medication for her. Aimovig  Please advise.  Copied from [place] [phone]. Topic: Conservator, museum/gallery Patient (Clinic Use ONLY) >> Aug 08, 2018  4:02 PM Anibal Henderson, CMA wrote: Reason for CRM: Called patient and LMOVM to return call  [place] for  [facility] to Discuss results / PCP / recommendations / Schedule patient  Called patient in January 2020 and was not able to leave a voice message due to full voice mail. Also sent a MyChart message in January. Dr. Elease Hashimoto needs to know who sent the Aimovig in originally? Please let us know. Looks like we need to send in a prior authorization but I will send the info to Dr. Elease Hashimoto first then see what we need to do. >> Aug 08, 2018  4:29 PM Bea Graff, NT wrote: Pt states that Dr. Amil Amen with [facility], a neurologist originally  prescribed this medication for her.

## 2018-08-09 NOTE — Patient Instructions (Signed)
Good to see you  Bumped up the trazadone to 100mg  at night Singulair also at night may help with allergies.  Exercises 3 times a week.  Tennis ball in back right pocket with a lot of sitting.  See me again in 6 weeks

## 2018-08-09 NOTE — Progress Notes (Signed)
[name] [place], [place] [address] Phone: [phone] Subjective:    I'm seeing this patient by the request  of:    CC: Neck pain  [id number]    Update 08/09/2018: Gabriela Shepard is a 58 y.o. female coming in with complaint of neck pain. Patient states that she has been really stressed. She has been using trazedone for sleeping which is not helping. She did have relief from the trigger point injections.  Patient would state that she is feeling 85% better.  Patient is also complaining of right glute pain that increases when she is lying down.  Seems to be worse with sitting for long amount of time.  When she starts increasing activity seems to get better.  Patient states that it seems to be in the right buttocks area.  Some mild radiation down the leg.  No significant weakness though noted.  Past Medical History:  Diagnosis Date  . Arthritis   . Bowel habit changes    been going on couple years  . Depression   . Flatulence    excessive with strong odor/uncontrollable  . Headache   . 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 History of UTI   . Migraines   . PONV (postoperative nausea and vomiting)   . PTSD (post-traumatic stress disorder)   . STD (sexually transmitted disease)   . Substance abuse ([place])   . Urine incontinence    Past Surgical History:  Procedure Laterality Date  . BREAST BIOPSY Left    2017 benign x 2  . CESAREAN SECTION  1992  . COLONOSCOPY    . INCONTINENCE SURGERY    . LAPAROSCOPY     under bladder  . NECK SURGERY  02/24/2018   ACDF  . TOTAL HIP ARTHROPLASTY     left  . TOTAL KNEE ARTHROPLASTY Left 12/23/2015   Procedure: TOTAL KNEE ARTHROPLASTY;  Surgeon: Melrose Nakayama, MD;  Location: [facility];  Service: Orthopedics;  Laterality: Left;   Social History   Socioeconomic History  . Marital status: Divorced    Spouse name: Not on file  . Number of children: Not on file  . Years of  education: Not on file  . Highest education level: Not on file  Occupational History  . Not on file  Social Needs  . Financial resource strain: Not on file  . Food insecurity:    Worry: Not on file    Inability: Not on file  . Transportation needs:    Medical: Not on file    Non-medical: Not on file  Tobacco Use  . Smoking status: Former Smoker    Packs/day: 0.50    Years: 10.00    Pack years: 5.00    Types: Cigarettes    Last attempt to quit: 11/13/2010    Years since quitting: 7.7  . Smokeless tobacco: Never Used  Substance and Sexual Activity  . Alcohol use: No  . Drug use: No  . Sexual activity: Not Currently    Birth control/protection: Post-menopausal  Lifestyle  . Physical activity:    Days per week: Not on file    Minutes per session: Not on file  . Stress: Not on file  Relationships  . Social connections:    Talks on phone: Not on file    Gets together: Not on file    Attends religious service: Not on file    Active member of  club or organization: Not on file    Attends meetings of clubs or organizations: Not on file    Relationship status: Not on file  Other Topics Concern  . Not on file  Social History Narrative  . Not on file   Allergies  Allergen Reactions  . Sulfa Antibiotics Hives   Family History  Problem Relation Age of Onset  . Hypertension Mother   . Lung cancer Mother   . Alcohol abuse Other   . Arthritis Other   . Hypertension Other   . Mental illness Other   . Kidney cancer Father   . Ulcers Father   . Drug abuse Sister   . Hypertension Maternal Grandmother   . Stomach cancer Maternal Uncle   . Colon cancer Neg Hx   . Esophageal cancer Neg Hx     Current Outpatient Medications (Endocrine & Metabolic):  .  levothyroxine (SYNTHROID, LEVOTHROID) 25 MCG tablet, TAKE 1 TABLET BY MOUTH DAILY BEFORE BREAKFAST 03/02/18   Current Outpatient Medications (Respiratory):  .  fluticasone (FLONASE) 50 MCG/ACT nasal spray, Place 2 sprays daily  into both nostrils. .  promethazine (PHENERGAN) 25 MG tablet, TAKE 1 TABLET BY MOUTH EVERY 6 HOURS AS NEEDED FOR NAUSEA AND VOMITING  Current Outpatient Medications (Analgesics):  Marland Kitchen  AIMOVIG 70 MG/ML SOAJ, INJECT 70 MLS INTO THE SKIN EVERY 30 (THIRTY) DAYS. Marland Kitchen  SUMAtriptan (IMITREX) 100 MG tablet, May repeat in 2 hours if headache persists or recurs.   Current Outpatient Medications (Other):  Marland Kitchen  Calcium Carbonate-Vit D-Min (CALCIUM 1200 PO), Take 2 tablets by mouth daily as needed.  .  clonazePAM (KLONOPIN) 1 MG tablet, TAKE 1 TABLET BY MOUTH AT BEDTIME .  esomeprazole (NEXIUM) 40 MG capsule, TAKE 1 CAPSULE BY MOUTH EVERY DAY .  Estradiol 10 MCG TABS vaginal tablet, Use one tablet qhs x 1 week, then 2 x a week at hs .  gabapentin (NEURONTIN) 100 MG capsule, TAKE 2 CAPSULES (200 MG TOTAL) BY MOUTH AT BEDTIME. .  hydrOXYzine (ATARAX/VISTARIL) 10 MG tablet, Take 1 tablet (10 mg total) by mouth 3 (three) times daily as needed for anxiety. .  hyoscyamine (LEVBID) 0.375 MG 12 hr tablet, TAKE 1 TABLET BY MOUTH TWICE A DAY (Patient taking differently: Take 0.375 mg by mouth 2 (two) times daily. ) .  lidocaine (XYLOCAINE) 5 % ointment, APPLY 1 APPLICATION TOPICALLY 4 TIMES DAILY AS NEEDED .  MAGNESIUM PO, Take 2 tablets by mouth daily. .  methocarbamol (ROBAXIN) 500 MG tablet, Take 500 mg by mouth 4 (four) times daily as needed for pain. .  traZODone (DESYREL) 50 MG tablet, Take 0.5-1 tablets (25-50 mg total) by mouth at bedtime as needed for sleep. .  valACYclovir (VALTREX) 1000 MG tablet, Take 1,000 mg by mouth as needed (outbreak).  .  venlafaxine XR (EFFEXOR XR) 75 MG 24 hr capsule, Take 1 capsule (75 mg total) by mouth daily with breakfast. .  venlafaxine XR (EFFEXOR-XR) 150 MG 24 hr capsule, Take 1 capsule (150 mg total) by mouth daily with breakfast. .  Vitamin D, Ergocalciferol, (DRISDOL) 1.25 MG (50000 UT) CAPS capsule, TAKE 1 CAPSULE (50,000 UNITS TOTAL) BY MOUTH EVERY 7 (SEVEN)  DAYS.    Past medical history, social, surgical and family history all reviewed in electronic medical record.  No pertanent information unless stated regarding to the chief complaint.   Review of Systems:  No headache, visual changes, nausea, vomiting, diarrhea, constipation, dizziness, abdominal pain, skin rash, fevers, chills, night sweats, weight loss,  swollen lymph nodes, body aches, joint swelling, muscle aches, chest pain, shortness of breath, mood changes.   Objective  There were no vitals taken for this visit. Systems examined below as of    General: No apparent distress alert and oriented x3 mood and affect normal, dressed appropriately.  HEENT: Pupils equal, extraocular movements intact  Respiratory: Patient's speak in full sentences and does not appear short of breath  Cardiovascular: No lower extremity edema, non tender, no erythema  Skin: Warm dry intact with no signs of infection or rash on extremities or on axial skeleton.  Abdomen: Soft nontender  Neuro: Cranial nerves II through XII are intact, neurovascularly intact in all extremities with 2+ DTRs and 2+ pulses.  Lymph: No lymphadenopathy of posterior or anterior cervical chain or axillae bilaterally.  Gait normal with good balance and coordination.  MSK:  Non tender with full range of motion and good stability and symmetric strength and tone of shoulders, elbows, wrist, hip, knee and ankles bilaterally.  Neck: Inspection unremarkable. No palpable stepoffs. Negative Spurling's maneuver. Full neck range of motion Grip strength and sensation normal in bilateral hands Strength good C4 to T1 distribution No sensory change to C4 to T1 Negative Hoffman sign bilaterally Reflexes normal Tightness of the right trapezius noted but improved.  Right hip exam shows the patient has near full range of motion but a positive Faber test.  Negative straight leg test.  Patient has minimal tenderness over the sacroiliac joint and the  piriformis muscle on the right side.  Otherwise exam fairly unremarkable.  [id number]; 15 additional minutes spent for Therapeutic exercises as stated in above notes.  This included exercises focusing on stretching, strengthening, with significant focus on eccentric aspects.   Long term goals include an improvement in range of motion, strength, endurance as well as avoiding reinjury. Patient's frequency would include in 1-2 times a day, 3-5 times a week for a duration of 6-12 weeks. Hip strengthening exercises which included:  Pelvic tilt/bracing to help with proper recruitment of the lower abs and pelvic floor muscles  Glute strengthening to properly contract glutes without over-engaging low back and hamstrings - prone hip extension and glute bridge exercises Proper stretching techniques to increase effectiveness for the hip flexors, groin, quads, piriformic and low back when appropriate    Proper technique shown and discussed handout in great detail with ATC.  All questions were discussed and answered.      Impression and Recommendations:     This case required medical decision making of moderate complexity. The above documentation has been reviewed and is accurate and complete Jacqualin Combes       Note: This dictation was prepared with Dragon dictation along with smaller phrase technology. Any transcriptional errors that result from this process are unintentional.

## 2018-08-09 NOTE — Telephone Encounter (Signed)
Do we still need prior auth forms?  My recollection is that the forms required [place] to state when and by whom Aimovig was started.

## 2018-08-09 NOTE — Assessment & Plan Note (Signed)
Piriformis Syndrome  Using an anatomical model, reviewed with the patient the structures involved and how they related to diagnosis. The patient indicated understanding.   The patient was given a handout from Dr. Rouzier's book "The Sports Medicine Patient Advisor" describing the anatomy and rehabilitation of the following condition: Piriformis Syndrome  Also given a handout with more extensive Piriformis stretching, hip flexor and abductor strengthening, ham stretching  Rec deep massage, explained self-massage with ball RTC in 4-6 weeks 

## 2018-08-18 ENCOUNTER — Other Ambulatory Visit: Payer: Self-pay | Admitting: Family Medicine

## 2018-08-18 ENCOUNTER — Encounter: Payer: Self-pay | Admitting: Family Medicine

## 2018-08-18 ENCOUNTER — Ambulatory Visit (INDEPENDENT_AMBULATORY_CARE_PROVIDER_SITE_OTHER): Payer: BLUE CROSS/BLUE SHIELD | Admitting: Family Medicine

## 2018-08-18 ENCOUNTER — Other Ambulatory Visit: Payer: Self-pay

## 2018-08-18 VITALS — BP 118/70 | HR 85 | Temp 98.4°F | Ht 64.5 in | Wt 152.6 lb

## 2018-08-18 DIAGNOSIS — J310 Chronic rhinitis: Secondary | ICD-10-CM | POA: Diagnosis not present

## 2018-08-18 DIAGNOSIS — R5383 Other fatigue: Secondary | ICD-10-CM | POA: Diagnosis not present

## 2018-08-18 DIAGNOSIS — G43909 Migraine, unspecified, not intractable, without status migrainosus: Secondary | ICD-10-CM

## 2018-08-18 LAB — CBC WITH DIFFERENTIAL/PLATELET
BASOS PCT: 0.9 % (ref 0.0–3.0)
Basophils Absolute: 0.1 10*3/uL (ref 0.0–0.1)
Eosinophils Absolute: 0.1 10*3/uL (ref 0.0–0.7)
Eosinophils Relative: 1.7 % (ref 0.0–5.0)
HCT: 41.9 % (ref 36.0–46.0)
Hemoglobin: 13.8 g/dL (ref 12.0–15.0)
Lymphocytes Relative: 26.3 % (ref 12.0–46.0)
Lymphs Abs: 1.6 10*3/uL (ref 0.7–4.0)
MCHC: 32.9 g/dL (ref 30.0–36.0)
MCV: 88.3 fl (ref 78.0–100.0)
Monocytes Absolute: 0.7 10*3/uL (ref 0.1–1.0)
Monocytes Relative: 10.7 % (ref 3.0–12.0)
Neutro Abs: 3.7 10*3/uL (ref 1.4–7.7)
Neutrophils Relative %: 60.4 % (ref 43.0–77.0)
Platelets: 352 10*3/uL (ref 150.0–400.0)
RBC: 4.74 Mil/uL (ref 3.87–5.11)
RDW: 12.9 % (ref 11.5–15.5)
WBC: 6.1 10*3/uL (ref 4.0–10.5)

## 2018-08-18 LAB — VITAMIN B12: Vitamin B-12: 305 pg/mL (ref 211–911)

## 2018-08-18 MED ORDER — ERENUMAB-AOOE 70 MG/ML ~~LOC~~ SOAJ: 70.0000 mg | SUBCUTANEOUS | 11 refills | Status: DC

## 2018-08-18 NOTE — Progress Notes (Signed)
Subjective:     Patient ID: Gabriela Shepard, female   DOB: 05/15/1961, 58 y.o.   MRN: [id number]  HPI Patient is seen with nonspecific symptoms of fatigue, "swollen lymph nodes ", and some recurrent/chronic sinus symptoms.  She has had increased stress of caring for her 27 year old mother.  She has long history of allergic rhinitis.  Currently takes Singulair and Flonase.  She has had some intolerance with other antihistamines in the past.  Frequent daily nasal congestive symptoms.  No fever.  Increased fatigue.  Specifically requesting B12 level.  She does have chronic PPI use with Nexium.  Frequent migraine headaches.  She has had great results with Aimovig but had some difficulties getting this filled.  Is requesting new prescription.  This has done a great job helping control her migraine headaches.  Past Medical History:  Diagnosis Date  . Arthritis   . Bowel habit changes    been going on couple years  . Depression   . Flatulence    excessive with strong odor/uncontrollable  . Headache   . 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 History of UTI   . Migraines   . PONV (postoperative nausea and vomiting)   . PTSD (post-traumatic stress disorder)   . STD (sexually transmitted disease)   . Substance abuse ([place])   . Urine incontinence    Past Surgical History:  Procedure Laterality Date  . BREAST BIOPSY Left    2017 benign x 2  . CESAREAN SECTION  1992  . COLONOSCOPY    . INCONTINENCE SURGERY    . LAPAROSCOPY     under bladder  . NECK SURGERY  02/24/2018   ACDF  . TOTAL HIP ARTHROPLASTY     left  . TOTAL KNEE ARTHROPLASTY Left 12/23/2015   Procedure: TOTAL KNEE ARTHROPLASTY;  Surgeon: Melrose Nakayama, MD;  Location: [facility];  Service: Orthopedics;  Laterality: Left;    reports that she quit smoking about 7 years ago. Her smoking use included cigarettes. She has a 5.00 pack-year smoking history. She has never used smokeless tobacco. She reports that she  does not drink alcohol or use drugs. family history includes Alcohol abuse in an other family member; Arthritis in an other family member; Drug abuse in her sister; Hypertension in her maternal grandmother, mother, and another family member; Kidney cancer in her father; Lung cancer in her mother; Mental illness in an other family member; Stomach cancer in her maternal uncle; Ulcers in her father. Allergies  Allergen Reactions  . Sulfa Antibiotics Hives     Review of Systems  Constitutional: Positive for fatigue. Negative for appetite change, fever and unexpected weight change.  HENT: Positive for congestion.   Respiratory: Negative for cough and shortness of breath.   Cardiovascular: Negative for chest pain.  Gastrointestinal: Negative for abdominal pain.  Neurological: Positive for headaches.       Objective:   Physical Exam Constitutional:      Appearance: Normal appearance.  HENT:     Right Ear: Tympanic membrane normal.     Left Ear: Tympanic membrane normal.     Mouth/Throat:     Pharynx: Oropharynx is clear. No oropharyngeal exudate.  Neck:     Musculoskeletal: Neck supple.  Cardiovascular:     Rate and Rhythm: Normal rate and regular rhythm.  Pulmonary:     Effort: Pulmonary effort is normal.     Breath sounds: Normal breath sounds.  Neurological:  Mental Status: She is alert.        Assessment:     #1 chronic rhinitis symptoms.  Question allergic.  #2 increased fatigue  #3 history of frequent migraine headaches    Plan:     -Check labs with CBC and B12 level -Refilled Aimovig for 1 year -Recommend she try over-the-counter antihistamine such as levo-cetirizine and she can take this in addition to her Singulair and Flonase. -Set up CPE  Eulas Post MD [facility]

## 2018-08-18 NOTE — Telephone Encounter (Signed)
Patient was in the office today and stated that she is on the patient assistance program and she just needs the prescription to be sent in to her pharmacy. Prescription was sent today.

## 2018-08-18 NOTE — Patient Instructions (Signed)
Consider OTC XYZAL (levocetirizine).

## 2018-08-19 ENCOUNTER — Other Ambulatory Visit: Payer: Self-pay | Admitting: Family Medicine

## 2018-08-21 ENCOUNTER — Encounter: Payer: Self-pay | Admitting: Family Medicine

## 2018-08-21 ENCOUNTER — Telehealth: Payer: Self-pay

## 2018-08-21 NOTE — Telephone Encounter (Signed)
Pt returning call. States she has tried all recommendations, including Ceterizine, from Dr. Elease Hashimoto and continues to have "All the same symptoms."  States will CB to schedule CPE "Once I have all this under control." Requesting antibiotic refill as previously noted.

## 2018-08-21 NOTE — Telephone Encounter (Signed)
-----   Message from Eulas Post, MD sent at 08/20/2018 10:09 AM EDT ----- CBC and B12 normal.  Recommend setting up CPE soon.

## 2018-08-21 NOTE — Telephone Encounter (Signed)
Author phoned pt.to relay lab results, offer to schedule CPE, and follow-up on Dr. Erick Blinks recommendation to do OTC ceterizine per OV note from 3/13. No answer. Author left detailed VM asking for return call. OK for PEC to relay results, schedule CPE, and inquire as to whether or not pt. Has started ceterizine. Pt. had requested amoxicillin refill today via mychart, but will need to be reviewed by PCP once he knows if she has tried his recommendations.

## 2018-08-22 ENCOUNTER — Encounter: Payer: Self-pay | Admitting: Family Medicine

## 2018-08-23 ENCOUNTER — Other Ambulatory Visit: Payer: Self-pay

## 2018-08-23 MED ORDER — AMOXICILLIN-POT CLAVULANATE 875-125 MG PO TABS: 1.0000 | ORAL_TABLET | Freq: Two times a day (BID) | ORAL | 0 refills | Status: AC

## 2018-08-23 NOTE — Telephone Encounter (Signed)
Called patient and LMOVM to return call  [place] for [facility] to Discuss results / PCP / recommendations / Schedule patient  Left a message to let the patient know that her antibiotic has [facility] sent to the [facility] for her.

## 2018-08-23 NOTE — Telephone Encounter (Signed)
Rx done. 

## 2018-08-23 NOTE — Telephone Encounter (Signed)
Start Augmentin 875 mg po bid for 10 days.

## 2018-08-23 NOTE — Telephone Encounter (Signed)
Refills OK. 

## 2018-09-13 ENCOUNTER — Telehealth: Payer: Self-pay

## 2018-09-13 ENCOUNTER — Encounter: Payer: Self-pay | Admitting: Family Medicine

## 2018-09-13 ENCOUNTER — Other Ambulatory Visit: Payer: Self-pay

## 2018-09-13 ENCOUNTER — Ambulatory Visit (INDEPENDENT_AMBULATORY_CARE_PROVIDER_SITE_OTHER): Payer: BLUE CROSS/BLUE SHIELD | Admitting: Family Medicine

## 2018-09-13 DIAGNOSIS — R197 Diarrhea, unspecified: Secondary | ICD-10-CM

## 2018-09-13 DIAGNOSIS — E871 Hypo-osmolality and hyponatremia: Secondary | ICD-10-CM | POA: Diagnosis not present

## 2018-09-13 DIAGNOSIS — R238 Other skin changes: Secondary | ICD-10-CM | POA: Diagnosis not present

## 2018-09-13 DIAGNOSIS — R233 Spontaneous ecchymoses: Secondary | ICD-10-CM

## 2018-09-13 NOTE — Telephone Encounter (Signed)
Called patient in regards to her MyChart message and left a detailed voice message to set up a Doxy appointment with Dr. Elease Hashimoto.   OK for PEC to discuss/advise/schedule patient when she calls back.  CRM Created.

## 2018-09-13 NOTE — Progress Notes (Signed)
Patient ID: Gabriela Shepard, female   DOB: 1960/11/24, 58 y.o.   MRN: [id number]  Virtual Visit via Video Note  I connected with Merri Ray on 09/13/18 at  3:30 PM EDT by a video enabled telemedicine application and verified that I am speaking with the correct person using two identifiers.  Location patient: home Location provider:work or home office Persons participating in the virtual visit: patient, provider  I discussed the limitations of evaluation and management by telemedicine and the availability of in person appointments. The patient expressed understanding and agreed to proceed.   HPI: Patient had called in earlier today with several issues as follows  Recent easy bruising on both forearms and arms.  She has several visible bruises on video monitor today.  These were nontraumatic.  She has had some chronic easy bleeding from her gums but denies any other sources of bleeding.  She does have a couple of bruises on her lower legs.  She had CBC back in March which was unremarkable.  No aspirin use.  No anticoagulant use.  No known family history of coagulation disorders  She has had 5 to 6 days of some nonbloody diarrhea and diffuse abdominal cramps.  Intermittent lightheadedness.  She thinks the diarrhea may be stress related.  She does relate that her mother is had similar symptoms and she has been caring for her some.  Patient also has concerns because of some chronic hyponatremia.  Last sodium level 131.  She is on Effexor and rarely takes trazodone and both of these can affect the sodium levels.  She does not restrict sodium intake.   ROS: See pertinent positives and negatives per HPI.  Past Medical History:  Diagnosis Date  . Arthritis   . Bowel habit changes    been going on couple years  . Depression   . Flatulence    excessive with strong odor/uncontrollable  . Headache   . 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 History of UTI   . Migraines    . PONV (postoperative nausea and vomiting)   . PTSD (post-traumatic stress disorder)   . STD (sexually transmitted disease)   . Substance abuse ([place])   . Urine incontinence     Past Surgical History:  Procedure Laterality Date  . BREAST BIOPSY Left    2017 benign x 2  . CESAREAN SECTION  1992  . COLONOSCOPY    . INCONTINENCE SURGERY    . LAPAROSCOPY     under bladder  . NECK SURGERY  02/24/2018   ACDF  . TOTAL HIP ARTHROPLASTY     left  . TOTAL KNEE ARTHROPLASTY Left 12/23/2015   Procedure: TOTAL KNEE ARTHROPLASTY;  Surgeon: Melrose Nakayama, MD;  Location: [facility];  Service: Orthopedics;  Laterality: Left;    Family History  Problem Relation Age of Onset  . Hypertension Mother   . Lung cancer Mother   . Alcohol abuse Other   . Arthritis Other   . Hypertension Other   . Mental illness Other   . Kidney cancer Father   . Ulcers Father   . Drug abuse Sister   . Hypertension Maternal Grandmother   . Stomach cancer Maternal Uncle   . Colon cancer Neg Hx   . Esophageal cancer Neg Hx     SOCIAL HX: Non-smoker.  Past history of alcohol abuse.  Currently abstinent   Current Outpatient Medications:  .  Calcium Carbonate-Vit D-Min (CALCIUM 1200 PO),  Take 2 tablets by mouth daily as needed. , Disp: , Rfl:  .  clonazePAM (KLONOPIN) 1 MG tablet, TAKE 1 TABLET BY MOUTH AT BEDTIME, Disp: 90 tablet, Rfl: 1 .  Erenumab-aooe (AIMOVIG) 70 MG/ML SOAJ, Inject 70 mg into the skin every 30 (thirty) days., Disp: 1 pen, Rfl: 11 .  esomeprazole (NEXIUM) 40 MG capsule, TAKE 1 CAPSULE BY MOUTH EVERY DAY, Disp: 90 capsule, Rfl: 1 .  Estradiol 10 MCG TABS vaginal tablet, Use one tablet qhs x 1 week, then 2 x a week at hs, Disp: 24 tablet, Rfl: 3 .  fluticasone (FLONASE) 50 MCG/ACT nasal spray, Place 2 sprays daily into both nostrils., Disp: 16 g, Rfl: 11 .  gabapentin (NEURONTIN) 100 MG capsule, TAKE 2 CAPSULES (200 MG TOTAL) BY MOUTH AT BEDTIME., Disp: 180 capsule, Rfl: 1 .  hydrOXYzine  (ATARAX/VISTARIL) 10 MG tablet, Take 1 tablet (10 mg total) by mouth 3 (three) times daily as needed for anxiety., Disp: 90 tablet, Rfl: 1 .  hyoscyamine (LEVBID) 0.375 MG 12 hr tablet, TAKE 1 TABLET BY MOUTH TWICE A DAY (Patient taking differently: Take 0.375 mg by mouth 2 (two) times daily. ), Disp: 60 tablet, Rfl: 0 .  levothyroxine (SYNTHROID, LEVOTHROID) 25 MCG tablet, TAKE 1 TABLET BY MOUTH DAILY BEFORE BREAKFAST 03/02/18, Disp: 90 tablet, Rfl: 0 .  lidocaine (XYLOCAINE) 5 % ointment, APPLY 1 APPLICATION TOPICALLY 4 TIMES DAILY AS NEEDED, Disp: 35.44 g, Rfl: 0 .  MAGNESIUM PO, Take 2 tablets by mouth daily., Disp: , Rfl:  .  methocarbamol (ROBAXIN) 500 MG tablet, Take 500 mg by mouth 4 (four) times daily as needed for pain., Disp: , Rfl: 0 .  montelukast (SINGULAIR) 10 MG tablet, Take 1 tablet (10 mg total) by mouth at bedtime., Disp: 90 tablet, Rfl: 3 .  promethazine (PHENERGAN) 25 MG tablet, TAKE 1 TABLET BY MOUTH EVERY 6 HOURS AS NEEDED FOR NAUSEA AND VOMITING, Disp: 12 tablet, Rfl: 0 .  SUMAtriptan (IMITREX) 100 MG tablet, May repeat in 2 hours if headache persists or recurs., Disp: 9 tablet, Rfl: 4 .  traZODone (DESYREL) 100 MG tablet, Take 1 tablet (100 mg total) by mouth at bedtime as needed for sleep., Disp: 90 tablet, Rfl: 1 .  valACYclovir (VALTREX) 1000 MG tablet, Take 1,000 mg by mouth as needed (outbreak). , Disp: , Rfl: 2 .  valACYclovir (VALTREX) 500 MG tablet, TAKE 2 TABLETS FOR 3 DAYS AND THEN 1 TABLET DAILY AS NEEDED, Disp: 90 tablet, Rfl: 2 .  venlafaxine XR (EFFEXOR-XR) 150 MG 24 hr capsule, Take 1 capsule (150 mg total) by mouth daily with breakfast., Disp: 90 capsule, Rfl: 3 .  Vitamin D, Ergocalciferol, (DRISDOL) 1.25 MG (50000 UT) CAPS capsule, TAKE 1 CAPSULE (50,000 UNITS TOTAL) BY MOUTH EVERY 7 (SEVEN) DAYS., Disp: 12 capsule, Rfl: 0  EXAM:  VITALS per patient if applicable:  GENERAL: alert, oriented, appears well and in no acute distress  HEENT: atraumatic,  conjunttiva clear, no obvious abnormalities on inspection of external nose and ears  NECK: normal movements of the head and neck  LUNGS: on inspection no signs of respiratory distress, breathing rate appears normal, no obvious gross SOB, gasping or wheezing  CV: no obvious cyanosis  MS: moves all visible extremities without noticeable abnormality  PSYCH/NEURO: pleasant and cooperative, no obvious depression or anxiety, speech and thought processing grossly intact  ASSESSMENT AND PLAN:  Discussed the following assessment and plan:  #1 easy bruising. -Patient return tomorrow for further labs including PT,  PTT, repeat CBC  #2 history of chronic hyponatremia.  She states her mom has had similar issues.  She is on Effexor which can have some effect on sodium -Recheck basic metabolic panel  #3 diarrhea.-Question viral illness.  She does not have any associated vomiting.  Does not have any red flags such as fever, recent travel, recent antibiotic use, or any bloody stools Bland diet.   -She will focus on increased hydration and will take some Levbid which she has used in the past for abdominal cramping.  Follow-up for any persistent or worsening symptoms     I discussed the assessment and treatment plan with the patient. The patient was provided an opportunity to ask questions and all were answered. The patient agreed with the plan and demonstrated an understanding of the instructions.   The patient was advised to call back or seek an in-person evaluation if the symptoms worsen or if the condition fails to improve as anticipated.  Carolann Littler, MD

## 2018-09-14 ENCOUNTER — Other Ambulatory Visit: Payer: Self-pay

## 2018-09-14 ENCOUNTER — Other Ambulatory Visit (INDEPENDENT_AMBULATORY_CARE_PROVIDER_SITE_OTHER): Payer: BLUE CROSS/BLUE SHIELD

## 2018-09-14 DIAGNOSIS — E871 Hypo-osmolality and hyponatremia: Secondary | ICD-10-CM | POA: Diagnosis not present

## 2018-09-14 DIAGNOSIS — R238 Other skin changes: Secondary | ICD-10-CM

## 2018-09-14 DIAGNOSIS — R233 Spontaneous ecchymoses: Secondary | ICD-10-CM

## 2018-09-14 LAB — PROTIME-INR
INR: 0.9 ratio (ref 0.8–1.0)
Prothrombin Time: 10.8 s (ref 9.6–13.1)

## 2018-09-14 LAB — CBC WITH DIFFERENTIAL/PLATELET
Basophils Absolute: 0.1 10*3/uL (ref 0.0–0.1)
Basophils Relative: 1 % (ref 0.0–3.0)
Eosinophils Absolute: 0.2 10*3/uL (ref 0.0–0.7)
Eosinophils Relative: 3.1 % (ref 0.0–5.0)
HCT: 40.2 % (ref 36.0–46.0)
Hemoglobin: 13.5 g/dL (ref 12.0–15.0)
Lymphocytes Relative: 31.8 % (ref 12.0–46.0)
Lymphs Abs: 1.7 10*3/uL (ref 0.7–4.0)
MCHC: 33.7 g/dL (ref 30.0–36.0)
MCV: 87.8 fl (ref 78.0–100.0)
Monocytes Absolute: 0.6 10*3/uL (ref 0.1–1.0)
Monocytes Relative: 11.4 % (ref 3.0–12.0)
Neutro Abs: 2.7 10*3/uL (ref 1.4–7.7)
Neutrophils Relative %: 52.7 % (ref 43.0–77.0)
Platelets: 344 10*3/uL (ref 150.0–400.0)
RBC: 4.57 Mil/uL (ref 3.87–5.11)
RDW: 13.2 % (ref 11.5–15.5)
WBC: 5.2 10*3/uL (ref 4.0–10.5)

## 2018-09-14 LAB — BASIC METABOLIC PANEL
BUN: 14 mg/dL (ref 6–23)
CO2: 26 mEq/L (ref 19–32)
Calcium: 9 mg/dL (ref 8.4–10.5)
Chloride: 101 mEq/L (ref 96–112)
Creatinine, Ser: 0.78 mg/dL (ref 0.40–1.20)
GFR: 75.92 mL/min (ref >60.00)
Glucose, Bld: 88 mg/dL (ref 70–99)
Potassium: 4.7 mEq/L (ref 3.5–5.1)
Sodium: 135 mEq/L (ref 135–145)

## 2018-09-14 LAB — APTT: aPTT: 30.7 s (ref 23.4–32.7)

## 2018-09-21 ENCOUNTER — Ambulatory Visit: Payer: BLUE CROSS/BLUE SHIELD | Admitting: Family Medicine

## 2018-09-27 ENCOUNTER — Other Ambulatory Visit: Payer: Self-pay | Admitting: Family Medicine

## 2018-09-28 NOTE — Telephone Encounter (Signed)
Last OV 09/13/18, No future OV  Last filled 03/06/18, # 90 with 1 refill

## 2018-10-06 ENCOUNTER — Other Ambulatory Visit: Payer: Self-pay | Admitting: Family Medicine

## 2018-10-19 ENCOUNTER — Ambulatory Visit (INDEPENDENT_AMBULATORY_CARE_PROVIDER_SITE_OTHER): Payer: BLUE CROSS/BLUE SHIELD | Admitting: Family Medicine

## 2018-10-19 ENCOUNTER — Other Ambulatory Visit: Payer: Self-pay

## 2018-10-19 DIAGNOSIS — R059 Cough, unspecified: Secondary | ICD-10-CM

## 2018-10-19 DIAGNOSIS — R05 Cough: Secondary | ICD-10-CM

## 2018-10-19 MED ORDER — AZITHROMYCIN 250 MG PO TABS: ORAL_TABLET | ORAL | 0 refills | Status: AC

## 2018-10-19 NOTE — Progress Notes (Signed)
Patient ID: Gabriela Shepard, female   DOB: [birth year], 58 y.o.   MRN: [id number]  This visit type was conducted due to national recommendations for restrictions regarding the COVID-19 pandemic in an effort to limit this patient's exposure and mitigate transmission in our community.   Virtual Visit via Video Note  I connected with Gabriela Shepard on 10/19/18 at 10:00 AM EDT by a video enabled telemedicine application and verified that I am speaking with the correct person using two identifiers.  Location patient: home Location provider:work or home office Persons participating in the virtual visit: patient, provider  I discussed the limitations of evaluation and management by telemedicine and the availability of in person appointments. The patient expressed understanding and agreed to proceed.   HPI: Patient has had 3 weeks of cough.  She now has some mild dyspnea.  She has had some intermittent chills past couple days.  No documented fever.  Has had some increased headaches.  Some sinus congestion.  She initially thought this was more allergic.  She has been trying to work some.  She has had increased fatigue and increased body aches.  No known sick exposures.  She has some Tessalon at home but has not tried this yet.  She states she had atypical pneumonia several years ago and this felt very similar   ROS: See pertinent positives and negatives per HPI.  Past Medical History:  Diagnosis Date  . Arthritis   . Bowel habit changes    been going on couple years  . Depression   . Flatulence    excessive with strong odor/uncontrollable  . Headache   . 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 History of UTI   . Migraines   . PONV (postoperative nausea and vomiting)   . PTSD (post-traumatic stress disorder)   . STD (sexually transmitted disease)   . Substance abuse ([place])   . Urine incontinence     Past Surgical History:  Procedure Laterality Date  . BREAST BIOPSY  Left    2017 benign x 2  . CESAREAN SECTION  1992  . COLONOSCOPY    . INCONTINENCE SURGERY    . LAPAROSCOPY     under bladder  . NECK SURGERY  02/24/2018   ACDF  . TOTAL HIP ARTHROPLASTY     left  . TOTAL KNEE ARTHROPLASTY Left 12/23/2015   Procedure: TOTAL KNEE ARTHROPLASTY;  Surgeon: Melrose Nakayama, MD;  Location: [facility];  Service: Orthopedics;  Laterality: Left;    Family History  Problem Relation Age of Onset  . Hypertension Mother   . Lung cancer Mother   . Alcohol abuse Other   . Arthritis Other   . Hypertension Other   . Mental illness Other   . Kidney cancer Father   . Ulcers Father   . Drug abuse Sister   . Hypertension Maternal Grandmother   . Stomach cancer Maternal Uncle   . Colon cancer Neg Hx   . Esophageal cancer Neg Hx     SOCIAL HX: Non-smoker   Current Outpatient Medications:  .  azithromycin (ZITHROMAX Z-PAK) 250 MG tablet, Take 2 tablets (500 mg) on  Day 1,  followed by 1 tablet (250 mg) once daily on Days 2 through 5., Disp: 6 each, Rfl: 0 .  Calcium Carbonate-Vit D-Min (CALCIUM 1200 PO), Take 2 tablets by mouth daily as needed. , Disp: , Rfl:  .  clonazePAM (KLONOPIN) 1 MG tablet, TAKE 1 TABLET  BY MOUTH AT BEDTIME EVERY DAY, Disp: 90 tablet, Rfl: 0 .  Erenumab-aooe (AIMOVIG) 70 MG/ML SOAJ, Inject 70 mg into the skin every 30 (thirty) days., Disp: 1 pen, Rfl: 11 .  esomeprazole (NEXIUM) 40 MG capsule, TAKE 1 CAPSULE BY MOUTH EVERY DAY, Disp: 90 capsule, Rfl: 1 .  Estradiol 10 MCG TABS vaginal tablet, Use one tablet qhs x 1 week, then 2 x a week at hs, Disp: 24 tablet, Rfl: 3 .  fluticasone (FLONASE) 50 MCG/ACT nasal spray, Place 2 sprays daily into both nostrils., Disp: 16 g, Rfl: 11 .  gabapentin (NEURONTIN) 100 MG capsule, TAKE 2 CAPSULES (200 MG TOTAL) BY MOUTH AT BEDTIME., Disp: 180 capsule, Rfl: 1 .  hydrOXYzine (ATARAX/VISTARIL) 10 MG tablet, TAKE 1 TABLET (10 MG TOTAL) BY MOUTH 3 (THREE) TIMES DAILY AS NEEDED FOR ANXIETY., Disp: 90 tablet, Rfl:  1 .  hyoscyamine (LEVBID) 0.375 MG 12 hr tablet, TAKE 1 TABLET BY MOUTH TWICE A DAY (Patient taking differently: Take 0.375 mg by mouth 2 (two) times daily. ), Disp: 60 tablet, Rfl: 0 .  levothyroxine (SYNTHROID) 25 MCG tablet, TAKE 1 TABLET BY MOUTH DAILY BEFORE BREAKFAST, Disp: 90 tablet, Rfl: 0 .  lidocaine (XYLOCAINE) 5 % ointment, APPLY 1 APPLICATION TOPICALLY 4 TIMES DAILY AS NEEDED, Disp: 35.44 g, Rfl: 0 .  MAGNESIUM PO, Take 2 tablets by mouth daily., Disp: , Rfl:  .  methocarbamol (ROBAXIN) 500 MG tablet, Take 500 mg by mouth 4 (four) times daily as needed for pain., Disp: , Rfl: 0 .  montelukast (SINGULAIR) 10 MG tablet, Take 1 tablet (10 mg total) by mouth at bedtime., Disp: 90 tablet, Rfl: 3 .  promethazine (PHENERGAN) 25 MG tablet, TAKE 1 TABLET BY MOUTH EVERY 6 HOURS AS NEEDED FOR NAUSEA AND VOMITING, Disp: 12 tablet, Rfl: 0 .  SUMAtriptan (IMITREX) 100 MG tablet, TAKE 1 TABLET BY MOUTH AS NEEDED FOR HEADACHE, IF HEADACHE PERSISTS OR RECURS REPEAT IN 2 HOURS, Disp: 9 tablet, Rfl: 4 .  traZODone (DESYREL) 100 MG tablet, Take 1 tablet (100 mg total) by mouth at bedtime as needed for sleep., Disp: 90 tablet, Rfl: 1 .  valACYclovir (VALTREX) 1000 MG tablet, Take 1,000 mg by mouth as needed (outbreak). , Disp: , Rfl: 2 .  valACYclovir (VALTREX) 500 MG tablet, TAKE 2 TABLETS FOR 3 DAYS AND THEN 1 TABLET DAILY AS NEEDED, Disp: 90 tablet, Rfl: 2 .  venlafaxine XR (EFFEXOR-XR) 150 MG 24 hr capsule, Take 1 capsule (150 mg total) by mouth daily with breakfast., Disp: 90 capsule, Rfl: 3 .  Vitamin D, Ergocalciferol, (DRISDOL) 1.25 MG (50000 UT) CAPS capsule, TAKE 1 CAPSULE (50,000 UNITS TOTAL) BY MOUTH EVERY 7 (SEVEN) DAYS., Disp: 12 capsule, Rfl: 0  EXAM:  VITALS per patient if applicable:  GENERAL: alert, oriented, appears well and in no acute distress.  Patient is nontoxic in appearance.  No evidence for respiratory distress  HEENT: atraumatic, conjunttiva clear, no obvious abnormalities on  inspection of external nose and ears  NECK: normal movements of the head and neck  LUNGS: on inspection no signs of respiratory distress, breathing rate appears normal, no obvious gross SOB, gasping or wheezing  CV: no obvious cyanosis  MS: moves all visible extremities without noticeable abnormality  PSYCH/NEURO: pleasant and cooperative, no obvious depression or anxiety, speech and thought processing grossly intact  ASSESSMENT AND PLAN:  Discussed the following assessment and plan:  Cough.  She has had 3 weeks of cough but is now developed worsening  symptoms with fatigue, achiness, chills and possibly some dyspnea.  -She was given information regarding coronavirus screening which she should consider. -Go ahead and start Tessalon 200 mg every 8 hours as needed for cough which she has from leftover prescription -Start Zithromax and treat for 5 days -Follow-up for further evaluation for any progressive dyspnea, documented fever, or other concerns     I discussed the assessment and treatment plan with the patient. The patient was provided an opportunity to ask questions and all were answered. The patient agreed with the plan and demonstrated an understanding of the instructions.   The patient was advised to call back or seek an in-person evaluation if the symptoms worsen or if the condition fails to improve as anticipated.   Carolann Littler, MD

## 2018-10-22 ENCOUNTER — Encounter: Payer: Self-pay | Admitting: Family Medicine

## 2018-10-23 ENCOUNTER — Other Ambulatory Visit: Payer: Self-pay

## 2018-10-23 MED ORDER — AMOXICILLIN-POT CLAVULANATE 875-125 MG PO TABS: 1.0000 | ORAL_TABLET | Freq: Two times a day (BID) | ORAL | 0 refills | Status: AC

## 2018-11-01 ENCOUNTER — Other Ambulatory Visit: Payer: Self-pay | Admitting: Family Medicine

## 2018-12-27 DIAGNOSIS — M545 Low back pain: Secondary | ICD-10-CM | POA: Diagnosis not present

## 2018-12-27 DIAGNOSIS — M25551 Pain in right hip: Secondary | ICD-10-CM | POA: Diagnosis not present

## 2018-12-27 DIAGNOSIS — M542 Cervicalgia: Secondary | ICD-10-CM | POA: Diagnosis not present

## 2019-01-01 ENCOUNTER — Other Ambulatory Visit: Payer: Self-pay | Admitting: Family Medicine

## 2019-01-07 ENCOUNTER — Encounter: Payer: Self-pay | Admitting: Family Medicine

## 2019-01-09 ENCOUNTER — Ambulatory Visit: Payer: BLUE CROSS/BLUE SHIELD | Admitting: Family Medicine

## 2019-01-09 NOTE — Progress Notes (Deleted)
[facility] [address] [place], [place] [address] Phone: [phone] Subjective:    I'm seeing this patient by the request  of:    CC:   [id number]  Gabriela Shepard is a 58 y.o. female coming in with complaint of ***  Onset-  Location Duration-  Character- Aggravating factors- Reliving factors-  Therapies tried-  Severity-     Past Medical History:  Diagnosis Date  . Arthritis   . Bowel habit changes    been going on couple years  . Depression   . Flatulence    excessive with strong odor/uncontrollable  . Headache   . 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 History of UTI   . Migraines   . PONV (postoperative nausea and vomiting)   . PTSD (post-traumatic stress disorder)   . STD (sexually transmitted disease)   . Substance abuse ([place])   . Urine incontinence    Past Surgical History:  Procedure Laterality Date  . BREAST BIOPSY Left    2017 benign x 2  . CESAREAN SECTION  1992  . COLONOSCOPY    . INCONTINENCE SURGERY    . LAPAROSCOPY     under bladder  . NECK SURGERY  02/24/2018   ACDF  . TOTAL HIP ARTHROPLASTY     left  . TOTAL KNEE ARTHROPLASTY Left 12/23/2015   Procedure: TOTAL KNEE ARTHROPLASTY;  Surgeon: Melrose Nakayama, MD;  Location: [facility];  Service: Orthopedics;  Laterality: Left;   Social History   Socioeconomic History  . Marital status: Divorced    Spouse name: Not on file  . Number of children: Not on file  . Years of education: Not on file  . Highest education level: Not on file  Occupational History  . Not on file  Social Needs  . Financial resource strain: Not on file  . Food insecurity    Worry: Not on file    Inability: Not on file  . Transportation needs    Medical: Not on file    Non-medical: Not on file  Tobacco Use  . Smoking status: Former Smoker    Packs/day: 0.50    Years: 10.00    Pack years: 5.00    Types: Cigarettes    Quit date: 11/13/2010    Years since  quitting: 8.1  . Smokeless tobacco: Never Used  Substance and Sexual Activity  . Alcohol use: No  . Drug use: No  . Sexual activity: Not Currently    Birth control/protection: Post-menopausal  Lifestyle  . Physical activity    Days per week: Not on file    Minutes per session: Not on file  . Stress: Not on file  Relationships  . Social Herbalist on phone: Not on file    Gets together: Not on file    Attends religious service: Not on file    Active member of club or organization: Not on file    Attends meetings of clubs or organizations: Not on file    Relationship status: Not on file  Other Topics Concern  . Not on file  Social History Narrative  . Not on file   Allergies  Allergen Reactions  . Sulfa Antibiotics Hives   Family History  Problem Relation Age of Onset  . Hypertension Mother   . Lung cancer Mother   . Alcohol abuse Other   . Arthritis Other   . Hypertension Other   .  Mental illness Other   . Kidney cancer Father   . Ulcers Father   . Drug abuse Sister   . Hypertension Maternal Grandmother   . Stomach cancer Maternal Uncle   . Colon cancer Neg Hx   . Esophageal cancer Neg Hx     Current Outpatient Medications (Endocrine & Metabolic):  .  levothyroxine (SYNTHROID) 25 MCG tablet, TAKE 1 TABLET BY MOUTH EVERY DAY BEFORE BREAKFAST   Current Outpatient Medications (Respiratory):  .  fluticasone (FLONASE) 50 MCG/ACT nasal spray, Place 2 sprays daily into both nostrils. .  montelukast (SINGULAIR) 10 MG tablet, Take 1 tablet (10 mg total) by mouth at bedtime. .  promethazine (PHENERGAN) 25 MG tablet, TAKE 1 TABLET BY MOUTH EVERY 6 HOURS AS NEEDED FOR NAUSEA AND VOMITING  Current Outpatient Medications (Analgesics):  Marland Kitchen  Erenumab-aooe (AIMOVIG) 70 MG/ML SOAJ, Inject 70 mg into the skin every 30 (thirty) days. .  SUMAtriptan (IMITREX) 100 MG tablet, TAKE 1 TABLET BY MOUTH AS NEEDED FOR HEADACHE, IF HEADACHE PERSISTS OR RECURS REPEAT IN 2 HOURS    Current Outpatient Medications (Other):  Marland Kitchen  Calcium Carbonate-Vit D-Min (CALCIUM 1200 PO), Take 2 tablets by mouth daily as needed.  .  clonazePAM (KLONOPIN) 1 MG tablet, TAKE 1 TABLET BY MOUTH AT BEDTIME EVERY DAY .  esomeprazole (NEXIUM) 40 MG capsule, TAKE 1 CAPSULE BY MOUTH EVERY DAY .  Estradiol 10 MCG TABS vaginal tablet, Use one tablet qhs x 1 week, then 2 x a week at hs .  gabapentin (NEURONTIN) 100 MG capsule, TAKE 2 CAPSULES (200 MG TOTAL) BY MOUTH AT BEDTIME. .  hydrOXYzine (ATARAX/VISTARIL) 10 MG tablet, TAKE 1 TABLET (10 MG TOTAL) BY MOUTH 3 (THREE) TIMES DAILY AS NEEDED FOR ANXIETY. .  hyoscyamine (LEVBID) 0.375 MG 12 hr tablet, TAKE 1 TABLET BY MOUTH TWICE A DAY (Patient taking differently: Take 0.375 mg by mouth 2 (two) times daily. ) .  lidocaine (XYLOCAINE) 5 % ointment, APPLY 1 APPLICATION TOPICALLY 4 TIMES DAILY AS NEEDED .  MAGNESIUM PO, Take 2 tablets by mouth daily. .  methocarbamol (ROBAXIN) 500 MG tablet, Take 500 mg by mouth 4 (four) times daily as needed for pain. .  traZODone (DESYREL) 100 MG tablet, Take 1 tablet (100 mg total) by mouth at bedtime as needed for sleep. .  valACYclovir (VALTREX) 1000 MG tablet, Take 1,000 mg by mouth as needed (outbreak).  .  valACYclovir (VALTREX) 500 MG tablet, TAKE 2 TABLETS FOR 3 DAYS AND THEN 1 TABLET DAILY AS NEEDED .  venlafaxine XR (EFFEXOR-XR) 150 MG 24 hr capsule, Take 1 capsule (150 mg total) by mouth daily with breakfast. .  Vitamin D, Ergocalciferol, (DRISDOL) 1.25 MG (50000 UT) CAPS capsule, TAKE 1 CAPSULE (50,000 UNITS TOTAL) BY MOUTH EVERY 7 (SEVEN) DAYS.    Past medical history, social, surgical and family history all reviewed in electronic medical record.  No pertanent information unless stated regarding to the chief complaint.   Review of Systems:  No headache, visual changes, nausea, vomiting, diarrhea, constipation, dizziness, abdominal pain, skin rash, fevers, chills, night sweats, weight loss, swollen lymph  nodes, body aches, joint swelling, muscle aches, chest pain, shortness of breath, mood changes.   Objective  There were no vitals taken for this visit. Systems examined below as of    General: No apparent distress alert and oriented x3 mood and affect normal, dressed appropriately.  HEENT: Pupils equal, extraocular movements intact  Respiratory: Patient's speak in full sentences and does not appear short  of breath  Cardiovascular: No lower extremity edema, non tender, no erythema  Skin: Warm dry intact with no signs of infection or rash on extremities or on axial skeleton.  Abdomen: Soft nontender  Neuro: Cranial nerves II through XII are intact, neurovascularly intact in all extremities with 2+ DTRs and 2+ pulses.  Lymph: No lymphadenopathy of posterior or anterior cervical chain or axillae bilaterally.  Gait normal with good balance and coordination.  MSK:  Non tender with full range of motion and good stability and symmetric strength and tone of shoulders, elbows, wrist, hip, knee and ankles bilaterally.     Impression and Recommendations:     This case required medical decision making of moderate complexity. The above documentation has been reviewed and is accurate and complete Lyndal Pulley, DO       Note: This dictation was prepared with Dragon dictation along with smaller phrase technology. Any transcriptional errors that result from this process are unintentional.

## 2019-01-10 ENCOUNTER — Encounter: Payer: Self-pay | Admitting: Family Medicine

## 2019-01-10 ENCOUNTER — Other Ambulatory Visit: Payer: Self-pay

## 2019-01-10 ENCOUNTER — Ambulatory Visit (INDEPENDENT_AMBULATORY_CARE_PROVIDER_SITE_OTHER): Payer: BC Managed Care – PPO | Admitting: Family Medicine

## 2019-01-10 ENCOUNTER — Ambulatory Visit: Payer: Self-pay

## 2019-01-10 VITALS — BP 106/74 | Ht 64.5 in | Wt 152.0 lb

## 2019-01-10 DIAGNOSIS — G5701 Lesion of sciatic nerve, right lower limb: Secondary | ICD-10-CM

## 2019-01-10 DIAGNOSIS — M5412 Radiculopathy, cervical region: Secondary | ICD-10-CM

## 2019-01-10 DIAGNOSIS — M25511 Pain in right shoulder: Secondary | ICD-10-CM | POA: Diagnosis not present

## 2019-01-10 DIAGNOSIS — M25551 Pain in right hip: Secondary | ICD-10-CM

## 2019-01-10 DIAGNOSIS — M50122 Cervical disc disorder at C5-C6 level with radiculopathy: Secondary | ICD-10-CM

## 2019-01-10 MED ORDER — ESOMEPRAZOLE MAGNESIUM 40 MG PO CPDR: DELAYED_RELEASE_CAPSULE | ORAL | 3 refills | Status: DC

## 2019-01-10 MED ORDER — PREDNISONE 50 MG PO TABS: 50.0000 mg | ORAL_TABLET | Freq: Every day | ORAL | 0 refills | Status: DC

## 2019-01-10 NOTE — Assessment & Plan Note (Signed)
Repeat injection given today and tolerated the procedure well.  Discussed icing regimen and home exercises, continue gabapentin.  For breakthrough pain restart prednisone for 5 days.  Discussed avoiding anti-inflammatories at this time.  Patient will follow-up with me otherwise in 6 weeks.  Differential includes more of a cervical radiculopathy again and we need to consider the possibility of another epidural.

## 2019-01-10 NOTE — Patient Instructions (Signed)
Good to see you  Tried trigger point injecitons, also did a pirformis injeciton as well.  If not better by the weekend take the prednison edaily for 5 days but try to social isolate the best you can during that  Ice 20 minutes 2 times daily. Usually after activity and before bed. Let everything cool down and start the exercises again  Starting in 10 days  If not better in 2 weeks write me and we will consider epidural in the neck again  See me again in 6 weeks otherwise

## 2019-01-10 NOTE — Assessment & Plan Note (Signed)
Given today.  Discussed icing regimen and home exercise.  Discussed hip abductor strengthening.  Differential includes a lumbar radiculopathy.Patient be a candidate for possible physical therapy.  Follow-up again in 4 to 8 weeks

## 2019-01-10 NOTE — Progress Notes (Signed)
[name] [place] [place], [place] [address] Phone: [phone] Subjective:       CC: Neck pain  INO:[id number]  Gabriela Shepard is a 58 y.o. female coming in with complaint of neck pain.  Patient has had this for some time.  Rates the severity of pain 8 out of 10.  Patient has responded to trigger points previously.  Also having more piriformis pain.  Has noticed that seems to stay more in the buttocks area but can have some radicular symptoms.  Waking her up at night.   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  Has responded to trigger point since then  Past Medical History:  Diagnosis Date  . Arthritis   . Bowel habit changes    been going on couple years  . Depression   . Flatulence    excessive with strong odor/uncontrollable  . Headache   . 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 History of UTI   . Migraines   . PONV (postoperative nausea and vomiting)   . PTSD (post-traumatic stress disorder)   . STD (sexually transmitted disease)   . Substance abuse ([place])   . Urine incontinence    Past Surgical History:  Procedure Laterality Date  . BREAST BIOPSY Left    2017 benign x 2  . CESAREAN SECTION  1992  . COLONOSCOPY    . INCONTINENCE SURGERY    . LAPAROSCOPY     under bladder  . NECK SURGERY  02/24/2018   ACDF  . TOTAL HIP ARTHROPLASTY     left  . TOTAL KNEE ARTHROPLASTY Left 12/23/2015   Procedure: TOTAL KNEE ARTHROPLASTY;  Surgeon: Melrose Nakayama, MD;  Location: [facility];  Service: Orthopedics;  Laterality: Left;   Social History   Socioeconomic History  . Marital status: Divorced    Spouse name: Not on file  . Number of children: Not on file  . Years of education: Not on file  . Highest education level: Not on file  Occupational History   . Not on file  Social Needs  . Financial resource strain: Not on file  . Food insecurity    Worry: Not on file    Inability: Not on file  . Transportation needs    Medical: Not on file    Non-medical: Not on file  Tobacco Use  . Smoking status: Former Smoker    Packs/day: 0.50    Years: 10.00    Pack years: 5.00    Types: Cigarettes    Quit date: 11/13/2010    Years since quitting: 8.1  . Smokeless tobacco: Never Used  Substance and Sexual Activity  . Alcohol use: No  . Drug use: No  . Sexual activity: Not Currently    Birth control/protection: Post-menopausal  Lifestyle  . Physical activity    Days per week: Not on file    Minutes per session: Not on file  . Stress: Not on file  Relationships  . Social Herbalist on phone: Not on file    Gets together: Not on file    Attends religious service: Not on file    Active member of club or organization: Not on file    Attends meetings of clubs or organizations: Not on file  Relationship status: Not on file  Other Topics Concern  . Not on file  Social History Narrative  . Not on file   Allergies  Allergen Reactions  . Sulfa Antibiotics Hives   Family History  Problem Relation Age of Onset  . Hypertension Mother   . Lung cancer Mother   . Alcohol abuse Other   . Arthritis Other   . Hypertension Other   . Mental illness Other   . Kidney cancer Father   . Ulcers Father   . Drug abuse Sister   . Hypertension Maternal Grandmother   . Stomach cancer Maternal Uncle   . Colon cancer Neg Hx   . Esophageal cancer Neg Hx     Current Outpatient Medications (Endocrine & Metabolic):  .  levothyroxine (SYNTHROID) 25 MCG tablet, TAKE 1 TABLET BY MOUTH EVERY DAY BEFORE BREAKFAST .  predniSONE (DELTASONE) 50 MG tablet, Take 1 tablet (50 mg total) by mouth daily.   Current Outpatient Medications (Respiratory):  .  fluticasone (FLONASE) 50 MCG/ACT nasal spray, Place 2 sprays daily into both nostrils. .   montelukast (SINGULAIR) 10 MG tablet, Take 1 tablet (10 mg total) by mouth at bedtime. .  promethazine (PHENERGAN) 25 MG tablet, TAKE 1 TABLET BY MOUTH EVERY 6 HOURS AS NEEDED FOR NAUSEA AND VOMITING  Current Outpatient Medications (Analgesics):  Marland Kitchen  Erenumab-aooe (AIMOVIG) 70 MG/ML SOAJ, Inject 70 mg into the skin every 30 (thirty) days. .  SUMAtriptan (IMITREX) 100 MG tablet, TAKE 1 TABLET BY MOUTH AS NEEDED FOR HEADACHE, IF HEADACHE PERSISTS OR RECURS REPEAT IN 2 HOURS   Current Outpatient Medications (Other):  Marland Kitchen  Calcium Carbonate-Vit D-Min (CALCIUM 1200 PO), Take 2 tablets by mouth daily as needed.  .  clonazePAM (KLONOPIN) 1 MG tablet, TAKE 1 TABLET BY MOUTH AT BEDTIME EVERY DAY .  esomeprazole (NEXIUM) 40 MG capsule, TAKE 1 CAPSULE BY MOUTH EVERY DAY .  Estradiol 10 MCG TABS vaginal tablet, Use one tablet qhs x 1 week, then 2 x a week at hs .  gabapentin (NEURONTIN) 100 MG capsule, TAKE 2 CAPSULES (200 MG TOTAL) BY MOUTH AT BEDTIME. .  hydrOXYzine (ATARAX/VISTARIL) 10 MG tablet, TAKE 1 TABLET (10 MG TOTAL) BY MOUTH 3 (THREE) TIMES DAILY AS NEEDED FOR ANXIETY. .  hyoscyamine (LEVBID) 0.375 MG 12 hr tablet, TAKE 1 TABLET BY MOUTH TWICE A DAY (Patient taking differently: Take 0.375 mg by mouth 2 (two) times daily. ) .  lidocaine (XYLOCAINE) 5 % ointment, APPLY 1 APPLICATION TOPICALLY 4 TIMES DAILY AS NEEDED .  MAGNESIUM PO, Take 2 tablets by mouth daily. .  methocarbamol (ROBAXIN) 500 MG tablet, Take 500 mg by mouth 4 (four) times daily as needed for pain. .  traZODone (DESYREL) 100 MG tablet, Take 1 tablet (100 mg total) by mouth at bedtime as needed for sleep. .  valACYclovir (VALTREX) 1000 MG tablet, Take 1,000 mg by mouth as needed (outbreak).  .  valACYclovir (VALTREX) 500 MG tablet, TAKE 2 TABLETS FOR 3 DAYS AND THEN 1 TABLET DAILY AS NEEDED .  venlafaxine XR (EFFEXOR-XR) 150 MG 24 hr capsule, Take 1 capsule (150 mg total) by mouth daily with breakfast. .  Vitamin D, Ergocalciferol,  (DRISDOL) 1.25 MG (50000 UT) CAPS capsule, TAKE 1 CAPSULE (50,000 UNITS TOTAL) BY MOUTH EVERY 7 (SEVEN) DAYS.    Past medical history, social, surgical and family history all reviewed in electronic medical record.  No pertanent information unless stated regarding to the chief complaint.   Review of Systems:  No headache, visual changes, nausea, vomiting, diarrhea, constipation, dizziness, abdominal pain, skin rash, fevers, chills, night sweats, weight loss, swollen lymph nodes, body aches, joint swelling,  chest pain, shortness of breath, mood changes.  Positive muscle aches Objective  Blood pressure 106/74, height 5' 4.5" (1.638 m), weight 152 lb (68.9 kg), SpO2 97 %.    General: No apparent distress alert and oriented x3 mood and affect normal, dressed appropriately.  HEENT: Pupils equal, extraocular movements intact  Respiratory: Patient's speak in full sentences and does not appear short of breath  Cardiovascular: No lower extremity edema, non tender, no erythema  Skin: Warm dry intact with no signs of infection or rash on extremities or on axial skeleton.  Abdomen: Soft nontender  Neuro: Cranial nerves II through XII are intact, neurovascularly intact in all extremities with 2+ DTRs and 2+ pulses.  Lymph: No lymphadenopathy of posterior or anterior cervical chain or axillae bilaterally.  Gait normal with good balance and coordination.  MSK:  tender with full range of motion and good stability and symmetric strength and tone of shoulders, elbows, wrist, hip, knee and ankles bilaterally.  Patient's neck does have some tightness noted in the paraspinal musculature.  Severe tightness in the right shoulder trapezius area with multiple trigger points.  Low back exam does have significant tightness of loss of lordosis.  Positive Faber on the right.  Negative straight leg test.  5-5 strength with deep tendon reflexes intact.  After verbal consent patient was prepped with alcohol swabs and with  a 25-gauge half inch needle injected and 4 distinct trigger points in the right shoulder region.  Total of 3 cc of 0.5% Marcaine and 1 cc of Kenalog 40 mg/mL used.  Procedure: Real-time Ultrasound Guided Injection of right piriformis tendon sheath  device: GE Logiq Q7 Ultrasound guided injection is preferred based studies that show increased duration, increased effect, greater accuracy, decreased procedural pain, increased response rate, and decreased cost with ultrasound guided versus blind injection.  Verbal informed consent obtained.  Time-out conducted.  Noted no overlying erythema, induration, or other signs of local infection.  Skin prepped in a sterile fashion.  Local anesthesia: Topical Ethyl chloride.  With sterile technique and under real time ultrasound guidance: With a 21-gauge 2 inch needle patient was injected into the right piriformis with a total of 1 cc of 0.5% Marcaine and 1 cc of Kenalog 40 mg/mL Completed without difficulty  Pain immediately resolved suggesting accurate placement of the medication.  Advised to call if fevers/chills, erythema, induration, drainage, or persistent bleeding.  Images permanently stored and available for review in the ultrasound unit.  Impression: Technically successful ultrasound guided injection.   Impression and Recommendations:     This case required medical decision making of moderate complexity. The above documentation has been reviewed and is accurate and complete Lyndal Pulley, DO       Note: This dictation was prepared with Dragon dictation along with smaller phrase technology. Any transcriptional errors that result from this process are unintentional.

## 2019-01-17 DIAGNOSIS — M542 Cervicalgia: Secondary | ICD-10-CM | POA: Diagnosis not present

## 2019-01-19 DIAGNOSIS — S134XXD Sprain of ligaments of cervical spine, subsequent encounter: Secondary | ICD-10-CM | POA: Diagnosis not present

## 2019-01-23 DIAGNOSIS — M542 Cervicalgia: Secondary | ICD-10-CM | POA: Diagnosis not present

## 2019-01-23 DIAGNOSIS — G992 Myelopathy in diseases classified elsewhere: Secondary | ICD-10-CM | POA: Diagnosis not present

## 2019-01-23 DIAGNOSIS — M5020 Other cervical disc displacement, unspecified cervical region: Secondary | ICD-10-CM | POA: Diagnosis not present

## 2019-01-23 DIAGNOSIS — Z6826 Body mass index (BMI) 26.0-26.9, adult: Secondary | ICD-10-CM | POA: Diagnosis not present

## 2019-01-24 DIAGNOSIS — Z713 Dietary counseling and surveillance: Secondary | ICD-10-CM | POA: Diagnosis not present

## 2019-01-26 DIAGNOSIS — L814 Other melanin hyperpigmentation: Secondary | ICD-10-CM | POA: Diagnosis not present

## 2019-01-26 DIAGNOSIS — H0019 Chalazion unspecified eye, unspecified eyelid: Secondary | ICD-10-CM | POA: Diagnosis not present

## 2019-01-29 DIAGNOSIS — H00022 Hordeolum internum right lower eyelid: Secondary | ICD-10-CM | POA: Diagnosis not present

## 2019-01-31 DIAGNOSIS — S134XXD Sprain of ligaments of cervical spine, subsequent encounter: Secondary | ICD-10-CM | POA: Diagnosis not present

## 2019-02-02 DIAGNOSIS — S134XXD Sprain of ligaments of cervical spine, subsequent encounter: Secondary | ICD-10-CM | POA: Diagnosis not present

## 2019-02-06 DIAGNOSIS — S134XXD Sprain of ligaments of cervical spine, subsequent encounter: Secondary | ICD-10-CM | POA: Diagnosis not present

## 2019-02-08 DIAGNOSIS — S134XXD Sprain of ligaments of cervical spine, subsequent encounter: Secondary | ICD-10-CM | POA: Diagnosis not present

## 2019-02-11 ENCOUNTER — Other Ambulatory Visit: Payer: Self-pay | Admitting: Family Medicine

## 2019-02-13 DIAGNOSIS — S134XXD Sprain of ligaments of cervical spine, subsequent encounter: Secondary | ICD-10-CM | POA: Diagnosis not present

## 2019-02-16 ENCOUNTER — Other Ambulatory Visit: Payer: Self-pay | Admitting: Family Medicine

## 2019-02-18 NOTE — Progress Notes (Signed)
[facility] [address] [place], [place] [address] Phone: [phone] Subjective:   I Gabriela Shepard am serving as a Education administrator for Dr. Hulan Saas.  I'm seeing this patient by the request  of:    CC: Neck and back pain  [id number]   01/10/2019 Given today.  Discussed icing regimen and home exercise.  Discussed hip abductor strengthening.  Differential includes a lumbar radiculopathy.Patient be a candidate for possible physical therapy.  Follow-up again in 4 to 8 weeks  02/20/2019 Gabriela Shepard is a 58 y.o. female coming in with complaint of back/ hip pain. Piriformis is still painful. Injected last visit that lasted about 2 week. Has tried sitting on a tennis ball but the relief does not last. Going to PT for her neck and back.  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  Piriformis is worsening again but not quite as bad as what it was previously.  Still can affect daily activities as well as wake her up at night.    Past Medical History:  Diagnosis Date  . Arthritis   . Bowel habit changes    been going on couple years  . Depression   . Flatulence    excessive with strong odor/uncontrollable  . Headache   . 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 History of UTI   . Migraines   . PONV (postoperative nausea and vomiting)   . PTSD (post-traumatic stress disorder)   . STD (sexually transmitted disease)   . Substance abuse ([place])   . Urine incontinence    Past Surgical History:  Procedure Laterality Date  . BREAST BIOPSY Left    2017 benign x 2  . CESAREAN SECTION  1992  . COLONOSCOPY    . INCONTINENCE SURGERY    . LAPAROSCOPY     under bladder  . NECK SURGERY  02/24/2018   ACDF  . TOTAL HIP ARTHROPLASTY     left  . TOTAL KNEE ARTHROPLASTY Left 12/23/2015   Procedure: TOTAL KNEE ARTHROPLASTY;  Surgeon: Melrose Nakayama, MD;  Location: [facility];  Service: Orthopedics;   Laterality: Left;   Social History   Socioeconomic History  . Marital status: Divorced    Spouse name: Not on file  . Number of children: Not on file  . Years of education: Not on file  . Highest education level: Not on file  Occupational History  . Not on file  Social Needs  . Financial resource strain: Not on file  . Food insecurity    Worry: Not on file    Inability: Not on file  . Transportation needs    Medical: Not on file    Non-medical: Not on file  Tobacco Use  . Smoking status: Former Smoker    Packs/day: 0.50    Years: 10.00    Pack years: 5.00    Types: Cigarettes    Quit date: 11/13/2010    Years since quitting: 8.2  . Smokeless tobacco: Never Used  Substance and Sexual Activity  . Alcohol use: No  . Drug use: No  . Sexual activity: Not Currently    Birth control/protection: Post-menopausal  Lifestyle  . Physical activity    Days per week: Not on file    Minutes per session: Not on file  . Stress: Not on file  Relationships  . Social Herbalist on  phone: Not on file    Gets together: Not on file    Attends religious service: Not on file    Active member of club or organization: Not on file    Attends meetings of clubs or organizations: Not on file    Relationship status: Not on file  Other Topics Concern  . Not on file  Social History Narrative  . Not on file   Allergies  Allergen Reactions  . Sulfa Antibiotics Hives   Family History  Problem Relation Age of Onset  . Hypertension Mother   . Lung cancer Mother   . Alcohol abuse Other   . Arthritis Other   . Hypertension Other   . Mental illness Other   . Kidney cancer Father   . Ulcers Father   . Drug abuse Sister   . Hypertension Maternal Grandmother   . Stomach cancer Maternal Uncle   . Colon cancer Neg Hx   . Esophageal cancer Neg Hx     Current Outpatient Medications (Endocrine & Metabolic):  .  levothyroxine (SYNTHROID) 25 MCG tablet, TAKE 1 TABLET BY MOUTH EVERY DAY  BEFORE BREAKFAST .  predniSONE (DELTASONE) 50 MG tablet, Take 1 tablet (50 mg total) by mouth daily.   Current Outpatient Medications (Respiratory):  .  fluticasone (FLONASE) 50 MCG/ACT nasal spray, Place 2 sprays daily into both nostrils. .  montelukast (SINGULAIR) 10 MG tablet, Take 1 tablet (10 mg total) by mouth at bedtime. .  promethazine (PHENERGAN) 25 MG tablet, TAKE 1 TABLET BY MOUTH EVERY 6 HOURS AS NEEDED FOR NAUSEA AND VOMITING  Current Outpatient Medications (Analgesics):  Marland Kitchen  Erenumab-aooe (AIMOVIG) 70 MG/ML SOAJ, Inject 70 mg into the skin every 30 (thirty) days. .  SUMAtriptan (IMITREX) 100 MG tablet, TAKE 1 TABLET BY MOUTH AS NEEDED FOR HEADACHE, IF HEADACHE PERSISTS OR RECURS REPEAT IN 2 HOURS   Current Outpatient Medications (Other):  Marland Kitchen  Calcium Carbonate-Vit D-Min (CALCIUM 1200 PO), Take 2 tablets by mouth daily as needed.  .  clonazePAM (KLONOPIN) 1 MG tablet, TAKE 1 TABLET BY MOUTH AT BEDTIME EVERY DAY .  esomeprazole (NEXIUM) 40 MG capsule, TAKE 1 CAPSULE BY MOUTH EVERY DAY .  Estradiol 10 MCG TABS vaginal tablet, Use one tablet qhs x 1 week, then 2 x a week at hs .  gabapentin (NEURONTIN) 100 MG capsule, TAKE 2 CAPSULES (200 MG TOTAL) BY MOUTH AT BEDTIME. .  hydrOXYzine (ATARAX/VISTARIL) 10 MG tablet, TAKE 1 TABLET (10 MG TOTAL) BY MOUTH 3 (THREE) TIMES DAILY AS NEEDED FOR ANXIETY. .  hyoscyamine (LEVBID) 0.375 MG 12 hr tablet, TAKE 1 TABLET BY MOUTH TWICE A DAY (Patient taking differently: Take 0.375 mg by mouth 2 (two) times daily. ) .  lidocaine (XYLOCAINE) 5 % ointment, APPLY 1 APPLICATION TOPICALLY 4 TIMES DAILY AS NEEDED .  MAGNESIUM PO, Take 2 tablets by mouth daily. .  methocarbamol (ROBAXIN) 500 MG tablet, Take 500 mg by mouth 4 (four) times daily as needed for pain. .  traZODone (DESYREL) 100 MG tablet, TAKE 1 TABLET BY MOUTH AT BEDTIME AS NEEDED FOR SLEEP .  valACYclovir (VALTREX) 1000 MG tablet, Take 1,000 mg by mouth as needed (outbreak).  .  valACYclovir  (VALTREX) 500 MG tablet, TAKE 2 TABLETS FOR 3 DAYS AND THEN 1 TABLET DAILY AS NEEDED .  venlafaxine XR (EFFEXOR-XR) 150 MG 24 hr capsule, Take 1 capsule (150 mg total) by mouth daily with breakfast. .  Vitamin D, Ergocalciferol, (DRISDOL) 1.25 MG (50000 UT) CAPS capsule,  TAKE 1 CAPSULE (50,000 UNITS TOTAL) BY MOUTH EVERY 7 (SEVEN) DAYS. Marland Kitchen  tiZANidine (ZANAFLEX) 4 MG capsule, 1 tablet at night    Past medical history, social, surgical and family history all reviewed in electronic medical record.  No pertanent information unless stated regarding to the chief complaint.   Review of Systems:  No headache, visual changes, nausea, vomiting, diarrhea, constipation, dizziness, abdominal pain, skin rash, fevers, chills, night sweats, weight loss, swollen lymph nodes, body aches, joint swelling, muscle aches, chest pain, shortness of breath, mood changes.   Objective  Blood pressure 130/80, pulse 76, height 5' 4.5" (1.638 m), weight 148 lb (67.1 kg), SpO2 98 %.     General: No apparent distress alert and oriented x3 mood and affect normal, dressed appropriately.  HEENT: Pupils equal, extraocular movements intact  Respiratory: Patient's speak in full sentences and does not appear short of breath  Cardiovascular: No lower extremity edema, non tender, no erythema  Skin: Warm dry intact with no signs of infection or rash on extremities or on axial skeleton.  Abdomen: Soft nontender  Neuro: Cranial nerves II through XII are intact, neurovascularly intact in all extremities with 2+ DTRs and 2+ pulses.  Lymph: No lymphadenopathy of posterior or anterior cervical chain or axillae bilaterally.  Gait mild antalgic MSK:  Non tender with full range of motion and good stability and symmetric strength and tone of shoulders, elbows, wrist, knee and ankles bilaterally.   Right hip exam shows the patient does have a positive Corky Sox.  Severe tenderness over the piriformis as well as the sacroiliac joint.  Mild pain  over the paraspinal muscles of the lumbar spine on the right side.  Negative straight leg test.  Procedure: Real-time Ultrasound Guided Injection of right sacroiliac joint Device: GE Logiq Q7 Ultrasound guided injection is preferred based studies that show increased duration, increased effect, greater accuracy, decreased procedural pain, increased response rate, and decreased cost with ultrasound guided versus blind injection.  Verbal informed consent obtained.  Time-out conducted.  Noted no overlying erythema, induration, or other signs of local infection.  Skin prepped in a sterile fashion.  Local anesthesia: Topical Ethyl chloride.  With sterile technique and under real time ultrasound guidance: With a 21-gauge 2 inch needle injected with 0.5 cc of 0.5% Marcaine and 0.5 cc of Kenalog 40 mg/mL into the right sacroiliac joint Completed without difficulty  Pain immediately resolved suggesting accurate placement of the medication.  Advised to call if fevers/chills, erythema, induration, drainage, or persistent bleeding.  Images permanently stored and available for review in the ultrasound unit.  Impression: Technically successful ultrasound guided injection.    Impression and Recommendations:     This case required medical decision making of moderate complexity. The above documentation has been reviewed and is accurate and complete Lyndal Pulley, DO       Note: This dictation was prepared with Dragon dictation along with smaller phrase technology. Any transcriptional errors that result from this process are unintentional.

## 2019-02-19 ENCOUNTER — Other Ambulatory Visit: Payer: Self-pay | Admitting: Family Medicine

## 2019-02-20 ENCOUNTER — Other Ambulatory Visit: Payer: Self-pay

## 2019-02-20 ENCOUNTER — Encounter: Payer: Self-pay | Admitting: Family Medicine

## 2019-02-20 ENCOUNTER — Ambulatory Visit (INDEPENDENT_AMBULATORY_CARE_PROVIDER_SITE_OTHER): Payer: BC Managed Care – PPO | Admitting: Family Medicine

## 2019-02-20 ENCOUNTER — Ambulatory Visit: Payer: Self-pay

## 2019-02-20 VITALS — BP 130/80 | HR 76 | Ht 64.5 in | Wt 148.0 lb

## 2019-02-20 DIAGNOSIS — M25551 Pain in right hip: Secondary | ICD-10-CM

## 2019-02-20 DIAGNOSIS — M533 Sacrococcygeal disorders, not elsewhere classified: Secondary | ICD-10-CM

## 2019-02-20 MED ORDER — TIZANIDINE HCL 4 MG PO CAPS: ORAL_CAPSULE | ORAL | 0 refills | Status: DC

## 2019-02-20 NOTE — Assessment & Plan Note (Signed)
Patient given injection today.  Tolerated procedure well.  Discussed icing regimen and home exercise, which activities of doing which will still avoid.  Increase activity slowly over the course the next several weeks.  Follow-up again in 4 to to 8 weeks.  Worsening pain will consider repeating a piriformis injection

## 2019-02-20 NOTE — Patient Instructions (Addendum)
Good to see you zanaflex sent in See me again in 4 weeks if not feeling better we will inject

## 2019-02-23 DIAGNOSIS — S134XXD Sprain of ligaments of cervical spine, subsequent encounter: Secondary | ICD-10-CM | POA: Diagnosis not present

## 2019-02-23 DIAGNOSIS — M533 Sacrococcygeal disorders, not elsewhere classified: Secondary | ICD-10-CM | POA: Diagnosis not present

## 2019-02-28 DIAGNOSIS — S134XXD Sprain of ligaments of cervical spine, subsequent encounter: Secondary | ICD-10-CM | POA: Diagnosis not present

## 2019-02-28 DIAGNOSIS — M533 Sacrococcygeal disorders, not elsewhere classified: Secondary | ICD-10-CM | POA: Diagnosis not present

## 2019-03-01 ENCOUNTER — Other Ambulatory Visit: Payer: Self-pay | Admitting: Family Medicine

## 2019-03-02 DIAGNOSIS — S134XXD Sprain of ligaments of cervical spine, subsequent encounter: Secondary | ICD-10-CM | POA: Diagnosis not present

## 2019-03-02 DIAGNOSIS — M533 Sacrococcygeal disorders, not elsewhere classified: Secondary | ICD-10-CM | POA: Diagnosis not present

## 2019-03-04 ENCOUNTER — Encounter: Payer: Self-pay | Admitting: Family Medicine

## 2019-03-07 DIAGNOSIS — M533 Sacrococcygeal disorders, not elsewhere classified: Secondary | ICD-10-CM | POA: Diagnosis not present

## 2019-03-07 DIAGNOSIS — S134XXD Sprain of ligaments of cervical spine, subsequent encounter: Secondary | ICD-10-CM | POA: Diagnosis not present

## 2019-03-07 NOTE — Telephone Encounter (Signed)
Routing to PCP for approval.

## 2019-03-09 NOTE — Telephone Encounter (Signed)
I spoke with the patient to try and schedule a virtual visit she said that it is late and she will just call us on Monday if she is still not feeling any better. I also scheduled her a physical for the end of the month.

## 2019-03-10 ENCOUNTER — Encounter: Payer: Self-pay | Admitting: Family Medicine

## 2019-03-14 DIAGNOSIS — S134XXD Sprain of ligaments of cervical spine, subsequent encounter: Secondary | ICD-10-CM | POA: Diagnosis not present

## 2019-03-14 DIAGNOSIS — M533 Sacrococcygeal disorders, not elsewhere classified: Secondary | ICD-10-CM | POA: Diagnosis not present

## 2019-03-14 NOTE — Progress Notes (Signed)
Patient left without being seen.

## 2019-03-15 ENCOUNTER — Other Ambulatory Visit: Payer: Self-pay

## 2019-03-15 ENCOUNTER — Ambulatory Visit (INDEPENDENT_AMBULATORY_CARE_PROVIDER_SITE_OTHER): Payer: BC Managed Care – PPO

## 2019-03-15 VITALS — BP 140/88 | HR 79 | Temp 97.7°F | Ht 64.5 in | Wt 150.0 lb

## 2019-03-15 DIAGNOSIS — R350 Frequency of micturition: Secondary | ICD-10-CM

## 2019-03-15 LAB — POCT URINALYSIS DIPSTICK
Bilirubin, UA: NEGATIVE
Blood, UA: NEGATIVE
Glucose, UA: NEGATIVE
Ketones, UA: NEGATIVE
Nitrite, UA: NEGATIVE
Protein, UA: NEGATIVE
Urobilinogen, UA: 0.2 E.U./dL
pH, UA: 6 (ref 5.0–8.0)

## 2019-03-16 ENCOUNTER — Encounter: Payer: Self-pay | Admitting: Obstetrics and Gynecology

## 2019-03-16 ENCOUNTER — Ambulatory Visit (INDEPENDENT_AMBULATORY_CARE_PROVIDER_SITE_OTHER): Payer: BC Managed Care – PPO | Admitting: Obstetrics and Gynecology

## 2019-03-16 VITALS — BP 118/68 | HR 88 | Temp 97.0°F | Resp 12 | Wt 148.0 lb

## 2019-03-16 DIAGNOSIS — N760 Acute vaginitis: Secondary | ICD-10-CM | POA: Diagnosis not present

## 2019-03-16 DIAGNOSIS — R3915 Urgency of urination: Secondary | ICD-10-CM

## 2019-03-16 DIAGNOSIS — M533 Sacrococcygeal disorders, not elsewhere classified: Secondary | ICD-10-CM | POA: Diagnosis not present

## 2019-03-16 DIAGNOSIS — S134XXD Sprain of ligaments of cervical spine, subsequent encounter: Secondary | ICD-10-CM | POA: Diagnosis not present

## 2019-03-16 LAB — POCT URINALYSIS DIPSTICK
Bilirubin, UA: NEGATIVE
Blood, UA: NEGATIVE
Glucose, UA: NEGATIVE
Ketones, UA: NEGATIVE
Nitrite, UA: NEGATIVE
Protein, UA: NEGATIVE
Urobilinogen, UA: 0.2 E.U./dL
pH, UA: 6 (ref 5.0–8.0)

## 2019-03-16 MED ORDER — NITROFURANTOIN MONOHYD MACRO 100 MG PO CAPS: 100.0000 mg | ORAL_CAPSULE | Freq: Two times a day (BID) | ORAL | 0 refills | Status: DC

## 2019-03-16 NOTE — Patient Instructions (Signed)
Urinary Tract Infection, Adult A urinary tract infection (UTI) is an infection of any part of the urinary tract. The urinary tract includes:  The kidneys.  The ureters.  The bladder.  The urethra. These organs make, store, and get rid of pee (urine) in the body. What are the causes? This is caused by germs (bacteria) in your genital area. These germs grow and cause swelling (inflammation) of your urinary tract. What increases the risk? You are more likely to develop this condition if:  You have a small, thin tube (catheter) to drain pee.  You cannot control when you pee or poop (incontinence).  You are female, and: ? You use these methods to prevent pregnancy: ? A medicine that kills sperm (spermicide). ? A device that blocks sperm (diaphragm). ? You have low levels of a female hormone (estrogen). ? You are pregnant.  You have genes that add to your risk.  You are sexually active.  You take antibiotic medicines.  You have trouble peeing because of: ? A prostate that is bigger than normal, if you are female. ? A blockage in the part of your body that drains pee from the bladder (urethra). ? A kidney stone. ? A nerve condition that affects your bladder (neurogenic bladder). ? Not getting enough to drink. ? Not peeing often enough.  You have other conditions, such as: ? Diabetes. ? A weak disease-fighting system (immune system). ? Sickle cell disease. ? Gout. ? Injury of the spine. What are the signs or symptoms? Symptoms of this condition include:  Needing to pee right away (urgently).  Peeing often.  Peeing small amounts often.  Pain or burning when peeing.  Blood in the pee.  Pee that smells bad or not like normal.  Trouble peeing.  Pee that is cloudy.  Fluid coming from the vagina, if you are female.  Pain in the belly or lower back. Other symptoms include:  Throwing up (vomiting).  No urge to eat.  Feeling mixed up (confused).  Being tired  and grouchy (irritable).  A fever.  Watery poop (diarrhea). How is this treated? This condition may be treated with:  Antibiotic medicine.  Other medicines.  Drinking enough water. Follow these instructions at home:  Medicines  Take over-the-counter and prescription medicines only as told by your doctor.  If you were prescribed an antibiotic medicine, take it as told by your doctor. Do not stop taking it even if you start to feel better. General instructions  Make sure you: ? Pee until your bladder is empty. ? Do not hold pee for a long time. ? Empty your bladder after sex. ? Wipe from front to back after pooping if you are a female. Use each tissue one time when you wipe.  Drink enough fluid to keep your pee pale yellow.  Keep all follow-up visits as told by your doctor. This is important. Contact a doctor if:  You do not get better after 1-2 days.  Your symptoms go away and then come back. Get help right away if:  You have very bad back pain.  You have very bad pain in your lower belly.  You have a fever.  You are sick to your stomach (nauseous).  You are throwing up. Summary  A urinary tract infection (UTI) is an infection of any part of the urinary tract.  This condition is caused by germs in your genital area.  There are many risk factors for a UTI. These include having a small, thin   tube to drain pee and not being able to control when you pee or poop.  Treatment includes antibiotic medicines for germs.  Drink enough fluid to keep your pee pale yellow. This information is not intended to replace advice given to you by your health care provider. Make sure you discuss any questions you have with your health care provider. Document Released: 11/10/2007 Document Revised: 05/11/2018 Document Reviewed: 12/01/2017 Elsevier Patient Education  2020 [employer]  
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
## 2019-03-17 LAB — URINALYSIS, MICROSCOPIC ONLY
Casts: NONE SEEN /lpf
RBC, Urine: NONE SEEN /hpf (ref 0–2)

## 2019-03-17 LAB — VAGINITIS/VAGINOSIS, DNA PROBE
Candida Species: NEGATIVE
Gardnerella vaginalis: POSITIVE — AB
Trichomonas vaginosis: NEGATIVE

## 2019-03-18 LAB — URINE CULTURE: Organism ID, Bacteria: NO GROWTH

## 2019-03-19 ENCOUNTER — Other Ambulatory Visit: Payer: Self-pay | Admitting: *Deleted

## 2019-03-19 MED ORDER — METRONIDAZOLE 500 MG PO TABS: 500.0000 mg | ORAL_TABLET | Freq: Two times a day (BID) | ORAL | 0 refills | Status: DC

## 2019-03-20 DIAGNOSIS — S134XXD Sprain of ligaments of cervical spine, subsequent encounter: Secondary | ICD-10-CM | POA: Diagnosis not present

## 2019-03-20 DIAGNOSIS — M533 Sacrococcygeal disorders, not elsewhere classified: Secondary | ICD-10-CM | POA: Diagnosis not present

## 2019-03-21 ENCOUNTER — Other Ambulatory Visit: Payer: Self-pay

## 2019-03-21 ENCOUNTER — Ambulatory Visit: Payer: BC Managed Care – PPO | Admitting: Family Medicine

## 2019-03-21 ENCOUNTER — Encounter: Payer: Self-pay | Admitting: Family Medicine

## 2019-03-21 ENCOUNTER — Ambulatory Visit (INDEPENDENT_AMBULATORY_CARE_PROVIDER_SITE_OTHER): Payer: BC Managed Care – PPO | Admitting: Family Medicine

## 2019-03-21 VITALS — BP 122/68 | HR 77 | Temp 97.4°F | Ht 64.5 in | Wt 151.5 lb

## 2019-03-21 DIAGNOSIS — R3915 Urgency of urination: Secondary | ICD-10-CM

## 2019-03-21 DIAGNOSIS — M25551 Pain in right hip: Secondary | ICD-10-CM | POA: Diagnosis not present

## 2019-03-21 DIAGNOSIS — R42 Dizziness and giddiness: Secondary | ICD-10-CM

## 2019-03-21 NOTE — Patient Instructions (Signed)

## 2019-03-21 NOTE — Progress Notes (Signed)
Subjective:     Patient ID: Gabriela Shepard, female   DOB: 03/01/1961, 58 y.o.   MRN: [id number]  HPI   [name] is here to discuss some balance issues and frequent falls.  She states she has had about 3 falls since last summer.  Two were particularly more significant.  On 12/22/2018 she caught her foot on a rug at work when she was walking and talking and felt on her right side.  She went to orthopedist.  Had multiple x-rays including cervical films with no acute findings.  Went to see her neurosurgeon and was told she had "whiplash" type injury.  Referred for physical therapy.  No syncope.  No head injury.    She then had a second fall October 2 when she was going up stairs and missed a step.  She states that she has intermittent dizziness which is a sensation of disequilibrium.  Occasional associated nausea.  Denies any lower extremity numbness or neuropathy symptoms.  No speech changes.  No dysphagia.  No consistent headaches.  No focal weakness.  Second issue is she has had some urine urgency.  Tries to avoid excessive caffeine.  No alcohol use.  Has seen urologist in the past.  No obstructive urinary symptoms.  Some nocturia  Past Medical History:  Diagnosis Date  . Arthritis   . Bowel habit changes    been going on couple years  . Depression   . Flatulence    excessive with strong odor/uncontrollable  . Headache   . 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 History of UTI   . Migraines   . PONV (postoperative nausea and vomiting)   . PTSD (post-traumatic stress disorder)   . STD (sexually transmitted disease)   . Substance abuse ([place])   . Urine incontinence    Past Surgical History:  Procedure Laterality Date  . BREAST BIOPSY Left    2017 benign x 2  . CESAREAN SECTION  1992  . COLONOSCOPY    . INCONTINENCE SURGERY    . LAPAROSCOPY     under bladder  . NECK SURGERY  02/24/2018   ACDF  . TOTAL HIP ARTHROPLASTY     left  . TOTAL KNEE ARTHROPLASTY Left  12/23/2015   Procedure: TOTAL KNEE ARTHROPLASTY;  Surgeon: Melrose Nakayama, MD;  Location: [place];  Service: Orthopedics;  Laterality: Left;    reports that she quit smoking about 8 years ago. Her smoking use included cigarettes. She has a 5.00 pack-year smoking history. She has never used smokeless tobacco. She reports that she does not drink alcohol or use drugs. family history includes Alcohol abuse in an other family member; Arthritis in an other family member; Drug abuse in her sister; Hypertension in her maternal grandmother, mother, and another family member; Kidney cancer in her father; Lung cancer in her mother; Mental illness in an other family member; Stomach cancer in her maternal uncle; Ulcers in her father. Allergies  Allergen Reactions  . Sulfa Antibiotics Hives     Review of Systems  Constitutional: Negative for chills, fatigue and fever.  Eyes: Negative for visual disturbance.  Respiratory: Negative for cough, chest tightness, shortness of breath and wheezing.   Cardiovascular: Negative for chest pain, palpitations and leg swelling.  Neurological: Positive for dizziness. Negative for seizures, syncope, weakness, light-headedness and headaches.       Objective:   Physical Exam Constitutional:      Appearance: She is well-developed.  Eyes:     Pupils: Pupils are equal, round, and reactive to light.  Neck:     Musculoskeletal: Neck supple.     Thyroid: No thyromegaly.     Vascular: No JVD.  Cardiovascular:     Rate and Rhythm: Normal rate and regular rhythm.     Heart sounds: No gallop.   Pulmonary:     Effort: Pulmonary effort is normal. No respiratory distress.     Breath sounds: Normal breath sounds. No wheezing or rales.  Neurological:     General: No focal deficit present.     Mental Status: She is alert and oriented to person, place, and time.     Cranial Nerves: No cranial nerve deficit.     Motor: No weakness.     Coordination: Coordination normal.      Gait: Gait normal.     Deep Tendon Reflexes: Reflexes normal.     Comments: She does have reproducible vertigo when turning head to the left and lying supine.  Normal sensation with monofilament of both feet.        Assessment:     #1 vertigo.  Suspect benign peripheral positional vertigo to the left.  Nonfocal neuro exam.  This did not appear to be associated with either fall.  #2 urinary urgency    Plan:     -Recommend Epley maneuvers for her vertigo and be in touch if this is not improving over the next week or 2 -Recommend regular structured exercise program to improve her balance.  She is already in PT. -reviewed red flag symptoms for more worrisome vertigo. -Discussed potential medication options for urinary urgency.  At this point she wishes to wait  Eulas Post MD [facility]

## 2019-03-27 ENCOUNTER — Encounter: Payer: Self-pay | Admitting: Family Medicine

## 2019-03-28 ENCOUNTER — Other Ambulatory Visit: Payer: Self-pay

## 2019-03-28 MED ORDER — DOXYCYCLINE HYCLATE 100 MG PO TABS: 100.0000 mg | ORAL_TABLET | Freq: Two times a day (BID) | ORAL | 0 refills | Status: DC

## 2019-04-02 DIAGNOSIS — Z20828 Contact with and (suspected) exposure to other viral communicable diseases: Secondary | ICD-10-CM | POA: Diagnosis not present

## 2019-04-03 ENCOUNTER — Emergency Department (HOSPITAL_BASED_OUTPATIENT_CLINIC_OR_DEPARTMENT_OTHER)
Admission: EM | Admit: 2019-04-03 | Discharge: 2019-04-03 | Disposition: A | Discharge status: Home / Self Care | Payer: BC Managed Care – PPO | Attending: Emergency Medicine | Admitting: Emergency Medicine

## 2019-04-03 ENCOUNTER — Emergency Department (HOSPITAL_BASED_OUTPATIENT_CLINIC_OR_DEPARTMENT_OTHER): Payer: BC Managed Care – PPO

## 2019-04-03 ENCOUNTER — Encounter (HOSPITAL_BASED_OUTPATIENT_CLINIC_OR_DEPARTMENT_OTHER): Payer: Self-pay | Admitting: *Deleted

## 2019-04-03 ENCOUNTER — Other Ambulatory Visit: Payer: Self-pay

## 2019-04-03 ENCOUNTER — Encounter: Payer: Self-pay | Admitting: Family Medicine

## 2019-04-03 ENCOUNTER — Telehealth (INDEPENDENT_AMBULATORY_CARE_PROVIDER_SITE_OTHER): Payer: BC Managed Care – PPO | Admitting: Family Medicine

## 2019-04-03 ENCOUNTER — Ambulatory Visit: Payer: Self-pay

## 2019-04-03 DIAGNOSIS — Z87891 Personal history of nicotine dependence: Secondary | ICD-10-CM | POA: Diagnosis not present

## 2019-04-03 DIAGNOSIS — R1033 Periumbilical pain: Secondary | ICD-10-CM | POA: Diagnosis not present

## 2019-04-03 DIAGNOSIS — R109 Unspecified abdominal pain: Secondary | ICD-10-CM | POA: Diagnosis not present

## 2019-04-03 DIAGNOSIS — R1031 Right lower quadrant pain: Secondary | ICD-10-CM | POA: Diagnosis not present

## 2019-04-03 DIAGNOSIS — R300 Dysuria: Secondary | ICD-10-CM | POA: Insufficient documentation

## 2019-04-03 DIAGNOSIS — R5383 Other fatigue: Secondary | ICD-10-CM

## 2019-04-03 DIAGNOSIS — R0602 Shortness of breath: Secondary | ICD-10-CM

## 2019-04-03 DIAGNOSIS — Z20828 Contact with and (suspected) exposure to other viral communicable diseases: Secondary | ICD-10-CM | POA: Diagnosis not present

## 2019-04-03 DIAGNOSIS — R05 Cough: Secondary | ICD-10-CM

## 2019-04-03 DIAGNOSIS — Z20822 Contact with and (suspected) exposure to covid-19: Secondary | ICD-10-CM

## 2019-04-03 DIAGNOSIS — N760 Acute vaginitis: Secondary | ICD-10-CM | POA: Diagnosis not present

## 2019-04-03 DIAGNOSIS — R5381 Other malaise: Secondary | ICD-10-CM | POA: Diagnosis not present

## 2019-04-03 DIAGNOSIS — Z79899 Other long term (current) drug therapy: Secondary | ICD-10-CM | POA: Insufficient documentation

## 2019-04-03 DIAGNOSIS — R9431 Abnormal electrocardiogram [ECG] [EKG]: Secondary | ICD-10-CM | POA: Diagnosis not present

## 2019-04-03 DIAGNOSIS — B9689 Other specified bacterial agents as the cause of diseases classified elsewhere: Secondary | ICD-10-CM | POA: Diagnosis not present

## 2019-04-03 DIAGNOSIS — R059 Cough, unspecified: Secondary | ICD-10-CM

## 2019-04-03 LAB — URINALYSIS, ROUTINE W REFLEX MICROSCOPIC
Bilirubin Urine: NEGATIVE
Glucose, UA: NEGATIVE mg/dL
Hgb urine dipstick: NEGATIVE
Ketones, ur: NEGATIVE mg/dL
Nitrite: NEGATIVE
Protein, ur: NEGATIVE mg/dL
Specific Gravity, Urine: <1.005 — ABNORMAL LOW (ref 1.005–1.030)
pH: 7 (ref 5.0–8.0)

## 2019-04-03 LAB — COMPREHENSIVE METABOLIC PANEL
ALT: 24 U/L (ref 0–44)
AST: 20 U/L (ref 15–41)
Albumin: 3.9 g/dL (ref 3.5–5.0)
Alkaline Phosphatase: 45 U/L (ref 38–126)
Anion gap: 10 (ref 5–15)
BUN: 9 mg/dL (ref 6–20)
CO2: 24 mmol/L (ref 22–32)
Calcium: 8.5 mg/dL — ABNORMAL LOW (ref 8.9–10.3)
Chloride: 89 mmol/L — ABNORMAL LOW (ref 98–111)
Creatinine, Ser: 0.67 mg/dL (ref 0.44–1.00)
GFR calc Af Amer: >60 mL/min (ref >60)
GFR calc non Af Amer: >60 mL/min (ref >60)
Glucose, Bld: 94 mg/dL (ref 70–99)
Potassium: 3.9 mmol/L (ref 3.5–5.1)
Sodium: 123 mmol/L — ABNORMAL LOW (ref 135–145)
Total Bilirubin: 0.8 mg/dL (ref 0.3–1.2)
Total Protein: 7 g/dL (ref 6.5–8.1)

## 2019-04-03 LAB — CBC WITH DIFFERENTIAL/PLATELET
Abs Immature Granulocytes: 0.02 10*3/uL (ref 0.00–0.07)
Basophils Absolute: 0 10*3/uL (ref 0.0–0.1)
Basophils Relative: 0 %
Eosinophils Absolute: 0 10*3/uL (ref 0.0–0.5)
Eosinophils Relative: 1 %
HCT: 39.9 % (ref 36.0–46.0)
Hemoglobin: 13.3 g/dL (ref 12.0–15.0)
Immature Granulocytes: 0 %
Lymphocytes Relative: 28 %
Lymphs Abs: 2.4 10*3/uL (ref 0.7–4.0)
MCH: 29 pg (ref 26.0–34.0)
MCHC: 33.3 g/dL (ref 30.0–36.0)
MCV: 86.9 fL (ref 80.0–100.0)
Monocytes Absolute: 0.6 10*3/uL (ref 0.1–1.0)
Monocytes Relative: 8 %
Neutro Abs: 5.4 10*3/uL (ref 1.7–7.7)
Neutrophils Relative %: 63 %
Platelets: 346 10*3/uL (ref 150–400)
RBC: 4.59 MIL/uL (ref 3.87–5.11)
RDW: 13.1 % (ref 11.5–15.5)
WBC: 8.5 10*3/uL (ref 4.0–10.5)
nRBC: 0 % (ref 0.0–0.2)

## 2019-04-03 LAB — URINALYSIS, MICROSCOPIC (REFLEX)

## 2019-04-03 LAB — LIPASE, BLOOD: Lipase: 28 U/L (ref 11–51)

## 2019-04-03 MED ORDER — ONDANSETRON 4 MG PO TBDP: 4.0000 mg | ORAL_TABLET | Freq: Three times a day (TID) | ORAL | 0 refills | Status: DC | PRN

## 2019-04-03 MED ORDER — PHENAZOPYRIDINE HCL 100 MG PO TABS
95.0000 mg | ORAL_TABLET | Freq: Once | ORAL | Status: AC
  Administered 2019-04-03: 100 mg via ORAL
  Filled 2019-04-03: qty 1

## 2019-04-03 MED ORDER — IOHEXOL 300 MG/ML  SOLN
100.0000 mL | Freq: Once | INTRAMUSCULAR | Status: AC | PRN
  Administered 2019-04-03: 100 mL via INTRAVENOUS

## 2019-04-03 MED ORDER — PHENAZOPYRIDINE HCL 200 MG PO TABS: 200.0000 mg | ORAL_TABLET | Freq: Three times a day (TID) | ORAL | 0 refills | Status: DC

## 2019-04-03 MED ORDER — METRONIDAZOLE 500 MG PO TABS: 500.0000 mg | ORAL_TABLET | Freq: Two times a day (BID) | ORAL | 0 refills | Status: DC

## 2019-04-03 NOTE — Telephone Encounter (Signed)
Pt. Was exposed to COVID 19 Saturday. Tested for COVID 19 at [facility] yesterday. Last night developed sweats, chills, headache ,sore throat, dry cough, chest tightness. Warm transfer to Tammy in the practice for a virtual visit. Answer Assessment - Initial Assessment Questions 1. CLOSE CONTACT: "Who is the person with the confirmed or suspected COVID-19 infection that you were exposed to?"     Her sponsor 2. PLACE of CONTACT: "Where were you when you were exposed to COVID-19?" (e.g., home, school, medical waiting room; which city?)     Home 3. TYPE of CONTACT: "How much contact was there?" (e.g., sitting next to, live in same house, work in same office, same building)     2 hours 4. DURATION of CONTACT: "How long were you in contact with the COVID-19 patient?" (e.g., a few seconds, passed by person, a few minutes, live with the patient)     2 hours 5. DATE of CONTACT: "When did you have contact with a COVID-19 patient?" (e.g., how many days ago)     Saturday 6. TRAVEL: "Have you traveled out of the country recently?" If so, "When and where?"     * Also ask about out-of-state travel, since the CDC has identified some high-risk cities for community spread in the [place].     * Note: Travel becomes less relevant if there is widespread community transmission where the patient lives.     No 7. COMMUNITY SPREAD: "Are there lots of cases of COVID-19 (community spread) where you live?" (See public health department website, if unsure)       Yes 8. SYMPTOMS: "Do you have any symptoms?" (e.g., fever, cough, breathing difficulty)     Chills, sweating, sore throat,dry cough, headache,weakness, chest tightness 9. PREGNANCY OR POSTPARTUM: "Is there any chance you are pregnant?" "When was your last menstrual period?" "Did you deliver in the last 2 weeks?"     No 10. HIGH RISK: "Do you have any heart or lung problems? Do you have a weak immune system?" (e.g., CHF, COPD, asthma, HIV positive, chemotherapy, renal  failure, diabetes mellitus, sickle cell anemia)       No  Protocols used: CORONAVIRUS (COVID-19) EXPOSURE-A-AH

## 2019-04-03 NOTE — ED Provider Notes (Signed)
[facility] EMERGENCY DEPARTMENT Provider Note   CSN: [id number] Arrival date & time: 04/03/19  1657     History   Chief Complaint Chief Complaint  Patient presents with  . Fatigue    HPI Gabriela Shepard is a 58 y.o. female.     HPI   58yo female with history of PTSD, depression, migraines, presents with concern for fatigue, cough, sore throat, palpitations, headache, decreased appetite, diarrhea, after known COVID19 exposure and continued dysuria and vaginal odor after recent treatment for BV.     Vaginal odor, dysuria 2 weeks ago, finished flagyl 2 days ago for BV, felt significantly improved, but could tell it wasn't gone as it continued to have an odor.  Not sexually active. Diagnosed with interstitial cystitis in 75s.    Diarrhea, has not eaten much. Abdominal pain. Abdominal pain periumbilical, not severe pain, pressed on abdomen and it was tender in RLQ on televisit and sent here.   Fatigue for 2 days, cough for 2 days, mild Sore throat, dyspnea, palpitations, headache, decreased appetite, no taste or smells, abdominal pain  Known exposure to COVID on Saturday without wearing a mask   Past Medical History:  Diagnosis Date  . Arthritis   . Bowel habit changes    been going on couple years  . Depression   . Flatulence    excessive with strong odor/uncontrollable  . Headache   . 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 History of UTI   . Migraines   . PONV (postoperative nausea and vomiting)   . PTSD (post-traumatic stress disorder)   . STD (sexually transmitted disease)   . Substance abuse ([place])   . Urine incontinence     Patient Active Problem List   Diagnosis Date Noted  . 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 Piriformis syndrome, right 08/09/2018  . Weight gain 02/08/2018  . Trigger point of right shoulder region 12/22/2017  . Cervical radiculopathy at C6 11/29/2017  . Osteopenia 03/09/2016  . Primary osteoarthritis  of left knee 12/23/2015  . IBS (irritable bowel syndrome) 02/19/2015  . Postmenopausal 12/19/2013  . Migraine headache 12/05/2012  . Pain, upper back 09/24/2010  . Depression 09/04/2010    Past Surgical History:  Procedure Laterality Date  . BREAST BIOPSY Left    2017 benign x 2  . CESAREAN SECTION  1992  . COLONOSCOPY    . INCONTINENCE SURGERY    . LAPAROSCOPY     under bladder  . NECK SURGERY  02/24/2018   ACDF  . TOTAL HIP ARTHROPLASTY     left  . TOTAL KNEE ARTHROPLASTY Left 12/23/2015   Procedure: TOTAL KNEE ARTHROPLASTY;  Surgeon: Melrose Nakayama, MD;  Location: [facility];  Service: Orthopedics;  Laterality: Left;     OB History    Gravida  2   Para  2   Term  2   Preterm      AB      Living  2     SAB      TAB      Ectopic      Multiple      Live Births  2            Home Medications    Prior to Admission medications   Medication Sig Start Date End Date Taking? Authorizing Provider  Calcium Carbonate-Vit D-Min (CALCIUM 1200 PO) Take 2 tablets by mouth daily as needed.  [provider]  clonazePAM (KLONOPIN) 1 MG tablet TAKE 1 TABLET BY MOUTH AT BEDTIME EVERY DAY 03/07/19   Burchette, Alinda Sierras, MD  esomeprazole (NEXIUM) 40 MG capsule TAKE 1 CAPSULE BY MOUTH EVERY DAY 01/10/19   Lyndal Pulley, DO  Estradiol 10 MCG TABS vaginal tablet Use one tablet qhs x 1 week, then 2 x a week at hs 04/18/18   Salvadore Dom, MD  fluticasone [facility]) 50 MCG/ACT nasal spray Place 2 sprays daily into both nostrils. 04/14/17   Burchette, Alinda Sierras, MD  hydrOXYzine (ATARAX/VISTARIL) 10 MG tablet TAKE 1 TABLET (10 MG TOTAL) BY MOUTH 3 (THREE) TIMES DAILY AS NEEDED FOR ANXIETY. 11/01/18   Lyndal Pulley, DO  hyoscyamine (LEVBID) 0.375 MG 12 hr tablet TAKE 1 TABLET BY MOUTH TWICE A DAY Patient taking differently: Take 0.375 mg by mouth 2 (two) times daily.  04/08/16   Burchette, Alinda Sierras, MD  levothyroxine (SYNTHROID) 25 MCG tablet TAKE 1 TABLET BY MOUTH EVERY  DAY BEFORE BREAKFAST 02/13/19   Hulan Saas M, DO  lidocaine (XYLOCAINE) 5 % ointment APPLY 1 APPLICATION TOPICALLY 4 TIMES DAILY AS NEEDED 04/30/18   Salvadore Dom, MD  MAGNESIUM PO Take 2 tablets by mouth daily.    [provider]  metroNIDAZOLE (FLAGYL) 500 MG tablet Take 1 tablet (500 mg total) by mouth 2 (two) times daily. 04/03/19   Gareth Morgan, MD  montelukast (SINGULAIR) 10 MG tablet Take 1 tablet (10 mg total) by mouth at bedtime. 08/09/18   Lyndal Pulley, DO  ondansetron (ZOFRAN ODT) 4 MG disintegrating tablet Take 1 tablet (4 mg total) by mouth every 8 (eight) hours as needed for nausea or vomiting. 04/03/19   Gareth Morgan, MD  phenazopyridine (PYRIDIUM) 200 MG tablet Take 1 tablet (200 mg total) by mouth 3 (three) times daily. 04/03/19   Gareth Morgan, MD  promethazine (PHENERGAN) 25 MG tablet TAKE 1 TABLET BY MOUTH EVERY 6 HOURS AS NEEDED FOR NAUSEA AND VOMITING 05/22/18   Burchette, Alinda Sierras, MD  SUMAtriptan (IMITREX) 100 MG tablet TAKE 1 TABLET BY MOUTH AS NEEDED FOR HEADACHE, IF HEADACHE PERSISTS OR RECURS REPEAT IN 2 HOURS 09/28/18   Burchette, Alinda Sierras, MD  tiZANidine (ZANAFLEX) 4 MG capsule 1 tablet at night 02/20/19   Lyndal Pulley, DO  valACYclovir (VALTREX) 1000 MG tablet Take 1,000 mg by mouth as needed (outbreak).  07/08/15   [provider]  valACYclovir (VALTREX) 500 MG tablet TAKE 2 TABLETS FOR 3 DAYS AND THEN 1 TABLET DAILY AS NEEDED 08/23/18   Burchette, Alinda Sierras, MD  venlafaxine XR (EFFEXOR-XR) 150 MG 24 hr capsule Take 1 capsule (150 mg total) by mouth daily with breakfast. 05/18/18   Salvadore Dom, MD  Vitamin D, Ergocalciferol, (DRISDOL) 1.25 MG (50000 UT) CAPS capsule TAKE 1 CAPSULE (50,000 UNITS TOTAL) BY MOUTH EVERY 7 (SEVEN) DAYS. 05/12/18   Lyndal Pulley, DO    Family History Family History  Problem Relation Age of Onset  . Hypertension Mother   . Lung cancer Mother   . Alcohol abuse Other   . Arthritis Other   .  Hypertension Other   . Mental illness Other   . Kidney cancer Father   . Ulcers Father   . Drug abuse Sister   . Hypertension Maternal Grandmother   . Stomach cancer Maternal Uncle   . Colon cancer Neg Hx   . Esophageal cancer Neg Hx     Social History Social History  Tobacco Use  . Smoking status: Former Smoker    Packs/day: 0.50    Years: 10.00    Pack years: 5.00    Types: Cigarettes    Quit date: 11/13/2010    Years since quitting: 8.3  . Smokeless tobacco: Never Used  Substance Use Topics  . Alcohol use: No  . Drug use: No     Allergies   Sulfa antibiotics   Review of Systems Review of Systems  Constitutional: Positive for appetite change, chills and fatigue. Negative for fever.  HENT: Positive for congestion and sore throat.   Eyes: Negative for visual disturbance.  Respiratory: Positive for cough. Negative for shortness of breath.   Cardiovascular: Positive for palpitations. Negative for chest pain.  Gastrointestinal: Positive for abdominal pain and diarrhea.  Genitourinary: Positive for dysuria. Negative for difficulty urinating and vaginal discharge.  Musculoskeletal: Negative for back pain and neck pain.  Skin: Negative for rash.  Neurological: Negative for syncope and headaches.     Physical Exam Updated Vital Signs BP 128/78 (BP Location: Right Arm)   Pulse 70   Temp 98.6 F (37 C) (Oral)   Resp 16   Ht 5' 4.5" (1.638 m)   Wt 68 kg   LMP 06/07/2010 (Approximate)   SpO2 99%   BMI 25.34 kg/m   Physical Exam Vitals signs and nursing note reviewed.  Constitutional:      General: She is not in acute distress.    Appearance: She is well-developed. She is not diaphoretic.  HENT:     Head: Normocephalic and atraumatic.  Eyes:     Conjunctiva/sclera: Conjunctivae normal.  Neck:     Musculoskeletal: Normal range of motion.  Cardiovascular:     Rate and Rhythm: Normal rate and regular rhythm.     Heart sounds: Normal heart sounds.   Pulmonary:     Effort: Pulmonary effort is normal. No respiratory distress.     Breath sounds: Normal breath sounds. No wheezing or rales.  Abdominal:     General: There is no distension.     Palpations: Abdomen is soft.     Tenderness: There is abdominal tenderness (RLQ). There is no guarding.  Musculoskeletal:        General: No tenderness.  Skin:    General: Skin is warm and dry.     Findings: No erythema or rash.  Neurological:     Mental Status: She is alert and oriented to person, place, and time.      ED Treatments / Results  Labs (all labs ordered are listed, but only abnormal results are displayed) Labs Reviewed  URINALYSIS, ROUTINE W REFLEX MICROSCOPIC - Abnormal; Notable for the following components:      Result Value   Specific Gravity, Urine <1.005 (*)    Leukocytes,Ua TRACE (*)    All other components within normal limits  URINALYSIS, MICROSCOPIC (REFLEX) - Abnormal; Notable for the following components:   Bacteria, UA FEW (*)    All other components within normal limits  COMPREHENSIVE METABOLIC PANEL - Abnormal; Notable for the following components:   Sodium 123 (*)    Chloride 89 (*)    Calcium 8.5 (*)    All other components within normal limits  CBC WITH DIFFERENTIAL/PLATELET  LIPASE, BLOOD    EKG EKG Interpretation  Date/Time:  Tuesday April 03 2019 19:47:13 EDT Ventricular Rate:  69 PR Interval:    QRS Duration: 99 QT Interval:  459 QTC Calculation: 492 R Axis:   79 Text Interpretation:  Sinus rhythm Abnormal R-wave progression, early transition Borderline prolonged QT interval Baseline wander in lead(s) V5 Confirmed by Nat Christen [id number]) on 04/04/2019 8:14:43 PM   Radiology Ct Abdomen Pelvis W Contrast  Result Date: 04/03/2019 CLINICAL DATA:  Diffuse abdominal pain, initial encounter EXAM: CT ABDOMEN AND PELVIS WITH CONTRAST TECHNIQUE: Multidetector CT imaging of the abdomen and pelvis was performed using the standard protocol following  bolus administration of intravenous contrast. CONTRAST:  121mL OMNIPAQUE IOHEXOL 300 MG/ML  SOLN COMPARISON:  None. FINDINGS: Lower chest: No acute abnormality. Hepatobiliary: Liver demonstrates diffuse fatty infiltration. Tiny dependent density is noted within the gallbladder likely representing a small gallstone. Pancreas: Unremarkable. No pancreatic ductal dilatation or surrounding inflammatory changes. Spleen: Normal in size without focal abnormality. Adrenals/Urinary Tract: Adrenal glands are within normal limits. Kidneys are well visualized without renal calculi or obstructive change. The ureters are within normal limits. The bladder is well distended. Stomach/Bowel: The appendix is not well visualized. No inflammatory changes are seen. Colon is predominately decompressed no small bowel or gastric abnormality is noted. Vascular/Lymphatic: Aortic atherosclerosis. No enlarged abdominal or pelvic lymph nodes. Reproductive: Uterus and bilateral adnexa are unremarkable. Other: No abdominal wall hernia or abnormality. No abdominopelvic ascites. Musculoskeletal: Postsurgical changes in proximal left femur are seen. No acute bony abnormality is noted. IMPRESSION: Likely tiny gallstone. No acute abnormality is noted. Electronically Signed   By: Inez Catalina M.D.   On: 04/03/2019 20:46    Procedures Procedures (including critical care time)  Medications Ordered in ED Medications  phenazopyridine (PYRIDIUM) tablet 100 mg (100 mg Oral Given 04/03/19 2010)  iohexol (OMNIPAQUE) 300 MG/ML solution 100 mL (100 mLs Intravenous Contrast Given 04/03/19 2040)     Initial Impression / Assessment and Plan / ED Course  I have reviewed the triage vital signs and the nursing notes.  Pertinent labs & imaging results that were available during my care of the patient were reviewed by me and considered in my medical decision making (see chart for details).        58yo female with history of PTSD, depression,  migraines, presents with concern for fatigue, cough, sore throat, palpitations, headache, decreased appetite, diarrhea, after known COVID19 exposure and continued dysuria and vaginal odor after recent treatment for BV.   Labs show no anemia, no AKI.  Has previous history of hyponatremia. RLQ abdominal pain/tenderness. CT abdomen/pelvis shows no acute findings. Tiny gallstone without hx to suggest cholecystitis/symptomatic cholelithiasis. Palpitations present, ECG without acute findings. CXR WNL.  Combination of symptoms consistent with COVID19 infection with loss of taste and smell, cough, diarrhea, fatigue.    UA negative, does have history of interstitial cystitis and feel this may contribute to urinary symptoms.  Given flagyl as she reported improvement with this initially, declines pelvic exam. Given pyridium for urinary discomfort.  Recommend continued supportive care, quarantine.    Final Clinical Impressions(s) / ED Diagnoses   Final diagnoses:  Suspected COVID-19 virus infection  Dysuria  Bacterial vaginosis  Periumbilical abdominal pain  Fatigue, unspecified type    ED Discharge Orders         Ordered    metroNIDAZOLE (FLAGYL) 500 MG tablet  2 times daily     04/03/19 2101    phenazopyridine (PYRIDIUM) 200 MG tablet  3 times daily     04/03/19 2101    ondansetron (ZOFRAN ODT) 4 MG disintegrating tablet  Every 8 hours PRN     04/03/19 2102           Danil Wedge,  Junie Panning, MD 04/05/19 2009

## 2019-04-03 NOTE — ED Notes (Addendum)
Pt sts she is awaiting a COVID test from Monday; she was around a confirmed positive COVID person on Saturday without a mask. C/o chills, "night sweats".

## 2019-04-03 NOTE — Telephone Encounter (Addendum)
Pt has video visit today at 4 with Dr. [name] this is a [place]

## 2019-04-03 NOTE — Progress Notes (Addendum)
Virtual Visit via Video Note  I connected with Gabriela Shepard  on 04/03/19 at  4:00 PM EDT by a video enabled telemedicine application and verified that I am speaking with the correct person using two identifiers.  Location patient: home Location provider:work or home office Persons participating in the virtual visit: patient, provider  I discussed the limitations of evaluation and management by telemedicine and the availability of in person appointments. The patient expressed understanding and agreed to proceed.   HPI:  Acute visit for resp illness: -started yesterday -symptoms include sore throat, fatigue, malaise - feels like it is difficult to get out of bed, headache, chest heaviness, feels like heart is going to jump out of her chest, dry cough, body aches, sweats, chills, loss of taste. She had diarrhea once. -she also is having burning with urination and urinary frequency (recently on flagyl) - reports this has been going on for some time and is seeing gynecology for this but is frustrated as it has gotten worse instead of better -she also is having abd pain today RLQ, very tender to touch -she had a positive COVID exposure over the weekend - she spent several hours with her sponsor without masks 4 feet apart outside 4 days ago, she received call yesterday that sponsor had COVID positive test monday -denies hematochezia, hematuria -lives alone, felt like this once in the past and ended up in the hospital with severe illness  ROS: See pertinent positives and negatives per HPI.  Past Medical History:  Diagnosis Date  . Arthritis   . Bowel habit changes    been going on couple years  . Depression   . Flatulence    excessive with strong odor/uncontrollable  . Headache   . 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 History of UTI   . Migraines   . PONV (postoperative nausea and vomiting)   . PTSD (post-traumatic stress disorder)   . STD (sexually transmitted disease)    . Substance abuse ([place])   . Urine incontinence     Past Surgical History:  Procedure Laterality Date  . BREAST BIOPSY Left    2017 benign x 2  . CESAREAN SECTION  1992  . COLONOSCOPY    . INCONTINENCE SURGERY    . LAPAROSCOPY     under bladder  . NECK SURGERY  02/24/2018   ACDF  . TOTAL HIP ARTHROPLASTY     left  . TOTAL KNEE ARTHROPLASTY Left 12/23/2015   Procedure: TOTAL KNEE ARTHROPLASTY;  Surgeon: Melrose Nakayama, MD;  Location: [facility];  Service: Orthopedics;  Laterality: Left;    Family History  Problem Relation Age of Onset  . Hypertension Mother   . Lung cancer Mother   . Alcohol abuse Other   . Arthritis Other   . Hypertension Other   . Mental illness Other   . Kidney cancer Father   . Ulcers Father   . Drug abuse Sister   . Hypertension Maternal Grandmother   . Stomach cancer Maternal Uncle   . Colon cancer Neg Hx   . Esophageal cancer Neg Hx     SOCIAL HX: see hpi   Current Outpatient Medications:  .  Calcium Carbonate-Vit D-Min (CALCIUM 1200 PO), Take 2 tablets by mouth daily as needed. , Disp: , Rfl:  .  clonazePAM (KLONOPIN) 1 MG tablet, TAKE 1 TABLET BY MOUTH AT BEDTIME EVERY DAY, Disp: 90 tablet, Rfl: 0 .  doxycycline (VIBRA-TABS) 100 MG tablet,  Take 1 tablet (100 mg total) by mouth 2 (two) times daily., Disp: 14 tablet, Rfl: 0 .  esomeprazole (NEXIUM) 40 MG capsule, TAKE 1 CAPSULE BY MOUTH EVERY DAY, Disp: 90 capsule, Rfl: 3 .  Estradiol 10 MCG TABS vaginal tablet, Use one tablet qhs x 1 week, then 2 x a week at hs, Disp: 24 tablet, Rfl: 3 .  fluticasone (FLONASE) 50 MCG/ACT nasal spray, Place 2 sprays daily into both nostrils., Disp: 16 g, Rfl: 11 .  gabapentin (NEURONTIN) 100 MG capsule, TAKE 2 CAPSULES (200 MG TOTAL) BY MOUTH AT BEDTIME., Disp: 180 capsule, Rfl: 1 .  hydrOXYzine (ATARAX/VISTARIL) 10 MG tablet, TAKE 1 TABLET (10 MG TOTAL) BY MOUTH 3 (THREE) TIMES DAILY AS NEEDED FOR ANXIETY., Disp: 270 tablet, Rfl: 1 .  hyoscyamine (LEVBID) 0.375 MG  12 hr tablet, TAKE 1 TABLET BY MOUTH TWICE A DAY (Patient taking differently: Take 0.375 mg by mouth 2 (two) times daily. ), Disp: 60 tablet, Rfl: 0 .  levothyroxine (SYNTHROID) 25 MCG tablet, TAKE 1 TABLET BY MOUTH EVERY DAY BEFORE BREAKFAST, Disp: 90 tablet, Rfl: 0 .  lidocaine (XYLOCAINE) 5 % ointment, APPLY 1 APPLICATION TOPICALLY 4 TIMES DAILY AS NEEDED, Disp: 35.44 g, Rfl: 0 .  MAGNESIUM PO, Take 2 tablets by mouth daily., Disp: , Rfl:  .  methocarbamol (ROBAXIN) 500 MG tablet, Take 500 mg by mouth 4 (four) times daily as needed for pain., Disp: , Rfl: 0 .  metroNIDAZOLE (FLAGYL) 500 MG tablet, Take 1 tablet (500 mg total) by mouth 2 (two) times daily., Disp: 14 tablet, Rfl: 0 .  montelukast (SINGULAIR) 10 MG tablet, Take 1 tablet (10 mg total) by mouth at bedtime., Disp: 90 tablet, Rfl: 3 .  promethazine (PHENERGAN) 25 MG tablet, TAKE 1 TABLET BY MOUTH EVERY 6 HOURS AS NEEDED FOR NAUSEA AND VOMITING, Disp: 12 tablet, Rfl: 0 .  SUMAtriptan (IMITREX) 100 MG tablet, TAKE 1 TABLET BY MOUTH AS NEEDED FOR HEADACHE, IF HEADACHE PERSISTS OR RECURS REPEAT IN 2 HOURS, Disp: 9 tablet, Rfl: 4 .  tiZANidine (ZANAFLEX) 4 MG capsule, 1 tablet at night, Disp: 30 capsule, Rfl: 0 .  traZODone (DESYREL) 100 MG tablet, TAKE 1 TABLET BY MOUTH AT BEDTIME AS NEEDED FOR SLEEP, Disp: 90 tablet, Rfl: 1 .  valACYclovir (VALTREX) 1000 MG tablet, Take 1,000 mg by mouth as needed (outbreak). , Disp: , Rfl: 2 .  valACYclovir (VALTREX) 500 MG tablet, TAKE 2 TABLETS FOR 3 DAYS AND THEN 1 TABLET DAILY AS NEEDED, Disp: 90 tablet, Rfl: 2 .  venlafaxine XR (EFFEXOR-XR) 150 MG 24 hr capsule, Take 1 capsule (150 mg total) by mouth daily with breakfast., Disp: 90 capsule, Rfl: 3 .  Vitamin D, Ergocalciferol, (DRISDOL) 1.25 MG (50000 UT) CAPS capsule, TAKE 1 CAPSULE (50,000 UNITS TOTAL) BY MOUTH EVERY 7 (SEVEN) DAYS., Disp: 12 capsule, Rfl: 0  EXAM:  VITALS per patient if applicable:  GENERAL: alert, oriented, appears to feel  poorly  HEENT: atraumatic, conjunttiva clear, no obvious abnormalities on inspection of external nose and ears  NECK: normal movements of the head and neck  LUNGS: on inspection no signs of respiratory distress, breathing rate appears normal, no obvious gross SOB, gasping or wheezing  CV: no obvious cyanosis  MS: moves all visible extremities without noticeable abnormality  ABD: on patient self exam she reports focal TTP at a point in the RLQ, reports is very tender there even with light palpation  PSYCH/NEURO: pleasant and cooperative, no obvious depression or anxiety,  speech and thought processing grossly intact  ASSESSMENT AND PLAN:  Discussed the following assessment and plan:  Malaise  Cough  Right lower quadrant abdominal pain  Dysuria  Close exposure to COVID-19 virus  -we discussed possible serious and likely etiologies, options for evaluation and workup, limitations of telemedicine visit vs in person visit, treatment, treatment risks and precautions. Pt prefers to treat via telemedicine empirically rather then risking or undertaking an in person visit at this moment. However, given the severity of her symptoms advised referral for inperson exam. She could have COVID19 given her exposure, but could have another or 2ndary etiology give the urinary symptoms and abdominal pain. After some persuasion and a lengthy discussion she agrees to go to the ER for evaluation. She refuses EMS transport. Advised assistant to notify [facility] staff.    I discussed the assessment and treatment plan with the patient. The patient was provided an opportunity to ask questions and all were answered. The patient agreed with the plan and demonstrated an understanding of the instructions.   The patient was advised to call back or seek an in-person evaluation if the symptoms worsen or if the condition fails to improve as anticipated.   Lucretia Kern, DO

## 2019-04-03 NOTE — ED Triage Notes (Signed)
Pt c/o URi sytmpoms  With fatigue, recent UTI completed ABX

## 2019-04-04 ENCOUNTER — Encounter: Payer: Self-pay | Admitting: Family Medicine

## 2019-04-05 ENCOUNTER — Other Ambulatory Visit: Payer: Self-pay

## 2019-04-05 ENCOUNTER — Telehealth (INDEPENDENT_AMBULATORY_CARE_PROVIDER_SITE_OTHER): Payer: BC Managed Care – PPO | Admitting: Family Medicine

## 2019-04-05 DIAGNOSIS — F419 Anxiety disorder, unspecified: Secondary | ICD-10-CM | POA: Diagnosis not present

## 2019-04-05 DIAGNOSIS — E871 Hypo-osmolality and hyponatremia: Secondary | ICD-10-CM | POA: Diagnosis not present

## 2019-04-05 DIAGNOSIS — Z20828 Contact with and (suspected) exposure to other viral communicable diseases: Secondary | ICD-10-CM | POA: Diagnosis not present

## 2019-04-05 DIAGNOSIS — M791 Myalgia, unspecified site: Secondary | ICD-10-CM

## 2019-04-05 DIAGNOSIS — Z20822 Contact with and (suspected) exposure to covid-19: Secondary | ICD-10-CM

## 2019-04-05 NOTE — Progress Notes (Signed)
This visit type was conducted due to national recommendations for restrictions regarding the COVID-19 pandemic in an effort to limit this patient's exposure and mitigate transmission in our community.   Virtual Visit via Telephone Note  I connected with Gabriela Shepard on 04/05/19 at 10:30 AM EDT by telephone and verified that I am speaking with the correct person using two identifiers.   I discussed the limitations, risks, security and privacy concerns of performing an evaluation and management service by telephone and the availability of in person appointments. I also discussed with the patient that there may be a patient responsible charge related to this service. The patient expressed understanding and agreed to proceed.  Location patient: home Location provider: work or home office Participants present for the call: patient, provider Patient did not have a visit in the prior 7 days to address this/these issue(s).   History of Present Illness:  Gabriela Shepard had sent a note yesterday with the following concerns  She related first of all increased anxiety and stress symptoms.  Very stressful time for her.  She had a nephew who overdosed but has survived.  She has had a couple other family deaths.  Tremendous job stress.  She feels that she has decreased immunity right now because of her increased stressors.  She does have some difficulty sleeping.  Currently on Klonopin at night which she has been on for several years.  She also is on Effexor Exar 150 mg daily and has been on this for past few years.  She had sent message yesterday requesting additional anxiety medication.  Went to the ER on the 27th with increased headache and body aches and fatigue issues.  Suspected Covid.  She was exposed to someone for 2 hours on Saturday who subsequently tested positive on Monday.  Gabriela Shepard went for test at [facility] on Monday and this came back negative.  There is still clinical suspicion that she could have Covid.  She  has headaches, diffuse body aches, mild cough, and sore throat.  She has had some intermittent chills and sweats.  No nausea, vomiting, or diarrhea.  No dyspnea.  She had several labs in the ER including urinalysis, CBC, CMP, and lipase.  These were all normal with exception of serum sodium 123.  She has been low in the past but never this low.  She has been keeping down some Gatorade recently.  She is on at least 2 medications that could be affiliated with hyponatremia including trazodone and Effexor.  She does not take the trazodone regularly.  She has gabapentin on her med list but does not take this.  She states the trazodone has really not been very effective for her insomnia anyway.  She feels somewhat more settled today.  She was inquiring about additional anxiety medications but does not wish to pursue at this time  Past Medical History:  Diagnosis Date  . Arthritis   . Bowel habit changes    been going on couple years  . Depression   . Flatulence    excessive with strong odor/uncontrollable  . Headache   . 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 History of UTI   . Migraines   . PONV (postoperative nausea and vomiting)   . PTSD (post-traumatic stress disorder)   . STD (sexually transmitted disease)   . Substance abuse ([place])   . Urine incontinence    Past Surgical History:  Procedure Laterality Date  . BREAST BIOPSY  Left    2017 benign x 2  . CESAREAN SECTION  1992  . COLONOSCOPY    . INCONTINENCE SURGERY    . LAPAROSCOPY     under bladder  . NECK SURGERY  02/24/2018   ACDF  . TOTAL HIP ARTHROPLASTY     left  . TOTAL KNEE ARTHROPLASTY Left 12/23/2015   Procedure: TOTAL KNEE ARTHROPLASTY;  Surgeon: Melrose Nakayama, MD;  Location: [place];  Service: Orthopedics;  Laterality: Left;    reports that she quit smoking about 8 years ago. Her smoking use included cigarettes. She has a 5.00 pack-year smoking history. She has never used smokeless tobacco. She reports  that she does not drink alcohol or use drugs. family history includes Alcohol abuse in an other family member; Arthritis in an other family member; Drug abuse in her sister; Hypertension in her maternal grandmother, mother, and another family member; Kidney cancer in her father; Lung cancer in her mother; Mental illness in an other family member; Stomach cancer in her maternal uncle; Ulcers in her father. Allergies  Allergen Reactions  . Sulfa Antibiotics Hives      Observations/Objective: Patient sounds cheerful and well on the phone. I do not appreciate any SOB. Speech and thought processing are grossly intact. Patient reported vitals:  Assessment and Plan:  #1 increased stress and anxiety symptoms.  Somewhat improved today. -Continue Klonopin at night -She is currently out of work because of quarantine for possible Covid and she knows this may be a chance to get some rest get away from some of the acute stressors  #2 Covid type symptoms with headache, body ache, sore throat, chills.  Recent Covid screen was negative. -Gabriela Shepard knows to stay quarantined for minimum of 10 days from onset of symptoms.  She is undecided whether she will get repeat testing -touch base for increased dyspnea or other concerns.  #3 fairly severe hyponatremia.  Possibly related to medications such as trazodone and Effexor but recent poor intake -We discussed the importance of avoiding excessive free water.  She will continue with some Gatorade and things like saltine crackers.  We discussed importance of gradually replacing-not rapidly. -Leave off trazodone -Repeat basic metabolic panel by next week and if not increasing at that point may need to consider getting off Effexor and working up further  Follow Up Instructions:  -As above   99441 5-10 [id number] 11-20 [id number] 21-30 I did not refer this patient for an OV in the next 24 hours for this/these issue(s).  I discussed the assessment and treatment plan with  the patient. The patient was provided an opportunity to ask questions and all were answered. The patient agreed with the plan and demonstrated an understanding of the instructions.   The patient was advised to call back or seek an in-person evaluation if the symptoms worsen or if the condition fails to improve as anticipated.  I provided 25 minutes of non-face-to-face time during this encounter.   Carolann Littler, MD

## 2019-04-06 ENCOUNTER — Encounter: Payer: BC Managed Care – PPO | Admitting: Family Medicine

## 2019-04-10 DIAGNOSIS — F439 Reaction to severe stress, unspecified: Secondary | ICD-10-CM | POA: Diagnosis not present

## 2019-04-11 ENCOUNTER — Telehealth (INDEPENDENT_AMBULATORY_CARE_PROVIDER_SITE_OTHER): Payer: BC Managed Care – PPO | Admitting: Family Medicine

## 2019-04-11 ENCOUNTER — Other Ambulatory Visit: Payer: Self-pay

## 2019-04-11 ENCOUNTER — Encounter: Payer: Self-pay | Admitting: Family Medicine

## 2019-04-11 DIAGNOSIS — R197 Diarrhea, unspecified: Secondary | ICD-10-CM

## 2019-04-11 DIAGNOSIS — N760 Acute vaginitis: Secondary | ICD-10-CM

## 2019-04-11 DIAGNOSIS — M2550 Pain in unspecified joint: Secondary | ICD-10-CM

## 2019-04-11 DIAGNOSIS — R070 Pain in throat: Secondary | ICD-10-CM

## 2019-04-11 DIAGNOSIS — R6889 Other general symptoms and signs: Secondary | ICD-10-CM

## 2019-04-11 DIAGNOSIS — B9689 Other specified bacterial agents as the cause of diseases classified elsewhere: Secondary | ICD-10-CM

## 2019-04-11 DIAGNOSIS — R519 Headache, unspecified: Secondary | ICD-10-CM | POA: Diagnosis not present

## 2019-04-11 DIAGNOSIS — N3010 Interstitial cystitis (chronic) without hematuria: Secondary | ICD-10-CM

## 2019-04-11 DIAGNOSIS — R05 Cough: Secondary | ICD-10-CM | POA: Diagnosis not present

## 2019-04-11 DIAGNOSIS — R110 Nausea: Secondary | ICD-10-CM

## 2019-04-11 MED ORDER — PHENAZOPYRIDINE HCL 200 MG PO TABS: 200.0000 mg | ORAL_TABLET | Freq: Three times a day (TID) | ORAL | 0 refills | Status: DC

## 2019-04-11 MED ORDER — AZITHROMYCIN 250 MG PO TABS: ORAL_TABLET | ORAL | 0 refills | Status: DC

## 2019-04-11 NOTE — Progress Notes (Signed)
Virtual Visit via Telephone Note  I connected with the patient on 04/11/19 at  4:00 PM EST by telephone and verified that I am speaking with the correct person using two identifiers. We attempted to connect virtually but we had technical difficulties with the audio and video.     I discussed the limitations, risks, security and privacy concerns of performing an evaluation and management service by telephone and the availability of in person appointments. I also discussed with the patient that there may be a patient responsible charge related to this service. The patient expressed understanding and agreed to proceed.  Location patient: home Location provider: work or home office Participants present for the call: patient, provider Patient did not have a visit in the prior 7 days to address this/these issue(s).   History of Present Illness: Here to follow up an ER visit on 04-03-19 and for continuing problems. For the past week she has had headaches, body aches, night sweats, ST, dry coughing, nausea without vomiting, diarrhea, urinary burning and vaginal DC with odor. No SOB or chest pain. No loss of smell or taste. At the ER she had an unremarkable exam except for some tenderness in the RLQ of the abdomen. Her WBC count was normal, and all other labs were remarkable only for a sodium of 123. Her urine was clear. A CXR was clear. A CT of the abdomen and pelvis showed a single tiny gall stone but was otherwise normal. She was told she had interstitial cystitis and bacterial vaginitis. She was given Flagyl, Pyridium, and Zofran. She was not tested for Covid-19. Since then she has remained quarantined at home and she has been drinking lots of Gatorade, she still has all these same symptoms but now the headache is even worse. thisis centered over the forehead and she believes she has a sinus infection. She has been taking Advil Sinus and Imitrex, with mixed results. She did not get the Flagyl filled.     Observations/Objective: Patient sounds cheerful and well on the phone. I do not appreciate any SOB. Speech and thought processing are grossly intact. Patient reported vitals:  Assessment and Plan: It is difficult to come up with a single etiology for all of her symptoms. Certainly she needs to be tested for the Covid virus, and she agreed to go to the [facility] testing site tomorrow morning. She could have a sinusitis, so we will send in a Zpack for her to take. She will continue to drink fluids and follow up as needed.  Alysia Penna, MD   Follow Up Instructions:     [id number] 5-10 [phone] 11-20 [id number] 21-30 I did not refer this patient for an OV in the next 24 hours for this/these issue(s).  I discussed the assessment and treatment plan with the patient. The patient was provided an opportunity to ask questions and all were answered. The patient agreed with the plan and demonstrated an understanding of the instructions.   The patient was advised to call back or seek an in-person evaluation if the symptoms worsen or if the condition fails to improve as anticipated.  I provided 23 minutes of non-face-to-face time during this encounter.   Alysia Penna, MD

## 2019-04-14 ENCOUNTER — Encounter: Payer: Self-pay | Admitting: Family Medicine

## 2019-04-16 ENCOUNTER — Encounter: Payer: Self-pay | Admitting: Family Medicine

## 2019-04-17 ENCOUNTER — Encounter: Payer: BC Managed Care – PPO | Admitting: Family Medicine

## 2019-04-17 ENCOUNTER — Other Ambulatory Visit: Payer: Self-pay

## 2019-04-17 MED ORDER — AZITHROMYCIN 250 MG PO TABS: ORAL_TABLET | ORAL | 0 refills | Status: DC

## 2019-04-24 ENCOUNTER — Encounter: Payer: BC Managed Care – PPO | Admitting: Family Medicine

## 2019-04-24 ENCOUNTER — Other Ambulatory Visit: Payer: Self-pay

## 2019-04-24 ENCOUNTER — Telehealth (INDEPENDENT_AMBULATORY_CARE_PROVIDER_SITE_OTHER): Payer: BC Managed Care – PPO | Admitting: Family Medicine

## 2019-04-24 DIAGNOSIS — R481 Agnosia: Secondary | ICD-10-CM | POA: Diagnosis not present

## 2019-04-24 DIAGNOSIS — E871 Hypo-osmolality and hyponatremia: Secondary | ICD-10-CM | POA: Diagnosis not present

## 2019-04-24 DIAGNOSIS — R5383 Other fatigue: Secondary | ICD-10-CM

## 2019-04-24 DIAGNOSIS — Z20822 Contact with and (suspected) exposure to covid-19: Secondary | ICD-10-CM

## 2019-04-24 DIAGNOSIS — R197 Diarrhea, unspecified: Secondary | ICD-10-CM | POA: Diagnosis not present

## 2019-04-24 NOTE — Progress Notes (Signed)
This visit type was conducted due to national recommendations for restrictions regarding the COVID-19 pandemic in an effort to limit this patient's exposure and mitigate transmission in our community.   Virtual Visit via Telephone Note  I connected with Gabriela Shepard on 04/24/19 at 10:30 AM EST by telephone and verified that I am speaking with the correct person using two identifiers.   I discussed the limitations, risks, security and privacy concerns of performing an evaluation and management service by telephone and the availability of in person appointments. I also discussed with the patient that there may be a patient responsible charge related to this service. The patient expressed understanding and agreed to proceed.  Location patient: home Location provider: work or home office Participants present for the call: patient, provider Patient did not have a visit in the prior 7 days to address this/these issue(s).   History of Present Illness: Gabriela Shepard was initially scheduled for physical this morning.  However, when they went over screening she mentioned that starting last Thursday she had profound fatigue and the by Friday developed some diarrhea along with loss of taste and smell.  She has also had some intermittent abdominal cramps.  No nausea or vomiting.  She is using Pepto-Bismol and had some leftover hyoscyamine  which she thought helped with her diarrhea.  No fever.  She had profound fatigue and some mild aches.  She actually had [facility] back [facility] which came back negative.  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  She had gone to the ER recently on the 27th and had labs significant for sodium 123.  She has been consuming more Gatorade and sodium sources since then.  Still feels very draggy.  No thiazides or other medications that should drop sodium other than Effexor which she has been on  for quite some time.  Past Medical History:  Diagnosis Date  . Arthritis   . Bowel habit changes    been going on couple years  . Depression   . Flatulence    excessive with strong odor/uncontrollable  . Headache   . 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 History of UTI   . Migraines   . PONV (postoperative nausea and vomiting)   . PTSD (post-traumatic stress disorder)   . STD (sexually transmitted disease)   . Substance abuse ([place])   . Urine incontinence    Past Surgical History:  Procedure Laterality Date  . BREAST BIOPSY Left    2017 benign x 2  . CESAREAN SECTION  1992  . COLONOSCOPY    . INCONTINENCE SURGERY    . LAPAROSCOPY     under bladder  . NECK SURGERY  02/24/2018   ACDF  . TOTAL HIP ARTHROPLASTY     left  . TOTAL KNEE ARTHROPLASTY Left 12/23/2015   Procedure: TOTAL KNEE ARTHROPLASTY;  Surgeon: Melrose Nakayama, MD;  Location: [place];  Service: Orthopedics;  Laterality: Left;    reports that she quit smoking about 8 years ago. Her smoking use included cigarettes. She has a 5.00 pack-year smoking history. She has never used smokeless tobacco. She reports that she does not drink alcohol or use drugs. family history includes Alcohol abuse in an other family member; Arthritis in an other family member; Drug abuse in her sister; Hypertension in her maternal grandmother, mother, and another family member; Kidney  cancer in her father; Lung cancer in her mother; Mental illness in an other family member; Stomach cancer in her maternal uncle; Ulcers in her father. Allergies  Allergen Reactions  . Sulfa Antibiotics Hives      Observations/Objective: Patient sounds cheerful and well on the phone. I do not appreciate any SOB. Speech and thought processing are grossly intact. Patient reported vitals:  Assessment and Plan: #1 patient presents with onset last Thursday profound fatigue followed by new onset diarrhea along with loss of taste and smell.   Moderate clinical suspicion for Covid infection.  -Order placed for COVID-19 testing today -If negative, recommend further lab evaluation with repeat basic metabolic panel, TSH, [id number] and consider repeat urine sodium and osmolality if hyponatremia persists  #2 recent severe hyponatremia.  She has liberalized her sodium intake.  She is on Effexor but no other medications that would likely be lowering her sodium. -Once Covid testing cleared as above need to get follow-up labs with basic metabolic panel  Follow Up Instructions:  -As above   [id number] 5-10 [id number] 11-20 [id number] 21-30 I did not refer this patient for an OV in the next 24 hours for this/these issue(s).  I discussed the assessment and treatment plan with the patient. The patient was provided an opportunity to ask questions and all were answered. The patient agreed with the plan and demonstrated an understanding of the instructions.   The patient was advised to call back or seek an in-person evaluation if the symptoms worsen or if the condition fails to improve as anticipated.  I provided 22 minutes of non-face-to-face time during this encounter.   Carolann Littler, MD

## 2019-04-25 ENCOUNTER — Encounter: Payer: Self-pay | Admitting: Family Medicine

## 2019-04-25 MED ORDER — HYOSCYAMINE SULFATE SL 0.125 MG SL SUBL: SUBLINGUAL_TABLET | SUBLINGUAL | 1 refills | Status: DC

## 2019-04-25 NOTE — Telephone Encounter (Signed)
I have sent the Hyoscyamine rx in for her.

## 2019-04-26 DIAGNOSIS — F489 Nonpsychotic mental disorder, unspecified: Secondary | ICD-10-CM | POA: Diagnosis not present

## 2019-04-26 LAB — NOVEL CORONAVIRUS, NAA: SARS-CoV-2, NAA: NOT DETECTED

## 2019-04-27 ENCOUNTER — Other Ambulatory Visit: Payer: Self-pay

## 2019-04-27 MED ORDER — ONDANSETRON 4 MG PO TBDP: 4.0000 mg | ORAL_TABLET | Freq: Three times a day (TID) | ORAL | 0 refills | Status: DC | PRN

## 2019-04-30 ENCOUNTER — Ambulatory Visit (INDEPENDENT_AMBULATORY_CARE_PROVIDER_SITE_OTHER): Payer: BC Managed Care – PPO | Admitting: Family Medicine

## 2019-04-30 ENCOUNTER — Other Ambulatory Visit: Payer: Self-pay

## 2019-04-30 ENCOUNTER — Encounter: Payer: Self-pay | Admitting: Family Medicine

## 2019-04-30 VITALS — BP 116/62 | HR 79 | Temp 97.4°F | Ht 64.5 in | Wt 148.6 lb

## 2019-04-30 DIAGNOSIS — Z23 Encounter for immunization: Secondary | ICD-10-CM | POA: Diagnosis not present

## 2019-04-30 DIAGNOSIS — Z1159 Encounter for screening for other viral diseases: Secondary | ICD-10-CM | POA: Diagnosis not present

## 2019-04-30 DIAGNOSIS — Z0000 Encounter for general adult medical examination without abnormal findings: Secondary | ICD-10-CM | POA: Diagnosis not present

## 2019-04-30 DIAGNOSIS — E871 Hypo-osmolality and hyponatremia: Secondary | ICD-10-CM | POA: Diagnosis not present

## 2019-04-30 DIAGNOSIS — R5383 Other fatigue: Secondary | ICD-10-CM

## 2019-04-30 LAB — LIPID PANEL
Cholesterol: 239 mg/dL — ABNORMAL HIGH (ref 0–200)
HDL: 65.2 mg/dL (ref >39.00)
LDL Cholesterol: 148 mg/dL — ABNORMAL HIGH (ref 0–99)
NonHDL: 173.42
Total CHOL/HDL Ratio: 4
Triglycerides: 128 mg/dL (ref 0.0–149.0)
VLDL: 25.6 mg/dL (ref 0.0–40.0)

## 2019-04-30 LAB — CBC WITH DIFFERENTIAL/PLATELET
Basophils Absolute: 0.1 10*3/uL (ref 0.0–0.1)
Basophils Relative: 0.7 % (ref 0.0–3.0)
Eosinophils Absolute: 0.1 10*3/uL (ref 0.0–0.7)
Eosinophils Relative: 1.3 % (ref 0.0–5.0)
HCT: 41.3 % (ref 36.0–46.0)
Hemoglobin: 13.9 g/dL (ref 12.0–15.0)
Lymphocytes Relative: 24.6 % (ref 12.0–46.0)
Lymphs Abs: 1.9 10*3/uL (ref 0.7–4.0)
MCHC: 33.6 g/dL (ref 30.0–36.0)
MCV: 87.9 fl (ref 78.0–100.0)
Monocytes Absolute: 0.7 10*3/uL (ref 0.1–1.0)
Monocytes Relative: 9.5 % (ref 3.0–12.0)
Neutro Abs: 5 10*3/uL (ref 1.4–7.7)
Neutrophils Relative %: 63.9 % (ref 43.0–77.0)
Platelets: 359 10*3/uL (ref 150.0–400.0)
RBC: 4.7 Mil/uL (ref 3.87–5.11)
RDW: 13.6 % (ref 11.5–15.5)
WBC: 7.8 10*3/uL (ref 4.0–10.5)

## 2019-04-30 LAB — HEPATIC FUNCTION PANEL
ALT: 14 U/L (ref 0–35)
AST: 17 U/L (ref 0–37)
Albumin: 4.2 g/dL (ref 3.5–5.2)
Alkaline Phosphatase: 53 U/L (ref 39–117)
Bilirubin, Direct: 0 mg/dL (ref 0.0–0.3)
Total Bilirubin: 0.3 mg/dL (ref 0.2–1.2)
Total Protein: 7.2 g/dL (ref 6.0–8.3)

## 2019-04-30 LAB — BASIC METABOLIC PANEL
BUN: 9 mg/dL (ref 6–23)
CO2: 27 mEq/L (ref 19–32)
Calcium: 9.5 mg/dL (ref 8.4–10.5)
Chloride: 94 mEq/L — ABNORMAL LOW (ref 96–112)
Creatinine, Ser: 0.73 mg/dL (ref 0.40–1.20)
GFR: 81.78 mL/min (ref >60.00)
Glucose, Bld: 101 mg/dL — ABNORMAL HIGH (ref 70–99)
Potassium: 4.6 mEq/L (ref 3.5–5.1)
Sodium: 130 mEq/L — ABNORMAL LOW (ref 135–145)

## 2019-04-30 LAB — SARS-COV-2 IGG: SARS-COV-2 IgG: 0.03

## 2019-04-30 LAB — TSH: TSH: 1.48 u[IU]/mL (ref 0.35–4.50)

## 2019-04-30 NOTE — Progress Notes (Signed)
Subjective:     Patient ID: Gabriela Shepard, female   DOB: 1961/04/26, 58 y.o.   MRN: [id number]  HPI Gabriela Shepard is seen for physical exam.  She has been battling with some fatigue issues especially over the past several weeks.  She has had a couple of Covid tests- both October 25 and November 17 both which came back negative.  She has had some intermittent urinary symptoms and saw her gynecologist and urine culture was apparently negative but she had some vaginitis symptoms and was treated for bacterial vaginosis.  She did have a recent severe hyponatremia with sodium 123.  She had not been eating much because of her illness for several days.  She has been drinking a lot of Gatorade and focusing on higher sodium foods since then.  Health maintenance reviewed  -Needs tetanus booster -No history of hepatitis C screening -Has not had mammogram this year but is getting these through GYN. -Repeat colonoscopy due 2021 -Flu vaccine already given -Pap smear up-to-date  Past Medical History:  Diagnosis Date  . Arthritis   . Bowel habit changes    been going on couple years  . Depression   . Flatulence    excessive with strong odor/uncontrollable  . Headache   . 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 History of UTI   . Migraines   . PONV (postoperative nausea and vomiting)   . PTSD (post-traumatic stress disorder)   . STD (sexually transmitted disease)   . Substance abuse ([place])   . Urine incontinence    Past Surgical History:  Procedure Laterality Date  . BREAST BIOPSY Left    2017 benign x 2  . CESAREAN SECTION  1992  . COLONOSCOPY    . INCONTINENCE SURGERY    . LAPAROSCOPY     under bladder  . NECK SURGERY  02/24/2018   ACDF  . TOTAL HIP ARTHROPLASTY     left  . TOTAL KNEE ARTHROPLASTY Left 12/23/2015   Procedure: TOTAL KNEE ARTHROPLASTY;  Surgeon: Gabriela Nakayama, MD;  Location: [place];  Service: Orthopedics;  Laterality: Left;    reports that she quit  smoking about 8 years ago. Her smoking use included cigarettes. She has a 5.00 pack-year smoking history. She has never used smokeless tobacco. She reports that she does not drink alcohol or use drugs. family history includes Alcohol abuse in an other family member; Arthritis in an other family member; Drug abuse in her sister; Hypertension in her maternal grandmother, mother, and another family member; Kidney cancer in her father; Lung cancer in her mother; Mental illness in an other family member; Stomach cancer in her maternal uncle; Ulcers in her father. Allergies  Allergen Reactions  . Sulfa Antibiotics Hives     Review of Systems  Constitutional: Positive for fatigue. Negative for fever and unexpected weight change.  HENT: Positive for sore throat. Negative for sinus pressure and trouble swallowing.   Respiratory: Negative for cough and shortness of breath.   Cardiovascular: Negative for chest pain and palpitations.  Gastrointestinal: Negative for blood in stool, nausea and vomiting.  Skin: Negative for rash.  Neurological: Negative for syncope and weakness.  Hematological: Negative for adenopathy. Does not bruise/bleed easily.       Objective:   Physical Exam Vitals signs reviewed.  Constitutional:      Appearance: Normal appearance.  HENT:     Right Ear: Tympanic membrane normal.     Left Ear: Tympanic  membrane normal.     Mouth/Throat:     Mouth: Mucous membranes are moist.     Pharynx: Oropharynx is clear. No oropharyngeal exudate or posterior oropharyngeal erythema.  Neck:     Musculoskeletal: Neck supple.  Cardiovascular:     Rate and Rhythm: Normal rate and regular rhythm.  Pulmonary:     Effort: Pulmonary effort is normal.     Breath sounds: Normal breath sounds. No wheezing or rales.  Abdominal:     Comments: Mild tenderness left upper quadrant.  No right upper quadrant tenderness.  No guarding or rebound.  No splenomegaly or hepatomegaly.  Musculoskeletal:      Right lower leg: No edema.     Left lower leg: No edema.  Lymphadenopathy:     Cervical: No cervical adenopathy.  Neurological:     General: No focal deficit present.     Mental Status: She is alert.  Psychiatric:        Mood and Affect: Mood normal.        Thought Content: Thought content normal.        Assessment:     Physical exam.  Patient has had several weeks now of feeling poorly with predominantly fatigue with a couple of Covid test that have been negative.  She did have severe hyponatremia sodium 123 probably related to poor intake    Plan:     -Recheck labs including basic chemistries -If hyponatremia persists with good dietary intake check further studies with urine sodium and osmolality and consider early morning cortisol levels -Tetanus booster given -Check hepatitis C antibody -We discussed possible SARS IgG antibody testing and she would like to pursue that.  She still has some questions whether she is having post infectious fatigue from Covid even though 2 recent antigen tests have been negative.  Eulas Post MD [facility]

## 2019-04-30 NOTE — Addendum Note (Signed)
Addended by: Eulas Post on: 04/30/2019 05:11 PM   Modules accepted: Orders

## 2019-04-30 NOTE — Patient Instructions (Signed)
Preventive Care 40-58 Years Old, Female Preventive care refers to visits with your health care provider and lifestyle choices that can promote health and wellness. This includes:  A yearly physical exam. This may also be called an annual well check.  Regular dental visits and eye exams.  Immunizations.  Screening for certain conditions.  Healthy lifestyle choices, such as eating a healthy diet, getting regular exercise, not using drugs or products that contain nicotine and tobacco, and limiting alcohol use. What can I expect for my preventive care visit? Physical exam Your health care provider will check your:  Height and weight. This may be used to calculate body mass index (BMI), which tells if you are at a healthy weight.  Heart rate and blood pressure.  Skin for abnormal spots. Counseling Your health care provider may ask you questions about your:  Alcohol, tobacco, and drug use.  Emotional well-being.  Home and relationship well-being.  Sexual activity.  Eating habits.  Work and work environment.  Method of birth control.  Menstrual cycle.  Pregnancy history. What immunizations do I need?  Influenza (flu) vaccine  This is recommended every year. Tetanus, diphtheria, and pertussis (Tdap) vaccine  You may need a Td booster every 10 years. Varicella (chickenpox) vaccine  You may need this if you have not been vaccinated. Zoster (shingles) vaccine  You may need this after age 60. Measles, mumps, and rubella (MMR) vaccine  You may need at least one dose of MMR if you were born in 1957 or later. You may also need a second dose. Pneumococcal conjugate (PCV13) vaccine  You may need this if you have certain conditions and were not previously vaccinated. Pneumococcal polysaccharide (PPSV23) vaccine  You may need one or two doses if you smoke cigarettes or if you have certain conditions. Meningococcal conjugate (MenACWY) vaccine  You may need this if you  have certain conditions. Hepatitis A vaccine  You may need this if you have certain conditions or if you travel or work in places where you may be exposed to hepatitis A. Hepatitis B vaccine  You may need this if you have certain conditions or if you travel or work in places where you may be exposed to hepatitis B. Haemophilus influenzae type b (Hib) vaccine  You may need this if you have certain conditions. Human papillomavirus (HPV) vaccine  If recommended by your health care provider, you may need three doses over 6 months. You may receive vaccines as individual doses or as more than one vaccine together in one shot (combination vaccines). Talk with your health care provider about the risks and benefits of combination vaccines. What tests do I need? Blood tests  Lipid and cholesterol levels. These may be checked every 5 years, or more frequently if you are over 58 years old.  Hepatitis C test.  Hepatitis B test. Screening  Lung cancer screening. You may have this screening every year starting at age 58 if you have a 30-pack-year history of smoking and currently smoke or have quit within the past 15 years.  Colorectal cancer screening. All adults should have this screening starting at age 58 and continuing until age 58. Your health care provider may recommend screening at age 45 if you are at increased risk. You will have tests every 1-10 years, depending on your results and the type of screening test.  Diabetes screening. This is done by checking your blood sugar (glucose) after you have not eaten for a while (fasting). You may have this   done every 1-3 years.  Mammogram. This may be done every 1-2 years. Talk with your health care provider about when you should start having regular mammograms. This may depend on whether you have a family history of breast cancer.  BRCA-related cancer screening. This may be done if you have a family history of breast, ovarian, tubal, or peritoneal  cancers.  Pelvic exam and Pap test. This may be done every 3 years starting at age 21. Starting at age 58, this may be done every 5 years if you have a Pap test in combination with an HPV test. Other tests  Sexually transmitted disease (STD) testing.  Bone density scan. This is done to screen for osteoporosis. You may have this scan if you are at high risk for osteoporosis. Follow these instructions at home: Eating and drinking  Eat a diet that includes fresh fruits and vegetables, whole grains, lean protein, and low-fat dairy.  Take vitamin and mineral supplements as recommended by your health care provider.  Do not drink alcohol if: ? Your health care provider tells you not to drink. ? You are pregnant, may be pregnant, or are planning to become pregnant.  If you drink alcohol: ? Limit how much you have to 0-1 drink a day. ? Be aware of how much alcohol is in your drink. In the [place] one drink equals one 12 oz bottle of beer (355 mL), one 5 oz glass of wine (148 mL), or one 1 oz glass of hard liquor (44 mL). Lifestyle  Take daily care of your teeth and gums.  Stay active. Exercise for at least 30 minutes on 5 or more days each week.  Do not use any products that contain nicotine or tobacco, such as cigarettes, e-cigarettes, and chewing tobacco. If you need help quitting, ask your health care provider.  If you are sexually active, practice safe sex. Use a condom or other form of birth control (contraception) in order to prevent pregnancy and STIs (sexually transmitted infections).  If told by your health care provider, take low-dose aspirin daily starting at age 58. What's next?  Visit your health care provider once a year for a well check visit.  Ask your health care provider how often you should have your eyes and teeth checked.  Stay up to date on all vaccines. This information is not intended to replace advice given to you by your health care provider. Make sure you  discuss any questions you have with your health care provider. Document Released: 06/20/2015 Document Revised: 02/02/2018 Document Reviewed: 02/02/2018 Elsevier Patient Education  2020 [employer]  

## 2019-05-01 ENCOUNTER — Encounter: Payer: Self-pay | Admitting: Family Medicine

## 2019-05-01 LAB — HEPATITIS C ANTIBODY
Hepatitis C Ab: NONREACTIVE
SIGNAL TO CUT-OFF: 0.02 (ref <1.00)

## 2019-05-08 DIAGNOSIS — H00022 Hordeolum internum right lower eyelid: Secondary | ICD-10-CM | POA: Diagnosis not present

## 2019-05-08 DIAGNOSIS — H2513 Age-related nuclear cataract, bilateral: Secondary | ICD-10-CM | POA: Diagnosis not present

## 2019-05-08 DIAGNOSIS — H35372 Puckering of macula, left eye: Secondary | ICD-10-CM | POA: Diagnosis not present

## 2019-05-23 ENCOUNTER — Encounter: Payer: Self-pay | Admitting: Family Medicine

## 2019-05-24 ENCOUNTER — Other Ambulatory Visit: Payer: Self-pay | Admitting: Family Medicine

## 2019-05-24 ENCOUNTER — Other Ambulatory Visit: Payer: Self-pay

## 2019-05-24 ENCOUNTER — Ambulatory Visit
Admission: RE | Admit: 2019-05-24 | Discharge: 2019-05-24 | Disposition: A | Discharge status: Home / Self Care | Payer: BC Managed Care – PPO | Source: Ambulatory Visit | Attending: Family Medicine | Admitting: Family Medicine

## 2019-05-24 DIAGNOSIS — Z1231 Encounter for screening mammogram for malignant neoplasm of breast: Secondary | ICD-10-CM | POA: Diagnosis not present

## 2019-05-24 DIAGNOSIS — F4310 Post-traumatic stress disorder, unspecified: Secondary | ICD-10-CM | POA: Diagnosis not present

## 2019-05-25 DIAGNOSIS — M25551 Pain in right hip: Secondary | ICD-10-CM | POA: Diagnosis not present

## 2019-05-25 NOTE — Telephone Encounter (Signed)
Last ov:04/30/2019 Last filled:03/07/2019

## 2019-05-30 DIAGNOSIS — F439 Reaction to severe stress, unspecified: Secondary | ICD-10-CM | POA: Diagnosis not present

## 2019-06-04 DIAGNOSIS — F439 Reaction to severe stress, unspecified: Secondary | ICD-10-CM | POA: Diagnosis not present
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
## 2019-06-05 ENCOUNTER — Other Ambulatory Visit: Payer: Self-pay

## 2019-06-05 ENCOUNTER — Encounter: Payer: Self-pay | Admitting: Obstetrics and Gynecology

## 2019-06-05 ENCOUNTER — Ambulatory Visit (INDEPENDENT_AMBULATORY_CARE_PROVIDER_SITE_OTHER): Payer: BC Managed Care – PPO | Admitting: Obstetrics and Gynecology

## 2019-06-05 VITALS — BP 106/70 | HR 88 | Temp 97.9°F | Ht 64.57 in | Wt 153.2 lb

## 2019-06-05 DIAGNOSIS — F418 Other specified anxiety disorders: Secondary | ICD-10-CM | POA: Diagnosis not present

## 2019-06-05 DIAGNOSIS — N898 Other specified noninflammatory disorders of vagina: Secondary | ICD-10-CM | POA: Diagnosis not present

## 2019-06-05 DIAGNOSIS — E559 Vitamin D deficiency, unspecified: Secondary | ICD-10-CM | POA: Diagnosis not present

## 2019-06-05 DIAGNOSIS — Z01419 Encounter for gynecological examination (general) (routine) without abnormal findings: Secondary | ICD-10-CM

## 2019-06-05 DIAGNOSIS — M810 Age-related osteoporosis without current pathological fracture: Secondary | ICD-10-CM

## 2019-06-05 MED ORDER — ESTRADIOL 10 MCG VA TABS: ORAL_TABLET | VAGINAL | 3 refills | Status: DC

## 2019-06-05 MED ORDER — ALENDRONATE SODIUM 70 MG PO TABS: 70.0000 mg | ORAL_TABLET | ORAL | 3 refills | Status: DC

## 2019-06-05 MED ORDER — VENLAFAXINE HCL ER 150 MG PO CP24: 150.0000 mg | ORAL_CAPSULE | Freq: Every day | ORAL | 3 refills | Status: DC

## 2019-06-05 MED ORDER — VALACYCLOVIR HCL 500 MG PO TABS: ORAL_TABLET | ORAL | 1 refills | Status: DC

## 2019-06-05 NOTE — Patient Instructions (Addendum)
EXERCISE AND DIET:  We recommended that you start or continue a regular exercise program for good health. Regular exercise means any activity that makes your heart beat faster and makes you sweat.  We recommend exercising at least 30 minutes per day at least 3 days a week, preferably 4 or 5.  We also recommend a diet low in fat and sugar.  Inactivity, poor dietary choices and obesity can cause diabetes, heart attack, stroke, and kidney damage, among others.    ALCOHOL AND SMOKING:  Women should limit their alcohol intake to no more than 7 drinks/beers/glasses of wine (combined, not each!) per week. Moderation of alcohol intake to this level decreases your risk of breast cancer and liver damage. And of course, no recreational drugs are part of a healthy lifestyle.  And absolutely no smoking or even second hand smoke. Most people know smoking can cause heart and lung diseases, but did you know it also contributes to weakening of your bones? Aging of your skin?  Yellowing of your teeth and nails?  CALCIUM AND VITAMIN D:  Adequate intake of calcium and Vitamin D are recommended.  The recommendations for exact amounts of these supplements seem to change often, but generally speaking 1,200 mg of calcium (between diet and supplement) and 800 units of Vitamin D per day seems prudent. Certain women may benefit from higher intake of Vitamin D.  If you are among these women, your doctor will have told you during your visit.    PAP SMEARS:  Pap smears, to check for cervical cancer or precancers,  have traditionally been done yearly, although recent scientific advances have shown that most women can have pap smears less often.  However, every woman still should have a physical exam from her gynecologist every year. It will include a breast check, inspection of the vulva and vagina to check for abnormal growths or skin changes, a visual exam of the cervix, and then an exam to evaluate the size and shape of the uterus and  ovaries.  And after 58 years of age, a rectal exam is indicated to check for rectal cancers. We will also provide age appropriate advice regarding health maintenance, like when you should have certain vaccines, screening for sexually transmitted diseases, bone density testing, colonoscopy, mammograms, etc.   MAMMOGRAMS:  All women over 89 years old should have a yearly mammogram. Many facilities now offer a "3D" mammogram, which may cost around $50 extra out of pocket. If possible,  we recommend you accept the option to have the 3D mammogram performed.  It both reduces the number of women who will be called back for extra views which then turn out to be normal, and it is better than the routine mammogram at detecting truly abnormal areas.    COLON CANCER SCREENING: Now recommend starting at age 58. At this time colonoscopy is not covered for routine screening until 50. There are take home tests that can be done between 45-49.   COLONOSCOPY:  Colonoscopy to screen for colon cancer is recommended for all women at age 31.  We know, you hate the idea of the prep.  We agree, BUT, having colon cancer and not knowing it is worse!!  Colon cancer so often starts as a polyp that can be seen and removed at colonscopy, which can quite literally save your life!  And if your first colonoscopy is normal and you have no family history of colon cancer, most women don't have to have it again for  10 years.  Once every ten years, you can do something that may end up saving your life, right?  We will be happy to help you get it scheduled when you are ready.  Be sure to check your insurance coverage so you understand how much it will cost.  It may be covered as a preventative service at no cost, but you should check your particular policy.      Breast Self-Awareness Breast self-awareness means being familiar with how your breasts look and feel. It involves checking your breasts regularly and reporting any changes to your  health care provider. Practicing breast self-awareness is important. A change in your breasts can be a sign of a serious medical problem. Being familiar with how your breasts look and feel allows you to find any problems early, when treatment is more likely to be successful. All women should practice breast self-awareness, including women who have had breast implants. How to do a breast self-exam One way to learn what is normal for your breasts and whether your breasts are changing is to do a breast self-exam. To do a breast self-exam: Look for Changes  1. Remove all the clothing above your waist. 2. Stand in front of a mirror in a room with good lighting. 3. Put your hands on your hips. 4. Push your hands firmly downward. 5. Compare your breasts in the mirror. Look for differences between them (asymmetry), such as: ? Differences in shape. ? Differences in size. ? Puckers, dips, and bumps in one breast and not the other. 6. Look at each breast for changes in your skin, such as: ? Redness. ? Scaly areas. 7. Look for changes in your nipples, such as: ? Discharge. ? Bleeding. ? Dimpling. ? Redness. ? A change in position. Feel for Changes Carefully feel your breasts for lumps and changes. It is best to do this while lying on your back on the floor and again while sitting or standing in the shower or tub with soapy water on your skin. Feel each breast in the following way:  Place the arm on the side of the breast you are examining above your head.  Feel your breast with the other hand.  Start in the nipple area and make  inch (2 cm) overlapping circles to feel your breast. Use the pads of your three middle fingers to do this. Apply light pressure, then medium pressure, then firm pressure. The light pressure will allow you to feel the tissue closest to the skin. The medium pressure will allow you to feel the tissue that is a little deeper. The firm pressure will allow you to feel the tissue  close to the ribs.  Continue the overlapping circles, moving downward over the breast until you feel your ribs below your breast.  Move one finger-width toward the center of the body. Continue to use the  inch (2 cm) overlapping circles to feel your breast as you move slowly up toward your collarbone.  Continue the up and down exam using all three pressures until you reach your armpit.  Write Down What You Find  Write down what is normal for each breast and any changes that you find. Keep a written record with breast changes or normal findings for each breast. By writing this information down, you do not need to depend only on memory for size, tenderness, or location. Write down where you are in your menstrual cycle, if you are still menstruating. If you are having trouble noticing differences  in your breasts, do not get discouraged. With time you will become more familiar with the variations in your breasts and more comfortable with the exam. How often should I examine my breasts? Examine your breasts every month. If you are breastfeeding, the best time to examine your breasts is after a feeding or after using a breast pump. If you menstruate, the best time to examine your breasts is 5-7 days after your period is over. During your period, your breasts are lumpier, and it may be more difficult to notice changes. When should I see my health care provider? See your health care provider if you notice:  A change in shape or size of your breasts or nipples.  A change in the skin of your breast or nipples, such as a reddened or scaly area.  Unusual discharge from your nipples.  A lump or thick area that was not there before.  Pain in your breasts.  Anything that concerns you.  Osteoporosis  Osteoporosis is thinning and loss of density in your bones. Osteoporosis makes bones more brittle and fragile and more likely to break (fracture). Over time, osteoporosis can cause your bones to become  so weak that they fracture after a minor fall. Bones in the hip, wrist, and spine are most likely to fracture due to osteoporosis. What are the causes? The exact cause of this condition is not known. What increases the risk? You may be at greater risk for osteoporosis if you:  Have a family history of the condition.  Have poor nutrition.  Use steroid medicines, such as prednisone.  Are female.  Are age 90 or older.  Smoke or have a history of smoking.  Are not physically active (are sedentary).  Are white (Caucasian) or of Asian descent.  Have a small body frame.  Take certain medicines, such as antiseizure medicines. What are the signs or symptoms? A fracture might be the first sign of osteoporosis, especially if the fracture results from a fall or injury that usually would not cause a bone to break. Other signs and symptoms include:  Pain in the neck or low back.  Stooped posture.  Loss of height. How is this diagnosed? This condition may be diagnosed based on:  Your medical history.  A physical exam.  A bone mineral density test, also called a DXA or DEXA test (dual-energy X-ray absorptiometry test). This test uses X-rays to measure the amount of minerals in your bones. How is this treated? The goal of treatment is to strengthen your bones and lower your risk for a fracture. Treatment may involve:  Making lifestyle changes, such as: ? Including foods with more calcium and vitamin D in your diet. ? Doing weight-bearing and muscle-strengthening exercises. ? Stopping tobacco use. ? Limiting alcohol intake.  Taking medicine to slow the process of bone loss or to increase bone density.  Taking daily supplements of calcium and vitamin D.  Taking hormone replacement medicines, such as estrogen for women and testosterone for men.  Monitoring your levels of calcium and vitamin D. Follow these instructions at home:  Activity  Exercise as told by your health care  provider. Ask your health care provider what exercises and activities are safe for you. You should do: ? Exercises that make you work against gravity (weight-bearing exercises), such as tai chi, yoga, or walking. ? Exercises to strengthen muscles, such as lifting weights. Lifestyle  Limit alcohol intake to no more than 1 drink a day for nonpregnant women and 2  drinks a day for men. One drink equals 12 oz of beer, 5 oz of wine, or 1 oz of hard liquor.  Do not use any products that contain nicotine or tobacco, such as cigarettes and e-cigarettes. If you need help quitting, ask your health care provider. Preventing falls  Use devices to help you move around (mobility aids) as needed, such as canes, walkers, scooters, or crutches.  Keep rooms well-lit and clutter-free.  Remove tripping hazards from walkways, including cords and throw rugs.  Install grab bars in bathrooms and safety rails on stairs.  Use rubber mats in the bathroom and other areas that are often wet or slippery.  Wear closed-toe shoes that fit well and support your feet. Wear shoes that have rubber soles or low heels.  Review your medicines with your health care provider. Some medicines can cause dizziness or changes in blood pressure, which can increase your risk of falling. General instructions  Include calcium and vitamin D in your diet. Calcium is important for bone health, and vitamin D helps your body to absorb calcium. Good sources of calcium and vitamin D include: ? Certain fatty fish, such as salmon and tuna. ? Products that have calcium and vitamin D added to them (fortified products), such as fortified cereals. ? Egg yolks. ? Cheese. ? Liver.  Take over-the-counter and prescription medicines only as told by your health care provider.  Keep all follow-up visits as told by your health care provider. This is important. Contact a health care provider if:  You have never been screened for osteoporosis and you  are: ? A woman who is age 93 or older. ? A man who is age 65 or older. Get help right away if:  You fall or injure yourself. Summary  Osteoporosis is thinning and loss of density in your bones. This makes bones more brittle and fragile and more likely to break (fracture),even with minor falls.  The goal of treatment is to strengthen your bones and reduce your risk for a fracture.  Include calcium and vitamin D in your diet. Calcium is important for bone health, and vitamin D helps your body to absorb calcium.  Talk with your health care provider about screening for osteoporosis if you are a woman who is age 54 or older, or a man who is age 38 or older. This information is not intended to replace advice given to you by your health care provider. Make sure you discuss any questions you have with your health care provider. Document Released: 03/03/2005 Document Revised: 05/06/2017 Document Reviewed: 03/18/2017 Elsevier Patient Education  2020 [employer].

## 2019-06-06 ENCOUNTER — Telehealth (INDEPENDENT_AMBULATORY_CARE_PROVIDER_SITE_OTHER): Payer: BC Managed Care – PPO | Admitting: Family Medicine

## 2019-06-06 ENCOUNTER — Ambulatory Visit: Payer: BC Managed Care – PPO | Admitting: Obstetrics and Gynecology

## 2019-06-06 DIAGNOSIS — F329 Major depressive disorder, single episode, unspecified: Secondary | ICD-10-CM | POA: Diagnosis not present

## 2019-06-06 DIAGNOSIS — J0111 Acute recurrent frontal sinusitis: Secondary | ICD-10-CM | POA: Diagnosis not present

## 2019-06-06 DIAGNOSIS — E559 Vitamin D deficiency, unspecified: Secondary | ICD-10-CM | POA: Diagnosis not present

## 2019-06-06 DIAGNOSIS — H00015 Hordeolum externum left lower eyelid: Secondary | ICD-10-CM

## 2019-06-06 DIAGNOSIS — F32A Depression, unspecified: Secondary | ICD-10-CM

## 2019-06-06 LAB — VITAMIN D 25 HYDROXY (VIT D DEFICIENCY, FRACTURES): Vit D, 25-Hydroxy: 27 ng/mL — ABNORMAL LOW (ref 30.0–100.0)

## 2019-06-06 MED ORDER — VENLAFAXINE HCL ER 75 MG PO CP24: 75.0000 mg | ORAL_CAPSULE | Freq: Every day | ORAL | 2 refills | Status: DC

## 2019-06-06 MED ORDER — DOXYCYCLINE HYCLATE 100 MG PO CAPS: 100.0000 mg | ORAL_CAPSULE | Freq: Two times a day (BID) | ORAL | 0 refills | Status: DC

## 2019-06-06 MED ORDER — VITAMIN D3 1.25 MG (50000 UT) PO CAPS: 1.0000 | ORAL_CAPSULE | ORAL | 3 refills | Status: DC

## 2019-06-06 NOTE — Progress Notes (Signed)
This visit type was conducted due to national recommendations for restrictions regarding the COVID-19 pandemic in an effort to limit this patient's exposure and mitigate transmission in our community.   Virtual Visit via Video Note  I connected with Gabriela Shepard on 06/06/19 at 11:45 AM EST by a video enabled telemedicine application and verified that I am speaking with the correct person using two identifiers.  Location patient: home Location provider:work or home office Persons participating in the virtual visit: patient, provider  I discussed the limitations of evaluation and management by telemedicine and the availability of in person appointments. The patient expressed understanding and agreed to proceed.   HPI: Michae had called regarding several items.  First she has stye left lower lid.  First noted today when she woke up.  She has had frequent styes in the past.  She has some mild irritation.  No eye drainage.  No blurred vision.  No vesicular rash.  Second issue is 2-week history of progressive sinus congestion and frontal sinus headaches.  She has tried multiple things including vaporizer, Nettie pot, Advil sinus all with temporary relief.  She denies any fever.  She is worried this is becoming more of an infection issue.  Thick mucus.  No bloody nasal discharge.  Third item is that she has history of recurrent depression.  Has been on Effexor XR 150 mg for quite some time.  She would like to consider trial of tapering off.  She is aware that this medication has to be tapered off gradually  Recent low vitamin D per GYN with level 27.  She is taken 50,000 international unit dose previously of vitamin D and is requesting refills.   ROS: See pertinent positives and negatives per HPI.  Past Medical History:  Diagnosis Date  . Arthritis   . Bowel habit changes    been going on couple years  . Depression   . Flatulence    excessive with strong odor/uncontrollable  . Headache   .  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 History of UTI   . Migraines   . PONV (postoperative nausea and vomiting)   . PTSD (post-traumatic stress disorder)   . STD (sexually transmitted disease)   . Substance abuse ([place])   . Urine incontinence     Past Surgical History:  Procedure Laterality Date  . BREAST BIOPSY Left    2017 benign x 2  . CESAREAN SECTION  1992  . COLONOSCOPY    . INCONTINENCE SURGERY    . LAPAROSCOPY     under bladder  . NECK SURGERY  02/24/2018   ACDF  . TOTAL HIP ARTHROPLASTY     left  . TOTAL KNEE ARTHROPLASTY Left 12/23/2015   Procedure: TOTAL KNEE ARTHROPLASTY;  Surgeon: Melrose Nakayama, MD;  Location: [facility];  Service: Orthopedics;  Laterality: Left;    Family History  Problem Relation Age of Onset  . Hypertension Mother   . Lung cancer Mother   . Alcohol abuse Other   . Arthritis Other   . Hypertension Other   . Mental illness Other   . Kidney cancer Father   . Ulcers Father   . Drug abuse Sister   . Hypertension Maternal Grandmother   . Stomach cancer Maternal Uncle   . Colon cancer Neg Hx   . Esophageal cancer Neg Hx     SOCIAL HX: Non-smoker   Current Outpatient Medications:  .  AIMOVIG 70 MG/ML  SOAJ, Inject 1 mL into the skin every 30 (thirty) days., Disp: , Rfl:  .  alendronate (FOSAMAX) 70 MG tablet, Take 1 tablet (70 mg total) by mouth every 7 (seven) days. Take first thing in am with 6 oz. Water.  Be upright after taking.  Eat nothing for one hour., Disp: 12 tablet, Rfl: 3 .  Calcium Carbonate-Vit D-Min (CALCIUM 1200 PO), Take 2 tablets by mouth daily as needed. , Disp: , Rfl:  .  Cholecalciferol (VITAMIN D3) 1.25 MG (50000 UT) CAPS, Take 1 capsule by mouth once a week., Disp: 12 capsule, Rfl: 3 .  clonazePAM (KLONOPIN) 1 MG tablet, TAKE 1 TABLET BY MOUTH AT BEDTIME EVERY DAY, Disp: 90 tablet, Rfl: 0 .  doxycycline (VIBRAMYCIN) 100 MG capsule, Take 1 capsule (100 mg total) by mouth 2 (two) times daily., Disp: 20  capsule, Rfl: 0 .  Estradiol 10 MCG TABS vaginal tablet, Use one tablet qhs x 1 week, then 2 x a week at hs, Disp: 24 tablet, Rfl: 3 .  fluticasone (FLONASE) 50 MCG/ACT nasal spray, Place 2 sprays daily into both nostrils., Disp: 16 g, Rfl: 11 .  hydrOXYzine (ATARAX/VISTARIL) 10 MG tablet, TAKE 1 TABLET (10 MG TOTAL) BY MOUTH 3 (THREE) TIMES DAILY AS NEEDED FOR ANXIETY., Disp: 270 tablet, Rfl: 1 .  levothyroxine (SYNTHROID) 25 MCG tablet, TAKE 1 TABLET BY MOUTH EVERY DAY BEFORE BREAKFAST, Disp: 90 tablet, Rfl: 0 .  lidocaine (XYLOCAINE) 5 % ointment, APPLY 1 APPLICATION TOPICALLY 4 TIMES DAILY AS NEEDED, Disp: 35.44 g, Rfl: 0 .  MAGNESIUM PO, Take 2 tablets by mouth daily., Disp: , Rfl:  .  montelukast (SINGULAIR) 10 MG tablet, Take 1 tablet (10 mg total) by mouth at bedtime., Disp: 90 tablet, Rfl: 3 .  SUMAtriptan (IMITREX) 100 MG tablet, TAKE 1 TABLET BY MOUTH AS NEEDED FOR HEADACHE, IF HEADACHE PERSISTS OR RECURS REPEAT IN 2 HOURS, Disp: 9 tablet, Rfl: 4 .  tiZANidine (ZANAFLEX) 4 MG capsule, 1 tablet at night, Disp: 30 capsule, Rfl: 0 .  valACYclovir (VALTREX) 500 MG tablet, 1 tab po BID x 3 days prn, Disp: 90 tablet, Rfl: 1 .  venlafaxine XR (EFFEXOR XR) 75 MG 24 hr capsule, Take 1 capsule (75 mg total) by mouth daily with breakfast., Disp: 30 capsule, Rfl: 2  EXAM:  VITALS per patient if applicable:  GENERAL: alert, oriented, appears well and in no acute distress  HEENT: atraumatic, conjunttiva clear, no obvious abnormalities on inspection of external nose and ears  NECK: normal movements of the head and neck  LUNGS: on inspection no signs of respiratory distress, breathing rate appears normal, no obvious gross SOB, gasping or wheezing  CV: no obvious cyanosis  MS: moves all visible extremities without noticeable abnormality  PSYCH/NEURO: pleasant and cooperative, no obvious depression or anxiety, speech and thought processing grossly intact  ASSESSMENT AND PLAN:  Discussed the  following assessment and plan:  #1 stye left lower lid -We explained these are usually self-limited and have recommended warm compresses several times daily and touch base if not resolving over the next couple weeks  #2 probable bilateral frontal sinusitis -Continue conservative measures with an increased hydration, moist heat, Nettie pot irrigation with sterile saline, and Advil -Doxycycline 100 mg twice daily for 10 days  #3 vitamin D deficiency with recent level 27 -Sent a new prescription for vitamin D 50,000 international units once weekly.  Consider repeat vitamin D level in 3 to 4 months  #4 history of recurrent depression  stable.  Patient requesting tapering off Effexor -We discussed the fact that she could have worsening hot flashes if she tapers off. -Also discussed the fact that Effexor can be used for prophylaxis with migraine headaches.  She will need to watch for flareup of those even though her migraine headaches have been greatly improved since taking Aimovig -We agreed to send in dose of Effexor XR 75 mg daily and would recommend at least 3 to 4 weeks on this dose then consider further tapering to 37.5 mg     I discussed the assessment and treatment plan with the patient. The patient was provided an opportunity to ask questions and all were answered. The patient agreed with the plan and demonstrated an understanding of the instructions.   The patient was advised to call back or seek an in-person evaluation if the symptoms worsen or if the condition fails to improve as anticipated.     Carolann Littler, MD

## 2019-06-07 DIAGNOSIS — F4310 Post-traumatic stress disorder, unspecified: Secondary | ICD-10-CM | POA: Diagnosis not present

## 2019-06-07 LAB — BACTERIAL VAGINOSIS, NAA

## 2019-06-09 ENCOUNTER — Other Ambulatory Visit: Payer: Self-pay | Admitting: Family Medicine

## 2019-06-10 ENCOUNTER — Other Ambulatory Visit: Payer: Self-pay | Admitting: Family Medicine

## 2019-06-10 ENCOUNTER — Encounter: Payer: Self-pay | Admitting: Family Medicine

## 2019-06-11 ENCOUNTER — Other Ambulatory Visit: Payer: Self-pay

## 2019-06-11 MED ORDER — DOXYCYCLINE HYCLATE 100 MG PO CAPS: 100.0000 mg | ORAL_CAPSULE | Freq: Two times a day (BID) | ORAL | 0 refills | Status: DC

## 2019-06-12 ENCOUNTER — Encounter: Payer: Self-pay | Admitting: Obstetrics and Gynecology

## 2019-06-13 ENCOUNTER — Telehealth: Payer: Self-pay | Admitting: Obstetrics and Gynecology

## 2019-06-13 DIAGNOSIS — F4310 Post-traumatic stress disorder, unspecified: Secondary | ICD-10-CM | POA: Diagnosis not present

## 2019-06-13 NOTE — Telephone Encounter (Signed)
Spoke back with pt. Pt is willing to try Fosamax again and will take as directed per Dr Gentry Fitz instructions. Pt was taking at work and after having coffee. Instructed to just take with water and wait for 30 mins after to have coffee. Pt agreeable. Pt will call back with any further reactions and if needs referral to endocrinology.  Pt verbalized understanding.   Routing to provider for final review. Patient is agreeable to disposition. Will close encounter.

## 2019-06-13 NOTE — Telephone Encounter (Signed)
Diarrhea isn't a common c/o with Fosamax. I would recommend she try it one more time, but would switch to a day when she is off the next day. If she is still having a reaction we will send her for a consultation with Endocrinology to discuss alternatives. She should take Fosamax first thing in the morning on an empty stomach with an 8 oz glass of water. She should stay up right for at least 30 minutes and not eat for 30 minutes after taking it.

## 2019-06-13 NOTE — Telephone Encounter (Signed)
Patient sent the following correspondence through [facility].  Hi Dr Talbert Nan. I took my first dose of osteo med yesterday and have been horribly sick to my stomach since. I cannot get it to settle down. It's like I have a stomach virus.  Are there any suggestions you may ha e for this or a different alternative that's not going to be so painful. I stayed out of work because I was in bathroom so much.  I'm not sure if your body has to adjust or this is not going to be the right remedy for me. Look forward to hearing back. Gabriela Shepard.

## 2019-06-13 NOTE — Telephone Encounter (Signed)
Spoke to pt. Pt started taking osteo med Monday and started having diarrhea and stomach upset on Tuesday morning and couldn't go to work because couldn't  stay out of the bathroom. Pt wanting to know how to take current med for best results, like on empty stomach or with food? And if this med isnt for her, then is there an alternative medication that she should try? She is open to ideas and options. Will review with  Dr Talbert Nan and return call to pt. Pt agreeable.   Will route to Dr Talbert Nan for review and recommendations.

## 2019-06-19 ENCOUNTER — Telehealth: Payer: Self-pay | Admitting: *Deleted

## 2019-06-19 NOTE — Telephone Encounter (Signed)
Formulary Exception request received from [facility] for yuvafem.   Form completed and faxed to [employer] at [phone].

## 2019-06-21 ENCOUNTER — Telehealth: Payer: Self-pay | Admitting: Obstetrics and Gynecology

## 2019-06-21 ENCOUNTER — Encounter: Payer: Self-pay | Admitting: Obstetrics and Gynecology

## 2019-06-21 DIAGNOSIS — M810 Age-related osteoporosis without current pathological fracture: Secondary | ICD-10-CM

## 2019-06-21 NOTE — Telephone Encounter (Signed)
[facility]  Phone Number: [phone]  The alendromate sodium has now caused me a three day severe stomach pain and constipation. I took as directed since the first time was diarrhea and pain.  I cannot get thru work a full day. I'm going to try a different route as this med is not going to work for me. I'm looking into natural alternatives unless there is a med that is with less side effect and still covered by insurance. Thanks Lattie Haw

## 2019-06-21 NOTE — Telephone Encounter (Signed)
Per review of previous encounter dated 06/13/19, if patient experiences a reaction to Fosamax again, will send to endocrinology to discuss alternatives.   Dr. Talbert Nan -please review and advise.

## 2019-06-21 NOTE — Telephone Encounter (Signed)
Please refer to Endocrinology and let her know. I agree she shouldn't take the fosamax again.

## 2019-06-21 NOTE — Telephone Encounter (Addendum)
Left message to call Sharee Pimple, RN at [facility].   Also see telephone encounter dated 06/19/19 regarding PA for yuvafem.

## 2019-06-21 NOTE — Telephone Encounter (Signed)
Left message to call Sharee Pimple, RN at [facility].    PA approved for Yuvafem 110mch vag tabs Effective 06/21/19 - 06/20/20

## 2019-06-25 NOTE — Telephone Encounter (Signed)
Spoke with patient, advised as seen below per Dr. Talbert Nan. Also review PA for yuvafem, see 06/19/19 telephone encounter.   Patient request referral to [facility] endocrinology, female provider. Order placed for referral to endocrinology, Dr. Cruzita Lederer. Advised patient their office will contact her directly to schedule. Patient verbalizes understanding and is agreeable.   Routing to provider for final review. Patient is agreeable to disposition. Will close encounter.  Cc: [name]

## 2019-06-25 NOTE — Telephone Encounter (Signed)
Spoke with patient, advised as seen below. Patient verbalizes understanding and is agreeable.   Encounter closed.

## 2019-06-26 DIAGNOSIS — F4310 Post-traumatic stress disorder, unspecified: Secondary | ICD-10-CM | POA: Diagnosis not present

## 2019-06-28 ENCOUNTER — Encounter: Payer: Self-pay | Admitting: Family Medicine

## 2019-06-29 ENCOUNTER — Other Ambulatory Visit: Payer: Self-pay | Admitting: Family Medicine

## 2019-06-29 NOTE — Telephone Encounter (Signed)
Refill for 6 months. 

## 2019-07-02 ENCOUNTER — Encounter: Payer: Self-pay | Admitting: Family Medicine

## 2019-07-04 DIAGNOSIS — F4310 Post-traumatic stress disorder, unspecified: Secondary | ICD-10-CM | POA: Diagnosis not present

## 2019-07-11 ENCOUNTER — Telehealth: Payer: Self-pay

## 2019-07-11 NOTE — Telephone Encounter (Signed)
I received approval from [facility] [place] for Aimovig 70 mg from 07/04/2019 through 07/03/2020.  Case # [id number]

## 2019-07-20 DIAGNOSIS — F4310 Post-traumatic stress disorder, unspecified: Secondary | ICD-10-CM | POA: Diagnosis not present

## 2019-08-02 ENCOUNTER — Other Ambulatory Visit: Payer: Self-pay

## 2019-08-06 ENCOUNTER — Encounter: Payer: Self-pay | Admitting: Internal Medicine

## 2019-08-06 ENCOUNTER — Ambulatory Visit: Payer: BC Managed Care – PPO | Admitting: Internal Medicine

## 2019-08-06 ENCOUNTER — Other Ambulatory Visit: Payer: Self-pay

## 2019-08-06 VITALS — BP 130/90 | HR 88 | Ht 64.5 in | Wt 150.0 lb

## 2019-08-06 DIAGNOSIS — E871 Hypo-osmolality and hyponatremia: Secondary | ICD-10-CM | POA: Diagnosis not present

## 2019-08-06 DIAGNOSIS — M810 Age-related osteoporosis without current pathological fracture: Secondary | ICD-10-CM | POA: Diagnosis not present

## 2019-08-06 DIAGNOSIS — E042 Nontoxic multinodular goiter: Secondary | ICD-10-CM | POA: Diagnosis not present

## 2019-08-06 DIAGNOSIS — F4312 Post-traumatic stress disorder, chronic: Secondary | ICD-10-CM | POA: Diagnosis not present

## 2019-08-06 LAB — BASIC METABOLIC PANEL
BUN: 11 mg/dL (ref 6–23)
CO2: 28 mEq/L (ref 19–32)
Calcium: 9.5 mg/dL (ref 8.4–10.5)
Chloride: 92 mEq/L — ABNORMAL LOW (ref 96–112)
Creatinine, Ser: 0.73 mg/dL (ref 0.40–1.20)
GFR: 81.7 mL/min (ref >60.00)
Glucose, Bld: 104 mg/dL — ABNORMAL HIGH (ref 70–99)
Potassium: 4.7 mEq/L (ref 3.5–5.1)
Sodium: 128 mEq/L — ABNORMAL LOW (ref 135–145)

## 2019-08-06 LAB — CORTISOL: Cortisol, Plasma: 11.2 ug/dL

## 2019-08-06 LAB — VITAMIN D 25 HYDROXY (VIT D DEFICIENCY, FRACTURES): VITD: 58.71 ng/mL (ref 30.00–100.00)

## 2019-08-06 NOTE — Progress Notes (Addendum)
Patient ID: Gabriela Shepard, female   DOB: December 18, 1960, 59 y.o.   MRN: [id number]   This visit occurred during the SARS-CoV-2 public health emergency.  Safety protocols were in place, including screening questions prior to the visit, additional usage of staff PPE, and extensive cleaning of exam room while observing appropriate contact time as indicated for disinfecting solutions.   HPI  Gabriela Shepard is a 59 y.o.-year-old female, referred by her OB/GYN doctor, Dr. Talbert Nan, for management of osteoporosis (OP).  Pt was dx with OP in 2019.  I reviewed pt's DXA scans: Date L1-L4 T score FN T score 33% distal Radius FRAX  05/03/2018 (Breast center) L1-L2: -2.6 RFN: -1.4 LFN: n/a (L THR) n/a  10-year major osteoporosis fracture risk: 23%  02/18/2016 ([place]) L1-L4: -1.4 RFN: -1.5 LFN: -1.4 n/a    She has a history of a hand fracture - in her 20's.    + recent falls - 03/2019. At that time, she had hyponatremia and also dizziness/vertigo/orthostasis. No poor vision.   She had to have steroid inj's in neck, SI joint and ischium after her fractures.  She has a h/o L TKR.  OP treatments:  - Fosamax >> heartburn, stomach pain  + h/o vitamin D insuffficiency. Reviewed available vit D levels: Lab Results  Component Value Date   VD25OH 27.0 (L) 06/05/2019   VD25OH 51.96 02/07/2018   VD25OH 40.54 03/18/2017   Pt is on: - calcium in MVI - ~vitamin D3 2000 units daily She is getting D3 im injection and B12 im.  By her integrative med dr.  + recently started weight bearing exercises. Started to walk and hike - restarted - 3-4x a week.  She does not take high vitamin A doses.  Menopause was at [age] y/o. She was on HRT.  FH of osteoporosis: mother.  No h/o persistent hyper/hypocalcemia or hyperparathyroidism. No h/o kidney stones. Lab Results  Component Value Date   PTH 30 02/07/2018   CALCIUM 9.5 04/30/2019   CALCIUM 8.5 (L) 04/03/2019   CALCIUM 9.0 09/14/2018   CALCIUM 9.3  03/13/2018   CALCIUM 9.1 03/03/2018   CALCIUM 8.5 (L) 02/27/2018   CALCIUM 9.6 02/07/2018   CALCIUM 9.4 05/02/2017   CALCIUM 8.3 (L) 03/18/2017   CALCIUM 9.2 03/11/2017   No h/o thyrotoxicosis.  She actually has mild hypothyroidism and is on levothyroxine 25 mcg daily.  Reviewed TSH recent levels:  Lab Results  Component Value Date   TSH 1.48 04/30/2019   TSH 1.69 02/07/2018   TSH 1.55 03/07/2017   TSH 2.62 08/21/2014   TSH 1.48 08/29/2013   No h/o CKD. Last BUN/Cr: Lab Results  Component Value Date   BUN 9 04/30/2019   CREATININE 0.73 04/30/2019   She also has a history of thyroid nodules:  Thyroid ultrasound (03/27/2018): Dominant nodule is isoechoic, 2.4 x 1.5 x 1.8 cm.  She also had subcentimeter nodules.  Recommendation was made to follow-up with another ultrasound in a year.  She did not have this yet.  Pt denies: - feeling nodules in neck - hoarseness - dysphagia - choking - SOB with lying down  She was sick last Fall >> was hospitalized.  She was found to have hyponatremia at that time.    She describes a 10-year history of increased stress due to a protracted divorce process.  She is seeing an integrative medicine provider and they checked her cortisol and this was low.  She was told that this may have been related to  the increased stress from her divorce.  Records were brought by patient today.  I reviewed the records.  Reviewed sodium levels -chronically low: Lab Results  Component Value Date   NA 130 (L) 04/30/2019   NA 123 (L) 04/03/2019   NA 135 09/14/2018   NA 131 (L) 03/13/2018   NA 126 (L) 03/03/2018   NA 126 (L) 02/27/2018   NA 132 (L) 05/02/2017   NA 132 (L) 03/18/2017   NA 133 (L) 03/11/2017   NA 127 (L) 03/07/2017   I reviewed the records from her integrative medicine provider (will be scanned): Hair analysis: -Normal with exception of slightly high Zinc  ROS: Constitutional: no weight gain, no weight loss, no fatigue, no subjective  hyperthermia, no subjective hypothermia, no nocturia Eyes: no blurry vision, no xerophthalmia ENT: no sore throat, + see HPI, no tinnitus, no hypoacusis Cardiovascular: no CP, no SOB, no palpitations, no leg swelling Respiratory: no cough, no SOB, no wheezing Gastrointestinal: no N, no V, no D, no C, no acid reflux Musculoskeletal: no muscle, + joint aches Skin: no rash, no hair loss Neurological: no tremors, no numbness or tingling/+ dizziness/no HAs Psychiatric: no depression, no anxiety  I reviewed pt's medications, allergies, PMH, social hx, family hx, and changes were documented in the history of present illness. Otherwise, unchanged from my initial visit note.  Past Medical History:  Diagnosis Date  . Arthritis   . Bowel habit changes    been going on couple years  . Depression   . Flatulence    excessive with strong odor/uncontrollable  . Headache   . 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 History of UTI   . Migraines   . PONV (postoperative nausea and vomiting)   . PTSD (post-traumatic stress disorder)   . STD (sexually transmitted disease)   . Substance abuse ([place])   . Urine incontinence    Past Surgical History:  Procedure Laterality Date  . BREAST BIOPSY Left    2017 benign x 2  . CESAREAN SECTION  1992  . COLONOSCOPY    . INCONTINENCE SURGERY    . LAPAROSCOPY     under bladder  . NECK SURGERY  02/24/2018   ACDF  . TOTAL HIP ARTHROPLASTY     left  . TOTAL KNEE ARTHROPLASTY Left 12/23/2015   Procedure: TOTAL KNEE ARTHROPLASTY;  Surgeon: Melrose Nakayama, MD;  Location: [facility];  Service: Orthopedics;  Laterality: Left;   Social History   Socioeconomic History  . Marital status: Divorced    Spouse name: Not on file  . Number of children: Not on file  . Years of education: Not on file  . Highest education level: Not on file  Occupational History  . Not on file  Tobacco Use  . Smoking status: Former Smoker    Packs/day: 0.50    Years:  10.00    Pack years: 5.00    Types: Cigarettes    Quit date: 11/13/2010    Years since quitting: 8.7  . Smokeless tobacco: Never Used  Substance and Sexual Activity  . Alcohol use: No  . Drug use: No  . Sexual activity: Not Currently    Birth control/protection: Post-menopausal  Other Topics Concern  . Not on file  Social History Narrative  . Not on file   Social Determinants of Health   Financial Resource Strain:   . Difficulty of Paying Living Expenses: Not on file  Food Insecurity:   .  Worried About Charity fundraiser in the Last Year: Not on file  . Ran Out of Food in the Last Year: Not on file  Transportation Needs:   . Lack of Transportation (Medical): Not on file  . Lack of Transportation (Non-Medical): Not on file  Physical Activity:   . Days of Exercise per Week: Not on file  . Minutes of Exercise per Session: Not on file  Stress:   . Feeling of Stress : Not on file  Social Connections:   . Frequency of Communication with Friends and Family: Not on file  . Frequency of Social Gatherings with Friends and Family: Not on file  . Attends Religious Services: Not on file  . Active Member of Clubs or Organizations: Not on file  . Attends Archivist Meetings: Not on file  . Marital Status: Not on file  Intimate Partner Violence:   . Fear of Current or Ex-Partner: Not on file  . Emotionally Abused: Not on file  . Physically Abused: Not on file  . Sexually Abused: Not on file   Current Outpatient Medications on File Prior to Visit  Medication Sig Dispense Refill  . AIMOVIG 70 MG/ML SOAJ Inject 1 mL into the skin every 30 (thirty) days.    Marland Kitchen alendronate (FOSAMAX) 70 MG tablet Take 1 tablet (70 mg total) by mouth every 7 (seven) days. Take first thing in am with 6 oz. Water.  Be upright after taking.  Eat nothing for one hour. 12 tablet 3  . Calcium Carbonate-Vit D-Min (CALCIUM 1200 PO) Take 2 tablets by mouth daily as needed.     . Cholecalciferol (VITAMIN D3)  1.25 MG (50000 UT) CAPS Take 1 capsule by mouth once a week. 12 capsule 3  . clonazePAM (KLONOPIN) 1 MG tablet TAKE 1 TABLET BY MOUTH AT BEDTIME EVERY DAY 90 tablet 0  . doxycycline (VIBRAMYCIN) 100 MG capsule Take 1 capsule (100 mg total) by mouth 2 (two) times daily. 20 capsule 0  . Estradiol 10 MCG TABS vaginal tablet Use one tablet qhs x 1 week, then 2 x a week at hs 24 tablet 3  . fluticasone (FLONASE) 50 MCG/ACT nasal spray Place 2 sprays daily into both nostrils. 16 g 11  . hydrOXYzine (ATARAX/VISTARIL) 10 MG tablet TAKE 1 TABLET (10 MG TOTAL) BY MOUTH 3 (THREE) TIMES DAILY AS NEEDED FOR ANXIETY. 270 tablet 1  . levothyroxine (SYNTHROID) 25 MCG tablet TAKE 1 TABLET BY MOUTH EVERY DAY BEFORE BREAKFAST 90 tablet 0  . lidocaine (XYLOCAINE) 5 % ointment APPLY 1 APPLICATION TOPICALLY 4 TIMES DAILY AS NEEDED 35.44 g 0  . MAGNESIUM PO Take 2 tablets by mouth daily.    . montelukast (SINGULAIR) 10 MG tablet Take 1 tablet (10 mg total) by mouth at bedtime. 90 tablet 3  . SUMAtriptan (IMITREX) 100 MG tablet TAKE 1 TABLET BY MOUTH AS NEEDED FOR HEADACHE, IF HEADACHE PERSISTS OR RECURS REPEAT IN 2 HOURS 9 tablet 4  . tiZANidine (ZANAFLEX) 4 MG capsule 1 tablet at night 30 capsule 0  . valACYclovir (VALTREX) 500 MG tablet 1 tab po BID x 3 days prn 90 tablet 1  . venlafaxine XR (EFFEXOR-XR) 75 MG 24 hr capsule TAKE 1 CAPSULE (75 MG TOTAL) BY MOUTH DAILY WITH BREAKFAST. 90 capsule 1   No current facility-administered medications on file prior to visit.   Allergies  Allergen Reactions  . Sulfa Antibiotics Hives   Family History  Problem Relation Age of Onset  . Hypertension  Mother   . Lung cancer Mother   . Alcohol abuse Other   . Arthritis Other   . Hypertension Other   . Mental illness Other   . Kidney cancer Father   . Ulcers Father   . Drug abuse Sister   . Hypertension Maternal Grandmother   . Stomach cancer Maternal Uncle   . Colon cancer Neg Hx   . Esophageal cancer Neg Hx      PE: BP 130/90   Pulse 88   Ht 5' 4.5" (1.638 m)   Wt 150 lb (68 kg)   LMP 06/07/2010 (Approximate)   SpO2 98%   BMI 25.35 kg/m  Wt Readings from Last 3 Encounters:  08/06/19 150 lb (68 kg)  06/05/19 153 lb 3.2 oz (69.5 kg)  04/30/19 148 lb 9.6 oz (67.4 kg)   Constitutional: Normal weight, in NAD. No kyphosis. Eyes: PERRLA, EOMI, no exophthalmos ENT: moist mucous membranes, no thyromegaly, no cervical lymphadenopathy Cardiovascular: RRR, No MRG Respiratory: CTA B Gastrointestinal: abdomen soft, NT, ND, BS+ Musculoskeletal: no deformities, strength intact in all 4 Skin: moist, warm, no rashes Neurological: no tremor with outstretched hands, DTR normal in all 4  Assessment: 1. Osteoporosis  2.  Thyroid nodules  Plan: 1. Osteoporosis - likely postmenopausal/age-related +/-steroid injection-related, she also has FH of OP -We reviewed her 2 available DXA scan reports together, and based on the T scores, she has an increased risk for fractures.  However, the 2 scans were done on 2 different machines.  On the last scan performed at the breast center, 2 of the vertebrae were eliminated from the analysis.  Therefore, the 2 scans are not directly comparable.  Her hip bone mineral densities appear to be in the osteopenia range.  She is due for another bone density scan in 04/2020.  We discussed about obtaining this on the [place] lunar DXA machine and compare the scores with the ones in 2017. - we reviewed her dietary and supplemental calcium and vitamin D intake. She gets at least 1000 mg of calcium daily from diet and supplements and I will check vit D today to see if she needs further supplementation.  She is on a multivitamin and also takes 2000 units vitamin D daily - discussed fall precautions   - given handout from [place] Re: weight bearing exercises - advised to do this every day or at least 5/7 days - I also recommended the [place] center - for  skeletal loading. Explained benefits. - we discussed about maintaining a good amount of protein in her diet, but not necessarily from animal sources. The recommended daily protein intake is ~0.8 g per kilogram per day (approximately 70 g for her).Also, she is not smoking or having>2 drinks of alcohol a day. - I recommended the following book for the concept of low acid eating:  - We discussed about the different medication classes, benefits and side effects (including atypical fractures and ONJ - no dental workup in progress or planned).  The options are: Sq denosumab (Prolia) sq every 6 months for 6-10 years,  zoledronic acid (Reclast) iv 1x a year for 1-2 years. Teriparatide/Abaloparatide (which are daily subcu medication) or Romosozumab (monthly) are last resort medications for patients with severe osteoporosis, which she does not have.  She does not feel that she could give herself injections every day. - Will check the following labs today: Orders Placed This Encounter  Procedures  . US THYROID  . Basic metabolic panel  . VITAMIN  D 25 Hydroxy (Vit-D Deficiency, Fractures)  . Cortisol  -For now, we decided to focus on improving her vitamin D, exercise, diet, and we will check a new DXA scan in 04/2020, when she is due for a repeat.  Afterwards, if the T-scores are worse, we may need to start medication -my recommendation would be Prolia. - will see pt back in 11-05/2020  2.  Thyroid nodules -1 dominant nodule, not worrisome appearing but fairly large, 2.4 cm in the largest dimension -No neck compression symptoms -Most recent TSH normal -We will repeat a thyroid ultrasound  3.  Hyponatremia -We did not have time to address this in detail  -Chronic -Associated with dizziness, now improved -Latest sodium was reviewed and this was improved, at 130 -I reviewed her integrative medicine records but I do not see evidence of potential adrenal insufficiency... -On the basis of her hyponatremia,  however, will plan to check her for adrenal insufficiency by a cosyntropin stimulation test.  I explained how this test is performed -For now, we will check a cortisol level at 9 AM -Of note, recent TSH was normal  - time spent with the patient and reviewing records which were summarized in HPI: 1 hour, spent in obtaining information about her symptoms, reviewing her previous labs, evaluations, and treatments, counseling her about her conditions (please see the discussed topics above), and developing a plan to further investigate and treat them; she had a number of questions which I addressed.  Component     Latest Ref Rng & Units 08/06/2019  Sodium     135 - 145 mEq/L 128 (L)  Potassium     3.5 - 5.1 mEq/L 4.7  Chloride     96 - 112 mEq/L 92 (L)  CO2     19 - 32 mEq/L 28  Glucose     70 - 99 mg/dL 104 (H)  BUN     6 - 23 mg/dL 11  Creatinine     0.40 - 1.20 mg/dL 0.73  Calcium     8.4 - 10.5 mg/dL 9.5  GFR     >60.00 mL/min 81.70  VITD     30.00 - 100.00 ng/mL 58.71  Cortisol, Plasma     ug/dL 11.2  Na again low. Vitamin D normal. Cortisol about 10, but will still have her back for cosyntropin stimulation test.  Component     Latest Ref Rng & Units 08/10/2019 08/10/2019 08/10/2019         8:21 AM  8:56 AM  9:27 AM  Cortisol, Plasma     ug/dL 12.4 25.1 28.4  Cosyntropin stimulation test normal.  ACTH level still pending  Thyroid ultrasound (08/10/2019): Narrative & Impression    CLINICAL DATA:  Prior ultrasound follow-up. 59 year old female with a left-sided thyroid nodule currently under surveillance.  EXAM: THYROID ULTRASOUND  TECHNIQUE: Ultrasound examination of the thyroid gland and adjacent soft tissues was performed.  COMPARISON:  Prior thyroid ultrasound 03/27/2018  FINDINGS: Parenchymal Echotexture: Normal Isthmus: 0.3 cm Right lobe: 4.5 x 1.5 x 1.6 cm Left lobe: 4.7 x 1.9 x 2.7 cm _________________________________________________________  Nodule #  1: Prior biopsy: No Location: Left; Mid Maximum size: 2.9 cm; Other 2 dimensions: 2.1 x 1.8 cm, previously, 2.5 x 1.8 x 1.5 cm Composition: solid/almost completely solid (2) Echogenicity: isoechoic (1) Significant change in size (>/= 20% in two dimensions and minimal increase of 2 mm): Yes  **Given size (>/= 2.5 cm) and appearance, fine needle aspiration of this mildly suspicious  nodule should be considered based on TI-RADS criteria. _________________________________________________________  Stable small subcentimeter nodules in the right gland.  IMPRESSION: Enlarging TI-RADS category 3 (mildly suspicious) in the left mid gland. This nodule now meets criteria to consider fine-needle aspiration biopsy.  The above is in keeping with the ACR TI-RADS recommendations - J Am Coll Radiol 2017;14:587-595.   Electronically Signed   By: Jacqulynn Cadet M.D.   On: 08/10/2019 15:31   Will suggest biopsy of the 2.9 cm nodule.  FNA of the nodule (08/15/2019):  CYTOLOGY - NON PAP  CASE: [id number]  PATIENT: Gabriela Shepard  Non-Gynecological Cytology Report   Clinical History: None provided  Specimen Submitted: A. THYROID, LEFT, FINE NEEDLE ASPIRATION:   FINAL MICROSCOPIC DIAGNOSIS:  - Consistent with benign follicular nodule ([place] category II)   SPECIMEN ADEQUACY:  Satisfactory for evaluation   Philemon Kingdom, MD PhD [facility] Endocrinology

## 2019-08-06 NOTE — Patient Instructions (Addendum)
Please stop at the lab.  Sent me a message in 04/2019 to order the new bone density.  Please come back for a follow-up appointment in 9 months.  How Can I Prevent Falls? Men and women with osteoporosis need to take care not to fall down. Falls can break bones. Some reasons people fall are: Poor vision  Poor balance  Certain diseases that affect how you walk  Some types of medicine, such as sleeping pills.  Some tips to help prevent falls outdoors are: Use a cane or walker  Wear rubber-soled shoes so you don't slip  Walk on grass when sidewalks are slippery  In winter, put salt or kitty litter on icy sidewalks.  Some ways to help prevent falls indoors are: Keep rooms free of clutter, especially on floors  Use plastic or carpet runners on slippery floors  Wear low-heeled shoes that provide good support  Do not walk in socks, stockings, or slippers  Be sure carpets and area rugs have skid-proof backs or are tacked to the floor  Be sure stairs are well lit and have rails on both sides  Put grab bars on bathroom walls near tub, shower, and toilet  Use a rubber bath mat in the shower or tub  Keep a flashlight next to your bed  Use a sturdy step stool with a handrail and wide steps  Add more lights in rooms (and night lights) Buy a cordless phone to keep with you so that you don't have to rush to the phone       when it rings and so that you can call for help if you fall.   (adapted from http://www.niams.NightlifePreviews.se)  Please check out the following book about best diet for bone health:   Exercise for Strong Bones (from [place]) There are two types of exercises that are important for building and maintaining bone density:  weight-bearing and muscle-strengthening exercises. Weight-bearing Exercises These exercises include activities that make you move against gravity while staying upright. Weight-bearing exercises  can be high-impact or low-impact. High-impact weight-bearing exercises help build bones and keep them strong. If you have broken a bone due to osteoporosis or are at risk of breaking a bone, you may need to avoid high-impact exercises. If you're not sure, you should check with your healthcare provider. Examples of high-impact weight-bearing exercises are: . Dancing . Doing high-impact aerobics . Hiking . Jogging/running . Jumping Rope . Stair climbing . Tennis Low-impact weight-bearing exercises can also help keep bones strong and are a safe alternative if you cannot do high-impact exercises. Examples of low-impact weight-bearing exercises are: . Using elliptical training machines . Doing low-impact aerobics . Using stair-step machines . Fast walking on a treadmill or outside Muscle-Strengthening Exercises These exercises include activities where you move your body, a weight or some other resistance against gravity. They are also known as resistance exercises and include: . Lifting weights . Using elastic exercise bands . Using weight machines . Lifting your own body weight . Functional movements, such as standing and rising up on your toes Yoga and Pilates can also improve strength, balance and flexibility. However, certain positions may not be safe for people with osteoporosis or those at increased risk of broken bones. For example, exercises that have you bend forward may increase the chance of breaking a bone in the spine. A physical therapist should be able to help you learn which exercises are safe and appropriate for you. Non-Impact Exercises Non-impact exercises can help you  to improve balance, posture and how well you move in everyday activities. These exercises can also help to increase muscle strength and decrease the risk of falls and broken bones. Some of these exercises include: . Balance exercises that strengthen your legs and test your balance, such as Tai Chi, can decrease  your risk of falls. . Posture exercises that improve your posture and reduce rounded or "sloping" shoulders can help you decrease the chance of breaking a bone, especially in the spine. . Functional exercises that improve how well you move can help you with everyday activities and decrease your chance of falling and breaking a bone. For example, if you have trouble getting up from a chair or climbing stairs, you should do these activities as exercises. A physical therapist can teach you balance, posture and functional exercises. Starting a New Exercise Program If you haven't exercised regularly for a while, check with your healthcare provider before beginning a new exercise program-particularly if you have health problems such as heart disease, diabetes or high blood pressure. If you're at high risk of breaking a bone, you should work with a physical therapist to develop a safe exercise program. Once you have your healthcare provider's approval, start slowly. If you've already broken bones in the spine because of osteoporosis, be very careful to avoid activities that require reaching down, bending forward, rapid twisting motions, heavy lifting and those that increase your chance of a fall. As you get started, your muscles may feel sore for a day or two after you exercise. If soreness lasts longer, you may be working too hard and need to ease up. Exercises should be done in a pain-free range of motion. How Much Exercise Do You Need? Weight-bearing exercises 30 minutes on most days of the week. Do a 30-minutesession or multiple sessions spread out throughout the day. The benefits to your bones are the same.   Muscle-strengthening exercises Two to three days per week. If you don't have much time for strengthening/resistance training, do small amounts at a time. You can do just one body part each day. For example do arms one day, legs the next and trunk the next. You can also spread these exercises out during  your normal day.  Balance, posture and functional exercises Every day or as often as needed. You may want to focus on one area more than the others. If you have fallen or lose your balance, spend time doing balance exercises. If you are getting rounded shoulders, work more on posture exercises. If you have trouble climbing stairs or getting up from the couch, do more functional exercises. You can also perform these exercises at one time or spread them during your day. Work with a phyiscal therapist to learn the right exercises for you.   Look at [place].

## 2019-08-10 ENCOUNTER — Ambulatory Visit
Admission: RE | Admit: 2019-08-10 | Discharge: 2019-08-10 | Disposition: A | Discharge status: Home / Self Care | Payer: BC Managed Care – PPO | Source: Ambulatory Visit | Attending: Internal Medicine | Admitting: Internal Medicine

## 2019-08-10 ENCOUNTER — Other Ambulatory Visit (INDEPENDENT_AMBULATORY_CARE_PROVIDER_SITE_OTHER): Payer: BC Managed Care – PPO

## 2019-08-10 ENCOUNTER — Other Ambulatory Visit: Payer: Self-pay

## 2019-08-10 ENCOUNTER — Ambulatory Visit (INDEPENDENT_AMBULATORY_CARE_PROVIDER_SITE_OTHER): Payer: BC Managed Care – PPO

## 2019-08-10 DIAGNOSIS — E041 Nontoxic single thyroid nodule: Secondary | ICD-10-CM | POA: Diagnosis not present

## 2019-08-10 DIAGNOSIS — E042 Nontoxic multinodular goiter: Secondary | ICD-10-CM

## 2019-08-10 DIAGNOSIS — E871 Hypo-osmolality and hyponatremia: Secondary | ICD-10-CM

## 2019-08-10 DIAGNOSIS — R635 Abnormal weight gain: Secondary | ICD-10-CM | POA: Diagnosis not present

## 2019-08-10 LAB — CORTISOL
Cortisol, Plasma: 12.4 ug/dL
Cortisol, Plasma: 25.1 ug/dL
Cortisol, Plasma: 28.4 ug/dL

## 2019-08-10 MED ORDER — COSYNTROPIN 0.25 MG IJ SOLR
0.2500 mg | Freq: Once | INTRAMUSCULAR | Status: AC
  Administered 2019-08-10: 0.25 mg via INTRAMUSCULAR

## 2019-08-10 NOTE — Progress Notes (Signed)
Per orders of Dr. Cruzita Lederer injection of [place] given today by [employer]. Patient tolerated injection well.

## 2019-08-11 ENCOUNTER — Encounter: Payer: Self-pay | Admitting: Internal Medicine

## 2019-08-13 ENCOUNTER — Other Ambulatory Visit: Payer: Self-pay | Admitting: Internal Medicine

## 2019-08-13 DIAGNOSIS — E042 Nontoxic multinodular goiter: Secondary | ICD-10-CM

## 2019-08-13 LAB — ACTH: C206 ACTH: 28 pg/mL (ref 6–50)

## 2019-08-15 ENCOUNTER — Other Ambulatory Visit: Payer: Self-pay | Admitting: Internal Medicine

## 2019-08-15 ENCOUNTER — Ambulatory Visit
Admission: RE | Admit: 2019-08-15 | Discharge: 2019-08-15 | Disposition: A | Discharge status: Home / Self Care | Payer: BC Managed Care – PPO | Source: Ambulatory Visit | Attending: Internal Medicine | Admitting: Internal Medicine

## 2019-08-15 ENCOUNTER — Other Ambulatory Visit (HOSPITAL_COMMUNITY)
Admission: RE | Admit: 2019-08-15 | Discharge: 2019-08-15 | Disposition: A | Discharge status: Home / Self Care | Payer: BC Managed Care – PPO | Source: Ambulatory Visit | Attending: Internal Medicine | Admitting: Internal Medicine

## 2019-08-15 ENCOUNTER — Other Ambulatory Visit: Payer: Self-pay

## 2019-08-15 ENCOUNTER — Other Ambulatory Visit (INDEPENDENT_AMBULATORY_CARE_PROVIDER_SITE_OTHER): Payer: BC Managed Care – PPO

## 2019-08-15 DIAGNOSIS — E042 Nontoxic multinodular goiter: Secondary | ICD-10-CM

## 2019-08-15 DIAGNOSIS — E041 Nontoxic single thyroid nodule: Secondary | ICD-10-CM | POA: Insufficient documentation

## 2019-08-16 DIAGNOSIS — F4312 Post-traumatic stress disorder, chronic: Secondary | ICD-10-CM | POA: Diagnosis not present

## 2019-08-16 LAB — T4, FREE: Free T4: 0.69 ng/dL (ref 0.60–1.60)

## 2019-08-16 LAB — T3, FREE: T3, Free: 2.9 pg/mL (ref 2.3–4.2)

## 2019-08-16 LAB — TSH: TSH: 1.85 u[IU]/mL (ref 0.35–4.50)

## 2019-08-17 LAB — CYTOLOGY - NON PAP

## 2019-08-30 DIAGNOSIS — F4312 Post-traumatic stress disorder, chronic: Secondary | ICD-10-CM | POA: Diagnosis not present

## 2019-09-01 ENCOUNTER — Other Ambulatory Visit: Payer: Self-pay | Admitting: Family Medicine

## 2019-09-02 ENCOUNTER — Other Ambulatory Visit: Payer: Self-pay | Admitting: Family Medicine

## 2019-09-03 ENCOUNTER — Other Ambulatory Visit: Payer: Self-pay | Admitting: Family Medicine

## 2019-09-03 MED ORDER — CLONAZEPAM 1 MG PO TABS: ORAL_TABLET | ORAL | 0 refills | Status: DC

## 2019-09-03 NOTE — Telephone Encounter (Signed)
Last ov:04/30/2019 Last filled:05/27/19

## 2019-09-04 ENCOUNTER — Encounter: Payer: Self-pay | Admitting: Family Medicine

## 2019-09-04 ENCOUNTER — Other Ambulatory Visit: Payer: Self-pay | Admitting: Family Medicine

## 2019-09-04 DIAGNOSIS — F4312 Post-traumatic stress disorder, chronic: Secondary | ICD-10-CM | POA: Diagnosis not present

## 2019-09-05 ENCOUNTER — Encounter: Payer: Self-pay | Admitting: Family Medicine

## 2019-09-05 ENCOUNTER — Telehealth: Payer: BC Managed Care – PPO | Admitting: Family Medicine

## 2019-09-05 NOTE — Telephone Encounter (Signed)
Pt requesting refill on clonazepam to be filled early

## 2019-09-05 NOTE — Telephone Encounter (Signed)
See other message already responded to pt needs appt to discuss

## 2019-09-06 NOTE — Telephone Encounter (Signed)
This has been handled.

## 2019-09-10 ENCOUNTER — Encounter: Payer: Self-pay | Admitting: Family Medicine

## 2019-09-10 MED ORDER — TRETINOIN 0.1 % EX CREA: TOPICAL_CREAM | Freq: Every day | CUTANEOUS | 1 refills | Status: DC

## 2019-09-10 NOTE — Telephone Encounter (Signed)
Sent on rx for Retin A cream 0.1% to try for solar/senile purpura.

## 2019-09-26 ENCOUNTER — Other Ambulatory Visit: Payer: Self-pay

## 2019-09-26 ENCOUNTER — Ambulatory Visit: Payer: BC Managed Care – PPO | Admitting: Family Medicine

## 2019-09-26 ENCOUNTER — Emergency Department (HOSPITAL_COMMUNITY)
Admission: EM | Admit: 2019-09-26 | Discharge: 2019-09-26 | Disposition: A | Discharge status: Home / Self Care | Payer: BC Managed Care – PPO | Attending: Emergency Medicine | Admitting: Emergency Medicine

## 2019-09-26 ENCOUNTER — Encounter (HOSPITAL_COMMUNITY): Payer: Self-pay | Admitting: Emergency Medicine

## 2019-09-26 ENCOUNTER — Telehealth: Payer: Self-pay | Admitting: Family Medicine

## 2019-09-26 ENCOUNTER — Encounter: Payer: Self-pay | Admitting: Family Medicine

## 2019-09-26 DIAGNOSIS — R1013 Epigastric pain: Secondary | ICD-10-CM

## 2019-09-26 DIAGNOSIS — Z79899 Other long term (current) drug therapy: Secondary | ICD-10-CM | POA: Insufficient documentation

## 2019-09-26 DIAGNOSIS — K589 Irritable bowel syndrome without diarrhea: Secondary | ICD-10-CM | POA: Insufficient documentation

## 2019-09-26 DIAGNOSIS — R112 Nausea with vomiting, unspecified: Secondary | ICD-10-CM

## 2019-09-26 DIAGNOSIS — R197 Diarrhea, unspecified: Secondary | ICD-10-CM | POA: Diagnosis not present

## 2019-09-26 LAB — URINALYSIS, ROUTINE W REFLEX MICROSCOPIC
Bacteria, UA: NONE SEEN
Bilirubin Urine: NEGATIVE
Glucose, UA: NEGATIVE mg/dL
Hgb urine dipstick: NEGATIVE
Ketones, ur: NEGATIVE mg/dL
Nitrite: NEGATIVE
Protein, ur: NEGATIVE mg/dL
Specific Gravity, Urine: 1.005 (ref 1.005–1.030)
pH: 6 (ref 5.0–8.0)

## 2019-09-26 LAB — CBC
HCT: 44.8 % (ref 36.0–46.0)
Hemoglobin: 14.8 g/dL (ref 12.0–15.0)
MCH: 29.5 pg (ref 26.0–34.0)
MCHC: 33 g/dL (ref 30.0–36.0)
MCV: 89.2 fL (ref 80.0–100.0)
Platelets: 382 10*3/uL (ref 150–400)
RBC: 5.02 MIL/uL (ref 3.87–5.11)
RDW: 12.1 % (ref 11.5–15.5)
WBC: 7.2 10*3/uL (ref 4.0–10.5)
nRBC: 0 % (ref 0.0–0.2)

## 2019-09-26 LAB — COMPREHENSIVE METABOLIC PANEL
ALT: 41 U/L (ref 0–44)
AST: 45 U/L — ABNORMAL HIGH (ref 15–41)
Albumin: 4.3 g/dL (ref 3.5–5.0)
Alkaline Phosphatase: 46 U/L (ref 38–126)
Anion gap: 12 (ref 5–15)
BUN: 8 mg/dL (ref 6–20)
CO2: 25 mmol/L (ref 22–32)
Calcium: 9.2 mg/dL (ref 8.9–10.3)
Chloride: 96 mmol/L — ABNORMAL LOW (ref 98–111)
Creatinine, Ser: 0.73 mg/dL (ref 0.44–1.00)
GFR calc Af Amer: >60 mL/min (ref >60)
GFR calc non Af Amer: >60 mL/min (ref >60)
Glucose, Bld: 122 mg/dL — ABNORMAL HIGH (ref 70–99)
Potassium: 4.4 mmol/L (ref 3.5–5.1)
Sodium: 133 mmol/L — ABNORMAL LOW (ref 135–145)
Total Bilirubin: 0.5 mg/dL (ref 0.3–1.2)
Total Protein: 8.2 g/dL — ABNORMAL HIGH (ref 6.5–8.1)

## 2019-09-26 LAB — ABO/RH: ABO/RH(D): A POS

## 2019-09-26 LAB — I-STAT BETA HCG BLOOD, ED (MC, WL, AP ONLY): I-stat hCG, quantitative: 5.6 m[IU]/mL — ABNORMAL HIGH (ref <5)

## 2019-09-26 LAB — TYPE AND SCREEN
ABO/RH(D): A POS
Antibody Screen: NEGATIVE

## 2019-09-26 LAB — POC URINE PREG, ED: Preg Test, Ur: NEGATIVE

## 2019-09-26 LAB — POC OCCULT BLOOD, ED: Fecal Occult Bld: NEGATIVE

## 2019-09-26 MED ORDER — DIPHENHYDRAMINE HCL 50 MG/ML IJ SOLN
25.0000 mg | Freq: Once | INTRAMUSCULAR | Status: AC
  Administered 2019-09-26: 25 mg via INTRAVENOUS
  Filled 2019-09-26: qty 1

## 2019-09-26 MED ORDER — METOCLOPRAMIDE HCL 5 MG/ML IJ SOLN
10.0000 mg | Freq: Once | INTRAMUSCULAR | Status: AC
  Administered 2019-09-26: 10 mg via INTRAVENOUS
  Filled 2019-09-26: qty 2

## 2019-09-26 MED ORDER — LIDOCAINE VISCOUS HCL 2 % MT SOLN
15.0000 mL | Freq: Once | OROMUCOSAL | Status: AC
  Administered 2019-09-26: 15 mL via ORAL
  Filled 2019-09-26: qty 15

## 2019-09-26 MED ORDER — FAMOTIDINE IN NACL 20-0.9 MG/50ML-% IV SOLN
20.0000 mg | Freq: Once | INTRAVENOUS | Status: AC
  Administered 2019-09-26: 20 mg via INTRAVENOUS
  Filled 2019-09-26: qty 50

## 2019-09-26 MED ORDER — DICYCLOMINE HCL 20 MG PO TABS: 20.0000 mg | ORAL_TABLET | Freq: Two times a day (BID) | ORAL | 0 refills | Status: DC

## 2019-09-26 MED ORDER — ALUM & MAG HYDROXIDE-SIMETH 200-200-20 MG/5ML PO SUSP
30.0000 mL | Freq: Once | ORAL | Status: AC
  Administered 2019-09-26: 30 mL via ORAL
  Filled 2019-09-26: qty 30

## 2019-09-26 MED ORDER — SODIUM CHLORIDE 0.9 % IV SOLN: Freq: Once | INTRAVENOUS | Status: AC

## 2019-09-26 MED ORDER — DICYCLOMINE HCL 10 MG PO CAPS
20.0000 mg | ORAL_CAPSULE | Freq: Once | ORAL | Status: AC
  Administered 2019-09-26: 20 mg via ORAL
  Filled 2019-09-26: qty 2

## 2019-09-26 MED ORDER — ONDANSETRON 4 MG PO TBDP: 4.0000 mg | ORAL_TABLET | Freq: Three times a day (TID) | ORAL | 0 refills | Status: DC | PRN

## 2019-09-26 NOTE — ED Triage Notes (Signed)
Pt c/o abd pains with nausea since Friday. Reports had constant heating pad on her abd and taking her prescribed nausea meds. Pt reports yesterday evening that had very dark stools.

## 2019-09-26 NOTE — Telephone Encounter (Signed)
Per Dr.Burchette pt needs to go To ER for those symptoms and definitely  will need labs for that and we will not have lab at the time. Called pt and told her she states she will go somewhere else

## 2019-09-26 NOTE — ED Provider Notes (Signed)
[facility] DEPT Provider Note   CSN: [id number] Arrival date & time: 09/26/19  1230     History Chief Complaint  Patient presents with  . Abdominal Pain  . Nausea    Gabriela Shepard is a 59 y.o. female.  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  Patient presents today with nausea without vomiting, crampy abdominal pain that began on Friday evening and has been consistent and worsening since.  She states that she has been sleeping without difficulty.  She states that she has a tendency to have low sodium which she states she has no explanation for.  Patient states that she did not have a bowel movement for 3 days which concerned her as she normally has a bowel movement once to every other day.  She states that yesterday after eating a significant number of prunes she had a large diarrhea bowel movement.  She states mild relief of pain since that time.  She states that her pain is in her upper abdomen.  She states that she has had a C-section but no other abdominal surgeries.  Patient describes the pain as crampy, achy, constant, no aggravating or mitigating factors.  She states she has been taking significant amounts of Pepto-Bismol with very little relief.  She states that she had a dark bowel movement with her diarrhea and states that when she told her primary care doctor about this they told her to come to ED for evaluation and concern for GI bleed.     Past Medical History:  Diagnosis Date  . Arthritis   . Bowel habit changes    been going on couple years  . Depression   . Flatulence    excessive with strong odor/uncontrollable  . Headache   . 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 History of UTI   . Migraines   . PONV (postoperative nausea and  vomiting)   . PTSD (post-traumatic stress disorder)   . STD (sexually transmitted disease)   . Substance abuse ([place])   . Urine incontinence     Patient Active Problem List   Diagnosis Date Noted  . Age-related osteoporosis without current pathological fracture 08/06/2019  . Hyponatremia 08/06/2019  . Multiple thyroid nodules 08/06/2019  . 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 Piriformis syndrome, right 08/09/2018  . Weight gain 02/08/2018  . Trigger point of right shoulder region 12/22/2017  . Cervical radiculopathy at C6 11/29/2017  . Osteopenia 03/09/2016  . Primary osteoarthritis of left knee 12/23/2015  . IBS (irritable bowel syndrome) 02/19/2015  . Postmenopausal 12/19/2013  . Migraine headache 12/05/2012  . Pain, upper back 09/24/2010  . Depression 09/04/2010    Past Surgical History:  Procedure Laterality Date  . BREAST BIOPSY Left    2017 benign x 2  . CESAREAN SECTION  1992  . COLONOSCOPY    . INCONTINENCE SURGERY    . LAPAROSCOPY     under bladder  . NECK SURGERY  02/24/2018   ACDF  . TOTAL HIP ARTHROPLASTY     left  . TOTAL KNEE ARTHROPLASTY Left 12/23/2015   Procedure: TOTAL KNEE ARTHROPLASTY;  Surgeon: Melrose Nakayama, MD;  Location: [facility];  Service: Orthopedics;  Laterality: Left;     OB History    Gravida  2  Para  2   Term  2   Preterm      AB      Living  2     SAB      TAB      Ectopic      Multiple      Live Births  2           Family History  Problem Relation Age of Onset  . Hypertension Mother   . Lung cancer Mother   . Alcohol abuse Other   . Arthritis Other   . Hypertension Other   . Mental illness Other   . Kidney cancer Father   . Ulcers Father   . Drug abuse Sister   . Hypertension Maternal Grandmother   . Stomach cancer Maternal Uncle   . Colon cancer Neg Hx   . Esophageal cancer Neg Hx     Social History   Tobacco Use  . Smoking status: Former Smoker    Packs/day: 0.50    Years: 10.00    Pack  years: 5.00    Types: Cigarettes    Quit date: 11/13/2010    Years since quitting: 8.8  . Smokeless tobacco: Never Used  Substance Use Topics  . Alcohol use: No  . Drug use: No    Home Medications Prior to Admission medications   Medication Sig Start Date End Date Taking? Authorizing Provider  AIMOVIG 70 MG/ML SOAJ INJECT 70 MG INTO THE SKIN EVERY 30 (THIRTY) DAYS. Patient taking differently: Inject 70 mg as directed every 30 (thirty) days.  09/04/19  Yes Burchette, Alinda Sierras, MD  Cholecalciferol (VITAMIN D3) 1.25 MG (50000 UT) CAPS Take 1 capsule by mouth once a week. Patient taking differently: Take 50,000 Units by mouth once a week.  06/06/19  Yes Burchette, Alinda Sierras, MD  clonazePAM (KLONOPIN) 1 MG tablet Take one tablet by mouth once daily Patient taking differently: Take 1 mg by mouth daily. Take one tablet by mouth once daily 09/03/19  Yes Burchette, Alinda Sierras, MD  esomeprazole (NEXIUM) 40 MG capsule Take 40 mg by mouth daily as needed (indigestion).  09/02/19  Yes [provider]  hydrOXYzine (ATARAX/VISTARIL) 10 MG tablet TAKE 1 TABLET (10 MG TOTAL) BY MOUTH 3 (THREE) TIMES DAILY AS NEEDED FOR ANXIETY. 05/28/19  Yes Hulan Saas M, DO  montelukast (SINGULAIR) 10 MG tablet TAKE 1 TABLET BY MOUTH EVERYDAY AT BEDTIME Patient taking differently: Take 10 mg by mouth at bedtime.  09/03/19  Yes Lyndal Pulley, DO  Multiple Vitamin (MULTIVITAMIN WITH MINERALS) TABS tablet Take 1 tablet by mouth daily.   Yes [provider]  SUMAtriptan (IMITREX) 100 MG tablet TAKE 1 TABLET BY MOUTH AS NEEDED FOR HEADACHE, IF HEADACHE PERSISTS OR RECURS REPEAT IN 2 HOURS Patient taking differently: Take 100 mg by mouth every 2 (two) hours as needed for headache. TAKE 1 TABLET BY MOUTH AS NEEDED FOR HEADACHE, IF HEADACHE PERSISTS OR RECURS REPEAT IN 2 HOURS 06/11/19  Yes Burchette, Alinda Sierras, MD  tretinoin (RETIN-A) 0.1 % cream Apply topically at bedtime. 09/10/19  Yes Burchette, Alinda Sierras, MD    venlafaxine XR (EFFEXOR-XR) 150 MG 24 hr capsule Take 150 mg by mouth every morning. 09/02/19  Yes [provider]  dicyclomine (BENTYL) 20 MG tablet Take 1 tablet (20 mg total) by mouth 2 (two) times daily. 09/26/19   Tedd Sias, PA  levothyroxine (SYNTHROID) 25 MCG tablet TAKE 1 TABLET BY MOUTH EVERY DAY BEFORE BREAKFAST Patient not  taking: Reported on 09/26/2019 06/12/19   Lyndal Pulley, DO  ondansetron (ZOFRAN ODT) 4 MG disintegrating tablet Take 1 tablet (4 mg total) by mouth every 8 (eight) hours as needed for nausea or vomiting. 09/26/19   Tedd Sias, PA  venlafaxine XR (EFFEXOR-XR) 75 MG 24 hr capsule TAKE 1 CAPSULE (75 MG TOTAL) BY MOUTH DAILY WITH BREAKFAST. Patient not taking: Reported on 09/26/2019 06/29/19   Eulas Post, MD  fluticasone [facility]) 50 MCG/ACT nasal spray Place 2 sprays daily into both nostrils. Patient not taking: Reported on 09/26/2019 04/14/17 09/26/19  Eulas Post, MD    Allergies    Sulfa antibiotics  Review of Systems   Review of Systems  Constitutional: Positive for fatigue. Negative for chills and fever.  HENT: Negative for congestion.   Eyes: Negative for pain.  Respiratory: Negative for cough and shortness of breath.   Cardiovascular: Negative for chest pain and leg swelling.  Gastrointestinal: Positive for abdominal pain, constipation, diarrhea and nausea. Negative for vomiting.  Genitourinary: Negative for dysuria, frequency, hematuria, pelvic pain, vaginal bleeding and vaginal discharge.  Musculoskeletal: Negative for myalgias.  Skin: Negative for rash.  Neurological: Negative for dizziness and headaches.    Physical Exam Updated Vital Signs BP 112/78   Pulse (!) 59   Temp 97.6 F (36.4 C) (Oral)   Resp 16   LMP 06/07/2010 (Approximate)   SpO2 91%   Physical Exam Vitals and nursing note reviewed.  Constitutional:      General: She is not in acute distress.    Appearance: She is not ill-appearing or  toxic-appearing.     Comments: Patient is pleasant, well-appearing 59 year old female no acute distress.  HENT:     Head: Normocephalic and atraumatic.     Nose: Nose normal.  Eyes:     General: No scleral icterus. Cardiovascular:     Rate and Rhythm: Normal rate and regular rhythm.     Pulses: Normal pulses.     Heart sounds: Normal heart sounds.  Pulmonary:     Effort: Pulmonary effort is normal. No respiratory distress.     Breath sounds: No wheezing.  Abdominal:     General: Abdomen is flat. Bowel sounds are normal.     Palpations: Abdomen is soft.     Tenderness: There is abdominal tenderness in the epigastric area. There is no right CVA tenderness, left CVA tenderness, guarding or rebound. Negative signs include Murphy's sign, McBurney's sign, psoas sign and obturator sign.  Musculoskeletal:     Cervical back: Normal range of motion.     Right lower leg: No edema.     Left lower leg: No edema.  Skin:    General: Skin is warm and dry.     Capillary Refill: Capillary refill takes less than 2 seconds.  Neurological:     Mental Status: She is alert. Mental status is at baseline.  Psychiatric:        Mood and Affect: Mood normal.        Behavior: Behavior normal.     ED Results / Procedures / Treatments   Labs (all labs ordered are listed, but only abnormal results are displayed) Labs Reviewed  COMPREHENSIVE METABOLIC PANEL - Abnormal; Notable for the following components:      Result Value   Sodium 133 (*)    Chloride 96 (*)    Glucose, Bld 122 (*)    Total Protein 8.2 (*)    AST 45 (*)    All  other components within normal limits  URINALYSIS, ROUTINE W REFLEX MICROSCOPIC - Abnormal; Notable for the following components:   Leukocytes,Ua MODERATE (*)    All other components within normal limits  I-STAT BETA HCG BLOOD, ED ([facility], [facility], [facility] ONLY) - Abnormal; Notable for the following components:   I-stat hCG, quantitative 5.6 (*)    All other components within normal limits   CBC  POC OCCULT BLOOD, ED  POC URINE PREG, ED  TYPE AND SCREEN  ABO/RH    EKG None  Radiology No results found.  Procedures Procedures (including critical care time)  Medications Ordered in ED Medications  0.9 %  sodium chloride infusion ( Intravenous New Bag/Given 09/26/19 1411)  alum & mag hydroxide-simeth (MAALOX/MYLANTA) 200-200-20 MG/5ML suspension 30 mL (30 mLs Oral Given 09/26/19 1411)    And  lidocaine (XYLOCAINE) 2 % viscous mouth solution 15 mL (15 mLs Oral Given 09/26/19 1411)  dicyclomine (BENTYL) capsule 20 mg (20 mg Oral Given 09/26/19 1411)  famotidine (PEPCID) IVPB 20 mg premix (20 mg Intravenous New Bag/Given 09/26/19 1411)  metoCLOPramide (REGLAN) injection 10 mg (10 mg Intravenous Given 09/26/19 1411)  diphenhydrAMINE (BENADRYL) injection 25 mg (25 mg Intravenous Given 09/26/19 1411)    ED Course  I have reviewed the triage vital signs and the nursing notes.  Pertinent labs & imaging results that were available during my care of the patient were reviewed by me and considered in my medical decision making (see chart for details).  Patient with history of IBS presented today for epigastric/generalized abdominal pain for the past 5 days with nausea, vomiting, diarrhea.  Nonbloody nonbilious vomiting and nonbloody stool with no fever.  Physical exam notable for some epigastric tenderness to palpation.  She has some generalized tenderness as well.  No focal tenderness.  She has no urinary symptoms.  Physical exam is overall reassuring.  She does appear somewhat dehydrated will replete.  Her sodium is 133 which is somewhat low but better than her usual low sodium.  This is a chronic issue for her she is uncertain why.   Clinical Course as of Sep 25 1521  Wed Sep 26, 2019  1409 Fecal occult negative.  Suspect the patient's dark stools is secondary to Pepto-Bismol use.  CBC is within normal limits.  POC occult blood, ED [WF]  1410 CBC without anemia or leukocytosis.    CBC [WF]  1410 CMP with mild chronic hyponatremia.  Patient states that this is normal for her.  She is not symptomatic.  She is receiving 1 L normal saline as she has been eating and drinking less because of her nausea vomiting.  Blood sugars approximately within normal limits.  AST very mildly elevated.  Discussed these results with patient she is understanding of all results.  Comprehensive metabolic panel(!) [WF]  [id number] 59 year old female history of IBS complaining of nausea and generalized abdominal pain for about 5 days.  Using her medications and heating pad without any improvement.  Afebrile here normal white count.  Getting labs and symptomatic treatment.  Disposition per results of testing.   [MB]  [id number] Beta-hCG initially marginally positive.  POC urine pregnancy test was negative.  I discussed with patient she is not sexually active.  She is understanding of positives and likelihood of this being a false positive.  POC Urine Pregnancy, ED (not at [facility]) [WF]    Clinical Course User Index [MB] Hayden Rasmussen, MD [WF] Pati Gallo S, [place]   Urinalysis is without any acute abnormalities.  There are some leukocytes present however no evidence of infected urine.  Patient reassessed after GI cocktail, Bentyl, Reglan, Benadryl, normal saline and feels significantly improved.  She states that she has no cramping currently and no nausea.  We will discharge with Zofran and Bentyl.  Will recommend she continue to use prunes and MiraLAX as needed to titrate to normal stool consistency.  Suspect viral gastroenteritis versus IBS flare.  Patient given strict return precautions.  She will return if she has any fevers, worsening pain, migration of pain to right lower quadrant or is unable to tolerate p.o.  She had no episodes of emesis during ED stay.  I discussed this case with my attending physician who cosigned this note including patient's presenting symptoms, physical exam, and planned  diagnostics and interventions. Attending physician stated agreement with plan or made changes to plan which were implemented.   Attending physician assessed patient at bedside.  Gabriela Shepard was evaluated in Emergency Department on 09/26/2019 for the symptoms described in the history of present illness. She was evaluated in the context of the global COVID-19 pandemic, which necessitated consideration that the patient might be at risk for infection with the SARS-CoV-2 virus that causes COVID-19. Institutional protocols and algorithms that pertain to the evaluation of patients at risk for COVID-19 are in a state of rapid change based on information released by regulatory bodies including the CDC and federal and state organizations. These policies and algorithms were followed during the patient's care in the ED.  The medical records were personally reviewed by myself. I personally reviewed all lab results and interpreted all imaging studies and either concurred with their official read or contacted radiology for clarification. Additional history obtained from old records  This patient appears reasonably screened and I doubt any other medical condition requiring further workup, evaluation, or treatment in the ED at this time prior to discharge.   Patient's vitals are WNL apart from vital sign abnormalities discussed above, patient is in NAD, and able to ambulate in the ED at their baseline and able to tolerate PO.  Pain has been managed or a plan has been made for home management and has no complaints prior to discharge. Patient is comfortable with above plan and for discharge at this time. All questions were answered prior to disposition. Results from the ER workup discussed with the patient face to face and all questions answered to the best of my ability. The patient is safe for discharge with strict return precautions. Patient appears safe for discharge with appropriate follow-up. Conveyed my  impression with the patient and they voiced understanding and are agreeable to plan.   An After Visit Summary was printed and given to the patient.  Portions of this note were generated with [employer]. Dictation errors may occur despite best attempts at proofreading.    MDM Rules/Calculators/A&P                      Final Clinical Impression(s) / ED Diagnoses Final diagnoses:  Epigastric pain  Nausea vomiting and diarrhea    Rx / DC Orders ED Discharge Orders         Ordered    dicyclomine (BENTYL) 20 MG tablet  2 times daily     09/26/19 1507    ondansetron (ZOFRAN ODT) 4 MG disintegrating tablet  Every 8 hours PRN     09/26/19 1507           Dalila Arca, [place], [place]  09/26/19 1523    Hayden Rasmussen, MD 09/26/19 1800

## 2019-09-26 NOTE — Telephone Encounter (Signed)
[facility]era nurse from triage wanted to report that pt refused to go to ER for the abdominal pain and black stool that she is experiencing.

## 2019-09-26 NOTE — Discharge Instructions (Signed)
Your evaluation today was reassuring and that there is no indication that you have a surgical emergency occurring.  Your stool shows no evidence of blood, most likely aspiration.  Dark stool was the Pepto-Bismol that you are taking.  Please use Zofran as needed for nausea.  Please drink plenty of fluids.  Water, Gatorade, Pedialyte be ideal.  Please use MiraLAX and prunes as needed for constipation.  Please eat and drink plenty of fluids--pedialyte has sodium in it.

## 2019-09-26 NOTE — Telephone Encounter (Signed)
Spoke with pt and advised that she should not wait for 5 clock appt and go to ER as nurse triage has directed pt states she will consider going to ER and to go ahead and cancel 5 clock appt

## 2019-10-02 ENCOUNTER — Other Ambulatory Visit: Payer: Self-pay | Admitting: Family Medicine

## 2019-10-18 ENCOUNTER — Encounter: Payer: Self-pay | Admitting: Family Medicine

## 2019-10-19 ENCOUNTER — Telehealth: Payer: Self-pay | Admitting: Family Medicine

## 2019-10-19 MED ORDER — CLONAZEPAM 1 MG PO TABS: ORAL_TABLET | ORAL | 0 refills | Status: DC

## 2019-10-19 NOTE — Telephone Encounter (Signed)
Lvm letting pt know pharmacy has been called

## 2019-10-19 NOTE — Telephone Encounter (Signed)
Pt is calling in stating that she need to have the office call the pharmacy to call and let them know that there has been a change in therapy and the pt can get the medication early.  Pt is aware that the provider is in with pts and they will get to it within the end of the day.

## 2019-10-19 NOTE — Telephone Encounter (Signed)
I sent an early refill of Klonopin to her pharmacy with note that we are approving early refill

## 2019-10-19 NOTE — Telephone Encounter (Signed)
Called pharmacy and they said they would ger it ready

## 2019-10-22 NOTE — Telephone Encounter (Signed)
See phone note this has already been handled

## 2019-10-29 ENCOUNTER — Other Ambulatory Visit: Payer: Self-pay | Admitting: Family Medicine

## 2019-11-08 ENCOUNTER — Encounter: Payer: Self-pay | Admitting: Family Medicine

## 2019-11-11 MED ORDER — POLYMYXIN B-TRIMETHOPRIM 10000-0.1 UNIT/ML-% OP SOLN: 2.0000 [drp] | OPHTHALMIC | 0 refills | Status: DC

## 2019-11-11 NOTE — Telephone Encounter (Signed)
I sent in rx for polytrim drops to her pharmacy.

## 2019-11-13 DIAGNOSIS — F4312 Post-traumatic stress disorder, chronic: Secondary | ICD-10-CM | POA: Diagnosis not present

## 2019-11-13 DIAGNOSIS — Z03818 Encounter for observation for suspected exposure to other biological agents ruled out: Secondary | ICD-10-CM | POA: Diagnosis not present

## 2019-11-13 DIAGNOSIS — Z20822 Contact with and (suspected) exposure to covid-19: Secondary | ICD-10-CM | POA: Diagnosis not present

## 2019-11-16 ENCOUNTER — Other Ambulatory Visit: Payer: Self-pay | Admitting: Family Medicine

## 2019-12-05 ENCOUNTER — Other Ambulatory Visit: Payer: Self-pay | Admitting: Family Medicine

## 2019-12-07 ENCOUNTER — Other Ambulatory Visit: Payer: Self-pay | Admitting: Family Medicine

## 2019-12-12 DIAGNOSIS — M542 Cervicalgia: Secondary | ICD-10-CM | POA: Diagnosis not present

## 2019-12-12 DIAGNOSIS — M25551 Pain in right hip: Secondary | ICD-10-CM | POA: Diagnosis not present

## 2019-12-12 DIAGNOSIS — M79641 Pain in right hand: Secondary | ICD-10-CM | POA: Diagnosis not present

## 2019-12-25 DIAGNOSIS — F4312 Post-traumatic stress disorder, chronic: Secondary | ICD-10-CM | POA: Diagnosis not present

## 2020-01-02 DIAGNOSIS — F4312 Post-traumatic stress disorder, chronic: Secondary | ICD-10-CM | POA: Diagnosis not present

## 2020-01-08 DIAGNOSIS — F4312 Post-traumatic stress disorder, chronic: Secondary | ICD-10-CM | POA: Diagnosis not present

## 2020-01-15 DIAGNOSIS — F4312 Post-traumatic stress disorder, chronic: Secondary | ICD-10-CM | POA: Diagnosis not present

## 2020-01-24 DIAGNOSIS — H00024 Hordeolum internum left upper eyelid: Secondary | ICD-10-CM | POA: Diagnosis not present

## 2020-01-27 ENCOUNTER — Other Ambulatory Visit: Payer: Self-pay | Admitting: Family Medicine

## 2020-01-28 DIAGNOSIS — H00024 Hordeolum internum left upper eyelid: Secondary | ICD-10-CM | POA: Diagnosis not present

## 2020-01-28 NOTE — Telephone Encounter (Signed)
Patient need to schedule an ov for more refills. 

## 2020-02-12 ENCOUNTER — Other Ambulatory Visit: Payer: Self-pay | Admitting: Family Medicine

## 2020-02-12 ENCOUNTER — Encounter: Payer: Self-pay | Admitting: Family Medicine

## 2020-02-14 ENCOUNTER — Encounter: Payer: Self-pay | Admitting: Family Medicine

## 2020-02-14 ENCOUNTER — Other Ambulatory Visit: Payer: Self-pay | Admitting: Family Medicine

## 2020-02-14 MED ORDER — VENLAFAXINE HCL ER 150 MG PO TB24: 1.0000 | ORAL_TABLET | Freq: Every day | ORAL | 3 refills | Status: DC

## 2020-02-15 NOTE — Telephone Encounter (Signed)
Please advise 

## 2020-02-16 MED ORDER — CLOTRIMAZOLE-BETAMETHASONE 1-0.05 % EX CREA: 1.0000 "application " | TOPICAL_CREAM | Freq: Two times a day (BID) | CUTANEOUS | 2 refills | Status: DC

## 2020-02-16 NOTE — Telephone Encounter (Signed)
I refilled the Klonopin and also the clotrimazole-betamethasone cream.

## 2020-02-29 DIAGNOSIS — F4312 Post-traumatic stress disorder, chronic: Secondary | ICD-10-CM | POA: Diagnosis not present

## 2020-03-03 DIAGNOSIS — F4312 Post-traumatic stress disorder, chronic: Secondary | ICD-10-CM | POA: Diagnosis not present

## 2020-03-12 DIAGNOSIS — F4312 Post-traumatic stress disorder, chronic: Secondary | ICD-10-CM | POA: Diagnosis not present

## 2020-03-17 DIAGNOSIS — F4312 Post-traumatic stress disorder, chronic: Secondary | ICD-10-CM | POA: Diagnosis not present

## 2020-03-24 DIAGNOSIS — F4312 Post-traumatic stress disorder, chronic: Secondary | ICD-10-CM | POA: Diagnosis not present

## 2020-03-27 DIAGNOSIS — F4312 Post-traumatic stress disorder, chronic: Secondary | ICD-10-CM | POA: Diagnosis not present

## 2020-04-03 DIAGNOSIS — F4312 Post-traumatic stress disorder, chronic: Secondary | ICD-10-CM | POA: Diagnosis not present

## 2020-04-11 DIAGNOSIS — F4312 Post-traumatic stress disorder, chronic: Secondary | ICD-10-CM | POA: Diagnosis not present

## 2020-04-16 ENCOUNTER — Other Ambulatory Visit: Payer: Self-pay | Admitting: Family Medicine

## 2020-04-17 DIAGNOSIS — F4312 Post-traumatic stress disorder, chronic: Secondary | ICD-10-CM | POA: Diagnosis not present

## 2020-04-17 NOTE — Telephone Encounter (Signed)
clonazePAM (KLONOPIN) 1 MG tablet Last refill 02/16/20 Last virtual visit 09/05/19

## 2020-04-24 DIAGNOSIS — F4312 Post-traumatic stress disorder, chronic: Secondary | ICD-10-CM | POA: Diagnosis not present

## 2020-04-30 DIAGNOSIS — F4312 Post-traumatic stress disorder, chronic: Secondary | ICD-10-CM | POA: Diagnosis not present

## 2020-05-08 ENCOUNTER — Ambulatory Visit: Payer: BC Managed Care – PPO | Admitting: Internal Medicine

## 2020-05-09 DIAGNOSIS — F4312 Post-traumatic stress disorder, chronic: Secondary | ICD-10-CM | POA: Diagnosis not present

## 2020-05-16 DIAGNOSIS — F4312 Post-traumatic stress disorder, chronic: Secondary | ICD-10-CM | POA: Diagnosis not present

## 2020-05-22 DIAGNOSIS — F4312 Post-traumatic stress disorder, chronic: Secondary | ICD-10-CM | POA: Diagnosis not present

## 2020-05-26 DIAGNOSIS — F4312 Post-traumatic stress disorder, chronic: Secondary | ICD-10-CM | POA: Diagnosis not present

## 2020-05-28 ENCOUNTER — Ambulatory Visit: Payer: BC Managed Care – PPO | Admitting: Obstetrics and Gynecology

## 2020-05-28 ENCOUNTER — Other Ambulatory Visit: Payer: Self-pay

## 2020-05-28 ENCOUNTER — Encounter: Payer: Self-pay | Admitting: Obstetrics and Gynecology

## 2020-05-28 ENCOUNTER — Telehealth: Payer: Self-pay

## 2020-05-28 ENCOUNTER — Encounter: Payer: Self-pay | Admitting: Family Medicine

## 2020-05-28 VITALS — BP 122/63 | Temp 97.8°F | Ht 64.5 in | Wt 151.0 lb

## 2020-05-28 DIAGNOSIS — N3090 Cystitis, unspecified without hematuria: Secondary | ICD-10-CM

## 2020-05-28 LAB — POCT URINALYSIS DIPSTICK
Bilirubin, UA: NEGATIVE
Blood, UA: NEGATIVE
Glucose, UA: NEGATIVE
Ketones, UA: NEGATIVE
Nitrite, UA: NEGATIVE
Protein, UA: NEGATIVE
Urobilinogen, UA: NEGATIVE E.U./dL — AB
pH, UA: 7 (ref 5.0–8.0)

## 2020-05-28 MED ORDER — PHENAZOPYRIDINE HCL 200 MG PO TABS: 200.0000 mg | ORAL_TABLET | Freq: Three times a day (TID) | ORAL | 0 refills | Status: DC | PRN

## 2020-05-28 MED ORDER — NITROFURANTOIN MONOHYD MACRO 100 MG PO CAPS: 100.0000 mg | ORAL_CAPSULE | Freq: Two times a day (BID) | ORAL | 0 refills | Status: DC

## 2020-05-28 NOTE — Progress Notes (Signed)
GYNECOLOGY  VISIT   HPI: 59 y.o.   Divorced White or Caucasian Not Hispanic or Latino  female   [phone] with Patient's last menstrual period was 06/07/2010 (approximate).   here for UTI symptoms x 12 days. C/o urinary frequency, urinary urgency and dysuria. Voiding small amounts. No fever, no flank pain.  GYNECOLOGIC HISTORY: Patient's last menstrual period was 06/07/2010 (approximate). Contraception:PMP Menopausal hormone therapy: none        OB History    Gravida  2   Para  2   Term  2   Preterm      AB      Living  2     SAB      IAB      Ectopic      Multiple      Live Births  2              Patient Active Problem List   Diagnosis Date Noted  . Age-related osteoporosis without current pathological fracture 08/06/2019  . Hyponatremia 08/06/2019  . Multiple thyroid nodules 08/06/2019  . 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 Piriformis syndrome, right 08/09/2018  . Weight gain 02/08/2018  . Trigger point of right shoulder region 12/22/2017  . Cervical radiculopathy at C6 11/29/2017  . Osteopenia 03/09/2016  . Primary osteoarthritis of left knee 12/23/2015  . IBS (irritable bowel syndrome) 02/19/2015  . Postmenopausal 12/19/2013  . Migraine headache 12/05/2012  . Pain, upper back 09/24/2010  . Depression 09/04/2010    Past Medical History:  Diagnosis Date  . Arthritis   . Bowel habit changes    been going on couple years  . Depression   . Flatulence    excessive with strong odor/uncontrollable  . Headache   . 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 History of UTI   . Migraines   . PONV (postoperative nausea and vomiting)   . PTSD (post-traumatic stress disorder)   . STD (sexually transmitted disease)   . Substance abuse ([place])   . Urine incontinence     Past Surgical History:  Procedure Laterality Date  . BREAST BIOPSY Left    2017 benign x 2  . CESAREAN SECTION  1992  . COLONOSCOPY    . INCONTINENCE SURGERY     . LAPAROSCOPY     under bladder  . NECK SURGERY  02/24/2018   ACDF  . TOTAL HIP ARTHROPLASTY     left  . TOTAL KNEE ARTHROPLASTY Left 12/23/2015   Procedure: TOTAL KNEE ARTHROPLASTY;  Surgeon: Melrose Nakayama, MD;  Location: [place];  Service: Orthopedics;  Laterality: Left;    Current Outpatient Medications  Medication Sig Dispense Refill  . AIMOVIG 70 MG/ML SOAJ INJECT 70 MG INTO THE SKIN EVERY 30 (THIRTY) DAYS. (Patient taking differently: Inject 70 mg as directed every 30 (thirty) days.) 6 pen 11  . Cholecalciferol (VITAMIN D3) 1.25 MG (50000 UT) CAPS Take 1 capsule by mouth once a week. (Patient taking differently: Take 50,000 Units by mouth once a week.) 12 capsule 3  . clonazePAM (KLONOPIN) 1 MG tablet TAKE ONE TABLET BY MOUTH TWICE DAILY AS NEEDED FOR SEVERE ANXIETY SYMPTOMS 90 tablet 0  . clotrimazole-betamethasone (LOTRISONE) cream APPLY TO AFFECTED AREA TWICE A DAY 30 g 2  . esomeprazole (NEXIUM) 40 MG capsule TAKE 1 CAPSULE BY MOUTH EVERY DAY 90 capsule 3  . hydrOXYzine (ATARAX/VISTARIL) 10 MG tablet TAKE 1 TABLET (10 MG TOTAL) BY MOUTH 3 (THREE)  TIMES DAILY AS NEEDED FOR ANXIETY. 270 tablet 1  . Multiple Vitamin (MULTIVITAMIN WITH MINERALS) TABS tablet Take 1 tablet by mouth daily.    . ondansetron (ZOFRAN ODT) 4 MG disintegrating tablet Take 1 tablet (4 mg total) by mouth every 8 (eight) hours as needed for nausea or vomiting. 20 tablet 0  . SUMAtriptan (IMITREX) 100 MG tablet TAKE 1 TABLET BY MOUTH AS NEEDED FOR MIGRAINE HEADACHE, MAY REPEAT ONCE IN TWO HOURS AS NEEDED BUT NO MORE THAN 2 IN 24 HOURS. 9 tablet 4  . tiZANidine (ZANAFLEX) 4 MG capsule TAKE 1 CAPSULE BY MOUTH EVERY DAY IN THE EVENING 90 capsule 1   No current facility-administered medications for this visit.     ALLERGIES: Sulfa antibiotics  Family History  Problem Relation Age of Onset  . Hypertension Mother   . Lung cancer Mother   . Alcohol abuse Other   . Arthritis Other   . Hypertension Other   .  Mental illness Other   . Kidney cancer Father   . Ulcers Father   . Drug abuse Sister   . Hypertension Maternal Grandmother   . Stomach cancer Maternal Uncle   . Colon cancer Neg Hx   . Esophageal cancer Neg Hx     Social History   Socioeconomic History  . Marital status: Divorced    Spouse name: Not on file  . Number of children: Not on file  . Years of education: Not on file  . Highest education level: Not on file  Occupational History  . Not on file  Tobacco Use  . Smoking status: Former Smoker    Packs/day: 0.50    Years: 10.00    Pack years: 5.00    Types: Cigarettes    Quit date: 11/13/2010    Years since quitting: 9.5  . Smokeless tobacco: Never Used  Vaping Use  . Vaping Use: Never used  Substance and Sexual Activity  . Alcohol use: No  . Drug use: No  . Sexual activity: Not Currently    Birth control/protection: Post-menopausal  Other Topics Concern  . Not on file  Social History Narrative  . Not on file   Social Determinants of Health   Financial Resource Strain: Not on file  Food Insecurity: Not on file  Transportation Needs: Not on file  Physical Activity: Not on file  Stress: Not on file  Social Connections: Not on file  Intimate Partner Violence: Not on file    Review of Systems  All other systems reviewed and are negative.   PHYSICAL EXAMINATION:    BP 122/63   Temp 97.8 F (36.6 C)   Ht 5' 4.5" (1.638 m)   Wt 151 lb (68.5 kg)   LMP 06/07/2010 (Approximate)   BMI 25.52 kg/m     General appearance: alert, cooperative and appears stated age Abdomen: soft, mildly tender in the suprapubic region; non distended, no masses,  no organomegaly CVA: not tender  Urine dip: 1+ leuk  ASSESSMENT Cystitis    PLAN Send urine for ua, c&s Treat with macrobid and pyridium

## 2020-05-28 NOTE — Telephone Encounter (Signed)
Patient is having burning and urgency with pain.

## 2020-05-28 NOTE — Patient Instructions (Signed)

## 2020-05-28 NOTE — Telephone Encounter (Signed)
AEX 05/2019 with JJ, next not scheduled  Spoke with pt. Pt states having urinary urgency, frequency, burning and pain x 1 week. Pt denies fever, chills, back pain.  Pt states has not taken any OTC meds for treatment.   Pt advised to have OV for evaluation. Pt agreeable. Pt scheduled today 12/22 at 4 pm. Pt agreeable to date and time of appt.  Encounter closed

## 2020-05-29 LAB — URINALYSIS, MICROSCOPIC ONLY
Bacteria, UA: NONE SEEN
Casts: NONE SEEN /lpf
Epithelial Cells (non renal): NONE SEEN /hpf (ref 0–10)
RBC, Urine: NONE SEEN /hpf (ref 0–2)
WBC, UA: NONE SEEN /hpf (ref 0–5)

## 2020-05-30 LAB — URINE CULTURE

## 2020-05-31 ENCOUNTER — Encounter: Payer: Self-pay | Admitting: Obstetrics and Gynecology

## 2020-06-01 ENCOUNTER — Encounter: Payer: Self-pay | Admitting: Obstetrics and Gynecology

## 2020-06-02 ENCOUNTER — Encounter: Payer: Self-pay | Admitting: Family Medicine

## 2020-06-02 ENCOUNTER — Ambulatory Visit: Payer: BC Managed Care – PPO | Admitting: Family Medicine

## 2020-06-02 ENCOUNTER — Telehealth: Payer: Self-pay

## 2020-06-02 DIAGNOSIS — N3090 Cystitis, unspecified without hematuria: Secondary | ICD-10-CM

## 2020-06-02 DIAGNOSIS — R3915 Urgency of urination: Secondary | ICD-10-CM

## 2020-06-02 DIAGNOSIS — R350 Frequency of micturition: Secondary | ICD-10-CM

## 2020-06-02 NOTE — Telephone Encounter (Signed)
Reviewed 05/28/20 OV notes and results.   Spoke with patient. Patient reports continued burning with urination, voiding frequent, small amounts of urine, lower abdominal discomfort, requesting referral to urology. Denies any other symptoms. Symptoms not relieved by pyridium.   Patient has seen Dr. Annabell Howells in the past, request first available provider at [facility]. Urgent Urology referral placed. Advised patient their office will contact her directly to schedule. Advised I will update Dr. Oscar La and return call if any additional recommendations, patient agreeable.   Routing to Dr. Oscar La for final review.

## 2020-06-02 NOTE — Telephone Encounter (Signed)
I agree with the plan.

## 2020-06-02 NOTE — Telephone Encounter (Signed)
Gabriela Shepard. I am so uncomfortable. Haven't slept at all. I looked at the culture results and it doesn't appear antibiotics going to help. (But I don't know).Marland KitchenMarland KitchenI don't know what to do. I'm just miserable. I've been struggling with it for a while now and seems to be getting worst. Please advise.  [name]

## 2020-06-02 NOTE — Telephone Encounter (Signed)
Patient called to check status of needing a referral.

## 2020-06-03 ENCOUNTER — Other Ambulatory Visit: Payer: Self-pay

## 2020-06-04 ENCOUNTER — Encounter: Payer: Self-pay | Admitting: Family Medicine

## 2020-06-04 ENCOUNTER — Telehealth: Payer: Self-pay | Admitting: Family Medicine

## 2020-06-04 ENCOUNTER — Ambulatory Visit: Payer: BC Managed Care – PPO | Admitting: Family Medicine

## 2020-06-04 VITALS — BP 120/70 | HR 68 | Ht 64.5 in | Wt 150.0 lb

## 2020-06-04 DIAGNOSIS — R300 Dysuria: Secondary | ICD-10-CM | POA: Diagnosis not present

## 2020-06-04 DIAGNOSIS — F419 Anxiety disorder, unspecified: Secondary | ICD-10-CM | POA: Diagnosis not present

## 2020-06-04 DIAGNOSIS — R3915 Urgency of urination: Secondary | ICD-10-CM | POA: Diagnosis not present

## 2020-06-04 MED ORDER — BUSPIRONE HCL 7.5 MG PO TABS: 7.5000 mg | ORAL_TABLET | Freq: Two times a day (BID) | ORAL | 2 refills | Status: DC

## 2020-06-04 MED ORDER — MIRABEGRON ER 25 MG PO TB24: 25.0000 mg | ORAL_TABLET | Freq: Every day | ORAL | 5 refills | Status: DC

## 2020-06-04 NOTE — Progress Notes (Signed)
prior authorization submitted via covermymeds.  Waiting response. (Key: [id number])  Your information has been submitted to [employer]. [employer] is reviewing the PA request and you will receive an electronic response. You may check for the updated outcome later by reopening this request. The standard fax determination will also be sent to you directly.  If you have any questions about your PA submission, contact [employer] at [phone].

## 2020-06-04 NOTE — Patient Instructions (Signed)
Managing Stress, Adult Feeling a certain amount of stress is normal. Stress helps our body and mind get ready to deal with the demands of life. Stress hormones can motivate you to do well at work and meet your responsibilities. However severe or long-lasting (chronic) stress can affect your mental and physical health. Chronic stress puts you at higher risk for anxiety, depression, and other health problems like digestive problems, muscle aches, heart disease, high blood pressure, and stroke. What are the causes? Common causes of stress include:  Demands from work, such as deadlines, feeling overworked, or having long hours.  Pressures at home, such as money issues, disagreements with a spouse, or parenting issues.  Pressures from major life changes, such as divorce, moving, loss of a loved one, or chronic illness. You may be at higher risk for stress-related problems if you do not get enough sleep, are in poor health, do not have emotional support, or have a mental health disorder like anxiety or depression. How to recognize stress Stress can make you:  Have trouble sleeping.  Feel sad, anxious, irritable, or overwhelmed.  Lose your appetite.  Overeat or want to eat unhealthy foods.  Want to use drugs or alcohol. Stress can also cause physical symptoms, such as:  Sore, tense muscles, especially in the shoulders and neck.  Headaches.  Trouble breathing.  A faster heart rate.  Stomach pain, nausea, or vomiting.  Diarrhea or constipation.  Trouble concentrating. Follow these instructions at home: Lifestyle  Identify the source of your stress and your reaction to it. See a therapist who can help you change your reactions.  When there are stressful events: ? Talk about it with family, friends, or co-workers. ? Try to think realistically about stressful events and not ignore them or overreact. ? Try to find the positives in a stressful situation and not focus on the  negatives. ? Cut back on responsibilities at work and home, if possible. Ask for help from friends or family members if you need it.  Find ways to cope with stress, such as: ? Meditation. ? Deep breathing. ? Yoga or tai chi. ? Progressive muscle relaxation. ? Doing art, playing music, or reading. ? Making time for fun activities. ? Spending time with family and friends.  Get support from family, friends, or spiritual resources. Eating and drinking  Eat a healthy diet. This includes: ? Eating foods that are high in fiber, such as beans, whole grains, and fresh fruits and vegetables. ? Limiting foods that are high in fat and processed sugars, such as fried and sweet foods.  Do not skip meals or overeat.  Drink enough fluid to keep your urine pale yellow. Alcohol use  Do not drink alcohol if: ? Your health care provider tells you not to drink. ? You are pregnant, may be pregnant, or are planning to become pregnant.  Drinking alcohol is a way some people try to ease their stress. This can be dangerous, so if you drink alcohol: ? Limit how much you use to:  0-1 drink a day for women.  0-2 drinks a day for men. ? Be aware of how much alcohol is in your drink. In the [place] one drink equals one 12 oz bottle of beer (355 mL), one 5 oz glass of wine (148 mL), or one 1 oz glass of hard liquor (44 mL). Activity   Include 30 minutes of exercise in your daily schedule. Exercise is a good stress reducer.  Include time in your day   for an activity that you find relaxing. Try taking a walk, going on a bike ride, reading a book, or listening to music.  Schedule your time in a way that lowers stress, and keep a consistent schedule. Prioritize what is most important to get done. General instructions  Get enough sleep. Try to go to sleep and get up at about the same time every day.  Take over-the-counter and prescription medicines only as told by your health care provider.  Do not use any  products that contain nicotine or tobacco, such as cigarettes, e-cigarettes, and chewing tobacco. If you need help quitting, ask your health care provider.  Do not use drugs or smoke to cope with stress.  Keep all follow-up visits as told by your health care provider. This is important. Where to find support  Talk with your health care provider about stress management or finding a support group.  Find a therapist to work with you on your stress management techniques. Contact a health care provider if:  Your stress symptoms get worse.  You are unable to manage your stress at home.  You are struggling to stop using drugs or alcohol. Get help right away if:  You may be a danger to yourself or others.  You have any thoughts of death or suicide. If you ever feel like you may hurt yourself or others, or have thoughts about taking your own life, get help right away. You can go to your nearest emergency department or call:  Your local emergency services (911 in the [place]  A suicide crisis helpline, such as the [place] at 786-499-0900. This is open 24 hours a day. Summary  Feeling a certain amount of stress is normal, but severe or long-lasting (chronic) stress can affect your mental and physical health.  Chronic stress can put you at higher risk for anxiety, depression, and other health problems like digestive problems, muscle aches, heart disease, high blood pressure, and stroke.  You may be at higher risk for stress-related problems if you do not get enough sleep, are in poor health, lack emotional support, or have a mental health disorder like anxiety or depression.  Identify the source of your stress and your reaction to it. Try talking about stressful events with family, friends, or co-workers, finding a coping method, or getting support from spiritual resources.  If you need more help, talk with your health care provider about finding a support group  or a mental health therapist. This information is not intended to replace advice given to you by your health care provider. Make sure you discuss any questions you have with your health care provider. Document Revised: 12/20/2018 Document Reviewed: 12/20/2018 Elsevier Patient Education  [place].

## 2020-06-04 NOTE — Telephone Encounter (Signed)
Discontinue Myrbetriq and send in Vesicare 5 mg p.o. nightly #30 with 2 refills.

## 2020-06-04 NOTE — Telephone Encounter (Signed)
Asked patient what alternatives she was given but she said that the medication just cost to much.

## 2020-06-04 NOTE — Progress Notes (Signed)
Established Patient Office Visit  Subjective:  Patient ID: Gabriela Shepard, female    DOB: 05-Nov-1960  Age: 59 y.o. MRN: [id number]  CC:  Chief Complaint  Patient presents with  . bladder concern    HPI [name] presents for persistent urinary symptoms.  She recently had increased burning with urination and saw her gynecologist.  She was placed on antibiotic and urine culture was sent which came back negative.  She had at least a few cultures in recent years which have all been negative.  She states she is having significant urgency and sometimes gets up 9-10 times at night.  She is all but limited caffeine.  No stress incontinence.  She states that when she goes the bathroom she frequently has low volume but has extreme urgency.  Her bigger concern that was the burning that occurs more persistently in recent weeks.  She is concerned specifically about possibility of interstitial cystitis.  Her GYN has prescribed her topical estrogen in the past but she does not use this regularly.  She has taken over-the-counter Pyridium which has not helped with her symptoms.  Increased stress and anxiety issues.  She had some chronic anxiety for some time and recently weaned herself off Effexor.  She is in state of transition with the fact that her job is currently going away.  This is very stressful for her as she looks to finding a new job.  She has been taking Klonopin and inquires about possible other medication options for anxiety.  She is also tried low-dose hydroxyzine with minimal if any help.  She feels that she has more anxiety than depression at this time.  She does have past history of alcohol abuse but has been abstinent for several years now  Past Medical History:  Diagnosis Date  . Arthritis   . Bowel habit changes    been going on couple years  . Depression   . Flatulence    excessive with strong odor/uncontrollable  . Headache   . History of alcohol abuse    five years sober  as of 2017  . History of UTI   . Migraines   . PONV (postoperative nausea and vomiting)   . PTSD (post-traumatic stress disorder)   . STD (sexually transmitted disease)   . Substance abuse ([place])   . Urine incontinence     Past Surgical History:  Procedure Laterality Date  . BREAST BIOPSY Left    2017 benign x 2  . CESAREAN SECTION  1992  . COLONOSCOPY    . INCONTINENCE SURGERY    . LAPAROSCOPY     under bladder  . NECK SURGERY  02/24/2018   ACDF  . TOTAL HIP ARTHROPLASTY     left  . TOTAL KNEE ARTHROPLASTY Left 12/23/2015   Procedure: TOTAL KNEE ARTHROPLASTY;  Surgeon: Melrose Nakayama, MD;  Location: [place];  Service: Orthopedics;  Laterality: Left;    Family History  Problem Relation Age of Onset  . Hypertension Mother   . Lung cancer Mother   . Alcohol abuse Other   . Arthritis Other   . Hypertension Other   . Mental illness Other   . Kidney cancer Father   . Ulcers Father   . Drug abuse Sister   . Hypertension Maternal Grandmother   . Stomach cancer Maternal Uncle   . Colon cancer Neg Hx   . Esophageal cancer Neg Hx     Social History   Socioeconomic History  . Marital  status: Divorced    Spouse name: Not on file  . Number of children: Not on file  . Years of education: Not on file  . Highest education level: Not on file  Occupational History  . Not on file  Tobacco Use  . Smoking status: Former Smoker    Packs/day: 0.50    Years: 10.00    Pack years: 5.00    Types: Cigarettes    Quit date: 11/13/2010    Years since quitting: 9.5  . Smokeless tobacco: Never Used  Vaping Use  . Vaping Use: Never used  Substance and Sexual Activity  . Alcohol use: No  . Drug use: No  . Sexual activity: Not Currently    Birth control/protection: Post-menopausal  Other Topics Concern  . Not on file  Social History Narrative  . Not on file   Social Determinants of Health   Financial Resource Strain: Not on file  Food Insecurity: Not on file  Transportation  Needs: Not on file  Physical Activity: Not on file  Stress: Not on file  Social Connections: Not on file  Intimate Partner Violence: Not on file    Outpatient Medications Prior to Visit  Medication Sig Dispense Refill  . AIMOVIG 70 MG/ML SOAJ INJECT 70 MG INTO THE SKIN EVERY 30 (THIRTY) DAYS. (Patient taking differently: Inject 70 mg as directed every 30 (thirty) days.) 6 pen 11  . Cholecalciferol (VITAMIN D3) 1.25 MG (50000 UT) CAPS Take 1 capsule by mouth once a week. (Patient taking differently: Take 50,000 Units by mouth once a week.) 12 capsule 3  . clonazePAM (KLONOPIN) 1 MG tablet TAKE ONE TABLET BY MOUTH TWICE DAILY AS NEEDED FOR SEVERE ANXIETY SYMPTOMS 90 tablet 0  . clotrimazole-betamethasone (LOTRISONE) cream APPLY TO AFFECTED AREA TWICE A DAY 30 g 2  . esomeprazole (NEXIUM) 40 MG capsule TAKE 1 CAPSULE BY MOUTH EVERY DAY 90 capsule 3  . hydrOXYzine (ATARAX/VISTARIL) 10 MG tablet TAKE 1 TABLET (10 MG TOTAL) BY MOUTH 3 (THREE) TIMES DAILY AS NEEDED FOR ANXIETY. 270 tablet 1  . Multiple Vitamin (MULTIVITAMIN WITH MINERALS) TABS tablet Take 1 tablet by mouth daily.    . ondansetron (ZOFRAN ODT) 4 MG disintegrating tablet Take 1 tablet (4 mg total) by mouth every 8 (eight) hours as needed for nausea or vomiting. 20 tablet 0  . phenazopyridine (PYRIDIUM) 200 MG tablet Take 1 tablet (200 mg total) by mouth 3 (three) times daily as needed. 6 tablet 0  . tiZANidine (ZANAFLEX) 4 MG capsule TAKE 1 CAPSULE BY MOUTH EVERY DAY IN THE EVENING 90 capsule 1  . nitrofurantoin, macrocrystal-monohydrate, (MACROBID) 100 MG capsule Take 1 capsule (100 mg total) by mouth 2 (two) times daily. 10 capsule 0  . SUMAtriptan (IMITREX) 100 MG tablet TAKE 1 TABLET BY MOUTH AS NEEDED FOR MIGRAINE HEADACHE, MAY REPEAT ONCE IN TWO HOURS AS NEEDED BUT NO MORE THAN 2 IN 24 HOURS. 9 tablet 4   No facility-administered medications prior to visit.    Allergies  Allergen Reactions  . Sulfa Antibiotics Hives     ROS Review of Systems  Constitutional: Negative for chills, fever and unexpected weight change.  Genitourinary: Positive for dysuria, frequency and urgency. Negative for flank pain, hematuria and vaginal discharge.  Psychiatric/Behavioral: Positive for sleep disturbance. The patient is nervous/anxious.       Objective:    Physical Exam Vitals reviewed.  Constitutional:      Appearance: Normal appearance.  Cardiovascular:     Rate and Rhythm:  Normal rate and regular rhythm.  Pulmonary:     Effort: Pulmonary effort is normal.     Breath sounds: Normal breath sounds.  Neurological:     Mental Status: She is alert.     BP 120/70   Pulse 68   Ht 5' 4.5" (1.638 m)   Wt 150 lb (68 kg)   LMP 06/07/2010 (Approximate)   SpO2 99%   BMI 25.35 kg/m  Wt Readings from Last 3 Encounters:  06/04/20 150 lb (68 kg)  05/28/20 151 lb (68.5 kg)  08/06/19 150 lb (68 kg)     Health Maintenance Due  Topic Date Due  . COVID-19 Vaccine (1) Never done  . HIV Screening  Never done  . COLONOSCOPY (Pts 45-68yrs Insurance coverage will need to be confirmed)  05/03/2020    There are no preventive care reminders to display for this patient.  Lab Results  Component Value Date   TSH 1.85 08/15/2019   Lab Results  Component Value Date   WBC 7.2 09/26/2019   HGB 14.8 09/26/2019   HCT 44.8 09/26/2019   MCV 89.2 09/26/2019   PLT 382 09/26/2019   Lab Results  Component Value Date   NA 133 (L) 09/26/2019   K 4.4 09/26/2019   CO2 25 09/26/2019   GLUCOSE 122 (H) 09/26/2019   BUN 8 09/26/2019   CREATININE 0.73 09/26/2019   BILITOT 0.5 09/26/2019   ALKPHOS 46 09/26/2019   AST 45 (H) 09/26/2019   ALT 41 09/26/2019   PROT 8.2 (H) 09/26/2019   ALBUMIN 4.3 09/26/2019   CALCIUM 9.2 09/26/2019   ANIONGAP 12 09/26/2019   GFR 81.70 08/06/2019   Lab Results  Component Value Date   CHOL 239 (H) 04/30/2019   Lab Results  Component Value Date   HDL 65.20 04/30/2019   Lab Results   Component Value Date   LDLCALC 148 (H) 04/30/2019   Lab Results  Component Value Date   TRIG 128.0 04/30/2019   Lab Results  Component Value Date   CHOLHDL 4 04/30/2019   Lab Results  Component Value Date   HGBA1C 5.9 05/02/2017      Assessment & Plan:   #1 persistent dysuria.  Recent urine culture negative.  We discussed other possible factors such as atrophic vaginitis but she feels like her pain is more anterior involving the bladder and not involving the vagina or urethra region.  She is concerned specifically about possible interstitial cystitis  -Stay well-hydrated -Continue to avoid caffeine -Urology referral has been made and she has pending appointment for January -We discussed possible trial of Myrbetriq 25 mg once daily for urgency symptoms. -If insurance does not cover the Myrbetriq consider trial of anticholinergic such as Vesicare.  Obviously, if she does prove to have interstitial cystitis may benefit from medication such as low-dose tricyclic but will defer to urology -Consider getting back on topical estrogen which her GYN has prescribed previously but agree that this does not sound like strictly atrophic vaginitis.  #2 chronic anxiety.  She has significant situational anxiety.  -Avoid escalation in benzodiazepines with her past history especially. -We discussed nonpharmacologic management with things like exercise as much as possible -We discussed trial of low-dose BuSpar 7.5 mg twice daily and if no improvement in 1 week increase to 15 mg twice daily -We could consider going back with low-dose SSRI if no relief with the above  Meds ordered this encounter  Medications  . mirabegron ER (MYRBETRIQ) 25 MG TB24 tablet  Sig: Take 1 tablet (25 mg total) by mouth daily.    Dispense:  30 tablet    Refill:  5  . busPIRone (BUSPAR) 7.5 MG tablet    Sig: Take 1 tablet (7.5 mg total) by mouth 2 (two) times daily.    Dispense:  60 tablet    Refill:  2     Follow-up: No follow-ups on file.    Carolann Littler, MD

## 2020-06-04 NOTE — Telephone Encounter (Signed)
Pt is calling in stating that the insurance does not cover Rx mirabegron ER (MYRBETRIQ) 25 MG and would like to see if you can see send something in that she can afford.  Pharm:  [facility]

## 2020-06-05 MED ORDER — OXYBUTYNIN CHLORIDE 5 MG PO TABS: 5.0000 mg | ORAL_TABLET | Freq: Two times a day (BID) | ORAL | 3 refills | Status: DC

## 2020-06-05 NOTE — Telephone Encounter (Signed)
I have sent in Oxybutynin to take 5 mg po bid prn.

## 2020-06-13 DIAGNOSIS — R3915 Urgency of urination: Secondary | ICD-10-CM | POA: Diagnosis not present

## 2020-06-13 DIAGNOSIS — R350 Frequency of micturition: Secondary | ICD-10-CM | POA: Diagnosis not present

## 2020-06-13 DIAGNOSIS — N3010 Interstitial cystitis (chronic) without hematuria: Secondary | ICD-10-CM | POA: Diagnosis not present

## 2020-06-13 DIAGNOSIS — R102 Pelvic and perineal pain: Secondary | ICD-10-CM | POA: Diagnosis not present

## 2020-06-18 DIAGNOSIS — M25551 Pain in right hip: Secondary | ICD-10-CM | POA: Diagnosis not present

## 2020-06-20 ENCOUNTER — Encounter: Payer: Self-pay | Admitting: Family Medicine

## 2020-06-23 ENCOUNTER — Ambulatory Visit: Payer: BC Managed Care – PPO | Admitting: Internal Medicine

## 2020-06-26 ENCOUNTER — Other Ambulatory Visit: Payer: Self-pay | Admitting: Family Medicine

## 2020-06-26 ENCOUNTER — Telehealth (INDEPENDENT_AMBULATORY_CARE_PROVIDER_SITE_OTHER): Payer: BC Managed Care – PPO | Admitting: Family Medicine

## 2020-06-26 ENCOUNTER — Encounter: Payer: Self-pay | Admitting: Family Medicine

## 2020-06-26 DIAGNOSIS — U071 COVID-19: Secondary | ICD-10-CM | POA: Diagnosis not present

## 2020-06-26 MED ORDER — ALBUTEROL SULFATE HFA 108 (90 BASE) MCG/ACT IN AERS: 2.0000 | INHALATION_SPRAY | Freq: Four times a day (QID) | RESPIRATORY_TRACT | 0 refills | Status: DC | PRN

## 2020-06-26 MED ORDER — BENZONATATE 100 MG PO CAPS: 100.0000 mg | ORAL_CAPSULE | Freq: Three times a day (TID) | ORAL | 0 refills | Status: DC | PRN

## 2020-06-26 MED ORDER — DOXYCYCLINE HYCLATE 100 MG PO TABS: 100.0000 mg | ORAL_TABLET | Freq: Two times a day (BID) | ORAL | 0 refills | Status: DC

## 2020-06-26 NOTE — Patient Instructions (Signed)
   ---------------------------------------------------------------------------------------------------------------------------      WORK SLIP:  Patient Gabriela Shepard,  [birth year], was seen for a medical visit today, 06/26/20 . Please excuse from work for a COVID like illness. We advise 10 days minimum from the onset of symptoms (06/17/2020) PLUS 1 day of no fever and improved symptoms. Will defer to employer for a sooner return to work if symptoms have resolved, it is greater than 5 days since the positive test and the patient can wear a high-quality, tight fitting mask such as N95 or KN95 at all times for an additional 5 days. Would also suggest COVID19 antigen testing is negative prior to return.  Sincerely: E-signature: Dr. Colin Benton, DO [place] Ph: [phone]   ------------------------------------------------------------------------------------------------------------------------------   HOME CARE TIPS:  -[place] testing information: https://www.[facility].org/ OR 615-604-5193 Most pharmacies also offer testing and home test kits.  -I sent the medication(s) we discussed to your pharmacy: Meds ordered this encounter  Medications  . doxycycline (VIBRA-TABS) 100 MG tablet    Sig: Take 1 tablet (100 mg total) by mouth 2 (two) times daily.    Dispense:  20 tablet    Refill:  0  . benzonatate (TESSALON PERLES) 100 MG capsule    Sig: Take 1 capsule (100 mg total) by mouth 3 (three) times daily as needed.    Dispense:  20 capsule    Refill:  0  . albuterol (PROAIR HFA) 108 (90 Base) MCG/ACT inhaler    Sig: Inhale 2 puffs into the lungs every 6 (six) hours as needed for wheezing or shortness of breath.    Dispense:  1 each    Refill:  0    -can use tylenol or aleve if needed for fevers, aches and pains per instructions  -can use nasal saline a few times per day if you have nasal congestion; sometimes  a  short course of Afrin nasal spray for 3 days can help with symptoms as well  -stay hydrated, drink plenty of fluids and eat small healthy meals - avoid dairy  -can take 1000 IU (70mcg) Vit D3 and 100-500 mg of Vit C daily per instructions  -If the Covid test is positive, check out the CDC website for more information on home care, transmission and treatment for COVID19  -follow up with your doctor in 2-3 days unless improving and feeling better  -stay home while sick, except to seek medical care, and if you have [place] ideally it would be best to stay home for a full 10 days since the onset of symptoms PLUS one day of no fever and feeling better. Wear a good mask (such as N95 or KN95) if around others to reduce the risk of transmission.  It was nice to meet you today, and I really hope you are feeling better soon. I help [facility] out with telemedicine visits on Tuesdays and Thursdays and am available for visits on those days. If you have any concerns or questions following this visit please schedule a follow up visit with your Primary Care doctor or seek care at a local urgent care clinic to avoid delays in care.    Seek in person care or schedule a follow up video visit promptly if your symptoms worsen, new concerns arise or you are not improving with treatment. Call 911 and/or seek emergency care if your symptoms are severe or life threatening.

## 2020-06-26 NOTE — Progress Notes (Signed)
Virtual Visit via Video Note  I connected with Gabriela Shepard  on 06/26/20 at  5:20 PM EST by a video enabled telemedicine application and verified that I am speaking with the correct person using two identifiers.  Location patient: home, [place] Location provider:work or home office Persons participating in the virtual visit: patient, provider  I discussed the limitations of evaluation and management by telemedicine and the availability of in person appointments. The patient expressed understanding and agreed to proceed.   HPI:  Acute telemedicine visit for COVID19: -Onset: about 9 days ago, had positive covid test 06/20/20 -Symptoms include: nasal congestion, sinus discomfort, fever, body aches, cough, diarrhea initially - now resolved sometimes feels wheezy/tight in chest -Denies:CP, SOB, vomiting, inability to ear/drink/get out of bed -Has tried:imodium, drinking fluid, advil, musinex, nasal saline -Pertinent past medical history: no high risk conditions per pt -Pertinent medication allergies:sulfa -COVID-19 vaccine status: not vaccinated for covid19  ROS: See pertinent positives and negatives per HPI.  Past Medical History:  Diagnosis Date  . Arthritis   . Bowel habit changes    been going on couple years  . Depression   . Flatulence    excessive with strong odor/uncontrollable  . Headache   . 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 History of UTI   . Migraines   . PONV (postoperative nausea and vomiting)   . PTSD (post-traumatic stress disorder)   . STD (sexually transmitted disease)   . Substance abuse ([place])   . Urine incontinence     Past Surgical History:  Procedure Laterality Date  . BREAST BIOPSY Left    2017 benign x 2  . CESAREAN SECTION  1992  . COLONOSCOPY    . INCONTINENCE SURGERY    . LAPAROSCOPY     under bladder  . NECK SURGERY  02/24/2018   ACDF  . TOTAL HIP ARTHROPLASTY     left  . TOTAL KNEE ARTHROPLASTY Left 12/23/2015   Procedure:  TOTAL KNEE ARTHROPLASTY;  Surgeon: Melrose Nakayama, MD;  Location: [facility];  Service: Orthopedics;  Laterality: Left;     Current Outpatient Medications:  .  albuterol (PROAIR HFA) 108 (90 Base) MCG/ACT inhaler, Inhale 2 puffs into the lungs every 6 (six) hours as needed for wheezing or shortness of breath., Disp: 1 each, Rfl: 0 .  benzonatate (TESSALON PERLES) 100 MG capsule, Take 1 capsule (100 mg total) by mouth 3 (three) times daily as needed., Disp: 20 capsule, Rfl: 0 .  doxycycline (VIBRA-TABS) 100 MG tablet, Take 1 tablet (100 mg total) by mouth 2 (two) times daily., Disp: 20 tablet, Rfl: 0 .  AIMOVIG 70 MG/ML SOAJ, INJECT 70 MG INTO THE SKIN EVERY 30 (THIRTY) DAYS. (Patient taking differently: Inject 70 mg as directed every 30 (thirty) days.), Disp: 6 pen, Rfl: 11 .  busPIRone (BUSPAR) 7.5 MG tablet, TAKE 1 TABLET BY MOUTH 2 TIMES DAILY., Disp: 180 tablet, Rfl: 0 .  Cholecalciferol (VITAMIN D3) 1.25 MG (50000 UT) CAPS, Take 1 capsule by mouth once a week. (Patient taking differently: Take 50,000 Units by mouth once a week.), Disp: 12 capsule, Rfl: 3 .  clonazePAM (KLONOPIN) 1 MG tablet, TAKE ONE TABLET BY MOUTH TWICE DAILY AS NEEDED FOR SEVERE ANXIETY SYMPTOMS, Disp: 90 tablet, Rfl: 0 .  clotrimazole-betamethasone (LOTRISONE) cream, APPLY TO AFFECTED AREA TWICE A DAY, Disp: 30 g, Rfl: 2 .  esomeprazole (NEXIUM) 40 MG capsule, TAKE 1 CAPSULE BY MOUTH EVERY DAY, Disp: 90 capsule,  Rfl: 3 .  hydrOXYzine (ATARAX/VISTARIL) 10 MG tablet, TAKE 1 TABLET (10 MG TOTAL) BY MOUTH 3 (THREE) TIMES DAILY AS NEEDED FOR ANXIETY., Disp: 270 tablet, Rfl: 1 .  Multiple Vitamin (MULTIVITAMIN WITH MINERALS) TABS tablet, Take 1 tablet by mouth daily., Disp: , Rfl:  .  ondansetron (ZOFRAN ODT) 4 MG disintegrating tablet, Take 1 tablet (4 mg total) by mouth every 8 (eight) hours as needed for nausea or vomiting., Disp: 20 tablet, Rfl: 0 .  oxybutynin (DITROPAN) 5 MG tablet, Take 1 tablet (5 mg total) by mouth 2 (two)  times daily., Disp: 60 tablet, Rfl: 3 .  phenazopyridine (PYRIDIUM) 200 MG tablet, Take 1 tablet (200 mg total) by mouth 3 (three) times daily as needed., Disp: 6 tablet, Rfl: 0 .  tiZANidine (ZANAFLEX) 4 MG capsule, TAKE 1 CAPSULE BY MOUTH EVERY DAY IN THE EVENING, Disp: 90 capsule, Rfl: 1  EXAM:  VITALS per patient if applicable:  GENERAL: alert, oriented, appears well and in no acute distress  HEENT: atraumatic, conjunttiva clear, no obvious abnormalities on inspection of external nose and ears  NECK: normal movements of the head and neck  LUNGS: on inspection no signs of respiratory distress, breathing rate appears normal, no obvious gross SOB, gasping or wheezing  CV: no obvious cyanosis  MS: moves all visible extremities without noticeable abnormality  PSYCH/NEURO: pleasant and cooperative, no obvious depression or anxiety, speech and thought processing grossly intact  ASSESSMENT AND PLAN:  Discussed the following assessment and plan:  COVID-19  -we discussed possible serious and likely etiologies, options for evaluation and workup, limitations of telemedicine visit vs in person visit, treatment, treatment risks and precautions. Pt prefers to treat via telemedicine empirically rather than in person at this moment. Discussed treatment options, potential complications, isolation and precautions. She feels strongly that she has developed a sinus infection. Opted for Tessalon for cough, alb if needed, nasal saline and doxy 100mg  bid x 10 days if worsening or not improving in case of 2ndary sinusitis.  Work/School slipped offered: provided in patient instructions  Scheduled follow up with PCP offered: agrees to schedule follow up through PCP office if needed. Advised to seek prompt in person care if worsening, new symptoms arise, or if is not improving with treatment over the next 24 hours. Discussed options for inperson care if PCP office not available. Did let this patient know that  I only do telemedicine on Tuesdays and Thursdays for [place]. Advised to schedule follow up visit with PCP or [facility] if any further questions or concerns to avoid delays in care.   I discussed the assessment and treatment plan with the patient. The patient was provided an opportunity to ask questions and all were answered. The patient agreed with the plan and demonstrated an understanding of the instructions.     Lucretia Kern, DO
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
## 2020-06-30 ENCOUNTER — Other Ambulatory Visit: Payer: Self-pay | Admitting: Family Medicine

## 2020-06-30 NOTE — Telephone Encounter (Signed)
Last office visit- 06/04/2020

## 2020-07-01 ENCOUNTER — Ambulatory Visit (INDEPENDENT_AMBULATORY_CARE_PROVIDER_SITE_OTHER): Payer: BC Managed Care – PPO | Admitting: Obstetrics and Gynecology

## 2020-07-01 ENCOUNTER — Other Ambulatory Visit: Payer: Self-pay

## 2020-07-01 ENCOUNTER — Other Ambulatory Visit: Payer: Self-pay | Admitting: Obstetrics and Gynecology

## 2020-07-01 ENCOUNTER — Encounter: Payer: Self-pay | Admitting: Obstetrics and Gynecology

## 2020-07-01 VITALS — BP 137/88 | HR 75 | Ht 64.5 in | Wt 140.0 lb

## 2020-07-01 DIAGNOSIS — N3010 Interstitial cystitis (chronic) without hematuria: Secondary | ICD-10-CM

## 2020-07-01 DIAGNOSIS — R350 Frequency of micturition: Secondary | ICD-10-CM | POA: Diagnosis not present

## 2020-07-01 DIAGNOSIS — N94810 Vulvar vestibulitis: Secondary | ICD-10-CM

## 2020-07-01 DIAGNOSIS — M62838 Other muscle spasm: Secondary | ICD-10-CM

## 2020-07-01 LAB — POCT URINALYSIS DIPSTICK
Appearance: NORMAL
Bilirubin, UA: NEGATIVE
Blood, UA: NEGATIVE
Glucose, UA: NEGATIVE
Ketones, UA: NEGATIVE
Leukocytes, UA: NEGATIVE
Nitrite, UA: NEGATIVE
Protein, UA: NEGATIVE
Spec Grav, UA: 1.01 (ref 1.010–1.025)
Urobilinogen, UA: 0.2 E.U./dL
pH, UA: 5 (ref 5.0–8.0)

## 2020-07-01 MED ORDER — AMITRIPTYLINE HCL 25 MG PO TABS: 25.0000 mg | ORAL_TABLET | Freq: Every day | ORAL | 0 refills | Status: DC

## 2020-07-01 MED ORDER — SODIUM BICARBONATE 8.4 % IJ SOLN: 5.0000 mL | INTRAMUSCULAR | 0 refills | Status: DC | PRN

## 2020-07-01 MED ORDER — LIDOCAINE HCL URETHRAL/MUCOSAL 2 % EX GEL: 1.0000 "application " | CUTANEOUS | 1 refills | Status: DC | PRN

## 2020-07-01 MED ORDER — DIAZEPAM 5 MG PO TABS: ORAL_TABLET | ORAL | 0 refills | Status: DC

## 2020-07-01 MED ORDER — LIDOCAINE HCL 2 % IJ SOLN: 20.0000 mL | INTRAMUSCULAR | 2 refills | Status: DC | PRN

## 2020-07-01 MED ORDER — BUPIVACAINE HCL 0.5 % IJ SOLN: 20.0000 mL | INTRAMUSCULAR | 2 refills | Status: DC | PRN

## 2020-07-01 MED ORDER — HYDROXYZINE HCL 25 MG PO TABS: 25.0000 mg | ORAL_TABLET | Freq: Every evening | ORAL | 5 refills | Status: DC

## 2020-07-01 MED ORDER — AMITRIPTYLINE HCL 50 MG PO TABS: 50.0000 mg | ORAL_TABLET | Freq: Every day | ORAL | 5 refills | Status: DC

## 2020-07-01 MED ORDER — HEPARIN SODIUM (PORCINE) 10000 UNIT/ML IJ SOLN: 10000.0000 [IU] | INTRAMUSCULAR | 0 refills | Status: DC | PRN

## 2020-07-01 MED ORDER — AMITRIPTYLINE HCL 10 MG PO TABS: 10.0000 mg | ORAL_TABLET | Freq: Every day | ORAL | 0 refills | Status: DC

## 2020-07-01 MED ORDER — BUPIVACAINE HCL 0.25 % IJ SOLN: 50.0000 mL | INTRAMUSCULAR | 2 refills | Status: DC | PRN

## 2020-07-01 NOTE — Telephone Encounter (Signed)
Attempt made to contact the patient re:  Is there a [facility] near her so she can pick up her medication?  [facility] [address] does not [facility] the medication for pick up Lidocaine 2% inj Bupivacaine .25 inj Heparin 5ML bottle [facility]. Rx sent to [facility] outpatient pharmacy

## 2020-07-01 NOTE — Patient Instructions (Addendum)
Interstitial Cystitis/ Bladder Pain treatment:  . Avoid irriative foods and beverages and identify other triggers . Work to decrease stress- meditation, yoga . Medications for bladder pain: pyridium (Azo), methenamine, Uribel . Medications: amitiptyline, hydroxyzine (anti-histamine), tums, L-arginine, Elmiron  . Bladder instillations for pain flares- we can start weekly for 4- 6 weeks . Cystoscopy to look in the bladder  The IC Network at https://www.ic-network.com is helpful for bladder diet suggestions and forums for support.   I will prescribe valium 5 mg pills to place vaginally up to 2 times a day for vaginal muscle spasms. Start at night and take one every night for the next several weeks to see if it improves your symptoms. Once you are improving you can taper off the medication and just use as needed. If the medication makes you drowsy then only use at bedtime and/or we can reduce the dose. Do not use gel to place it, as that will prevent the tablet from dissolving.  Instead place 1-2 drops of water on the table before inserting it in the vagina. If the pills do not dissolve well we can switch to a special compounded suppository. Let me know how you are doing on the medication and if you have any questions.     Just take valium and atarax at night because it can make you drowsy. Do not take with tizanidine.   Also have prescribed lidocaine jelly for the vaginal opening.   Bring to the office for bladder instillation: lidocaine, marcaine, heparin and sodium bicarbonate.

## 2020-07-01 NOTE — Addendum Note (Signed)
Addended by: Jaquita Folds on: 07/01/2020 02:47 PM   Modules accepted: Orders

## 2020-07-04 NOTE — Addendum Note (Signed)
Addended by: Jaquita Folds on: 07/04/2020 11:44 AM   Modules accepted: Orders

## 2020-07-04 NOTE — Progress Notes (Signed)
[facility] Return Visit  SUBJECTIVE  History of Present Illness: Gabriela Shepard is a 60 y.o. female seen in follow-up for interstitial cystitis, pelvic pain.   She started the Amitriptyline 25mg  and was finally able to sleep at night. Has tried the vaginal valium and it has worked temporarily. Has not been able to get the lidocaine jelly since it was not covered by insurance. Plan to start bladder instillations today. Requesting narcotic pain medication for pain management.    Past Medical History: Patient  has a past medical history of Arthritis, Bowel habit changes, Depression, Flatulence, Headache, History of alcohol abuse, History of UTI, Migraines, PONV (postoperative nausea and vomiting), PTSD (post-traumatic stress disorder), STD (sexually transmitted disease), Substance abuse ([place]), and Urine incontinence.   Past Surgical History: She  has a past surgical history that includes Cesarean section (1992); laparoscopy; Total knee arthroplasty (Left, 12/23/2015); Colonoscopy; Breast biopsy (Left); Neck surgery (02/24/2018); Total hip arthroplasty; and Incontinence surgery.   Medications: She has a current medication list which includes the following prescription(s): aimovig, albuterol, amitriptyline, amitriptyline, amitriptyline, benzonatate, bupivacaine, buspirone, vitamin d3, clotrimazole-betamethasone, diazepam, doxycycline, esomeprazole, heparin, hydroxyzine, hydroxyzine, lidocaine, uro-mp, methenamine-sodium salicylate, mirabegron er, multivitamin with minerals, ondansetron, oxybutynin, phenazopyridine, sodium bicarbonate, tizanidine, and [DISCONTINUED] fluticasone.   Allergies: Patient is allergic to sulfa antibiotics.   Social History: Patient  reports that she quit smoking about 9 years ago. Her smoking use included cigarettes. She has a 5.00 pack-year smoking history. She has never used smokeless tobacco. She reports that she does not drink alcohol and does not  use drugs.      OBJECTIVE     Physical Exam: Vitals:   07/09/20 1356  BP: (!) 160/127  Pulse: (!) 114  Weight: 140 lb (63.5 kg)  Height: 5\' 4"  (1.626 m)   Gen: No apparent distress, A&O x 3. Anxious  Detailed Urogynecologic Evaluation:  Deferred.   Bladder Instillation:  The patient was identified and verbally consented for the procedure. The urethra was prepped with Betadine x 3. A 16 Fr foley catheter was inserted and attached to a 60 mL syringe with the plunger removed. The medication was slowly poured into the bladder via the syringe and foley. The medication consisted of 22ml of Lidocaine 2%, 62mL of Bupivicaine 0.25%, 10,000 units/mL Heparin, 12mL Sodium Bicarbonate 8.4%. The foley was removed and the patient was asked to hold the liquids in her bladder for 30-60 minutes if possible.   Precautions were given and patient was instructed to call the office with any concerns.     ASSESSMENT AND PLAN    [name] is a 60 y.o. with:  1. Interstitial cystitis   2. Vestibulitis, vulvar    1. IC - Bladder instillations started today, will plan for a series of 6 weekly instillations - She would like to try to get into pelvic floor physical therapy sooner, so will fax referral to [facility] - Discussed that I do not provide long term narcotic medications for pain management but can refer to [facility], referral faxed to their office.  - Continue Amitriptyline, increase dose weekly to 50mg . Also will continue atarax nightly and vaginal valium as needed.   2. Vulvar vestibulitis - lidocaine jelly 2% sent to [facility] and she will use [facility] to help with cost.   Return 1 week for repeat bladder instillation.   Jaquita Folds, MD   Time spent: I spent 20 minutes dedicated to the care of this patient  on the date of this encounter to include pre-visit review of records, face-to-face time with the patient and post visit  documentation and ordering medication/ testing. Additional time was spent for the bladder instillation.

## 2020-07-09 ENCOUNTER — Ambulatory Visit (INDEPENDENT_AMBULATORY_CARE_PROVIDER_SITE_OTHER): Payer: BC Managed Care – PPO | Admitting: Obstetrics and Gynecology

## 2020-07-09 ENCOUNTER — Encounter: Payer: Self-pay | Admitting: Obstetrics and Gynecology

## 2020-07-09 ENCOUNTER — Other Ambulatory Visit: Payer: Self-pay

## 2020-07-09 VITALS — BP 160/127 | HR 114 | Ht 64.0 in | Wt 140.0 lb

## 2020-07-09 DIAGNOSIS — N3010 Interstitial cystitis (chronic) without hematuria: Secondary | ICD-10-CM

## 2020-07-09 DIAGNOSIS — N94810 Vulvar vestibulitis: Secondary | ICD-10-CM | POA: Diagnosis not present

## 2020-07-09 MED ORDER — SODIUM BICARBONATE 8.4 % IV SOLN
50.0000 meq | Freq: Once | INTRAVENOUS | Status: AC
  Administered 2020-07-09: 5 meq

## 2020-07-09 MED ORDER — HEPARIN SODIUM (PORCINE) 10000 UNIT/ML IJ SOLN
10000.0000 [IU] | Freq: Once | INTRAMUSCULAR | Status: AC
  Administered 2020-07-09: 10000 [IU] via SUBCUTANEOUS

## 2020-07-09 MED ORDER — LIDOCAINE VISCOUS HCL 2 % MT SOLN: 20.0000 mL | Freq: Once | OROMUCOSAL | Status: DC

## 2020-07-09 MED ORDER — HEPARIN SODIUM (PORCINE) 1000 UNIT/ML IJ SOLN: 10000.0000 [IU] | Freq: Once | INTRAMUSCULAR | Status: DC

## 2020-07-09 MED ORDER — LIDOCAINE HCL 2 % EX GEL: 1.0000 "application " | CUTANEOUS | 0 refills | Status: DC | PRN

## 2020-07-09 MED ORDER — LIDOCAINE HCL 2 % IJ SOLN
20.0000 mL | Freq: Once | INTRAMUSCULAR | Status: AC
Start: 2020-07-09 — End: 2020-07-09
  Administered 2020-07-09: 400 mg

## 2020-07-09 MED ORDER — SODIUM BICARBONATE 4 % IV SOLN: 5.0000 mL | Freq: Once | INTRAVENOUS | Status: DC

## 2020-07-09 MED ORDER — BUPIVACAINE HCL 0.25 % IJ SOLN
20.0000 mL | Freq: Once | INTRAMUSCULAR | Status: AC
  Administered 2020-07-09: 20 mL

## 2020-07-09 NOTE — Patient Instructions (Signed)
Today, we did a bladder instillation for bladder pain. Keep the liquids in the bladder for 30-60 min if possible then urinate normally. You may experience temporary burning with urination. Please call office with any questions or concerns.

## 2020-07-11 ENCOUNTER — Telehealth: Payer: Self-pay | Admitting: Obstetrics and Gynecology

## 2020-07-11 DIAGNOSIS — N3010 Interstitial cystitis (chronic) without hematuria: Secondary | ICD-10-CM

## 2020-07-11 MED ORDER — HYDROXYZINE HCL 25 MG PO TABS: 25.0000 mg | ORAL_TABLET | Freq: Every evening | ORAL | 5 refills | Status: DC

## 2020-07-11 NOTE — Telephone Encounter (Signed)
Patient called office stating that she had a few days relief from the bladder pain after instillation on Wednesday but now symptoms are flaring back up. She has bladder pain/ burning with urination and it is causing muscle spasms. She took some ibuprofen but it did not help that much.   She has not filled her prescription for Atarax. Advised her it was sent to [facility] and I will resend the prescription. She should take atarax and place valium tablet in vagina to help with the spasm. She can take the valium twice a day as needed (but should not drive if taking). She should also take the uribel to help with the burning. Since she also has the lidocaine jelly, she can place that around the urethra/ vagina. Also can continue to take the ibuprofen every 6 hours. Patient expressed understanding on instructions. She has an appointment to return for an instillation next week.

## 2020-07-13 ENCOUNTER — Other Ambulatory Visit: Payer: Self-pay | Admitting: Family Medicine

## 2020-07-14 DIAGNOSIS — M25551 Pain in right hip: Secondary | ICD-10-CM | POA: Diagnosis not present

## 2020-07-14 DIAGNOSIS — M1611 Unilateral primary osteoarthritis, right hip: Secondary | ICD-10-CM | POA: Diagnosis not present

## 2020-07-14 NOTE — Telephone Encounter (Signed)
Refill OK

## 2020-07-16 ENCOUNTER — Other Ambulatory Visit: Payer: Self-pay

## 2020-07-16 ENCOUNTER — Ambulatory Visit (INDEPENDENT_AMBULATORY_CARE_PROVIDER_SITE_OTHER): Payer: BC Managed Care – PPO | Admitting: Obstetrics and Gynecology

## 2020-07-16 ENCOUNTER — Ambulatory Visit: Payer: BC Managed Care – PPO | Admitting: Obstetrics and Gynecology

## 2020-07-16 VITALS — BP 137/87 | HR 85 | Wt 140.0 lb

## 2020-07-16 DIAGNOSIS — M62838 Other muscle spasm: Secondary | ICD-10-CM | POA: Diagnosis not present

## 2020-07-16 DIAGNOSIS — N3010 Interstitial cystitis (chronic) without hematuria: Secondary | ICD-10-CM

## 2020-07-16 MED ORDER — LIDOCAINE HCL 2 % IJ SOLN
20.0000 mL | Freq: Once | INTRAMUSCULAR | Status: AC
  Administered 2020-07-16: 400 mg

## 2020-07-16 MED ORDER — BUPIVACAINE HCL 0.25 % IJ SOLN
20.0000 mL | Freq: Once | INTRAMUSCULAR | Status: AC
  Administered 2020-07-16: 20 mL

## 2020-07-16 MED ORDER — SODIUM BICARBONATE 8.4 % IV SOLN
50.0000 meq | Freq: Once | INTRAVENOUS | Status: AC
  Administered 2020-07-16: 50 meq

## 2020-07-16 MED ORDER — SODIUM CHLORIDE 0.9 % IV SOLN: Freq: Once | INTRAVENOUS | Status: DC

## 2020-07-16 MED ORDER — DIAZEPAM 5 MG PO TABS: ORAL_TABLET | ORAL | 0 refills | Status: DC

## 2020-07-16 NOTE — Progress Notes (Signed)
[facility] Return Visit  SUBJECTIVE  History of Present Illness: Gabriela Shepard is a 60 y.o. female seen in follow-up for bladder pain and levator spasm.   She ran out of vaginal valium pills. She has been using them up to 2 times per day depending on her pain. She has an appt with physical therapy at [facility] next week. She has not heard back about the pain management. Has been taking atarax nightly, in addition to 50mg  amitriptyline daily. She has noticed that when she has work stress, her pain starts to flare. She felt that she did have some relief with the bladder instillation last week.   She is also here for bladder instillation #2 today.    Past Medical History: Patient  has a past medical history of Arthritis, Bowel habit changes, Depression, Flatulence, Headache, History of alcohol abuse, History of UTI, Migraines, PONV (postoperative nausea and vomiting), PTSD (post-traumatic stress disorder), STD (sexually transmitted disease), Substance abuse ([place]), and Urine incontinence.   Past Surgical History: She  has a past surgical history that includes Cesarean section (1992); laparoscopy; Total knee arthroplasty (Left, 12/23/2015); Colonoscopy; Breast biopsy (Left); Neck surgery (02/24/2018); Total hip arthroplasty; and Incontinence surgery.   Medications: She has a current medication list which includes the following prescription(s): aimovig, albuterol, amitriptyline, amitriptyline, amitriptyline, benzonatate, bupivacaine, buspirone, vitamin d3, clotrimazole-betamethasone, diazepam, diazepam, doxycycline, esomeprazole, heparin, hydroxyzine, lidocaine, uro-mp, methenamine-sodium salicylate, mirabegron er, multivitamin with minerals, ondansetron, oxybutynin, phenazopyridine, sodium bicarbonate, tizanidine, and [DISCONTINUED] fluticasone.   Allergies: Patient is allergic to sulfa antibiotics.   Social History: Patient  reports that she quit smoking about 9 years  ago. Her smoking use included cigarettes. She has a 5.00 pack-year smoking history. She has never used smokeless tobacco. She reports that she does not drink alcohol and does not use drugs.      OBJECTIVE     Physical Exam: Vitals:   07/16/20 1314  BP: 137/87  Pulse: 85  Weight: 140 lb (63.5 kg)   Gen: No apparent distress, A&O x 3.  Detailed Urogynecologic Evaluation:  Deferred.   Bladder Instillation #2: The patient was identified and verbally consented for the procedure. The urethra was prepped with Betadine x 3. A 16 Fr foley catheter was inserted and attached to a 60 mL syringe with the plunger removed. The medication was slowly poured into the bladder via the syringe and foley. The medication consisted of 52ml of Lidocaine 2%, 61mL of Bupivicaine 0.25%, 10,000 units/mL Heparin, 52mL Sodium Bicarbonate 8.4%. The foley was removed and the patient was asked to hold the liquids in her bladder for 30-60 minutes if possible.   Precautions were given and patient was instructed to call the office or on-call number for any concerns.  ASSESSMENT AND PLAN    Gabriela Shepard is a 60 y.o. with:  1. Levator spasm   2. Interstitial cystitis     1. Levator spasm - refilled vaginal valium 5mg  tabs #30 today. Discussed the addition of vaginal trigger point injections with bupivacaine/ kenalog to help decrease dependence on valium, as well as make physical therapy and self massage more comfortable.  - We reviewed how to massage vaginal muscles at home. She has done it occasionally with her finger but finds it hard to do sometimes. Showed her a pelvic wand that can help assist to reach the area.   2. IC - Bladder instillation #2 done today - She will continue to work on decreasing stress, as this seems to be a  trigger for her - Starting pelvic physical therapy next week at [facility].  - Continue amitriptyline 50mg , atarax nightly, bladder supplements with L-arginine  Return next week for  bladder instillation and trigger point injections   Jaquita Folds, MD  Time spent: I spent 20 minutes dedicated to the care of this patient on the date of this encounter to include pre-visit review of records, face-to-face time with the patient and post visit documentation and ordering medication/ testing.

## 2020-07-17 ENCOUNTER — Other Ambulatory Visit: Payer: Self-pay

## 2020-07-17 ENCOUNTER — Encounter: Payer: Self-pay | Admitting: Physical Therapy

## 2020-07-17 ENCOUNTER — Ambulatory Visit: Payer: BC Managed Care – PPO | Attending: Obstetrics and Gynecology | Admitting: Physical Therapy

## 2020-07-17 DIAGNOSIS — R102 Pelvic and perineal pain: Secondary | ICD-10-CM | POA: Diagnosis not present

## 2020-07-17 DIAGNOSIS — R252 Cramp and spasm: Secondary | ICD-10-CM

## 2020-07-17 DIAGNOSIS — R278 Other lack of coordination: Secondary | ICD-10-CM | POA: Diagnosis not present

## 2020-07-17 DIAGNOSIS — M6281 Muscle weakness (generalized): Secondary | ICD-10-CM | POA: Diagnosis not present

## 2020-07-17 NOTE — Patient Instructions (Addendum)
Coccyx / Pelvic Floor Pillows  Leg elevating pillow                                                                                            DMI Seat Cushion for coccyx                                                                              Wonder Gel Seat Cushion-evenly distributes weight                                                                                                   G Seat Ultra                                                                                                                      Tush Cush- good for tailbone and anal pain                                                          Theraseat- [email or web address]             Oval goos for genital or anal pain                     Round good for sit bone pain            Back cut out good for tailbone or anal pain  Pelvic Pain Solutions U Cushion Also has a folding cushion [email or web address]            [place] - perineal, genital, coccyx, and anal pain    [email or web address]-has 2 panels, 3 levels of firmness   DIY cushion- garden kneeling cushion and cut out areas to relieve pressure   Sit on a toilet seat  and add foam cushion around it  Can get all of the other coccyx pillows on [place].   Abdominal Massage for Constipation #celebratemuliebrity you tube  Moisturizers   Creams to use externally on the Vulva area  Albertson's (good for for cancer patients that had radiation to the area)- [place] or [email or web address]  V-magic cream - amazon  Julva-amazon  Vital "V [place] salve ( help moisturize and help with thinning vulvar area, does have [place] by Irwin Brakeman labial moisturizer ([place],   Coconut or olive oil  aloe   Things to avoid in the vaginal area . Do not use things to irritate the vulvar area . No lotions just specialized creams for the vulva area- Neogyn, V-magic, No soaps;  can use Aveeno or Calendula cleanser if needed. Must be gentle . No deodorants . No douches . Good to sleep without underwear to let the vaginal area to air out . No scrubbing: spread the lips to let warm water rinse over labias and pat dry    Self treatment for pelvic pain  Connect PT    10-Minute Breath Meditation for Pelvic Health and Healing    What People Don't Know About Interstitial Cystitis  Pelvic Health and [place]  A Stretch Routine For Your Pelvic Floor  Pelvic [place] fusion pelvic stretch  [facility] [address], [place], [place] [address] Phone # [phone] Fax [phone]

## 2020-07-17 NOTE — Therapy (Addendum)
[facility] [address], [place], [place], [address] Phone: [phone]   Fax:  [phone]  Physical Therapy Evaluation  Patient Details  Name: SHARIAH ASSAD MRN: [id number] Date of Birth: 1961-03-07 Referring Provider (PT): Dr. Sherlene Shams   Encounter Date: 07/17/2020   PT End of Session - 07/17/20 1024    Visit Number 1    Date for PT Re-Evaluation 11/14/20    Authorization Type BCBS    PT Start Time 0930    PT Stop Time 1022    PT Time Calculation (min) 52 min    Activity Tolerance Patient tolerated treatment well;No increased pain    Behavior During Therapy WFL for tasks assessed/performed           Past Medical History:  Diagnosis Date  . Arthritis   . Bowel habit changes    been going on couple years  . Depression   . Flatulence    excessive with strong odor/uncontrollable  . Headache   . 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 History of UTI   . Migraines   . PONV (postoperative nausea and vomiting)   . PTSD (post-traumatic stress disorder)   . STD (sexually transmitted disease)   . Substance abuse ([place])   . Urine incontinence     Past Surgical History:  Procedure Laterality Date  . BREAST BIOPSY Left    2017 benign x 2  . CESAREAN SECTION  1992  . COLONOSCOPY    . INCONTINENCE SURGERY    . LAPAROSCOPY     under bladder  . NECK SURGERY  02/24/2018   ACDF  . TOTAL HIP ARTHROPLASTY     left  . TOTAL KNEE ARTHROPLASTY Left 12/23/2015   Procedure: TOTAL KNEE ARTHROPLASTY;  Surgeon: Melrose Nakayama, MD;  Location: [facility];  Service: Orthopedics;  Laterality: Left;    There were no vitals filed for this visit.    Subjective Assessment - 07/17/20 0935    Subjective After Thanksgiving got a know in her stomach. Has alot of anxiety. Patient was not able to relax. Patient did not have an infection. I had a bladder instilllation yesterday. Patient has a right hip  labrum tear. When patient is stressed she will tightness in the pelvic floor. Patient will be looking into a right THR in the next 3 months. Burning of vaginal lips and urethra.    Patient Stated Goals learn to manage stress and anxiety; learn to relax the pelvic floor and reduce the pain    Currently in Pain? Yes    Pain Score 9     Pain Location Pelvis   buttocks   Pain Orientation Right;Left    Pain Descriptors / Indicators Burning    Pain Type Acute pain    Pain Radiating Towards left calk    Pain Frequency Constant    Aggravating Factors  sitting,stress    Pain Relieving Factors being off the pelvic floor    Multiple Pain Sites No              [facility] PT Assessment - 07/17/20 0001      Assessment   Medical Diagnosis N30.10 interstitial Cystitis    Referring Provider (PT) Dr. Sherlene Shams    Onset Date/Surgical Date 05/01/20    Prior Therapy none      Precautions   Precautions Anterior Hip      Restrictions   Weight Bearing Restrictions No  Balance Screen   Has the patient fallen in the past 6 months No    Has the patient had a decrease in activity level because of a fear of falling?  No    Is the patient reluctant to leave their home because of a fear of falling?  No      Home Ecologist residence      Prior Function   Level of Independence Independent    Vocation Full time employment    Loss adjuster, chartered; problem solver; up and down job    Leisure exercise will affect the right hip and SI joint      Cognition   Overall Cognitive Status Within Functional Limits for tasks assessed      Posture/Postural Control   Posture/Postural Control Postural limitations    Posture Comments not able to sit well      ROM / Strength   AROM / PROM / Strength AROM;PROM;Strength      AROM   Overall AROM Comments lumbar ROM is full      Strength   Right Hip Extension 4-/5    Left Hip Extension 4-/5    Left  Hip Internal Rotation 4/5      Palpation   Palpation comment tightness and tenderness located in mid upper abdominal, left side of abdomen, suprapubic area                      Objective measurements completed on examination: See above findings.     Pelvic Floor Special Questions - 07/17/20 0001    Prior Pregnancies Yes    Number of Pregnancies 2    Number of C-Sections 1    Number of Vaginal Deliveries 1    Currently Sexually Active No    Urinary Leakage No    Urinary urgency Yes    Fecal incontinence No   constipation, every 3 days, strain, goes firm to loose   Skin Integrity Intact    External Palpation q-tip test at 12:00    Pelvic Floor Internal Exam Patient confirms identification and approves PT to assess pelvic floor and treatment    Exam Type Vaginal    Palpation tenderness located on the urethra sphincter, bil. obturator internist, puborectalis, puborvaginalis, levator ani, iliococcygeus    Strength weak squeeze, no lift   more posterior than anterior   Tone increased            [facility] Adult PT Treatment/Exercise - 07/17/20 0001      Self-Care   Self-Care Other Self-Care Comments    Other Self-Care Comments  educated patient on the pelvic floor and vaginal analtomy with a mirror; education on vaginal moisturizers; education on abdominal massage for constipation; education on pelvic floor meditation; education on pelvic floor stretches and perineal massage; instructed patient to get the vibrating pelvic wand                  PT Education - 07/17/20 1032    Education Details information on you tube videos on meditation, perineal massage, hip stretches, abdominal massage for constipation    Person(s) Educated Patient    Methods Explanation;Demonstration;Handout    Comprehension Verbalized understanding            PT Short Term Goals - 07/17/20 1043      PT SHORT TERM GOAL #1   Title be independent in initial HEP    Time 4    Period Weeks  Status New    Target Date 08/14/20      PT SHORT TERM GOAL #2   Title verbalize and demonstrate neutral seated posture and report postural corrections with work at computer with using a cushion to reduce the pelvic pain    Time 4    Period Weeks    Status New    Target Date 08/14/20      PT SHORT TERM GOAL #3   Title pelvic pain decreased >/= 6/10 due to reduction of the trigger points in the pelvic floor    Time 4    Period Weeks    Status New    Target Date 08/14/20      PT SHORT TERM GOAL #4   Title able to bulge the pelvic floor to reduce straining with a bowel movement    Time 4    Period Weeks    Status New    Target Date 08/14/20             PT Long Term Goals - 07/17/20 1045      PT LONG TERM GOAL #1   Title be independent in advanced HEP    Time 4    Period Months    Status New    Target Date 11/14/20      PT LONG TERM GOAL #2   Title able to sit with pelvic pain decreased >/= 2/10 due to reduction of muscle spasms in the pelvic floor    Time 4    Period Months    Status New    Target Date 11/14/20      PT LONG TERM GOAL #3   Title understand ways to manage her muscle spasms and pelvic pain for when she may have a flare-up    Time 4    Period Months    Target Date 11/14/20      PT LONG TERM GOAL #4   Title able to demonstrate correct toileting to reduce the straining of the pelvic floor and improve bowel movement to more solid form    Time 4    Period Months    Status New    Target Date 11/14/20      PT LONG TERM GOAL #5   Title ----                  Plan - 07/17/20 1033    Clinical Impression Statement Patient is a 60 year old female with interstitial cystitis since 05/01/2020 when she had anxiety and feeling of a pit in her stomach. Patient reports her pelvic pain is 9/10 and will intensify with sitting and stress. Patient has weakness in hip extensors. Patient pelvis in correct alignment. Patient has tenderness located in the  lower abdominal, hip adductors, mid superior abdominals, perineal body, bilateral levator ani, obturator internist, and urethra sphincter. Pelvic floor strength is 2/5 with more contraction posterior than anterior. Patient has difficulty with breathing and opening the rib cage. Patient has difficulty with constipation going every 3 days and switches from firm stool to diarreaha. Patient has had a bladder instilation yesterday. She is using vaginal valium daily. Patient will benefit from skilled therapy to reduce pelvic pain and improve overall function and quality of life.    Personal Factors and Comorbidities Age;Fitness;Comorbidity 3+    Comorbidities Left THR; right labrial tear that will be repaired in the next 3 months; Incontinence surgery; C-section 1992    Examination-Activity Limitations Sit;Toileting;Locomotion Level    Examination-Participation Restrictions Community  Activity;Occupation;Driving    Stability/Clinical Decision Making Evolving/Moderate complexity    Clinical Decision Making Moderate    Rehab Potential Excellent    PT Frequency 1x / week    PT Duration Other (comment)   4 months   PT Treatment/Interventions ADLs/Self Care Home Management;Biofeedback;Cryotherapy;Electrical Stimulation;Moist Heat;Ultrasound;Neuromuscular re-education;Therapeutic exercise;Therapeutic activities;Patient/family education;Manual techniques;Dry needling;Taping;Spinal Manipulations    PT Next Visit Plan work on diaphragmatic breathing; internal muslce work; see how her HEP is doing; ask about the vaginal wand    Consulted and Agree with Plan of Care Patient           Patient will benefit from skilled therapeutic intervention in order to improve the following deficits and impairments:  Decreased coordination,Increased fascial restricitons,Increased muscle spasms,Decreased endurance,Decreased activity tolerance,Pain,Decreased strength  Visit Diagnosis: Muscle weakness (generalized) - Plan: PT plan  of care cert/re-cert  Cramp and spasm - Plan: PT plan of care cert/re-cert  Other lack of coordination - Plan: PT plan of care cert/re-cert  Pelvic pain - Plan: PT plan of care cert/re-cert     Problem List Patient Active Problem List   Diagnosis Date Noted  . Age-related osteoporosis without current pathological fracture 08/06/2019  . Hyponatremia 08/06/2019  . Multiple thyroid nodules 08/06/2019  . 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 Piriformis syndrome, right 08/09/2018  . Weight gain 02/08/2018  . Trigger point of right shoulder region 12/22/2017  . Cervical radiculopathy at C6 11/29/2017  . Osteopenia 03/09/2016  . Primary osteoarthritis of left knee 12/23/2015  . IBS (irritable bowel syndrome) 02/19/2015  . Postmenopausal 12/19/2013  . Migraine headache 12/05/2012  . Pain, upper back 09/24/2010  . Depression 09/04/2010    Earlie Counts, PT 07/17/20 10:50 AM   [facility] [address] [place], [place], [address] Phone: [phone]   Fax:  [phone]  Name: DASHAYLA THEISSEN MRN: [id number] Date of Birth: 15-May-1961 PHYSICAL THERAPY DISCHARGE SUMMARY  Visits from Start of Care: 1  Current functional level related to goals / functional outcomes: See above. Patient called today to cancel all of her appointments and found another facility she was able to get in sooner for physical therapy.    Remaining deficits: See above.    Education / Equipment: HEP Plan: Patient agrees to discharge.  Patient goals were not met. Patient is being discharged due to the patient's request.  Thank you for the referral. Earlie Counts, PT 07/28/20 10:05 AM  ?????

## 2020-07-22 ENCOUNTER — Other Ambulatory Visit: Payer: Self-pay

## 2020-07-22 ENCOUNTER — Other Ambulatory Visit: Payer: Self-pay | Admitting: Obstetrics and Gynecology

## 2020-07-22 ENCOUNTER — Encounter: Payer: Self-pay | Admitting: Family Medicine

## 2020-07-22 ENCOUNTER — Encounter: Payer: Self-pay | Admitting: Internal Medicine

## 2020-07-22 ENCOUNTER — Ambulatory Visit: Payer: BC Managed Care – PPO | Admitting: Family Medicine

## 2020-07-22 VITALS — BP 100/78 | HR 84 | Temp 97.6°F | Ht 64.0 in | Wt 138.4 lb

## 2020-07-22 DIAGNOSIS — M62838 Other muscle spasm: Secondary | ICD-10-CM

## 2020-07-22 DIAGNOSIS — M6289 Other specified disorders of muscle: Secondary | ICD-10-CM | POA: Diagnosis not present

## 2020-07-22 DIAGNOSIS — F339 Major depressive disorder, recurrent, unspecified: Secondary | ICD-10-CM

## 2020-07-22 MED ORDER — BACLOFEN 10 MG PO TABS: 10.0000 mg | ORAL_TABLET | Freq: Three times a day (TID) | ORAL | 0 refills | Status: DC

## 2020-07-22 MED ORDER — DIAZEPAM 5 MG PO TABS: ORAL_TABLET | ORAL | 0 refills | Status: DC

## 2020-07-22 MED ORDER — ESCITALOPRAM OXALATE 10 MG PO TABS: 10.0000 mg | ORAL_TABLET | Freq: Every day | ORAL | 5 refills | Status: DC

## 2020-07-22 NOTE — Progress Notes (Signed)
Established Patient Office Visit  Subjective:  Patient ID: Gabriela Shepard, female    DOB: [birth year]  Age: 60 y.o. MRN: [id number]  CC:  Chief Complaint  Patient presents with  . Dysuria    Patient stated she has been seen by a urologist, given Amitriptyline which she feels has caused depression and requests a muscle relaxer for the pelvic floor    HPI Gabriela Shepard presents for medical follow-up.  She has chronic problems including history of migraine headaches, IBS, osteoarthritis involving multiple joints, recurrent depression, and remote history of alcohol abuse.  She has been abstinent now for several years.  She has had very stressful last few months.  She feels that her depression has worsened.  She attributes some of this to feeling poor physically.  She states she has tear of her right hip labrum.  She is trying to get that repaired at some point.  She has had tremendous amount of pelvic pain and is recently seen urogynecologist.  She has been tried on multiple medications including hydroxyzine, intravaginal diazepam, amitriptyline.  She remains on Aimovig for migraine prevention.  In spite of the above she still had severe pelvic pain at times.  She feels she has intense spasm.  She specifically would like to consider trial of baclofen.  There is been some question raised of interstitial cystitis.  She states she has not had previous cystoscopy.  She relates frequent crying spells and decreased motivation.  No active suicidal ideation.  Took Effexor for quite some time.  She had difficulty discontinuing because of withdrawal side effects.  She also taken Prozac for several years.  Feels very anxious in addition to depression symptoms.  Past Medical History:  Diagnosis Date  . Arthritis   . Bowel habit changes    been going on couple years  . Depression   . Flatulence    excessive with strong odor/uncontrollable  . Headache   . History of alcohol abuse    five years sober  as of 2017  . History of UTI   . Migraines   . PONV (postoperative nausea and vomiting)   . PTSD (post-traumatic stress disorder)   . STD (sexually transmitted disease)   . Substance abuse ([place])   . Urine incontinence     Past Surgical History:  Procedure Laterality Date  . BREAST BIOPSY Left    2017 benign x 2  . CESAREAN SECTION  1992  . COLONOSCOPY    . INCONTINENCE SURGERY    . LAPAROSCOPY     under bladder  . NECK SURGERY  02/24/2018   ACDF  . TOTAL HIP ARTHROPLASTY     left  . TOTAL KNEE ARTHROPLASTY Left 12/23/2015   Procedure: TOTAL KNEE ARTHROPLASTY;  Surgeon: Melrose Nakayama, MD;  Location: [place];  Service: Orthopedics;  Laterality: Left;    Family History  Problem Relation Age of Onset  . Hypertension Mother   . Lung cancer Mother   . Alcohol abuse Other   . Arthritis Other   . Hypertension Other   . Mental illness Other   . Kidney cancer Father   . Ulcers Father   . Drug abuse Sister   . Hypertension Maternal Grandmother   . Stomach cancer Maternal Uncle   . Colon cancer Neg Hx   . Esophageal cancer Neg Hx     Social History   Socioeconomic History  . Marital status: Divorced    Spouse name: Not on file  . Number  of children: Not on file  . Years of education: Not on file  . Highest education level: Not on file  Occupational History  . Not on file  Tobacco Use  . Smoking status: Former Smoker    Packs/day: 0.50    Years: 10.00    Pack years: 5.00    Types: Cigarettes    Quit date: 11/13/2010    Years since quitting: 9.6  . Smokeless tobacco: Never Used  Vaping Use  . Vaping Use: Never used  Substance and Sexual Activity  . Alcohol use: No  . Drug use: No  . Sexual activity: Not Currently    Birth control/protection: Post-menopausal  Other Topics Concern  . Not on file  Social History Narrative  . Not on file   Social Determinants of Health   Financial Resource Strain: Not on file  Food Insecurity: Not on file  Transportation  Needs: Not on file  Physical Activity: Not on file  Stress: Not on file  Social Connections: Not on file  Intimate Partner Violence: Not on file    Outpatient Medications Prior to Visit  Medication Sig Dispense Refill  . AIMOVIG 70 MG/ML SOAJ INJECT 70 MG INTO THE SKIN EVERY 30 (THIRTY) DAYS. (Patient taking differently: Inject 70 mg as directed every 30 (thirty) days.) 6 pen 11  . amitriptyline (ELAVIL) 50 MG tablet Take 1 tablet (50 mg total) by mouth at bedtime. Start week 3 of treatment 30 tablet 5  . bupivacaine (MARCAINE) 0.5 % injection 20 mLs by Infiltration route as needed (for bladder instillation). Bring to office for bladder instillation 50 mL 2  . Cholecalciferol (VITAMIN D3) 1.25 MG (50000 UT) CAPS TAKE 1 CAPSULE BY MOUTH ONE TIME PER WEEK 12 capsule 3  . clotrimazole-betamethasone (LOTRISONE) cream APPLY TO AFFECTED AREA TWICE A DAY 30 g 2  . diazepam (VALIUM) 5 MG tablet Place 1 tablet vaginally nightly as needed for muscle spasm/ pelvic pain. 30 tablet 0  . diazepam (VALIUM) 5 MG tablet Place up to 2 tablets daily as needed for muscle spasm/ pelvic pain. 30 tablet 0  . esomeprazole (NEXIUM) 40 MG capsule TAKE 1 CAPSULE BY MOUTH EVERY DAY 90 capsule 3  . heparin 10000 UNIT/ML injection Inject 1 mL (10,000 Units total) into the skin as needed (bladder instillations). Bring to office for bladder instillation 5 mL 0  . hydrOXYzine (ATARAX/VISTARIL) 25 MG tablet Take 1 tablet (25 mg total) by mouth at bedtime. 30 tablet 5  . lidocaine (XYLOCAINE) 2 % jelly Place 1 application into the urethra as needed. 30 mL 0  . Meth-Hyo-M Bl-Na Phos-Ph Sal (URO-MP) 118 MG CAPS Take by mouth.    . Methenamine-Sodium Salicylate (CYSTEX PO) Take by mouth.    . Multiple Vitamin (MULTIVITAMIN WITH MINERALS) TABS tablet Take 1 tablet by mouth daily.    . ondansetron (ZOFRAN ODT) 4 MG disintegrating tablet Take 1 tablet (4 mg total) by mouth every 8 (eight) hours as needed for nausea or vomiting. 20  tablet 0  . phenazopyridine (PYRIDIUM) 200 MG tablet Take 1 tablet (200 mg total) by mouth 3 (three) times daily as needed. 6 tablet 0  . sodium bicarbonate 8.4 % SOLN Irrigate with 5 mLs as directed as needed (for bladder instillation). Bring to office for bladder instillation 50 mL 0  . tiZANidine (ZANAFLEX) 4 MG capsule TAKE 1 CAPSULE BY MOUTH EVERY DAY IN THE EVENING 90 capsule 1  . albuterol (VENTOLIN HFA) 108 (90 Base) MCG/ACT inhaler TAKE 2  PUFFS BY MOUTH EVERY 6 HOURS AS NEEDED FOR WHEEZE OR SHORTNESS OF BREATH 6.7 each 0  . amitriptyline (ELAVIL) 10 MG tablet Take 1 tablet (10 mg total) by mouth at bedtime. Start week 1 of treatment 7 tablet 0  . amitriptyline (ELAVIL) 25 MG tablet Take 1 tablet (25 mg total) by mouth at bedtime. Start week 2 of treatment 7 tablet 0  . benzonatate (TESSALON PERLES) 100 MG capsule Take 1 capsule (100 mg total) by mouth 3 (three) times daily as needed. 20 capsule 0  . busPIRone (BUSPAR) 7.5 MG tablet TAKE 1 TABLET BY MOUTH 2 TIMES DAILY. 180 tablet 0  . doxycycline (VIBRA-TABS) 100 MG tablet Take 1 tablet (100 mg total) by mouth 2 (two) times daily. 20 tablet 0  . mirabegron ER (MYRBETRIQ) 50 MG TB24 tablet Take 50 mg by mouth daily.    Marland Kitchen oxybutynin (DITROPAN) 5 MG tablet Take 1 tablet (5 mg total) by mouth 2 (two) times daily. 60 tablet 3   Facility-Administered Medications Prior to Visit  Medication Dose Route Frequency Provider Last Rate Last Admin  . bupivacaine (MARCAINE) 0.25 % (with pres) injection 20 mL  20 mL Infiltration Once Jaquita Folds, MD      . heparin sodium (porcine) 1,000 Units in sodium chloride 0.9 % 500 mL irrigation   Irrigation Once Jaquita Folds, MD      . lidocaine (XYLOCAINE) 2 % (with pres) injection 400 mg  20 mL Other Once Jaquita Folds, MD      . sodium bicarbonate injection 50 mEq  50 mEq Other Once Jaquita Folds, MD        Allergies  Allergen Reactions  . Sulfa Antibiotics Hives     ROS Review of Systems  Constitutional: Positive for fatigue. Negative for chills and fever.  Genitourinary: Positive for pelvic pain. Negative for hematuria.  Psychiatric/Behavioral: Positive for dysphoric mood and sleep disturbance. The patient is nervous/anxious.       Objective:    Physical Exam Vitals reviewed.  Constitutional:      Comments: Patient tearful off and on during interview and evaluation today.  Cardiovascular:     Rate and Rhythm: Normal rate and regular rhythm.  Pulmonary:     Effort: Pulmonary effort is normal.     Breath sounds: Normal breath sounds. No wheezing or rales.  Neurological:     Mental Status: She is alert.     BP 100/78 (BP Location: Right Arm, Patient Position: Sitting, Cuff Size: Normal)   Pulse 84   Temp 97.6 F (36.4 C) (Oral)   Ht 5\' 4"  (1.626 m)   Wt 138 lb 6.4 oz (62.8 kg)   LMP 06/07/2010 (Approximate)   BMI 23.76 kg/m  Wt Readings from Last 3 Encounters:  07/22/20 138 lb 6.4 oz (62.8 kg)  07/16/20 140 lb (63.5 kg)  07/09/20 140 lb (63.5 kg)     Health Maintenance Due  Topic Date Due  . COVID-19 Vaccine (1) Never done  . HIV Screening  Never done  . COLONOSCOPY (Pts 45-18yrs Insurance coverage will need to be confirmed)  05/03/2020    There are no preventive care reminders to display for this patient.  Lab Results  Component Value Date   TSH 1.85 08/15/2019   Lab Results  Component Value Date   WBC 7.2 09/26/2019   HGB 14.8 09/26/2019   HCT 44.8 09/26/2019   MCV 89.2 09/26/2019   PLT 382 09/26/2019   Lab Results  Component Value Date   NA 133 (L) 09/26/2019   K 4.4 09/26/2019   CO2 25 09/26/2019   GLUCOSE 122 (H) 09/26/2019   BUN 8 09/26/2019   CREATININE 0.73 09/26/2019   BILITOT 0.5 09/26/2019   ALKPHOS 46 09/26/2019   AST 45 (H) 09/26/2019   ALT 41 09/26/2019   PROT 8.2 (H) 09/26/2019   ALBUMIN 4.3 09/26/2019   CALCIUM 9.2 09/26/2019   ANIONGAP 12 09/26/2019   GFR 81.70 08/06/2019   Lab  Results  Component Value Date   CHOL 239 (H) 04/30/2019   Lab Results  Component Value Date   HDL 65.20 04/30/2019   Lab Results  Component Value Date   LDLCALC 148 (H) 04/30/2019   Lab Results  Component Value Date   TRIG 128.0 04/30/2019   Lab Results  Component Value Date   CHOLHDL 4 04/30/2019   Lab Results  Component Value Date   HGBA1C 5.9 05/02/2017      Assessment & Plan:   #1 history of recurrent depression.  Patient currently in counseling and we have encouraged her to continue with that.  We discussed addition of Lexapro 10 mg once daily to hopefully help with some of her anxiety and depression symptoms concomitantly.  We will plan to see her back in 3 weeks to reassess  #2 levator spasm/pelvic floor dysfunction.  She is having ongoing difficulties in spite of medication as above.  We have encouraged her to continue with urogynecologist.  She specifically requested trial of baclofen as she feels she has significant muscle spasm especially at night.  Wrote for limited baclofen 10 mg every 8 hours as needed.  She is aware this may cause some sedation.  We will try to avoid opioid medications if possible especially with her past history of alcohol abuse.  Meds ordered this encounter  Medications  . baclofen (LIORESAL) 10 MG tablet    Sig: Take 1 tablet (10 mg total) by mouth 3 (three) times daily.    Dispense:  30 each    Refill:  0  . escitalopram (LEXAPRO) 10 MG tablet    Sig: Take 1 tablet (10 mg total) by mouth daily.    Dispense:  30 tablet    Refill:  5    Follow-up: Return in about 3 weeks (around 08/12/2020).    Carolann Littler, MD

## 2020-07-23 ENCOUNTER — Ambulatory Visit (INDEPENDENT_AMBULATORY_CARE_PROVIDER_SITE_OTHER): Payer: BC Managed Care – PPO | Admitting: Obstetrics and Gynecology

## 2020-07-23 ENCOUNTER — Ambulatory Visit: Payer: BC Managed Care – PPO | Admitting: Obstetrics and Gynecology

## 2020-07-23 ENCOUNTER — Other Ambulatory Visit: Payer: Self-pay | Admitting: Obstetrics and Gynecology

## 2020-07-23 VITALS — BP 136/87 | HR 108 | Wt 138.0 lb

## 2020-07-23 DIAGNOSIS — M62838 Other muscle spasm: Secondary | ICD-10-CM | POA: Diagnosis not present

## 2020-07-23 DIAGNOSIS — N3010 Interstitial cystitis (chronic) without hematuria: Secondary | ICD-10-CM | POA: Diagnosis not present

## 2020-07-23 MED ORDER — SODIUM BICARBONATE 8.4 % IV SOLN
5.0000 mL | Freq: Once | INTRAVENOUS | Status: AC
  Administered 2020-07-23: 5 mL

## 2020-07-23 MED ORDER — HEPARIN SODIUM (PORCINE) 1000 UNIT/ML IJ SOLN: 10000.0000 [IU] | Freq: Once | INTRAMUSCULAR | Status: DC

## 2020-07-23 MED ORDER — LIDOCAINE HCL 2 % IJ SOLN
20.0000 mL | Freq: Once | INTRAMUSCULAR | Status: AC
  Administered 2020-07-23: 400 mg

## 2020-07-23 MED ORDER — BUPIVACAINE HCL 0.25 % IJ SOLN
19.0000 mL | Freq: Once | INTRAMUSCULAR | Status: AC
  Administered 2020-07-23: 19 mL

## 2020-07-23 MED ORDER — TRIAMCINOLONE ACETONIDE 40 MG/ML IJ SUSP
40.0000 mg | Freq: Once | INTRAMUSCULAR | Status: AC
  Administered 2020-07-23: 40 mg via INTRAMUSCULAR

## 2020-07-23 NOTE — Progress Notes (Signed)
Subjective: Ms. Gabriela Shepard is a 60 y.o. female who presents for bladder instillation and levator trigger point injection.   Started physical therapy with [facility]. She bought a pelvic wand and is not sure how to use it. She was seen by her PCP and prescribed Baclofen and Lexapro.   Objective: Vitals:   07/23/20 1427  BP: 136/87  Pulse: (!) 108     Bladder Instillation: Series #3 The patient was identified and verbally consented for the procedure. The urethra was prepped with Betadine x 3. A 16 Fr foley catheter was inserted and attached to a 60 mL syringe with the plunger removed. The medication was slowly poured into the bladder via the syringe and foley. The medication consisted of 66ml of Lidocaine 2%, 47mL of Bupivicaine 0.25%, 10,000 units/mL Heparin, 75mL Sodium Bicarbonate 8.4%. The foley was removed and the patient was asked to hold the liquids in her bladder for 30-60 minutes if possible.   Precautions were given and patient was instructed to call the office or on-call number for any concerns.  Trigger point Injection: Series #1  Procedure:   The patient was positioned in dorsal lithotomy position.  The vaginal tissues were prepped with Hibiclens solution.  An injection of 10cc of a mixture of 90% anesthetic (0.25% Bupivacaine) and 10% 40mg /ml Triamcinolone acetomide (Kenalog) was performed in multiple in the bilateral levator muscles, a total of 6 injection sites.  Pressure was held over bleeding areas until good hemostasis was achieved.   The patient tolerated the procedure well with no apparent complications.  Patient was also shown how to use pelvic wand.  A/P: 60yo F with  Encounter Diagnoses  Name Primary?  . Levator spasm Yes  . Interstitial cystitis    1. Levator spasm - previously prescribed vaginal valium tablets which she will continue to use as needed.  - Trigger point injections weekly x6 weeks to help relax muscles and decrease valium use.  -  Continue with physical therapy - Continue self massage with pelvic wand, demonstrated today. Discussed placing pressure and gently massaging muscle with tip of device.   2. IC - continue weekly bladder instillations, she feels that she has improvement with this.  - Continue amitripyline 50mg  daily, vistaril as needed, and bladder supplements  Jaquita Folds, MD  Time spent: I spent 15 minutes dedicated to the care of this patient on the date of this encounter to include pre-visit review of records, face-to-face time with the patient and post visit documentation and ordering medication/ testing. Additional time was spent for procedures.

## 2020-07-23 NOTE — Progress Notes (Signed)
Gabriela Shepard is a 60 y.o. female here fir a bladder treatment. Pt said the last treatment worked well.   The patient was identified and verbally consented for the procedure.  The urethra was prepped with Betadine.  Pt cathed for 20cc A 16 Fr foley catheter was inserted and attached to a 60 mL syringe with the plunger removed.  The medication was slowly poured into the bladder via the syringe and foley.  The medication consisted of:  Lidocaine 2%, 31mL Bupivicaine 0.25%, 10,000 units/mL, 59mL  Heparin 83mL  Sodium Bicarbonate 8.4% 4mL  The foley was removed and the patient was asked to hold the liquids in her bladder for 2hrs if possible.   Precautions were given and patient was instructed to call the office or on-call number for any concerns.

## 2020-07-24 ENCOUNTER — Ambulatory Visit: Payer: BC Managed Care – PPO | Admitting: Internal Medicine

## 2020-07-24 DIAGNOSIS — R350 Frequency of micturition: Secondary | ICD-10-CM | POA: Diagnosis not present

## 2020-07-24 DIAGNOSIS — N3010 Interstitial cystitis (chronic) without hematuria: Secondary | ICD-10-CM | POA: Diagnosis not present

## 2020-07-24 DIAGNOSIS — R300 Dysuria: Secondary | ICD-10-CM | POA: Diagnosis not present

## 2020-07-24 DIAGNOSIS — R102 Pelvic and perineal pain: Secondary | ICD-10-CM | POA: Diagnosis not present

## 2020-07-30 ENCOUNTER — Encounter: Payer: Self-pay | Admitting: Family Medicine

## 2020-07-30 ENCOUNTER — Other Ambulatory Visit: Payer: Self-pay

## 2020-07-30 ENCOUNTER — Ambulatory Visit (INDEPENDENT_AMBULATORY_CARE_PROVIDER_SITE_OTHER): Payer: BC Managed Care – PPO

## 2020-07-30 ENCOUNTER — Telehealth: Payer: Self-pay | Admitting: Family Medicine

## 2020-07-30 DIAGNOSIS — N3010 Interstitial cystitis (chronic) without hematuria: Secondary | ICD-10-CM

## 2020-07-30 MED ORDER — SODIUM BICARBONATE 8.4 % IV SOLN
5.0000 mL | Freq: Once | INTRAVENOUS | Status: AC
  Administered 2020-07-30: 5 mL

## 2020-07-30 MED ORDER — LIDOCAINE HCL 2 % IJ SOLN
20.0000 mL | Freq: Once | INTRAMUSCULAR | Status: AC
  Administered 2020-07-30: 400 mg

## 2020-07-30 MED ORDER — HEPARIN SODIUM (PORCINE) 10000 UNIT/ML IJ SOLN
10000.0000 [IU] | Freq: Once | INTRAMUSCULAR | Status: AC
  Administered 2020-07-23: 10000 [IU] via INTRAVESICAL

## 2020-07-30 MED ORDER — BUPIVACAINE HCL (PF) 0.25 % IJ SOLN
20.0000 mL | Freq: Once | INTRAMUSCULAR | Status: AC
  Administered 2020-07-30: 20 mL

## 2020-07-30 MED ORDER — HEPARIN SODIUM (PORCINE) 10000 UNIT/ML IJ SOLN
10000.0000 [IU] | Freq: Once | INTRAMUSCULAR | Status: AC
  Administered 2020-07-30: 10000 [IU] via INTRAVESICAL

## 2020-07-30 NOTE — Telephone Encounter (Signed)
Pt is calling in stating that she is needing something to make her sleep due to the condition that she has and stated that Dr. Elease Hashimoto is very familiar with what is going on with her and would like to have something called in today or a call back.  Pharm:  [facility]

## 2020-07-30 NOTE — Progress Notes (Unsigned)
Gabriela Shepard is a 60 y.o. female came in for a bladder treatment and it went well. Pt said she has not heard anything from pain management. I called the office of Dr. Nicholaus Bloom and was told new pt appointments are 4 months out. Pt was notified of this information and was given their phone number to call. Pt would like to know of there is another person she can see with a sooner appt.

## 2020-07-30 NOTE — Progress Notes (Signed)
The patient was identified and verbally consented for the procedure.  The urethra was prepped with Betadine.  Pt cathed for 10cc A 16 Fr foley catheter was inserted and attached to a 60 mL syringe with the plunger removed.  The medication was slowly poured into the bladder via the syringe and foley.  The medication consisted of:  Lidocaine 2%, 73mL Bupivicaine 0.25%, 10,000 units/mL, 37mL  Heparin 43mL  Sodium Bicarbonate 8.4% 75mL   The foley was removed and the patient was asked to hold the liquids in her bladder for 30-60 minutes if possible.   Precautions were given and patient was instructed to call the office or on-call number for any concerns.

## 2020-07-30 NOTE — Addendum Note (Signed)
Addended by: Gaylyn Rong A on: 07/30/2020 12:33 PM   Modules accepted: Orders

## 2020-07-31 NOTE — Telephone Encounter (Signed)
Below message sent to patient via My Chart

## 2020-07-31 NOTE — Telephone Encounter (Signed)
This is complicated by the fact that she is on several medications with CNS depression effects (elavil, baclofen, diazepam, hydroxyzine).   We have to be careful what we use in combination.  Safest option would be to increase the hydroxyzine to 50 mg qhs.

## 2020-08-01 ENCOUNTER — Encounter: Payer: Self-pay | Admitting: Obstetrics and Gynecology

## 2020-08-04 ENCOUNTER — Encounter: Payer: Self-pay | Admitting: Obstetrics and Gynecology

## 2020-08-04 ENCOUNTER — Telehealth (HOSPITAL_BASED_OUTPATIENT_CLINIC_OR_DEPARTMENT_OTHER): Payer: Self-pay | Admitting: *Deleted

## 2020-08-04 NOTE — Telephone Encounter (Signed)
Patient states that after her last bladder instillation she had severe pain at her urethra. She states that it has improved since then but she is still experiencing some discomfort. She states that she has applied the lidocaine jelly to the urethra externally and did not see any improvement with that. She states that when the instillation was done it felt differently and requests that at her next visit a smaller catheter be used.  She also states that she has some vaginal burning as well.  Offered to have the patient come by the office to leave a urine sample to check for UTI. Pt states that she would just give one at her appt on Wednesday. Advised pt to call back if her s/s worsened and we could work her into the schedule tomorrow. Pt verbalized understanding.

## 2020-08-06 ENCOUNTER — Ambulatory Visit (INDEPENDENT_AMBULATORY_CARE_PROVIDER_SITE_OTHER): Payer: BC Managed Care – PPO | Admitting: Obstetrics and Gynecology

## 2020-08-06 ENCOUNTER — Other Ambulatory Visit: Payer: Self-pay

## 2020-08-06 VITALS — BP 144/90 | HR 87 | Wt 138.0 lb

## 2020-08-06 DIAGNOSIS — R300 Dysuria: Secondary | ICD-10-CM | POA: Diagnosis not present

## 2020-08-06 DIAGNOSIS — N3010 Interstitial cystitis (chronic) without hematuria: Secondary | ICD-10-CM

## 2020-08-06 DIAGNOSIS — M62838 Other muscle spasm: Secondary | ICD-10-CM

## 2020-08-06 DIAGNOSIS — R102 Pelvic and perineal pain: Secondary | ICD-10-CM | POA: Diagnosis not present

## 2020-08-06 DIAGNOSIS — F419 Anxiety disorder, unspecified: Secondary | ICD-10-CM | POA: Diagnosis not present

## 2020-08-06 MED ORDER — PHENAZOPYRIDINE HCL 95 MG PO TABS: 95.0000 mg | ORAL_TABLET | Freq: Three times a day (TID) | ORAL | 5 refills | Status: DC | PRN

## 2020-08-06 MED ORDER — LIDOCAINE HCL 2 % IJ SOLN
20.0000 mL | Freq: Once | INTRAMUSCULAR | Status: AC
  Administered 2020-08-06: 400 mg

## 2020-08-06 MED ORDER — BUPIVACAINE HCL (PF) 0.25 % IJ SOLN
10.0000 mL | Freq: Once | INTRAMUSCULAR | Status: AC
  Administered 2020-08-06: 10 mL

## 2020-08-06 MED ORDER — BUPIVACAINE HCL (PF) 0.25 % IJ SOLN
10.0000 mL | Freq: Once | INTRAMUSCULAR | Status: AC
  Administered 2020-08-06: 10 mL via PERCUTANEOUS

## 2020-08-06 MED ORDER — SODIUM BICARBONATE 8.4 % IV SOLN
5.0000 mL | Freq: Once | INTRAVENOUS | Status: AC
  Administered 2020-08-06: 5 mL

## 2020-08-06 MED ORDER — HEPARIN SODIUM (PORCINE) 10000 UNIT/ML IJ SOLN
10000.0000 [IU] | Freq: Once | INTRAMUSCULAR | Status: AC
  Administered 2020-08-06: 10000 [IU] via INTRAVESICAL

## 2020-08-06 MED ORDER — TRIAMCINOLONE ACETONIDE 40 MG/ML IJ SUSP
40.0000 mg | Freq: Once | INTRAMUSCULAR | Status: AC
Start: 2020-08-06 — End: 2020-08-06
  Administered 2020-08-06: 40 mg

## 2020-08-06 NOTE — Progress Notes (Signed)
Ms. ANYLAH SCHEIB is a 60 y.o. female who presents for levator trigger point injection and bladder instillation. She is requesting a prescription for pyridium. Also has had some urethral burning recently since the last instillation which did not improve with the lidocaine jelly. Since she was not able to get in with pain management that we referred her to, she found another physician through a friend and made an appointment for next week. Has been taking muscle relaxant prescribed by PCP and this has helped her symptoms. She also has been continuing with pelvic PT, pelvic floor massage at home and is incorporating relaxation techniques.   Vitals:   08/06/20 1328  BP: (!) 144/90  Pulse: 87   Gen: No distress, alert, oriented x3  Trigger point injections: series #2 The patient was positioned in dorsal lithotomy position.  The vaginal tissues were prepped with Hibiclens solution.  An injection of 10cc  of a mixture of 90% anesthetic (0.25% Bupivacaine) and 10% 40mg /ml Triamcinolone acetomide (Kenalog) was performed in multiple in the bilateral levator muscles, a total of 7 injection sites.  Pressure was held over bleeding areas until good hemostasis was achieved.    Bladder Instillation: series #5 The patient was identified and verbally consented for the procedure. The urethra was prepped with hibiclens. A 12 Fr foley catheter was inserted to drain the bladder. A 60 mL syringe with the medication was slowly poured into the bladder via the syringe and foley. The medication consisted of 72ml of Lidocaine 2%, 26mL of Bupivicaine 0.25%, 10,000 units/mL Heparin, 48mL Sodium Bicarbonate 8.4%. The foley was removed and the patient was asked to hold the liquids in her bladder for 30-60 minutes if possible.    Assessment/ Plan:  60yo F with interstitial cystitis, urethral pain and levator spasm  - Return in 1 week for trigger point injections and bladder instillation.  - Used a smaller catheter diameter  today and she felt this was more comfortable for the urethra. Will plan to use the same for future instillations.  - Rx for pyridium provided with refills - will send urine for culture today to r/o infection. Unable to do POC urine due to pyridium.    Jaquita Folds, MD  Time spent: I spent 15 minutes dedicated to the care of this patient on the date of this encounter to include pre-visit review of records, face-to-face time with the patient and post visit documentation and ordering medication/ testing. Additional time was spent for procedures.

## 2020-08-08 ENCOUNTER — Ambulatory Visit: Payer: BC Managed Care – PPO | Admitting: Physical Therapy

## 2020-08-08 ENCOUNTER — Telehealth: Payer: Self-pay | Admitting: Obstetrics and Gynecology

## 2020-08-08 DIAGNOSIS — R300 Dysuria: Secondary | ICD-10-CM

## 2020-08-08 DIAGNOSIS — N3000 Acute cystitis without hematuria: Secondary | ICD-10-CM

## 2020-08-08 LAB — URINE CULTURE

## 2020-08-08 MED ORDER — NITROFURANTOIN MONOHYD MACRO 100 MG PO CAPS
100.0000 mg | ORAL_CAPSULE | Freq: Two times a day (BID) | ORAL | 0 refills | Status: AC
Start: 2020-08-08 — End: 2020-08-13

## 2020-08-08 NOTE — Telephone Encounter (Signed)
Macrobid sent to pharmacy for UTI

## 2020-08-11 ENCOUNTER — Other Ambulatory Visit: Payer: Self-pay | Admitting: Family Medicine

## 2020-08-11 DIAGNOSIS — F4312 Post-traumatic stress disorder, chronic: Secondary | ICD-10-CM | POA: Diagnosis not present

## 2020-08-11 NOTE — Telephone Encounter (Signed)
Sent patient MyChart message to have her contact PCP for refill.

## 2020-08-13 ENCOUNTER — Other Ambulatory Visit: Payer: Self-pay | Admitting: Family Medicine

## 2020-08-13 ENCOUNTER — Ambulatory Visit (INDEPENDENT_AMBULATORY_CARE_PROVIDER_SITE_OTHER): Payer: BC Managed Care – PPO | Admitting: *Deleted

## 2020-08-13 ENCOUNTER — Other Ambulatory Visit: Payer: Self-pay

## 2020-08-13 DIAGNOSIS — R300 Dysuria: Secondary | ICD-10-CM | POA: Diagnosis not present

## 2020-08-13 DIAGNOSIS — N3010 Interstitial cystitis (chronic) without hematuria: Secondary | ICD-10-CM

## 2020-08-13 LAB — POCT URINALYSIS DIPSTICK
Appearance: NORMAL
Bilirubin, UA: NEGATIVE
Blood, UA: NEGATIVE
Glucose, UA: NEGATIVE
Ketones, UA: NEGATIVE
Leukocytes, UA: NEGATIVE
Nitrite, UA: NEGATIVE
Protein, UA: NEGATIVE
Spec Grav, UA: 1.01
Urobilinogen, UA: 0.2 U/dL
pH, UA: 6

## 2020-08-13 MED ORDER — LIDOCAINE HCL 2 % IJ SOLN
20.0000 mL | Freq: Once | INTRAMUSCULAR | Status: AC
  Administered 2020-08-13: 400 mg

## 2020-08-13 MED ORDER — BUPIVACAINE HCL (PF) 0.25 % IJ SOLN
20.0000 mL | Freq: Once | INTRAMUSCULAR | Status: AC
  Administered 2020-08-13: 20 mL

## 2020-08-13 MED ORDER — SODIUM BICARBONATE 8.4 % IV SOLN
5.0000 mL | Freq: Once | INTRAVENOUS | Status: AC
  Administered 2020-08-13: 5 mL

## 2020-08-13 MED ORDER — HEPARIN SODIUM (PORCINE) 10000 UNIT/ML IJ SOLN
10000.0000 [IU] | Freq: Once | INTRAMUSCULAR | Status: AC
  Administered 2020-08-13: 10000 [IU] via INTRAVESICAL

## 2020-08-13 NOTE — Progress Notes (Signed)
Pt here for bladder instillation. 12Fr catheter used lubricated with lidocaine jelly. Pt tolerated well.

## 2020-08-13 NOTE — Telephone Encounter (Signed)
Pharmacy is requesting a 90 day supply ,please advise

## 2020-08-15 ENCOUNTER — Encounter: Payer: Self-pay | Admitting: Family Medicine

## 2020-08-15 ENCOUNTER — Encounter: Payer: BC Managed Care – PPO | Admitting: Physical Therapy

## 2020-08-18 ENCOUNTER — Ambulatory Visit: Payer: BC Managed Care – PPO | Admitting: Physical Therapy

## 2020-08-19 ENCOUNTER — Encounter: Payer: Self-pay | Admitting: *Deleted

## 2020-08-20 ENCOUNTER — Ambulatory Visit: Payer: BC Managed Care – PPO

## 2020-08-21 ENCOUNTER — Other Ambulatory Visit: Payer: Self-pay

## 2020-08-22 ENCOUNTER — Encounter: Payer: BC Managed Care – PPO | Admitting: Physical Therapy

## 2020-08-22 ENCOUNTER — Ambulatory Visit: Payer: BC Managed Care – PPO | Admitting: Family Medicine

## 2020-08-22 ENCOUNTER — Encounter: Payer: Self-pay | Admitting: Family Medicine

## 2020-08-22 VITALS — BP 120/70 | HR 78 | Temp 97.5°F | Ht 64.0 in | Wt 135.4 lb

## 2020-08-22 DIAGNOSIS — R300 Dysuria: Secondary | ICD-10-CM | POA: Diagnosis not present

## 2020-08-22 DIAGNOSIS — Z01818 Encounter for other preprocedural examination: Secondary | ICD-10-CM | POA: Diagnosis not present

## 2020-08-22 DIAGNOSIS — K5903 Drug induced constipation: Secondary | ICD-10-CM | POA: Diagnosis not present

## 2020-08-22 LAB — POCT URINALYSIS DIPSTICK
Bilirubin, UA: NEGATIVE
Blood, UA: NEGATIVE
Glucose, UA: NEGATIVE
Ketones, UA: NEGATIVE
Nitrite, UA: POSITIVE
Protein, UA: NEGATIVE
Spec Grav, UA: 1.01 (ref 1.010–1.025)
Urobilinogen, UA: 1 E.U./dL
pH, UA: 7 (ref 5.0–8.0)

## 2020-08-22 LAB — CBC WITH DIFFERENTIAL/PLATELET
Basophils Absolute: 0.1 10*3/uL (ref 0.0–0.1)
Basophils Relative: 0.9 % (ref 0.0–3.0)
Eosinophils Absolute: 0.1 10*3/uL (ref 0.0–0.7)
Eosinophils Relative: 1.7 % (ref 0.0–5.0)
HCT: 36 % (ref 36.0–46.0)
Hemoglobin: 12.1 g/dL (ref 12.0–15.0)
Lymphocytes Relative: 22.7 % (ref 12.0–46.0)
Lymphs Abs: 1.4 10*3/uL (ref 0.7–4.0)
MCHC: 33.7 g/dL (ref 30.0–36.0)
MCV: 88.9 fl (ref 78.0–100.0)
Monocytes Absolute: 0.6 10*3/uL (ref 0.1–1.0)
Monocytes Relative: 8.8 % (ref 3.0–12.0)
Neutro Abs: 4.2 10*3/uL (ref 1.4–7.7)
Neutrophils Relative %: 65.9 % (ref 43.0–77.0)
Platelets: 352 10*3/uL (ref 150.0–400.0)
RBC: 4.05 Mil/uL (ref 3.87–5.11)
RDW: 13.6 % (ref 11.5–15.5)
WBC: 6.3 10*3/uL (ref 4.0–10.5)

## 2020-08-22 LAB — BASIC METABOLIC PANEL
BUN: 11 mg/dL (ref 6–23)
CO2: 29 mEq/L (ref 19–32)
Calcium: 9.1 mg/dL (ref 8.4–10.5)
Chloride: 96 mEq/L (ref 96–112)
Creatinine, Ser: 0.64 mg/dL (ref 0.40–1.20)
GFR: 96.55 mL/min (ref >60.00)
Glucose, Bld: 91 mg/dL (ref 70–99)
Potassium: 4.6 mEq/L (ref 3.5–5.1)
Sodium: 131 mEq/L — ABNORMAL LOW (ref 135–145)

## 2020-08-22 LAB — PROTIME-INR
INR: 1 ratio (ref 0.8–1.0)
Prothrombin Time: 10.9 s (ref 9.6–13.1)

## 2020-08-22 MED ORDER — TRAMADOL HCL 50 MG PO TABS: 50.0000 mg | ORAL_TABLET | Freq: Four times a day (QID) | ORAL | 0 refills | Status: DC | PRN

## 2020-08-22 MED ORDER — NITROFURANTOIN MONOHYD MACRO 100 MG PO CAPS: 100.0000 mg | ORAL_CAPSULE | Freq: Two times a day (BID) | ORAL | 0 refills | Status: DC

## 2020-08-22 NOTE — Progress Notes (Signed)
Established Patient Office Visit  Subjective:  Patient ID: Gabriela Shepard, female    DOB: 1960-08-21  Age: 60 y.o. MRN: [id number]  CC:  Chief Complaint  Patient presents with  . surgical clearance    HPI Gabriela Shepard presents for presurgical clearance for right anterior hip arthroplasty on April 7.  She has history of migraine headaches, irritable bowel syndrome, osteopenia, interstitial cystitis, recurrent depression.  She has had recent issues with interstitial cystitis.  Was placed on multiple medications including Elavil.  She has had some severe constipation issues related that.  There apparently was some discussion of reducing her dose to 25 mg.  Fortunately, her bladder symptoms are some improved at this time.  She does relate some dysuria with burning with urination and frequency.  Had recent E. coli UTI treated with Macrobid per urology.  Symptoms started few days ago.  No fevers or chills.  No gross hematuria.  No history of chronic heart or lung disease.  No recent chest pains.  No recent new neurologic symptoms.  Past Medical History:  Diagnosis Date  . Arthritis   . Bowel habit changes    been going on couple years  . Depression   . Flatulence    excessive with strong odor/uncontrollable  . Headache   . 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 History of UTI   . Migraines   . PONV (postoperative nausea and vomiting)   . PTSD (post-traumatic stress disorder)   . STD (sexually transmitted disease)   . Substance abuse ([place])   . Urine incontinence     Past Surgical History:  Procedure Laterality Date  . BREAST BIOPSY Left    2017 benign x 2  . CESAREAN SECTION  1992  . COLONOSCOPY    . INCONTINENCE SURGERY    . LAPAROSCOPY     under bladder  . NECK SURGERY  02/24/2018   ACDF  . TOTAL HIP ARTHROPLASTY     left  . TOTAL KNEE ARTHROPLASTY Left 12/23/2015   Procedure: TOTAL KNEE ARTHROPLASTY;  Surgeon: Melrose Nakayama, MD;  Location:  [facility];  Service: Orthopedics;  Laterality: Left;    Family History  Problem Relation Age of Onset  . Hypertension Mother   . Lung cancer Mother   . Alcohol abuse Other   . Arthritis Other   . Hypertension Other   . Mental illness Other   . Kidney cancer Father   . Ulcers Father   . Drug abuse Sister   . Hypertension Maternal Grandmother   . Stomach cancer Maternal Uncle   . Colon cancer Neg Hx   . Esophageal cancer Neg Hx     Social History   Socioeconomic History  . Marital status: Divorced    Spouse name: Not on file  . Number of children: Not on file  . Years of education: Not on file  . Highest education level: Not on file  Occupational History  . Not on file  Tobacco Use  . Smoking status: Former Smoker    Packs/day: 0.50    Years: 10.00    Pack years: 5.00    Types: Cigarettes    Quit date: 11/13/2010    Years since quitting: 9.7  . Smokeless tobacco: Never Used  Vaping Use  . Vaping Use: Never used  Substance and Sexual Activity  . Alcohol use: No  . Drug use: No  . Sexual activity: Not Currently  Birth control/protection: Post-menopausal  Other Topics Concern  . Not on file  Social History Narrative  . Not on file   Social Determinants of Health   Financial Resource Strain: Not on file  Food Insecurity: Not on file  Transportation Needs: Not on file  Physical Activity: Not on file  Stress: Not on file  Social Connections: Not on file  Intimate Partner Violence: Not on file    Outpatient Medications Prior to Visit  Medication Sig Dispense Refill  . AIMOVIG 70 MG/ML SOAJ INJECT 70 MG INTO THE SKIN EVERY 30 (THIRTY) DAYS. (Patient taking differently: Inject 70 mg as directed every 30 (thirty) days.) 6 pen 11  . amitriptyline (ELAVIL) 50 MG tablet TAKE 1 TABLET (50 MG TOTAL) BY MOUTH AT BEDTIME. START WEEK 3 OF TREATMENT 90 tablet 2  . bupivacaine (MARCAINE) 0.5 % injection 20 mLs by Infiltration route as needed (for bladder instillation). Bring  to office for bladder instillation 50 mL 2  . Cholecalciferol (VITAMIN D3) 1.25 MG (50000 UT) CAPS TAKE 1 CAPSULE BY MOUTH ONE TIME PER WEEK 12 capsule 3  . clotrimazole-betamethasone (LOTRISONE) cream APPLY TO AFFECTED AREA TWICE A DAY 30 g 2  . diazepam (VALIUM) 5 MG tablet Place 1 tablet vaginally nightly as needed for muscle spasm/ pelvic pain. 30 tablet 0  . diazepam (VALIUM) 5 MG tablet Place up to 2 tablets daily as needed for muscle spasm/ pelvic pain. 30 tablet 0  . escitalopram (LEXAPRO) 10 MG tablet TAKE 1 TABLET BY MOUTH EVERY DAY 90 tablet 2  . esomeprazole (NEXIUM) 40 MG capsule TAKE 1 CAPSULE BY MOUTH EVERY DAY 90 capsule 3  . heparin 10000 UNIT/ML injection Inject 1 mL (10,000 Units total) into the skin as needed (bladder instillations). Bring to office for bladder instillation 5 mL 0  . hydrOXYzine (ATARAX/VISTARIL) 25 MG tablet Take 1 tablet (25 mg total) by mouth at bedtime. 30 tablet 5  . lidocaine (XYLOCAINE) 2 % jelly Place 1 application into the urethra as needed. 30 mL 0  . Meth-Hyo-M Bl-Na Phos-Ph Sal (URO-MP) 118 MG CAPS Take by mouth.    . Methenamine-Sodium Salicylate (CYSTEX PO) Take by mouth.    . Multiple Vitamin (MULTIVITAMIN WITH MINERALS) TABS tablet Take 1 tablet by mouth daily.    . ondansetron (ZOFRAN ODT) 4 MG disintegrating tablet Take 1 tablet (4 mg total) by mouth every 8 (eight) hours as needed for nausea or vomiting. 20 tablet 0  . phenazopyridine (PYRIDIUM) 200 MG tablet Take 1 tablet (200 mg total) by mouth 3 (three) times daily as needed. 6 tablet 0  . phenazopyridine (PYRIDIUM) 95 MG tablet Take 1 tablet (95 mg total) by mouth 3 (three) times daily as needed for pain. 30 tablet 5  . sodium bicarbonate 8.4 % SOLN Irrigate with 5 mLs as directed as needed (for bladder instillation). Bring to office for bladder instillation 50 mL 0  . baclofen (LIORESAL) 10 MG tablet Take 1 tablet (10 mg total) by mouth 3 (three) times daily. 30 each 0    Facility-Administered Medications Prior to Visit  Medication Dose Route Frequency Provider Last Rate Last Admin  . heparin sodium (porcine) 1,000 Units in sodium chloride 0.9 % 500 mL irrigation   Irrigation Once Jaquita Folds, MD        Allergies  Allergen Reactions  . Sulfa Antibiotics Hives    ROS Review of Systems  Constitutional: Negative for fatigue and unexpected weight change.  Eyes: Negative for visual disturbance.  Respiratory: Negative for cough, chest tightness, shortness of breath and wheezing.   Cardiovascular: Negative for chest pain, palpitations and leg swelling.  Gastrointestinal: Positive for constipation. Negative for abdominal distention.  Genitourinary: Positive for dysuria.  Neurological: Negative for dizziness, seizures, syncope, weakness, light-headedness and headaches.      Objective:    Physical Exam Vitals reviewed.  Constitutional:      Appearance: Normal appearance.  Neck:     Vascular: No carotid bruit.  Cardiovascular:     Rate and Rhythm: Normal rate and regular rhythm.     Heart sounds: No murmur heard.   Pulmonary:     Effort: Pulmonary effort is normal.     Breath sounds: Normal breath sounds.  Abdominal:     Palpations: Abdomen is soft. There is no mass.     Tenderness: There is no abdominal tenderness.  Musculoskeletal:     Cervical back: Neck supple.     Right lower leg: No edema.     Left lower leg: No edema.  Lymphadenopathy:     Cervical: No cervical adenopathy.  Neurological:     Mental Status: She is alert.     BP 120/70   Pulse 78   Temp (!) 97.5 F (36.4 C) (Oral)   Ht 5\' 4"  (1.626 m)   Wt 135 lb 6 oz (61.4 kg)   LMP 06/07/2010 (Approximate)   SpO2 99%   BMI 23.24 kg/m  Wt Readings from Last 3 Encounters:  08/22/20 135 lb 6 oz (61.4 kg)  08/06/20 138 lb (62.6 kg)  07/23/20 138 lb (62.6 kg)     Health Maintenance Due  Topic Date Due  . COVID-19 Vaccine (1) Never done  . HIV Screening   Never done  . COLONOSCOPY (Pts 45-9yrs Insurance coverage will need to be confirmed)  05/03/2020    There are no preventive care reminders to display for this patient.  Lab Results  Component Value Date   TSH 1.85 08/15/2019   Lab Results  Component Value Date   WBC 7.2 09/26/2019   HGB 14.8 09/26/2019   HCT 44.8 09/26/2019   MCV 89.2 09/26/2019   PLT 382 09/26/2019   Lab Results  Component Value Date   NA 133 (L) 09/26/2019   K 4.4 09/26/2019   CO2 25 09/26/2019   GLUCOSE 122 (H) 09/26/2019   BUN 8 09/26/2019   CREATININE 0.73 09/26/2019   BILITOT 0.5 09/26/2019   ALKPHOS 46 09/26/2019   AST 45 (H) 09/26/2019   ALT 41 09/26/2019   PROT 8.2 (H) 09/26/2019   ALBUMIN 4.3 09/26/2019   CALCIUM 9.2 09/26/2019   ANIONGAP 12 09/26/2019   GFR 81.70 08/06/2019   Lab Results  Component Value Date   CHOL 239 (H) 04/30/2019   Lab Results  Component Value Date   HDL 65.20 04/30/2019   Lab Results  Component Value Date   LDLCALC 148 (H) 04/30/2019   Lab Results  Component Value Date   TRIG 128.0 04/30/2019   Lab Results  Component Value Date   CHOLHDL 4 04/30/2019   Lab Results  Component Value Date   HGBA1C 5.9 05/02/2017      Assessment & Plan:   #1 preoperative clearance for right total hip replacement.  EKG shows sinus rhythm with no acute ST-T changes.  No known contraindications at this time.  Forms to be completed for surgery.  She needs basic metabolic panel, CBC, INR and these will be obtained today  #2 probable recurrent cystitis.  Urine dipstick suggest probably infection.  Send urine culture.  Start Macrobid 1 twice daily for 7 days pending culture results  #3 constipation.  Probably exacerbated by amitriptyline -She will try cutting this back to 25 mg daily.  Continue plenty of fluid and fiber.  Continue MiraLAX as needed.  Meds ordered this encounter  Medications  . nitrofurantoin, macrocrystal-monohydrate, (MACROBID) 100 MG capsule    Sig:  Take 1 capsule (100 mg total) by mouth 2 (two) times daily.    Dispense:  14 capsule    Refill:  0  . traMADol (ULTRAM) 50 MG tablet    Sig: Take 1 tablet (50 mg total) by mouth every 6 (six) hours as needed for up to 5 days.    Dispense:  20 tablet    Refill:  0    Follow-up: No follow-ups on file.    Carolann Littler, MD

## 2020-08-22 NOTE — Patient Instructions (Signed)
Urinary Tract Infection, Adult  A urinary tract infection (UTI) is an infection of any part of the urinary tract. The urinary tract includes the kidneys, ureters, bladder, and urethra. These organs make, store, and get rid of urine in the body. An upper UTI affects the ureters and kidneys. A lower UTI affects the bladder and urethra. What are the causes? Most urinary tract infections are caused by bacteria in your genital area around your urethra, where urine leaves your body. These bacteria grow and cause inflammation of your urinary tract. What increases the risk? You are more likely to develop this condition if:  You have a urinary catheter that stays in place.  You are not able to control when you urinate or have a bowel movement (incontinence).  You are female and you: ? Use a spermicide or diaphragm for birth control. ? Have low estrogen levels. ? Are pregnant.  You have certain genes that increase your risk.  You are sexually active.  You take antibiotic medicines.  You have a condition that causes your flow of urine to slow down, such as: ? An enlarged prostate, if you are female. ? Blockage in your urethra. ? A kidney stone. ? A nerve condition that affects your bladder control (neurogenic bladder). ? Not getting enough to drink, or not urinating often.  You have certain medical conditions, such as: ? Diabetes. ? A weak disease-fighting system (immunesystem). ? Sickle cell disease. ? Gout. ? Spinal cord injury. What are the signs or symptoms? Symptoms of this condition include:  Needing to urinate right away (urgency).  Frequent urination. This may include small amounts of urine each time you urinate.  Pain or burning with urination.  Blood in the urine.  Urine that smells bad or unusual.  Trouble urinating.  Cloudy urine.  Vaginal discharge, if you are female.  Pain in the abdomen or the lower back. You may also have:  Vomiting or a decreased  appetite.  Confusion.  Irritability or tiredness.  A fever or chills.  Diarrhea. The first symptom in older adults may be confusion. In some cases, they may not have any symptoms until the infection has worsened. How is this diagnosed? This condition is diagnosed based on your medical history and a physical exam. You may also have other tests, including:  Urine tests.  Blood tests.  Tests for STIs (sexually transmitted infections). If you have had more than one UTI, a cystoscopy or imaging studies may be done to determine the cause of the infections. How is this treated? Treatment for this condition includes:  Antibiotic medicine.  Over-the-counter medicines to treat discomfort.  Drinking enough water to stay hydrated. If you have frequent infections or have other conditions such as a kidney stone, you may need to see a health care provider who specializes in the urinary tract (urologist). In rare cases, urinary tract infections can cause sepsis. Sepsis is a life-threatening condition that occurs when the body responds to an infection. Sepsis is treated in the hospital with IV antibiotics, fluids, and other medicines. Follow these instructions at home: Medicines  Take over-the-counter and prescription medicines only as told by your health care provider.  If you were prescribed an antibiotic medicine, take it as told by your health care provider. Do not stop using the antibiotic even if you start to feel better. General instructions  Make sure you: ? Empty your bladder often and completely. Do not hold urine for long periods of time. ? Empty your bladder after   sex. ? Wipe from front to back after urinating or having a bowel movement if you are female. Use each tissue only one time when you wipe.  Drink enough fluid to keep your urine pale yellow.  Keep all follow-up visits. This is important.   Contact a health care provider if:  Your symptoms do not get better after 1-2  days.  Your symptoms go away and then return. Get help right away if:  You have severe pain in your back or your lower abdomen.  You have a fever or chills.  You have nausea or vomiting. Summary  A urinary tract infection (UTI) is an infection of any part of the urinary tract, which includes the kidneys, ureters, bladder, and urethra.  Most urinary tract infections are caused by bacteria in your genital area.  Treatment for this condition often includes antibiotic medicines.  If you were prescribed an antibiotic medicine, take it as told by your health care provider. Do not stop using the antibiotic even if you start to feel better.  Keep all follow-up visits. This is important. This information is not intended to replace advice given to you by your health care provider. Make sure you discuss any questions you have with your health care provider. Document Revised: 01/04/2020 Document Reviewed: 01/04/2020 Elsevier Patient Education  2021 [employer] Inc.  

## 2020-08-23 LAB — URINE CULTURE
MICRO NUMBER:: 11665232
Result:: NO GROWTH
SPECIMEN QUALITY:: ADEQUATE

## 2020-08-25 ENCOUNTER — Encounter: Payer: Self-pay | Admitting: Family Medicine

## 2020-08-26 DIAGNOSIS — F4312 Post-traumatic stress disorder, chronic: Secondary | ICD-10-CM | POA: Diagnosis not present

## 2020-08-27 ENCOUNTER — Encounter: Payer: Self-pay | Admitting: Obstetrics and Gynecology

## 2020-08-27 ENCOUNTER — Ambulatory Visit (INDEPENDENT_AMBULATORY_CARE_PROVIDER_SITE_OTHER): Payer: BC Managed Care – PPO | Admitting: Obstetrics and Gynecology

## 2020-08-27 ENCOUNTER — Other Ambulatory Visit: Payer: Self-pay

## 2020-08-27 VITALS — BP 128/86 | HR 89 | Ht 64.0 in | Wt 135.0 lb

## 2020-08-27 DIAGNOSIS — N3010 Interstitial cystitis (chronic) without hematuria: Secondary | ICD-10-CM | POA: Diagnosis not present

## 2020-08-27 DIAGNOSIS — N942 Vaginismus: Secondary | ICD-10-CM | POA: Diagnosis not present

## 2020-08-27 DIAGNOSIS — M25551 Pain in right hip: Secondary | ICD-10-CM | POA: Diagnosis not present

## 2020-08-27 DIAGNOSIS — Z87440 Personal history of urinary (tract) infections: Secondary | ICD-10-CM | POA: Diagnosis not present

## 2020-08-27 DIAGNOSIS — R102 Pelvic and perineal pain: Secondary | ICD-10-CM | POA: Diagnosis not present

## 2020-08-27 LAB — POCT URINALYSIS DIPSTICK
Appearance: NORMAL
Bilirubin, UA: NEGATIVE
Blood, UA: NEGATIVE
Glucose, UA: NEGATIVE
Ketones, UA: NEGATIVE
Leukocytes, UA: NEGATIVE
Nitrite, UA: NEGATIVE
Protein, UA: NEGATIVE
Spec Grav, UA: 1.01 (ref 1.010–1.025)
Urobilinogen, UA: 0.2 E.U./dL
pH, UA: 5 (ref 5.0–8.0)

## 2020-08-27 MED ORDER — SODIUM BICARBONATE 8.4 % IV SOLN
5.0000 mL | Freq: Once | INTRAVENOUS | Status: AC
  Administered 2020-08-27: 5 mL

## 2020-08-27 MED ORDER — HEPARIN SODIUM (PORCINE) 10000 UNIT/ML IJ SOLN
10000.0000 [IU] | Freq: Once | INTRAMUSCULAR | Status: AC
Start: 2020-08-27 — End: 2020-08-27
  Administered 2020-08-27: 10000 [IU] via INTRAVESICAL

## 2020-08-27 MED ORDER — LIDOCAINE HCL URETHRAL/MUCOSAL 2 % EX GEL
1.0000 "application " | Freq: Once | CUTANEOUS | Status: AC
  Administered 2020-08-27: 1 via TOPICAL

## 2020-08-27 MED ORDER — LIDOCAINE HCL 2 % IJ SOLN
20.0000 mL | Freq: Once | INTRAMUSCULAR | Status: AC
  Administered 2020-08-27: 400 mg

## 2020-08-27 MED ORDER — BUPIVACAINE HCL (PF) 0.25 % IJ SOLN
20.0000 mL | Freq: Once | INTRAMUSCULAR | Status: AC
  Administered 2020-08-27: 20 mL

## 2020-08-27 MED ORDER — TRIAMCINOLONE ACETONIDE 40 MG/ML IJ SUSP
40.0000 mg | Freq: Once | INTRAMUSCULAR | Status: AC
  Administered 2020-08-27: 40 mg

## 2020-08-27 NOTE — Patient Instructions (Signed)
Taking Care of Yourself after Urodynamics, Cystoscopy, Coaptite Injection, or Botox Injection   Drink plenty of water for a day or two following your procedure. Try to have about 8 ounces (one cup) at a time, and do this 6 times or more per day unless you have fluid restrictitons AVOID irritative beverages such as coffee, tea, soda, alcoholic or citrus drinks for a day or two, as this may cause burning with urination.  For the first 1-2 days after the procedure, your urine may be pink or red in color. You may have some blood in your urine as a normal side effect of the procedure. Large amounts of bleeding or difficulty urinating are NOT normal. Call the nurse line if this happens or go to the nearest Emergency Room if the bleeding is heavy or you cannot urinate at all and it is after hours.  You may experience some discomfort or a burning sensation with urination after having this procedure. You can use over the counter Azo or pyridium to help with burning and follow the instructions on the packaging. If it does not improve within 1-2 days, or other symptoms appear (fever, chills, or difficulty urinating) call the office to speak to a nurse.  You may return to normal daily activities such as work, school, driving, exercising and housework on the day of the procedure. If your doctor gave you a prescription, take it as ordered.  If you need a return appointment, the front desk staff will arrange it when you check out.   

## 2020-08-27 NOTE — Progress Notes (Signed)
Procedure  CC:  This is a 60 y.o. with bladder pain and history of midurethral sling who presents today for cystoscopy.  POC urine: negative  BP 128/86   Pulse 89   Ht 5\' 4"  (1.626 m)   Wt 135 lb (61.2 kg)   LMP 06/07/2010 (Approximate)   BMI 23.17 kg/m   CYSTOSCOPY: A time out was performed.  The periurethral area was prepped and draped in a sterile manner.  2% lidocaine jetpack was inserted at the urethral meatus. The urethra and bladder visualized with 0- and 70-degree scopes.  She had normal urethral coaptation and normal urethral mucosa.  She had normal bladder mucosa. She had bilateral clear efflux from both ureteral orifices.  She had no squamous metaplasia at the trigone, no trabeculations, cellules or diverticuli.     Bladder Instillation: The bladder was then drained with a 39F catheter.  The medication was slowly poured into the bladder via the syringe and foley. The medication consisted of 46ml of Lidocaine 2%, 60mL of Bupivicaine 0.25%, 10,000 units/mL Heparin, 109mL Sodium Bicarbonate 8.4% and 40mg  kenalog. The foley was removed and the patient was asked to hold the liquids in her bladder for 30-60 minutes if possible.   Precautions were given and patient was instructed to call the office or on-call number for any concerns.  ASSESSMENT:  60 y.o. with bladder pain. Cystoscopy today is normal.  PLAN:  - She will continue her pelvic floor physical therapy.  - She halved the dose of the amitriptyline to 25mg  since she was having some side effects of dry mouth and constipation.   Return in May after her hip surgery for follow up.   Jaquita Folds, MD

## 2020-08-28 ENCOUNTER — Other Ambulatory Visit: Payer: Self-pay | Admitting: Family Medicine

## 2020-08-29 ENCOUNTER — Encounter: Payer: BC Managed Care – PPO | Admitting: Physical Therapy

## 2020-09-01 DIAGNOSIS — M6281 Muscle weakness (generalized): Secondary | ICD-10-CM | POA: Diagnosis not present

## 2020-09-01 DIAGNOSIS — F4312 Post-traumatic stress disorder, chronic: Secondary | ICD-10-CM | POA: Diagnosis not present

## 2020-09-01 DIAGNOSIS — M25551 Pain in right hip: Secondary | ICD-10-CM | POA: Diagnosis not present

## 2020-09-01 DIAGNOSIS — M1611 Unilateral primary osteoarthritis, right hip: Secondary | ICD-10-CM | POA: Diagnosis not present

## 2020-09-02 ENCOUNTER — Encounter: Payer: Self-pay | Admitting: Obstetrics and Gynecology

## 2020-09-02 DIAGNOSIS — N952 Postmenopausal atrophic vaginitis: Secondary | ICD-10-CM

## 2020-09-03 MED ORDER — ESTRADIOL 2 MG VA RING: 2.0000 mg | VAGINAL_RING | VAGINAL | 3 refills | Status: DC

## 2020-09-04 ENCOUNTER — Encounter: Payer: Self-pay | Admitting: Internal Medicine

## 2020-09-04 MED ORDER — ESTRADIOL 10 MCG VA TABS: 1.0000 | ORAL_TABLET | VAGINAL | 12 refills | Status: DC

## 2020-09-04 NOTE — Addendum Note (Signed)
Addended by: Jaquita Folds on: 09/04/2020 10:33 AM   Modules accepted: Orders

## 2020-09-04 NOTE — Progress Notes (Addendum)
Submitted PA for [facility] on Cover my Meds. PA Case ID: [id number]   Rx #: [id number]   Status: Approved  Prior Auth: Coverage Start Date:09/04/20; Coverage End Date:09/04/21  Pharmacy notified of approval. LM on the patients VM for her to call back re: Approved medication --- Submitted PA for Gabriela Shepard is a 60 y.o. female  on Cover my Meds. KeyPhyllis Ginger   Rx #: [id number]   Status: PENDING  Your information has been submitted to [employer]. [employer] is reviewing the PA request and you will receive an electronic response. You may check for the updated outcome later by reopening this request. The standard fax determination will also be sent to you directly.  If you have any questions about your PA submission, contact [employer] at [phone].

## 2020-09-05 ENCOUNTER — Ambulatory Visit: Payer: BC Managed Care – PPO | Admitting: Obstetrics and Gynecology

## 2020-09-05 ENCOUNTER — Encounter: Payer: BC Managed Care – PPO | Admitting: Physical Therapy

## 2020-09-08 ENCOUNTER — Ambulatory Visit: Payer: BC Managed Care – PPO | Admitting: Family Medicine

## 2020-09-08 ENCOUNTER — Encounter: Payer: Self-pay | Admitting: Family Medicine

## 2020-09-08 ENCOUNTER — Other Ambulatory Visit: Payer: Self-pay

## 2020-09-08 VITALS — BP 138/64 | HR 79 | Temp 98.6°F | Wt 139.3 lb

## 2020-09-08 DIAGNOSIS — F4312 Post-traumatic stress disorder, chronic: Secondary | ICD-10-CM | POA: Diagnosis not present

## 2020-09-08 DIAGNOSIS — H9201 Otalgia, right ear: Secondary | ICD-10-CM

## 2020-09-08 DIAGNOSIS — R300 Dysuria: Secondary | ICD-10-CM | POA: Diagnosis not present

## 2020-09-08 DIAGNOSIS — J029 Acute pharyngitis, unspecified: Secondary | ICD-10-CM

## 2020-09-08 HISTORY — PX: TOTAL HIP ARTHROPLASTY: SHX124

## 2020-09-08 LAB — POCT URINALYSIS DIPSTICK
Bilirubin, UA: NEGATIVE
Blood, UA: NEGATIVE
Glucose, UA: NEGATIVE
Ketones, UA: NEGATIVE
Leukocytes, UA: NEGATIVE
Nitrite, UA: NEGATIVE
Protein, UA: NEGATIVE
Spec Grav, UA: 1.01 (ref 1.010–1.025)
Urobilinogen, UA: NEGATIVE E.U./dL — AB
pH, UA: 6.5 (ref 5.0–8.0)

## 2020-09-08 NOTE — Progress Notes (Signed)
Established Patient Office Visit  Subjective:  Patient ID: Gabriela Shepard, female    DOB: 02-Nov-1960  Age: 59 y.o. MRN: [id number]  CC:  Chief Complaint  Patient presents with  . Ear Pain    R ear pain, drainage, scratchy throat    HPI Gabriela Shepard presents for right earache and "scratchy throat ".  She has upcoming total hip replacement this Thursday and is concerned to get checked out prior to then.  She states her symptoms started a few days ago.  No fever.  No chills.  She is not seeing any purulent secretions.  She has had some right neck discomfort and right ear discomfort predominantly.  No hearing changes.  No drainage.  No cough or dyspnea.  She has interstitial cystitis and has some chronic burning.  She is specifically requesting urine dipstick and culture if indicated to rule out infection prior to her surgery.  Past Medical History:  Diagnosis Date  . Arthritis   . Bowel habit changes    been going on couple years  . Depression   . Flatulence    excessive with strong odor/uncontrollable  . Headache   . 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 History of UTI   . Migraines   . PONV (postoperative nausea and vomiting)   . PTSD (post-traumatic stress disorder)   . STD (sexually transmitted disease)   . Substance abuse ([place])   . Urine incontinence     Past Surgical History:  Procedure Laterality Date  . BREAST BIOPSY Left    2017 benign x 2  . CESAREAN SECTION  1992  . COLONOSCOPY    . INCONTINENCE SURGERY    . LAPAROSCOPY     under bladder  . NECK SURGERY  02/24/2018   ACDF  . TOTAL HIP ARTHROPLASTY     left  . TOTAL KNEE ARTHROPLASTY Left 12/23/2015   Procedure: TOTAL KNEE ARTHROPLASTY;  Surgeon: Melrose Nakayama, MD;  Location: [facility];  Service: Orthopedics;  Laterality: Left;    Family History  Problem Relation Age of Onset  . Hypertension Mother   . Lung cancer Mother   . Alcohol abuse Other   . Arthritis Other   .  Hypertension Other   . Mental illness Other   . Kidney cancer Father   . Ulcers Father   . Drug abuse Sister   . Hypertension Maternal Grandmother   . Stomach cancer Maternal Uncle   . Colon cancer Neg Hx   . Esophageal cancer Neg Hx     Social History   Socioeconomic History  . Marital status: Divorced    Spouse name: Not on file  . Number of children: Not on file  . Years of education: Not on file  . Highest education level: Not on file  Occupational History  . Not on file  Tobacco Use  . Smoking status: Former Smoker    Packs/day: 0.50    Years: 10.00    Pack years: 5.00    Types: Cigarettes    Quit date: 11/13/2010    Years since quitting: 9.8  . Smokeless tobacco: Never Used  Vaping Use  . Vaping Use: Never used  Substance and Sexual Activity  . Alcohol use: No  . Drug use: No  . Sexual activity: Not Currently    Birth control/protection: Post-menopausal  Other Topics Concern  . Not on file  Social History Narrative  . Not on file  Social Determinants of Health   Financial Resource Strain: Not on file  Food Insecurity: Not on file  Transportation Needs: Not on file  Physical Activity: Not on file  Stress: Not on file  Social Connections: Not on file  Intimate Partner Violence: Not on file    Outpatient Medications Prior to Visit  Medication Sig Dispense Refill  . AIMOVIG 70 MG/ML SOAJ INJECT 70 MG INTO THE SKIN EVERY 30 (THIRTY) DAYS. (Patient taking differently: Inject 70 mg as directed every 30 (thirty) days.) 6 pen 11  . amitriptyline (ELAVIL) 50 MG tablet TAKE 1 TABLET (50 MG TOTAL) BY MOUTH AT BEDTIME. START WEEK 3 OF TREATMENT 90 tablet 2  . bupivacaine (MARCAINE) 0.5 % injection 20 mLs by Infiltration route as needed (for bladder instillation). Bring to office for bladder instillation 50 mL 2  . bupivacaine (MARCAINE) 0.5 % SOLN injection 20 MLS BY INFILTRATION ROUTE AS NEEDED (FOR BLADDER INSTILLATION). BRING TO OFFICE FOR BLADDER INSTILLATION  1500 mL 2  . Cholecalciferol (VITAMIN D3) 1.25 MG (50000 UT) CAPS TAKE 1 CAPSULE BY MOUTH ONE TIME PER WEEK 12 capsule 3  . clotrimazole-betamethasone (LOTRISONE) cream APPLY TO AFFECTED AREA TWICE A DAY 30 g 2  . diazepam (VALIUM) 5 MG tablet Place 1 tablet vaginally nightly as needed for muscle spasm/ pelvic pain. 30 tablet 0  . diazepam (VALIUM) 5 MG tablet Place up to 2 tablets daily as needed for muscle spasm/ pelvic pain. 30 tablet 0  . escitalopram (LEXAPRO) 10 MG tablet TAKE 1 TABLET BY MOUTH EVERY DAY 90 tablet 2  . esomeprazole (NEXIUM) 40 MG capsule TAKE 1 CAPSULE BY MOUTH EVERY DAY 90 capsule 3  . Estradiol 10 MCG TABS vaginal tablet Place 1 tablet (10 mcg total) vaginally 2 (two) times a week. Place 1 tab nightly for two weeks then twice a week after 8 tablet 12  . heparin 10000 UNIT/ML injection INJECT 1 ML (10,000 UNITS TOTAL) INTO THE SKIN AS NEEDED (BLADDER INSTILLATIONS). BRING TO OFFICE FOR BLADDER INSTILLATION 5 mL 0  . hydrOXYzine (ATARAX/VISTARIL) 25 MG tablet Take 1 tablet (25 mg total) by mouth at bedtime. 30 tablet 5  . lidocaine (XYLOCAINE) 2 % jelly Place 1 application into the urethra as needed. 30 mL 0  . Meth-Hyo-M Bl-Na Phos-Ph Sal (URO-MP) 118 MG CAPS Take by mouth.    . Methenamine-Sodium Salicylate (CYSTEX PO) Take by mouth.    . Multiple Vitamin (MULTIVITAMIN WITH MINERALS) TABS tablet Take 1 tablet by mouth daily.    . nitrofurantoin, macrocrystal-monohydrate, (MACROBID) 100 MG capsule Take 1 capsule (100 mg total) by mouth 2 (two) times daily. 14 capsule 0  . ondansetron (ZOFRAN ODT) 4 MG disintegrating tablet Take 1 tablet (4 mg total) by mouth every 8 (eight) hours as needed for nausea or vomiting. 20 tablet 0  . phenazopyridine (PYRIDIUM) 200 MG tablet Take 1 tablet (200 mg total) by mouth 3 (three) times daily as needed. 6 tablet 0  . phenazopyridine (PYRIDIUM) 95 MG tablet Take 1 tablet (95 mg total) by mouth 3 (three) times daily as needed for pain. 30  tablet 5  . sodium bicarbonate 1 mEq/mL injection IRRIGATE WITH 5 MLS AS DIRECTED AS NEEDED (FOR BLADDER INSTILLATION). BRING TO OFFICE FOR BLADDER INSTILLATION 2500 mL 0  . sodium bicarbonate 8.4 % SOLN Irrigate with 5 mLs as directed as needed (for bladder instillation). Bring to office for bladder instillation 50 mL 0   Facility-Administered Medications Prior to Visit  Medication Dose Route  Frequency Provider Last Rate Last Admin  . heparin sodium (porcine) 1,000 Units in sodium chloride 0.9 % 500 mL irrigation   Irrigation Once Jaquita Folds, MD        Allergies  Allergen Reactions  . Sulfa Antibiotics Hives    ROS Review of Systems  Constitutional: Negative for chills and fever.  HENT: Positive for ear pain and sore throat. Negative for ear discharge, hearing loss, sinus pressure and sinus pain.   Respiratory: Negative for cough and shortness of breath.   Cardiovascular: Negative for chest pain.  Neurological: Negative for headaches.      Objective:    Physical Exam Vitals reviewed.  Constitutional:      Appearance: Normal appearance.  HENT:     Right Ear: Tympanic membrane normal.     Left Ear: Tympanic membrane normal.     Mouth/Throat:     Mouth: Mucous membranes are moist.     Pharynx: Oropharynx is clear. No oropharyngeal exudate or posterior oropharyngeal erythema.  Cardiovascular:     Rate and Rhythm: Normal rate and regular rhythm.  Pulmonary:     Effort: Pulmonary effort is normal.     Breath sounds: Normal breath sounds.  Musculoskeletal:     Cervical back: Neck supple.  Lymphadenopathy:     Cervical: No cervical adenopathy.  Neurological:     Mental Status: She is alert.     BP 138/64 (BP Location: Right Arm, Patient Position: Sitting, Cuff Size: Normal)   Pulse 79   Temp 98.6 F (37 C) (Oral)   Wt 139 lb 4.8 oz (63.2 kg)   LMP 06/07/2010 (Approximate)   SpO2 99%   BMI 23.91 kg/m  Wt Readings from Last 3 Encounters:  09/08/20 139 lb  4.8 oz (63.2 kg)  08/27/20 135 lb (61.2 kg)  08/22/20 135 lb 6 oz (61.4 kg)     Health Maintenance Due  Topic Date Due  . COVID-19 Vaccine (1) Never done  . HIV Screening  Never done  . COLONOSCOPY (Pts 45-31yrs Insurance coverage will need to be confirmed)  05/03/2020    There are no preventive care reminders to display for this patient.  Lab Results  Component Value Date   TSH 1.85 08/15/2019   Lab Results  Component Value Date   WBC 6.3 08/22/2020   HGB 12.1 08/22/2020   HCT 36.0 08/22/2020   MCV 88.9 08/22/2020   PLT 352.0 08/22/2020   Lab Results  Component Value Date   NA 131 (L) 08/22/2020   K 4.6 08/22/2020   CO2 29 08/22/2020   GLUCOSE 91 08/22/2020   BUN 11 08/22/2020   CREATININE 0.64 08/22/2020   BILITOT 0.5 09/26/2019   ALKPHOS 46 09/26/2019   AST 45 (H) 09/26/2019   ALT 41 09/26/2019   PROT 8.2 (H) 09/26/2019   ALBUMIN 4.3 09/26/2019   CALCIUM 9.1 08/22/2020   ANIONGAP 12 09/26/2019   GFR 96.55 08/22/2020   Lab Results  Component Value Date   CHOL 239 (H) 04/30/2019   Lab Results  Component Value Date   HDL 65.20 04/30/2019   Lab Results  Component Value Date   LDLCALC 148 (H) 04/30/2019   Lab Results  Component Value Date   TRIG 128.0 04/30/2019   Lab Results  Component Value Date   CHOLHDL 4 04/30/2019   Lab Results  Component Value Date   HGBA1C 5.9 05/02/2017      Assessment & Plan:   #1 right otalgia.  Ear canal and  eardrum appear entirely normal.  No obvious effusion.  No erythema.  Reassurance that no sign of infection present at this time.  Recommend observe for now.  May have some mild eustachian tube dysfunction  #2 sore throat.  Exam normal.  No significant erythema or exudate.  No anterior cervical adenopathy.  No fever. -Follow-up promptly for any fever or any persistent or worsening symptoms  #3 chronic dysuria.  History of interstitial cystitis. -Urine dipstick checked per patient request which is  unremarkable.  No evidence for infection.  Reassurance given.  No orders of the defined types were placed in this encounter.   Follow-up: No follow-ups on file.    Carolann Littler, MD

## 2020-09-11 DIAGNOSIS — M1611 Unilateral primary osteoarthritis, right hip: Secondary | ICD-10-CM | POA: Diagnosis not present

## 2020-09-12 ENCOUNTER — Telehealth: Payer: Self-pay

## 2020-09-12 ENCOUNTER — Encounter: Payer: BC Managed Care – PPO | Admitting: Physical Therapy

## 2020-09-12 NOTE — Telephone Encounter (Signed)
Gabriela Shepard is a 60 y.o. female called in wanting to know which meds is she suppose to be taking.  Pt said she tapped herself off the amitriptyline and does not have baclofen. After speaking with Dr. Wannetta Sender pt was told to continue Hydroxyzine and Valium.  Pt verbalized understanding and was reminded of her future appt 10/15/20

## 2020-09-19 ENCOUNTER — Encounter: Payer: BC Managed Care – PPO | Admitting: Physical Therapy

## 2020-09-21 ENCOUNTER — Other Ambulatory Visit: Payer: Self-pay | Admitting: Family Medicine

## 2020-09-22 DIAGNOSIS — F4312 Post-traumatic stress disorder, chronic: Secondary | ICD-10-CM | POA: Diagnosis not present

## 2020-09-22 NOTE — Telephone Encounter (Signed)
Please advise. Rx is not on the patients current med list.

## 2020-09-26 ENCOUNTER — Encounter: Payer: BC Managed Care – PPO | Admitting: Physical Therapy

## 2020-09-29 ENCOUNTER — Other Ambulatory Visit: Payer: Self-pay | Admitting: Family Medicine

## 2020-09-29 NOTE — Telephone Encounter (Signed)
Please advise. Rx is not on the current med list 

## 2020-09-30 DIAGNOSIS — R2681 Unsteadiness on feet: Secondary | ICD-10-CM | POA: Diagnosis not present

## 2020-09-30 DIAGNOSIS — M25551 Pain in right hip: Secondary | ICD-10-CM | POA: Diagnosis not present

## 2020-09-30 DIAGNOSIS — R102 Pelvic and perineal pain: Secondary | ICD-10-CM | POA: Diagnosis not present

## 2020-10-02 DIAGNOSIS — R102 Pelvic and perineal pain: Secondary | ICD-10-CM | POA: Diagnosis not present

## 2020-10-02 DIAGNOSIS — M25551 Pain in right hip: Secondary | ICD-10-CM | POA: Diagnosis not present

## 2020-10-02 DIAGNOSIS — R2681 Unsteadiness on feet: Secondary | ICD-10-CM | POA: Diagnosis not present

## 2020-10-07 DIAGNOSIS — R2681 Unsteadiness on feet: Secondary | ICD-10-CM | POA: Diagnosis not present

## 2020-10-07 DIAGNOSIS — M25551 Pain in right hip: Secondary | ICD-10-CM | POA: Diagnosis not present

## 2020-10-07 DIAGNOSIS — R102 Pelvic and perineal pain: Secondary | ICD-10-CM | POA: Diagnosis not present

## 2020-10-09 DIAGNOSIS — R102 Pelvic and perineal pain: Secondary | ICD-10-CM | POA: Diagnosis not present

## 2020-10-09 DIAGNOSIS — R2681 Unsteadiness on feet: Secondary | ICD-10-CM | POA: Diagnosis not present

## 2020-10-09 DIAGNOSIS — M25551 Pain in right hip: Secondary | ICD-10-CM | POA: Diagnosis not present

## 2020-10-10 ENCOUNTER — Other Ambulatory Visit: Payer: Self-pay

## 2020-10-10 ENCOUNTER — Encounter: Payer: Self-pay | Admitting: Obstetrics and Gynecology

## 2020-10-10 ENCOUNTER — Ambulatory Visit (INDEPENDENT_AMBULATORY_CARE_PROVIDER_SITE_OTHER): Payer: BC Managed Care – PPO | Admitting: Obstetrics and Gynecology

## 2020-10-10 VITALS — BP 117/84 | HR 71

## 2020-10-10 DIAGNOSIS — M62838 Other muscle spasm: Secondary | ICD-10-CM | POA: Diagnosis not present

## 2020-10-10 DIAGNOSIS — N3010 Interstitial cystitis (chronic) without hematuria: Secondary | ICD-10-CM

## 2020-10-10 DIAGNOSIS — N952 Postmenopausal atrophic vaginitis: Secondary | ICD-10-CM

## 2020-10-10 MED ORDER — HYDROXYZINE HCL 25 MG PO TABS: 25.0000 mg | ORAL_TABLET | Freq: Every evening | ORAL | 5 refills | Status: DC

## 2020-10-10 NOTE — Progress Notes (Signed)
[facility] Return Visit  SUBJECTIVE  History of Present Illness: Gabriela Shepard is a 60 y.o. female seen in follow-up for IC and pelvic floor muscle spasm. Had a cystoscopy last visit which did not show any concerning findings. Had undergone a series of trigger point injections and bladder instillations. No longer taking amitriptyline, was having constipation. Now on lexapro. Not taking bladder supplements.  Has had hip surgery and her symptoms are night and day, feels much better.   Currently she is taking:  - Yuvafem - Atarax nightly - Cyclobenzaprine. Not using vaginal valium.   Has been doing PT at [facility] which has been going really well. Has not been using pelvic wand at home since her hip surgery.   Past Medical History: Patient  has a past medical history of Arthritis, Bowel habit changes, Depression, Flatulence, Headache, History of alcohol abuse, History of UTI, Migraines, PONV (postoperative nausea and vomiting), PTSD (post-traumatic stress disorder), STD (sexually transmitted disease), Substance abuse ([place]), and Urine incontinence.   Past Surgical History: She  has a past surgical history that includes Cesarean section (1992); laparoscopy; Total knee arthroplasty (Left, 12/23/2015); Colonoscopy; Breast biopsy (Left); Neck surgery (02/24/2018); Total hip arthroplasty; and Incontinence surgery.   Medications: She has a current medication list which includes the following prescription(s): aimovig, amitriptyline, bupivacaine, bupivacaine, vitamin d3, clotrimazole-betamethasone, diazepam, diazepam, escitalopram, esomeprazole, estradiol, heparin, lidocaine, uro-mp, methenamine-sodium salicylate, multivitamin with minerals, nitrofurantoin (macrocrystal-monohydrate), ondansetron, phenazopyridine, phenazopyridine, sodium bicarbonate, sodium bicarbonate, sumatriptan, hydroxyzine, and [DISCONTINUED] fluticasone, and the following Facility-Administered Medications:  heparin sodium (porcine) 1,000 Units in sodium chloride 0.9 % 500 mL irrigation.   Allergies: Patient is allergic to sulfa antibiotics.   Social History: Patient  reports that she quit smoking about 9 years ago. Her smoking use included cigarettes. She has a 5.00 pack-year smoking history. She has never used smokeless tobacco. She reports that she does not drink alcohol and does not use drugs.      OBJECTIVE     Physical Exam: Vitals:   10/10/20 1150  BP: 117/84  Pulse: 71   Gen: No apparent distress, A&O x 3.  Detailed Urogynecologic Evaluation:  Q tip test: No pain with palpation of external vulva or labia minora. Allodynia at introitus at 5 and 7 o'clock locations.  Digital exam: levator ani not on tension and no pain on palpation. Increased tenderness with palpation of obturator internus.    ASSESSMENT AND PLAN    Gabriela Shepard is a 60 y.o. with:  1. Interstitial cystitis   2. Other muscle spasm   3. Vaginal atrophy     1. IC - continue current regimen since pain has been well controlled overall.  - refill provided for atarax 25mg  nightly  2. Muscle spasm - Obturator muscles likely still has some residual tenderness due to recent hip surgery and mobility changes - Continue with pelvic floor PT at this time. If pain increases, could resume trigger point injections.  - Continue with cyclobenzaprine as needed  3. Vaginal atrophy - continue with yuvafem tablets.   Return 3 months  Gabriela Folds, MD  Time spent: I spent 30 minutes dedicated to the care of this patient on the date of this encounter to include pre-visit review of records, face-to-face time with the patient  and post visit documentation and ordering medication/ testing.

## 2020-10-13 DIAGNOSIS — Z9889 Other specified postprocedural states: Secondary | ICD-10-CM | POA: Diagnosis not present

## 2020-10-15 ENCOUNTER — Ambulatory Visit: Payer: BC Managed Care – PPO | Admitting: Obstetrics and Gynecology

## 2020-10-15 DIAGNOSIS — M25551 Pain in right hip: Secondary | ICD-10-CM | POA: Diagnosis not present

## 2020-10-15 DIAGNOSIS — R2681 Unsteadiness on feet: Secondary | ICD-10-CM | POA: Diagnosis not present

## 2020-10-15 DIAGNOSIS — R102 Pelvic and perineal pain: Secondary | ICD-10-CM | POA: Diagnosis not present

## 2020-10-16 ENCOUNTER — Other Ambulatory Visit: Payer: Self-pay

## 2020-10-16 ENCOUNTER — Ambulatory Visit (INDEPENDENT_AMBULATORY_CARE_PROVIDER_SITE_OTHER): Payer: BC Managed Care – PPO | Admitting: Obstetrics and Gynecology

## 2020-10-16 ENCOUNTER — Encounter: Payer: Self-pay | Admitting: Obstetrics and Gynecology

## 2020-10-16 VITALS — BP 130/72 | HR 77 | Ht 64.0 in | Wt 139.8 lb

## 2020-10-16 DIAGNOSIS — N766 Ulceration of vulva: Secondary | ICD-10-CM | POA: Diagnosis not present

## 2020-10-16 NOTE — Progress Notes (Signed)
GYNECOLOGY  VISIT   HPI: 60 y.o.   Divorced White or Caucasian Not Hispanic or Latino  female   [phone] with Patient's last menstrual period was 06/07/2010 (approximate).    Here for evaluation of a bump at the lower opening of her vagina and vulvar irritation . Right labia feels raw and burning.  Every once in a while she will get blisters on the vulva. Prior swab was negative for herpes. Current irritation started 3-4 days ago.  She had a right hip replacement 1 month ago.   She has had multiple treatments for IC. Doing better.   GYNECOLOGIC HISTORY: Patient's last menstrual period was 06/07/2010 (approximate). Contraception: none  Menopausal hormone therapy: estrace,         OB History    Gravida  2   Para  2   Term  2   Preterm      AB      Living  2     SAB      IAB      Ectopic      Multiple      Live Births  2              Patient Active Problem List   Diagnosis Date Noted  . Interstitial cystitis 07/23/2020  . Levator spasm 07/23/2020  . Age-related osteoporosis without current pathological fracture 08/06/2019  . Hyponatremia 08/06/2019  . Multiple thyroid nodules 08/06/2019  . 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 Piriformis syndrome, right 08/09/2018  . Weight gain 02/08/2018  . Trigger point of right shoulder region 12/22/2017  . Cervical radiculopathy at C6 11/29/2017  . Osteopenia 03/09/2016  . Primary osteoarthritis of left knee 12/23/2015  . IBS (irritable bowel syndrome) 02/19/2015  . Postmenopausal 12/19/2013  . Migraine headache 12/05/2012  . Pain, upper back 09/24/2010  . Depression, recurrent ([place]) 09/04/2010    Past Medical History:  Diagnosis Date  . Arthritis   . Bowel habit changes    been going on couple years  . Depression   . Flatulence    excessive with strong odor/uncontrollable  . Headache   . 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 History of UTI   . Migraines   . PONV (postoperative  nausea and vomiting)   . PTSD (post-traumatic stress disorder)   . STD (sexually transmitted disease)   . Substance abuse ([place])   . Urine incontinence     Past Surgical History:  Procedure Laterality Date  . BREAST BIOPSY Left    2017 benign x 2  . CESAREAN SECTION  1992  . COLONOSCOPY    . INCONTINENCE SURGERY    . LAPAROSCOPY     under bladder  . NECK SURGERY  02/24/2018   ACDF  . TOTAL HIP ARTHROPLASTY     left  . TOTAL KNEE ARTHROPLASTY Left 12/23/2015   Procedure: TOTAL KNEE ARTHROPLASTY;  Surgeon: Melrose Nakayama, MD;  Location: [facility];  Service: Orthopedics;  Laterality: Left;    Current Outpatient Medications  Medication Sig Dispense Refill  . AIMOVIG 70 MG/ML SOAJ INJECT 70 MG INTO THE SKIN EVERY 30 (THIRTY) DAYS. (Patient taking differently: Inject 70 mg as directed every 30 (thirty) days.) 6 pen 11  . Cholecalciferol (VITAMIN D3) 1.25 MG (50000 UT) CAPS TAKE 1 CAPSULE BY MOUTH ONE TIME PER WEEK 12 capsule 3  . clotrimazole-betamethasone (LOTRISONE) cream APPLY TO AFFECTED AREA TWICE A DAY 30 g 2  . diazepam (  VALIUM) 5 MG tablet Place 1 tablet vaginally nightly as needed for muscle spasm/ pelvic pain. 30 tablet 0  . diazepam (VALIUM) 5 MG tablet Place up to 2 tablets daily as needed for muscle spasm/ pelvic pain. 30 tablet 0  . escitalopram (LEXAPRO) 10 MG tablet TAKE 1 TABLET BY MOUTH EVERY DAY 90 tablet 2  . esomeprazole (NEXIUM) 40 MG capsule TAKE 1 CAPSULE BY MOUTH EVERY DAY 90 capsule 3  . Estradiol 10 MCG TABS vaginal tablet Place 1 tablet (10 mcg total) vaginally 2 (two) times a week. Place 1 tab nightly for two weeks then twice a week after 8 tablet 12  . hydrOXYzine (ATARAX/VISTARIL) 25 MG tablet Take 1 tablet (25 mg total) by mouth at bedtime. 30 tablet 5  . lidocaine (XYLOCAINE) 2 % jelly Place 1 application into the urethra as needed. 30 mL 0  . Meth-Hyo-M Bl-Na Phos-Ph Sal (URO-MP) 118 MG CAPS Take by mouth.    . Methenamine-Sodium Salicylate (CYSTEX PO) Take  by mouth.    . Multiple Vitamin (MULTIVITAMIN WITH MINERALS) TABS tablet Take 1 tablet by mouth daily.    . ondansetron (ZOFRAN ODT) 4 MG disintegrating tablet Take 1 tablet (4 mg total) by mouth every 8 (eight) hours as needed for nausea or vomiting. 20 tablet 0  . phenazopyridine (PYRIDIUM) 200 MG tablet Take 1 tablet (200 mg total) by mouth 3 (three) times daily as needed. 6 tablet 0  . phenazopyridine (PYRIDIUM) 95 MG tablet Take 1 tablet (95 mg total) by mouth 3 (three) times daily as needed for pain. 30 tablet 5  . SUMAtriptan (IMITREX) 100 MG tablet TAKE 1 TABLET BY MOUTH AS NEEDED FOR MIGRAINE HEADACHE, MAY REPEAT ONCE IN TWO HOURS AS NEEDED BUT NO MORE THAN 2 IN 24 HOURS. 9 tablet 4   Current Facility-Administered Medications  Medication Dose Route Frequency Provider Last Rate Last Admin  . heparin sodium (porcine) 1,000 Units in sodium chloride 0.9 % 500 mL irrigation   Irrigation Once Jaquita Folds, MD         ALLERGIES: Sulfa antibiotics  Family History  Problem Relation Age of Onset  . Hypertension Mother   . Lung cancer Mother   . Alcohol abuse Other   . Arthritis Other   . Hypertension Other   . Mental illness Other   . Kidney cancer Father   . Ulcers Father   . Drug abuse Sister   . Hypertension Maternal Grandmother   . Stomach cancer Maternal Uncle   . Colon cancer Neg Hx   . Esophageal cancer Neg Hx     Social History   Socioeconomic History  . Marital status: Divorced    Spouse name: Not on file  . Number of children: Not on file  . Years of education: Not on file  . Highest education level: Not on file  Occupational History  . Not on file  Tobacco Use  . Smoking status: Former Smoker    Packs/day: 0.50    Years: 10.00    Pack years: 5.00    Types: Cigarettes    Quit date: 11/13/2010    Years since quitting: 9.9  . Smokeless tobacco: Never Used  Vaping Use  . Vaping Use: Never used  Substance and Sexual Activity  . Alcohol use: No  . Drug  use: No  . Sexual activity: Not Currently    Birth control/protection: Post-menopausal  Other Topics Concern  . Not on file  Social History Narrative  . Not  on file   Social Determinants of Health   Financial Resource Strain: Not on file  Food Insecurity: Not on file  Transportation Needs: Not on file  Physical Activity: Not on file  Stress: Not on file  Social Connections: Not on file  Intimate Partner Violence: Not on file    Review of Systems  All other systems reviewed and are negative.   PHYSICAL EXAMINATION:    BP 130/72   Pulse 77   Ht 5\' 4"  (1.626 m)   Wt 139 lb 12.8 oz (63.4 kg)   LMP 06/07/2010 (Approximate)   SpO2 99%   BMI 24.00 kg/m     General appearance: alert, cooperative and appears stated age   Pelvic: External genitalia:  On the right labia majora is a 4-5 mm area or ulceration/excoriation. No blisters.               Urethra:  normal appearing urethra with no masses, tenderness or lesions              Bartholins and Skenes: normal                 Vagina opening: normal appearing hymen, bump in question appears to be part of her hymen, appears to be in the line of scar tissue with mild whitening, atrophic.                Chaperone was present for exam.   1. Vulvar ulcer vs excoriation, mild - HSV(herpes simplex vrs) 1+2 ab-IgG - SureSwab HSV, Type 1/2 DNA, PCR  2. Bump in question is part of her hymen, atrophic and in the line of scar tissue. Benign appearing.

## 2020-10-17 ENCOUNTER — Other Ambulatory Visit: Payer: Self-pay

## 2020-10-17 DIAGNOSIS — F4312 Post-traumatic stress disorder, chronic: Secondary | ICD-10-CM | POA: Diagnosis not present

## 2020-10-17 LAB — HSV(HERPES SIMPLEX VRS) I + II AB-IGG
HAV 1 IGG,TYPE SPECIFIC AB: <0.9 index
HSV 2 IGG,TYPE SPECIFIC AB: 9.56 index — ABNORMAL HIGH

## 2020-10-17 MED ORDER — VALACYCLOVIR HCL 500 MG PO TABS: ORAL_TABLET | ORAL | 2 refills | Status: DC

## 2020-10-21 DIAGNOSIS — M25551 Pain in right hip: Secondary | ICD-10-CM | POA: Diagnosis not present

## 2020-10-21 DIAGNOSIS — R102 Pelvic and perineal pain: Secondary | ICD-10-CM | POA: Diagnosis not present

## 2020-10-21 DIAGNOSIS — R2681 Unsteadiness on feet: Secondary | ICD-10-CM | POA: Diagnosis not present

## 2020-10-22 LAB — SURESWAB HSV, TYPE 1/2 DNA, PCR
HSV 1 DNA: NOT DETECTED
HSV 2 DNA: NOT DETECTED

## 2020-10-23 DIAGNOSIS — R102 Pelvic and perineal pain: Secondary | ICD-10-CM | POA: Diagnosis not present

## 2020-10-23 DIAGNOSIS — R2681 Unsteadiness on feet: Secondary | ICD-10-CM | POA: Diagnosis not present

## 2020-10-23 DIAGNOSIS — M25551 Pain in right hip: Secondary | ICD-10-CM | POA: Diagnosis not present

## 2020-10-23 DIAGNOSIS — N942 Vaginismus: Secondary | ICD-10-CM | POA: Diagnosis not present

## 2020-10-24 NOTE — Progress Notes (Signed)
Gabriela Shepard [place] Phone: [phone] Subjective:   [name], am serving as a scribe for Dr. Hulan Saas. This visit occurred during the SARS-CoV-2 public health emergency.  Safety protocols were in place, including screening questions prior to the visit, additional usage of staff PPE, and extensive cleaning of exam room while observing appropriate contact time as indicated for disinfecting solutions.   I'm seeing this patient by the request  of:  Eulas Post, MD  CC: Multiple joint complaints  EQA:[id number]  Gabriela Shepard is a 60 y.o. female coming in with complaint of hip and SI joint pain. Last seen in September of 2020. Patient states that 7 weeks ago she had R hip replacement. Continues to have lower back pain on right side.  Patient states that she has been fairly active.  Been doing physical therapy for it.  He has not made significant strides.  And sometimes can be very difficult.  Points more to the sacroiliac joint  Had neck surgery 2 years ago. Right side is again starting to bother her.  Patient denies any radicular symptoms but starts having some tingling sensation sometimes in the hands.  Patient states that it feels severely similar to what she has had previously.  Getting foot cramps at night and would like some suggestions.  Only intermittent.  He does not remember any true injury.  Does not think she snores.        Past Medical History:  Diagnosis Date  . Arthritis   . Bowel habit changes    been going on couple years  . Depression   . Flatulence    excessive with strong odor/uncontrollable  . Headache   . 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 History of UTI   . Migraines   . PONV (postoperative nausea and vomiting)   . PTSD (post-traumatic stress disorder)   . STD (sexually transmitted disease)   . Substance abuse ([place])   . Urine incontinence    Past  Surgical History:  Procedure Laterality Date  . BREAST BIOPSY Left    2017 benign x 2  . CESAREAN SECTION  1992  . COLONOSCOPY    . INCONTINENCE SURGERY    . LAPAROSCOPY     under bladder  . NECK SURGERY  02/24/2018   ACDF  . TOTAL HIP ARTHROPLASTY     left  . TOTAL KNEE ARTHROPLASTY Left 12/23/2015   Procedure: TOTAL KNEE ARTHROPLASTY;  Surgeon: Melrose Nakayama, MD;  Location: [facility];  Service: Orthopedics;  Laterality: Left;   Social History   Socioeconomic History  . Marital status: Divorced    Spouse name: Not on file  . Number of children: Not on file  . Years of education: Not on file  . Highest education level: Not on file  Occupational History  . Not on file  Tobacco Use  . Smoking status: Former Smoker    Packs/day: 0.50    Years: 10.00    Pack years: 5.00    Types: Cigarettes    Quit date: 11/13/2010    Years since quitting: 9.9  . Smokeless tobacco: Never Used  Vaping Use  . Vaping Use: Never used  Substance and Sexual Activity  . Alcohol use: No  . Drug use: No  . Sexual activity: Not Currently    Birth control/protection: Post-menopausal  Other Topics Concern  . Not on file  Social History Narrative  . Not on file   Social Determinants of Health   Financial Resource Strain: Not on file  Food Insecurity: Not on file  Transportation Needs: Not on file  Physical Activity: Not on file  Stress: Not on file  Social Connections: Not on file   Allergies  Allergen Reactions  . Sulfa Antibiotics Hives   Family History  Problem Relation Age of Onset  . Hypertension Mother   . Lung cancer Mother   . Alcohol abuse Other   . Arthritis Other   . Hypertension Other   . Mental illness Other   . Kidney cancer Father   . Ulcers Father   . Drug abuse Sister   . Hypertension Maternal Grandmother   . Stomach cancer Maternal Uncle   . Colon cancer Neg Hx   . Esophageal cancer Neg Hx           Current Outpatient Medications (Analgesics):  Marland Kitchen  AIMOVIG  70 MG/ML SOAJ, INJECT 70 MG INTO THE SKIN EVERY 30 (THIRTY) DAYS. (Patient taking differently: Inject 70 mg as directed every 30 (thirty) days.) .  meloxicam (MOBIC) 7.5 MG tablet, Take 1 tablet (7.5 mg total) by mouth daily. .  SUMAtriptan (IMITREX) 100 MG tablet, TAKE 1 TABLET BY MOUTH AS NEEDED FOR MIGRAINE HEADACHE, MAY REPEAT ONCE IN TWO HOURS AS NEEDED BUT NO MORE THAN 2 IN 24 HOURS.    Current Facility-Administered Medications (Hematological):  .  heparin sodium (porcine) 1,000 Units in sodium chloride 0.9 % 500 mL irrigation  Current Outpatient Medications (Other):  Marland Kitchen  Cholecalciferol (VITAMIN D3) 1.25 MG (50000 UT) CAPS, TAKE 1 CAPSULE BY MOUTH ONE TIME PER WEEK .  clotrimazole-betamethasone (LOTRISONE) cream, APPLY TO AFFECTED AREA TWICE A DAY .  diazepam (VALIUM) 5 MG tablet, Place 1 tablet vaginally nightly as needed for muscle spasm/ pelvic pain. .  diazepam (VALIUM) 5 MG tablet, Place up to 2 tablets daily as needed for muscle spasm/ pelvic pain. Marland Kitchen  escitalopram (LEXAPRO) 10 MG tablet, TAKE 1 TABLET BY MOUTH EVERY DAY .  esomeprazole (NEXIUM) 40 MG capsule, TAKE 1 CAPSULE BY MOUTH EVERY DAY .  Estradiol 10 MCG TABS vaginal tablet, Place 1 tablet (10 mcg total) vaginally 2 (two) times a week. Place 1 tab nightly for two weeks then twice a week after .  gabapentin (NEURONTIN) 100 MG capsule, Take 2 capsules (200 mg total) by mouth at bedtime. .  hydrOXYzine (ATARAX/VISTARIL) 25 MG tablet, Take 1 tablet (25 mg total) by mouth at bedtime. .  lidocaine (XYLOCAINE) 2 % jelly, Place 1 application into the urethra as needed. .  Meth-Hyo-M Bl-Na Phos-Ph Sal (URO-MP) 118 MG CAPS, Take by mouth. .  Methenamine-Sodium Salicylate (CYSTEX PO)*, Take by mouth. .  Multiple Vitamin (MULTIVITAMIN WITH MINERALS) TABS tablet, Take 1 tablet by mouth daily. .  ondansetron (ZOFRAN ODT) 4 MG disintegrating tablet, Take 1 tablet (4 mg total) by mouth every 8 (eight) hours as needed for nausea or  vomiting. .  phenazopyridine (PYRIDIUM) 200 MG tablet, Take 1 tablet (200 mg total) by mouth 3 (three) times daily as needed. .  phenazopyridine (PYRIDIUM) 95 MG tablet, Take 1 tablet (95 mg total) by mouth 3 (three) times daily as needed for pain. .  valACYclovir (VALTREX) 500 MG tablet, Take one tablet po bid x 3 days as needed.  * These medications belong to multiple therapeutic classes and are listed under each applicable group.   Reviewed prior external information including notes and  imaging from  primary care provider As well as notes that were available from care everywhere and other healthcare systems.  Past medical history, social, surgical and family history all reviewed in electronic medical record.  No pertanent information unless stated regarding to the chief complaint.   Review of Systems:  No headache, visual changes, nausea, vomiting, diarrhea, constipation, dizziness, abdominal pain, skin rash, fevers, chills, night sweats, weight loss, swollen lymph nodes,  chest pain, shortness of breath, mood changes. POSITIVE muscle aches, body aches, joint swelling  Objective  Blood pressure 122/82, pulse 77, height 5\' 4"  (1.626 m), weight 137 lb (62.1 kg), last menstrual period 06/07/2010, SpO2 97 %.   General: No apparent distress alert and oriented x3 mood and affect normal, dressed appropriately.  HEENT: Pupils equal, extraocular movements intact  Respiratory: Patient's speak in full sentences and does not appear short of breath  Cardiovascular: No lower extremity edema, non tender, no erythema  Gait normal with good balance and coordination.  MSK: Patient has had some atrophy of her lower extremities from previous exam.  It has been sometime since we have seen her. Relatively good core strength.  Good range of motion on the right hip replacement.  Tender to palpation in the sacroiliac joints right greater than left.  Tightness noted in the area. Neck exam loss of lordosis.  Very  mild radicular symptoms with Spurling's on the C6 right side.  Good grip strength no noted.    Impression and Recommendations:     The above documentation has been reviewed and is accurate and complete Lyndal Pulley, DO

## 2020-10-28 ENCOUNTER — Other Ambulatory Visit: Payer: Self-pay

## 2020-10-28 ENCOUNTER — Encounter: Payer: Self-pay | Admitting: Family Medicine

## 2020-10-28 ENCOUNTER — Ambulatory Visit (INDEPENDENT_AMBULATORY_CARE_PROVIDER_SITE_OTHER): Payer: BC Managed Care – PPO | Admitting: Family Medicine

## 2020-10-28 ENCOUNTER — Ambulatory Visit (INDEPENDENT_AMBULATORY_CARE_PROVIDER_SITE_OTHER): Payer: BC Managed Care – PPO

## 2020-10-28 VITALS — BP 122/82 | HR 77 | Ht 64.0 in | Wt 137.0 lb

## 2020-10-28 DIAGNOSIS — M5412 Radiculopathy, cervical region: Secondary | ICD-10-CM

## 2020-10-28 DIAGNOSIS — M533 Sacrococcygeal disorders, not elsewhere classified: Secondary | ICD-10-CM

## 2020-10-28 DIAGNOSIS — M5136 Other intervertebral disc degeneration, lumbar region: Secondary | ICD-10-CM | POA: Diagnosis not present

## 2020-10-28 DIAGNOSIS — M545 Low back pain, unspecified: Secondary | ICD-10-CM

## 2020-10-28 DIAGNOSIS — M2550 Pain in unspecified joint: Secondary | ICD-10-CM | POA: Diagnosis not present

## 2020-10-28 DIAGNOSIS — M810 Age-related osteoporosis without current pathological fracture: Secondary | ICD-10-CM

## 2020-10-28 DIAGNOSIS — G8929 Other chronic pain: Secondary | ICD-10-CM

## 2020-10-28 DIAGNOSIS — M542 Cervicalgia: Secondary | ICD-10-CM | POA: Diagnosis not present

## 2020-10-28 DIAGNOSIS — M25551 Pain in right hip: Secondary | ICD-10-CM

## 2020-10-28 DIAGNOSIS — Z981 Arthrodesis status: Secondary | ICD-10-CM | POA: Diagnosis not present

## 2020-10-28 DIAGNOSIS — M4322 Fusion of spine, cervical region: Secondary | ICD-10-CM | POA: Diagnosis not present

## 2020-10-28 LAB — COMPREHENSIVE METABOLIC PANEL
ALT: 15 U/L (ref 0–35)
AST: 16 U/L (ref 0–37)
Albumin: 4.2 g/dL (ref 3.5–5.2)
Alkaline Phosphatase: 55 U/L (ref 39–117)
BUN: 10 mg/dL (ref 6–23)
CO2: 24 mEq/L (ref 19–32)
Calcium: 9.1 mg/dL (ref 8.4–10.5)
Chloride: 97 mEq/L (ref 96–112)
Creatinine, Ser: 0.63 mg/dL (ref 0.40–1.20)
GFR: 96.79 mL/min (ref >60.00)
Glucose, Bld: 98 mg/dL (ref 70–99)
Potassium: 4.1 mEq/L (ref 3.5–5.1)
Sodium: 129 mEq/L — ABNORMAL LOW (ref 135–145)
Total Bilirubin: 0.3 mg/dL (ref 0.2–1.2)
Total Protein: 7.1 g/dL (ref 6.0–8.3)

## 2020-10-28 LAB — CBC WITH DIFFERENTIAL/PLATELET
Basophils Absolute: 0 10*3/uL (ref 0.0–0.1)
Basophils Relative: 0.9 % (ref 0.0–3.0)
Eosinophils Absolute: 0.1 10*3/uL (ref 0.0–0.7)
Eosinophils Relative: 2.1 % (ref 0.0–5.0)
HCT: 36.3 % (ref 36.0–46.0)
Hemoglobin: 12 g/dL (ref 12.0–15.0)
Lymphocytes Relative: 28.3 % (ref 12.0–46.0)
Lymphs Abs: 1.5 10*3/uL (ref 0.7–4.0)
MCHC: 33.1 g/dL (ref 30.0–36.0)
MCV: 87.2 fl (ref 78.0–100.0)
Monocytes Absolute: 0.6 10*3/uL (ref 0.1–1.0)
Monocytes Relative: 11.4 % (ref 3.0–12.0)
Neutro Abs: 3 10*3/uL (ref 1.4–7.7)
Neutrophils Relative %: 57.3 % (ref 43.0–77.0)
Platelets: 344 10*3/uL (ref 150.0–400.0)
RBC: 4.17 Mil/uL (ref 3.87–5.11)
RDW: 12.9 % (ref 11.5–15.5)
WBC: 5.2 10*3/uL (ref 4.0–10.5)

## 2020-10-28 LAB — IBC PANEL
Iron: 50 ug/dL (ref 42–145)
Saturation Ratios: 12.5 % — ABNORMAL LOW (ref 20.0–50.0)
Transferrin: 285 mg/dL (ref 212.0–360.0)

## 2020-10-28 LAB — SEDIMENTATION RATE: Sed Rate: 25 mm/hr (ref 0–30)

## 2020-10-28 LAB — PROTIME-INR
INR: 1 ratio (ref 0.8–1.0)
Prothrombin Time: 11.4 s (ref 9.6–13.1)

## 2020-10-28 LAB — TSH: TSH: 1.69 u[IU]/mL (ref 0.35–4.50)

## 2020-10-28 LAB — VITAMIN D 25 HYDROXY (VIT D DEFICIENCY, FRACTURES): VITD: 46.56 ng/mL (ref 30.00–100.00)

## 2020-10-28 LAB — URIC ACID: Uric Acid, Serum: 3 mg/dL (ref 2.4–7.0)

## 2020-10-28 LAB — C-REACTIVE PROTEIN: CRP: <1 mg/dL (ref 0.5–20.0)

## 2020-10-28 MED ORDER — GABAPENTIN 100 MG PO CAPS: 200.0000 mg | ORAL_CAPSULE | Freq: Every day | ORAL | 3 refills | Status: DC

## 2020-10-28 MED ORDER — MELOXICAM 7.5 MG PO TABS: 7.5000 mg | ORAL_TABLET | Freq: Every day | ORAL | 0 refills | Status: DC

## 2020-10-28 NOTE — Patient Instructions (Addendum)
Xrays today Labs today Meloxicam 7.5mg  take in 5 day bursts when needed Continue to stay active Have appt in 6 weeks

## 2020-10-28 NOTE — Assessment & Plan Note (Signed)
Encouraged her to consider the possibility of some of the different medications.  Patient will do more weightbearing exercises.  Discussed vitamin D.  Laboratory work-up ordered today for vitamin D.

## 2020-10-28 NOTE — Assessment & Plan Note (Signed)
Patient is having signs and symptoms consistent with more of a polyarthralgia.  Concern for this at this moment.  Patient has been through a lot in the last couple years ago.  Discussed with patient to start with potential laboratory work-up.  Patient does have the osteoporosis as well and encouraged her to follow-up with the endocrinology.  Discussed which activities to do which wants to avoid.  Patient will start to increase activity where she can.  Patient given a short course of a very low dose of meloxicam.  Patient will do it in 5 days burst.  Patient does have a past medical history significant for some mild sulfur antibiotics with hives but will monitor and has never had trouble with ibuprofen.  Follow-up with me again in 6 weeks otherwise.

## 2020-10-28 NOTE — Assessment & Plan Note (Signed)
History of neck surgery 2 years ago.  Starting to have very similar presentation.  We will get x-rays today to further evaluate.  Started on a low-dose of gabapentin which I think will be beneficial as well.  Warned of potential side effects.  Hopefully will help with some of the sleep difficulties she is having as well.  Follow-up again in 6 weeks

## 2020-10-28 NOTE — Assessment & Plan Note (Signed)
Discussed with patient symptoms well.  We will hold on any type of manipulation.  We will get x-rays to further evaluate at this time.  Patient recently did have the hip replacement and we will get an x-ray to make sure nothing else at this abnormal the patient is walking rather well.  Discussed home exercises and icing regimen.  We will get laboratory work-up to rule out anything else that could be contributing to some of her polyarthralgia.  Follow-up again in 6 to 8 weeks

## 2020-10-28 NOTE — Addendum Note (Signed)
Addended by: Boris Lown B on: 10/28/2020 02:22 PM   Modules accepted: Orders

## 2020-10-29 ENCOUNTER — Other Ambulatory Visit: Payer: Self-pay | Admitting: Obstetrics and Gynecology

## 2020-10-29 ENCOUNTER — Encounter: Payer: Self-pay | Admitting: Family Medicine

## 2020-10-29 ENCOUNTER — Encounter: Payer: Self-pay | Admitting: Internal Medicine

## 2020-10-30 ENCOUNTER — Ambulatory Visit (HOSPITAL_COMMUNITY)
Admission: EM | Admit: 2020-10-30 | Discharge: 2020-10-30 | Disposition: A | Discharge status: Home / Self Care | Payer: BC Managed Care – PPO | Attending: Emergency Medicine | Admitting: Emergency Medicine

## 2020-10-30 ENCOUNTER — Ambulatory Visit (INDEPENDENT_AMBULATORY_CARE_PROVIDER_SITE_OTHER): Payer: BC Managed Care – PPO | Admitting: Internal Medicine

## 2020-10-30 ENCOUNTER — Encounter: Payer: Self-pay | Admitting: Internal Medicine

## 2020-10-30 ENCOUNTER — Other Ambulatory Visit: Payer: Self-pay

## 2020-10-30 ENCOUNTER — Encounter: Payer: Self-pay | Admitting: Family Medicine

## 2020-10-30 ENCOUNTER — Encounter (HOSPITAL_COMMUNITY): Payer: Self-pay | Admitting: Emergency Medicine

## 2020-10-30 VITALS — BP 100/60 | HR 61 | Ht 64.0 in | Wt 136.0 lb

## 2020-10-30 DIAGNOSIS — R109 Unspecified abdominal pain: Secondary | ICD-10-CM | POA: Insufficient documentation

## 2020-10-30 DIAGNOSIS — E042 Nontoxic multinodular goiter: Secondary | ICD-10-CM | POA: Diagnosis not present

## 2020-10-30 DIAGNOSIS — Z20822 Contact with and (suspected) exposure to covid-19: Secondary | ICD-10-CM | POA: Insufficient documentation

## 2020-10-30 DIAGNOSIS — B349 Viral infection, unspecified: Secondary | ICD-10-CM

## 2020-10-30 DIAGNOSIS — R102 Pelvic and perineal pain: Secondary | ICD-10-CM | POA: Diagnosis not present

## 2020-10-30 DIAGNOSIS — R2681 Unsteadiness on feet: Secondary | ICD-10-CM | POA: Diagnosis not present

## 2020-10-30 DIAGNOSIS — M810 Age-related osteoporosis without current pathological fracture: Secondary | ICD-10-CM

## 2020-10-30 DIAGNOSIS — M25551 Pain in right hip: Secondary | ICD-10-CM | POA: Diagnosis not present

## 2020-10-30 DIAGNOSIS — E871 Hypo-osmolality and hyponatremia: Secondary | ICD-10-CM

## 2020-10-30 LAB — URINALYSIS
Bilirubin Urine: NEGATIVE
Hgb urine dipstick: NEGATIVE
Ketones, ur: NEGATIVE
Leukocytes,Ua: NEGATIVE
Nitrite: NEGATIVE
Specific Gravity, Urine: 1.01 (ref 1.000–1.030)
Total Protein, Urine: NEGATIVE
Urine Glucose: NEGATIVE
Urobilinogen, UA: 0.2 (ref 0.0–1.0)
pH: 7 (ref 5.0–8.0)

## 2020-10-30 LAB — LIPID PANEL
Cholesterol: 180 mg/dL (ref 0–200)
HDL: 75.4 mg/dL (ref >39.00)
LDL Cholesterol: 96 mg/dL (ref 0–99)
NonHDL: 104.24
Total CHOL/HDL Ratio: 2
Triglycerides: 39 mg/dL (ref 0.0–149.0)
VLDL: 7.8 mg/dL (ref 0.0–40.0)

## 2020-10-30 LAB — RHEUMATOID FACTOR: Rheumatoid fact SerPl-aCnc: <14 IU/mL (ref <14)

## 2020-10-30 LAB — BASIC METABOLIC PANEL
BUN: 8 mg/dL (ref 6–23)
CO2: 26 mEq/L (ref 19–32)
Calcium: 9.5 mg/dL (ref 8.4–10.5)
Chloride: 93 mEq/L — ABNORMAL LOW (ref 96–112)
Creatinine, Ser: 0.66 mg/dL (ref 0.40–1.20)
GFR: 95.71 mL/min (ref >60.00)
Glucose, Bld: 99 mg/dL (ref 70–99)
Potassium: 4 mEq/L (ref 3.5–5.1)
Sodium: 126 mEq/L — ABNORMAL LOW (ref 135–145)

## 2020-10-30 LAB — ANTI-NUCLEAR AB-TITER (ANA TITER): ANA Titer 1: 1:320 {titer} — ABNORMAL HIGH

## 2020-10-30 LAB — CYCLIC CITRUL PEPTIDE ANTIBODY, IGG: Cyclic Citrullin Peptide Ab: <16 UNITS

## 2020-10-30 LAB — ANA: Anti Nuclear Antibody (ANA): POSITIVE — AB

## 2020-10-30 LAB — PTH, INTACT AND CALCIUM
Calcium: 9.1 mg/dL (ref 8.6–10.4)
PTH: 37 pg/mL (ref 16–77)

## 2020-10-30 LAB — CALCIUM, IONIZED: Calcium, Ion: 4.78 mg/dL — ABNORMAL LOW (ref 4.8–5.6)

## 2020-10-30 LAB — ANGIOTENSIN CONVERTING ENZYME: Angiotensin-Converting Enzyme: 41 U/L (ref 9–67)

## 2020-10-30 MED ORDER — DICYCLOMINE HCL 20 MG PO TABS: 20.0000 mg | ORAL_TABLET | Freq: Two times a day (BID) | ORAL | 0 refills | Status: DC

## 2020-10-30 NOTE — Discharge Instructions (Addendum)
We will call you with any positive results from your COVID-19 testing completed in clinic today.  If you do not receive a phone call from [place] within the next 1 to 2 days, check your MyChart for up-to-date health information related to testing completed in clinic today.   Viral infections do not require antibiotics. For most people this is a self-limiting process and can take anywhere from 7 - 10 days to start feeling better. A cough can last up to 3 weeks. Pay special attention to handwashing as this can help prevent the spread of the virus.   Always read the labels of cough and cold medications as they may contain some of the ingredients below.  Rest, push lots of fluids (especially water), and utilize supportive care for symptoms. You may take acetaminophen (Tylenol) every 4-6 hours and ibuprofen every 6-8 hours for muscle pain, joint pain, headaches (you may also alternate these medications). Mucinex (guaifenesin) may be taken over the counter for cough as needed can loosen phlegm. Please read the instructions and take as directed.  Sudafed (pseudophedrine) is sold behind the counter and can help reduce nasal pressure; avoid taking this if you have high blood pressure or feel jittery. Sudafed PE (phenylephrine) can be a helpful, short-term, over-the-counter alternative to limit side effects or if you have high blood pressure.  Flonase nasal spray can help alleviate congestion and sinus pressure. Many patients choose Afrin as a nasal decongestant; do not use for more than 3 days for risk of rebound (increased symptoms after stopping medication).  Saline nasal sprays or rinses can also help nasal congestion (use bottled or sterile water). Warm tea with lemon and honey can sooth sore throat and cough, as can cough drops.   Return to clinic for high fever not improving with medications, chest pain, difficulty breathing, non-stop vomiting, or coughing blood. Follow-up with your primary care provider if  symptoms do not improve as expected in the next 5-7 days.  For your nausea, vomiting, upset stomach, and/or diarrhea:  Clear liquid diet (broth, popsicles, jello) for the next 12 - 24 hours then advance to a bland diet (BRAT), slowly advancing over the next two-to-three days as tolerated. BRAT diet as nausea subsides (Bananas, Rice, Apple sauce and Toast/crackers; this is a guide rather than an absolut diet plan). Push clear liquids: water, diluted Gatorade, Pedialyte, or WHO oral rehydration solution: 1 liter water, 8 tsp sugar, 1 tsp table salt,  cup orange juice or  mashed banana. Start with 1 teaspoon of liquid and increase to 2 ounces every 10 minutes, if tolerated, may increase to 8 ounces. Avoid drinks with high amounts of sugar, acidity or caffeine while ill. No meat, fried or fatty products, or ibuprofen for several days. Please be re-evaluated if your symptoms are not improving over the next 2 days.  Return to clinic or go to the emergency department for any uncontrolled nausea/vomiting, fever, worsening of abdominal pain, blood in the vomit or stool, new or worsening fever, signs of dehydration (decreased urination, dizziness, weakness).

## 2020-10-30 NOTE — Patient Instructions (Signed)
Please stop at the lab.  Please call and schedule bone density scan at the [facility]: [phone].  Please come back for a follow-up appointment in 1 year.

## 2020-10-30 NOTE — ED Triage Notes (Signed)
Pt reports generalized body aches and headache and green stool onset 3 days  Denies f/v/n/d, cough  Reports she's was Rx Levaquin for a oral infection from oral surgery  Also reports recent right side hip replacement on 09/11/20   A&O x4... NAD.Gabriela Shepard. ambulatory

## 2020-10-30 NOTE — Progress Notes (Addendum)
Patient ID: Gabriela Shepard, female   DOB: [birth year], 60 y.o.   MRN: [id number]   This visit occurred during the SARS-CoV-2 public health emergency.  Safety protocols were in place, including screening questions prior to the visit, additional usage of staff PPE, and extensive cleaning of exam room while observing appropriate contact time as indicated for disinfecting solutions.   HPI  Gabriela Shepard is a 60 y.o.-year-old female, initially referred by her OB/GYN doctor, Dr. Talbert Nan, returning for follow-up for osteoporosis (OP), thyroid nodules, hyponatremia.  Last visit 1 year and 3 months ago.  Interim hx: She was dx'ed with interstitial cystitis.  She has had a hard time with this.   She also got COVID-19 twice since last visit. She had a THR (R) due to significant hip OA. She has an oral infection >> on Levofloxacin.  She has mm cramps in feet. No falls or fractures since last OV.  Pt was dx with OP in 2019.  I reviewed pt's DXA scans: Date L1-L4 T score FN T score 33% distal Radius FRAX  [facility] ([facility]) L1-L2: -2.6 RFN: -1.4 LFN: n/a (L THR) n/a  10-year major osteoporosis fracture risk: 23%  02/18/2016 ([place]) L1-L4: -1.4 RFN: -1.5 LFN: -1.4 n/a    She has a history of a hand fracture - in her 20's.    No recent falls - last in 03/2019. At that time, she had hyponatremia and also dizziness/vertigo/orthostasis. No poor vision.   She had to have steroid inj's in neck, SI joint and ischium after her fractures.  She has a h/o L TKR.  OP treatments:  - Fosamax >> heartburn, stomach pain  + h/o vitamin D insufficiency. Reviewed available vit D levels: Lab Results  Component Value Date   VD25OH 46.56 10/28/2020   VD25OH 58.71 08/06/2019   VD25OH 27.0 (L) 06/05/2019   VD25OH 51.96 02/07/2018   VD25OH 40.54 03/18/2017   Pt is on: - calcium in MVI - vitamin D3 2000 units daily She is getting D3 im injection and B12 im.  By her integrative med dr.  + weight  bearing exercises and walk/hike.  She does not take high vitamin A doses.  Menopause was at 60-60 y/o. She was on HRT.  FH of osteoporosis: mother.  No h/o persistent hyper/hypocalcemia or hyperparathyroidism. No h/o kidney stones. Lab Results  Component Value Date   PTH 30 02/07/2018   CALCIUM 9.1 10/28/2020   CALCIUM 9.1 08/22/2020   CALCIUM 9.2 09/26/2019   CALCIUM 9.5 08/06/2019   CALCIUM 9.5 04/30/2019   CALCIUM 8.5 (L) 04/03/2019   CALCIUM 9.0 09/14/2018   CALCIUM 9.3 03/13/2018   CALCIUM 9.1 03/03/2018   CALCIUM 8.5 (L) 02/27/2018   No h/o thyrotoxicosis.  She had mild hypothyroidism and was on levothyroxine 25 mcg daily.   Reviewed TSH recent levels:  Lab Results  Component Value Date   TSH 1.69 10/28/2020   TSH 1.85 08/15/2019   TSH 1.48 04/30/2019   TSH 1.69 02/07/2018   TSH 1.55 03/07/2017   No h/o CKD. Last BUN/Cr: Lab Results  Component Value Date   BUN 10 10/28/2020   CREATININE 0.63 10/28/2020   She also has a history of thyroid nodules:  Thyroid ultrasound (03/27/2018): Dominant nodule is isoechoic, 2.4 x 1.5 x 1.8 cm.  She also had subcentimeter nodules.  Recommendation was made to follow-up with another ultrasound in a year.  Thyroid ultrasound (08/10/2019): Parenchymal Echotexture: Normal Isthmus: 0.3 cm Right lobe: 4.5 x 1.5 x  1.6 cm Left lobe: 4.7 x 1.9 x 2.7 cm _________________________________________________________  Nodule # 1: Prior biopsy: No Location: Left; Mid Maximum size: 2.9 cm; Other 2 dimensions: 2.1 x 1.8 cm, previously, 2.5 x 1.8 x 1.5 cm Composition: solid/almost completely solid (2) Echogenicity: isoechoic (1) Significant change in size (>/= 20% in two dimensions and minimal increase of 2 mm): Yes  **Given size (>/= 2.5 cm) and appearance, fine needle aspiration of this mildly suspicious nodule should be considered based on TI-RADS criteria. _________________________________________________________  Stable small  subcentimeter nodules in the right gland.  IMPRESSION: Enlarging TI-RADS category 3 (mildly suspicious) in the left mid gland. This nodule now meets criteria to consider fine-needle aspiration biopsy.   FNA of the nodule (08/15/2019): Specimen Submitted: A. THYROID, LEFT, FINE NEEDLE ASPIRATION:   FINAL MICROSCOPIC DIAGNOSIS:  - Consistent with benign follicular nodule (Bethesda category II)   SPECIMEN ADEQUACY:  Satisfactory for evaluation   Pt denies: - feeling nodules in neck - hoarseness - SOB with lying down However, she describes + dysphagia, + choking  She was sick last Fall >> was hospitalized.  She was found to have hyponatremia at that time.    She describes a 10-year history of increased stress due to a protracted divorce process.  She was seeing an integrative medicine provider and they checked her cortisol and this was low.  She was told that this may have been related to the increased stress from her divorce.    Reviewed sodium levels -chronically low: Lab Results  Component Value Date   NA 129 (L) 10/28/2020   NA 131 (L) 08/22/2020   NA 133 (L) 09/26/2019   NA 128 (L) 08/06/2019   NA 130 (L) 04/30/2019   NA 123 (L) 04/03/2019   NA 135 09/14/2018   NA 131 (L) 03/13/2018   NA 126 (L) 03/03/2018   NA 126 (L) 02/27/2018   We checked her for adrenal insufficiency at last visit and the investigation was negative: Component     Latest Ref Rng & Units 08/06/2019 08/10/2019 08/10/2019 08/10/2019          8:21 AM  8:56 AM  9:27 AM  Cortisol, Plasma     ug/dL 11.2 12.4 25.1 28.4  C206 ACTH     6 - 50 pg/mL  28     Uric acid and specific gravity were normal: Component     Latest Ref Rng & Units 08/22/2020 10/28/2020  Color, UA      dark yellow   Clarity, UA      clear   Glucose     Negative Negative   Bilirubin, UA      negative   Ketones, UA      negative   Specific Gravity, UA     1.010 - 1.025 1.010   RBC, UA      negative   pH, UA     5.0 - 8.0 7.0    Protein,UA     Negative Negative   Urobilinogen, UA     0.2 or 1.0 E.U./dL 1.0   Nitrite, UA      positive   Leukocytes,UA     Negative Trace (A)   Uric Acid, Serum     2.4 - 7.0 mg/dL  3.0   I reviewed the records from her integrative medicine provider (scanned): Hair analysis: -Normal with exception of slightly high Zinc  ROS: Constitutional: no weight gain, + weight loss, no fatigue, no subjective hyperthermia, no subjective hypothermia, no  nocturia Eyes: no blurry vision, no xerophthalmia ENT: no sore throat, + see HPI, no tinnitus, no hypoacusis Cardiovascular: no CP, no SOB, no palpitations, no leg swelling Respiratory: no cough, no SOB, no wheezing Gastrointestinal: no N, no V, no D, no C, no acid reflux Musculoskeletal: no muscle, + joint aches Skin: no rash, no hair loss Neurological: no tremors, no numbness or tingling/no dizziness/no HAs, + occasional dizziness Psychiatric: no depression, no anxiety  I reviewed pt's medications, allergies, PMH, social hx, family hx, and changes were documented in the history of present illness. Otherwise, unchanged from my initial visit note.  Past Medical History:  Diagnosis Date  . Arthritis   . Bowel habit changes    been going on couple years  . Depression   . Flatulence    excessive with strong odor/uncontrollable  . Headache   . 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 History of UTI   . Migraines   . PONV (postoperative nausea and vomiting)   . PTSD (post-traumatic stress disorder)   . STD (sexually transmitted disease)   . Substance abuse ([place])   . Urine incontinence    Past Surgical History:  Procedure Laterality Date  . BREAST BIOPSY Left    2017 benign x 2  . CESAREAN SECTION  1992  . COLONOSCOPY    . INCONTINENCE SURGERY    . LAPAROSCOPY     under bladder  . NECK SURGERY  02/24/2018   ACDF  . TOTAL HIP ARTHROPLASTY     left  . TOTAL KNEE ARTHROPLASTY Left 12/23/2015   Procedure:  TOTAL KNEE ARTHROPLASTY;  Surgeon: Melrose Nakayama, MD;  Location: [facility];  Service: Orthopedics;  Laterality: Left;   Social History   Socioeconomic History  . Marital status: Divorced    Spouse name: Not on file  . Number of children: Not on file  . Years of education: Not on file  . Highest education level: Not on file  Occupational History  . Not on file  Tobacco Use  . Smoking status: Former Smoker    Packs/day: 0.50    Years: 10.00    Pack years: 5.00    Types: Cigarettes    Quit date: 11/13/2010    Years since quitting: 9.9  . Smokeless tobacco: Never Used  Vaping Use  . Vaping Use: Never used  Substance and Sexual Activity  . Alcohol use: No  . Drug use: No  . Sexual activity: Not Currently    Birth control/protection: Post-menopausal  Other Topics Concern  . Not on file  Social History Narrative  . Not on file   Social Determinants of Health   Financial Resource Strain: Not on file  Food Insecurity: Not on file  Transportation Needs: Not on file  Physical Activity: Not on file  Stress: Not on file  Social Connections: Not on file  Intimate Partner Violence: Not on file   Current Outpatient Medications on File Prior to Visit  Medication Sig Dispense Refill  . AIMOVIG 70 MG/ML SOAJ INJECT 70 MG INTO THE SKIN EVERY 30 (THIRTY) DAYS. (Patient taking differently: Inject 70 mg as directed every 30 (thirty) days.) 6 pen 11  . Cholecalciferol (VITAMIN D3) 1.25 MG (50000 UT) CAPS TAKE 1 CAPSULE BY MOUTH ONE TIME PER WEEK 12 capsule 3  . clotrimazole-betamethasone (LOTRISONE) cream APPLY TO AFFECTED AREA TWICE A DAY 30 g 2  . diazepam (VALIUM) 5 MG tablet Place 1 tablet vaginally nightly  as needed for muscle spasm/ pelvic pain. 30 tablet 0  . diazepam (VALIUM) 5 MG tablet Place up to 2 tablets daily as needed for muscle spasm/ pelvic pain. 30 tablet 0  . escitalopram (LEXAPRO) 10 MG tablet TAKE 1 TABLET BY MOUTH EVERY DAY 90 tablet 2  . esomeprazole (NEXIUM) 40 MG  capsule TAKE 1 CAPSULE BY MOUTH EVERY DAY 90 capsule 3  . Estradiol 10 MCG TABS vaginal tablet Place 1 tablet (10 mcg total) vaginally 2 (two) times a week. Place 1 tab nightly for two weeks then twice a week after 8 tablet 12  . gabapentin (NEURONTIN) 100 MG capsule Take 2 capsules (200 mg total) by mouth at bedtime. 60 capsule 3  . hydrOXYzine (ATARAX/VISTARIL) 25 MG tablet Take 1 tablet (25 mg total) by mouth at bedtime. 30 tablet 5  . lidocaine (XYLOCAINE) 2 % jelly Place 1 application into the urethra as needed. 30 mL 0  . meloxicam (MOBIC) 7.5 MG tablet Take 1 tablet (7.5 mg total) by mouth daily. 30 tablet 0  . Meth-Hyo-M Bl-Na Phos-Ph Sal (URO-MP) 118 MG CAPS Take by mouth.    . Methenamine-Sodium Salicylate (CYSTEX PO) Take by mouth.    . Multiple Vitamin (MULTIVITAMIN WITH MINERALS) TABS tablet Take 1 tablet by mouth daily.    . ondansetron (ZOFRAN ODT) 4 MG disintegrating tablet Take 1 tablet (4 mg total) by mouth every 8 (eight) hours as needed for nausea or vomiting. 20 tablet 0  . phenazopyridine (PYRIDIUM) 200 MG tablet Take 1 tablet (200 mg total) by mouth 3 (three) times daily as needed. 6 tablet 0  . phenazopyridine (PYRIDIUM) 95 MG tablet Take 1 tablet (95 mg total) by mouth 3 (three) times daily as needed for pain. 30 tablet 5  . SUMAtriptan (IMITREX) 100 MG tablet TAKE 1 TABLET BY MOUTH AS NEEDED FOR MIGRAINE HEADACHE, MAY REPEAT ONCE IN TWO HOURS AS NEEDED BUT NO MORE THAN 2 IN 24 HOURS. 9 tablet 4  . valACYclovir (VALTREX) 500 MG tablet Take one tablet po bid x 3 days as needed. 30 tablet 2  . [DISCONTINUED] fluticasone (FLONASE) 50 MCG/ACT nasal spray Place 2 sprays daily into both nostrils. (Patient not taking: Reported on 09/26/2019) 16 g 11   Current Facility-Administered Medications on File Prior to Visit  Medication Dose Route Frequency Provider Last Rate Last Admin  . heparin sodium (porcine) 1,000 Units in sodium chloride 0.9 % 500 mL irrigation   Irrigation Once  Jaquita Folds, MD       Allergies  Allergen Reactions  . Sulfa Antibiotics Hives   Family History  Problem Relation Age of Onset  . Hypertension Mother   . Lung cancer Mother   . Alcohol abuse Other   . Arthritis Other   . Hypertension Other   . Mental illness Other   . Kidney cancer Father   . Ulcers Father   . Drug abuse Sister   . Hypertension Maternal Grandmother   . Stomach cancer Maternal Uncle   . Colon cancer Neg Hx   . Esophageal cancer Neg Hx     PE: BP 100/60 (BP Location: Right Arm, Patient Position: Sitting, Cuff Size: Normal)   Pulse 61   Ht 5\' 4"  (1.626 m)   Wt 136 lb (61.7 kg)   LMP 06/07/2010 (Approximate)   SpO2 99%   BMI 23.34 kg/m  Wt Readings from Last 3 Encounters:  10/30/20 136 lb (61.7 kg)  10/28/20 137 lb (62.1 kg)  10/16/20 139  lb 12.8 oz (63.4 kg)   Constitutional: Normal weight, in NAD. No kyphosis. Eyes: PERRLA, EOMI, no exophthalmos ENT: moist mucous membranes, + left thyroid fullness, no cervical lymphadenopathy Cardiovascular: RRR, No MRG Respiratory: CTA B Gastrointestinal: abdomen soft, NT, ND, BS+ Musculoskeletal: no deformities, strength intact in all 4 Skin: moist, warm, no rashes Neurological: no tremor with outstretched hands, DTR normal in all 4  Assessment: 1. Osteoporosis  2.  Thyroid nodules  Plan: 1. Osteoporosis -Likely postmenopausal/age-related +/- steroid injection related, she also has family history of osteoporosis -We reviewed her available DXA scan reports, latest from 05/03/2018 and based on the T-scores, she has an increased risk for fractures.  However, the 2 scans were done on 2 different machines.  On the last scan performed at the breast center, 2 of the vertebrae were eliminated from analysis, therefore, the 2 scans are not directly comparable.  Her hip bone mineral densities appear to be in the osteopenia range.  She is due for another bone density scan now.  We discussed about obtaining this on  the [place] lunar DXA machine and compare the results with her once in 2017 -Her vitamin D level was normal 2 days ago, at 46.56.  At last visit she was on 2000 units vitamin D daily - she continues this. -No falls or fractures since last visit -At last visit, we discussed about the different medication classes, benefits and side effects (including atypical fractures and ONJ - no dental workup in progress or planned).  The options are: Sq denosumab (Prolia) sq every 6 months for 6-10 years,  zoledronic acid (Reclast) iv 1x a year for 1-2 years. Teriparatide/Abaloparatide (which are daily subcu medication) or Romosozumab (monthly) are last resort medications for patients with severe osteoporosis, which she does not have.  She does not feel that she can give herself injections every day. -Will make a decision about treatment after the results of the new DXA scan are back. -I will see her back in a year  2.  Thyroid nodules -She has 1 dominant nodule, which did not appear to be worrisome but fairly large, at 2.4 cm in the largest dimension on the ultrasound from 03/27/2018, however, we checked another ultrasound after last visit on 08/10/2019 and this appeared to measure 2.9 cm.  We biopsied the nodule on 08/15/2019 and the results were benign. -However, at this visit, she describes neck compression symptoms (choking, dysphagia -worsened in the last 1.5 years) and she mentions that these are bothersome enough to desire left thyroid lobectomy.  I will refer her to surgery. -Most recent TSH was reviewed and this was normal, will not repeat this today  3.  Hyponatremia -Chronic -Associated with occasional dizziness, improved -Latest sodium was again low, at 129, decreased from 139 -At last visit, we checked a cortisol level which was normal, and we also checked a cosyntropin stimulation test which was excellent. -She does not have a history of hypothyroidism, latest TSH was 1.69 -Urine specific gravity was  normal at 1.010 and serum uric acid was also normal, at 3.0 -At today's visit, we discussed about possible etiologies of her hyponatremia.  She does not have CHF, liver disease, or renal disease to indicate hypervolemia.   At this visit, will check plasma osmolality -I do not suspect that this would be high, >300 for her, since this is usually in the setting of hypoglycemia or use of mannitol.  If it is normal, suspicion is for pseudohyponatremia from increased triglycerides or increased proteins.  If plasma osmolality is low, <280, suspicion is for SIADH vs. reset osmostat vs. medication effect if urine sodium is high and for use of diuretic or other fluid losses since she does not appear to have acute water overload. -Therefore, at today's visit, we will check a BMP, plasma osmolality, urine osmolality, urine sodium, SPEP (if abnormal, need UPEP), lipids.  Component     Latest Ref Rng & Units 10/30/2020  Specific Gravity, Urine     1.000 - 1.030 1.010  pH     5.0 - 8.0 7.0  Total Protein, Urine-UPE24     Negative NEGATIVE  Urine Glucose     Negative NEGATIVE  Ketones, ur     Negative NEGATIVE  Bilirubin Urine     Negative NEGATIVE  Hgb urine dipstick     Negative NEGATIVE  Urobilinogen, UA     0.0 - 1.0 0.2  Leukocytes,Ua     Negative NEGATIVE  Nitrite     Negative NEGATIVE  Sodium     135 - 145 mEq/L 126 (L)  Potassium     3.5 - 5.1 mEq/L 4.0  Chloride     96 - 112 mEq/L 93 (L)  CO2     19 - 32 mEq/L 26  Glucose     70 - 99 mg/dL 99  BUN     6 - 23 mg/dL 8  Creatinine     0.40 - 1.20 mg/dL 0.66  Total Protein     6.1 - 8.1 g/dL 7.2  Calcium     8.4 - 10.5 mg/dL 9.5  GFR     >60.00 mL/min 95.71  Albumin ELP     3.8 - 4.8 g/dL 4.2  Alpha 1     0.2 - 0.3 g/dL 0.4 (H)  Alpha 2     0.5 - 0.9 g/dL 0.7  Beta Globulin     0.4 - 0.6 g/dL 0.5  Beta 2     0.2 - 0.5 g/dL 0.3  Gamma Globulin     0.8 - 1.7 g/dL 1.0  SPE Interp.        Cholesterol     0 - 200 mg/dL 180   Triglycerides     0.0 - 149.0 mg/dL 39.0  HDL Cholesterol     >39.00 mg/dL 75.40  VLDL     0.0 - 40.0 mg/dL 7.8  LDL (calc)     0 - 99 mg/dL 96  Total CHOL/HDL Ratio      2  NonHDL      104.24  Osmolality, Urine     50 - 1,200 mOsm/kg 270  Sodium, Urine     28 - 272 mmol/L 48  Osmolality     278 - 305 mOsm/kg 263 (L)   Normal cholesterol levels, normal SPEP.  Urine specific gravity 1.010.  Plasma osmolality lower than 280, while urine osmolality is higher than 100 and sodium is higher than 40, which can be an indication of: SIADH (she does have a small meningioma per review of her brain MRI from 06/22/2016; CT chest did not show a tumor on 02/27/2018, but she is an ex-smoker).  I would suggest to repeat the brain MRI for now and also a screening CT chest. Will d/w her. Reset osmostat  Adrenal insufficiency (this was ruled out) Hypothyroidism (this was ruled out) Medications - SSRIs (she is on Lexapro, which very rarely causes SIADH)   11/09/2020 ([place]) Lumbar spine L1-L4 Femoral neck (FN) 33% distal radius  ultradistal radius  T-score -2.4 RFN: n/a LFN: n/a -1.7 -0.4  Change in BMD from previous DXA test (%) - c/w 02/18/2016 -12.6%* n/a n/a n/a 100  (*) statistically significant  T score at the spine worse compared to 2017.  Based on the above results, I will suggest to start Reclast or Prolia.  I will discuss with the patient.  Philemon Kingdom, MD PhD [facility] Endocr[facility]

## 2020-10-30 NOTE — ED Provider Notes (Signed)
CHIEF COMPLAINT:   Chief Complaint  Patient presents with  . URI    SUBJECTIVE/HPI:  HPI A very pleasant 60 y.o.Female presents today with abdominal cramping, generalized body aches, HA, neck pain, and green loose stools over the last 3 days. Patient states that she recently started Levaquin for a dental infection and her symptoms started shortly thereafter. She also reports having close contact with someone on Monday for whom has recently tested + for COVID-19. Patient reports intermittent severe stomach cramps to her lower abdomen.  She also states that her neck and head are "killing me" at times. She has also reported feeling feverish with mild chills, but no reported fever. Patient does not report any shortness of breath, chest pain, palpitations, visual changes, weakness, tingling, nausea, vomiting.   has a past medical history of Arthritis, Bowel habit changes, Depression, Flatulence, Headache, History of alcohol abuse, History of UTI, Migraines, PONV (postoperative nausea and vomiting), PTSD (post-traumatic stress disorder), STD (sexually transmitted disease), Substance abuse ([facility]), and Urine incontinence.  ROS:  Review of Systems See Subjective/HPI Medications, Allergies and Problem List personally reviewed in Epic today OBJECTIVE:   Today's Vitals   10/30/20 1910 10/30/20 1913  BP: (!) 122/94   Pulse: 61   Resp: 16   Temp: 98 F (36.7 C)   TempSrc: Oral   SpO2: 100%   PainSc:  8    Physical Exam   General: Appears well-developed and well-nourished. No acute distress.  HEENT Head: Normocephalic and atraumatic.  Mild frontal, maxillary sinus and tenderness noted to palpation. Ears: Hearing grossly intact, no drainage or visible deformity.  Nose: No nasal deviation.   Mouth/Throat: No stridor or tracheal deviation.  Non erythematous posterior pharynx noted with clear drainage present.  No white patchy exudate noted. Eyes: Conjunctivae and EOM are normal. No eye drainage or  scleral icterus bilaterally.  Neck: Normal range of motion, neck is supple. Shotty anterior cervical lymph nodes palpated.  Soft, mobile Cardiovascular: Normal rate. Regular rhythm; no murmurs, gallops, or rubs.  Pulm/Chest: No respiratory distress. Breath sounds normal bilaterally without wheezes, rhonchi, or rales.  Neurological: Alert and oriented to person, place, and time.  Skin: Skin is warm and dry.  No rashes, lesions, abrasions or bruising noted to skin.   Psychiatric: Normal mood, affect, behavior, and thought content.   Vital signs and nursing note reviewed.   Patient stable and cooperative with examination. PROCEDURES:    LABS/X-RAYS/EKG/MEDS:     MEDICAL DECISION MAKING:   Patient presents with abdominal cramping, generalized body aches, HA, neck pain, and green loose stools over the last 3 days. Patient states that she recently started Levaquin for a dental infection and her symptoms started shortly thereafter. She also reports having close contact with someone on Monday for whom has recently tested + for COVID-19. Patient reports intermittent severe stomach cramps to her lower abdomen.  She also states that her neck and head are "killing me" at times. She has also reported feeling feverish with mild chills, but no reported fever. Patient does not report any shortness of breath, chest pain, palpitations, visual changes, weakness, tingling, nausea, vomiting. Chart review completed. COVID-19 PCR obtained. Given exposure to COVID-19 potential for this virus, but explained that if COVID-19 is negative she still likely has a viral illness causing her current symptoms. Explained that her stomach cramping and discomfort may be secondary to Levaquin use given onset around the same time as starting this medication. RX'd bentyl to help her cramping and advised  at home treatment and care for vial illness as discussed. Also discussed starting oral antiviral therapy if COVID is +. Advised that  we would review this option if testing comes back as positive. Discussed quarantine guidelines and masking if COVID is +. Also discussed potential flu with her symptoms and given timeframe of onset and after discussing Tamiflu we came to a collective decision together that this wouldn't be helpful for her. She will discuss continuation of the Levaquin with her prescribed to ensure that she should continue taking this medication or whether she should discontinue and start a new antibiotic. Return to clinic as needed.  Patient verbalized understanding and agreed with treatment plan.  Patient stable upon discharge. ASSESSMENT/PLAN:  1. Viral illness  2. Stomach cramps  Instructions about new medications and side effects provided.  Plan:   Discharge Instructions     We will call you with any positive results from your COVID-19 testing completed in clinic today.  If you do not receive a phone call from [place] within the next 1 to 2 days, check your MyChart for up-to-date health information related to testing completed in clinic today.   Viral infections do not require antibiotics. For most people this is a self-limiting process and can take anywhere from 7 - 10 days to start feeling better. A cough can last up to 3 weeks. Pay special attention to handwashing as this can help prevent the spread of the virus.   Always read the labels of cough and cold medications as they may contain some of the ingredients below.   Rest, push lots of fluids (especially water), and utilize supportive care for symptoms.  You may take acetaminophen (Tylenol) every 4-6 hours and ibuprofen every 6-8 hours for muscle pain, joint pain, headaches (you may also alternate these medications).  Mucinex (guaifenesin) may be taken over the counter for cough as needed can loosen phlegm. Please read the instructions and take as directed.   Sudafed (pseudophedrine) is sold behind the counter and can help reduce nasal pressure; avoid  taking this if you have high blood pressure or feel jittery.  Sudafed PE (phenylephrine) can be a helpful, short-term, over-the-counter alternative to limit side effects or if you have high blood pressure.   Flonase nasal spray can help alleviate congestion and sinus pressure.  Many patients choose Afrin as a nasal decongestant; do not use for more than 3 days for risk of rebound (increased symptoms after stopping medication).   Saline nasal sprays or rinses can also help nasal congestion (use bottled or sterile water).  Warm tea with lemon and honey can sooth sore throat and cough, as can cough drops.   Return to clinic for high fever not improving with medications, chest pain, difficulty breathing, non-stop vomiting, or coughing blood. Follow-up with your primary care provider if symptoms do not improve as expected in the next 5-7 days.  For your nausea, vomiting, upset stomach, and/or diarrhea:   Clear liquid diet (broth, popsicles, jello) for the next 12 - 24 hours then advance to a bland diet (BRAT), slowly advancing over the next two-to-three days as tolerated.  BRAT diet as nausea subsides (Bananas, Rice, Apple sauce and Toast/crackers; this is a guide rather than an absolut diet plan).  Push clear liquids: water, diluted Gatorade, Pedialyte, or WHO oral rehydration solution: 1 liter water, 8 tsp sugar, 1 tsp table salt,  cup orange juice or  mashed banana. Start with 1 teaspoon of liquid and increase to 2 ounces every  10 minutes, if tolerated, may increase to 8 ounces.  Avoid drinks with high amounts of sugar, acidity or caffeine while ill.  No meat, fried or fatty products, or ibuprofen for several days.  Please be re-evaluated if your symptoms are not improving over the next 2 days.  Return to clinic or go to the emergency department for any uncontrolled nausea/vomiting, fever, worsening of abdominal pain, blood in the vomit or stool, new or worsening fever, signs of  dehydration (decreased urination, dizziness, weakness).        A copy of these instructions have been given to the patient or responsible adult who demonstrated the ability to learn, asked appropriate questions, and verbalized understanding of the plan of care.  There were no barriers to learning identified.    Gabriela Royals, FNP-C 10/30/20  This note was partially made with the aid of speech-to-text dictation; typographical errors are not intentional.   Gabriela Shepard, [place] 10/30/20 2000

## 2020-10-31 ENCOUNTER — Other Ambulatory Visit: Payer: Self-pay

## 2020-10-31 ENCOUNTER — Encounter: Payer: Self-pay | Admitting: Internal Medicine

## 2020-10-31 DIAGNOSIS — R768 Other specified abnormal immunological findings in serum: Secondary | ICD-10-CM

## 2020-10-31 LAB — SARS CORONAVIRUS 2 (TAT 6-24 HRS): SARS Coronavirus 2: NEGATIVE

## 2020-11-03 ENCOUNTER — Other Ambulatory Visit: Payer: Self-pay | Admitting: Family Medicine

## 2020-11-04 ENCOUNTER — Encounter: Payer: Self-pay | Admitting: Internal Medicine

## 2020-11-04 ENCOUNTER — Other Ambulatory Visit: Payer: Self-pay | Admitting: Family Medicine

## 2020-11-04 ENCOUNTER — Ambulatory Visit (AMBULATORY_SURGERY_CENTER): Payer: Self-pay

## 2020-11-04 ENCOUNTER — Other Ambulatory Visit: Payer: Self-pay

## 2020-11-04 VITALS — Ht 64.0 in | Wt 138.0 lb

## 2020-11-04 DIAGNOSIS — Z86010 Personal history of colonic polyps: Secondary | ICD-10-CM

## 2020-11-04 LAB — PROTEIN ELECTROPHORESIS, SERUM
Albumin ELP: 4.2 g/dL (ref 3.8–4.8)
Alpha 1: 0.4 g/dL — ABNORMAL HIGH (ref 0.2–0.3)
Alpha 2: 0.7 g/dL (ref 0.5–0.9)
Beta 2: 0.3 g/dL (ref 0.2–0.5)
Beta Globulin: 0.5 g/dL (ref 0.4–0.6)
Gamma Globulin: 1 g/dL (ref 0.8–1.7)
Total Protein: 7.2 g/dL (ref 6.1–8.1)

## 2020-11-04 LAB — SODIUM, URINE, RANDOM: Sodium, Ur: 48 mmol/L (ref 28–272)

## 2020-11-04 LAB — OSMOLALITY, URINE: Osmolality, Ur: 270 mOsm/kg (ref 50–1200)

## 2020-11-04 LAB — OSMOLALITY: Osmolality: 263 mOsm/kg — ABNORMAL LOW (ref 278–305)

## 2020-11-04 MED ORDER — PEG-KCL-NACL-NASULF-NA ASC-C 100 G PO SOLR: 1.0000 | Freq: Once | ORAL | 0 refills | Status: AC

## 2020-11-04 NOTE — Progress Notes (Signed)
No egg or soy allergy known to patient  No issues with past sedation with any surgeries or procedures Patient denies ever being told they had issues or difficulty with intubation  No FH of Malignant Hyperthermia No diet pills per patient No home 02 use per patient  No blood thinners per patient  Pt reports issues with constipation -- patient reports she uses Miralax and a stool softener as needed for constipation No A fib or A flutter  EMMI video via MyChart  COVID 19 guidelines implemented in PV today with Pt and RN  NO PA's for preps discussed with pt in PV today  Discussed with pt there will be an out-of-pocket cost for prep and that varies from $0 to 70 dollars   Due to the COVID-19 pandemic we are asking patients to follow certain guidelines.  Pt aware of COVID protocols and [facility] guidelines   Patient reports she recently had dental surgery where she had a dental implant placed and would like the CRNA to be aware of this

## 2020-11-05 ENCOUNTER — Other Ambulatory Visit: Payer: Self-pay | Admitting: Internal Medicine

## 2020-11-05 ENCOUNTER — Ambulatory Visit (INDEPENDENT_AMBULATORY_CARE_PROVIDER_SITE_OTHER)
Admission: RE | Admit: 2020-11-05 | Discharge: 2020-11-05 | Disposition: A | Discharge status: Home / Self Care | Payer: BC Managed Care – PPO | Source: Ambulatory Visit | Attending: Internal Medicine | Admitting: Internal Medicine

## 2020-11-05 ENCOUNTER — Other Ambulatory Visit: Payer: Self-pay | Admitting: Family Medicine

## 2020-11-05 ENCOUNTER — Encounter: Payer: Self-pay | Admitting: Family Medicine

## 2020-11-05 ENCOUNTER — Ambulatory Visit (INDEPENDENT_AMBULATORY_CARE_PROVIDER_SITE_OTHER): Payer: BC Managed Care – PPO | Admitting: Family Medicine

## 2020-11-05 VITALS — BP 120/62 | HR 82 | Temp 98.1°F | Ht 64.0 in | Wt 136.6 lb

## 2020-11-05 DIAGNOSIS — Z780 Asymptomatic menopausal state: Secondary | ICD-10-CM

## 2020-11-05 DIAGNOSIS — L570 Actinic keratosis: Secondary | ICD-10-CM | POA: Diagnosis not present

## 2020-11-05 DIAGNOSIS — H6981 Other specified disorders of Eustachian tube, right ear: Secondary | ICD-10-CM

## 2020-11-05 DIAGNOSIS — E222 Syndrome of inappropriate secretion of antidiuretic hormone: Secondary | ICD-10-CM

## 2020-11-05 DIAGNOSIS — M810 Age-related osteoporosis without current pathological fracture: Secondary | ICD-10-CM | POA: Diagnosis not present

## 2020-11-05 DIAGNOSIS — R2681 Unsteadiness on feet: Secondary | ICD-10-CM | POA: Diagnosis not present

## 2020-11-05 DIAGNOSIS — M25551 Pain in right hip: Secondary | ICD-10-CM | POA: Diagnosis not present

## 2020-11-05 DIAGNOSIS — Z1231 Encounter for screening mammogram for malignant neoplasm of breast: Secondary | ICD-10-CM

## 2020-11-05 DIAGNOSIS — E041 Nontoxic single thyroid nodule: Secondary | ICD-10-CM | POA: Diagnosis not present

## 2020-11-05 DIAGNOSIS — R102 Pelvic and perineal pain: Secondary | ICD-10-CM | POA: Diagnosis not present

## 2020-11-05 DIAGNOSIS — R768 Other specified abnormal immunological findings in serum: Secondary | ICD-10-CM | POA: Diagnosis not present

## 2020-11-05 DIAGNOSIS — E871 Hypo-osmolality and hyponatremia: Secondary | ICD-10-CM

## 2020-11-05 NOTE — Progress Notes (Signed)
Established Patient Office Visit  Subjective:  Patient ID: Gabriela Shepard, female    DOB: 28-Apr-1961  Age: 60 y.o. MRN: [id number]  CC:  Chief Complaint  Patient presents with  . Annual Exam    Discuss recent labs    HPI Gabriela Shepard presents initially scheduled for physical exam.  However, she sees gynecologist for wellness visits yearly and has several other items she would like to discuss instead.  She had recent total hip replacement and that went well.  Overall she feels very poorly and has multiple issues she would like to discuss further.  She would like to change over to another gynecologist and is specifically requesting referral for that.  She has significant fatigue issues.    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  Patient was told to restrict fluids to 1 L/day but she is struggling with the idea of being that restrictive right now.  She had sent a message recently that she had had oral surgery and was placed on Levaquin.  She has significant myalgias which have improved somewhat since she went off the Levaquin.  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  She has been referred to rheumatology.  She has history of low bone density.  Has follow-up DEXA scan pending.  She had multiple questions regarding various osteoporosis therapies.  Right ear fullness intermittently for several days.  No recent flying.  No major nasal congestion.  No ear drainage.  No hearing loss.  Scaly spot right side of face.  She is basically requesting referral back to dermatology to have full skin check.  No prior history of cancer.  History of thyroid nodule previously.  She had biopsy a year ago.  She is concerned about some persistent anterior neck symptoms- fullness and occ pain..  Past Medical History:   Diagnosis Date  . Allergy    seasonal allergies  . Arthritis    generalized  . Bowel habit changes    been going on couple years  . Depression    on meds  . Flatulence    excessive with strong odor/uncontrollable  . GERD (gastroesophageal reflux disease)    PRN meds  . Headache   . 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 History of UTI   . Migraines   . PONV (postoperative nausea and vomiting)   . PTSD (post-traumatic stress disorder)    on meds  . STD (sexually transmitted disease)   . Substance abuse ([facility])    hx of alcohol  . Urine incontinence     Past Surgical History:  Procedure Laterality Date  . BREAST BIOPSY Left    2017 benign x 2  . CESAREAN SECTION  1992  . COLONOSCOPY  2018   JMP-MAC-suprep(good)-polyps  . INCONTINENCE SURGERY    . LAPAROSCOPY     under bladder  . NECK SURGERY  02/24/2018   ACDF  . POLYPECTOMY  2018   polyps removed  . TOTAL HIP ARTHROPLASTY Right 09/08/2020  . TOTAL HIP ARTHROPLASTY Left 2018  . TOTAL KNEE ARTHROPLASTY Left 12/23/2015   Procedure: TOTAL KNEE ARTHROPLASTY;  Surgeon: Melrose Nakayama, MD;  Location: [facility];  Service: Orthopedics;  Laterality: Left;    Family History  Problem Relation Age of Onset  . Hypertension Mother   .  Lung cancer Mother   . Alcohol abuse Other   . Arthritis Other   . Hypertension Other   . Mental illness Other   . Kidney cancer Father   . Ulcers Father   . Drug abuse Sister   . Hypertension Maternal Grandmother   . Colon cancer Neg Hx   . Esophageal cancer Neg Hx   . Colon polyps Neg Hx   . Rectal cancer Neg Hx   . Stomach cancer Neg Hx     Social History   Socioeconomic History  . Marital status: Divorced    Spouse name: Not on file  . Number of children: Not on file  . Years of education: Not on file  . Highest education level: Not on file  Occupational History  . Not on file  Tobacco Use  . Smoking status: Former Smoker    Packs/day: 0.50    Years:  10.00    Pack years: 5.00    Types: Cigarettes    Quit date: 11/13/2010    Years since quitting: 9.9  . Smokeless tobacco: Never Used  Vaping Use  . Vaping Use: Never used  Substance and Sexual Activity  . Alcohol use: No    Comment: hx of ETOH abuse-sober since 11/2010   . Drug use: No  . Sexual activity: Not Currently    Birth control/protection: Post-menopausal  Other Topics Concern  . Not on file  Social History Narrative  . Not on file   Social Determinants of Health   Financial Resource Strain: Not on file  Food Insecurity: Not on file  Transportation Needs: Not on file  Physical Activity: Not on file  Stress: Not on file  Social Connections: Not on file  Intimate Partner Violence: Not on file    Outpatient Medications Prior to Visit  Medication Sig Dispense Refill  . AIMOVIG 70 MG/ML SOAJ INJECT 70 MG INTO THE SKIN EVERY 30 (THIRTY) DAYS. 3 mL 0  . Cholecalciferol (VITAMIN D3) 1.25 MG (50000 UT) CAPS TAKE 1 CAPSULE BY MOUTH ONE TIME PER WEEK 12 capsule 3  . clonazePAM (KLONOPIN) 1 MG tablet Take 1 mg by mouth 2 (two) times daily.    . clotrimazole-betamethasone (LOTRISONE) cream APPLY TO AFFECTED AREA TWICE A DAY (Patient taking differently: 2 (two) times daily as needed.) 30 g 2  . cyclobenzaprine (FLEXERIL) 10 MG tablet Take 1 tablet by mouth every 8 (eight) hours as needed.    Marland Kitchen escitalopram (LEXAPRO) 10 MG tablet TAKE 1 TABLET BY MOUTH EVERY DAY 90 tablet 2  . esomeprazole (NEXIUM) 40 MG capsule TAKE 1 CAPSULE BY MOUTH EVERY DAY (Patient taking differently: daily as needed.) 90 capsule 3  . Estradiol 10 MCG TABS vaginal tablet Place 1 tablet (10 mcg total) vaginally 2 (two) times a week. Place 1 tab nightly for two weeks then twice a week after 8 tablet 12  . gabapentin (NEURONTIN) 100 MG capsule Take 2 capsules (200 mg total) by mouth at bedtime. 60 capsule 3  . hydrOXYzine (ATARAX/VISTARIL) 25 MG tablet Take 1 tablet (25 mg total) by mouth at bedtime. 30 tablet 5   . levofloxacin (LEVAQUIN) 500 MG tablet Take 500 mg by mouth daily.    . meloxicam (MOBIC) 7.5 MG tablet Take 1 tablet (7.5 mg total) by mouth daily. 30 tablet 0  . Multiple Vitamin (MULTIVITAMIN WITH MINERALS) TABS tablet Take 1 tablet by mouth daily.    . ondansetron (ZOFRAN ODT) 4 MG disintegrating tablet Take 1 tablet (4 mg  total) by mouth every 8 (eight) hours as needed for nausea or vomiting. 20 tablet 0  . phenazopyridine (PYRIDIUM) 200 MG tablet Take 1 tablet (200 mg total) by mouth 3 (three) times daily as needed. 6 tablet 0  . phenazopyridine (PYRIDIUM) 95 MG tablet Take 1 tablet (95 mg total) by mouth 3 (three) times daily as needed for pain. 30 tablet 5  . SUMAtriptan (IMITREX) 100 MG tablet TAKE 1 TABLET BY MOUTH AS NEEDED FOR MIGRAINE HEADACHE, MAY REPEAT ONCE IN TWO HOURS AS NEEDED BUT NO MORE THAN 2 IN 24 HOURS. 9 tablet 4  . valACYclovir (VALTREX) 500 MG tablet Take one tablet po bid x 3 days as needed. 30 tablet 2   No facility-administered medications prior to visit.    Allergies  Allergen Reactions  . Sulfa Antibiotics Hives    ROS Review of Systems  Constitutional: Positive for fatigue. Negative for appetite change, chills and fever.  HENT: Negative for ear discharge, hearing loss, sinus pressure and tinnitus.   Respiratory: Negative for shortness of breath.   Cardiovascular: Negative for chest pain.  Gastrointestinal: Negative for abdominal pain.  Genitourinary: Negative for dysuria.  Musculoskeletal: Positive for arthralgias.  Hematological: Negative for adenopathy.      Objective:    Physical Exam Vitals reviewed.  Constitutional:      Appearance: Normal appearance.  HENT:     Right Ear: Tympanic membrane, ear canal and external ear normal.     Left Ear: Tympanic membrane, ear canal and external ear normal.     Mouth/Throat:     Mouth: Mucous membranes are moist.     Pharynx: Oropharynx is clear.  Cardiovascular:     Rate and Rhythm: Normal rate  and regular rhythm.  Pulmonary:     Effort: Pulmonary effort is normal.     Breath sounds: Normal breath sounds.  Abdominal:     Palpations: Abdomen is soft.     Tenderness: There is no abdominal tenderness.  Musculoskeletal:     Cervical back: Neck supple.  Skin:    Comments: Slightly rash scaly rash right temporal region.   Neurological:     General: No focal deficit present.     Mental Status: She is alert.     BP 120/62 (BP Location: Left Arm, Patient Position: Sitting, Cuff Size: Normal)   Pulse 82   Temp 98.1 F (36.7 C) (Oral)   Ht _0  (1.626 m)   Wt 136 lb 9.6 oz (62 kg)   LMP 06/07/2010 (Approximate)   SpO2 95%   BMI 23.45 kg/m  Wt Readings from Last 3 Encounters:  11/05/20 136 lb 9.6 oz (62 kg)  11/04/20 138 lb (62.6 kg)  10/30/20 136 lb (61.7 kg)     Health Maintenance Due  Topic Date Due  . COVID-19 Vaccine (1) Never done  . HIV Screening  Never done  . Zoster Vaccines- Shingrix (1 of 2) Never done  . COLONOSCOPY (Pts 45-51yr Insurance coverage will need to be confirmed)  05/03/2020    There are no preventive care reminders to display for this patient.  Lab Results  Component Value Date   TSH 1.69 10/28/2020   Lab Results  Component Value Date   WBC 5.2 10/28/2020   HGB 12.0 10/28/2020   HCT 36.3 10/28/2020   MCV 87.2 10/28/2020   PLT 344.0 10/28/2020   Lab Results  Component Value Date   NA 126 (L) 10/30/2020   K 4.0 10/30/2020   CO2 26 10/30/2020  GLUCOSE 99 10/30/2020   BUN 8 10/30/2020   CREATININE 0.66 10/30/2020   BILITOT 0.3 10/28/2020   ALKPHOS 55 10/28/2020   AST 16 10/28/2020   ALT 15 10/28/2020   PROT 7.2 10/30/2020   ALBUMIN 4.2 10/28/2020   CALCIUM 9.5 10/30/2020   ANIONGAP 12 09/26/2019   GFR 95.71 10/30/2020   Lab Results  Component Value Date   CHOL 180 10/30/2020   Lab Results  Component Value Date   HDL 75.40 10/30/2020   Lab Results  Component Value Date   LDLCALC 96 10/30/2020   Lab Results   Component Value Date   TRIG 39.0 10/30/2020   Lab Results  Component Value Date   CHOLHDL 2 10/30/2020   Lab Results  Component Value Date   HGBA1C 5.9 05/02/2017      Assessment & Plan:   #1 recent myalgias possibly related to Levaquin therapy.  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  Followed by endocrinology.  #3 right ear fullness.  Normal exam.  Suspect eustachian tube dysfunction. -Try Valsalva maneuver against nasal pressure for any recurrent episodes to see if this helps  #4 increased stress issues.  She has substantial work stress and is hoping to look at other future work options down the road  #5 history of left thyroid nodule.  Patient concerned about some ongoing neck symptoms.  She had biopsy a year ago which did not show any malignant change.  Consider repeat neck ultrasound  #6 small slightly hyperkeratotic lesion right temporal region.  Question small AK -Set up dermatology referral per patient request  #7 health maintenance-discussed Shingrix vaccine and she will consider  Over 40 minutes spent reviewing/assessing multiple issues above, reviewing multiple recent labs, and making referrals     No orders of the defined types were placed in this encounter.   Follow-up: No follow-ups on file.    Carolann Littler, MD

## 2020-11-05 NOTE — Patient Instructions (Signed)
Consider Shingrix vaccine and check on insurance coverage

## 2020-11-06 DIAGNOSIS — F4312 Post-traumatic stress disorder, chronic: Secondary | ICD-10-CM | POA: Diagnosis not present

## 2020-11-10 ENCOUNTER — Encounter: Payer: Self-pay | Admitting: Internal Medicine

## 2020-11-12 ENCOUNTER — Other Ambulatory Visit: Payer: BC Managed Care – PPO

## 2020-11-12 DIAGNOSIS — M25551 Pain in right hip: Secondary | ICD-10-CM | POA: Diagnosis not present

## 2020-11-12 DIAGNOSIS — R2681 Unsteadiness on feet: Secondary | ICD-10-CM | POA: Diagnosis not present

## 2020-11-12 DIAGNOSIS — N3010 Interstitial cystitis (chronic) without hematuria: Secondary | ICD-10-CM | POA: Diagnosis not present

## 2020-11-13 ENCOUNTER — Other Ambulatory Visit: Payer: Self-pay | Admitting: Family Medicine

## 2020-11-14 ENCOUNTER — Ambulatory Visit: Payer: BC Managed Care – PPO

## 2020-11-17 ENCOUNTER — Ambulatory Visit: Payer: BC Managed Care – PPO | Admitting: Family Medicine

## 2020-11-17 ENCOUNTER — Other Ambulatory Visit: Payer: BC Managed Care – PPO

## 2020-11-18 ENCOUNTER — Other Ambulatory Visit: Payer: Self-pay

## 2020-11-18 ENCOUNTER — Encounter: Payer: Self-pay | Admitting: Internal Medicine

## 2020-11-18 ENCOUNTER — Ambulatory Visit (AMBULATORY_SURGERY_CENTER): Payer: BC Managed Care – PPO | Admitting: Internal Medicine

## 2020-11-18 VITALS — BP 112/71 | HR 71 | Temp 97.2°F | Resp 16 | Ht 64.0 in | Wt 138.0 lb

## 2020-11-18 DIAGNOSIS — D124 Benign neoplasm of descending colon: Secondary | ICD-10-CM

## 2020-11-18 DIAGNOSIS — Z1211 Encounter for screening for malignant neoplasm of colon: Secondary | ICD-10-CM | POA: Diagnosis not present

## 2020-11-18 DIAGNOSIS — K635 Polyp of colon: Secondary | ICD-10-CM

## 2020-11-18 DIAGNOSIS — Z86010 Personal history of colonic polyps: Secondary | ICD-10-CM

## 2020-11-18 MED ORDER — SODIUM CHLORIDE 0.9 % IV SOLN: 500.0000 mL | Freq: Once | INTRAVENOUS | Status: DC

## 2020-11-18 NOTE — Progress Notes (Signed)
VS by CW  I have reviewed the patient's medical history in detail and updated the computerized patient record.  

## 2020-11-18 NOTE — Progress Notes (Signed)
pt tolerated well. VSS. awake and to recovery. Report given to RN.  

## 2020-11-18 NOTE — Op Note (Signed)
[place] Patient Name: Gabriela Shepard Procedure Date: 11/18/2020 11:00 AM MRN: [id number] Endoscopist: Jerene Bears , MD Age: 60 Referring MD:  Date of Birth: [birth year] Gender: Female Account #: [id number] Procedure:                Colonoscopy Indications:              High risk colon cancer surveillance: Personal                            history of non-advanced adenomas and sessile                            serrated polyps, Last colonoscopy: November 2018 (2                            adenomas and 1 SSP) Medicines:                Monitored Anesthesia Care Procedure:                Pre-Anesthesia Assessment:                           - Prior to the procedure, a History and Physical                            was performed, and patient medications and                            allergies were reviewed. The patient's tolerance of                            previous anesthesia was also reviewed. The risks                            and benefits of the procedure and the sedation                            options and risks were discussed with the patient.                            All questions were answered, and informed consent                            was obtained. Prior Anticoagulants: The patient has                            taken no previous anticoagulant or antiplatelet                            agents. ASA Grade Assessment: II - A patient with                            mild systemic disease. After reviewing the risks  and benefits, the patient was deemed in                            satisfactory condition to undergo the procedure.                           After obtaining informed consent, the colonoscope                            was passed under direct vision. Throughout the                            procedure, the patient's blood pressure, pulse, and                            oxygen saturations were monitored continuously.  The                            Olympus PCF-H190DL [id number]) Colonoscope was                            introduced through the anus and advanced to the                            cecum, identified by appendiceal orifice and                            ileocecal valve. The colonoscopy was performed                            without difficulty. The patient tolerated the                            procedure well. The quality of the bowel                            preparation was excellent. The ileocecal valve,                            appendiceal orifice, and rectum were photographed.                            The bowel preparation used was 2 day MiraLax +                            Moviprep via split dose instruction. Scope In: 11:11:22 AM Scope Out: 11:26:05 AM Scope Withdrawal Time: 0 hours 11 minutes 15 seconds  Total Procedure Duration: 0 hours 14 minutes 43 seconds  Findings:                 The digital rectal exam was normal.                           A 3 mm polyp was found in the descending colon. The  polyp was sessile. The polyp was removed with a                            cold snare. Resection and retrieval were complete.                           A diffuse area of moderate melanosis was found in                            the entire colon.                           Internal hemorrhoids were found during                            retroflexion. The hemorrhoids were small. Complications:            No immediate complications. Estimated Blood Loss:     Estimated blood loss was minimal. Impression:               - One 3 mm polyp in the descending colon, removed                            with a cold snare. Resected and retrieved.                           - Melanosis in the colon.                           - Small internal hemorrhoids. Recommendation:           - Patient has a contact number available for                            emergencies. The  signs and symptoms of potential                            delayed complications were discussed with the                            patient. Return to normal activities tomorrow.                            Written discharge instructions were provided to the                            patient.                           - Resume previous diet.                           - Continue present medications.                           - Await pathology results.                           -  Repeat colonoscopy is recommended. The                            colonoscopy date will be determined after pathology                            results from today's exam become available for                            review. Jerene Bears, MD 11/18/2020 11:29:57 AM This report has been signed electronically.

## 2020-11-18 NOTE — Patient Instructions (Signed)
Handout on polyps and diverticulosis given    YOU HAD AN ENDOSCOPIC PROCEDURE TODAY AT THE [facility]:   Refer to the procedure report that was given to you for any specific questions about what was found during the examination.  If the procedure report does not answer your questions, please call your gastroenterologist to clarify.  If you requested that your care partner not be given the details of your procedure findings, then the procedure report has been included in a sealed envelope for you to review at your convenience later.  YOU SHOULD EXPECT: Some feelings of bloating in the abdomen. Passage of more gas than usual.  Walking can help get rid of the air that was put into your GI tract during the procedure and reduce the bloating. If you had a lower endoscopy (such as a colonoscopy or flexible sigmoidoscopy) you may notice spotting of blood in your stool or on the toilet paper. If you underwent a bowel prep for your procedure, you may not have a normal bowel movement for a few days.  Please Note:  You might notice some irritation and congestion in your nose or some drainage.  This is from the oxygen used during your procedure.  There is no need for concern and it should clear up in a day or so.  SYMPTOMS TO REPORT IMMEDIATELY:   Following lower endoscopy (colonoscopy or flexible sigmoidoscopy):  Excessive amounts of blood in the stool  Significant tenderness or worsening of abdominal pains  Swelling of the abdomen that is new, acute  Fever of 100F or higher   For urgent or emergent issues, a gastroenterologist can be reached at any hour by calling [phone]. Do not use MyChart messaging for urgent concerns.    DIET:  We do recommend a small meal at first, but then you may proceed to your regular diet.  Drink plenty of fluids but you should avoid alcoholic beverages for 24 hours.  ACTIVITY:  You should plan to take it easy for the rest of today and you should NOT  DRIVE or use heavy machinery until tomorrow (because of the sedation medicines used during the test).    FOLLOW UP: Our staff will call the number listed on your records 48-72 hours following your procedure to check on you and address any questions or concerns that you may have regarding the information given to you following your procedure. If we do not reach you, we will leave a message.  We will attempt to reach you two times.  During this call, we will ask if you have developed any symptoms of COVID 19. If you develop any symptoms (ie: fever, flu-like symptoms, shortness of breath, cough etc.) before then, please call [phone].  If you test positive for Covid 19 in the 2 weeks post procedure, please call and report this information to us.    If any biopsies were taken you will be contacted by phone or by letter within the next 1-3 weeks.  Please call us at [phone] if you have not heard about the biopsies in 3 weeks.    SIGNATURES/CONFIDENTIALITY: You and/or your care partner have signed paperwork which will be entered into your electronic medical record.  These signatures attest to the fact that that the information above on your After Visit Summary has been reviewed and is understood.  Full responsibility of the confidentiality of this discharge information lies with you and/or your care-partner. 

## 2020-11-18 NOTE — Progress Notes (Signed)
Called to room to assist during endoscopic procedure.  Patient ID and intended procedure confirmed with present staff. Received instructions for my participation in the procedure from the performing physician.  

## 2020-11-19 ENCOUNTER — Other Ambulatory Visit: Payer: BC Managed Care – PPO

## 2020-11-19 NOTE — Progress Notes (Deleted)
GYNECOLOGY  VISIT   HPI: 60 y.o.   Divorced  Caucasian  female   G2P2002 with Patient's last menstrual period was 06/07/2010 (approximate).   here for     GYNECOLOGIC HISTORY: Patient's last menstrual period was 06/07/2010 (approximate). Contraception:  ***PMP Menopausal hormone therapy: Estradiol vaginal tablets Last mammogram: 05-24-19 3D/Neg/BiRads1 Last pap smear: 04-18-18 Neg:Neg HR HPV, 05-12-12 Neg        OB History     Gravida  2   Para  2   Term  2   Preterm      AB      Living  2      SAB      IAB      Ectopic      Multiple      Live Births  2              Patient Active Problem List   Diagnosis Date Noted   Polyarthralgia 10/28/2020   Interstitial cystitis 07/23/2020   Levator spasm 07/23/2020   Age-related osteoporosis without current pathological fracture 08/06/2019   Hyponatremia 08/06/2019   Multiple thyroid nodules 08/06/2019   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   Piriformis syndrome, right 08/09/2018   Weight gain 02/08/2018   Trigger point of right shoulder region 12/22/2017   Cervical radiculopathy at C6 11/29/2017   Osteopenia 03/09/2016   Primary osteoarthritis of left knee 12/23/2015   IBS (irritable bowel syndrome) 02/19/2015   Postmenopausal 12/19/2013   Migraine headache 12/05/2012   Pain, upper back 09/24/2010   Depression, recurrent ([place]) 09/04/2010    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   History of UTI    Migraines    PONV (postoperative nausea and vomiting)    PTSD (post-traumatic stress disorder)    on meds   STD (sexually transmitted disease)    Substance abuse ([place])    hx of alcohol    Urine incontinence     Past Surgical History:  Procedure Laterality Date   BREAST BIOPSY Left    2017 benign x 2   CESAREAN SECTION  1992   COLONOSCOPY  2018   JMP-MAC-suprep(good)-polyps   INCONTINENCE SURGERY     LAPAROSCOPY     under bladder   NECK SURGERY  02/24/2018   ACDF   POLYPECTOMY  2018   polyps removed   TOTAL HIP ARTHROPLASTY Right 09/08/2020   TOTAL HIP ARTHROPLASTY Left 2018   TOTAL KNEE ARTHROPLASTY Left 12/23/2015   Procedure: TOTAL KNEE ARTHROPLASTY;  Surgeon: Melrose Nakayama, MD;  Location: [facility];  Service: Orthopedics;  Laterality: Left;    Current Outpatient Medications  Medication Sig Dispense Refill   AIMOVIG 70 MG/ML SOAJ INJECT 70 MG INTO THE SKIN EVERY 30 (THIRTY) DAYS. 3 mL 0   Cholecalciferol (VITAMIN D3) 1.25 MG (50000 UT) CAPS TAKE 1 CAPSULE BY MOUTH ONE TIME PER WEEK 12 capsule 3   clonazePAM (KLONOPIN) 1 MG tablet Take 1 mg by mouth 2 (two) times daily.     clotrimazole-betamethasone (LOTRISONE) cream APPLY TO AFFECTED AREA TWICE A DAY (  Patient taking differently: 2 (two) times daily as needed.) 30 g 2   cyclobenzaprine (FLEXERIL) 10 MG tablet Take 1 tablet by mouth every 8 (eight) hours as needed.     escitalopram (LEXAPRO) 10 MG tablet TAKE 1 TABLET BY MOUTH EVERY DAY 90 tablet 2   esomeprazole (NEXIUM) 40 MG capsule TAKE 1 CAPSULE BY MOUTH EVERY DAY (Patient taking differently: daily as needed.) 90 capsule 3   Estradiol 10 MCG TABS vaginal tablet Place 1 tablet (10 mcg total) vaginally 2 (two) times a week. Place 1 tab nightly for two weeks then twice a week after 8 tablet 12   gabapentin (NEURONTIN) 100 MG capsule Take 2 capsules (200 mg total) by mouth at bedtime. 60 capsule 3   hydrOXYzine (ATARAX/VISTARIL) 25 MG tablet Take 1 tablet (25 mg total) by mouth at bedtime. 30 tablet 5   levofloxacin (LEVAQUIN) 500 MG tablet Take 500 mg by mouth daily. (Patient not taking: Reported on 11/18/2020)     meloxicam (MOBIC) 7.5 MG tablet TAKE 1 TABLET BY  MOUTH EVERY DAY 30 tablet 0   Multiple Vitamin (MULTIVITAMIN WITH MINERALS) TABS tablet Take 1 tablet by mouth daily.     ondansetron (ZOFRAN ODT) 4 MG disintegrating tablet Take 1 tablet (4 mg total) by mouth every 8 (eight) hours as needed for nausea or vomiting. 20 tablet 0   phenazopyridine (PYRIDIUM) 200 MG tablet Take 1 tablet (200 mg total) by mouth 3 (three) times daily as needed. 6 tablet 0   phenazopyridine (PYRIDIUM) 95 MG tablet Take 1 tablet (95 mg total) by mouth 3 (three) times daily as needed for pain. 30 tablet 5   SUMAtriptan (IMITREX) 100 MG tablet TAKE 1 TABLET BY MOUTH AS NEEDED FOR MIGRAINE HEADACHE, MAY REPEAT ONCE IN TWO HOURS AS NEEDED BUT NO MORE THAN 2 IN 24 HOURS. 9 tablet 4   valACYclovir (VALTREX) 500 MG tablet Take one tablet po bid x 3 days as needed. 30 tablet 2   No current facility-administered medications for this visit.     ALLERGIES: Sulfa antibiotics  Family History  Problem Relation Age of Onset   Hypertension Mother    Lung cancer Mother    Alcohol abuse Other    Arthritis Other    Hypertension Other    Mental illness Other    Kidney cancer Father    Ulcers Father    Drug abuse Sister    Hypertension Maternal Grandmother    Colon cancer Neg Hx    Esophageal cancer Neg Hx    Colon polyps Neg Hx    Rectal cancer Neg Hx    Stomach cancer Neg Hx     Social History   Socioeconomic History   Marital status: Divorced    Spouse name: Not on file   Number of children: Not on file   Years of education: Not on file   Highest education level: Not on file  Occupational History   Not on file  Tobacco Use   Smoking status: Former    Packs/day: 0.50    Years: 10.00    Pack years: 5.00    Types: Cigarettes    Quit date: 11/13/2010    Years since quitting: 10.0   Smokeless tobacco: Never  Vaping Use   Vaping Use: Never used  Substance and Sexual Activity   Alcohol use: No    Comment: hx of ETOH abuse-sober since 11/2010    Drug use: No    Sexual activity: Not Currently  Birth control/protection: Post-menopausal  Other Topics Concern   Not on file  Social History Narrative   Not on file   Social Determinants of Health   Financial Resource Strain: Not on file  Food Insecurity: Not on file  Transportation Needs: Not on file  Physical Activity: Not on file  Stress: Not on file  Social Connections: Not on file  Intimate Partner Violence: Not on file    Review of Systems  PHYSICAL EXAMINATION:    LMP 06/07/2010 (Approximate)     General appearance: alert, cooperative and appears stated age Head: Normocephalic, without obvious abnormality, atraumatic Neck: no adenopathy, supple, symmetrical, trachea midline and thyroid normal to inspection and palpation Lungs: clear to auscultation bilaterally Breasts: normal appearance, no masses or tenderness, No nipple retraction or dimpling, No nipple discharge or bleeding, No axillary or supraclavicular adenopathy Heart: regular rate and rhythm Abdomen: soft, non-tender, no masses,  no organomegaly Extremities: extremities normal, atraumatic, no cyanosis or edema Skin: Skin color, texture, turgor normal. No rashes or lesions Lymph nodes: Cervical, supraclavicular, and axillary nodes normal. No abnormal inguinal nodes palpated Neurologic: Grossly normal  Pelvic: External genitalia:  no lesions              Urethra:  normal appearing urethra with no masses, tenderness or lesions              Bartholins and Skenes: normal                 Vagina: normal appearing vagina with normal color and discharge, no lesions              Cervix: no lesions                Bimanual Exam:  Uterus:  normal size, contour, position, consistency, mobility, non-tender              Adnexa: no mass, fullness, tenderness              Rectal exam: {yes no:[id number]}.  Confirms.              Anus:  normal sphincter tone, no lesions  Chaperone was present for exam.  ASSESSMENT     PLAN     An  After Visit Summary was printed and given to the patient.  ______ minutes face to face time of which over 50% was spent in counseling.

## 2020-11-20 ENCOUNTER — Other Ambulatory Visit: Payer: Self-pay | Admitting: Family Medicine

## 2020-11-20 ENCOUNTER — Telehealth: Payer: Self-pay | Admitting: *Deleted

## 2020-11-20 ENCOUNTER — Telehealth: Payer: Self-pay

## 2020-11-20 DIAGNOSIS — F4312 Post-traumatic stress disorder, chronic: Secondary | ICD-10-CM | POA: Diagnosis not present

## 2020-11-20 NOTE — Telephone Encounter (Signed)
LVM

## 2020-11-20 NOTE — Telephone Encounter (Signed)
Attempted phone call. No answer. Left message.  

## 2020-11-21 NOTE — Telephone Encounter (Signed)
Rx was discontinued on 07/01/2020 by Sherlene Shams MD  Please advise. Ok to refill?

## 2020-11-24 ENCOUNTER — Other Ambulatory Visit: Payer: Self-pay

## 2020-11-24 ENCOUNTER — Ambulatory Visit (INDEPENDENT_AMBULATORY_CARE_PROVIDER_SITE_OTHER): Payer: BC Managed Care – PPO | Admitting: Obstetrics and Gynecology

## 2020-11-24 ENCOUNTER — Encounter: Payer: Self-pay | Admitting: Obstetrics and Gynecology

## 2020-11-24 VITALS — BP 118/66 | HR 62 | Ht 64.0 in | Wt 136.0 lb

## 2020-11-24 DIAGNOSIS — R300 Dysuria: Secondary | ICD-10-CM

## 2020-11-24 DIAGNOSIS — B009 Herpesviral infection, unspecified: Secondary | ICD-10-CM | POA: Diagnosis not present

## 2020-11-24 DIAGNOSIS — K649 Unspecified hemorrhoids: Secondary | ICD-10-CM | POA: Diagnosis not present

## 2020-11-24 DIAGNOSIS — Z01419 Encounter for gynecological examination (general) (routine) without abnormal findings: Secondary | ICD-10-CM

## 2020-11-24 LAB — URINALYSIS, COMPLETE W/RFL CULTURE
Bacteria, UA: NONE SEEN /HPF
Bilirubin Urine: NEGATIVE
Glucose, UA: NEGATIVE
Hgb urine dipstick: NEGATIVE
Hyaline Cast: NONE SEEN /LPF
Ketones, ur: NEGATIVE
Leukocyte Esterase: NEGATIVE
Nitrites, Initial: NEGATIVE
Protein, ur: NEGATIVE
RBC / HPF: NONE SEEN /HPF (ref 0–2)
Specific Gravity, Urine: 1.015 (ref 1.001–1.035)
WBC, UA: NONE SEEN /HPF (ref 0–5)
pH: 6 (ref 5.0–8.0)

## 2020-11-24 LAB — NO CULTURE INDICATED

## 2020-11-24 MED ORDER — VALACYCLOVIR HCL 500 MG PO TABS: ORAL_TABLET | ORAL | 3 refills | Status: DC

## 2020-11-24 MED ORDER — HYDROCORTISONE (PERIANAL) 2.5 % EX CREA: TOPICAL_CREAM | Freq: Two times a day (BID) | CUTANEOUS | 1 refills | Status: DC

## 2020-11-24 NOTE — Progress Notes (Signed)
60 y.o. G71P2002 Divorced Caucasian female here for annual exam.    New dx of interstitial cystitis.  She sees Dr. Wannetta Sender and is doing pelvic floor PT. She takes hydroxyzine.   She has some dysuria today and left urine sample today.   Not using vaginal estrogen tablets regularly.   Wants Rx for hemorrhoids.  May have a new dx of SIADH.  She has low sodium.  She will have brain imaging.   Has thyroid cyst and will have a thyroid ultrasound.  She will see Dr. Harlow Asa for potential discussion of partial thyroidectomy.   New dx of osteoporosis.  Considering Prolia.  Just had dental implant. Has appointment with dentist today.   Will see Dr. Bennie Dallas for potential RA.   PCP: Carolann Littler, MD  Patient's last menstrual period was 06/07/2010 (approximate).           Sexually active: No.  Not active for many years.  The current method of family planning is post menopausal status.    Exercising: No.  The patient does not participate in regular exercise at present. Smoker:  Former  Health Maintenance: Pap: 04-18-18 Neg:Neg HR HPV, 05-12-12 Neg History of abnormal Pap:  yes, Hx of cryotherapy to cervix years ago MMG: 05-24-19 3D/NegBiRads1.  She will schedule.  Colonoscopy:  Last week;1 polyp BMD: 11-05-20 Result  :Osteoporosis.  Followed by Dr. Cruzita Lederer.  TDaP: 04-30-19 Gardasil:   no NKN:[id number] Hep C:Unsure Screening Labs:  PCP.  Endocrinology.    reports that she quit smoking about 10 years ago. Her smoking use included cigarettes. She has a 5.00 pack-year smoking history. She has never used smokeless tobacco. She reports that she does not drink alcohol and does not use drugs.  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   History of UTI    Interstitial cystitis    Low sodium levels    Migraines    Osteoporosis    PONV (postoperative nausea and vomiting)    PTSD (post-traumatic stress disorder)    on meds   STD (sexually transmitted disease)    Substance abuse ([place])    hx of alcohol   Urine incontinence     Past Surgical History:  Procedure Laterality Date   BREAST BIOPSY Left    2017 benign x 2   CESAREAN SECTION  1992   COLONOSCOPY  2018   JMP-MAC-suprep(good)-polyps   INCONTINENCE SURGERY     LAPAROSCOPY     under bladder   NECK SURGERY  02/24/2018   ACDF   POLYPECTOMY  2018   polyps removed   TOTAL HIP ARTHROPLASTY Right 09/08/2020   TOTAL HIP ARTHROPLASTY Left 2018   TOTAL KNEE ARTHROPLASTY Left 12/23/2015   Procedure: TOTAL KNEE ARTHROPLASTY;  Surgeon: Melrose Nakayama, MD;  Location: [facility];  Service: Orthopedics;  Laterality: Left;    Current Outpatient Medications  Medication Sig Dispense Refill   acetaminophen-codeine (TYLENOL #3) 300-30 MG tablet Take 1 tablet by mouth 2 (two) times daily as needed.     AIMOVIG 70 MG/ML SOAJ INJECT 70 MG INTO  THE SKIN EVERY 30 (THIRTY) DAYS. 3 mL 0   Cholecalciferol (VITAMIN D3) 1.25 MG (50000 UT) CAPS TAKE 1 CAPSULE BY MOUTH ONE TIME PER WEEK 12 capsule 3   clonazePAM (KLONOPIN) 1 MG tablet Take 1 mg by mouth 2 (two) times daily.     clotrimazole-betamethasone (LOTRISONE) cream APPLY TO AFFECTED AREA TWICE A DAY (Patient taking differently: 2 (two) times daily as needed.) 30 g 2   cyclobenzaprine (FLEXERIL) 10 MG tablet Take 1 tablet by mouth every 8 (eight) hours as needed.     escitalopram (LEXAPRO) 10 MG tablet TAKE 1 TABLET BY MOUTH EVERY DAY 90 tablet 2   esomeprazole (NEXIUM) 40 MG capsule TAKE 1 CAPSULE BY MOUTH EVERY DAY (Patient taking differently: daily as needed.) 90 capsule 3   Estradiol 10 MCG TABS vaginal tablet Place 1 tablet (10 mcg total) vaginally 2 (two) times  a week. Place 1 tab nightly for two weeks then twice a week after 8 tablet 12   gabapentin (NEURONTIN) 100 MG capsule Take 2 capsules (200 mg total) by mouth at bedtime. 60 capsule 3   hydrOXYzine (ATARAX/VISTARIL) 25 MG tablet Take 1 tablet (25 mg total) by mouth at bedtime. 30 tablet 5   meloxicam (MOBIC) 7.5 MG tablet TAKE 1 TABLET BY MOUTH EVERY DAY 30 tablet 0   Multiple Vitamin (MULTIVITAMIN WITH MINERALS) TABS tablet Take 1 tablet by mouth daily.     ondansetron (ZOFRAN ODT) 4 MG disintegrating tablet Take 1 tablet (4 mg total) by mouth every 8 (eight) hours as needed for nausea or vomiting. 20 tablet 0   phenazopyridine (PYRIDIUM) 200 MG tablet Take 1 tablet (200 mg total) by mouth 3 (three) times daily as needed. 6 tablet 0   SUMAtriptan (IMITREX) 100 MG tablet TAKE 1 TABLET BY MOUTH AS NEEDED FOR MIGRAINE HEADACHE, MAY REPEAT ONCE IN TWO HOURS AS NEEDED BUT NO MORE THAN 2 IN 24 HOURS. 9 tablet 4   valACYclovir (VALTREX) 500 MG tablet Take one tablet po bid x 3 days as needed. 30 tablet 2   No current facility-administered medications for this visit.    Family History  Problem Relation Age of Onset   Hypertension Mother    Lung cancer Mother    Alcohol abuse Other    Arthritis Other    Hypertension Other    Mental illness Other    Kidney cancer Father    Ulcers Father    Drug abuse Sister    Hypertension Maternal Grandmother    Colon cancer Neg Hx    Esophageal cancer Neg Hx    Colon polyps Neg Hx    Rectal cancer Neg Hx    Stomach cancer Neg Hx     Review of Systems  See HPI.   Exam:   BP 118/66   Pulse 62   Ht _0  (1.626 m)   Wt 136 lb (61.7 kg)   LMP 06/07/2010 (Approximate)   SpO2 100%   BMI 23.34 kg/m     General appearance: alert, cooperative and appears stated age Head: normocephalic, without obvious abnormality, atraumatic Neck: no adenopathy, supple, symmetrical, trachea midline and thyroid enlarged and irregular.  Lungs: clear to auscultation  bilaterally Breasts: normal appearance, no masses or tenderness, No nipple retraction or dimpling, No nipple discharge or bleeding, No axillary adenopathy Heart: regular rate and rhythm Abdomen: soft, non-tender; no masses, no organomegaly Extremities: extremities normal, atraumatic, no cyanosis or edema Skin: skin color, texture, turgor normal. No rashes or lesions Lymph  nodes: cervical, supraclavicular, and axillary nodes normal. Neurologic: grossly normal  Pelvic: External genitalia:  no lesions              No abnormal inguinal nodes palpated.              Urethra:  normal appearing urethra with no masses, tenderness or lesions              Bartholins and Skenes: normal                 Vagina: normal appearing vagina with normal color and discharge, no lesions              Cervix: no lesions              Pap taken: no Bimanual Exam:  Uterus:  normal size, contour, position, consistency, mobility, non-tender              Adnexa: no mass, fullness, tenderness              Rectal exam: yes. Confirms.              Anus:  normal sphincter tone, no lesions  Chaperone was present for exam.  Assessment:   Well woman visit with normal exam. Status post sling.  Urinary incontinence.  Interstitial cystitis.  Dysuria.  Vulvodynia.  Vaginal atrophy.  HSV. Osteoporosis.  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  Hemorrhoids.   Plan: Mammogram screening discussed.   Self breast awareness reviewed. Pap and HR HPV 2024.  Guidelines for Calcium, Vitamin D, regular exercise program including cardiovascular and weight bearing exercise. Gabapentin discussed as tx for vulvodynia.   She already has an Rx which she is not taking for other chronic pain.  She may restart this.  Refill of Valtrex 500 mg po bid x 3 days.  Anusol HC cream to rectum twice daily as needed. Can refill vaginal estrogen after mammogram is back and normal.  Potential effect on breast cancer reviewed.  Check urine.  Follow up annually  and prn.    After visit summary provided.

## 2020-11-24 NOTE — Patient Instructions (Signed)

## 2020-11-24 NOTE — Telephone Encounter (Signed)
ATC, mail box was full.

## 2020-11-25 ENCOUNTER — Ambulatory Visit
Admission: RE | Admit: 2020-11-25 | Discharge: 2020-11-25 | Disposition: A | Discharge status: Home / Self Care | Payer: BC Managed Care – PPO | Source: Ambulatory Visit | Attending: Internal Medicine | Admitting: Internal Medicine

## 2020-11-25 DIAGNOSIS — E222 Syndrome of inappropriate secretion of antidiuretic hormone: Secondary | ICD-10-CM

## 2020-11-25 DIAGNOSIS — G939 Disorder of brain, unspecified: Secondary | ICD-10-CM | POA: Diagnosis not present

## 2020-11-25 MED ORDER — GADOBENATE DIMEGLUMINE 529 MG/ML IV SOLN
12.0000 mL | Freq: Once | INTRAVENOUS | Status: AC | PRN
  Administered 2020-11-25: 12 mL via INTRAVENOUS

## 2020-11-26 DIAGNOSIS — R102 Pelvic and perineal pain: Secondary | ICD-10-CM | POA: Diagnosis not present

## 2020-11-26 DIAGNOSIS — N3010 Interstitial cystitis (chronic) without hematuria: Secondary | ICD-10-CM | POA: Diagnosis not present

## 2020-11-26 DIAGNOSIS — M62838 Other muscle spasm: Secondary | ICD-10-CM | POA: Diagnosis not present

## 2020-11-26 DIAGNOSIS — M25551 Pain in right hip: Secondary | ICD-10-CM | POA: Diagnosis not present

## 2020-11-26 NOTE — Telephone Encounter (Signed)
ATC, mail box is full

## 2020-11-27 ENCOUNTER — Encounter: Payer: Self-pay | Admitting: Family Medicine

## 2020-11-27 DIAGNOSIS — M810 Age-related osteoporosis without current pathological fracture: Secondary | ICD-10-CM

## 2020-11-28 ENCOUNTER — Other Ambulatory Visit: Payer: Self-pay

## 2020-11-28 ENCOUNTER — Ambulatory Visit (INDEPENDENT_AMBULATORY_CARE_PROVIDER_SITE_OTHER): Payer: BC Managed Care – PPO

## 2020-11-28 DIAGNOSIS — Z23 Encounter for immunization: Secondary | ICD-10-CM

## 2020-11-28 DIAGNOSIS — M533 Sacrococcygeal disorders, not elsewhere classified: Secondary | ICD-10-CM | POA: Diagnosis not present

## 2020-11-28 DIAGNOSIS — M545 Low back pain, unspecified: Secondary | ICD-10-CM | POA: Diagnosis not present

## 2020-11-28 DIAGNOSIS — M4722 Other spondylosis with radiculopathy, cervical region: Secondary | ICD-10-CM | POA: Diagnosis not present

## 2020-11-28 DIAGNOSIS — M25551 Pain in right hip: Secondary | ICD-10-CM | POA: Diagnosis not present

## 2020-11-28 DIAGNOSIS — M25651 Stiffness of right hip, not elsewhere classified: Secondary | ICD-10-CM | POA: Diagnosis not present

## 2020-11-28 DIAGNOSIS — M7061 Trochanteric bursitis, right hip: Secondary | ICD-10-CM | POA: Diagnosis not present

## 2020-11-28 NOTE — Progress Notes (Signed)
Per orders of Dr. Elease Hashimoto, injection of Shingrix given by Rebecca Eaton. Patient tolerated injection well.

## 2020-11-30 ENCOUNTER — Other Ambulatory Visit: Payer: Self-pay | Admitting: Family Medicine

## 2020-12-02 ENCOUNTER — Encounter: Payer: Self-pay | Admitting: Internal Medicine

## 2020-12-03 DIAGNOSIS — M62838 Other muscle spasm: Secondary | ICD-10-CM | POA: Diagnosis not present

## 2020-12-03 DIAGNOSIS — R102 Pelvic and perineal pain: Secondary | ICD-10-CM | POA: Diagnosis not present

## 2020-12-03 DIAGNOSIS — N942 Vaginismus: Secondary | ICD-10-CM | POA: Diagnosis not present

## 2020-12-04 ENCOUNTER — Other Ambulatory Visit: Payer: BC Managed Care – PPO

## 2020-12-05 ENCOUNTER — Other Ambulatory Visit: Payer: Self-pay | Admitting: Family Medicine

## 2020-12-05 ENCOUNTER — Ambulatory Visit
Admission: RE | Admit: 2020-12-05 | Discharge: 2020-12-05 | Disposition: A | Discharge status: Home / Self Care | Payer: BC Managed Care – PPO | Source: Ambulatory Visit | Attending: Family Medicine | Admitting: Family Medicine

## 2020-12-05 DIAGNOSIS — E041 Nontoxic single thyroid nodule: Secondary | ICD-10-CM | POA: Diagnosis not present

## 2020-12-09 ENCOUNTER — Ambulatory Visit (INDEPENDENT_AMBULATORY_CARE_PROVIDER_SITE_OTHER): Payer: BC Managed Care – PPO | Admitting: Family Medicine

## 2020-12-09 ENCOUNTER — Encounter: Payer: Self-pay | Admitting: Family Medicine

## 2020-12-09 ENCOUNTER — Other Ambulatory Visit: Payer: Self-pay

## 2020-12-09 VITALS — BP 120/78 | HR 71 | Temp 98.1°F | Ht 64.0 in | Wt 138.5 lb

## 2020-12-09 DIAGNOSIS — F339 Major depressive disorder, recurrent, unspecified: Secondary | ICD-10-CM | POA: Diagnosis not present

## 2020-12-09 DIAGNOSIS — E871 Hypo-osmolality and hyponatremia: Secondary | ICD-10-CM

## 2020-12-09 DIAGNOSIS — M533 Sacrococcygeal disorders, not elsewhere classified: Secondary | ICD-10-CM

## 2020-12-09 DIAGNOSIS — F419 Anxiety disorder, unspecified: Secondary | ICD-10-CM

## 2020-12-09 LAB — BASIC METABOLIC PANEL
BUN: 13 mg/dL (ref 6–23)
CO2: 27 mEq/L (ref 19–32)
Calcium: 9 mg/dL (ref 8.4–10.5)
Chloride: 94 mEq/L — ABNORMAL LOW (ref 96–112)
Creatinine, Ser: 0.63 mg/dL (ref 0.40–1.20)
GFR: 96.72 mL/min (ref >60.00)
Glucose, Bld: 69 mg/dL — ABNORMAL LOW (ref 70–99)
Potassium: 4.8 mEq/L (ref 3.5–5.1)
Sodium: 127 mEq/L — ABNORMAL LOW (ref 135–145)

## 2020-12-09 MED ORDER — CLONAZEPAM 1 MG PO TABS: 1.0000 mg | ORAL_TABLET | Freq: Two times a day (BID) | ORAL | 3 refills | Status: DC

## 2020-12-09 NOTE — Progress Notes (Signed)
Gabriela Shepard [address] [place] Phone: [phone] Subjective:   I Gabriela Shepard am serving as a Education administrator for Dr. Hulan Saas.  This visit occurred during the SARS-CoV-2 public health emergency.  Safety protocols were in place, including screening questions prior to the visit, additional usage of staff PPE, and extensive cleaning of exam room while observing appropriate contact time as indicated for disinfecting solutions.   I'm seeing this patient by the request  of:  Eulas Post, MD  CC: Right hip pain follow-up  [id number]  10/28/2020 Discussed with patient symptoms well.  We will hold on any type of manipulation.  We will get x-rays to further evaluate at this time.  Patient recently did have the hip replacement and we will get an x-ray to make sure nothing else at this abnormal the patient is walking rather well.  Discussed home exercises and icing regimen.  We will get laboratory work-up to rule out anything else that could be contributing to some of her polyarthralgia.  Follow-up again in 6 to 8 weeks  Encouraged her to consider the possibility of some of the different medications.  Patient will do more weightbearing exercises.  Discussed vitamin D.  Laboratory work-up ordered today for vitamin D.  History of neck surgery 2 years ago.  Starting to have very similar presentation.  We will get x-rays today to further evaluate.  Started on a low-dose of gabapentin which I think will be beneficial as well.  Warned of potential side effects.  Hopefully will help with some of the sleep difficulties she is having as well.  Follow-up again in 6 weeks  Patient is having signs and symptoms consistent with more of a polyarthralgia.  Concern for this at this moment.  Patient has been through a lot in the last couple years ago.  Discussed with patient to start with potential laboratory work-up.  Patient does have the osteoporosis as well and  encouraged her to follow-up with the endocrinology.  Discussed which activities to do which wants to avoid.  Patient will start to increase activity where she can.  Patient given a short course of a very low dose of meloxicam.  Patient will do it in 5 days burst.  Patient does have a past medical history significant for some mild sulfur antibiotics with hives but will monitor and has never had trouble with ibuprofen.  Follow-up with me again in 6 weeks otherwise.  12/09/2020 Gabriela Shepard is a 60 y.o. female coming in with complaint of right hip pain. Patient states it is more SI joint and ischial tuberosity. Patient states she has been very depressed and that gabapentin might not be best for her. Has been to PT and states that it makes it worse.  Patient states that she is always with some type of discomfort.  Patient is 3 months out from the surgery at this time.  Patient does not have any instability noted of the hip.  Patient did have a bone scan done that was unremarkable.  Continues to have pain in the SI joint.  Past medical history is significant for osteoporosis.  Patient does state that she has had some increasing depression recently.  Wanting to know if gabapentin is potentially causing it.  Patient did have an MRI of the brain with and showed the patient questionably has a dural calcification versus angioma but both of them seem to be benign in nature.    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   History of UTI    Interstitial cystitis    Low sodium levels    Migraines    Osteoporosis    PONV (postoperative nausea and vomiting)    PTSD (post-traumatic stress  disorder)    on meds   STD (sexually transmitted disease)    Substance abuse ([place])    hx of alcohol   Urine incontinence    Past Surgical History:  Procedure Laterality Date   BREAST BIOPSY Left    2017 benign x 2   CESAREAN SECTION  1992   COLONOSCOPY  2018   JMP-MAC-suprep(good)-polyps   INCONTINENCE SURGERY     LAPAROSCOPY     under bladder   NECK SURGERY  02/24/2018   ACDF   POLYPECTOMY  2018   polyps removed   TOTAL HIP ARTHROPLASTY Right 09/08/2020   TOTAL HIP ARTHROPLASTY Left 2018   TOTAL KNEE ARTHROPLASTY Left 12/23/2015   Procedure: TOTAL KNEE ARTHROPLASTY;  Surgeon: Melrose Nakayama, MD;  Location: [facility];  Service: Orthopedics;  Laterality: Left;   Social History   Socioeconomic History   Marital status: Divorced    Spouse name: Not on file   Number of children: Not on file   Years of education: Not on file   Highest education level: Not on file  Occupational History   Not on file  Tobacco Use   Smoking status: Former    Packs/day: 0.50    Years: 10.00    Pack years: 5.00    Types: Cigarettes    Quit date: 11/13/2010    Years since quitting: 10.0   Smokeless tobacco: Never  Vaping Use   Vaping Use: Never used  Substance and Sexual Activity   Alcohol use: No    Comment: hx of ETOH abuse-sober since 11/2010    Drug use: No   Sexual activity: Not Currently    Birth control/protection: Post-menopausal  Other Topics Concern   Not on file  Social History Narrative   Not on file   Social Determinants of Health   Financial Resource Strain: Not on file  Food Insecurity: Not on file  Transportation Needs: Not on file  Physical Activity: Not on file  Stress: Not on file  Social Connections: Not on file   Allergies  Allergen Reactions   Sulfa Antibiotics Hives   Family History  Problem Relation Age of Onset   Hypertension Mother    Lung cancer Mother    Alcohol abuse Other    Arthritis Other    Hypertension Other    Mental illness Other     Kidney cancer Father    Ulcers Father    Drug abuse Sister    Hypertension Maternal Grandmother    Colon cancer Neg Hx    Esophageal cancer Neg Hx    Colon polyps Neg Hx    Rectal cancer Neg Hx    Stomach cancer Neg Hx        Current Outpatient Medications (Analgesics):    acetaminophen-codeine (TYLENOL #3) 300-30 MG tablet, Take 1 tablet by  mouth 2 (two) times daily as needed.   AIMOVIG 70 MG/ML SOAJ, INJECT 70 MG INTO THE SKIN EVERY 30 (THIRTY) DAYS.   meloxicam (MOBIC) 7.5 MG tablet, TAKE 1 TABLET BY MOUTH EVERY DAY   SUMAtriptan (IMITREX) 100 MG tablet, TAKE 1 TABLET BY MOUTH AS NEEDED FOR MIGRAINE HEADACHE, MAY REPEAT ONCE IN TWO HOURS AS NEEDED BUT NO MORE THAN 2 IN 24 HOURS.   Current Outpatient Medications (Other):    Cholecalciferol (VITAMIN D3) 1.25 MG (50000 UT) CAPS, TAKE 1 CAPSULE BY MOUTH ONE TIME PER WEEK   clonazePAM (KLONOPIN) 1 MG tablet, Take 1 mg by mouth 2 (two) times daily.   clotrimazole-betamethasone (LOTRISONE) cream, APPLY TO AFFECTED AREA TWICE A DAY (Patient taking differently: 2 (two) times daily as needed.)   cyclobenzaprine (FLEXERIL) 10 MG tablet, Take 1 tablet by mouth every 8 (eight) hours as needed.   escitalopram (LEXAPRO) 10 MG tablet, TAKE 1 TABLET BY MOUTH EVERY DAY   esomeprazole (NEXIUM) 40 MG capsule, TAKE 1 CAPSULE BY MOUTH EVERY DAY (Patient taking differently: daily as needed.)   Estradiol 10 MCG TABS vaginal tablet, Place 1 tablet (10 mcg total) vaginally 2 (two) times a week. Place 1 tab nightly for two weeks then twice a week after   gabapentin (NEURONTIN) 100 MG capsule, Take 2 capsules (200 mg total) by mouth at bedtime.   hydrocortisone (ANUSOL-HC) 2.5 % rectal cream, Place rectally 2 (two) times daily. Use as needed for hemorrhoids.   hydrOXYzine (ATARAX/VISTARIL) 25 MG tablet, Take 1 tablet (25 mg total) by mouth at bedtime.   Multiple Vitamin (MULTIVITAMIN WITH MINERALS) TABS tablet, Take 1 tablet by mouth daily.   ondansetron  (ZOFRAN ODT) 4 MG disintegrating tablet, Take 1 tablet (4 mg total) by mouth every 8 (eight) hours as needed for nausea or vomiting.   phenazopyridine (PYRIDIUM) 200 MG tablet, Take 1 tablet (200 mg total) by mouth 3 (three) times daily as needed.   valACYclovir (VALTREX) 500 MG tablet, Take one tablet po bid x 3 days as needed.   Reviewed prior external information including notes and imaging from  primary care provider As well as notes that were available from care everywhere and other healthcare systems.  Past medical history, social, surgical and family history all reviewed in electronic medical record.  No pertanent information unless stated regarding to the chief complaint.   Review of Systems:  No headache, visual changes, nausea, vomiting, diarrhea, constipation, dizziness, abdominal pain, skin rash, fevers, chills, night sweats, weight loss, swollen lymph nodes,  joint swelling, chest pain, shortness of breath, . POSITIVE muscle aches, body aches, mood changes  Objective  Blood pressure 140/80, pulse 66, height _0  (1.626 m), weight 137 lb (62.1 kg), last menstrual period 06/07/2010, SpO2 98 %.   General: No apparent distress alert and oriented x3 mood and affect normal, dressed appropriately.  Patient is very tearful. HEENT: Pupils equal, extraocular movements intact  Respiratory: Patient's speak in full sentences and does not appear short of breath  Cardiovascular: No lower extremity edema, non tender, no erythema  Gait very minorly antalgic Continued tenderness noted over the greater trochanteric area and tightness noted of the hamstring.  Tightness noted of the right sacroiliac joint.  Did not do strength testing today secondary to patient being very uncomfortable.   Impression and Recommendations:     The above documentation has been reviewed and is accurate and complete Lyndal Pulley, DO

## 2020-12-09 NOTE — Patient Instructions (Signed)
Let me know if depression not improving with stopping the Gabapentin  We could increase the Lexapro to 15 mg daily if not improved.

## 2020-12-09 NOTE — Progress Notes (Signed)
Established Patient Office Visit  Subjective:  Patient ID: Gabriela Shepard, female    DOB: 1960-10-27  Age: 60 y.o. MRN: [id number]  CC:  Chief Complaint  Patient presents with   med check    HPI Gabriela Shepard presents for several items as follows  She has remote history of alcohol abuse.  She has been abstinent now for several years.  She has history of some chronic anxiety and has been on clonazepam 1 mg twice daily but does not actually take this that regularly.  No history of misuse.  She also takes Lexapro 10 mg daily which has helped significantly with her anxiety symptoms.  She feels that she is having some worsening of depression symptoms over the past year.  She was prescribed gabapentin per sports medicine and feels like that may be exacerbating her depression.  She has already discussed with sports medicine physician about stopping the gabapentin and she plans to do so.  There have been some mention of her taking Cymbalta because of her chronic pain issues but she is very reluctant.  She previously took Effexor for depression but felt more anxious with this.  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  She has been worked up by endocrinology and has CT of the lung pending.  She had MRI of the brain which did not show any acute findings.  Does not take any thiazides.  Her hyponatremia predated her SSRI medication.  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   History of UTI    Interstitial cystitis    Low sodium levels    Migraines    Osteoporosis    PONV (postoperative nausea and vomiting)    PTSD  (post-traumatic stress disorder)    on meds   STD (sexually transmitted disease)    Substance abuse ([place])    hx of alcohol   Urine incontinence     Past Surgical History:  Procedure Laterality Date   BREAST BIOPSY Left    2017 benign x 2   CESAREAN SECTION  1992   COLONOSCOPY  2018   JMP-MAC-suprep(good)-polyps   INCONTINENCE SURGERY     LAPAROSCOPY     under bladder   NECK SURGERY  02/24/2018   ACDF   POLYPECTOMY  2018   polyps removed   TOTAL HIP ARTHROPLASTY Right 09/08/2020   TOTAL HIP ARTHROPLASTY Left 2018   TOTAL KNEE ARTHROPLASTY Left 12/23/2015   Procedure: TOTAL KNEE ARTHROPLASTY;  Surgeon: Melrose Nakayama, MD;  Location: [facility];  Service: Orthopedics;  Laterality: Left;    Family History  Problem Relation Age of Onset   Hypertension Mother    Lung cancer Mother    Alcohol abuse Other    Arthritis Other    Hypertension Other    Mental illness Other    Kidney cancer Father    Ulcers Father    Drug abuse Sister    Hypertension Maternal Grandmother    Colon cancer Neg Hx    Esophageal cancer Neg Hx  Colon polyps Neg Hx    Rectal cancer Neg Hx    Stomach cancer Neg Hx     Social History   Socioeconomic History   Marital status: Divorced    Spouse name: Not on file   Number of children: Not on file   Years of education: Not on file   Highest education level: Not on file  Occupational History   Not on file  Tobacco Use   Smoking status: Former    Packs/day: 0.50    Years: 10.00    Pack years: 5.00    Types: Cigarettes    Quit date: 11/13/2010    Years since quitting: 10.0   Smokeless tobacco: Never  Vaping Use   Vaping Use: Never used  Substance and Sexual Activity   Alcohol use: No    Comment: hx of ETOH abuse-sober since 11/2010    Drug use: No   Sexual activity: Not Currently    Birth control/protection: Post-menopausal  Other Topics Concern   Not on file  Social History Narrative   Not on file   Social Determinants of Health    Financial Resource Strain: Not on file  Food Insecurity: Not on file  Transportation Needs: Not on file  Physical Activity: Not on file  Stress: Not on file  Social Connections: Not on file  Intimate Partner Violence: Not on file    Outpatient Medications Prior to Visit  Medication Sig Dispense Refill   acetaminophen-codeine (TYLENOL #3) 300-30 MG tablet Take 1 tablet by mouth 2 (two) times daily as needed.     AIMOVIG 70 MG/ML SOAJ INJECT 70 MG INTO THE SKIN EVERY 30 (THIRTY) DAYS. 3 mL 0   Cholecalciferol (VITAMIN D3) 1.25 MG (50000 UT) CAPS TAKE 1 CAPSULE BY MOUTH ONE TIME PER WEEK 12 capsule 3   clotrimazole-betamethasone (LOTRISONE) cream APPLY TO AFFECTED AREA TWICE A DAY (Patient taking differently: 2 (two) times daily as needed.) 30 g 2   cyclobenzaprine (FLEXERIL) 10 MG tablet Take 1 tablet by mouth every 8 (eight) hours as needed.     escitalopram (LEXAPRO) 10 MG tablet TAKE 1 TABLET BY MOUTH EVERY DAY 90 tablet 2   esomeprazole (NEXIUM) 40 MG capsule TAKE 1 CAPSULE BY MOUTH EVERY DAY (Patient taking differently: daily as needed.) 90 capsule 3   Estradiol 10 MCG TABS vaginal tablet Place 1 tablet (10 mcg total) vaginally 2 (two) times a week. Place 1 tab nightly for two weeks then twice a week after 8 tablet 12   gabapentin (NEURONTIN) 100 MG capsule Take 2 capsules (200 mg total) by mouth at bedtime. 60 capsule 3   hydrocortisone (ANUSOL-HC) 2.5 % rectal cream Place rectally 2 (two) times daily. Use as needed for hemorrhoids. 30 g 1   hydrOXYzine (ATARAX/VISTARIL) 25 MG tablet Take 1 tablet (25 mg total) by mouth at bedtime. 30 tablet 5   meloxicam (MOBIC) 7.5 MG tablet TAKE 1 TABLET BY MOUTH EVERY DAY 30 tablet 0   Multiple Vitamin (MULTIVITAMIN WITH MINERALS) TABS tablet Take 1 tablet by mouth daily.     ondansetron (ZOFRAN ODT) 4 MG disintegrating tablet Take 1 tablet (4 mg total) by mouth every 8 (eight) hours as needed for nausea or vomiting. 20 tablet 0   phenazopyridine  (PYRIDIUM) 200 MG tablet Take 1 tablet (200 mg total) by mouth 3 (three) times daily as needed. 6 tablet 0   SUMAtriptan (IMITREX) 100 MG tablet TAKE 1 TABLET BY MOUTH AS NEEDED FOR MIGRAINE HEADACHE, MAY REPEAT  ONCE IN TWO HOURS AS NEEDED BUT NO MORE THAN 2 IN 24 HOURS. 9 tablet 4   valACYclovir (VALTREX) 500 MG tablet Take one tablet po bid x 3 days as needed. 30 tablet 3   clonazePAM (KLONOPIN) 1 MG tablet Take 1 mg by mouth 2 (two) times daily.     No facility-administered medications prior to visit.    Allergies  Allergen Reactions   Sulfa Antibiotics Hives    ROS Review of Systems  Constitutional:  Positive for fatigue. Negative for chills, fever and unexpected weight change.  Respiratory:  Negative for cough and shortness of breath.   Psychiatric/Behavioral:  Positive for dysphoric mood. Negative for agitation and suicidal ideas.      Objective:    Physical Exam Vitals reviewed.  Constitutional:      Appearance: Normal appearance.  Cardiovascular:     Rate and Rhythm: Normal rate and regular rhythm.  Pulmonary:     Effort: Pulmonary effort is normal.     Breath sounds: Normal breath sounds.  Neurological:     Mental Status: She is alert.     Cranial Nerves: No cranial nerve deficit.    BP 120/78 (BP Location: Right Arm, Patient Position: Sitting, Cuff Size: Normal)   Pulse 71   Temp 98.1 F (36.7 C) (Oral)   Ht _0  (1.626 m)   Wt 138 lb 8 oz (62.8 kg)   LMP 06/07/2010 (Approximate)   SpO2 97%   BMI 23.77 kg/m  Wt Readings from Last 3 Encounters:  12/09/20 138 lb 8 oz (62.8 kg)  12/09/20 137 lb (62.1 kg)  11/24/20 136 lb (61.7 kg)     Health Maintenance Due  Topic Date Due   Pneumococcal Vaccine 34-41 Years old (1 - PCV) Never done   HIV Screening  Never done    There are no preventive care reminders to display for this patient.  Lab Results  Component Value Date   TSH 1.69 10/28/2020   Lab Results  Component Value Date   WBC 5.2 10/28/2020    HGB 12.0 10/28/2020   HCT 36.3 10/28/2020   MCV 87.2 10/28/2020   PLT 344.0 10/28/2020   Lab Results  Component Value Date   NA 127 (L) 12/09/2020   K 4.8 12/09/2020   CO2 27 12/09/2020   GLUCOSE 69 (L) 12/09/2020   BUN 13 12/09/2020   CREATININE 0.63 12/09/2020   BILITOT 0.3 10/28/2020   ALKPHOS 55 10/28/2020   AST 16 10/28/2020   ALT 15 10/28/2020   PROT 7.2 10/30/2020   ALBUMIN 4.2 10/28/2020   CALCIUM 9.0 12/09/2020   ANIONGAP 12 09/26/2019   GFR 96.72 12/09/2020   Lab Results  Component Value Date   CHOL 180 10/30/2020   Lab Results  Component Value Date   HDL 75.40 10/30/2020   Lab Results  Component Value Date   LDLCALC 96 10/30/2020   Lab Results  Component Value Date   TRIG 39.0 10/30/2020   Lab Results  Component Value Date   CHOLHDL 2 10/30/2020   Lab Results  Component Value Date   HGBA1C 5.9 05/02/2017      Assessment & Plan:   #1 hyponatremia.  This is chronic.  Labs consistent with SIADH.  Patient requesting follow-up basic metabolic panel and this will be ordered.  She is not restricting sodium intake.  #2 history of recurrent depression.  Patient has had some increased depression symptoms and has concerns about whether gabapentin may be worsening.  She  plans to stop the gabapentin.  We did have a discussion regarding other possible medication options such as Cymbalta.  She is very reluctant because of prior intolerance with Effexor.  She has decided not to pursue looking at SNRI options at this time. If she has not seen improvement in her depression symptoms with stopping gabapentin will be in touch  #3 chronic anxiety symptoms.  We discussed tapering off clonazepam in the past but she had very stressful past year.  We agreed to refill her clonazepam 1 mg twice daily as needed.  She has no history of misuse.   Meds ordered this encounter  Medications   clonazePAM (KLONOPIN) 1 MG tablet    Sig: Take 1 tablet (1 mg total) by mouth 2 (two)  times daily.    Dispense:  60 tablet    Refill:  3    Follow-up: No follow-ups on file.    Carolann Littler, MD

## 2020-12-09 NOTE — Patient Instructions (Addendum)
Good to see you Stop the gabapentin lets see if that helps Talk to primary about increasing lexipro or see what they thing about Cymbalta Ok to stay active See rheumatology Turmeric 500mg  daily  Tart cherry extract 1200mg  at night Send me a message in 2-3 week See me again in 6-8 weeks

## 2020-12-09 NOTE — Telephone Encounter (Signed)
[provider] Okay to fill

## 2020-12-09 NOTE — Assessment & Plan Note (Signed)
Recurrent depression and patient is going to be following up with primary care provider.  Discussed with patient about discontinuing the gabapentin if she does think that this could potentially causing it.  Encourage patient to consider the possibility also of may be Cymbalta for more pain control as well.  Patient will discuss with primary care provider but hopefully patients will continue to work with her therapist as well.  Do feel once this seems to get better hopefully we can then concentrate more on the pain.

## 2020-12-09 NOTE — Assessment & Plan Note (Signed)
Patient does have sacroiliac dysfunction as well as pain.  Patient does have mild tightness noted of the area on the right hip.  Discussed with patient that this is still within the timeline that could be contributing to steroid at this moment of some of the discomfort and pain.  Encouraged her to continue to increase activity slowly.  We will can continue with physical therapy but listening to what activities potentially can cause some problems.  Patient will be following up with me again in 5 to 6 weeks.

## 2020-12-10 ENCOUNTER — Ambulatory Visit: Payer: BC Managed Care – PPO | Admitting: Physician Assistant

## 2020-12-10 ENCOUNTER — Other Ambulatory Visit: Payer: Self-pay | Admitting: Internal Medicine

## 2020-12-10 ENCOUNTER — Ambulatory Visit
Admission: RE | Admit: 2020-12-10 | Discharge: 2020-12-10 | Disposition: A | Discharge status: Home / Self Care | Payer: BC Managed Care – PPO | Source: Ambulatory Visit | Attending: Internal Medicine | Admitting: Internal Medicine

## 2020-12-10 DIAGNOSIS — Z87891 Personal history of nicotine dependence: Secondary | ICD-10-CM | POA: Diagnosis not present

## 2020-12-10 DIAGNOSIS — E222 Syndrome of inappropriate secretion of antidiuretic hormone: Secondary | ICD-10-CM

## 2020-12-10 DIAGNOSIS — Z122 Encounter for screening for malignant neoplasm of respiratory organs: Secondary | ICD-10-CM

## 2020-12-11 ENCOUNTER — Encounter: Payer: Self-pay | Admitting: Family Medicine

## 2020-12-12 DIAGNOSIS — F4312 Post-traumatic stress disorder, chronic: Secondary | ICD-10-CM | POA: Diagnosis not present

## 2020-12-12 NOTE — Telephone Encounter (Signed)
Please advise 

## 2020-12-17 DIAGNOSIS — N3010 Interstitial cystitis (chronic) without hematuria: Secondary | ICD-10-CM | POA: Diagnosis not present

## 2020-12-17 DIAGNOSIS — M62838 Other muscle spasm: Secondary | ICD-10-CM | POA: Diagnosis not present

## 2020-12-17 DIAGNOSIS — N942 Vaginismus: Secondary | ICD-10-CM | POA: Diagnosis not present

## 2020-12-19 NOTE — Progress Notes (Signed)
Office Visit Note  Patient: Gabriela Shepard             Date of Birth: 1960-07-22           MRN: [id number]             PCP: Eulas Post, MD Referring: Lyndal Pulley, DO Visit Date: 12/30/2020 Occupation: _0 @  Subjective:  Pain in multiple joints and muscles.   History of Present Illness: Gabriela Shepard is a 60 y.o. female seen in consultation per request of Dr. [name] for evaluation of positive ANA.  According the patient she has had knee joint pain for last 15 years.  She states in 2007 she used to play tennis and thus when she started noticing left knee joint discomfort.  She was advised arthroscopic surgery and then she eventually had left total knee replacement in 2017.  At the time she was also dealing with bilateral hip joint pain.  She had right hip joint labral tear for many years.  She underwent bilateral total hip replacement by Dr. Rhona Raider.  Left hip was replaced in 2018 and right in April 2022.  She states right hip replacement also helped her with the pelvic floor dysfunction which was recently diagnosed.  She also had neck pain for the last few years.  In 2019 she developed right-sided radiculopathy and was evaluated by Dr. Vertell Limber.  She underwent C-spine fusion.  She states that recently the right  neck pain and the right-sided radiculopathy is coming back.  She was evaluated by Dr. [name] for it recently.  She was also having discomfort in her bilateral hands.  She was diagnosed with CMC arthritis and had right CMC injection by Dr. [name] in February 2022 which helped.  She is having discomfort in her left CMC joint now.  She notices swelling over her CMC joints and occasionally in her knees.  She gives history of sicca symptoms.  She denies any history of oral ulcers, nasal ulcers, malar rash, Raynaud's phenomenon, lymphadenopathy or photosensitivity.  There is no family history of autoimmune disease.  She is gravida 2, para 2, miscarriages 0.  She has never had any  DVTs.  She states that she has had chronic SI joint pain and also right ischial bursitis.  She was diagnosed with interstitial cystitis January 2022.  She has been going to physical therapy with [facility].  She also has history of IBS for 20 years.  She states recently for the last few months she has been experiencing increased fatigue and sensation of bloating all over her body.  She goes for massage on a regular basis.  She states recently she has been experiencing increased sensitivity to touch.  She also gives history of insomnia for many years.  She has been under a lot of stress all of her life.  She used to play tennis but she had to quit playing tennis.  She swims occasionally.  She is to do gardening, hiking and biking which she has not been doing able to do recently.  She feels depressed due to her current situation.  She has been seeing a counselor on a regular basis.  Activities of Daily Living:  Patient reports morning stiffness for 1 hour.   Patient Reports nocturnal pain.  Difficulty dressing/grooming: Denies Difficulty climbing stairs: Denies Difficulty getting out of chair: Denies Difficulty using hands for taps, buttons, cutlery, and/or writing: Reports  Review of Systems  Constitutional:  Positive for fatigue.  HENT:  Positive for mouth dryness and nose dryness. Negative for mouth sores.   Eyes:  Positive for dryness. Negative for pain and itching.  Respiratory:  Positive for shortness of breath. Negative for difficulty breathing.   Cardiovascular:  Negative for chest pain and palpitations.  Gastrointestinal:  Positive for constipation. Negative for blood in stool and diarrhea.  Endocrine: Negative for increased urination.  Genitourinary:  Negative for difficulty urinating.  Musculoskeletal:  Positive for joint pain, joint pain, joint swelling, myalgias, morning stiffness, muscle tenderness and myalgias.  Skin:  Negative for color change, rash, redness and sensitivity to  sunlight.  Allergic/Immunologic: Negative for susceptible to infections.  Neurological:  Positive for dizziness, headaches and memory loss. Negative for numbness.  Hematological:  Positive for bruising/bleeding tendency. Negative for swollen glands.  Psychiatric/Behavioral:  Positive for depressed mood, confusion and sleep disturbance. The patient is nervous/anxious.    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   Piriformis syndrome, right 08/09/2018   Weight gain 02/08/2018   Trigger point of right shoulder region 12/22/2017   Cervical radiculopathy at C6 11/29/2017   Osteopenia 03/09/2016   Primary osteoarthritis of left knee 12/23/2015   IBS (irritable bowel syndrome) 02/19/2015   Postmenopausal 12/19/2013   Migraine headache 12/05/2012   Pain, upper back 09/24/2010   Depression, recurrent ([place]) 09/04/2010    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   History of UTI    Interstitial cystitis    Low sodium levels    Migraines    Osteoporosis    PONV (postoperative nausea and vomiting)    PTSD (post-traumatic stress disorder)    on meds   STD (sexually transmitted disease)    Substance abuse ([place])    hx of alcohol   Urine incontinence     Family History  Problem Relation Age of Onset   Hypertension Mother    Lung cancer Mother    Kidney cancer Father    Ulcers  Father    Heart disease Father    Drug abuse Sister    Alcoholism Brother    Hypertension Maternal Grandmother    Alcohol abuse Other    Arthritis Other    Hypertension Other    Mental illness Other    Healthy Daughter    Healthy Son    Colon cancer Neg Hx    Esophageal cancer Neg Hx    Colon polyps Neg Hx    Rectal cancer Neg Hx    Stomach cancer Neg Hx    Past Surgical History:  Procedure Laterality Date   BREAST BIOPSY Left    2017 benign x 2   CESAREAN SECTION  1992   COLONOSCOPY  2018   JMP-MAC-suprep(good)-polyps   INCONTINENCE SURGERY     LAPAROSCOPY     under bladder   NECK SURGERY  02/24/2018   ACDF   POLYPECTOMY  2018   polyps removed   TOTAL HIP ARTHROPLASTY Right 09/08/2020   TOTAL HIP ARTHROPLASTY Left 2018   TOTAL KNEE ARTHROPLASTY Left 12/23/2015   Procedure: TOTAL KNEE ARTHROPLASTY;  Surgeon: Melrose Nakayama, MD;  Location: [facility];  Service: Orthopedics;  Laterality: Left;   Social History   Social History Narrative   Not on file   Immunization History  Administered Date(s) Administered   Influenza Inj Mdck Quad Pf 04/01/2017   Influenza Inj Mdck Quad With Preservative 05/22/2018   Influenza Split 05/12/2011   Influenza,inj,Quad PF,6+ Mos 06/10/2014, 02/19/2015, 03/09/2016, 02/28/2019, 05/16/2020   Tdap 04/30/2019   Zoster Recombinat (Shingrix) 11/28/2020   Zoster, Live 02/19/2015     Objective: Vital Signs: BP 121/85 (BP Location: Right Arm, Patient Position: Sitting, Cuff Size: Normal)   Pulse 73   Ht 5' 4.25" (1.632 m)   Wt 139 lb 6.4 oz (63.2 kg)   LMP 06/07/2010 (Approximate)   BMI 23.74 kg/m    Physical Exam Vitals and nursing note reviewed.  Constitutional:      Appearance: She is well-developed.  HENT:     Head: Normocephalic and atraumatic.  Eyes:     Conjunctiva/sclera: Conjunctivae normal.  Cardiovascular:     Rate and Rhythm: Normal rate and regular rhythm.     Heart sounds: Normal heart sounds.  Pulmonary:     Effort:  Pulmonary effort is normal.     Breath sounds: Normal breath sounds.  Abdominal:     General: Bowel sounds are normal.     Palpations: Abdomen is soft.  Musculoskeletal:     Cervical back: Normal range of motion.  Lymphadenopathy:     Cervical: No cervical adenopathy.  Skin:    General: Skin is warm and dry.     Capillary Refill: Capillary refill takes less than 2 seconds.  Neurological:     Mental Status: She is alert and oriented to person, place, and time.  Psychiatric:        Behavior: Behavior normal.     Musculoskeletal Exam: She has some limitation of C-spine.  She has surgical scar from previous C-spine surgery.  Lumbar spine was in good range of motion.  She had tenderness over right SI joint.  She also had tenderness over bilateral piriformis region and trochanteric area.  She had hyperalgesia.  Shoulder joints and elbow joints with good range of motion.  She had bilateral CMC and first PIP thickening consistent with osteoarthritis.  No synovitis was noted.  Hip joints were replaced and were in good range of motion.  Left knee joint is replaced without any warmth swelling or effusion.  Right knee joint was in good range of motion.  There was no tenderness over ankles or MTPs.  CDAI Exam: CDAI Score: -- Patient Global: --; Provider Global: -- Swollen: --; Tender: -- Joint Exam 12/30/2020   No joint exam has been documented for this visit   There is currently no information documented on the homunculus. Go to the Rheumatology activity and complete the homunculus joint exam.  Investigation: No additional findings.  Imaging: CT CHEST LUNG CANCER SCREENING LOW DOSE WO CONTRAST  Result Date: 12/11/2020 CLINICAL DATA:  60 year old asymptomatic female former smoker with 30 pack-year smoking history, quit smoking 10 years prior. EXAM: CT CHEST WITHOUT CONTRAST LOW-DOSE FOR LUNG CANCER SCREENING TECHNIQUE: Multidetector CT imaging of the chest was performed following the standard  protocol without IV contrast. COMPARISON:  02/27/2018 chest CT angiogram.  FINDINGS: Cardiovascular: Normal heart size. No significant pericardial effusion/thickening. Atherosclerotic nonaneurysmal thoracic aorta. Top-normal caliber main pulmonary artery (3.0 cm diameter). Mediastinum/Nodes: Isodense 3.0 cm left thyroid nodule. This has been evaluated on previous imaging (12/05/2020 thyroid ultrasound). (Ref: J Am Coll Radiol. 2015 Feb;12(2): 143-50). Unremarkable esophagus. No pathologically enlarged axillary, mediastinal or hilar lymph nodes, noting limited sensitivity for the detection of hilar adenopathy on this noncontrast study. Lungs/Pleura: No pneumothorax. No pleural effusion. Mild centrilobular emphysema with mild diffuse bronchial wall thickening. No acute consolidative airspace disease or lung masses. Indistinct solid peripheral left upper lobe nodule measuring 6.4 mm in volume derived mean diameter (series 3/image 65). Indistinct ground-glass nodule in the posterior aspect of the superior segment right lower lobe measuring 11.0 mm in volume derived mean diameter (series 3/image 113). Upper abdomen: No acute abnormality. Musculoskeletal: No aggressive appearing focal osseous lesions. Mild thoracic spondylosis. Partially visualized surgical hardware from ACDF. IMPRESSION: 1. Lung-RADS 3, probably benign findings. Short-term follow-up in 6 months is recommended with repeat low-dose chest CT without contrast (please use the following order, "CT CHEST LCS NODULE FOLLOW-UP W/O CM"). Indistinct solid 6.4 mm left upper lobe pulmonary nodule. 2. Aortic Atherosclerosis (ICD10-I70.0) and Emphysema (ICD10-J43.9). Electronically Signed   By: Ilona Sorrel M.D.   On: 12/11/2020 15:58   US THYROID  Result Date: 12/05/2020 CLINICAL DATA:  Other. 60 year old female with history of left thyroid nodule, status post fine-needle aspiration on 08/15/2019. Concern for nodule enlargement. EXAM: THYROID ULTRASOUND TECHNIQUE:  Ultrasound examination of the thyroid gland and adjacent soft tissues was performed. COMPARISON:  08/10/2019 FINDINGS: Parenchymal Echotexture: Mildly heterogenous Isthmus: 0.2 cm, previously 0.3 cm Right lobe: 4.3 x 1.1 x 1.6 cm, previously 4.5 x 1.5 x 1.6 cm Left lobe: 5.0 x 1.9 x 2.8 cm, previously 4.7 x 1.9 x 2.7 cm _________________________________________________________ Estimated total number of nodules >/= 1 cm: 1 Number of spongiform nodules >/=  2 cm not described below (TR1): 0 Number of mixed cystic and solid nodules >/= 1.5 cm not described below (TR2): 0 _________________________________________________________ Unchanged appearance of previously visualized multifocal right mid solid thyroid nodules, both which measure up to 0.7 cm and again appear benign not warranting additional follow-up. Nodule # 3: Prior biopsy: Yes, 08/15/2019 Location: Left; Mid Maximum size: 3.3 cm; Other 2 dimensions: 2.4 x 1.9 cm, previously, 2.9 x 2.1 x 1.8 cm Composition: spongiform (0) Echogenicity: isoechoic (1) Shape: not taller-than-wide (0) Margins: smooth (0) Echogenic foci: none (0) ACR TI-RADS total points: 1. ACR TI-RADS risk category:  TR1 (0-1 points). Significant change in size (>/= 20% in two dimensions and minimal increase of 2 mm): No Change in features: No Change in ACR TI-RADS risk category: No ACR TI-RADS recommendations: This nodule does NOT meet TI-RADS criteria for biopsy or dedicated follow-up. Recommend correlation with prior biopsy results. _________________________________________________________ IMPRESSION: Minimal interval enlargement of the previously visualized and biopsied left mid thyroid nodule (labeled 3, 3.3 cm, previously 2.9 cm). Ruthann Cancer, MD [facility] Electronically Signed   By: Ruthann Cancer MD   On: 12/05/2020 16:12    Recent Labs: Lab Results  Component Value Date   WBC 5.2 10/28/2020   HGB 12.0 10/28/2020   PLT  344.0 10/28/2020   NA 127 (L) 12/09/2020   K 4.8 12/09/2020   CL 94 (L) 12/09/2020   CO2 27 12/09/2020   GLUCOSE 69 (L) 12/09/2020   BUN 13 12/09/2020   CREATININE 0.63 12/09/2020   BILITOT 0.3 10/28/2020  ALKPHOS 55 10/28/2020   AST 16 10/28/2020   ALT 15 10/28/2020   PROT 7.2 10/30/2020   ALBUMIN 4.2 10/28/2020   CALCIUM 9.0 12/09/2020   GFRAA >60 09/26/2019    Speciality Comments: No specialty comments available.  Procedures:  No procedures performed Allergies: Sulfa antibiotics   Assessment / Plan:     Visit Diagnoses: Positive ANA (antinuclear antibody) - 10/28/20: ANA 1:320 cytoplasmic, Ace 41, RF-, Anti-CCP-, CRP<1, ESR 25, TSH 1.69, uric acid 3.0, vitamin D 46.56, PTH 37, iron 50, transferrin 285.  She has positive ANA.  There is no family history of autoimmune disease.  She denies any history of oral ulcers, nasal ulcers, malar rash, photosensitivity, Raynaud's phenomenon or lymphadenopathy.  She gives history of sicca symptoms which may be related to the medication use.  I will obtain AVISE labs to complete the work-up.  Primary osteoarthritis of both hands-she complains of discomfort in her bilateral hands especially the CMC joints.  She had right CMC injection by Dr. [name] in February which was helpful.  Joint protection muscle strengthening was discussed.  A handout on hand exercises was given.  Piriformis syndrome, right-she had some discomfort over her piriformis region.  Sacroiliac joint disease-she complains of chronic SI joint pain.  She had good mobility in her lumbar spine without any discomfort.  Status post total knee replacement, left - 2017 by Dr. Rhona Raider.  She had good results from knee joint replacement.  A handout on lower extremities muscle strengthening exercises was given.  History of total hip replacement, bilateral - [facility] -09/2020, [facility] 2018 by Dr. Rhona Raider.  She had a labral tear in the right hip in the past.  She had bilateral total hip replacement  for osteoarthritis.  DDD (degenerative disc disease), cervical - Status post C-spine fusion 2019 by Dr. Vertell Limber.  She states that the C-spine pain has come back and she has been having right-sided radiculopathy.  She had recent evaluation by Dr. [name].  Age-related osteoporosis without current pathological fracture - November 05, 2020 DEXA scan showed T score -2.4RFN.  She wanted me to review her DEXA scan results.  I reviewed her DEXA scan results which are consistent with osteopenia.  Use of calcium rich diet and vitamin D was discussed.  She may benefit from the use of Fosamax.  She states she needs some dental work.  I have advised her to schedule an appoint with the dentist and finish dental work before starting on Fosamax.  Myofascial pain-she has generalized pain, hyperalgesia and positive tender points.  Detailed counseling guarding fibromyalgia syndrome was provided.  Need for regular exercise was emphasized.  Need for regular exercise especially water aerobics and swimming was emphasized.  Other fatigue-she gives history of increased fatigue most like related to insomnia.  Primary insomnia-she has history of chronic insomnia for many years.  Good sleep hygiene was discussed.  Interstitial cystitis-it is not uncommon to see interstitial cystitis, IBS and migraines with fibromyalgia.  She is going for physical therapy at [facility].  History of IBS-she gives history of IBS for many years.  Hx of migraines-she is on treatment.  History of depression-she was tearful during the conversation.  She has been seeing a Social worker.  That she is on medications.  She gives history of depression and anxiety.  She is also seeing a Social worker.  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  Multiple thyroid nodules  Orders: No orders of the defined types were placed  in this encounter.  No orders of the defined types were placed in this encounter.    Follow-Up Instructions: Return  for Positive ANA, osteoarthritis.   Bo Merino, MD  Note - This record has been created using Editor, commissioning.  Chart creation errors have been sought, but may not always  have been located. Such creation errors do not reflect on  the standard of medical care.

## 2020-12-22 DIAGNOSIS — F4312 Post-traumatic stress disorder, chronic: Secondary | ICD-10-CM | POA: Diagnosis not present

## 2020-12-23 DIAGNOSIS — L308 Other specified dermatitis: Secondary | ICD-10-CM | POA: Diagnosis not present

## 2020-12-23 DIAGNOSIS — L821 Other seborrheic keratosis: Secondary | ICD-10-CM | POA: Diagnosis not present

## 2020-12-23 DIAGNOSIS — E041 Nontoxic single thyroid nodule: Secondary | ICD-10-CM | POA: Insufficient documentation

## 2020-12-23 DIAGNOSIS — D1801 Hemangioma of skin and subcutaneous tissue: Secondary | ICD-10-CM | POA: Diagnosis not present

## 2020-12-23 DIAGNOSIS — R0989 Other specified symptoms and signs involving the circulatory and respiratory systems: Secondary | ICD-10-CM | POA: Diagnosis not present

## 2020-12-24 DIAGNOSIS — M62838 Other muscle spasm: Secondary | ICD-10-CM | POA: Diagnosis not present

## 2020-12-24 DIAGNOSIS — R102 Pelvic and perineal pain: Secondary | ICD-10-CM | POA: Diagnosis not present

## 2020-12-24 DIAGNOSIS — R2681 Unsteadiness on feet: Secondary | ICD-10-CM | POA: Diagnosis not present

## 2020-12-24 DIAGNOSIS — M25551 Pain in right hip: Secondary | ICD-10-CM | POA: Diagnosis not present

## 2020-12-26 DIAGNOSIS — M545 Low back pain, unspecified: Secondary | ICD-10-CM | POA: Diagnosis not present

## 2020-12-30 ENCOUNTER — Ambulatory Visit (INDEPENDENT_AMBULATORY_CARE_PROVIDER_SITE_OTHER): Payer: BC Managed Care – PPO | Admitting: Rheumatology

## 2020-12-30 ENCOUNTER — Other Ambulatory Visit: Payer: Self-pay

## 2020-12-30 ENCOUNTER — Encounter: Payer: Self-pay | Admitting: Rheumatology

## 2020-12-30 VITALS — BP 121/85 | HR 73 | Ht 64.25 in | Wt 139.4 lb

## 2020-12-30 DIAGNOSIS — Z96652 Presence of left artificial knee joint: Secondary | ICD-10-CM

## 2020-12-30 DIAGNOSIS — Z8659 Personal history of other mental and behavioral disorders: Secondary | ICD-10-CM

## 2020-12-30 DIAGNOSIS — R5383 Other fatigue: Secondary | ICD-10-CM

## 2020-12-30 DIAGNOSIS — M533 Sacrococcygeal disorders, not elsewhere classified: Secondary | ICD-10-CM | POA: Diagnosis not present

## 2020-12-30 DIAGNOSIS — M19041 Primary osteoarthritis, right hand: Secondary | ICD-10-CM | POA: Diagnosis not present

## 2020-12-30 DIAGNOSIS — M7918 Myalgia, other site: Secondary | ICD-10-CM

## 2020-12-30 DIAGNOSIS — F5101 Primary insomnia: Secondary | ICD-10-CM

## 2020-12-30 DIAGNOSIS — M1712 Unilateral primary osteoarthritis, left knee: Secondary | ICD-10-CM

## 2020-12-30 DIAGNOSIS — M19042 Primary osteoarthritis, left hand: Secondary | ICD-10-CM

## 2020-12-30 DIAGNOSIS — R7689 Other specified abnormal immunological findings in serum: Secondary | ICD-10-CM

## 2020-12-30 DIAGNOSIS — R768 Other specified abnormal immunological findings in serum: Secondary | ICD-10-CM

## 2020-12-30 DIAGNOSIS — Z8719 Personal history of other diseases of the digestive system: Secondary | ICD-10-CM

## 2020-12-30 DIAGNOSIS — M5412 Radiculopathy, cervical region: Secondary | ICD-10-CM

## 2020-12-30 DIAGNOSIS — M810 Age-related osteoporosis without current pathological fracture: Secondary | ICD-10-CM

## 2020-12-30 DIAGNOSIS — G5701 Lesion of sciatic nerve, right lower limb: Secondary | ICD-10-CM

## 2020-12-30 DIAGNOSIS — N3010 Interstitial cystitis (chronic) without hematuria: Secondary | ICD-10-CM

## 2020-12-30 DIAGNOSIS — M5030 Other cervical disc degeneration, unspecified cervical region: Secondary | ICD-10-CM

## 2020-12-30 DIAGNOSIS — E042 Nontoxic multinodular goiter: Secondary | ICD-10-CM

## 2020-12-30 DIAGNOSIS — Z96643 Presence of artificial hip joint, bilateral: Secondary | ICD-10-CM

## 2020-12-30 DIAGNOSIS — Z8669 Personal history of other diseases of the nervous system and sense organs: Secondary | ICD-10-CM

## 2020-12-30 NOTE — Patient Instructions (Signed)
Journal for Nurse Practitioners, 15(4), 263-267. Retrieved March 13, 2018 from [email or web address]">  Knee Exercises Ask your health care provider which exercises are safe for you. Do exercises exactly as told by your health care provider and adjust them as directed. It is normal to feel mild stretching, pulling, tightness, or discomfort as you do these exercises. Stop right away if you feel sudden pain or your pain gets worse. Do not begin these exercises until told by your health care provider. Stretching and range-of-motion exercises These exercises warm up your muscles and joints and improve the movement and flexibility of your knee. These exercises also help to relieve pain andswelling. Knee extension, prone Lie on your abdomen (prone position) on a bed. Place your left / right knee just beyond the edge of the surface so your knee is not on the bed. You can put a towel under your left / right thigh just above your kneecap for comfort. Relax your leg muscles and allow gravity to straighten your knee (extension). You should feel a stretch behind your left / right knee. Hold this position for __________ seconds. Scoot up so your knee is supported between repetitions. Repeat __________ times. Complete this exercise __________ times a day. Knee flexion, active  Lie on your back with both legs straight. If this causes back discomfort, bend your left / right knee so your foot is flat on the floor. Slowly slide your left / right heel back toward your buttocks. Stop when you feel a gentle stretch in the front of your knee or thigh (flexion). Hold this position for __________ seconds. Slowly slide your left / right heel back to the starting position. Repeat __________ times. Complete this exercise __________ times a day. Quadriceps stretch, prone  Lie on your abdomen on a firm surface, such as a bed or padded floor. Bend your left / right knee and hold your ankle. If you cannot reach  your ankle or pant leg, loop a belt around your foot and grab the belt instead. Gently pull your heel toward your buttocks. Your knee should not slide out to the side. You should feel a stretch in the front of your thigh and knee (quadriceps). Hold this position for __________ seconds. Repeat __________ times. Complete this exercise __________ times a day. Hamstring, supine Lie on your back (supine position). Loop a belt or towel over the ball of your left / right foot. The ball of your foot is on the walking surface, right under your toes. Straighten your left / right knee and slowly pull on the belt to raise your leg until you feel a gentle stretch behind your knee (hamstring). Do not let your knee bend while you do this. Keep your other leg flat on the floor. Hold this position for __________ seconds. Repeat __________ times. Complete this exercise __________ times a day. Strengthening exercises These exercises build strength and endurance in your knee. Endurance is theability to use your muscles for a long time, even after they get tired. Quadriceps, isometric This exercise stretches the muscles in front of your thigh (quadriceps) without moving your knee joint (isometric). Lie on your back with your left / right leg extended and your other knee bent. Put a rolled towel or small pillow under your knee if told by your health care provider. Slowly tense the muscles in the front of your left / right thigh. You should see your kneecap slide up toward your hip or see increased dimpling just above the knee. This motion will   push the back of the knee toward the floor. For __________ seconds, hold the muscle as tight as you can without increasing your pain. Relax the muscles slowly and completely. Repeat __________ times. Complete this exercise __________ times a day. Straight leg raises This exercise stretches the muscles in front of your thigh (quadriceps) and the muscles that move your hips (hip  flexors). Lie on your back with your left / right leg extended and your other knee bent. Tense the muscles in the front of your left / right thigh. You should see your kneecap slide up or see increased dimpling just above the knee. Your thigh may even shake a bit. Keep these muscles tight as you raise your leg 4-6 inches (10-15 cm) off the floor. Do not let your knee bend. Hold this position for __________ seconds. Keep these muscles tense as you lower your leg. Relax your muscles slowly and completely after each repetition. Repeat __________ times. Complete this exercise __________ times a day. Hamstring, isometric Lie on your back on a firm surface. Bend your left / right knee about __________ degrees. Dig your left / right heel into the surface as if you are trying to pull it toward your buttocks. Tighten the muscles in the back of your thighs (hamstring) to "dig" as hard as you can without increasing any pain. Hold this position for __________ seconds. Release the tension gradually and allow your muscles to relax completely for __________ seconds after each repetition. Repeat __________ times. Complete this exercise __________ times a day. Hamstring curls If told by your health care provider, do this exercise while wearing ankle weights. Begin with __________ lb weights. Then increase the weight by 1 lb (0.5 kg) increments. Do not wear ankle weights that are more than __________ lb. Lie on your abdomen with your legs straight. Bend your left / right knee as far as you can without feeling pain. Keep your hips flat against the floor. Hold this position for __________ seconds. Slowly lower your leg to the starting position. Repeat __________ times. Complete this exercise __________ times a day. Squats This exercise strengthens the muscles in front of your thigh and knee (quadriceps). Stand in front of a table, with your feet and knees pointing straight ahead. You may rest your hands on the  table for balance but not for support. Slowly bend your knees and lower your hips like you are going to sit in a chair. Keep your weight over your heels, not over your toes. Keep your lower legs upright so they are parallel with the table legs. Do not let your hips go lower than your knees. Do not bend lower than told by your health care provider. If your knee pain increases, do not bend as low. Hold the squat position for __________ seconds. Slowly push with your legs to return to standing. Do not use your hands to pull yourself to standing. Repeat __________ times. Complete this exercise __________ times a day. Wall slides This exercise strengthens the muscles in front of your thigh and knee (quadriceps). Lean your back against a smooth wall or door, and walk your feet out 18-24 inches (46-61 cm) from it. Place your feet hip-width apart. Slowly slide down the wall or door until your knees bend __________ degrees. Keep your knees over your heels, not over your toes. Keep your knees in line with your hips. Hold this position for __________ seconds. Repeat __________ times. Complete this exercise __________ times a day. Straight leg raises This exercise   strengthens the muscles that rotate the leg at the hip and move it away from your body (hip abductors). Lie on your side with your left / right leg in the top position. Lie so your head, shoulder, knee, and hip line up. You may bend your bottom knee to help you keep your balance. Roll your hips slightly forward so your hips are stacked directly over each other and your left / right knee is facing forward. Leading with your heel, lift your top leg 4-6 inches (10-15 cm). You should feel the muscles in your outer hip lifting. Do not let your foot drift forward. Do not let your knee roll toward the ceiling. Hold this position for __________ seconds. Slowly return your leg to the starting position. Let your muscles relax completely after each  repetition. Repeat __________ times. Complete this exercise __________ times a day. Straight leg raises This exercise stretches the muscles that move your hips away from the front of the pelvis (hip extensors). Lie on your abdomen on a firm surface. You can put a pillow under your hips if that is more comfortable. Tense the muscles in your buttocks and lift your left / right leg about 4-6 inches (10-15 cm). Keep your knee straight as you lift your leg. Hold this position for __________ seconds. Slowly lower your leg to the starting position. Let your leg relax completely after each repetition. Repeat __________ times. Complete this exercise __________ times a day. This information is not intended to replace advice given to you by your health care provider. Make sure you discuss any questions you have with your healthcare provider. Document Revised: 03/14/2018 Document Reviewed: 03/14/2018 [employer] Patient Education  2022 [employer] Hand Exercises Hand exercises can be helpful for almost anyone. These exercises can strengthen the hands, improve flexibility and movement, and increase blood flow to the hands. These results can make work and daily tasks easier. Hand exercises can be especially helpful for people who have joint pain from arthritis or have nerve damage from overuse (carpal tunnel syndrome). These exercises can also help people who have injured a hand. Exercises Most of these hand exercises are gentle stretching and motion exercises. It is usually safe to do them often throughout the day. Warming up your hands before exercise may help to reduce stiffness. You can do this with gentle massage orby placing your hands in warm water for 10-15 minutes. It is normal to feel some stretching, pulling, tightness, or mild discomfort as you begin new exercises. This will gradually improve. Stop an exercise right away if you feel sudden, severe pain or your pain gets worse. Ask your healthcare  provider which exercises are best for you. Knuckle bend or "claw" fist Stand or sit with your arm, hand, and all five fingers pointed straight up. Make sure to keep your wrist straight during the exercise. Gently bend your fingers down toward your palm until the tips of your fingers are touching the top of your palm. Keep your big knuckle straight and just bend the small knuckles in your fingers. Hold this position for __________ seconds. Straighten (extend) your fingers back to the starting position. Repeat this exercise 5-10 times with each hand. Full finger fist Stand or sit with your arm, hand, and all five fingers pointed straight up. Make sure to keep your wrist straight during the exercise. Gently bend your fingers into your palm until the tips of your fingers are touching the middle of your palm. Hold this position for __________ seconds.   Extend your fingers back to the starting position, stretching every joint fully. Repeat this exercise 5-10 times with each hand. Straight fist Stand or sit with your arm, hand, and all five fingers pointed straight up. Make sure to keep your wrist straight during the exercise. Gently bend your fingers at the big knuckle, where your fingers meet your hand, and the middle knuckle. Keep the knuckle at the tips of your fingers straight and try to touch the bottom of your palm. Hold this position for __________ seconds. Extend your fingers back to the starting position, stretching every joint fully. Repeat this exercise 5-10 times with each hand. Tabletop Stand or sit with your arm, hand, and all five fingers pointed straight up. Make sure to keep your wrist straight during the exercise. Gently bend your fingers at the big knuckle, where your fingers meet your hand, as far down as you can while keeping the small knuckles in your fingers straight. Think of forming a tabletop with your fingers. Hold this position for __________ seconds. Extend your fingers  back to the starting position, stretching every joint fully. Repeat this exercise 5-10 times with each hand. Finger spread Place your hand flat on a table with your palm facing down. Make sure your wrist stays straight as you do this exercise. Spread your fingers and thumb apart from each other as far as you can until you feel a gentle stretch. Hold this position for __________ seconds. Bring your fingers and thumb tight together again. Hold this position for __________ seconds. Repeat this exercise 5-10 times with each hand. Making circles Stand or sit with your arm, hand, and all five fingers pointed straight up. Make sure to keep your wrist straight during the exercise. Make a circle by touching the tip of your thumb to the tip of your index finger. Hold for __________ seconds. Then open your hand wide. Repeat this motion with your thumb and each finger on your hand. Repeat this exercise 5-10 times with each hand. Thumb motion Sit with your forearm resting on a table and your wrist straight. Your thumb should be facing up toward the ceiling. Keep your fingers relaxed as you move your thumb. Lift your thumb up as high as you can toward the ceiling. Hold for __________ seconds. Bend your thumb across your palm as far as you can, reaching the tip of your thumb for the small finger (pinkie) side of your palm. Hold for __________ seconds. Repeat this exercise 5-10 times with each hand. Grip strengthening  Hold a stress ball or other soft ball in the middle of your hand. Slowly increase the pressure, squeezing the ball as much as you can without causing pain. Think of bringing the tips of your fingers into the middle of your palm. All of your finger joints should bend when doing this exercise. Hold your squeeze for __________ seconds, then relax. Repeat this exercise 5-10 times with each hand. Contact a health care provider if: Your hand pain or discomfort gets much worse when you do an  exercise. Your hand pain or discomfort does not improve within 2 hours after you exercise. If you have any of these problems, stop doing these exercises right away. Do not do them again unless your health care provider says that you can. Get help right away if: You develop sudden, severe hand pain or swelling. If this happens, stop doing these exercises right away. Do not do them again unless your health care provider says that you can. This   information is not intended to replace advice given to you by your health care provider. Make sure you discuss any questions you have with your healthcare provider. Document Revised: 09/14/2018 Document Reviewed: 05/25/2018 [employer] Patient Education  2022 [employer]  

## 2020-12-31 ENCOUNTER — Ambulatory Visit: Payer: BC Managed Care – PPO

## 2021-01-01 ENCOUNTER — Ambulatory Visit: Payer: BC Managed Care – PPO | Admitting: Physician Assistant

## 2021-01-05 ENCOUNTER — Other Ambulatory Visit: Payer: Self-pay

## 2021-01-05 ENCOUNTER — Ambulatory Visit
Admission: RE | Admit: 2021-01-05 | Discharge: 2021-01-05 | Disposition: A | Discharge status: Home / Self Care | Payer: BC Managed Care – PPO | Source: Ambulatory Visit | Attending: Family Medicine | Admitting: Family Medicine

## 2021-01-05 DIAGNOSIS — Z1231 Encounter for screening mammogram for malignant neoplasm of breast: Secondary | ICD-10-CM

## 2021-01-06 DIAGNOSIS — F332 Major depressive disorder, recurrent severe without psychotic features: Secondary | ICD-10-CM | POA: Diagnosis not present

## 2021-01-06 DIAGNOSIS — R768 Other specified abnormal immunological findings in serum: Secondary | ICD-10-CM | POA: Diagnosis not present

## 2021-01-07 DIAGNOSIS — F332 Major depressive disorder, recurrent severe without psychotic features: Secondary | ICD-10-CM | POA: Diagnosis not present

## 2021-01-08 DIAGNOSIS — E871 Hypo-osmolality and hyponatremia: Secondary | ICD-10-CM | POA: Diagnosis not present

## 2021-01-08 DIAGNOSIS — E041 Nontoxic single thyroid nodule: Secondary | ICD-10-CM | POA: Diagnosis not present

## 2021-01-08 DIAGNOSIS — R682 Dry mouth, unspecified: Secondary | ICD-10-CM | POA: Diagnosis not present

## 2021-01-08 DIAGNOSIS — M810 Age-related osteoporosis without current pathological fracture: Secondary | ICD-10-CM | POA: Diagnosis not present

## 2021-01-08 DIAGNOSIS — R7301 Impaired fasting glucose: Secondary | ICD-10-CM | POA: Diagnosis not present

## 2021-01-08 DIAGNOSIS — F332 Major depressive disorder, recurrent severe without psychotic features: Secondary | ICD-10-CM | POA: Diagnosis not present

## 2021-01-08 DIAGNOSIS — E559 Vitamin D deficiency, unspecified: Secondary | ICD-10-CM | POA: Diagnosis not present

## 2021-01-09 DIAGNOSIS — F332 Major depressive disorder, recurrent severe without psychotic features: Secondary | ICD-10-CM | POA: Diagnosis not present

## 2021-01-12 DIAGNOSIS — F4312 Post-traumatic stress disorder, chronic: Secondary | ICD-10-CM | POA: Diagnosis not present

## 2021-01-12 DIAGNOSIS — F332 Major depressive disorder, recurrent severe without psychotic features: Secondary | ICD-10-CM | POA: Diagnosis not present

## 2021-01-13 DIAGNOSIS — F332 Major depressive disorder, recurrent severe without psychotic features: Secondary | ICD-10-CM | POA: Diagnosis not present

## 2021-01-14 DIAGNOSIS — F332 Major depressive disorder, recurrent severe without psychotic features: Secondary | ICD-10-CM | POA: Diagnosis not present

## 2021-01-14 DIAGNOSIS — M545 Low back pain, unspecified: Secondary | ICD-10-CM | POA: Diagnosis not present

## 2021-01-15 ENCOUNTER — Encounter: Payer: Self-pay | Admitting: Obstetrics and Gynecology

## 2021-01-15 DIAGNOSIS — F332 Major depressive disorder, recurrent severe without psychotic features: Secondary | ICD-10-CM | POA: Diagnosis not present

## 2021-01-16 DIAGNOSIS — M47816 Spondylosis without myelopathy or radiculopathy, lumbar region: Secondary | ICD-10-CM | POA: Diagnosis not present

## 2021-01-16 DIAGNOSIS — F332 Major depressive disorder, recurrent severe without psychotic features: Secondary | ICD-10-CM | POA: Diagnosis not present

## 2021-01-17 ENCOUNTER — Other Ambulatory Visit: Payer: Self-pay | Admitting: Family Medicine

## 2021-01-18 ENCOUNTER — Other Ambulatory Visit: Payer: Self-pay | Admitting: Obstetrics and Gynecology

## 2021-01-18 DIAGNOSIS — N952 Postmenopausal atrophic vaginitis: Secondary | ICD-10-CM

## 2021-01-18 MED ORDER — ESTRADIOL 10 MCG VA TABS: 1.0000 | ORAL_TABLET | VAGINAL | 10 refills | Status: DC

## 2021-01-18 NOTE — Progress Notes (Signed)
Office Visit Note  Patient: Gabriela Shepard             Date of Birth: Apr 04, 1961           MRN: [id number]             PCP: Eulas Post, MD Referring: Eulas Post, MD Visit Date: 01/29/2021 Occupation: _0 @  Subjective:  Pain in joints and muscles.   History of Present Illness: Gabriela Shepard is a 60 y.o. female with history of positive ANA, osteoarthritis and myofascial pain.  She states she has been doing regular exercise and stretching which has been helpful.  She still notices hyperalgesia and generalized pain.  She started trazodone which is helping her sleep through the night now.  She has noticed some improvement in fatigue.  She started Fosamax after dental work.  Activities of Daily Living:  Patient reports morning stiffness for 1 hour.   Patient Reports nocturnal pain.  Difficulty dressing/grooming: Denies Difficulty climbing stairs: Denies Difficulty getting out of chair: Denies Difficulty using hands for taps, buttons, cutlery, and/or writing: Reports  Review of Systems  Constitutional:  Positive for fatigue.  HENT:  Positive for mouth sores and mouth dryness. Negative for nose dryness.   Eyes:  Positive for dryness. Negative for pain and itching.  Respiratory:  Positive for shortness of breath and difficulty breathing.   Cardiovascular:  Negative for chest pain and palpitations.  Gastrointestinal:  Positive for constipation. Negative for blood in stool and diarrhea.  Endocrine: Negative for increased urination.  Genitourinary:  Negative for difficulty urinating.  Musculoskeletal:  Positive for joint pain, joint pain, joint swelling, myalgias, morning stiffness, muscle tenderness and myalgias.  Skin:  Negative for color change, rash and redness.  Allergic/Immunologic: Negative for susceptible to infections.  Neurological:  Positive for dizziness, headaches and weakness. Negative for numbness and memory loss.  Hematological:  Positive for  bruising/bleeding tendency.  Psychiatric/Behavioral:  Negative for confusion.    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   Piriformis syndrome, right 08/09/2018   Weight gain 02/08/2018   Trigger point of right shoulder region 12/22/2017   Cervical radiculopathy at C6 11/29/2017   Osteopenia 03/09/2016   Primary osteoarthritis of left knee 12/23/2015   IBS (irritable bowel syndrome) 02/19/2015   Postmenopausal 12/19/2013   Migraine headache 12/05/2012   Pain, upper back 09/24/2010   Depression, recurrent ([place]) 09/04/2010    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   History of UTI    Interstitial cystitis    Low sodium levels    Migraines    Osteoporosis    PONV (postoperative nausea and vomiting)    PTSD (post-traumatic stress disorder)    on meds   STD (sexually transmitted disease)    Substance abuse ([place])  hx of alcohol   Urine incontinence     Family History  Problem Relation Age of Onset   Hypertension Mother    Lung cancer Mother    Kidney cancer Father    Ulcers Father    Heart disease Father    Drug abuse Sister    Alcoholism Brother    Hypertension Maternal Grandmother    Alcohol abuse Other    Arthritis Other    Hypertension Other    Mental illness Other    Healthy Daughter    Healthy Son    Colon cancer Neg Hx    Esophageal cancer Neg  Hx    Colon polyps Neg Hx    Rectal cancer Neg Hx    Stomach cancer Neg Hx    Past Surgical History:  Procedure Laterality Date   BREAST BIOPSY Left    2017 benign x 2   CESAREAN SECTION  1992   COLONOSCOPY  2018   JMP-MAC-suprep(good)-polyps   INCONTINENCE SURGERY     LAPAROSCOPY     under bladder   NECK SURGERY  02/24/2018   ACDF   POLYPECTOMY  2018   polyps removed   TOTAL HIP ARTHROPLASTY Right 09/08/2020   TOTAL HIP ARTHROPLASTY Left 2018   TOTAL KNEE ARTHROPLASTY Left 12/23/2015   Procedure: TOTAL KNEE ARTHROPLASTY;  Surgeon: Melrose Nakayama, MD;  Location: [facility];  Service: Orthopedics;  Laterality: Left;   Social History   Social History Narrative   Not on file   Immunization History  Administered Date(s) Administered   Influenza Inj Mdck Quad Pf 04/01/2017   Influenza Inj Mdck Quad With Preservative 05/22/2018   Influenza Split 05/12/2011   Influenza,inj,Quad PF,6+ Mos 06/10/2014, 02/19/2015, 03/09/2016, 02/28/2019, 05/16/2020   Tdap 04/30/2019   Zoster Recombinat (Shingrix) 11/28/2020   Zoster, Live 02/19/2015     Objective: Vital Signs: BP 127/88 (BP Location: Right Arm, Patient Position: Sitting, Cuff Size: Normal)   Pulse (!) 58   Ht _0  (1.626 m)   Wt 142 lb 3.2 oz (64.5 kg)   LMP 06/07/2010 (Approximate)   BMI 24.41 kg/m    Physical Exam Vitals and nursing note reviewed.  Constitutional:      Appearance: She is well-developed.  HENT:     Head: Normocephalic and atraumatic.  Eyes:     Conjunctiva/sclera: Conjunctivae normal.  Cardiovascular:     Rate and Rhythm: Normal rate and regular rhythm.     Heart sounds: Normal heart sounds.  Pulmonary:     Effort: Pulmonary effort is normal.     Breath sounds: Normal breath sounds.  Abdominal:     General: Bowel sounds are normal.     Palpations: Abdomen is soft.  Musculoskeletal:     Cervical back: Normal range of motion.  Lymphadenopathy:     Cervical: No cervical adenopathy.  Skin:     General: Skin is warm and dry.     Capillary Refill: Capillary refill takes less than 2 seconds.  Neurological:     Mental Status: She is alert and oriented to person, place, and time.  Psychiatric:        Behavior: Behavior normal.     Musculoskeletal Exam: C-spine, thoracic and lumbar spine were in good range of motion.  Shoulder joints, elbow joints, wrist joints, MCPs PIPs and DIPs with good range of motion with no synovitis.  Hip joints, knee joints, ankles, MTPs and PIPs with good range of motion with no synovitis.  She had few tender points and  hyperalgesia.  CDAI Exam: CDAI Score: -- Patient Global: --; Provider Global: -- Swollen: --; Tender: -- Joint Exam 01/29/2021   No joint exam has been documented for this visit   There is currently no information documented on the homunculus. Go to the Rheumatology activity and complete the homunculus joint exam.  Investigation: No additional findings.  Imaging: MM 3D SCREEN BREAST BILATERAL  Result Date: 01/08/2021 CLINICAL DATA:  Screening. EXAM: DIGITAL SCREENING BILATERAL MAMMOGRAM WITH TOMOSYNTHESIS AND CAD TECHNIQUE: Bilateral screening digital craniocaudal and mediolateral oblique mammograms were obtained. Bilateral screening digital breast tomosynthesis was performed. The images were evaluated with computer-aided detection. COMPARISON:  Previous exam(s). ACR Breast Density Category b: There are scattered areas of fibroglandular density. FINDINGS: There are no findings suspicious for malignancy. IMPRESSION: No mammographic evidence of malignancy. A result letter of this screening mammogram will be mailed directly to the patient. RECOMMENDATION: Screening mammogram in one year. (Code:SM-B-01Y) BI-RADS CATEGORY  1: Negative. Electronically Signed   By: Margarette Canada M.D.   On: 01/08/2021 13:26    Recent Labs: Lab Results  Component Value Date   WBC 5.2 10/28/2020   HGB 12.0 10/28/2020   PLT 344.0 10/28/2020   NA 127 (L) 12/09/2020    K 4.8 12/09/2020   CL 94 (L) 12/09/2020   CO2 27 12/09/2020   GLUCOSE 69 (L) 12/09/2020   BUN 13 12/09/2020   CREATININE 0.63 12/09/2020   BILITOT 0.3 10/28/2020   ALKPHOS 55 10/28/2020   AST 16 10/28/2020   ALT 15 10/28/2020   PROT 7.2 10/30/2020   ALBUMIN 4.2 10/28/2020   CALCIUM 9.0 12/09/2020   GFRAA >60 09/26/2019   10/28/20: ANA 1:320 cytoplasmic, Ace 41, RF-, Anti-CCP-, CRP<1, ESR 25, TSH 1.69, uric acid 3.0, vitamin D 46.56, PTH 37, iron 50, transferrin 285.  January 06, 2021 AVISE lupus index -2.1, ANA negative, ENA negative, Jo 1 negative, anticardiolipin negative, beta-2 negative, antihistone negative, antiphosphatidylserine negative, RF negative, anti-CCP negative, anticardiolipin negative, antithyroglobulin negative, anti-TPO negative  Speciality Comments: No specialty comments available.  Procedures:  No procedures performed Allergies: Sulfa antibiotics   Assessment / Plan:     Visit Diagnoses: Positive ANA (antinuclear antibody) - AVISE lupus index -2.1.  ANA, ENA were negative.  Her autoimmune work-up was completely negative.  Left findings were discussed with the patient.  There is no history of oral ulcers, nasal ulcers, malar rash, photosensitivity, Raynaud's phenomenon or lymphadenopathy.  She gives history of sicca which is most likely related to aging process and also medications.  Primary osteoarthritis of both hands - Clinical findings are consistent with osteoarthritis.  A handout on hand exercises was given today.  Joint protection muscle strengthening was discussed.  Piriformis syndrome, right - Tenderness noted over the piriformis region.  Sacroiliac joint disease-patient states is related to the cyst on her back.  She will need surgery.  History of total hip replacement, bilateral - Right total hip replacement April 2022, left total hip replacement 2018 by Dr. Rhona Raider due to osteoarthritis.  Status post total knee replacement, left - 2017 by Dr.  Rhona Raider.  A handout on lower extremity muscle strengthening exercises was given.  She has some discomfort in her right knee joint.  Lower extremity muscle strengthening exercises were given.  DDD (degenerative disc disease), cervical-she continues to have some neck stiffness and discomfort.  A handout on neck exercises was given.  Age-related osteoporosis without current pathological fracture - November 05, 2020 DEXA scan showed T score -2.4RFN.  She started on  Fosamax.  Myofascial pain - Regular exercise, stretching and water aerobics was discussed.  Detailed counseling was provided.  Need for regular exercise was emphasized.  Primary insomnia-better on trazodone.  Other fatigue-improved.  History of IBS  Interstitial cystitis - She goes to physical therapy with [facility].  Hx of migraines  History of depression - History of anxiety and depression.  She has been seeing a Social worker.  She also goes to alcohol Anonymous.  She has been dry for 10 years.  Multiple thyroid nodules  Orders: No orders of the defined types were placed in this encounter.  No orders of the defined types were placed in this encounter.   Follow-Up Instructions: Return if symptoms worsen or fail to improve, for Osteoarthritis.   Bo Merino, MD  Note - This record has been created using Editor, commissioning.  Chart creation errors have been sought, but may not always  have been located. Such creation errors do not reflect on  the standard of medical care.

## 2021-01-18 NOTE — Progress Notes (Signed)
Refills of Vagifem sent to patient pharmacy.

## 2021-01-19 DIAGNOSIS — F332 Major depressive disorder, recurrent severe without psychotic features: Secondary | ICD-10-CM | POA: Diagnosis not present

## 2021-01-20 DIAGNOSIS — F332 Major depressive disorder, recurrent severe without psychotic features: Secondary | ICD-10-CM | POA: Diagnosis not present

## 2021-01-20 NOTE — Progress Notes (Signed)
Gabriela Shepard [place] Phone: [phone] Subjective:   [name], am serving as a scribe for Dr. Hulan Saas. This visit occurred during the SARS-CoV-2 public health emergency.  Safety protocols were in place, including screening questions prior to the visit, additional usage of staff PPE, and extensive cleaning of exam room while observing appropriate contact time as indicated for disinfecting solutions.   I'm seeing this patient by the request  of:  Gabriela Post, MD  CC: Low back pain abnormal shoulder pain  OZD:[id number]  12/09/2020 Patient does have sacroiliac dysfunction as well as pain.  Patient does have mild tightness noted of the area on the right hip.  Discussed with patient that this is still within the timeline that could be contributing to steroid at this moment of some of the discomfort and pain.  Encouraged her to continue to increase activity slowly.  We will can continue with physical therapy but listening to what activities potentially can cause some problems.  Patient will be following up with me again in 5 to 6 weeks.  Recurrent depression and patient is going to be following up with primary care provider.  Discussed with patient about discontinuing the gabapentin if she does think that this could potentially causing it.  Encourage patient to consider the possibility also of may be Cymbalta for more pain control as well.  Patient will discuss with primary care provider but hopefully patients will continue to work with her therapist as well.  Do feel once this seems to get better hopefully we can then concentrate more on the pain.  Update 01/21/2021 ANALYS Gabriela Shepard is a 60 y.o. female coming in with complaint of SI joint disease. Patient has not been as active due to pain. Had MRI 12/26/2020. Patient is having epidural on August 31st and will also do PT with [facility]. Patient is also having upper back  and neck pain. Is having a hard time sleeping due to pain.  Patient states that the trigger point injection she has been given a long time ago seem to be more helpful.  Starting to have increasing discomfort and pain again.  Concerned that some of it could be radicular symptoms.     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   History of UTI    Interstitial cystitis    Low sodium levels    Migraines    Osteoporosis    PONV (postoperative nausea and vomiting)    PTSD (Shepard-traumatic stress disorder)    on meds   STD (sexually transmitted disease)    Substance abuse ([place])    hx of alcohol   Urine incontinence    Past Surgical History:  Procedure Laterality Date   BREAST BIOPSY Left    2017 benign x 2   CESAREAN SECTION  1992   COLONOSCOPY  2018   JMP-MAC-suprep(good)-polyps   INCONTINENCE SURGERY     LAPAROSCOPY     under bladder   NECK SURGERY  02/24/2018   ACDF  POLYPECTOMY  2018   polyps removed   TOTAL HIP ARTHROPLASTY Right 09/08/2020   TOTAL HIP ARTHROPLASTY Left 2018   TOTAL KNEE ARTHROPLASTY Left 12/23/2015   Procedure: TOTAL KNEE ARTHROPLASTY;  Surgeon: Melrose Nakayama, MD;  Location: [facility];  Service: Orthopedics;  Laterality: Left;   Social History   Socioeconomic History   Marital status: Divorced    Spouse name: Not on file   Number of children: Not on file   Years of education: Not on file   Highest education level: Not on file  Occupational History   Not on file  Tobacco Use   Smoking status: Former    Packs/day: 0.50    Years: 10.00    Pack years: 5.00    Types: Cigarettes    Quit date: 11/13/2010    Years since quitting: 10.1    Smokeless tobacco: Never  Vaping Use   Vaping Use: Never used  Substance and Sexual Activity   Alcohol use: No    Comment: hx of ETOH abuse-sober since 11/2010    Drug use: No   Sexual activity: Not Currently    Birth control/protection: Shepard-menopausal  Other Topics Concern   Not on file  Social History Narrative   Not on file   Social Determinants of Health   Financial Resource Strain: Not on file  Food Insecurity: Not on file  Transportation Needs: Not on file  Physical Activity: Not on file  Stress: Not on file  Social Connections: Not on file   Allergies  Allergen Reactions   Sulfa Antibiotics Hives   Family History  Problem Relation Age of Onset   Hypertension Mother    Lung cancer Mother    Kidney cancer Father    Ulcers Father    Heart disease Father    Drug abuse Sister    Alcoholism Brother    Hypertension Maternal Grandmother    Alcohol abuse Other    Arthritis Other    Hypertension Other    Mental illness Other    Healthy Daughter    Healthy Son    Colon cancer Neg Hx    Esophageal cancer Neg Hx    Colon polyps Neg Hx    Rectal cancer Neg Hx    Stomach cancer Neg Hx        Current Outpatient Medications (Analgesics):    acetaminophen-codeine (TYLENOL #3) 300-30 MG tablet, Take 1 tablet by mouth 2 (two) times daily as needed.   AIMOVIG 70 MG/ML SOAJ, INJECT 70 MG INTO THE SKIN EVERY 30 (THIRTY) DAYS.   meloxicam (MOBIC) 7.5 MG tablet, TAKE 1 TABLET BY MOUTH EVERY DAY   SUMAtriptan (IMITREX) 100 MG tablet, TAKE 1 TABLET BY MOUTH AS NEEDED FOR MIGRAINE HEADACHE, MAY REPEAT ONCE IN TWO HOURS AS NEEDED BUT NO MORE THAN 2 IN 24 HOURS.   Current Outpatient Medications (Other):    Cholecalciferol (VITAMIN D3) 1.25 MG (50000 UT) CAPS, TAKE 1 CAPSULE BY MOUTH ONE TIME PER WEEK   clonazePAM (KLONOPIN) 1 MG tablet, Take 1 tablet (1 mg total) by mouth 2 (two) times daily.   clotrimazole-betamethasone (LOTRISONE) cream, APPLY TO AFFECTED AREA TWICE A DAY  (Patient taking differently: 2 (two) times daily as needed.)   cyclobenzaprine (FLEXERIL) 10 MG tablet, Take 1 tablet by mouth every 8 (eight) hours as needed.   escitalopram (LEXAPRO) 10 MG tablet, TAKE 1 TABLET BY MOUTH EVERY DAY   esomeprazole (NEXIUM) 40 MG capsule, TAKE 1 CAPSULE BY MOUTH EVERY DAY (Patient taking differently:  daily as needed.)   Estradiol 10 MCG TABS vaginal tablet, Place 1 tablet (10 mcg total) vaginally 2 (two) times a week.   hydrocortisone (ANUSOL-HC) 2.5 % rectal cream, Place rectally 2 (two) times daily. Use as needed for hemorrhoids.   hydrOXYzine (ATARAX/VISTARIL) 25 MG tablet, Take 1 tablet (25 mg total) by mouth at bedtime.   Multiple Vitamin (MULTIVITAMIN WITH MINERALS) TABS tablet, Take 1 tablet by mouth daily.   ondansetron (ZOFRAN ODT) 4 MG disintegrating tablet, Take 1 tablet (4 mg total) by mouth every 8 (eight) hours as needed for nausea or vomiting.   phenazopyridine (PYRIDIUM) 200 MG tablet, Take 1 tablet (200 mg total) by mouth 3 (three) times daily as needed.   valACYclovir (VALTREX) 500 MG tablet, Take one tablet po bid x 3 days as needed.   Reviewed prior external information including notes and imaging from  primary care provider As well as notes that were available from care everywhere and other healthcare systems.  Past medical history, social, surgical and family history all reviewed in electronic medical record.  No pertanent information unless stated regarding to the chief complaint.   Review of Systems:  No headache, visual changes, nausea, vomiting, diarrhea, constipation, dizziness, abdominal pain, skin rash, fevers, chills, night sweats, weight loss, swollen lymph nodes,  joint swelling, chest pain, shortness of breath, mood changes. POSITIVE muscle aches, body aches  Objective  Blood pressure 122/86, pulse 63, height _0  (1.626 m), weight 139 lb (63 kg), last menstrual period 06/07/2010, SpO2 98 %.   General: No apparent distress  alert and oriented x3 mood and affect normal, dressed appropriately.  HEENT: Pupils equal, extraocular movements intact  Respiratory: Patient's speak in full sentences and does not appear short of breath  Cardiovascular: No lower extremity edema, non tender, no erythema  Gait normal with good balance and coordination.  MSK: Neck exam does have some limited range of motion.  Patient does have some very mild radicular symptoms with sidebending to the right.  3 distinct trigger points noted in the right shoulder region, 1 in the trapezius, 1 in the rhomboid and 1 in the left latissimus dorsi.  After verbal consent patient was prepped with alcohol swab and with area 25-gauge half inch needle injected into 3 specific trigger points.  Total of 3 cc of 0.5% Marcaine and 1 cc of 40 mg/mL of Kenalog.  Minimal blood loss.  Band-Aid placed.  Postinjection instructions given   Impression and Recommendations:     The above documentation has been reviewed and is accurate and complete Lyndal Pulley, DO

## 2021-01-21 ENCOUNTER — Other Ambulatory Visit: Payer: Self-pay

## 2021-01-21 ENCOUNTER — Encounter: Payer: Self-pay | Admitting: Family Medicine

## 2021-01-21 ENCOUNTER — Ambulatory Visit (INDEPENDENT_AMBULATORY_CARE_PROVIDER_SITE_OTHER): Payer: BC Managed Care – PPO | Admitting: Family Medicine

## 2021-01-21 DIAGNOSIS — M25511 Pain in right shoulder: Secondary | ICD-10-CM

## 2021-01-21 DIAGNOSIS — F332 Major depressive disorder, recurrent severe without psychotic features: Secondary | ICD-10-CM | POA: Diagnosis not present

## 2021-01-21 MED ORDER — TRAZODONE HCL 50 MG PO TABS: 25.0000 mg | ORAL_TABLET | Freq: Every evening | ORAL | 3 refills | Status: DC | PRN

## 2021-01-21 NOTE — Patient Instructions (Signed)
Thigh compression sleeve Shorten stride See me again in 3 months

## 2021-01-21 NOTE — Assessment & Plan Note (Addendum)
Chronic problem with exacerbation.  Likely some of this is secondary to cervical radiculopathy is a possibility.  Patient responded well though to the injections and hopefully will continue to.  We discussed the stability exercises that I think will be helpful.  Trazodone given at nighttime as well.

## 2021-01-22 DIAGNOSIS — F332 Major depressive disorder, recurrent severe without psychotic features: Secondary | ICD-10-CM | POA: Diagnosis not present

## 2021-01-23 DIAGNOSIS — F332 Major depressive disorder, recurrent severe without psychotic features: Secondary | ICD-10-CM | POA: Diagnosis not present

## 2021-01-26 DIAGNOSIS — F332 Major depressive disorder, recurrent severe without psychotic features: Secondary | ICD-10-CM | POA: Diagnosis not present

## 2021-01-27 DIAGNOSIS — F332 Major depressive disorder, recurrent severe without psychotic features: Secondary | ICD-10-CM | POA: Diagnosis not present

## 2021-01-28 ENCOUNTER — Encounter: Payer: Self-pay | Admitting: Obstetrics and Gynecology

## 2021-01-28 ENCOUNTER — Ambulatory Visit (INDEPENDENT_AMBULATORY_CARE_PROVIDER_SITE_OTHER): Payer: BC Managed Care – PPO | Admitting: Obstetrics and Gynecology

## 2021-01-28 ENCOUNTER — Other Ambulatory Visit: Payer: Self-pay

## 2021-01-28 VITALS — BP 123/73 | HR 59

## 2021-01-28 DIAGNOSIS — M62838 Other muscle spasm: Secondary | ICD-10-CM | POA: Diagnosis not present

## 2021-01-28 DIAGNOSIS — N3010 Interstitial cystitis (chronic) without hematuria: Secondary | ICD-10-CM | POA: Diagnosis not present

## 2021-01-28 DIAGNOSIS — N3281 Overactive bladder: Secondary | ICD-10-CM

## 2021-01-28 DIAGNOSIS — F332 Major depressive disorder, recurrent severe without psychotic features: Secondary | ICD-10-CM | POA: Diagnosis not present

## 2021-01-28 NOTE — Progress Notes (Signed)
[facility] Return Visit  SUBJECTIVE  History of Present Illness: Gabriela Shepard is a 60 y.o. female seen in follow-up for IC and pelvic floor muscle spasm. Had a cystoscopy which did not show any concerning findings. Had undergone a series of trigger point injections and bladder instillations.   Has not had any bladder spasms/ flares recently. Feels that her pelvic floor is very tense. Has urine leakage without awareness now. Stopped PT about a month ago. Hip pain has improved.   Has done some work with the pelvic wand, but doesn't feel right always.   Currently she is taking:  - Lexapro - Vagifem - Atarax prn - Cyclobenzaprine prn- not taking regularly.   Past Medical History: Patient  has a past medical history of Allergy, Arthritis, Bowel habit changes, Depression, Flatulence, GERD (gastroesophageal reflux disease), Headache, History of alcohol abuse, History of UTI, Interstitial cystitis, Low sodium levels, Migraines, Osteoporosis, PONV (postoperative nausea and vomiting), PTSD (post-traumatic stress disorder), STD (sexually transmitted disease), Substance abuse ([place]), and Urine incontinence.   Past Surgical History: She  has a past surgical history that includes Cesarean section (1992); laparoscopy; Total knee arthroplasty (Left, 12/23/2015); Breast biopsy (Left); Neck surgery (02/24/2018); Total hip arthroplasty (Right, 09/08/2020); Incontinence surgery; Total hip arthroplasty (Left, 2018); Colonoscopy (2018); and Polypectomy (2018).   Medications: She has a current medication list which includes the following prescription(s): acetaminophen-codeine, aimovig, fosamax plus d, vitamin d3, clonazepam, clotrimazole-betamethasone, cyclobenzaprine, escitalopram, esomeprazole, estradiol, hydrocortisone, hydroxyzine, meloxicam, multivitamin with minerals, ondansetron, phenazopyridine, sumatriptan, trazodone, valacyclovir, and [DISCONTINUED] fluticasone.   Allergies: Patient  is allergic to sulfa antibiotics.   Social History: Patient  reports that she quit smoking about 10 years ago. Her smoking use included cigarettes. She has a 5.00 pack-year smoking history. She has never used smokeless tobacco. She reports that she does not drink alcohol and does not use drugs.      OBJECTIVE     Physical Exam: Vitals:   01/28/21 1117  BP: 123/73  Pulse: (!) 59    Gen: No apparent distress, A&O x 3.  Digital exam: levator ani not on tension and no pain on palpation. Mild tenderness with palpation of obturator internus on the right.    ASSESSMENT AND PLAN    Gabriela Shepard is a 60 y.o. with:  1. Other muscle spasm   2. Interstitial cystitis   3. Overactive bladder      1. IC - continue current regimen \ - can return for bladder instillations as needed  2. Muscle spasm - When she is feeling increased tension in the pelvic floor, she should take the cyclobenzaprine for a few days in a row at night. She should also not do kegels during this time.   3. OAB - We reviewed bladder retraining. She was able to demonstrate pelvic floor contractions and will work on those at home to strengthen pelvic floor muscles.  - If she does not have improvement, can start medication for OAB  Return as needed  Jaquita Folds, MD  Time spent: I spent 20 minutes dedicated to the care of this patient on the date of this encounter to include pre-visit review of records, face-to-face time with the patient  and post visit documentation and ordering medication/ testing.

## 2021-01-29 ENCOUNTER — Encounter: Payer: Self-pay | Admitting: Rheumatology

## 2021-01-29 ENCOUNTER — Ambulatory Visit (INDEPENDENT_AMBULATORY_CARE_PROVIDER_SITE_OTHER): Payer: BC Managed Care – PPO | Admitting: Rheumatology

## 2021-01-29 ENCOUNTER — Ambulatory Visit (INDEPENDENT_AMBULATORY_CARE_PROVIDER_SITE_OTHER): Payer: BC Managed Care – PPO | Admitting: *Deleted

## 2021-01-29 VITALS — BP 127/88 | HR 58 | Ht 64.0 in | Wt 142.2 lb

## 2021-01-29 DIAGNOSIS — M19041 Primary osteoarthritis, right hand: Secondary | ICD-10-CM | POA: Diagnosis not present

## 2021-01-29 DIAGNOSIS — F332 Major depressive disorder, recurrent severe without psychotic features: Secondary | ICD-10-CM | POA: Diagnosis not present

## 2021-01-29 DIAGNOSIS — F5101 Primary insomnia: Secondary | ICD-10-CM

## 2021-01-29 DIAGNOSIS — M7918 Myalgia, other site: Secondary | ICD-10-CM

## 2021-01-29 DIAGNOSIS — R768 Other specified abnormal immunological findings in serum: Secondary | ICD-10-CM | POA: Diagnosis not present

## 2021-01-29 DIAGNOSIS — Z96652 Presence of left artificial knee joint: Secondary | ICD-10-CM

## 2021-01-29 DIAGNOSIS — M810 Age-related osteoporosis without current pathological fracture: Secondary | ICD-10-CM

## 2021-01-29 DIAGNOSIS — M19042 Primary osteoarthritis, left hand: Secondary | ICD-10-CM

## 2021-01-29 DIAGNOSIS — Z23 Encounter for immunization: Secondary | ICD-10-CM | POA: Diagnosis not present

## 2021-01-29 DIAGNOSIS — G5701 Lesion of sciatic nerve, right lower limb: Secondary | ICD-10-CM

## 2021-01-29 DIAGNOSIS — M533 Sacrococcygeal disorders, not elsewhere classified: Secondary | ICD-10-CM

## 2021-01-29 DIAGNOSIS — Z8719 Personal history of other diseases of the digestive system: Secondary | ICD-10-CM

## 2021-01-29 DIAGNOSIS — R5383 Other fatigue: Secondary | ICD-10-CM

## 2021-01-29 DIAGNOSIS — M5030 Other cervical disc degeneration, unspecified cervical region: Secondary | ICD-10-CM

## 2021-01-29 DIAGNOSIS — Z96643 Presence of artificial hip joint, bilateral: Secondary | ICD-10-CM

## 2021-01-29 DIAGNOSIS — Z8669 Personal history of other diseases of the nervous system and sense organs: Secondary | ICD-10-CM

## 2021-01-29 DIAGNOSIS — N3010 Interstitial cystitis (chronic) without hematuria: Secondary | ICD-10-CM

## 2021-01-29 DIAGNOSIS — Z8659 Personal history of other mental and behavioral disorders: Secondary | ICD-10-CM

## 2021-01-29 DIAGNOSIS — E042 Nontoxic multinodular goiter: Secondary | ICD-10-CM

## 2021-01-29 NOTE — Patient Instructions (Signed)
Cervical Strain and Sprain Rehab Ask your health care provider which exercises are safe for you. Do exercises exactly as told by your health care provider and adjust them as directed. It is normal to feel mild stretching, pulling, tightness, or discomfort as you do these exercises. Stop right away if you feel sudden pain or your pain gets worse. Do not begin these exercises until told by your health care provider. Stretching and range-of-motion exercises Cervical side bending  Using good posture, sit on a stable chair or stand up. Without moving your shoulders, slowly tilt your left / right ear to your shoulder until you feel a stretch in the opposite side neck muscles. You should be looking straight ahead. Hold for __________ seconds. Repeat with the other side of your neck. Repeat __________ times. Complete this exercise __________ times a day. Cervical rotation  Using good posture, sit on a stable chair or stand up. Slowly turn your head to the side as if you are looking over your left / right shoulder. Keep your eyes level with the ground. Stop when you feel a stretch along the side and the back of your neck. Hold for __________ seconds. Repeat this by turning to your other side. Repeat __________ times. Complete this exercise __________ times a day. Thoracic extension and pectoral stretch Roll a towel or a small blanket so it is about 4 inches (10 cm) in diameter. Lie down on your back on a firm surface. Put the towel lengthwise, under your spine in the middle of your back. It should not be under your shoulder blades. The towel should line up with your spine from your middle back to your lower back. Put your hands behind your head and let your elbows fall out to your sides. Hold for __________ seconds. Repeat __________ times. Complete this exercise __________ times a day. Strengthening exercises Isometric upper cervical flexion Lie on your back with a thin pillow behind your head  and a small rolled-up towel under your neck. Gently tuck your chin toward your chest and nod your head down to look toward your feet. Do not lift your head off the pillow. Hold for __________ seconds. Release the tension slowly. Relax your neck muscles completely before you repeat this exercise. Repeat __________ times. Complete this exercise __________ times a day. Isometric cervical extension  Stand about 6 inches (15 cm) away from a wall, with your back facing the wall. Place a soft object, about 6-8 inches (15-20 cm) in diameter, between the back of your head and the wall. A soft object could be a small pillow, a ball, or a folded towel. Gently tilt your head back and press into the soft object. Keep your jaw and forehead relaxed. Hold for __________ seconds. Release the tension slowly. Relax your neck muscles completely before you repeat this exercise. Repeat __________ times. Complete this exercise __________ times a day. Posture and body mechanics Body mechanics refers to the movements and positions of your body while you do your daily activities. Posture is part of body mechanics. Good posture and healthy body mechanics can help to relieve stress in your body's tissues and joints. Good posture means that your spine is in its natural S-curve position (your spine is neutral), your shoulders are pulled back slightly, and your head is not tipped forward. The following are general guidelines for applying improved posture andbody mechanics to your everyday activities. Sitting  When sitting, keep your spine neutral and keep your feet flat on the floor. Use   a footrest, if necessary, and keep your thighs parallel to the floor. Avoid rounding your shoulders, and avoid tilting your head forward. When working at a desk or a computer, keep your desk at a height where your hands are slightly lower than your elbows. Slide your chair under your desk so you are close enough to maintain good posture. When  working at a computer, place your monitor at a height where you are looking straight ahead and you do not have to tilt your head forward or downward to look at the screen.  Standing  When standing, keep your spine neutral and keep your feet about hip-width apart. Keep a slight bend in your knees. Your ears, shoulders, and hips should line up. When you do a task in which you stand in one place for a long time, place one foot up on a stable object that is 2-4 inches (5-10 cm) high, such as a footstool. This helps keep your spine neutral.  Resting When lying down and resting, avoid positions that are most painful for you. Try to support your neck in a neutral position. You can use a contour pillow or asmall rolled-up towel. Your pillow should support your neck but not push on it. This information is not intended to replace advice given to you by your health care provider. Make sure you discuss any questions you have with your healthcare provider. Document Revised: 09/13/2018 Document Reviewed: 02/22/2018 Elsevier Patient Education  2022 [place] [employer]ers, 15(4), [phone]. Retrieved March 13, 2018 from http://clinicalkey.com/nursing">  Knee Exercises Ask your health care provider which exercises are safe for you. Do exercises exactly as told by your health care provider and adjust them as directed. It is normal to feel mild stretching, pulling, tightness, or discomfort as you do these exercises. Stop right away if you feel sudden pain or your pain gets worse. Do not begin these exercises until told by your health care provider. Stretching and range-of-motion exercises These exercises warm up your muscles and joints and improve the movement and flexibility of your knee. These exercises also help to relieve pain andswelling. Knee extension, prone Lie on your abdomen (prone position) on a bed. Place your left / right knee just beyond the edge of the surface so your knee is  not on the bed. You can put a towel under your left / right thigh just above your kneecap for comfort. Relax your leg muscles and allow gravity to straighten your knee (extension). You should feel a stretch behind your left / right knee. Hold this position for __________ seconds. Scoot up so your knee is supported between repetitions. Repeat __________ times. Complete this exercise __________ times a day. Knee flexion, active  Lie on your back with both legs straight. If this causes back discomfort, bend your left / right knee so your foot is flat on the floor. Slowly slide your left / right heel back toward your buttocks. Stop when you feel a gentle stretch in the front of your knee or thigh (flexion). Hold this position for __________ seconds. Slowly slide your left / right heel back to the starting position. Repeat __________ times. Complete this exercise __________ times a day. Quadriceps stretch, prone  Lie on your abdomen on a firm surface, such as a bed or padded floor. Bend your left / right knee and hold your ankle. If you cannot reach your ankle or pant leg, loop a belt around your foot and grab the belt instead. Gently pull  your heel toward your buttocks. Your knee should not slide out to the side. You should feel a stretch in the front of your thigh and knee (quadriceps). Hold this position for __________ seconds. Repeat __________ times. Complete this exercise __________ times a day. Hamstring, supine Lie on your back (supine position). Loop a belt or towel over the ball of your left / right foot. The ball of your foot is on the walking surface, right under your toes. Straighten your left / right knee and slowly pull on the belt to raise your leg until you feel a gentle stretch behind your knee (hamstring). Do not let your knee bend while you do this. Keep your other leg flat on the floor. Hold this position for __________ seconds. Repeat __________ times. Complete this  exercise __________ times a day. Strengthening exercises These exercises build strength and endurance in your knee. Endurance is theability to use your muscles for a long time, even after they get tired. Quadriceps, isometric This exercise stretches the muscles in front of your thigh (quadriceps) without moving your knee joint (isometric). Lie on your back with your left / right leg extended and your other knee bent. Put a rolled towel or small pillow under your knee if told by your health care provider. Slowly tense the muscles in the front of your left / right thigh. You should see your kneecap slide up toward your hip or see increased dimpling just above the knee. This motion will push the back of the knee toward the floor. For __________ seconds, hold the muscle as tight as you can without increasing your pain. Relax the muscles slowly and completely. Repeat __________ times. Complete this exercise __________ times a day. Straight leg raises This exercise stretches the muscles in front of your thigh (quadriceps) and the muscles that move your hips (hip flexors). Lie on your back with your left / right leg extended and your other knee bent. Tense the muscles in the front of your left / right thigh. You should see your kneecap slide up or see increased dimpling just above the knee. Your thigh may even shake a bit. Keep these muscles tight as you raise your leg 4-6 inches (10-15 cm) off the floor. Do not let your knee bend. Hold this position for __________ seconds. Keep these muscles tense as you lower your leg. Relax your muscles slowly and completely after each repetition. Repeat __________ times. Complete this exercise __________ times a day. Hamstring, isometric Lie on your back on a firm surface. Bend your left / right knee about __________ degrees. Dig your left / right heel into the surface as if you are trying to pull it toward your buttocks. Tighten the muscles in the back of your  thighs (hamstring) to "dig" as hard as you can without increasing any pain. Hold this position for __________ seconds. Release the tension gradually and allow your muscles to relax completely for __________ seconds after each repetition. Repeat __________ times. Complete this exercise __________ times a day. Hamstring curls If told by your health care provider, do this exercise while wearing ankle weights. Begin with __________ lb weights. Then increase the weight by 1 lb (0.5 kg) increments. Do not wear ankle weights that are more than __________ lb. Lie on your abdomen with your legs straight. Bend your left / right knee as far as you can without feeling pain. Keep your hips flat against the floor. Hold this position for __________ seconds. Slowly lower your leg to the  starting position. Repeat __________ times. Complete this exercise __________ times a day. Squats This exercise strengthens the muscles in front of your thigh and knee (quadriceps). Stand in front of a table, with your feet and knees pointing straight ahead. You may rest your hands on the table for balance but not for support. Slowly bend your knees and lower your hips like you are going to sit in a chair. Keep your weight over your heels, not over your toes. Keep your lower legs upright so they are parallel with the table legs. Do not let your hips go lower than your knees. Do not bend lower than told by your health care provider. If your knee pain increases, do not bend as low. Hold the squat position for __________ seconds. Slowly push with your legs to return to standing. Do not use your hands to pull yourself to standing. Repeat __________ times. Complete this exercise __________ times a day. Wall slides This exercise strengthens the muscles in front of your thigh and knee (quadriceps). Lean your back against a smooth wall or door, and walk your feet out 18-24 inches (46-61 cm) from it. Place your feet hip-width  apart. Slowly slide down the wall or door until your knees bend __________ degrees. Keep your knees over your heels, not over your toes. Keep your knees in line with your hips. Hold this position for __________ seconds. Repeat __________ times. Complete this exercise __________ times a day. Straight leg raises This exercise strengthens the muscles that rotate the leg at the hip and move it away from your body (hip abductors). Lie on your side with your left / right leg in the top position. Lie so your head, shoulder, knee, and hip line up. You may bend your bottom knee to help you keep your balance. Roll your hips slightly forward so your hips are stacked directly over each other and your left / right knee is facing forward. Leading with your heel, lift your top leg 4-6 inches (10-15 cm). You should feel the muscles in your outer hip lifting. Do not let your foot drift forward. Do not let your knee roll toward the ceiling. Hold this position for __________ seconds. Slowly return your leg to the starting position. Let your muscles relax completely after each repetition. Repeat __________ times. Complete this exercise __________ times a day. Straight leg raises This exercise stretches the muscles that move your hips away from the front of the pelvis (hip extensors). Lie on your abdomen on a firm surface. You can put a pillow under your hips if that is more comfortable. Tense the muscles in your buttocks and lift your left / right leg about 4-6 inches (10-15 cm). Keep your knee straight as you lift your leg. Hold this position for __________ seconds. Slowly lower your leg to the starting position. Let your leg relax completely after each repetition. Repeat __________ times. Complete this exercise __________ times a day. This information is not intended to replace advice given to you by your health care provider. Make sure you discuss any questions you have with your healthcare provider. Document  Revised: 03/14/2018 Document Reviewed: 03/14/2018 Elsevier Patient Education  [place] Exercises Hand exercises can be helpful for almost anyone. These exercises can strengthen the hands, improve flexibility and movement, and increase blood flow to the hands. These results can make work and daily tasks easier. Hand exercises can be especially helpful for people who have joint pain from arthritis or have nerve damage from  overuse (carpal tunnel syndrome). These exercises can also help people who have injured a hand. Exercises Most of these hand exercises are gentle stretching and motion exercises. It is usually safe to do them often throughout the day. Warming up your hands before exercise may help to reduce stiffness. You can do this with gentle massage orby placing your hands in warm water for 10-15 minutes. It is normal to feel some stretching, pulling, tightness, or mild discomfort as you begin new exercises. This will gradually improve. Stop an exercise right away if you feel sudden, severe pain or your pain gets worse. Ask your healthcare provider which exercises are best for you. Knuckle bend or "claw" fist Stand or sit with your arm, hand, and all five fingers pointed straight up. Make sure to keep your wrist straight during the exercise. Gently bend your fingers down toward your palm until the tips of your fingers are touching the top of your palm. Keep your big knuckle straight and just bend the small knuckles in your fingers. Hold this position for __________ seconds. Straighten (extend) your fingers back to the starting position. Repeat this exercise 5-10 times with each hand. Full finger fist Stand or sit with your arm, hand, and all five fingers pointed straight up. Make sure to keep your wrist straight during the exercise. Gently bend your fingers into your palm until the tips of your fingers are touching the middle of your palm. Hold this position for __________  seconds. Extend your fingers back to the starting position, stretching every joint fully. Repeat this exercise 5-10 times with each hand. Straight fist Stand or sit with your arm, hand, and all five fingers pointed straight up. Make sure to keep your wrist straight during the exercise. Gently bend your fingers at the big knuckle, where your fingers meet your hand, and the middle knuckle. Keep the knuckle at the tips of your fingers straight and try to touch the bottom of your palm. Hold this position for __________ seconds. Extend your fingers back to the starting position, stretching every joint fully. Repeat this exercise 5-10 times with each hand. Tabletop Stand or sit with your arm, hand, and all five fingers pointed straight up. Make sure to keep your wrist straight during the exercise. Gently bend your fingers at the big knuckle, where your fingers meet your hand, as far down as you can while keeping the small knuckles in your fingers straight. Think of forming a tabletop with your fingers. Hold this position for __________ seconds. Extend your fingers back to the starting position, stretching every joint fully. Repeat this exercise 5-10 times with each hand. Finger spread Place your hand flat on a table with your palm facing down. Make sure your wrist stays straight as you do this exercise. Spread your fingers and thumb apart from each other as far as you can until you feel a gentle stretch. Hold this position for __________ seconds. Bring your fingers and thumb tight together again. Hold this position for __________ seconds. Repeat this exercise 5-10 times with each hand. Making circles Stand or sit with your arm, hand, and all five fingers pointed straight up. Make sure to keep your wrist straight during the exercise. Make a circle by touching the tip of your thumb to the tip of your index finger. Hold for __________ seconds. Then open your hand wide. Repeat this motion with your  thumb and each finger on your hand. Repeat this exercise 5-10 times with each hand. Thumb motion Sit  with your forearm resting on a table and your wrist straight. Your thumb should be facing up toward the ceiling. Keep your fingers relaxed as you move your thumb. Lift your thumb up as high as you can toward the ceiling. Hold for __________ seconds. Bend your thumb across your palm as far as you can, reaching the tip of your thumb for the small finger (pinkie) side of your palm. Hold for __________ seconds. Repeat this exercise 5-10 times with each hand. Grip strengthening  Hold a stress ball or other soft ball in the middle of your hand. Slowly increase the pressure, squeezing the ball as much as you can without causing pain. Think of bringing the tips of your fingers into the middle of your palm. All of your finger joints should bend when doing this exercise. Hold your squeeze for __________ seconds, then relax. Repeat this exercise 5-10 times with each hand. Contact a health care provider if: Your hand pain or discomfort gets much worse when you do an exercise. Your hand pain or discomfort does not improve within 2 hours after you exercise. If you have any of these problems, stop doing these exercises right away. Do not do them again unless your health care provider says that you can. Get help right away if: You develop sudden, severe hand pain or swelling. If this happens, stop doing these exercises right away. Do not do them again unless your health care provider says that you can. This information is not intended to replace advice given to you by your health care provider. Make sure you discuss any questions you have with your healthcare provider. Document Revised: 09/14/2018 Document Reviewed: 05/25/2018 Elsevier Patient Education  [place].

## 2021-01-29 NOTE — Progress Notes (Signed)
Per orders of Dr. Volanda Napoleon, injection of 2nd Shingrix given by Franco Collet. Patient tolerated injection well.

## 2021-01-30 DIAGNOSIS — F332 Major depressive disorder, recurrent severe without psychotic features: Secondary | ICD-10-CM | POA: Diagnosis not present

## 2021-01-31 ENCOUNTER — Other Ambulatory Visit: Payer: Self-pay | Admitting: Family Medicine

## 2021-02-02 DIAGNOSIS — F332 Major depressive disorder, recurrent severe without psychotic features: Secondary | ICD-10-CM | POA: Diagnosis not present

## 2021-02-03 DIAGNOSIS — F332 Major depressive disorder, recurrent severe without psychotic features: Secondary | ICD-10-CM | POA: Diagnosis not present

## 2021-02-04 ENCOUNTER — Ambulatory Visit: Payer: BC Managed Care – PPO | Admitting: Obstetrics and Gynecology

## 2021-02-04 DIAGNOSIS — M47816 Spondylosis without myelopathy or radiculopathy, lumbar region: Secondary | ICD-10-CM | POA: Diagnosis not present

## 2021-02-04 DIAGNOSIS — F332 Major depressive disorder, recurrent severe without psychotic features: Secondary | ICD-10-CM | POA: Diagnosis not present

## 2021-02-05 ENCOUNTER — Encounter: Payer: Self-pay | Admitting: Obstetrics and Gynecology

## 2021-02-05 ENCOUNTER — Other Ambulatory Visit: Payer: Self-pay

## 2021-02-05 ENCOUNTER — Ambulatory Visit (INDEPENDENT_AMBULATORY_CARE_PROVIDER_SITE_OTHER): Payer: BC Managed Care – PPO | Admitting: Family Medicine

## 2021-02-05 ENCOUNTER — Encounter: Payer: Self-pay | Admitting: Family Medicine

## 2021-02-05 VITALS — BP 110/80 | HR 69 | Ht 64.0 in | Wt 140.0 lb

## 2021-02-05 DIAGNOSIS — M9902 Segmental and somatic dysfunction of thoracic region: Secondary | ICD-10-CM | POA: Diagnosis not present

## 2021-02-05 DIAGNOSIS — M9908 Segmental and somatic dysfunction of rib cage: Secondary | ICD-10-CM

## 2021-02-05 DIAGNOSIS — F332 Major depressive disorder, recurrent severe without psychotic features: Secondary | ICD-10-CM | POA: Diagnosis not present

## 2021-02-05 DIAGNOSIS — M940 Chondrocostal junction syndrome [Tietze]: Secondary | ICD-10-CM

## 2021-02-05 DIAGNOSIS — M5412 Radiculopathy, cervical region: Secondary | ICD-10-CM | POA: Diagnosis not present

## 2021-02-05 MED ORDER — PREDNISONE 20 MG PO TABS: 20.0000 mg | ORAL_TABLET | Freq: Every day | ORAL | 0 refills | Status: DC

## 2021-02-05 NOTE — Patient Instructions (Signed)
Prednisone '20mg'$  daily for 7 days Exercises See me in 4-6 weeks

## 2021-02-05 NOTE — Assessment & Plan Note (Signed)
Known ACDF.  We will continue to monitor.  Likely playing somewhat of a role of some of the muscle tightness.

## 2021-02-05 NOTE — Progress Notes (Signed)
Gabriela Shepard [place] [address] [place] Phone: [phone] Subjective:   IVilma Shepard, am serving as a scribe for Dr. Hulan Saas. This visit occurred during the SARS-CoV-2 public health emergency.  Safety protocols were in place, including screening questions prior to the visit, additional usage of staff PPE, and extensive cleaning of exam room while observing appropriate contact time as indicated for disinfecting solutions.   I'm seeing this patient by the request  of:  Eulas Post, MD  CC: Neck and upper back pain  [id number]  01/21/2021 Chronic problem with exacerbation.  Likely some of this is secondary to cervical radiculopathy is a possibility.  Patient responded well though to the injections and hopefully will continue to.  We discussed the stability exercises that I think will be helpful.  Trazodone given at nighttime as well.  Update 02/05/2021 Gabriela Shepard is a 60 y.o. female coming in with complaint of upper back pain. She believes she has a knot in her back that is causing her some discomfort. She will be traveling soon and needs relief.  Patient was last seen 2 weeks ago and did have what appeared to be more trigger points.  States that it was helpful but not completely.  Feels like there is more of a clunking at the medial border of the right scapula.  States that sometimes can catch her breath.  Denies any true chest pain no.      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   History of UTI    Interstitial cystitis    Low sodium levels    Migraines    Osteoporosis    PONV (postoperative nausea  and vomiting)    PTSD (post-traumatic stress disorder)    on meds   STD (sexually transmitted disease)    Substance abuse ([place])    hx of alcohol   Urine incontinence    Past Surgical History:  Procedure Laterality Date   BREAST BIOPSY Left    2017 benign x 2   CESAREAN SECTION  1992   COLONOSCOPY  2018   JMP-MAC-suprep(good)-polyps   INCONTINENCE SURGERY     LAPAROSCOPY     under bladder   NECK SURGERY  02/24/2018   ACDF   POLYPECTOMY  2018   polyps removed   TOTAL HIP ARTHROPLASTY Right 09/08/2020   TOTAL HIP ARTHROPLASTY Left 2018   TOTAL KNEE ARTHROPLASTY Left 12/23/2015   Procedure: TOTAL KNEE ARTHROPLASTY;  Surgeon: Melrose Nakayama, MD;  Location: [facility];  Service: Orthopedics;  Laterality: Left;   Social History   Socioeconomic History   Marital status: Divorced    Spouse name: Not on file   Number of children: Not on file   Years of education: Not on file   Highest education level: Not on file  Occupational History   Not on file  Tobacco Use   Smoking status: Former    Packs/day: 0.50    Years: 10.00    Pack years: 5.00    Types: Cigarettes  Quit date: 11/13/2010    Years since quitting: 10.2   Smokeless tobacco: Never  Vaping Use   Vaping Use: Never used  Substance and Sexual Activity   Alcohol use: No    Comment: hx of ETOH abuse-sober since 11/2010    Drug use: No   Sexual activity: Not Currently    Birth control/protection: Post-menopausal  Other Topics Concern   Not on file  Social History Narrative   Not on file   Social Determinants of Health   Financial Resource Strain: Not on file  Food Insecurity: Not on file  Transportation Needs: Not on file  Physical Activity: Not on file  Stress: Not on file  Social Connections: Not on file   Allergies  Allergen Reactions   Sulfa Antibiotics Hives   Family History  Problem Relation Age of Onset   Hypertension Mother    Lung cancer Mother    Kidney cancer Father    Ulcers Father    Heart  disease Father    Drug abuse Sister    Alcoholism Brother    Hypertension Maternal Grandmother    Alcohol abuse Other    Arthritis Other    Hypertension Other    Mental illness Other    Healthy Daughter    Healthy Son    Colon cancer Neg Hx    Esophageal cancer Neg Hx    Colon polyps Neg Hx    Rectal cancer Neg Hx    Stomach cancer Neg Hx     Current Outpatient Medications (Endocrine & Metabolic):    predniSONE (DELTASONE) 20 MG tablet, Take 1 tablet (20 mg total) by mouth daily with breakfast.   alendronate-cholecalciferol (FOSAMAX PLUS D) 70-2800 MG-UNIT tablet, Take 1 tablet by mouth every 7 (seven) days. Take with a full glass of water on an empty stomach.    Current Outpatient Medications (Analgesics):    acetaminophen-codeine (TYLENOL #3) 300-30 MG tablet, Take 1 tablet by mouth 2 (two) times daily as needed.   AIMOVIG 70 MG/ML SOAJ, INJECT 70 MG INTO THE SKIN EVERY 30 (THIRTY) DAYS.   meloxicam (MOBIC) 7.5 MG tablet, TAKE 1 TABLET BY MOUTH EVERY DAY   SUMAtriptan (IMITREX) 100 MG tablet, TAKE 1 TABLET BY MOUTH AS NEEDED FOR MIGRAINE HEADACHE, MAY REPEAT ONCE IN TWO HOURS AS NEEDED BUT NO MORE THAN 2 IN 24 HOURS.   Current Outpatient Medications (Other):    Cholecalciferol (VITAMIN D3) 1.25 MG (50000 UT) CAPS, TAKE 1 CAPSULE BY MOUTH ONE TIME PER WEEK (Patient taking differently: every 14 (fourteen) days.)   clonazePAM (KLONOPIN) 1 MG tablet, Take 1 tablet (1 mg total) by mouth 2 (two) times daily.   clotrimazole-betamethasone (LOTRISONE) cream, APPLY TO AFFECTED AREA TWICE A DAY (Patient taking differently: 2 (two) times daily as needed.)   cyclobenzaprine (FLEXERIL) 10 MG tablet, Take 1 tablet by mouth every 8 (eight) hours as needed.   escitalopram (LEXAPRO) 10 MG tablet, TAKE 1 TABLET BY MOUTH EVERY DAY   esomeprazole (NEXIUM) 40 MG capsule, TAKE 1 CAPSULE BY MOUTH EVERY DAY (Patient taking differently: daily as needed.)   Estradiol 10 MCG TABS vaginal tablet, Place 1  tablet (10 mcg total) vaginally 2 (two) times a week.   hydrocortisone (ANUSOL-HC) 2.5 % rectal cream, Place rectally 2 (two) times daily. Use as needed for hemorrhoids.   hydrOXYzine (ATARAX/VISTARIL) 25 MG tablet, Take 1 tablet (25 mg total) by mouth at bedtime. (Patient taking differently: Take 25 mg by mouth at bedtime as needed.)  Multiple Vitamin (MULTIVITAMIN WITH MINERALS) TABS tablet, Take 1 tablet by mouth daily.   ondansetron (ZOFRAN ODT) 4 MG disintegrating tablet, Take 1 tablet (4 mg total) by mouth every 8 (eight) hours as needed for nausea or vomiting.   phenazopyridine (PYRIDIUM) 200 MG tablet, Take 1 tablet (200 mg total) by mouth 3 (three) times daily as needed.   traZODone (DESYREL) 50 MG tablet, Take 0.5-1 tablets (25-50 mg total) by mouth at bedtime as needed for sleep.   valACYclovir (VALTREX) 500 MG tablet, Take one tablet po bid x 3 days as needed.   Reviewed prior external information including notes and imaging from  primary care provider As well as notes that were available from care everywhere and other healthcare systems.  Past medical history, social, surgical and family history all reviewed in electronic medical record.  No pertanent information unless stated regarding to the chief complaint.   Review of Systems:  No headache, visual changes, nausea, vomiting, diarrhea, constipation, dizziness, abdominal pain, skin rash, fevers, chills, night sweats, weight loss, swollen lymph nodes, body aches, joint swelling, chest pain, shortness of breath, mood changes. POSITIVE muscle aches  Objective  Blood pressure 110/80, pulse 69, height _0  (1.626 m), weight 140 lb (63.5 kg), last menstrual period 06/07/2010, SpO2 99 %.   General: No apparent distress alert and oriented x3 mood and affect normal, dressed appropriately.  HEENT: Pupils equal, extraocular movements intact  Respiratory: Patient's speak in full sentences and does not appear short of breath   Cardiovascular: No lower extremity edema, non tender, no erythema  Gait normal with good balance and coordination.  MSK: Mild scapular dyskinesis noted.  The patient does have what appears to be muscle spasm noted of the right rhomboid.  Patient does have what appears to be a potential slipped rib noted as well.  Osteopathic findings T7 extended rotated and side bent right with inhaled rib     Impression and Recommendations:     The above documentation has been reviewed and is accurate and complete Gabriela Pulley, DO

## 2021-02-05 NOTE — Assessment & Plan Note (Signed)
Attempted to assess (syndrome with some manipulation.  Patient did have improvement in range of motion almost immediately.  Discussed with patient about icing regimen and home exercises.  Patient given some prednisone to have on hand for her trip.  Patient will follow up with me again in 6 weeks otherwise

## 2021-02-06 ENCOUNTER — Encounter: Payer: Self-pay | Admitting: Family Medicine

## 2021-02-06 DIAGNOSIS — F332 Major depressive disorder, recurrent severe without psychotic features: Secondary | ICD-10-CM | POA: Diagnosis not present

## 2021-02-06 DIAGNOSIS — M47816 Spondylosis without myelopathy or radiculopathy, lumbar region: Secondary | ICD-10-CM | POA: Diagnosis not present

## 2021-02-10 DIAGNOSIS — F332 Major depressive disorder, recurrent severe without psychotic features: Secondary | ICD-10-CM | POA: Diagnosis not present

## 2021-02-12 ENCOUNTER — Encounter: Payer: Self-pay | Admitting: Obstetrics and Gynecology

## 2021-02-16 ENCOUNTER — Other Ambulatory Visit: Payer: Self-pay | Admitting: Obstetrics and Gynecology

## 2021-02-16 DIAGNOSIS — M62838 Other muscle spasm: Secondary | ICD-10-CM

## 2021-02-19 DIAGNOSIS — F332 Major depressive disorder, recurrent severe without psychotic features: Secondary | ICD-10-CM | POA: Diagnosis not present

## 2021-02-20 DIAGNOSIS — F332 Major depressive disorder, recurrent severe without psychotic features: Secondary | ICD-10-CM | POA: Diagnosis not present

## 2021-02-23 ENCOUNTER — Other Ambulatory Visit: Payer: Self-pay

## 2021-02-23 ENCOUNTER — Ambulatory Visit (INDEPENDENT_AMBULATORY_CARE_PROVIDER_SITE_OTHER): Payer: BC Managed Care – PPO | Admitting: Family Medicine

## 2021-02-23 VITALS — BP 124/62 | HR 75 | Temp 97.4°F | Wt 145.8 lb

## 2021-02-23 DIAGNOSIS — J019 Acute sinusitis, unspecified: Secondary | ICD-10-CM | POA: Diagnosis not present

## 2021-02-23 DIAGNOSIS — F332 Major depressive disorder, recurrent severe without psychotic features: Secondary | ICD-10-CM | POA: Diagnosis not present

## 2021-02-23 MED ORDER — MUPIROCIN 2 % EX OINT: 1.0000 "application " | TOPICAL_OINTMENT | Freq: Two times a day (BID) | CUTANEOUS | 0 refills | Status: DC

## 2021-02-23 MED ORDER — AMOXICILLIN-POT CLAVULANATE 875-125 MG PO TABS: 1.0000 | ORAL_TABLET | Freq: Two times a day (BID) | ORAL | 0 refills | Status: DC
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
## 2021-02-25 DIAGNOSIS — F332 Major depressive disorder, recurrent severe without psychotic features: Secondary | ICD-10-CM | POA: Diagnosis not present

## 2021-02-25 DIAGNOSIS — M47816 Spondylosis without myelopathy or radiculopathy, lumbar region: Secondary | ICD-10-CM | POA: Diagnosis not present

## 2021-02-26 DIAGNOSIS — M47816 Spondylosis without myelopathy or radiculopathy, lumbar region: Secondary | ICD-10-CM | POA: Diagnosis not present

## 2021-02-26 DIAGNOSIS — F332 Major depressive disorder, recurrent severe without psychotic features: Secondary | ICD-10-CM | POA: Diagnosis not present

## 2021-02-27 DIAGNOSIS — F332 Major depressive disorder, recurrent severe without psychotic features: Secondary | ICD-10-CM | POA: Diagnosis not present

## 2021-02-27 NOTE — Telephone Encounter (Signed)
Added to wait list.

## 2021-03-02 NOTE — Progress Notes (Signed)
Gabriela Shepard [address] [place] Phone: [phone] Subjective:   [name], am serving as a scribe for Dr. Hulan Saas. This visit occurred during the SARS-CoV-2 public health emergency.  Safety protocols were in place, including screening questions prior to the visit, additional usage of staff PPE, and extensive cleaning of exam room while observing appropriate contact time as indicated for disinfecting solutions.   I'm seeing this patient by the request  of:  Gabriela Post, MD  CC: Continued upper back pain.  [id number]  Gabriela Shepard is a 59 y.o. female coming in with complaint of back and neck pain. OMT on 02/05/2021. Patient states back and neck pain remain the same. Focusing on right scapula and ribs. No new complaints. Does want an opinion on bruising arms.  Patient feels like she is just not herself.  He may be potentially losing weight.  Not as hungry as she has been previously.  Continuing to have discomfort in the neck and the upper back that is unrelenting.  Has failed everything including the home exercises, trigger point injections.  Waking her up at night.  Medications patient has been prescribed: Prednisone  Taking:  Reviewed previous imaging including CT of the chest done in July that showed a left-sided nodule and patient is due to have another CT scan done in January.  Reviewed MRI of the lumbar spine that also did not show anything significant at this time.       Reviewed prior external information including notes and imaging from previsou exam, outside providers and external EMR if available.   As well as notes that were available from care everywhere and other healthcare systems.  Past medical history, social, surgical and family history all reviewed in electronic medical record.  No pertanent information unless stated regarding to the chief complaint.   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 POSITIVE muscle aches, weight loss, body aches  Objective  Blood pressure 102/62, pulse 76, height 5\' 4"  (1.626 m), weight 143 lb (64.9 kg), last menstrual period 06/07/2010, SpO2 98 %.   General: No apparent distress alert and oriented x3 mood and affect normal, dressed appropriately.  HEENT: Pupils equal, extraocular movements intact  Respiratory: Patient's speak in full sentences and does not appear short of breath  Patient does have significant number of bruises noted  on the upper extremity as well as lower extremity.  Fairly large area with patient stating that there was no significant injury.  Patient is having abdominal pain in the right upper quadrant.  No masses appreciated.  Patient is still severely tender to palpation more in the parascapular region on the right side.  Multiple trigger points noted.  Limited range of motion of the neck noted with mild positive Spurling's with radicular symptoms down the right arm in the C6 distribution still.        Assessment and  Plan:       The above documentation has been reviewed and is accurate and complete Lyndal Pulley, DO        Note: This dictation was prepared with Dragon dictation along with smaller phrase technology. Any transcriptional errors that result from this process are unintentional.

## 2021-03-03 ENCOUNTER — Ambulatory Visit (INDEPENDENT_AMBULATORY_CARE_PROVIDER_SITE_OTHER): Payer: BC Managed Care – PPO | Admitting: Family Medicine

## 2021-03-03 ENCOUNTER — Other Ambulatory Visit: Payer: Self-pay

## 2021-03-03 VITALS — BP 102/62 | HR 76 | Ht 64.0 in | Wt 143.0 lb

## 2021-03-03 DIAGNOSIS — R238 Other skin changes: Secondary | ICD-10-CM

## 2021-03-03 DIAGNOSIS — M542 Cervicalgia: Secondary | ICD-10-CM

## 2021-03-03 DIAGNOSIS — R109 Unspecified abdominal pain: Secondary | ICD-10-CM

## 2021-03-03 DIAGNOSIS — M2550 Pain in unspecified joint: Secondary | ICD-10-CM

## 2021-03-03 DIAGNOSIS — R233 Spontaneous ecchymoses: Secondary | ICD-10-CM

## 2021-03-03 DIAGNOSIS — M549 Dorsalgia, unspecified: Secondary | ICD-10-CM

## 2021-03-03 DIAGNOSIS — M5412 Radiculopathy, cervical region: Secondary | ICD-10-CM

## 2021-03-03 DIAGNOSIS — R911 Solitary pulmonary nodule: Secondary | ICD-10-CM

## 2021-03-03 DIAGNOSIS — M47816 Spondylosis without myelopathy or radiculopathy, lumbar region: Secondary | ICD-10-CM | POA: Diagnosis not present

## 2021-03-03 DIAGNOSIS — R5383 Other fatigue: Secondary | ICD-10-CM | POA: Diagnosis not present

## 2021-03-03 DIAGNOSIS — F332 Major depressive disorder, recurrent severe without psychotic features: Secondary | ICD-10-CM | POA: Diagnosis not present

## 2021-03-03 LAB — TSH: TSH: 1.78 u[IU]/mL (ref 0.35–5.50)

## 2021-03-03 LAB — COMPREHENSIVE METABOLIC PANEL
ALT: 14 U/L (ref 0–35)
AST: 18 U/L (ref 0–37)
Albumin: 3.9 g/dL (ref 3.5–5.2)
Alkaline Phosphatase: 43 U/L (ref 39–117)
BUN: 15 mg/dL (ref 6–23)
CO2: 27 mEq/L (ref 19–32)
Calcium: 8.8 mg/dL (ref 8.4–10.5)
Chloride: 97 mEq/L (ref 96–112)
Creatinine, Ser: 0.98 mg/dL (ref 0.40–1.20)
GFR: 62.87 mL/min (ref >60.00)
Glucose, Bld: 90 mg/dL (ref 70–99)
Potassium: 3.9 mEq/L (ref 3.5–5.1)
Sodium: 132 mEq/L — ABNORMAL LOW (ref 135–145)
Total Bilirubin: 0.4 mg/dL (ref 0.2–1.2)
Total Protein: 6.6 g/dL (ref 6.0–8.3)

## 2021-03-03 LAB — CBC WITH DIFFERENTIAL/PLATELET
Basophils Absolute: 0 10*3/uL (ref 0.0–0.1)
Basophils Relative: 0.8 % (ref 0.0–3.0)
Eosinophils Absolute: 0 10*3/uL (ref 0.0–0.7)
Eosinophils Relative: 0.9 % (ref 0.0–5.0)
HCT: 36.5 % (ref 36.0–46.0)
Hemoglobin: 11.9 g/dL — ABNORMAL LOW (ref 12.0–15.0)
Lymphocytes Relative: 29.5 % (ref 12.0–46.0)
Lymphs Abs: 1.5 10*3/uL (ref 0.7–4.0)
MCHC: 32.6 g/dL (ref 30.0–36.0)
MCV: 88.5 fl (ref 78.0–100.0)
Monocytes Absolute: 0.6 10*3/uL (ref 0.1–1.0)
Monocytes Relative: 10.6 % (ref 3.0–12.0)
Neutro Abs: 3 10*3/uL (ref 1.4–7.7)
Neutrophils Relative %: 58.2 % (ref 43.0–77.0)
Platelets: 296 10*3/uL (ref 150.0–400.0)
RBC: 4.12 Mil/uL (ref 3.87–5.11)
RDW: 13.9 % (ref 11.5–15.5)
WBC: 5.2 10*3/uL (ref 4.0–10.5)

## 2021-03-03 LAB — SEDIMENTATION RATE: Sed Rate: 15 mm/hr (ref 0–30)

## 2021-03-03 LAB — URIC ACID: Uric Acid, Serum: 4.5 mg/dL (ref 2.4–7.0)

## 2021-03-03 LAB — IBC PANEL
Iron: 116 ug/dL (ref 42–145)
Saturation Ratios: 32.5 % (ref 20.0–50.0)
TIBC: 357 ug/dL (ref 250.0–450.0)
Transferrin: 255 mg/dL (ref 212.0–360.0)

## 2021-03-03 LAB — FERRITIN: Ferritin: 46.2 ng/mL (ref 10.0–291.0)

## 2021-03-03 LAB — VITAMIN D 25 HYDROXY (VIT D DEFICIENCY, FRACTURES): VITD: 58.28 ng/mL (ref 30.00–100.00)

## 2021-03-03 MED ORDER — TIZANIDINE HCL 4 MG PO TABS: 4.0000 mg | ORAL_TABLET | Freq: Every evening | ORAL | 2 refills | Status: AC

## 2021-03-03 NOTE — Patient Instructions (Signed)
See you again once we get results from labs and imaging

## 2021-03-03 NOTE — Assessment & Plan Note (Signed)
We will get CT scan done to further evaluate the nodule and see if anything else has changed since previous CT scan in with week chronic pain on the thoracic area on the right side.  Feel like the CT would be better than an MRI to further evaluate more underlying aspect.  With the weight loss as well as the bruising we will also further evaluate for any abdominal or liver aspect.

## 2021-03-03 NOTE — Assessment & Plan Note (Signed)
Labs ordered at this time to further evaluate for any reason the patient is having significant bruising.

## 2021-03-03 NOTE — Assessment & Plan Note (Signed)
Patient continues to have pain in the upper back.  Patient does have a history of hyponatremia.  I am concerned that this could be more secondary to the cervical radiculopathy but also patient has had an abnormal finding on CT scan of the lungs previously that needed further evaluation in 6 months.  This was done.  Would like to discuss with radiology to see what other test could help [place] with the muscular components but also further evaluate for anything else that could be underlying.  Patient will have laboratory work-up as well to rule out thrombocytopenia with appropriate we are noticing improvement.  Patient is not taking any true anti-inflammatory on a regular basis at the moment including ibuprofen or aspirin.  she is having that he is resting.  We will also get a CT abdomen with and without contrast with patient having increasing stomach pain and patient is a recovering alcoholic.  Follow-up with me again after imaging to discuss further.  Patient has had weight loss over the last year.

## 2021-03-03 NOTE — Assessment & Plan Note (Signed)
Patient did have an MRI from 3 years ago showing right-sided moderate arthritic changes mostly of the foraminal narrowing from C3-C7 that could be consistent with some of the radicular symptoms causing tightness down the scapular area.  Patient has failed all other conservative therapy including home exercises, trigger point injections, formal physical therapy, muscle relaxers.  Patient has had known arthritic changes as well as surgical intervention previously.  Do feel that advanced imaging is warranted at this time.  Depending on findings we could potentially consider the possibility of epidural steroid injections.

## 2021-03-04 ENCOUNTER — Other Ambulatory Visit: Payer: Self-pay

## 2021-03-04 DIAGNOSIS — R079 Chest pain, unspecified: Secondary | ICD-10-CM

## 2021-03-04 DIAGNOSIS — R0782 Intercostal pain: Secondary | ICD-10-CM

## 2021-03-04 DIAGNOSIS — F332 Major depressive disorder, recurrent severe without psychotic features: Secondary | ICD-10-CM | POA: Diagnosis not present

## 2021-03-04 LAB — CALCIUM, IONIZED: Calcium, Ion: 4.7 mg/dL — ABNORMAL LOW (ref 4.8–5.6)

## 2021-03-04 LAB — PTH, INTACT AND CALCIUM
Calcium: 8.9 mg/dL (ref 8.6–10.4)
PTH: 32 pg/mL (ref 16–77)

## 2021-03-04 LAB — LACTATE DEHYDROGENASE: LDH: 115 U/L — ABNORMAL LOW (ref 120–250)

## 2021-03-04 NOTE — Progress Notes (Signed)
CT ordered. 

## 2021-03-06 ENCOUNTER — Encounter: Payer: Self-pay | Admitting: Family Medicine

## 2021-03-06 DIAGNOSIS — F332 Major depressive disorder, recurrent severe without psychotic features: Secondary | ICD-10-CM | POA: Diagnosis not present

## 2021-03-06 DIAGNOSIS — M47816 Spondylosis without myelopathy or radiculopathy, lumbar region: Secondary | ICD-10-CM | POA: Diagnosis not present

## 2021-03-09 DIAGNOSIS — M47816 Spondylosis without myelopathy or radiculopathy, lumbar region: Secondary | ICD-10-CM | POA: Diagnosis not present

## 2021-03-09 DIAGNOSIS — F332 Major depressive disorder, recurrent severe without psychotic features: Secondary | ICD-10-CM | POA: Diagnosis not present

## 2021-03-10 DIAGNOSIS — F4312 Post-traumatic stress disorder, chronic: Secondary | ICD-10-CM | POA: Diagnosis not present

## 2021-03-11 DIAGNOSIS — M47816 Spondylosis without myelopathy or radiculopathy, lumbar region: Secondary | ICD-10-CM | POA: Diagnosis not present

## 2021-03-12 ENCOUNTER — Other Ambulatory Visit: Payer: Self-pay

## 2021-03-12 ENCOUNTER — Encounter: Payer: Self-pay | Admitting: Physical Therapy

## 2021-03-12 ENCOUNTER — Ambulatory Visit: Payer: BC Managed Care – PPO | Admitting: Family Medicine

## 2021-03-12 ENCOUNTER — Encounter: Payer: BC Managed Care – PPO | Attending: Obstetrics and Gynecology | Admitting: Physical Therapy

## 2021-03-12 DIAGNOSIS — F332 Major depressive disorder, recurrent severe without psychotic features: Secondary | ICD-10-CM | POA: Diagnosis not present

## 2021-03-12 DIAGNOSIS — M546 Pain in thoracic spine: Secondary | ICD-10-CM | POA: Diagnosis not present

## 2021-03-12 DIAGNOSIS — M6281 Muscle weakness (generalized): Secondary | ICD-10-CM | POA: Diagnosis not present

## 2021-03-12 DIAGNOSIS — R278 Other lack of coordination: Secondary | ICD-10-CM

## 2021-03-12 DIAGNOSIS — R252 Cramp and spasm: Secondary | ICD-10-CM

## 2021-03-12 DIAGNOSIS — M545 Low back pain, unspecified: Secondary | ICD-10-CM | POA: Diagnosis not present

## 2021-03-12 NOTE — Therapy (Addendum)
[place] at [facility] [address] [place], [place], [address] Phone: [phone]   Fax:  [phone]  Physical Therapy Evaluation  Patient Details  Name: Gabriela Shepard MRN: [id number] Date of Birth: 02/11/1961 Referring Provider (PT): Dr. Sherlene Shams   Encounter Date: 03/12/2021   PT End of Session - 03/12/21 1356     Visit Number 1    Date for PT Re-Evaluation 06/04/21    Authorization Type BC[employer]    Authorization - Visit Number 1    Authorization - Number of Visits 25    PT Start Time 5631    PT Stop Time 4970    PT Time Calculation (min) 50 min    Activity Tolerance Patient tolerated treatment well;No increased pain    Behavior During Therapy WFL for tasks assessed/performed             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   History of UTI    Interstitial cystitis    Low sodium levels    Migraines    Osteoporosis    PONV (postoperative nausea and vomiting)    PTSD (post-traumatic stress disorder)    on meds   STD (sexually transmitted disease)    Substance abuse ([place])    hx of alcohol   Urine incontinence     Past Surgical History:  Procedure Laterality Date   BREAST BIOPSY Left    2017 benign x 2   CESAREAN SECTION  1992   COLONOSCOPY  2018   JMP-MAC-suprep(good)-polyps   INCONTINENCE SURGERY     LAPAROSCOPY     under bladder   NECK SURGERY  02/24/2018   ACDF   POLYPECTOMY  2018   polyps removed   TOTAL HIP ARTHROPLASTY Right 09/08/2020   TOTAL HIP ARTHROPLASTY Left 2018   TOTAL KNEE ARTHROPLASTY Left 12/23/2015   Procedure: TOTAL KNEE ARTHROPLASTY;  Surgeon:  Melrose Nakayama, MD;  Location: [facility];  Service: Orthopedics;  Laterality: Left;    There were no vitals filed for this visit.    Subjective Assessment - 03/12/21 1204     Subjective Leaking urine and no control. Not wearing pads.    Patient Stated Goals sleep without waking up to go to the bathroom; not to stain to have a BM; not leakaing urine    Currently in Pain? Yes    Pain Score 4     Pain Location Back    Pain Orientation Lower;Mid    Pain Descriptors / Indicators Sharp    Pain Type Acute pain    Pain Onset More than a month ago    Pain Frequency Intermittent   mid back constant, low back is intermittent   Aggravating Factors  activites that arch her back , reaching overhead, yoga, sitting at desk and answering thephone    Pain Relieving Factors ice and heat, TENS machine    Multiple Pain Sites No                [facility] PT Assessment - 03/12/21 0001  Assessment   Medical Diagnosis M62.838 Other muscle spasm    Referring Provider (PT) Dr. Sherlene Shams    Onset Date/Surgical Date 11/05/20    Prior Therapy yes for hypertonic bladder and IC      Precautions   Precautions Other (comment);Anterior Hip    Precaution Comments Osteoporosis      Restrictions   Weight Bearing Restrictions No      Balance Screen   Has the patient fallen in the past 6 months No    Has the patient had a decrease in activity level because of a fear of falling?  No    Is the patient reluctant to leave their home because of a fear of falling?  No      Home Ecologist residence      Prior Function   Level of Independence Independent    Vocation Full time employment    Scientific laboratory technician, multi tasking job    Leisure garden, walk      Cognition   Overall Cognitive Status Within Functional Limits for tasks assessed      Posture/Postural Control   Posture/Postural Control No significant limitations      ROM /  Strength   AROM / PROM / Strength AROM;PROM;Strength      AROM   Lumbar Extension decreased by 25%      Strength   Right Hip Extension 4/5    Right Hip External Rotation  4/5    Right Hip Internal Rotation 4/5    Right Hip ABduction 4-/5    Left Hip Extension 4/5    Left Hip External Rotation 4/5    Left Hip Internal Rotation 4/5    Left Hip ABduction 4/5      Palpation   Palpation comment tenderness to the upper abdominals and tight                        Objective measurements completed on examination: See above findings.     Pelvic Floor Special Questions - 03/12/21 0001     Urinary Leakage Yes    Activities that cause leaking With strong urge    Urinary urgency Yes    Urinary frequency every 2 hours; at night may get up 2 times and wakes up due to the bladder    Fecal incontinence No   strain to have a bowel movement. , has to assist the stool due ot the stool too wide for the anal sphincter; Type 1 without her greens and type 4 . 1 BM per day; BM 1 -3 days   Fluid intake water    Falling out feeling (prolapse) No    External Perineal Exam white firm like cyst at the botom of the introitus    Skin Integrity Intact    External Palpation clitorus is attached to the clitoral hood especially on the right    Prolapse None    Pelvic Floor Internal Exam Patient confirms identification and approves PT to assess pelvic floor and treatment    Exam Type Vaginal;Rectal    Palpation when patient bears down she will tighten the rectum; tightness on the lateral sides of the introitus, tenderness located on the righ tiliococcygeus; patient will bear down when trying to tighten the rectum or use the gluteals    Strength --   1/5 for rectum; 2/5 ant. and post and 1/5 on thelateral sides of the vaginal  [place] Adult PT Treatment/Exercise - 03/12/21 0001       Neuro Re-ed    Neuro Re-ed Details  educated patient on how to bulge the pelvic floor with breath  to relax. She then is to contract the pelvic floor after she relaxes to move the muscle through its full range; bulging her pelvic floor to relax with a boel movement and using breath                     PT Education - 03/12/21 1259     Education Details Access Code: [email or web address]  ; education on how to have a bowel movement    Person(s) Educated Patient    Methods Explanation;Demonstration;Verbal cues;Handout    Comprehension Verbalized understanding;Returned demonstration              PT Short Term Goals - 03/12/21 1509       PT SHORT TERM GOAL #1   Title be independent in initial HEP    Time 4    Period Weeks    Status New    Target Date 04/09/21      PT SHORT TERM GOAL #2   Title verbalize and demonstrate neutral seated posture and report postural corrections with work at computer t oreduce back pain    Time 4    Period Weeks    Status New    Target Date 04/09/21      PT SHORT TERM GOAL #3   Title understand correct toileting tecniques to relax the pelvic floor while having a bowel movement or urinating    Time 4    Period Weeks    Status New    Target Date 04/09/21      PT SHORT TERM GOAL #4   Title able to bulge the pelvic floor to reduce straining with a bowel movement and ability to empty her bladder    Time 4    Period Weeks    Status New    Target Date 04/09/21               PT Long Term Goals - 03/12/21 1511       PT LONG TERM GOAL #1   Title be independent in advanced HEP    Time 12    Period Weeks    Status New    Target Date 06/04/21      PT LONG TERM GOAL #2   Title able to walk slowly to the commode when she has the urge to urinate instead of leaking due to improved strength and coordination of the pelvic floor    Time 12    Period Weeks    Status New    Target Date 06/04/21      PT LONG TERM GOAL #3   Title able to fully relax and bulge the pelvic floor to have a bowel movement and not use her hands to mold the stool to  come out of the anal canal    Time 12    Period Weeks    Status New    Target Date 06/04/21      PT LONG TERM GOAL #4   Title back pain decreased >/= 50% with daily activities so she is not tensing up her pelvic floor to compensate    Time 12    Period Weeks    Status New    Target Date 06/04/21  Plan - 03/12/21 1259     Clinical Impression Statement Patient is a 60 year old female with urinary leakage, difficulty with evacuting stool, and back pain for the past 4 months. Patient is not able to hold her urine when she has the urge to urinate and it is a gush of urine. Her vaginal strength is 2/5 anterior and posterio and 1/5 for sides fo the introitus. It takes time for her to fully relax her pelvic floor after a contraction. She will have difficulty with fully emptying her bladder if she does not focus on her relaxing her pelvic floor. She has fascial tightness along the labia majora and monora and ischiocavernosus and bulbocavernosus. The left urethra sphincter does not contract well. When she contracts the vagina she is not able to feel the contraction.She wakes up 2 times per night to urinate. Rectal strengthis 1/5 and when she contracts she will try to bear down. Patient will contract her rectum when she is trying to bear down. Patient reports she will try to push on the anus to squeeze the stool into a smaller diameter to evacuate her stool. Patient reports her back pain is 4/10 with arching her back, overhead activities, yoga and sitting at her desk. Back pain an affect the way she is poisitoning herself to relax the pelvic floor. Paitent has difficulty with diaphragmatic breathing to elongate the pelvic floor. She has tightness and tenderness located in the upper abdominal region making it difficult for the diaphragm to move correctly. Patient would benefit from skilled therapy to work oth her pelvic floor coordination to fully empty her bladder and rectum, reduce  urinary lekaage and reduce back pain to assist with pelvic floor relaxation.    Personal Factors and Comorbidities Comorbidity 3+;Fitness;Past/Current Experience    Comorbidities Bilateral anterior hip replacement; left knee replacement, PTSD, C-section, bladder surgery; osteoporosis    Examination-Activity Limitations Bend;Toileting;Continence;Reach Overhead;Sleep    Examination-Participation Restrictions Community Activity    Stability/Clinical Decision Making Evolving/Moderate complexity    Clinical Decision Making Moderate    Rehab Potential Good    PT Frequency 2x / week    PT Duration 12 weeks    PT Treatment/Interventions ADLs/Self Care Home Management;Biofeedback;Cryotherapy;Electrical Stimulation;Moist Heat;Therapeutic activities;Therapeutic exercise;Neuromuscular re-education;Patient/family education;Manual techniques;Dry needling;Taping    PT Next Visit Plan abdominal massage; educate on perineal massage; go over diaphragmatic breathing with bulging of the pelvic floor; review using the wand; manual work to the pelvic floor on the sides and left urethra sphincter, core engagement, toileting    PT Home Exercise Plan Access Code: [email or web address]    Recommended Other Services wourl like to work on patient back with the pelvic floor if you agree.    Consulted and Agree with Plan of Care Patient             Patient will benefit from skilled therapeutic intervention in order to improve the following deficits and impairments:  Decreased endurance, Impaired tone, Decreased activity tolerance, Decreased strength, Increased fascial restricitons, Pain, Increased muscle spasms, Decreased range of motion, Decreased coordination  Visit Diagnosis: Muscle weakness (generalized) - Plan: PT plan of care cert/re-cert  Cramp and spasm - Plan: PT plan of care cert/re-cert  Other lack of coordination - Plan: PT plan of care cert/re-cert  Acute bilateral low back pain without sciatica - Plan: PT plan of  care cert/re-cert  Pain in thoracic spine - Plan: PT plan of care cert/re-cert     Problem List Patient Active Problem List   Diagnosis Date Noted  Lung nodule 03/03/2021   Easy bruising 03/03/2021   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   Polyarthralgia 10/28/2020   Interstitial cystitis 07/23/2020   Levator spasm 07/23/2020   Age-related osteoporosis without current pathological fracture 08/06/2019   Hyponatremia 08/06/2019   Multiple thyroid nodules 08/06/2019   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   Piriformis syndrome, right 08/09/2018   Weight gain 02/08/2018   Trigger point of right shoulder region 12/22/2017   Cervical radiculopathy at C6 11/29/2017   Osteopenia 03/09/2016   Primary osteoarthritis of left knee 12/23/2015   IBS (irritable bowel syndrome) 02/19/2015   Postmenopausal 12/19/2013   Migraine headache 12/05/2012   Pain, upper back 09/24/2010   Depression, recurrent ([place]) 09/04/2010    Earlie Counts, PT 03/12/21 3:19 PM  [facility] [address], [place], [place], [address] Phone: [phone]   Fax:  [phone]  Name: Gabriela Shepard MRN: [id number] Date of Birth: 04-Mar-1961  PHYSICAL THERAPY DISCHARGE SUMMARY  Visits from Start of Care: 1  Current functional level related to goals / functional outcomes: See above. Patient has called to let us know she has used all of her PT visits for the year and cancelled the remaining appointments.    Remaining deficits: See above.    Education / Equipment: HEP   Patient agrees to discharge. Patient goals were not met. Patient is being discharged due to financial reasons. Patient has used up all of her PT visits. Thank you for the referral. Earlie Counts, PT 04/02/21 1:35 PM

## 2021-03-12 NOTE — Patient Instructions (Signed)
Access Code: [email or web address] URL: [email or web address] Date: 03/12/2021 Prepared by: Earlie Counts  Exercises Supine Diaphragmatic Breathing - 2-3 x daily - 7 x weekly - 1 sets - 10 reps Earlie Counts, PT [facility] [address], [place], [place] [address] W: [phone] [email or web address]

## 2021-03-16 DIAGNOSIS — F332 Major depressive disorder, recurrent severe without psychotic features: Secondary | ICD-10-CM | POA: Diagnosis not present

## 2021-03-17 ENCOUNTER — Encounter: Payer: Self-pay | Admitting: Family Medicine

## 2021-03-18 DIAGNOSIS — F332 Major depressive disorder, recurrent severe without psychotic features: Secondary | ICD-10-CM | POA: Diagnosis not present

## 2021-03-18 DIAGNOSIS — M47816 Spondylosis without myelopathy or radiculopathy, lumbar region: Secondary | ICD-10-CM | POA: Diagnosis not present

## 2021-03-19 MED ORDER — PREDNISONE 20 MG PO TABS: 20.0000 mg | ORAL_TABLET | Freq: Every day | ORAL | 0 refills | Status: DC

## 2021-03-23 ENCOUNTER — Other Ambulatory Visit: Payer: Self-pay

## 2021-03-23 ENCOUNTER — Ambulatory Visit
Admission: RE | Admit: 2021-03-23 | Discharge: 2021-03-23 | Disposition: A | Discharge status: Home / Self Care | Payer: BC Managed Care – PPO | Source: Ambulatory Visit | Attending: Family Medicine | Admitting: Family Medicine

## 2021-03-23 DIAGNOSIS — M542 Cervicalgia: Secondary | ICD-10-CM

## 2021-03-23 DIAGNOSIS — M47816 Spondylosis without myelopathy or radiculopathy, lumbar region: Secondary | ICD-10-CM | POA: Diagnosis not present

## 2021-03-23 DIAGNOSIS — M5412 Radiculopathy, cervical region: Secondary | ICD-10-CM

## 2021-03-24 ENCOUNTER — Ambulatory Visit (INDEPENDENT_AMBULATORY_CARE_PROVIDER_SITE_OTHER): Payer: BC Managed Care – PPO | Admitting: Family Medicine

## 2021-03-24 ENCOUNTER — Other Ambulatory Visit: Payer: Self-pay

## 2021-03-24 VITALS — BP 104/76 | HR 65 | Temp 98.0°F | Wt 144.8 lb

## 2021-03-24 DIAGNOSIS — M542 Cervicalgia: Secondary | ICD-10-CM | POA: Diagnosis not present

## 2021-03-24 DIAGNOSIS — J309 Allergic rhinitis, unspecified: Secondary | ICD-10-CM | POA: Diagnosis not present

## 2021-03-24 DIAGNOSIS — E041 Nontoxic single thyroid nodule: Secondary | ICD-10-CM | POA: Insufficient documentation

## 2021-03-24 MED ORDER — FLUTICASONE PROPIONATE 50 MCG/ACT NA SUSP: 2.0000 | Freq: Every day | NASAL | 11 refills | Status: DC

## 2021-03-24 MED ORDER — ACETAMINOPHEN-CODEINE #3 300-30 MG PO TABS: 1.0000 | ORAL_TABLET | Freq: Four times a day (QID) | ORAL | 0 refills | Status: DC | PRN

## 2021-03-24 NOTE — Progress Notes (Signed)
Established Patient Office Visit  Subjective:  Patient ID: Gabriela Shepard, female    DOB: 06/30/1960  Age: 60 y.o. MRN: [id number]  CC:  Chief Complaint  Patient presents with   Cyst    Cyst on thyroid possibly grown    HPI Gabriela Shepard presents for concern about possible growth of thyroid nodule on left side of thyroid.  She had biopsy of this back in 2021 which showed no evidence for malignancy.  She had follow-up ultrasound done this past summer.  There had been some slight increase in left thyroid nodule.  This previously measured 2.9 x 2.1 x 1.8 cm and this had increased to 3.3 x 2.4 x 1.9 cm.  Did not meet criteria for biopsy.  She feels some fullness in the left side of her neck.  She does not have any true dysphagia but sometimes feels like things feel more full on the left side when she swallows.  She would like to get this reimaged.  She does have some postnasal drainage.  This is almost daily.  Frequently finds her self clearing her throat.  She has ongoing, at times, severe cervical neck pain.   Has seen sports medicine and has pending epidural.  She has tried OTC medications without much relief.   Tramadol has not offered much relief in past.   She has used limited Tylenol #3 with some relief of night pain.  MRI yesterday showed severe C6-7 left and moderate right foraminal narrowing with moderate bilateral foraminal narrrowing C3-4 and C4-5.    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   History of UTI    Interstitial cystitis    Low sodium levels    Migraines    Osteoporosis    PONV (postoperative nausea and vomiting)    PTSD (post-traumatic  stress disorder)    on meds   STD (sexually transmitted disease)    Substance abuse ([place])    hx of alcohol   Urine incontinence     Past Surgical History:  Procedure Laterality Date   BREAST BIOPSY Left    2017 benign x 2   CESAREAN SECTION  1992   COLONOSCOPY  2018   JMP-MAC-suprep(good)-polyps   INCONTINENCE SURGERY     LAPAROSCOPY     under bladder   NECK SURGERY  02/24/2018   ACDF   POLYPECTOMY  2018   polyps removed   TOTAL HIP ARTHROPLASTY Right 09/08/2020   TOTAL HIP ARTHROPLASTY Left 2018   TOTAL KNEE ARTHROPLASTY Left 12/23/2015   Procedure: TOTAL KNEE ARTHROPLASTY;  Surgeon: Melrose Nakayama, MD;  Location: [facility];  Service: Orthopedics;  Laterality: Left;    Family History  Problem Relation Age of Onset   Hypertension Mother    Lung cancer Mother    Kidney cancer Father    Ulcers Father    Heart disease Father    Drug abuse Sister    Alcoholism Brother    Hypertension Maternal Grandmother    Alcohol abuse Other    Arthritis Other    Hypertension Other  Mental illness Other    Healthy Daughter    Healthy Son    Colon cancer Neg Hx    Esophageal cancer Neg Hx    Colon polyps Neg Hx    Rectal cancer Neg Hx    Stomach cancer Neg Hx     Social History   Socioeconomic History   Marital status: Divorced    Spouse name: Not on file   Number of children: Not on file   Years of education: Not on file   Highest education level: Not on file  Occupational History   Not on file  Tobacco Use   Smoking status: Former    Packs/day: 0.50    Years: 10.00    Pack years: 5.00    Types: Cigarettes    Quit date: 11/13/2010    Years since quitting: 10.3   Smokeless tobacco: Never  Vaping Use   Vaping Use: Never used  Substance and Sexual Activity   Alcohol use: No    Comment: hx of ETOH abuse-sober since 11/2010    Drug use: No   Sexual activity: Not Currently    Birth control/protection: Post-menopausal  Other Topics Concern   Not on file  Social  History Narrative   Not on file   Social Determinants of Health   Financial Resource Strain: Not on file  Food Insecurity: Not on file  Transportation Needs: Not on file  Physical Activity: Not on file  Stress: Not on file  Social Connections: Not on file  Intimate Partner Violence: Not on file    Outpatient Medications Prior to Visit  Medication Sig Dispense Refill   AIMOVIG 70 MG/ML SOAJ INJECT 70 MG INTO THE SKIN EVERY 30 (THIRTY) DAYS. 3 mL 1   Cholecalciferol (VITAMIN D3) 1.25 MG (50000 UT) CAPS TAKE 1 CAPSULE BY MOUTH ONE TIME PER WEEK (Patient taking differently: every 14 (fourteen) days.) 12 capsule 3   clonazePAM (KLONOPIN) 1 MG tablet Take 1 tablet (1 mg total) by mouth 2 (two) times daily. 60 tablet 3   clotrimazole-betamethasone (LOTRISONE) cream APPLY TO AFFECTED AREA TWICE A DAY (Patient taking differently: 2 (two) times daily as needed.) 30 g 2   cyclobenzaprine (FLEXERIL) 10 MG tablet Take 1 tablet by mouth every 8 (eight) hours as needed.     escitalopram (LEXAPRO) 10 MG tablet TAKE 1 TABLET BY MOUTH EVERY DAY 90 tablet 2   esomeprazole (NEXIUM) 40 MG capsule TAKE 1 CAPSULE BY MOUTH EVERY DAY (Patient taking differently: daily as needed.) 90 capsule 3   Estradiol 10 MCG TABS vaginal tablet Place 1 tablet (10 mcg total) vaginally 2 (two) times a week. 8 tablet 10   hydrocortisone (ANUSOL-HC) 2.5 % rectal cream Place rectally 2 (two) times daily. Use as needed for hemorrhoids. 30 g 1   hydrOXYzine (ATARAX/VISTARIL) 25 MG tablet Take 1 tablet (25 mg total) by mouth at bedtime. (Patient taking differently: Take 25 mg by mouth at bedtime as needed.) 30 tablet 5   Multiple Vitamin (MULTIVITAMIN WITH MINERALS) TABS tablet Take 1 tablet by mouth daily.     mupirocin ointment (BACTROBAN) 2 % Apply 1 application topically 2 (two) times daily. 22 g 0   ondansetron (ZOFRAN ODT) 4 MG disintegrating tablet Take 1 tablet (4 mg total) by mouth every 8 (eight) hours as needed for nausea  or vomiting. 20 tablet 0   phenazopyridine (PYRIDIUM) 200 MG tablet Take 1 tablet (200 mg total) by mouth 3 (three) times daily as needed. 6 tablet 0  predniSONE (DELTASONE) 20 MG tablet Take 1 tablet (20 mg total) by mouth daily with breakfast. 7 tablet 0   SUMAtriptan (IMITREX) 100 MG tablet TAKE 1 TABLET BY MOUTH AS NEEDED FOR MIGRAINE HEADACHE, MAY REPEAT ONCE IN TWO HOURS AS NEEDED BUT NO MORE THAN 2 IN 24 HOURS. 9 tablet 4   traZODone (DESYREL) 50 MG tablet Take 0.5-1 tablets (25-50 mg total) by mouth at bedtime as needed for sleep. 30 tablet 3   valACYclovir (VALTREX) 500 MG tablet Take one tablet po bid x 3 days as needed. 30 tablet 3   acetaminophen-codeine (TYLENOL #3) 300-30 MG tablet Take 1 tablet by mouth 2 (two) times daily as needed.     alendronate-cholecalciferol (FOSAMAX PLUS D) 70-2800 MG-UNIT tablet Take 1 tablet by mouth every 7 (seven) days. Take with a full glass of water on an empty stomach. (Patient not taking: Reported on 03/24/2021)     meloxicam (MOBIC) 7.5 MG tablet TAKE 1 TABLET BY MOUTH EVERY DAY (Patient not taking: Reported on 03/24/2021) 30 tablet 0   amoxicillin-clavulanate (AUGMENTIN) 875-125 MG tablet Take 1 tablet by mouth 2 (two) times daily. (Patient not taking: Reported on 03/24/2021) 20 tablet 0   No facility-administered medications prior to visit.    Allergies  Allergen Reactions   Sulfa Antibiotics Hives    ROS Review of Systems  Constitutional:  Negative for appetite change, chills, fever and unexpected weight change.  HENT:  Positive for congestion.   Respiratory:  Negative for cough and shortness of breath.   Cardiovascular:  Negative for chest pain.     Objective:    Physical Exam Vitals reviewed.  Constitutional:      Appearance: Normal appearance.  Neck:     Comments: He has scar left side of neck from previous cervical disc surgery.  She does have some fullness left lobe of thyroid but nontender to palpation.  No neck  adenopathy. Cardiovascular:     Rate and Rhythm: Normal rate and regular rhythm.  Pulmonary:     Effort: Pulmonary effort is normal.     Breath sounds: Normal breath sounds.  Neurological:     Mental Status: She is alert.    BP 104/76 (BP Location: Left Arm, Patient Position: Sitting, Cuff Size: Normal)   Pulse 65   Temp 98 F (36.7 C) (Oral)   Wt 144 lb 12.8 oz (65.7 kg)   LMP 06/07/2010 (Approximate)   SpO2 98%   BMI 24.85 kg/m  Wt Readings from Last 3 Encounters:  03/24/21 144 lb 12.8 oz (65.7 kg)  03/03/21 143 lb (64.9 kg)  02/23/21 145 lb 12.8 oz (66.1 kg)     Health Maintenance Due  Topic Date Due   COVID-19 Vaccine (1) Never done   HIV Screening  Never done   INFLUENZA VACCINE  01/05/2021   PAP SMEAR-Modifier  04/18/2021    There are no preventive care reminders to display for this patient.  Lab Results  Component Value Date   TSH 1.78 03/03/2021   Lab Results  Component Value Date   WBC 5.2 03/03/2021   HGB 11.9 (L) 03/03/2021   HCT 36.5 03/03/2021   MCV 88.5 03/03/2021   PLT 296.0 03/03/2021   Lab Results  Component Value Date   NA 132 (L) 03/03/2021   K 3.9 03/03/2021   CO2 27 03/03/2021   GLUCOSE 90 03/03/2021   BUN 15 03/03/2021   CREATININE 0.98 03/03/2021   BILITOT 0.4 03/03/2021   ALKPHOS 43 03/03/2021  AST 18 03/03/2021   ALT 14 03/03/2021   PROT 6.6 03/03/2021   ALBUMIN 3.9 03/03/2021   CALCIUM 8.9 03/03/2021   CALCIUM 8.8 03/03/2021   ANIONGAP 12 09/26/2019   GFR 62.87 03/03/2021   Lab Results  Component Value Date   CHOL 180 10/30/2020   Lab Results  Component Value Date   HDL 75.40 10/30/2020   Lab Results  Component Value Date   LDLCALC 96 10/30/2020   Lab Results  Component Value Date   TRIG 39.0 10/30/2020   Lab Results  Component Value Date   CHOLHDL 2 10/30/2020   Lab Results  Component Value Date   HGBA1C 5.9 05/02/2017      Assessment & Plan:   #1 left thyroid nodule.  Imaged just a few months  ago and had grown slightly from 2021 but not meeting criteria at that time for biopsy.  Patient has concerns this has grown further. -Consider repeat ultrasound neck.  #2 frequent postnasal drip symptoms.  Suspect allergic. -Recommend trial of Flonase with prescription sent in for 1-2 spray each nostril once daily as needed  #3 chronic cervical neck pain.  We agree to send in limited Tylenol #3 for her cervical neck pain.   She is very cautious with opioids because of her ETOH abuse history.    Meds ordered this encounter  Medications   fluticasone (FLONASE) 50 MCG/ACT nasal spray    Sig: Place 2 sprays into both nostrils daily.    Dispense:  16 g    Refill:  11   acetaminophen-codeine (TYLENOL #3) 300-30 MG tablet    Sig: Take 1 tablet by mouth every 6 (six) hours as needed.    Dispense:  20 tablet    Refill:  0    Follow-up: No follow-ups on file.    Carolann Littler, MD

## 2021-03-24 NOTE — Patient Instructions (Signed)
I will set up repeat thyroid ultrasound.

## 2021-03-24 NOTE — Addendum Note (Signed)
Addended by: Carmie Kanner on: 03/24/2021 08:09 AM   Modules accepted: Orders

## 2021-03-25 ENCOUNTER — Other Ambulatory Visit: Payer: Self-pay

## 2021-03-25 ENCOUNTER — Ambulatory Visit
Admission: RE | Admit: 2021-03-25 | Discharge: 2021-03-25 | Disposition: A | Discharge status: Home / Self Care | Payer: BC Managed Care – PPO | Source: Ambulatory Visit | Attending: Family Medicine | Admitting: Family Medicine

## 2021-03-25 ENCOUNTER — Encounter: Payer: Self-pay | Admitting: Family Medicine

## 2021-03-25 DIAGNOSIS — R079 Chest pain, unspecified: Secondary | ICD-10-CM | POA: Diagnosis not present

## 2021-03-25 DIAGNOSIS — M47816 Spondylosis without myelopathy or radiculopathy, lumbar region: Secondary | ICD-10-CM | POA: Diagnosis not present

## 2021-03-25 DIAGNOSIS — J9811 Atelectasis: Secondary | ICD-10-CM | POA: Diagnosis not present

## 2021-03-25 DIAGNOSIS — R0782 Intercostal pain: Secondary | ICD-10-CM

## 2021-03-25 DIAGNOSIS — I700 Atherosclerosis of aorta: Secondary | ICD-10-CM | POA: Diagnosis not present

## 2021-03-25 DIAGNOSIS — K439 Ventral hernia without obstruction or gangrene: Secondary | ICD-10-CM | POA: Diagnosis not present

## 2021-03-25 DIAGNOSIS — R109 Unspecified abdominal pain: Secondary | ICD-10-CM | POA: Diagnosis not present

## 2021-03-25 DIAGNOSIS — R911 Solitary pulmonary nodule: Secondary | ICD-10-CM | POA: Diagnosis not present

## 2021-03-25 MED ORDER — IOPAMIDOL (ISOVUE-300) INJECTION 61%
100.0000 mL | Freq: Once | INTRAVENOUS | Status: AC | PRN
  Administered 2021-03-25: 100 mL via INTRAVENOUS

## 2021-03-26 ENCOUNTER — Telehealth: Payer: Self-pay | Admitting: Family Medicine

## 2021-03-26 ENCOUNTER — Ambulatory Visit
Admission: RE | Admit: 2021-03-26 | Discharge: 2021-03-26 | Disposition: A | Discharge status: Home / Self Care | Payer: BC Managed Care – PPO | Source: Ambulatory Visit | Attending: Family Medicine | Admitting: Family Medicine

## 2021-03-26 DIAGNOSIS — E041 Nontoxic single thyroid nodule: Secondary | ICD-10-CM | POA: Diagnosis not present

## 2021-03-26 NOTE — Telephone Encounter (Signed)
Gabriela Shepard with dr Chalmers Cater office is calling and would like a copy of bone density test to be fax to [phone]

## 2021-03-26 NOTE — Telephone Encounter (Signed)
Paper copy has been faxed.

## 2021-03-27 DIAGNOSIS — S29012D Strain of muscle and tendon of back wall of thorax, subsequent encounter: Secondary | ICD-10-CM | POA: Diagnosis not present

## 2021-03-27 DIAGNOSIS — S161XXD Strain of muscle, fascia and tendon at neck level, subsequent encounter: Secondary | ICD-10-CM | POA: Diagnosis not present

## 2021-03-27 DIAGNOSIS — M6289 Other specified disorders of muscle: Secondary | ICD-10-CM | POA: Diagnosis not present

## 2021-03-28 ENCOUNTER — Encounter: Payer: Self-pay | Admitting: Family Medicine

## 2021-03-30 ENCOUNTER — Ambulatory Visit
Admission: RE | Admit: 2021-03-30 | Discharge: 2021-03-30 | Disposition: A | Discharge status: Home / Self Care | Payer: BC Managed Care – PPO | Source: Ambulatory Visit | Attending: Family Medicine | Admitting: Family Medicine

## 2021-03-30 ENCOUNTER — Other Ambulatory Visit: Payer: Self-pay

## 2021-03-30 DIAGNOSIS — F4312 Post-traumatic stress disorder, chronic: Secondary | ICD-10-CM | POA: Diagnosis not present

## 2021-03-30 DIAGNOSIS — M542 Cervicalgia: Secondary | ICD-10-CM

## 2021-03-30 MED ORDER — IOPAMIDOL (ISOVUE-M 300) INJECTION 61%
1.0000 mL | Freq: Once | INTRAMUSCULAR | Status: AC
  Administered 2021-03-30: 1 mL via EPIDURAL

## 2021-03-30 MED ORDER — TRIAMCINOLONE ACETONIDE 40 MG/ML IJ SUSP (RADIOLOGY)
60.0000 mg | Freq: Once | INTRAMUSCULAR | Status: AC
  Administered 2021-03-30: 60 mg via EPIDURAL

## 2021-03-30 NOTE — Discharge Instructions (Signed)

## 2021-03-31 DIAGNOSIS — S161XXD Strain of muscle, fascia and tendon at neck level, subsequent encounter: Secondary | ICD-10-CM | POA: Diagnosis not present

## 2021-03-31 DIAGNOSIS — M6289 Other specified disorders of muscle: Secondary | ICD-10-CM | POA: Diagnosis not present

## 2021-03-31 DIAGNOSIS — S29012D Strain of muscle and tendon of back wall of thorax, subsequent encounter: Secondary | ICD-10-CM | POA: Diagnosis not present

## 2021-04-01 ENCOUNTER — Other Ambulatory Visit: Payer: Self-pay

## 2021-04-01 ENCOUNTER — Ambulatory Visit (INDEPENDENT_AMBULATORY_CARE_PROVIDER_SITE_OTHER): Payer: BC Managed Care – PPO | Admitting: Sports Medicine

## 2021-04-01 VITALS — BP 120/82 | HR 69 | Ht 64.0 in | Wt 145.0 lb

## 2021-04-01 DIAGNOSIS — M9905 Segmental and somatic dysfunction of pelvic region: Secondary | ICD-10-CM

## 2021-04-01 DIAGNOSIS — M546 Pain in thoracic spine: Secondary | ICD-10-CM | POA: Diagnosis not present

## 2021-04-01 DIAGNOSIS — G8929 Other chronic pain: Secondary | ICD-10-CM | POA: Diagnosis not present

## 2021-04-01 DIAGNOSIS — M9901 Segmental and somatic dysfunction of cervical region: Secondary | ICD-10-CM

## 2021-04-01 DIAGNOSIS — M9908 Segmental and somatic dysfunction of rib cage: Secondary | ICD-10-CM

## 2021-04-01 DIAGNOSIS — M9902 Segmental and somatic dysfunction of thoracic region: Secondary | ICD-10-CM | POA: Diagnosis not present

## 2021-04-01 DIAGNOSIS — M9903 Segmental and somatic dysfunction of lumbar region: Secondary | ICD-10-CM

## 2021-04-01 NOTE — Progress Notes (Signed)
Benito Mccreedy D.[place] Phone: [phone]   Assessment and Plan:     1. Chronic bilateral thoracic back pain 2. Somatic dysfunction of cervical region 3. Somatic dysfunction of thoracic region 4. Somatic dysfunction of lumbar region 5. Somatic dysfunction of pelvic region 6. Somatic dysfunction of rib region -Chronic with exacerbation, subsequent sports medicine visit - Chronic thoracic back pain with scapular dysfunction that has had mild improvement with OMT in the past.  Patient wants to repeat OMT today.  Tolerated well per note below.  No HVLA was performed in cervical region due to history of cervical fusion - Patient has received no significant relief with dry needling with physical therapy.  As this has relieved patient's pain and improved her function more so than any other treatment modality, it is my recommendation that she continue with this treatment   Decision today to treat with OMT was based on Physical Exam   After verbal consent patient was treated with HVLA (high velocity low amplitude), ME (muscle energy), FPR (flex positional release), ST (soft tissue), PC/PD (Pelvic Compression/ Pelvic Decompression) techniques in cervical, rib, thoracic, lumbar, and pelvic areas. Patient tolerated the procedure well with improvement in symptoms.  Patient educated on potential side effects of soreness and recommended to rest, hydrate, and use Tylenol as needed for pain control.   Pertinent previous records reviewed include 03/28/2021, previous office note   Follow Up: In 2 weeks for repeat OMT   Subjective:    Chief Complaint: Left side pain   HPI:   04/01/21 Patient is a 60 year old female presenting with left side pain and thinks a rib is out. Patient was last seen by Dr. [name] on 03/03/21 and received OMT. Today patient states that she started to have pain on her right side mid back and got dry needling and she  tried to manipulate her and she thinks that it hurt the left side of her mid back that it hurts to breathe. Patient states that her PT place had told her that she had unlimited visits and retracted that states stating she had to back pay so much unless a doctor can write on her behalf that she needs the PT because it effects the way she breathes.   Relevant Historical Information: C-spine fusion multiple segments,  Additional pertinent review of systems negative.  Current Outpatient Medications  Medication Sig Dispense Refill   acetaminophen-codeine (TYLENOL #3) 300-30 MG tablet Take 1 tablet by mouth every 6 (six) hours as needed. 20 tablet 0   AIMOVIG 70 MG/ML SOAJ INJECT 70 MG INTO THE SKIN EVERY 30 (THIRTY) DAYS. 3 mL 1   alendronate-cholecalciferol (FOSAMAX PLUS D) 70-2800 MG-UNIT tablet Take 1 tablet by mouth every 7 (seven) days. Take with a full glass of water on an empty stomach.     Cholecalciferol (VITAMIN D3) 1.25 MG (50000 UT) CAPS TAKE 1 CAPSULE BY MOUTH ONE TIME PER WEEK (Patient taking differently: every 14 (fourteen) days.) 12 capsule 3   clonazePAM (KLONOPIN) 1 MG tablet Take 1 tablet (1 mg total) by mouth 2 (two) times daily. 60 tablet 3   clotrimazole-betamethasone (LOTRISONE) cream APPLY TO AFFECTED AREA TWICE A DAY (Patient taking differently: 2 (two) times daily as needed.) 30 g 2   cyclobenzaprine (FLEXERIL) 10 MG tablet Take 1 tablet by mouth every 8 (eight) hours as needed.     escitalopram (LEXAPRO) 10 MG tablet TAKE 1 TABLET BY MOUTH EVERY  DAY 90 tablet 2   esomeprazole (NEXIUM) 40 MG capsule TAKE 1 CAPSULE BY MOUTH EVERY DAY (Patient taking differently: daily as needed.) 90 capsule 3   Estradiol 10 MCG TABS vaginal tablet Place 1 tablet (10 mcg total) vaginally 2 (two) times a week. 8 tablet 10   fluticasone (FLONASE) 50 MCG/ACT nasal spray Place 2 sprays into both nostrils daily. 16 g 11   hydrocortisone (ANUSOL-HC) 2.5 % rectal cream Place rectally 2 (two) times  daily. Use as needed for hemorrhoids. 30 g 1   hydrOXYzine (ATARAX/VISTARIL) 25 MG tablet Take 1 tablet (25 mg total) by mouth at bedtime. (Patient taking differently: Take 25 mg by mouth at bedtime as needed.) 30 tablet 5   meloxicam (MOBIC) 7.5 MG tablet TAKE 1 TABLET BY MOUTH EVERY DAY 30 tablet 0   Multiple Vitamin (MULTIVITAMIN WITH MINERALS) TABS tablet Take 1 tablet by mouth daily.     mupirocin ointment (BACTROBAN) 2 % Apply 1 application topically 2 (two) times daily. 22 g 0   ondansetron (ZOFRAN ODT) 4 MG disintegrating tablet Take 1 tablet (4 mg total) by mouth every 8 (eight) hours as needed for nausea or vomiting. 20 tablet 0   phenazopyridine (PYRIDIUM) 200 MG tablet Take 1 tablet (200 mg total) by mouth 3 (three) times daily as needed. 6 tablet 0   predniSONE (DELTASONE) 20 MG tablet Take 1 tablet (20 mg total) by mouth daily with breakfast. 7 tablet 0   SUMAtriptan (IMITREX) 100 MG tablet TAKE 1 TABLET BY MOUTH AS NEEDED FOR MIGRAINE HEADACHE, MAY REPEAT ONCE IN TWO HOURS AS NEEDED BUT NO MORE THAN 2 IN 24 HOURS. 9 tablet 4   traZODone (DESYREL) 50 MG tablet Take 0.5-1 tablets (25-50 mg total) by mouth at bedtime as needed for sleep. 30 tablet 3   valACYclovir (VALTREX) 500 MG tablet Take one tablet po bid x 3 days as needed. 30 tablet 3   No current facility-administered medications for this visit.      Objective:     Vitals:   04/01/21 1506  BP: 120/82  Pulse: 69  SpO2: 98%  Weight: 145 lb (65.8 kg)  Height: 5\' 4"  (1.626 m)      Body mass index is 24.89 kg/m.    Physical Exam:     General: Well-appearing, cooperative, sitting comfortably in no acute distress.   OMT Physical Exam:  ASIS Compression Test: Positive Right Cervical: TTP paraspinal, significantly limited rotation sidebending and lower cervical spine with history of C-spine fusion Rib: Bilateral elevated first rib with TTP Thoracic: TTP paraspinal, T4-6 RLSR Lumbar: TTP paraspinal, T10-L2  RRSL Pelvis: Right anterior innominate without flare  Electronically signed by:  Benito Mccreedy D.[facility] 4:00 PM 04/01/21

## 2021-04-01 NOTE — Patient Instructions (Addendum)
Good to see you  Letter written to try and help with the PT  See me again in 2 week

## 2021-04-02 ENCOUNTER — Encounter: Payer: BC Managed Care – PPO | Admitting: Physical Therapy

## 2021-04-02 ENCOUNTER — Telehealth: Payer: Self-pay | Admitting: Physical Therapy

## 2021-04-02 ENCOUNTER — Telehealth: Payer: Self-pay | Admitting: Sports Medicine

## 2021-04-02 ENCOUNTER — Encounter: Payer: Self-pay | Admitting: Physical Therapy

## 2021-04-02 NOTE — Telephone Encounter (Signed)
Pt called, saw Dr. [name] recently. At her request, he wrote a letter asking to extend her PT for dry needling. She contacted her insurance who informed her this request cannot come from her. Pt requested we fax letter urgently to insurance, although she provided no fax #. I tried to call but was unable to get a human. Should this request come from PT or [place]? Wondering if we should refer again and have PT try to authorize since we do not normally handle insurance auth for PT?

## 2021-04-02 NOTE — Telephone Encounter (Signed)
Called patient about her missed visit today at 13:00. Left a message.  Earlie Counts, PT @10 /27/2022@ 1:29 PM

## 2021-04-07 DIAGNOSIS — M47816 Spondylosis without myelopathy or radiculopathy, lumbar region: Secondary | ICD-10-CM | POA: Diagnosis not present

## 2021-04-08 ENCOUNTER — Ambulatory Visit: Payer: BC Managed Care – PPO | Admitting: Sports Medicine

## 2021-04-09 DIAGNOSIS — M542 Cervicalgia: Secondary | ICD-10-CM | POA: Diagnosis not present

## 2021-04-09 DIAGNOSIS — M810 Age-related osteoporosis without current pathological fracture: Secondary | ICD-10-CM | POA: Diagnosis not present

## 2021-04-09 DIAGNOSIS — M47816 Spondylosis without myelopathy or radiculopathy, lumbar region: Secondary | ICD-10-CM | POA: Diagnosis not present

## 2021-04-09 DIAGNOSIS — T148XXA Other injury of unspecified body region, initial encounter: Secondary | ICD-10-CM | POA: Diagnosis not present

## 2021-04-14 DIAGNOSIS — Z20822 Contact with and (suspected) exposure to covid-19: Secondary | ICD-10-CM | POA: Diagnosis not present

## 2021-04-15 ENCOUNTER — Ambulatory Visit (INDEPENDENT_AMBULATORY_CARE_PROVIDER_SITE_OTHER): Payer: BC Managed Care – PPO | Admitting: Family Medicine

## 2021-04-15 ENCOUNTER — Other Ambulatory Visit: Payer: Self-pay

## 2021-04-15 ENCOUNTER — Ambulatory Visit (INDEPENDENT_AMBULATORY_CARE_PROVIDER_SITE_OTHER): Payer: BC Managed Care – PPO | Admitting: Sports Medicine

## 2021-04-15 VITALS — BP 120/60 | HR 65 | Temp 98.5°F | Wt 145.6 lb

## 2021-04-15 VITALS — BP 100/70 | HR 72 | Ht 64.0 in | Wt 148.0 lb

## 2021-04-15 DIAGNOSIS — M9902 Segmental and somatic dysfunction of thoracic region: Secondary | ICD-10-CM

## 2021-04-15 DIAGNOSIS — M9903 Segmental and somatic dysfunction of lumbar region: Secondary | ICD-10-CM | POA: Diagnosis not present

## 2021-04-15 DIAGNOSIS — M9908 Segmental and somatic dysfunction of rib cage: Secondary | ICD-10-CM

## 2021-04-15 DIAGNOSIS — M9901 Segmental and somatic dysfunction of cervical region: Secondary | ICD-10-CM

## 2021-04-15 DIAGNOSIS — J0111 Acute recurrent frontal sinusitis: Secondary | ICD-10-CM

## 2021-04-15 DIAGNOSIS — M546 Pain in thoracic spine: Secondary | ICD-10-CM

## 2021-04-15 DIAGNOSIS — G8929 Other chronic pain: Secondary | ICD-10-CM | POA: Diagnosis not present

## 2021-04-15 DIAGNOSIS — M9905 Segmental and somatic dysfunction of pelvic region: Secondary | ICD-10-CM

## 2021-04-15 MED ORDER — AMOXICILLIN-POT CLAVULANATE 875-125 MG PO TABS: 1.0000 | ORAL_TABLET | Freq: Two times a day (BID) | ORAL | 0 refills | Status: DC

## 2021-04-15 NOTE — Progress Notes (Signed)
Established Patient Office Visit  Subjective:  Patient ID: Gabriela Shepard, female    DOB: 1960/09/27  Age: 60 y.o. MRN: [id number]  CC:  Chief Complaint  Patient presents with   Sinus Problem    Sinus pressure, headaches, runny nose, fatigue     HPI [name] presents for sinus pressure bifrontal along with fatigue and nasal congestion.  Onset Friday.  History of recurrent sinusitis in the past.  No purulent secretions.  No bloody nasal discharge.  No cough.  Home COVID test negative.  She does have history of seasonal allergies and uses Flonase regularly.  Also on hydroxyzine for her bladder.  She has history of interstitial cystitis.  She does not think this is allergy related.  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   History of UTI    Interstitial cystitis    Low sodium levels    Migraines    Osteoporosis    PONV (postoperative nausea and vomiting)    PTSD (post-traumatic stress disorder)    on meds   STD (sexually transmitted disease)    Substance abuse ([place])    hx of alcohol   Urine incontinence     Past Surgical History:  Procedure Laterality Date   BREAST BIOPSY Left    2017 benign x 2   CESAREAN SECTION  1992   COLONOSCOPY  2018   JMP-MAC-suprep(good)-polyps   INCONTINENCE SURGERY     LAPAROSCOPY     under bladder   NECK SURGERY  02/24/2018   ACDF   POLYPECTOMY  2018   polyps removed   TOTAL HIP ARTHROPLASTY Right 09/08/2020   TOTAL HIP ARTHROPLASTY Left 2018   TOTAL KNEE ARTHROPLASTY Left 12/23/2015   Procedure: TOTAL KNEE ARTHROPLASTY;  Surgeon: Melrose Nakayama, MD;  Location: [facility];  Service: Orthopedics;  Laterality:  Left;    Family History  Problem Relation Age of Onset   Hypertension Mother    Lung cancer Mother    Kidney cancer Father    Ulcers Father    Heart disease Father    Drug abuse Sister    Alcoholism Brother    Hypertension Maternal Grandmother    Alcohol abuse Other    Arthritis Other    Hypertension Other    Mental illness Other    Healthy Daughter    Healthy Son    Colon cancer Neg Hx    Esophageal cancer Neg Hx    Colon polyps Neg Hx    Rectal cancer Neg Hx    Stomach cancer Neg Hx     Social History   Socioeconomic History   Marital status: Divorced    Spouse name: Not on file   Number of children: Not on file   Years of education: Not on file   Highest education level: Not on file  Occupational History   Not on file  Tobacco Use   Smoking status: Former    Packs/day: 0.50    Years: 10.00    Pack years: 5.00    Types: Cigarettes  Quit date: 11/13/2010    Years since quitting: 10.4   Smokeless tobacco: Never  Vaping Use   Vaping Use: Never used  Substance and Sexual Activity   Alcohol use: No    Comment: hx of ETOH abuse-sober since 11/2010    Drug use: No   Sexual activity: Not Currently    Birth control/protection: Post-menopausal  Other Topics Concern   Not on file  Social History Narrative   Not on file   Social Determinants of Health   Financial Resource Strain: Not on file  Food Insecurity: Not on file  Transportation Needs: Not on file  Physical Activity: Not on file  Stress: Not on file  Social Connections: Not on file  Intimate Partner Violence: Not on file    Outpatient Medications Prior to Visit  Medication Sig Dispense Refill   acetaminophen-codeine (TYLENOL #3) 300-30 MG tablet Take 1 tablet by mouth every 6 (six) hours as needed. 20 tablet 0   AIMOVIG 70 MG/ML SOAJ INJECT 70 MG INTO THE SKIN EVERY 30 (THIRTY) DAYS. 3 mL 1   alendronate-cholecalciferol (FOSAMAX PLUS D) 70-2800 MG-UNIT tablet Take 1 tablet by mouth every 7  (seven) days. Take with a full glass of water on an empty stomach.     Cholecalciferol (VITAMIN D3) 1.25 MG (50000 UT) CAPS TAKE 1 CAPSULE BY MOUTH ONE TIME PER WEEK (Patient taking differently: every 14 (fourteen) days.) 12 capsule 3   clonazePAM (KLONOPIN) 1 MG tablet Take 1 tablet (1 mg total) by mouth 2 (two) times daily. 60 tablet 3   clotrimazole-betamethasone (LOTRISONE) cream APPLY TO AFFECTED AREA TWICE A DAY (Patient taking differently: 2 (two) times daily as needed.) 30 g 2   cyclobenzaprine (FLEXERIL) 10 MG tablet Take 1 tablet by mouth every 8 (eight) hours as needed.     escitalopram (LEXAPRO) 10 MG tablet TAKE 1 TABLET BY MOUTH EVERY DAY 90 tablet 2   esomeprazole (NEXIUM) 40 MG capsule TAKE 1 CAPSULE BY MOUTH EVERY DAY (Patient taking differently: daily as needed.) 90 capsule 3   Estradiol 10 MCG TABS vaginal tablet Place 1 tablet (10 mcg total) vaginally 2 (two) times a week. 8 tablet 10   fluticasone (FLONASE) 50 MCG/ACT nasal spray Place 2 sprays into both nostrils daily. 16 g 11   hydrocortisone (ANUSOL-HC) 2.5 % rectal cream Place rectally 2 (two) times daily. Use as needed for hemorrhoids. 30 g 1   hydrOXYzine (ATARAX/VISTARIL) 25 MG tablet Take 1 tablet (25 mg total) by mouth at bedtime. (Patient taking differently: Take 25 mg by mouth at bedtime as needed.) 30 tablet 5   meloxicam (MOBIC) 7.5 MG tablet TAKE 1 TABLET BY MOUTH EVERY DAY 30 tablet 0   Multiple Vitamin (MULTIVITAMIN WITH MINERALS) TABS tablet Take 1 tablet by mouth daily.     mupirocin ointment (BACTROBAN) 2 % Apply 1 application topically 2 (two) times daily. 22 g 0   ondansetron (ZOFRAN ODT) 4 MG disintegrating tablet Take 1 tablet (4 mg total) by mouth every 8 (eight) hours as needed for nausea or vomiting. 20 tablet 0   phenazopyridine (PYRIDIUM) 200 MG tablet Take 1 tablet (200 mg total) by mouth 3 (three) times daily as needed. 6 tablet 0   predniSONE (DELTASONE) 20 MG tablet Take 1 tablet (20 mg total) by  mouth daily with breakfast. 7 tablet 0   SUMAtriptan (IMITREX) 100 MG tablet TAKE 1 TABLET BY MOUTH AS NEEDED FOR MIGRAINE HEADACHE, MAY REPEAT ONCE IN TWO HOURS AS NEEDED BUT  NO MORE THAN 2 IN 24 HOURS. 9 tablet 4   traZODone (DESYREL) 50 MG tablet Take 0.5-1 tablets (25-50 mg total) by mouth at bedtime as needed for sleep. 30 tablet 3   valACYclovir (VALTREX) 500 MG tablet Take one tablet po bid x 3 days as needed. 30 tablet 3   No facility-administered medications prior to visit.    Allergies  Allergen Reactions   Sulfa Antibiotics Hives    ROS Review of Systems  Constitutional:  Negative for chills and fever.  HENT:  Positive for congestion, sinus pressure and sinus pain.   Respiratory:  Negative for cough and shortness of breath.   Neurological:  Positive for headaches.     Objective:    Physical Exam Vitals reviewed.  HENT:     Right Ear: Tympanic membrane normal.     Left Ear: Tympanic membrane normal.  Cardiovascular:     Rate and Rhythm: Normal rate.  Pulmonary:     Effort: Pulmonary effort is normal.     Breath sounds: Normal breath sounds. No wheezing or rales.  Neurological:     Mental Status: She is alert.    BP 120/60 (BP Location: Left Arm, Patient Position: Sitting, Cuff Size: Normal)   Pulse 65   Temp 98.5 F (36.9 C) (Oral)   Wt 145 lb 9.6 oz (66 kg)   LMP 06/07/2010 (Approximate)   SpO2 100%   BMI 24.99 kg/m  Wt Readings from Last 3 Encounters:  04/15/21 145 lb 9.6 oz (66 kg)  04/01/21 145 lb (65.8 kg)  03/24/21 144 lb 12.8 oz (65.7 kg)     Health Maintenance Due  Topic Date Due   COVID-19 Vaccine (1) Never done   Pneumococcal Vaccine 16-39 Years old (1 - PCV) Never done   HIV Screening  Never done   INFLUENZA VACCINE  01/05/2021   PAP SMEAR-Modifier  04/18/2021    There are no preventive care reminders to display for this patient.  Lab Results  Component Value Date   TSH 1.78 03/03/2021   Lab Results  Component Value Date   WBC  5.2 03/03/2021   HGB 11.9 (L) 03/03/2021   HCT 36.5 03/03/2021   MCV 88.5 03/03/2021   PLT 296.0 03/03/2021   Lab Results  Component Value Date   NA 132 (L) 03/03/2021   K 3.9 03/03/2021   CO2 27 03/03/2021   GLUCOSE 90 03/03/2021   BUN 15 03/03/2021   CREATININE 0.98 03/03/2021   BILITOT 0.4 03/03/2021   ALKPHOS 43 03/03/2021   AST 18 03/03/2021   ALT 14 03/03/2021   PROT 6.6 03/03/2021   ALBUMIN 3.9 03/03/2021   CALCIUM 8.9 03/03/2021   CALCIUM 8.8 03/03/2021   ANIONGAP 12 09/26/2019   GFR 62.87 03/03/2021   Lab Results  Component Value Date   CHOL 180 10/30/2020   Lab Results  Component Value Date   HDL 75.40 10/30/2020   Lab Results  Component Value Date   LDLCALC 96 10/30/2020   Lab Results  Component Value Date   TRIG 39.0 10/30/2020   Lab Results  Component Value Date   CHOLHDL 2 10/30/2020   Lab Results  Component Value Date   HGBA1C 5.9 05/02/2017      Assessment & Plan:   Probable acute frontal sinusitis, recurrent  Start Augmentin 875 mg twice daily with food.  Plenty fluids.  Touch base for any persistent or worsening symptoms.  Meds ordered this encounter  Medications   amoxicillin-clavulanate (AUGMENTIN) 875-125  MG tablet    Sig: Take 1 tablet by mouth 2 (two) times daily.    Dispense:  20 tablet    Refill:  0    Follow-up: No follow-ups on file.    Carolann Littler, MD

## 2021-04-15 NOTE — Progress Notes (Signed)
Gabriela Shepard D.[facility] Phone: [phone]   Assessment and Plan:     1. Chronic bilateral thoracic back pain 2. Somatic dysfunction of cervical region 3. Somatic dysfunction of thoracic region 4. Somatic dysfunction of lumbar region 5. Somatic dysfunction of pelvic region 6. Somatic dysfunction of rib region -Chronic with exacerbation, subsequent sports medicine visit - Moderate relief of pain after OMT at previous visit, however patient felt recurrence of pain over the past several days and is requesting additional OMT - Start HEP for scapular exercises - Patient has received significant relief with OMT in the past.  Elects for repeat OMT today.  Tolerated well per note below. - HVLA was not performed on neck due to history of fusion - Decision today to treat with OMT was based on Physical Exam   After verbal consent patient was treated with HVLA (high velocity low amplitude), ME (muscle energy), FPR (flex positional release), ST (soft tissue), PC/PD (Pelvic Compression/ Pelvic Decompression) techniques in cervical, rib, thoracic, lumbar, and pelvic areas. Patient tolerated the procedure well with improvement in symptoms.  Patient educated on potential side effects of soreness and recommended to rest, hydrate, and use Tylenol as needed for pain control.   Pertinent previous records reviewed include none   Follow Up: 1 week for repeat OMT   Subjective:   I, Gabriela Shepard, am serving as a scribe for Dr. [name]  Chief Complaint: Back and neck pain   HPI:   04/15/21 Patient is a 60 year old female presenting with back pain. Patient was last seen by Dr. [name] on 04/01/21 and had OMT. Today patient states that her neck is causing her some tension and pain. Patient is experiencing right sided thoracic pain that radiates to the front of the chest. Patient thinks her rib slipped out again.   Relevant Historical  Information:  history of C-spine fusion  Additional pertinent review of systems negative.  Current Outpatient Medications  Medication Sig Dispense Refill   acetaminophen-codeine (TYLENOL #3) 300-30 MG tablet Take 1 tablet by mouth every 6 (six) hours as needed. 20 tablet 0   AIMOVIG 70 MG/ML SOAJ INJECT 70 MG INTO THE SKIN EVERY 30 (THIRTY) DAYS. 3 mL 1   alendronate-cholecalciferol (FOSAMAX PLUS D) 70-2800 MG-UNIT tablet Take 1 tablet by mouth every 7 (seven) days. Take with a full glass of water on an empty stomach.     amoxicillin-clavulanate (AUGMENTIN) 875-125 MG tablet Take 1 tablet by mouth 2 (two) times daily. 20 tablet 0   Cholecalciferol (VITAMIN D3) 1.25 MG (50000 UT) CAPS TAKE 1 CAPSULE BY MOUTH ONE TIME PER WEEK (Patient taking differently: every 14 (fourteen) days.) 12 capsule 3   clonazePAM (KLONOPIN) 1 MG tablet Take 1 tablet (1 mg total) by mouth 2 (two) times daily. 60 tablet 3   clotrimazole-betamethasone (LOTRISONE) cream APPLY TO AFFECTED AREA TWICE A DAY (Patient taking differently: 2 (two) times daily as needed.) 30 g 2   cyclobenzaprine (FLEXERIL) 10 MG tablet Take 1 tablet by mouth every 8 (eight) hours as needed.     escitalopram (LEXAPRO) 10 MG tablet TAKE 1 TABLET BY MOUTH EVERY DAY 90 tablet 2   esomeprazole (NEXIUM) 40 MG capsule TAKE 1 CAPSULE BY MOUTH EVERY DAY (Patient taking differently: daily as needed.) 90 capsule 3   Estradiol 10 MCG TABS vaginal tablet Place 1 tablet (10 mcg total) vaginally 2 (two) times a week. 8 tablet 10  fluticasone (FLONASE) 50 MCG/ACT nasal spray Place 2 sprays into both nostrils daily. 16 g 11   hydrocortisone (ANUSOL-HC) 2.5 % rectal cream Place rectally 2 (two) times daily. Use as needed for hemorrhoids. 30 g 1   hydrOXYzine (ATARAX/VISTARIL) 25 MG tablet Take 1 tablet (25 mg total) by mouth at bedtime. (Patient taking differently: Take 25 mg by mouth at bedtime as needed.) 30 tablet 5   meloxicam (MOBIC) 7.5 MG tablet TAKE 1  TABLET BY MOUTH EVERY DAY 30 tablet 0   Multiple Vitamin (MULTIVITAMIN WITH MINERALS) TABS tablet Take 1 tablet by mouth daily.     mupirocin ointment (BACTROBAN) 2 % Apply 1 application topically 2 (two) times daily. 22 g 0   ondansetron (ZOFRAN ODT) 4 MG disintegrating tablet Take 1 tablet (4 mg total) by mouth every 8 (eight) hours as needed for nausea or vomiting. 20 tablet 0   phenazopyridine (PYRIDIUM) 200 MG tablet Take 1 tablet (200 mg total) by mouth 3 (three) times daily as needed. 6 tablet 0   predniSONE (DELTASONE) 20 MG tablet Take 1 tablet (20 mg total) by mouth daily with breakfast. 7 tablet 0   SUMAtriptan (IMITREX) 100 MG tablet TAKE 1 TABLET BY MOUTH AS NEEDED FOR MIGRAINE HEADACHE, MAY REPEAT ONCE IN TWO HOURS AS NEEDED BUT NO MORE THAN 2 IN 24 HOURS. 9 tablet 4   traZODone (DESYREL) 50 MG tablet Take 0.5-1 tablets (25-50 mg total) by mouth at bedtime as needed for sleep. 30 tablet 3   valACYclovir (VALTREX) 500 MG tablet Take one tablet po bid x 3 days as needed. 30 tablet 3   No current facility-administered medications for this visit.      Objective:     Vitals:   04/15/21 1559  BP: 100/70  Pulse: 72  SpO2: 97%  Weight: 148 lb (67.1 kg)  Height: 5\' 4"  (1.626 m)      Body mass index is 25.4 kg/m.    Physical Exam:     General: Well-appearing, cooperative, sitting comfortably in no acute distress.   OMT Physical Exam:   ASIS Compression Test: Positive Right Cervical: TTP paraspinal, significantly limited rotation sidebending and lower cervical spine with history of C-spine fusion Rib: Bilateral elevated first rib with TTP Thoracic: TTP paraspinal, T4-6 RRSL Lumbar: TTP paraspinal, T10-L2 RRSL  Electronically signed by:  Gabriela Shepard D.[facility] 4:36 PM 04/15/21

## 2021-04-15 NOTE — Patient Instructions (Addendum)
Good to see you  Exercises given scapular See you next Thursday

## 2021-04-22 ENCOUNTER — Ambulatory Visit (INDEPENDENT_AMBULATORY_CARE_PROVIDER_SITE_OTHER): Payer: BC Managed Care – PPO | Admitting: Family Medicine

## 2021-04-22 VITALS — BP 120/60 | HR 65 | Temp 97.8°F | Wt 146.4 lb

## 2021-04-22 DIAGNOSIS — K219 Gastro-esophageal reflux disease without esophagitis: Secondary | ICD-10-CM | POA: Diagnosis not present

## 2021-04-22 MED ORDER — HYOSCYAMINE SULFATE ER 0.375 MG PO TB12: 0.3750 mg | ORAL_TABLET | Freq: Two times a day (BID) | ORAL | 0 refills | Status: DC | PRN

## 2021-04-22 MED ORDER — ONDANSETRON 4 MG PO TBDP: 4.0000 mg | ORAL_TABLET | Freq: Three times a day (TID) | ORAL | 0 refills | Status: DC | PRN

## 2021-04-22 NOTE — Progress Notes (Signed)
Established Patient Office Visit  Subjective:  Patient ID: Gabriela Shepard, female    DOB: 1960-08-05  Age: 60 y.o. MRN: [id number]  CC:  Chief Complaint  Patient presents with   Abdominal Pain    HPI Gabriela Shepard presents for some recent dyspepsia.  She states for about 6 months she had a tender "spot " in the epigastric region.  Symptoms somewhat intermittent.  Had recent CT abdomen pelvis per sports medicine which showed no acute findings.  She does have fairly frequent heartburn symptoms especially with things like pineapple.  She has taken Nexium in the past but not consistently.  Last weekend she has some mild body aches and headaches and intermittent abdominal cramps.  No significant diarrhea.  Has taken high Cosamin in the past for similar symptoms which helped.  Requesting refill.  Occasional sensation of food hanging up in her lower esophageal region but not consistently.  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   History of UTI    Interstitial cystitis    Low sodium levels    Migraines    Osteoporosis    PONV (postoperative nausea and vomiting)    PTSD (post-traumatic stress disorder)    on meds   STD (sexually transmitted disease)    Substance abuse ([place])    hx of alcohol   Urine incontinence     Past Surgical History:  Procedure Laterality Date   BREAST BIOPSY Left    2017 benign x 2   CESAREAN SECTION  1992   COLONOSCOPY  2018   JMP-MAC-suprep(good)-polyps   INCONTINENCE SURGERY     LAPAROSCOPY     under bladder   NECK SURGERY   02/24/2018   ACDF   POLYPECTOMY  2018   polyps removed   TOTAL HIP ARTHROPLASTY Right 09/08/2020   TOTAL HIP ARTHROPLASTY Left 2018   TOTAL KNEE ARTHROPLASTY Left 12/23/2015   Procedure: TOTAL KNEE ARTHROPLASTY;  Surgeon: Melrose Nakayama, MD;  Location: [facility];  Service: Orthopedics;  Laterality: Left;    Family History  Problem Relation Age of Onset   Hypertension Mother    Lung cancer Mother    Kidney cancer Father    Ulcers Father    Heart disease Father    Drug abuse Sister    Alcoholism Brother    Hypertension Maternal Grandmother    Alcohol abuse Other    Arthritis Other    Hypertension Other    Mental illness Other    Healthy Daughter    Healthy Son    Colon cancer Neg Hx    Esophageal cancer Neg Hx    Colon polyps Neg Hx    Rectal cancer Neg Hx    Stomach cancer Neg Hx     Social History   Socioeconomic History   Marital status: Divorced    Spouse name: Not on file   Number of children: Not on file   Years of education: Not on file   Highest  education level: Not on file  Occupational History   Not on file  Tobacco Use   Smoking status: Former    Packs/day: 0.50    Years: 10.00    Pack years: 5.00    Types: Cigarettes    Quit date: 11/13/2010    Years since quitting: 10.4   Smokeless tobacco: Never  Vaping Use   Vaping Use: Never used  Substance and Sexual Activity   Alcohol use: No    Comment: hx of ETOH abuse-sober since 11/2010    Drug use: No   Sexual activity: Not Currently    Birth control/protection: Post-menopausal  Other Topics Concern   Not on file  Social History Narrative   Not on file   Social Determinants of Health   Financial Resource Strain: Not on file  Food Insecurity: Not on file  Transportation Needs: Not on file  Physical Activity: Not on file  Stress: Not on file  Social Connections: Not on file  Intimate Partner Violence: Not on file    Outpatient Medications Prior to Visit  Medication Sig Dispense Refill    acetaminophen-codeine (TYLENOL #3) 300-30 MG tablet Take 1 tablet by mouth every 6 (six) hours as needed. 20 tablet 0   AIMOVIG 70 MG/ML SOAJ INJECT 70 MG INTO THE SKIN EVERY 30 (THIRTY) DAYS. 3 mL 1   amoxicillin-clavulanate (AUGMENTIN) 875-125 MG tablet Take 1 tablet by mouth 2 (two) times daily. 20 tablet 0   Cholecalciferol (VITAMIN D3) 1.25 MG (50000 UT) CAPS TAKE 1 CAPSULE BY MOUTH ONE TIME PER WEEK (Patient taking differently: every 14 (fourteen) days.) 12 capsule 3   clonazePAM (KLONOPIN) 1 MG tablet Take 1 tablet (1 mg total) by mouth 2 (two) times daily. 60 tablet 3   clotrimazole-betamethasone (LOTRISONE) cream APPLY TO AFFECTED AREA TWICE A DAY (Patient taking differently: 2 (two) times daily as needed.) 30 g 2   cyclobenzaprine (FLEXERIL) 10 MG tablet Take 1 tablet by mouth every 8 (eight) hours as needed.     escitalopram (LEXAPRO) 10 MG tablet TAKE 1 TABLET BY MOUTH EVERY DAY 90 tablet 2   esomeprazole (NEXIUM) 40 MG capsule TAKE 1 CAPSULE BY MOUTH EVERY DAY (Patient taking differently: daily as needed.) 90 capsule 3   Estradiol 10 MCG TABS vaginal tablet Place 1 tablet (10 mcg total) vaginally 2 (two) times a week. 8 tablet 10   fluticasone (FLONASE) 50 MCG/ACT nasal spray Place 2 sprays into both nostrils daily. 16 g 11   hydrocortisone (ANUSOL-HC) 2.5 % rectal cream Place rectally 2 (two) times daily. Use as needed for hemorrhoids. 30 g 1   hydrOXYzine (ATARAX/VISTARIL) 25 MG tablet Take 1 tablet (25 mg total) by mouth at bedtime. (Patient taking differently: Take 25 mg by mouth at bedtime as needed.) 30 tablet 5   meloxicam (MOBIC) 7.5 MG tablet TAKE 1 TABLET BY MOUTH EVERY DAY 30 tablet 0   Multiple Vitamin (MULTIVITAMIN WITH MINERALS) TABS tablet Take 1 tablet by mouth daily.     mupirocin ointment (BACTROBAN) 2 % Apply 1 application topically 2 (two) times daily. 22 g 0   phenazopyridine (PYRIDIUM) 200 MG tablet Take 1 tablet (200 mg total) by mouth 3 (three) times daily as  needed. 6 tablet 0   predniSONE (DELTASONE) 20 MG tablet Take 1 tablet (20 mg total) by mouth daily with breakfast. 7 tablet 0   SUMAtriptan (IMITREX) 100 MG tablet TAKE 1 TABLET BY MOUTH AS NEEDED FOR MIGRAINE HEADACHE, MAY REPEAT ONCE IN TWO HOURS  AS NEEDED BUT NO MORE THAN 2 IN 24 HOURS. 9 tablet 4   traZODone (DESYREL) 50 MG tablet Take 0.5-1 tablets (25-50 mg total) by mouth at bedtime as needed for sleep. 30 tablet 3   valACYclovir (VALTREX) 500 MG tablet Take one tablet po bid x 3 days as needed. 30 tablet 3   alendronate-cholecalciferol (FOSAMAX PLUS D) 70-2800 MG-UNIT tablet Take 1 tablet by mouth every 7 (seven) days. Take with a full glass of water on an empty stomach.     ondansetron (ZOFRAN ODT) 4 MG disintegrating tablet Take 1 tablet (4 mg total) by mouth every 8 (eight) hours as needed for nausea or vomiting. 20 tablet 0   No facility-administered medications prior to visit.    Allergies  Allergen Reactions   Sulfa Antibiotics Hives    ROS Review of Systems  Constitutional:  Negative for appetite change and unexpected weight change.  Respiratory:  Negative for cough and shortness of breath.   Cardiovascular:  Negative for chest pain.  Gastrointestinal:  Negative for diarrhea and vomiting.     Objective:    Physical Exam Vitals reviewed.  Cardiovascular:     Rate and Rhythm: Normal rate and regular rhythm.  Pulmonary:     Effort: Pulmonary effort is normal.     Breath sounds: Normal breath sounds.  Abdominal:     General: Bowel sounds are normal.     Comments: Abdomen nondistended.  Minimal tenderness epigastric region.  No guarding or rebound.  No masses palpated.  Neurological:     Mental Status: She is alert.    BP 120/60 (BP Location: Left Arm, Patient Position: Sitting, Cuff Size: Normal)   Pulse 65   Temp 97.8 F (36.6 C) (Oral)   Wt 146 lb 6.4 oz (66.4 kg)   LMP 06/07/2010 (Approximate)   SpO2 99%   BMI 25.13 kg/m  Wt Readings from Last 3  Encounters:  04/22/21 146 lb 6.4 oz (66.4 kg)  04/15/21 148 lb (67.1 kg)  04/15/21 145 lb 9.6 oz (66 kg)     Health Maintenance Due  Topic Date Due   COVID-19 Vaccine (1) Never done   Pneumococcal Vaccine 33-34 Years old (1 - PCV) Never done   HIV Screening  Never done   INFLUENZA VACCINE  01/05/2021   PAP SMEAR-Modifier  04/18/2021    There are no preventive care reminders to display for this patient.  Lab Results  Component Value Date   TSH 1.78 03/03/2021   Lab Results  Component Value Date   WBC 5.2 03/03/2021   HGB 11.9 (L) 03/03/2021   HCT 36.5 03/03/2021   MCV 88.5 03/03/2021   PLT 296.0 03/03/2021   Lab Results  Component Value Date   NA 132 (L) 03/03/2021   K 3.9 03/03/2021   CO2 27 03/03/2021   GLUCOSE 90 03/03/2021   BUN 15 03/03/2021   CREATININE 0.98 03/03/2021   BILITOT 0.4 03/03/2021   ALKPHOS 43 03/03/2021   AST 18 03/03/2021   ALT 14 03/03/2021   PROT 6.6 03/03/2021   ALBUMIN 3.9 03/03/2021   CALCIUM 8.9 03/03/2021   CALCIUM 8.8 03/03/2021   ANIONGAP 12 09/26/2019   GFR 62.87 03/03/2021   Lab Results  Component Value Date   CHOL 180 10/30/2020   Lab Results  Component Value Date   HDL 75.40 10/30/2020   Lab Results  Component Value Date   LDLCALC 96 10/30/2020   Lab Results  Component Value Date   TRIG 39.0  10/30/2020   Lab Results  Component Value Date   CHOLHDL 2 10/30/2020   Lab Results  Component Value Date   HGBA1C 5.9 05/02/2017      Assessment & Plan:   Increased GERD symptoms recently.  Discussed dietary modification.  Handout given.  Get back on regular Nexium for the next month.  Then consider transitioning down to Pepcid 20 mg twice daily (if symptoms improving). Be in touch if symptoms not improving with the above.  Follow-up immediately for any recurrent vomiting, dysphagia, or any progressive abdominal pain  Meds ordered this encounter  Medications   ondansetron (ZOFRAN ODT) 4 MG disintegrating tablet     Sig: Take 1 tablet (4 mg total) by mouth every 8 (eight) hours as needed for nausea or vomiting.    Dispense:  20 tablet    Refill:  0   hyoscyamine (LEVBID) 0.375 MG 12 hr tablet    Sig: Take 1 tablet (0.375 mg total) by mouth every 12 (twelve) hours as needed.    Dispense:  30 tablet    Refill:  0    Follow-up: No follow-ups on file.    Carolann Littler, MD

## 2021-04-22 NOTE — Patient Instructions (Signed)
Try taking the Nexium one hour prior to biggest meal of day  May supplement with Pepcid 20 mg twice daily  If symptoms not improving in couple of weeks let me know

## 2021-04-23 ENCOUNTER — Ambulatory Visit (INDEPENDENT_AMBULATORY_CARE_PROVIDER_SITE_OTHER): Payer: BC Managed Care – PPO | Admitting: Sports Medicine

## 2021-04-23 ENCOUNTER — Ambulatory Visit: Payer: BC Managed Care – PPO | Admitting: Family Medicine

## 2021-04-23 ENCOUNTER — Other Ambulatory Visit: Payer: Self-pay

## 2021-04-23 ENCOUNTER — Ambulatory Visit: Payer: BC Managed Care – PPO | Admitting: Sports Medicine

## 2021-04-23 VITALS — BP 120/82 | HR 58 | Ht 64.0 in | Wt 146.0 lb

## 2021-04-23 DIAGNOSIS — M9902 Segmental and somatic dysfunction of thoracic region: Secondary | ICD-10-CM

## 2021-04-23 DIAGNOSIS — M9901 Segmental and somatic dysfunction of cervical region: Secondary | ICD-10-CM | POA: Diagnosis not present

## 2021-04-23 DIAGNOSIS — M9903 Segmental and somatic dysfunction of lumbar region: Secondary | ICD-10-CM | POA: Diagnosis not present

## 2021-04-23 DIAGNOSIS — M9905 Segmental and somatic dysfunction of pelvic region: Secondary | ICD-10-CM

## 2021-04-23 DIAGNOSIS — M9908 Segmental and somatic dysfunction of rib cage: Secondary | ICD-10-CM

## 2021-04-23 NOTE — Progress Notes (Signed)
Benito Mccreedy D.[place] Phone: [phone]   Assessment and Plan:     1. Somatic dysfunction of cervical region 2. Somatic dysfunction of thoracic region 3. Somatic dysfunction of lumbar region 4. Somatic dysfunction of pelvic region 5. Somatic dysfunction of rib region -Chronic with exacerbation, subsequent visit -Recurrence of typical musculoskeletal complaints, overall improved with recent OMT.  Most significant being thoracic and neck pain - Patient has received significant relief with OMT in the past.  Elects for repeat OMT today.  Tolerated well per note below. - Decision today to treat with OMT was based on Physical Exam   After verbal consent patient was treated with HVLA (high velocity low amplitude), ME (muscle energy), FPR (flex positional release), ST (soft tissue), PC/PD (Pelvic Compression/ Pelvic Decompression) techniques in cervical, rib, thoracic, lumbar, and pelvic areas. Patient tolerated the procedure well with improvement in symptoms.  Patient educated on potential side effects of soreness and recommended to rest, hydrate, and use Tylenol as needed for pain control.   Pertinent previous records reviewed include none   Follow Up: 1-2 weeks for repeat OMT     Subjective:   I, [name], am serving as a scribe for Dr. [name]  Chief Complaint: Neck and back pain   HPI:   04/15/21 Patient is a 60 year old female presenting with back pain. Patient was last seen by Dr. [name] on 04/01/21 and had OMT. Today patient states that her neck is causing her some tension and pain. Patient is experiencing right sided thoracic pain that radiates to the front of the chest. Patient thinks her rib slipped out again.   04/23/21 Patient states that she is having some pain in her left foot with in the last week. Patient locates pain to the top of left foot/ankle. Worse when walking or standing. Patient  states that her back/neck have been doing a lot better, just last night the right shoulder blade area started to flare up again  Relevant Historical Information: history of C-spine fusion  Additional pertinent review of systems negative.  Current Outpatient Medications  Medication Sig Dispense Refill   acetaminophen-codeine (TYLENOL #3) 300-30 MG tablet Take 1 tablet by mouth every 6 (six) hours as needed. 20 tablet 0   AIMOVIG 70 MG/ML SOAJ INJECT 70 MG INTO THE SKIN EVERY 30 (THIRTY) DAYS. 3 mL 1   amoxicillin-clavulanate (AUGMENTIN) 875-125 MG tablet Take 1 tablet by mouth 2 (two) times daily. 20 tablet 0   Cholecalciferol (VITAMIN D3) 1.25 MG (50000 UT) CAPS TAKE 1 CAPSULE BY MOUTH ONE TIME PER WEEK (Patient taking differently: every 14 (fourteen) days.) 12 capsule 3   clonazePAM (KLONOPIN) 1 MG tablet Take 1 tablet (1 mg total) by mouth 2 (two) times daily. 60 tablet 3   clotrimazole-betamethasone (LOTRISONE) cream APPLY TO AFFECTED AREA TWICE A DAY (Patient taking differently: 2 (two) times daily as needed.) 30 g 2   cyclobenzaprine (FLEXERIL) 10 MG tablet Take 1 tablet by mouth every 8 (eight) hours as needed.     escitalopram (LEXAPRO) 10 MG tablet TAKE 1 TABLET BY MOUTH EVERY DAY 90 tablet 2   esomeprazole (NEXIUM) 40 MG capsule TAKE 1 CAPSULE BY MOUTH EVERY DAY (Patient taking differently: daily as needed.) 90 capsule 3   Estradiol 10 MCG TABS vaginal tablet Place 1 tablet (10 mcg total) vaginally 2 (two) times a week. 8 tablet 10   fluticasone (FLONASE) 50 MCG/ACT nasal spray Place  2 sprays into both nostrils daily. 16 g 11   hydrocortisone (ANUSOL-HC) 2.5 % rectal cream Place rectally 2 (two) times daily. Use as needed for hemorrhoids. 30 g 1   hydrOXYzine (ATARAX/VISTARIL) 25 MG tablet Take 1 tablet (25 mg total) by mouth at bedtime. (Patient taking differently: Take 25 mg by mouth at bedtime as needed.) 30 tablet 5   hyoscyamine (LEVBID) 0.375 MG 12 hr tablet Take 1 tablet (0.375  mg total) by mouth every 12 (twelve) hours as needed. 30 tablet 0   meloxicam (MOBIC) 7.5 MG tablet TAKE 1 TABLET BY MOUTH EVERY DAY 30 tablet 0   Multiple Vitamin (MULTIVITAMIN WITH MINERALS) TABS tablet Take 1 tablet by mouth daily.     mupirocin ointment (BACTROBAN) 2 % Apply 1 application topically 2 (two) times daily. 22 g 0   ondansetron (ZOFRAN ODT) 4 MG disintegrating tablet Take 1 tablet (4 mg total) by mouth every 8 (eight) hours as needed for nausea or vomiting. 20 tablet 0   phenazopyridine (PYRIDIUM) 200 MG tablet Take 1 tablet (200 mg total) by mouth 3 (three) times daily as needed. 6 tablet 0   predniSONE (DELTASONE) 20 MG tablet Take 1 tablet (20 mg total) by mouth daily with breakfast. 7 tablet 0   SUMAtriptan (IMITREX) 100 MG tablet TAKE 1 TABLET BY MOUTH AS NEEDED FOR MIGRAINE HEADACHE, MAY REPEAT ONCE IN TWO HOURS AS NEEDED BUT NO MORE THAN 2 IN 24 HOURS. 9 tablet 4   traZODone (DESYREL) 50 MG tablet Take 0.5-1 tablets (25-50 mg total) by mouth at bedtime as needed for sleep. 30 tablet 3   valACYclovir (VALTREX) 500 MG tablet Take one tablet po bid x 3 days as needed. 30 tablet 3   No current facility-administered medications for this visit.      Objective:     Vitals:   04/23/21 1118  BP: 120/82  Pulse: (!) 58  SpO2: 99%  Weight: 146 lb (66.2 kg)  Height: 5\' 4"  (1.626 m)      Body mass index is 25.06 kg/m.    Physical Exam:     General: Well-appearing, cooperative, sitting comfortably in no acute distress.   OMT Physical Exam:  ASIS Compression Test: Positive Right Cervical: TTP paraspinal, significantly limited rotation sidebending and lower cervical spine with history of C-spine fusion Rib: Bilateral elevated first rib with TTP Thoracic: TTP paraspinal, T4-6 RRSL Lumbar: TTP paraspinal, T10-L2 RRSL  Electronically signed by:  Benito Mccreedy D.[facility] 12:04 PM 04/23/21

## 2021-04-23 NOTE — Patient Instructions (Signed)
Good to see you   Follow up in 1 weeks for repeat OMT

## 2021-04-27 ENCOUNTER — Other Ambulatory Visit: Payer: Self-pay

## 2021-04-27 ENCOUNTER — Ambulatory Visit: Payer: BC Managed Care – PPO | Admitting: Physical Therapy

## 2021-04-27 ENCOUNTER — Encounter: Payer: Self-pay | Admitting: Family Medicine

## 2021-04-27 MED ORDER — TIZANIDINE HCL 4 MG PO CAPS: 4.0000 mg | ORAL_CAPSULE | Freq: Every day | ORAL | 0 refills | Status: DC

## 2021-04-27 MED ORDER — TIZANIDINE HCL 2 MG PO TABS: 2.0000 mg | ORAL_TABLET | Freq: Every day | ORAL | 0 refills | Status: DC

## 2021-04-28 NOTE — Progress Notes (Signed)
Gabriela Shepard D.[place] Phone: [phone]   Assessment and Plan:     1. Chronic bilateral thoracic back pain 2. Somatic dysfunction of cervical region 3. Somatic dysfunction of thoracic region 4. Somatic dysfunction of lumbar region 5. Somatic dysfunction of pelvic region 6. Somatic dysfunction of rib region -Chronic with exacerbation, subsequent visit - Recurrence of typical musculoskeletal complaints with most prominent being in thoracic spine and neck -No red flag symptoms - Patient has received significant relief with OMT in the past.  Elects for repeat OMT today.  Tolerated well per note below. - Decision today to treat with OMT was based on Physical Exam   After verbal consent patient was treated with HVLA (high velocity low amplitude), ME (muscle energy), FPR (flex positional release), ST (soft tissue), PC/PD (Pelvic Compression/ Pelvic Decompression) techniques in cervical, rib, thoracic, lumbar, and pelvic areas. Patient tolerated the procedure well with improvement in symptoms.  Patient educated on potential side effects of soreness and recommended to rest, hydrate, and use Tylenol as needed for pain control.   Pertinent previous records reviewed include none   Follow Up: 1 to 2 weeks for repeat OMT   Subjective:   I, Gabriela Shepard, am serving as a scribe for Dr. [name]  Chief Complaint: neck and back pain   HPI:   04/15/21 Patient is a 60 year old female presenting with back pain. Patient was last seen by Dr. [name] on 04/01/21 and had OMT. Today patient states that her neck is causing her some tension and pain. Patient is experiencing right sided thoracic pain that radiates to the front of the chest. Patient thinks her rib slipped out again.    04/23/21 Patient states that she is having some pain in her left foot with in the last week. Patient locates pain to the top of left foot/ankle. Worse  when walking or standing. Patient states that her back/neck have been doing a lot better, just last night the right shoulder blade area started to flare up again  04/29/21 Patient states that the manipulations are helping a lot. States will get a really good day after her OMT but her muscles will start to get sore again.   Relevant Historical Information: history of C-spine fusion  Additional pertinent review of systems negative.  Current Outpatient Medications  Medication Sig Dispense Refill   acetaminophen-codeine (TYLENOL #3) 300-30 MG tablet Take 1 tablet by mouth every 6 (six) hours as needed. 20 tablet 0   AIMOVIG 70 MG/ML SOAJ INJECT 70 MG INTO THE SKIN EVERY 30 (THIRTY) DAYS. 3 mL 1   amoxicillin-clavulanate (AUGMENTIN) 875-125 MG tablet Take 1 tablet by mouth 2 (two) times daily. 20 tablet 0   Cholecalciferol (VITAMIN D3) 1.25 MG (50000 UT) CAPS TAKE 1 CAPSULE BY MOUTH ONE TIME PER WEEK (Patient taking differently: every 14 (fourteen) days.) 12 capsule 3   clonazePAM (KLONOPIN) 1 MG tablet Take 1 tablet (1 mg total) by mouth 2 (two) times daily. 60 tablet 3   clotrimazole-betamethasone (LOTRISONE) cream APPLY TO AFFECTED AREA TWICE A DAY (Patient taking differently: 2 (two) times daily as needed.) 30 g 2   cyclobenzaprine (FLEXERIL) 10 MG tablet Take 1 tablet by mouth every 8 (eight) hours as needed.     escitalopram (LEXAPRO) 10 MG tablet TAKE 1 TABLET BY MOUTH EVERY DAY 90 tablet 2   esomeprazole (NEXIUM) 40 MG capsule TAKE 1 CAPSULE BY MOUTH EVERY DAY (Patient taking  differently: daily as needed.) 90 capsule 3   Estradiol 10 MCG TABS vaginal tablet Place 1 tablet (10 mcg total) vaginally 2 (two) times a week. 8 tablet 10   fluticasone (FLONASE) 50 MCG/ACT nasal spray Place 2 sprays into both nostrils daily. 16 g 11   hydrocortisone (ANUSOL-HC) 2.5 % rectal cream Place rectally 2 (two) times daily. Use as needed for hemorrhoids. 30 g 1   hydrOXYzine (ATARAX/VISTARIL) 25 MG tablet  Take 1 tablet (25 mg total) by mouth at bedtime. (Patient taking differently: Take 25 mg by mouth at bedtime as needed.) 30 tablet 5   hyoscyamine (LEVBID) 0.375 MG 12 hr tablet Take 1 tablet (0.375 mg total) by mouth every 12 (twelve) hours as needed. 30 tablet 0   meloxicam (MOBIC) 7.5 MG tablet TAKE 1 TABLET BY MOUTH EVERY DAY 30 tablet 0   Multiple Vitamin (MULTIVITAMIN WITH MINERALS) TABS tablet Take 1 tablet by mouth daily.     mupirocin ointment (BACTROBAN) 2 % Apply 1 application topically 2 (two) times daily. 22 g 0   ondansetron (ZOFRAN ODT) 4 MG disintegrating tablet Take 1 tablet (4 mg total) by mouth every 8 (eight) hours as needed for nausea or vomiting. 20 tablet 0   phenazopyridine (PYRIDIUM) 200 MG tablet Take 1 tablet (200 mg total) by mouth 3 (three) times daily as needed. 6 tablet 0   predniSONE (DELTASONE) 20 MG tablet Take 1 tablet (20 mg total) by mouth daily with breakfast. 7 tablet 0   SUMAtriptan (IMITREX) 100 MG tablet TAKE 1 TABLET BY MOUTH AS NEEDED FOR MIGRAINE HEADACHE, MAY REPEAT ONCE IN TWO HOURS AS NEEDED BUT NO MORE THAN 2 IN 24 HOURS. 9 tablet 4   tiZANidine (ZANAFLEX) 2 MG tablet Take 1 tablet (2 mg total) by mouth at bedtime. 10 tablet 0   traZODone (DESYREL) 50 MG tablet Take 0.5-1 tablets (25-50 mg total) by mouth at bedtime as needed for sleep. 30 tablet 3   valACYclovir (VALTREX) 500 MG tablet Take one tablet po bid x 3 days as needed. 30 tablet 3   No current facility-administered medications for this visit.      Objective:     Vitals:   04/29/21 1459  BP: 120/80  Pulse: 80  SpO2: 98%  Weight: 146 lb (66.2 kg)  Height: 5\' 4"  (1.626 m)      Body mass index is 25.06 kg/m.    Physical Exam:     General: Well-appearing, cooperative, sitting comfortably in no acute distress.   OMT Physical Exam:  ASIS Compression Test: Positive Right Cervical: TTP paraspinal, C3-5 RRSR Rib: Bilateral elevated first rib with TTP Thoracic: TTP paraspinal, T6  RLSR, T8 8-10 RRSL Lumbar: TTP paraspinal, L5 rotated right Pelvis: Right anterior innominate  Electronically signed by:  Gabriela Shepard D.[facility] 3:20 PM 04/29/21

## 2021-04-29 ENCOUNTER — Other Ambulatory Visit: Payer: Self-pay

## 2021-04-29 ENCOUNTER — Ambulatory Visit (INDEPENDENT_AMBULATORY_CARE_PROVIDER_SITE_OTHER): Payer: BC Managed Care – PPO | Admitting: Sports Medicine

## 2021-04-29 VITALS — BP 120/80 | HR 80 | Ht 64.0 in | Wt 146.0 lb

## 2021-04-29 DIAGNOSIS — G8929 Other chronic pain: Secondary | ICD-10-CM | POA: Diagnosis not present

## 2021-04-29 DIAGNOSIS — M9901 Segmental and somatic dysfunction of cervical region: Secondary | ICD-10-CM

## 2021-04-29 DIAGNOSIS — M546 Pain in thoracic spine: Secondary | ICD-10-CM | POA: Diagnosis not present

## 2021-04-29 DIAGNOSIS — F4312 Post-traumatic stress disorder, chronic: Secondary | ICD-10-CM | POA: Diagnosis not present

## 2021-04-29 DIAGNOSIS — M9903 Segmental and somatic dysfunction of lumbar region: Secondary | ICD-10-CM

## 2021-04-29 DIAGNOSIS — M9908 Segmental and somatic dysfunction of rib cage: Secondary | ICD-10-CM

## 2021-04-29 DIAGNOSIS — M9902 Segmental and somatic dysfunction of thoracic region: Secondary | ICD-10-CM | POA: Diagnosis not present

## 2021-04-29 DIAGNOSIS — M9905 Segmental and somatic dysfunction of pelvic region: Secondary | ICD-10-CM

## 2021-04-29 NOTE — Patient Instructions (Signed)
Good to see you  Follow up in one week

## 2021-05-04 ENCOUNTER — Encounter: Payer: BC Managed Care – PPO | Admitting: Physical Therapy

## 2021-05-04 ENCOUNTER — Other Ambulatory Visit: Payer: Self-pay | Admitting: Family Medicine

## 2021-05-05 ENCOUNTER — Other Ambulatory Visit: Payer: Self-pay | Admitting: Family Medicine

## 2021-05-05 DIAGNOSIS — H2513 Age-related nuclear cataract, bilateral: Secondary | ICD-10-CM | POA: Diagnosis not present

## 2021-05-05 DIAGNOSIS — F4312 Post-traumatic stress disorder, chronic: Secondary | ICD-10-CM | POA: Diagnosis not present

## 2021-05-05 DIAGNOSIS — H35372 Puckering of macula, left eye: Secondary | ICD-10-CM | POA: Diagnosis not present

## 2021-05-05 DIAGNOSIS — H10502 Unspecified blepharoconjunctivitis, left eye: Secondary | ICD-10-CM | POA: Diagnosis not present

## 2021-05-05 DIAGNOSIS — H00024 Hordeolum internum left upper eyelid: Secondary | ICD-10-CM | POA: Diagnosis not present

## 2021-05-05 NOTE — Progress Notes (Deleted)
Gabriela Shepard D.[place] Phone: [phone]   Assessment and Plan:     There are no diagnoses linked to this encounter.  *** - Patient has received significant relief with OMT in the past.  Elects for repeat OMT today.  Tolerated well per note below. - Decision today to treat with OMT was based on Physical Exam   After verbal consent patient was treated with HVLA (high velocity low amplitude), ME (muscle energy), FPR (flex positional release), ST (soft tissue), PC/PD (Pelvic Compression/ Pelvic Decompression) techniques in cervical, rib, thoracic, lumbar, and pelvic areas. Patient tolerated the procedure well with improvement in symptoms.  Patient educated on potential side effects of soreness and recommended to rest, hydrate, and use Tylenol as needed for pain control.   Pertinent previous records reviewed include ***   Follow Up: ***     Subjective:   I, Gabriela Shepard, am serving as a scribe for Dr. Glennon Shepard  Chief Complaint: Neck and back pain   HPI:   04/15/21 Patient is a 60 year old female presenting with back pain. Patient was last seen by Dr. Tamala Shepard on 04/01/21 and had OMT. Today patient states that her neck is causing her some tension and pain. Patient is experiencing right sided thoracic pain that radiates to the front of the chest. Patient thinks her rib slipped out again.    04/23/21 Patient states that she is having some pain in her left foot with in the last week. Patient locates pain to the top of left foot/ankle. Worse when walking or standing. Patient states that her back/neck have been doing a lot better, just last night the right shoulder blade area started to flare up again   04/29/21 Patient states that the manipulations are helping a lot. States will get a really good day after her OMT but her muscles will start to get sore again.   05/06/21 Patent states   Relevant Historical  Information:  history of C-spine fusion  Additional pertinent review of systems negative.  Current Outpatient Medications  Medication Sig Dispense Refill   acetaminophen-codeine (TYLENOL #3) 300-30 MG tablet Take 1 tablet by mouth every 6 (six) hours as needed. 20 tablet 0   AIMOVIG 70 MG/ML SOAJ INJECT 70 MG INTO THE SKIN EVERY 30 (THIRTY) DAYS. 3 mL 1   amoxicillin-clavulanate (AUGMENTIN) 875-125 MG tablet Take 1 tablet by mouth 2 (two) times daily. 20 tablet 0   Cholecalciferol (VITAMIN D3) 1.25 MG (50000 UT) CAPS TAKE 1 CAPSULE BY MOUTH ONE TIME PER WEEK (Patient taking differently: every 14 (fourteen) days.) 12 capsule 3   clonazePAM (KLONOPIN) 1 MG tablet Take 1 tablet (1 mg total) by mouth 2 (two) times daily. 60 tablet 3   clotrimazole-betamethasone (LOTRISONE) cream APPLY TO AFFECTED AREA TWICE A DAY (Patient taking differently: 2 (two) times daily as needed.) 30 g 2   cyclobenzaprine (FLEXERIL) 10 MG tablet Take 1 tablet by mouth every 8 (eight) hours as needed.     escitalopram (LEXAPRO) 10 MG tablet TAKE 1 TABLET BY MOUTH EVERY DAY 90 tablet 2   esomeprazole (NEXIUM) 40 MG capsule TAKE 1 CAPSULE BY MOUTH EVERY DAY (Patient taking differently: daily as needed.) 90 capsule 3   Estradiol 10 MCG TABS vaginal tablet Place 1 tablet (10 mcg total) vaginally 2 (two) times a week. 8 tablet 10   fluticasone (FLONASE) 50 MCG/ACT nasal spray Place 2 sprays into both nostrils daily. 16 g  11   hydrocortisone (ANUSOL-HC) 2.5 % rectal cream Place rectally 2 (two) times daily. Use as needed for hemorrhoids. 30 g 1   hydrOXYzine (ATARAX/VISTARIL) 25 MG tablet Take 1 tablet (25 mg total) by mouth at bedtime. (Patient taking differently: Take 25 mg by mouth at bedtime as needed.) 30 tablet 5   hyoscyamine (LEVBID) 0.375 MG 12 hr tablet TAKE 1 TABLET BY MOUTH EVERY 12 HOURS AS NEEDED. 30 tablet 0   meloxicam (MOBIC) 7.5 MG tablet TAKE 1 TABLET BY MOUTH EVERY DAY 30 tablet 0   Multiple Vitamin  (MULTIVITAMIN WITH MINERALS) TABS tablet Take 1 tablet by mouth daily.     mupirocin ointment (BACTROBAN) 2 % Apply 1 application topically 2 (two) times daily. 22 g 0   ondansetron (ZOFRAN ODT) 4 MG disintegrating tablet Take 1 tablet (4 mg total) by mouth every 8 (eight) hours as needed for nausea or vomiting. 20 tablet 0   phenazopyridine (PYRIDIUM) 200 MG tablet Take 1 tablet (200 mg total) by mouth 3 (three) times daily as needed. 6 tablet 0   predniSONE (DELTASONE) 20 MG tablet Take 1 tablet (20 mg total) by mouth daily with breakfast. 7 tablet 0   SUMAtriptan (IMITREX) 100 MG tablet TAKE 1 TABLET BY MOUTH AS NEEDED FOR MIGRAINE HEADACHE, MAY REPEAT ONCE IN TWO HOURS AS NEEDED BUT NO MORE THAN 2 IN 24 HOURS. 9 tablet 4   tiZANidine (ZANAFLEX) 2 MG tablet Take 1 tablet (2 mg total) by mouth at bedtime. 10 tablet 0   traZODone (DESYREL) 50 MG tablet Take 0.5-1 tablets (25-50 mg total) by mouth at bedtime as needed for sleep. 30 tablet 3   valACYclovir (VALTREX) 500 MG tablet Take one tablet po bid x 3 days as needed. 30 tablet 3   No current facility-administered medications for this visit.      Objective:     There were no vitals filed for this visit.    There is no height or weight on file to calculate BMI.    Physical Exam:     General: Well-appearing, cooperative, sitting comfortably in no acute distress.   OMT Physical Exam:  ASIS Compression Test: Positive Right Cervical: TTP paraspinal, *** Rib: Bilateral elevated first rib with TTP Thoracic: TTP paraspinal,*** Lumbar: TTP paraspinal,*** Pelvis: Right anterior innominate  Electronically signed by:  Gabriela Shepard [facility] 2:36 PM 05/05/21

## 2021-05-06 ENCOUNTER — Encounter: Payer: BC Managed Care – PPO | Admitting: Physical Therapy

## 2021-05-06 ENCOUNTER — Ambulatory Visit: Payer: BC Managed Care – PPO | Admitting: Sports Medicine

## 2021-05-07 ENCOUNTER — Other Ambulatory Visit: Payer: Self-pay | Admitting: Family Medicine

## 2021-05-07 DIAGNOSIS — H00024 Hordeolum internum left upper eyelid: Secondary | ICD-10-CM | POA: Diagnosis not present

## 2021-05-07 NOTE — Progress Notes (Signed)
Gabriela Shepard D.[place] Phone: [phone]   Assessment and Plan:     1. Chronic bilateral thoracic back pain 2. Somatic dysfunction of cervical region 3. Somatic dysfunction of thoracic region 4. Somatic dysfunction of lumbar region 5. Somatic dysfunction of pelvic region 6. Somatic dysfunction of rib region -Chronic with exacerbation, subsequent visit - Recurrence of typical musculoskeletal complaints with most prominent being in thoracic spine and neck -No red flag symptoms - No HVLA performed on cervical spine due to history of fusion - Patient has received significant relief with OMT in the past.  Elects for repeat OMT today.  Tolerated well per note below. - Decision today to treat with OMT was based on Physical Exam  After verbal consent patient was treated with HVLA (high velocity low amplitude), ME (muscle energy), FPR (flex positional release), ST (soft tissue), PC/PD (Pelvic Compression/ Pelvic Decompression) techniques in cervical, rib, thoracic, lumbar, and pelvic areas. Patient tolerated the procedure well with improvement in symptoms.  Patient educated on potential side effects of soreness and recommended to rest, hydrate, and use Tylenol as needed for pain control.     Pertinent previous records reviewed include none   Follow Up: 2 to 4 weeks for repeat OMT   Subjective:   I, Gabriela Shepard, am serving as a scribe for Dr. [name]  Chief Complaint: Neck and back pain   HPI:   04/15/21 Patient is a 60 year old female presenting with back pain. Patient was last seen by Dr. [name] on 04/01/21 and had OMT. Today patient states that her neck is causing her some tension and pain. Patient is experiencing right sided thoracic pain that radiates to the front of the chest. Patient thinks her rib slipped out again.    04/23/21 Patient states that she is having some pain in her left foot  with in the last week. Patient locates pain to the top of left foot/ankle. Worse when walking or standing. Patient states that her back/neck have been doing a lot better, just last night the right shoulder blade area started to flare up again   04/29/21 Patient states that the manipulations are helping a lot. States will get a really good day after her OMT but her muscles will start to get sore again.   05/08/21 Patient states that overall she is doing well today.  She feels the manipulations have not helping.  Recurrence of typical thoracic pain, however no radicular symptoms or new trauma.  Relevant Historical Information: History of C-spine fusion   Additional pertinent review of systems negative.   Current Outpatient Medications:    acetaminophen-codeine (TYLENOL #3) 300-30 MG tablet, Take 1 tablet by mouth every 6 (six) hours as needed., Disp: 20 tablet, Rfl: 0   AIMOVIG 70 MG/ML SOAJ, INJECT 70 MG INTO THE SKIN EVERY 30 (THIRTY) DAYS., Disp: 3 mL, Rfl: 1   amoxicillin-clavulanate (AUGMENTIN) 875-125 MG tablet, Take 1 tablet by mouth 2 (two) times daily., Disp: 20 tablet, Rfl: 0   Cholecalciferol (VITAMIN D3) 1.25 MG (50000 UT) CAPS, TAKE 1 CAPSULE BY MOUTH ONE TIME PER WEEK (Patient taking differently: every 14 (fourteen) days.), Disp: 12 capsule, Rfl: 3   clonazePAM (KLONOPIN) 1 MG tablet, Take 1 tablet (1 mg total) by mouth 2 (two) times daily., Disp: 60 tablet, Rfl: 3   clotrimazole-betamethasone (LOTRISONE) cream, APPLY TO AFFECTED AREA TWICE A DAY (Patient taking differently: 2 (two) times daily as needed.), Disp:  30 g, Rfl: 2   cyclobenzaprine (FLEXERIL) 10 MG tablet, Take 1 tablet by mouth every 8 (eight) hours as needed., Disp: , Rfl:    escitalopram (LEXAPRO) 10 MG tablet, TAKE 1 TABLET BY MOUTH EVERY DAY, Disp: 90 tablet, Rfl: 2   esomeprazole (NEXIUM) 40 MG capsule, TAKE 1 CAPSULE BY MOUTH EVERY DAY (Patient taking differently: daily as needed.), Disp: 90 capsule, Rfl: 3    Estradiol 10 MCG TABS vaginal tablet, Place 1 tablet (10 mcg total) vaginally 2 (two) times a week., Disp: 8 tablet, Rfl: 10   fluticasone (FLONASE) 50 MCG/ACT nasal spray, Place 2 sprays into both nostrils daily., Disp: 16 g, Rfl: 11   hydrocortisone (ANUSOL-HC) 2.5 % rectal cream, Place rectally 2 (two) times daily. Use as needed for hemorrhoids., Disp: 30 g, Rfl: 1   hydrOXYzine (ATARAX/VISTARIL) 25 MG tablet, Take 1 tablet (25 mg total) by mouth at bedtime. (Patient taking differently: Take 25 mg by mouth at bedtime as needed.), Disp: 30 tablet, Rfl: 5   hyoscyamine (LEVBID) 0.375 MG 12 hr tablet, TAKE 1 TABLET BY MOUTH EVERY 12 HOURS AS NEEDED., Disp: 30 tablet, Rfl: 0   meloxicam (MOBIC) 7.5 MG tablet, TAKE 1 TABLET BY MOUTH EVERY DAY, Disp: 30 tablet, Rfl: 0   Multiple Vitamin (MULTIVITAMIN WITH MINERALS) TABS tablet, Take 1 tablet by mouth daily., Disp: , Rfl:    mupirocin ointment (BACTROBAN) 2 %, Apply 1 application topically 2 (two) times daily., Disp: 22 g, Rfl: 0   ondansetron (ZOFRAN ODT) 4 MG disintegrating tablet, Take 1 tablet (4 mg total) by mouth every 8 (eight) hours as needed for nausea or vomiting., Disp: 20 tablet, Rfl: 0   phenazopyridine (PYRIDIUM) 200 MG tablet, Take 1 tablet (200 mg total) by mouth 3 (three) times daily as needed., Disp: 6 tablet, Rfl: 0   predniSONE (DELTASONE) 20 MG tablet, Take 1 tablet (20 mg total) by mouth daily with breakfast., Disp: 7 tablet, Rfl: 0   SUMAtriptan (IMITREX) 100 MG tablet, TAKE 1 TABLET BY MOUTH AS NEEDED FOR MIGRAINE HEADACHE, MAY REPEAT ONCE IN TWO HOURS AS NEEDED BUT NO MORE THAN 2 IN 24 HOURS., Disp: 9 tablet, Rfl: 4   tiZANidine (ZANAFLEX) 2 MG tablet, Take 1 tablet (2 mg total) by mouth at bedtime., Disp: 10 tablet, Rfl: 0   traZODone (DESYREL) 50 MG tablet, Take 0.5-1 tablets (25-50 mg total) by mouth at bedtime as needed for sleep., Disp: 30 tablet, Rfl: 3   valACYclovir (VALTREX) 500 MG tablet, Take one tablet po bid x 3 days  as needed., Disp: 30 tablet, Rfl: 3   Objective:     Vitals:   05/08/21 1043  BP: 110/70  Pulse: 76  SpO2: 97%      There is no height or weight on file to calculate BMI.    Physical Exam:    General: Well-appearing, cooperative, sitting comfortably in no acute distress.   OMT Physical Exam:  ASIS Compression Test: Positive Right Cervical: TTP paraspinal, C5-7 decreased motion with history of fusion Rib: Bilateral elevated first rib with TTP Thoracic: TTP paraspinal, T6 RLSR, T8 8-10 RRSL Lumbar: TTP paraspinal, L5 rotated right Pelvis: Right anterior innominate   Electronically signed by:  Gabriela Shepard D.[facility] 10:58 AM 05/08/21

## 2021-05-08 ENCOUNTER — Other Ambulatory Visit: Payer: Self-pay

## 2021-05-08 ENCOUNTER — Other Ambulatory Visit: Payer: Self-pay | Admitting: Family Medicine

## 2021-05-08 ENCOUNTER — Ambulatory Visit (INDEPENDENT_AMBULATORY_CARE_PROVIDER_SITE_OTHER): Payer: BC Managed Care – PPO | Admitting: Sports Medicine

## 2021-05-08 VITALS — BP 110/70 | HR 76

## 2021-05-08 DIAGNOSIS — M9905 Segmental and somatic dysfunction of pelvic region: Secondary | ICD-10-CM

## 2021-05-08 DIAGNOSIS — M9903 Segmental and somatic dysfunction of lumbar region: Secondary | ICD-10-CM

## 2021-05-08 DIAGNOSIS — M9901 Segmental and somatic dysfunction of cervical region: Secondary | ICD-10-CM

## 2021-05-08 DIAGNOSIS — M9902 Segmental and somatic dysfunction of thoracic region: Secondary | ICD-10-CM | POA: Diagnosis not present

## 2021-05-08 DIAGNOSIS — M546 Pain in thoracic spine: Secondary | ICD-10-CM

## 2021-05-08 DIAGNOSIS — G8929 Other chronic pain: Secondary | ICD-10-CM | POA: Diagnosis not present

## 2021-05-08 DIAGNOSIS — M9908 Segmental and somatic dysfunction of rib cage: Secondary | ICD-10-CM

## 2021-05-11 ENCOUNTER — Encounter: Payer: Self-pay | Admitting: Family Medicine

## 2021-05-11 DIAGNOSIS — M47816 Spondylosis without myelopathy or radiculopathy, lumbar region: Secondary | ICD-10-CM | POA: Diagnosis not present

## 2021-05-12 NOTE — Telephone Encounter (Signed)
Left a message for the pt to return my call.  

## 2021-05-12 NOTE — Telephone Encounter (Signed)
Patient called following up voicemail from [facility]. I let patient know that there was a message in mychart that was sent to her stating she could come in for her prolia injection. Patient states that she is unsure if she even wants to get it and scheduled an appointment to discuss it with Dr.Burchette

## 2021-05-13 DIAGNOSIS — H04123 Dry eye syndrome of bilateral lacrimal glands: Secondary | ICD-10-CM | POA: Diagnosis not present

## 2021-05-13 DIAGNOSIS — D492 Neoplasm of unspecified behavior of bone, soft tissue, and skin: Secondary | ICD-10-CM | POA: Diagnosis not present

## 2021-05-13 DIAGNOSIS — H10502 Unspecified blepharoconjunctivitis, left eye: Secondary | ICD-10-CM | POA: Diagnosis not present

## 2021-05-13 NOTE — Telephone Encounter (Signed)
Noted  

## 2021-05-14 NOTE — Progress Notes (Signed)
Gabriela Shepard D.[place] Phone: [phone]   Assessment and Plan:     1. Chronic bilateral thoracic back pain 2. Somatic dysfunction of cervical region 3. Somatic dysfunction of thoracic region 4. Somatic dysfunction of lumbar region 5. Somatic dysfunction of pelvic region 6. Somatic dysfunction of rib region -Chronic with exacerbation, subsequent visit - Recurrence of typical musculoskeletal complaints with most prominent being in thoracic spine and neck -No red flag symptoms - No HVLA performed on cervical spine due to history of fusion  - Patient has received significant relief with OMT in the past.  Elects for repeat OMT today.  Tolerated well per note below. - Decision today to treat with OMT was based on Physical Exam   After verbal consent patient was treated with HVLA (high velocity low amplitude), ME (muscle energy), FPR (flex positional release), ST (soft tissue), PC/PD (Pelvic Compression/ Pelvic Decompression) techniques in cervical, rib, thoracic, lumbar, and pelvic areas. Patient tolerated the procedure well with improvement in symptoms.  Patient educated on potential side effects of soreness and recommended to rest, hydrate, and use Tylenol as needed for pain control.   Pertinent previous records reviewed include none   Follow Up: 1 week for repeat OMT     Subjective:    I, Gabriela Shepard, am serving as a Education administrator for Doctor [name]  Chief Complaint: neck and back pain   HPI:   04/15/21 Patient is a 60 year old female presenting with back pain. Patient was last seen by Dr. [name] on 04/01/21 and had OMT. Today patient states that her neck is causing her some tension and pain. Patient is experiencing right sided thoracic pain that radiates to the front of the chest. Patient thinks her rib slipped out again.    04/23/21 Patient states that she is having some pain in her left foot with in the  last week. Patient locates pain to the top of left foot/ankle. Worse when walking or standing. Patient states that her back/neck have been doing a lot better, just last night the right shoulder blade area started to flare up again   04/29/21 Patient states that the manipulations are helping a lot. States will get a really good day after her OMT but her muscles will start to get sore again.    05/08/21 Patient states that overall she is doing well today.  She feels the manipulations have not helping.  Recurrence of typical thoracic pain, however no radicular symptoms or new trauma.   05/15/2021 Patient states   Relevant Historical Information: History of C-spine fusion    Additional pertinent review of systems negative.  Current Outpatient Medications  Medication Sig Dispense Refill   acetaminophen-codeine (TYLENOL #3) 300-30 MG tablet Take 1 tablet by mouth every 6 (six) hours as needed. 20 tablet 0   AIMOVIG 70 MG/ML SOAJ INJECT 70 MG INTO THE SKIN EVERY 30 (THIRTY) DAYS. 3 mL 1   amoxicillin-clavulanate (AUGMENTIN) 875-125 MG tablet Take 1 tablet by mouth 2 (two) times daily. 20 tablet 0   Cholecalciferol (VITAMIN D3) 1.25 MG (50000 UT) CAPS TAKE 1 CAPSULE BY MOUTH ONE TIME PER WEEK (Patient taking differently: every 14 (fourteen) days.) 12 capsule 3   clonazePAM (KLONOPIN) 1 MG tablet Take 1 tablet (1 mg total) by mouth 2 (two) times daily. 60 tablet 3   clotrimazole-betamethasone (LOTRISONE) cream APPLY TO AFFECTED AREA TWICE A DAY (Patient taking differently: 2 (two) times daily as needed.)  30 g 2   cyclobenzaprine (FLEXERIL) 10 MG tablet Take 1 tablet by mouth every 8 (eight) hours as needed.     escitalopram (LEXAPRO) 10 MG tablet TAKE 1 TABLET BY MOUTH EVERY DAY 90 tablet 2   esomeprazole (NEXIUM) 40 MG capsule TAKE 1 CAPSULE BY MOUTH EVERY DAY (Patient taking differently: daily as needed.) 90 capsule 3   Estradiol 10 MCG TABS vaginal tablet Place 1 tablet (10 mcg total) vaginally 2  (two) times a week. 8 tablet 10   fluticasone (FLONASE) 50 MCG/ACT nasal spray Place 2 sprays into both nostrils daily. 16 g 11   hydrocortisone (ANUSOL-HC) 2.5 % rectal cream Place rectally 2 (two) times daily. Use as needed for hemorrhoids. 30 g 1   hydrOXYzine (ATARAX/VISTARIL) 25 MG tablet Take 1 tablet (25 mg total) by mouth at bedtime. (Patient taking differently: Take 25 mg by mouth at bedtime as needed.) 30 tablet 5   hyoscyamine (LEVBID) 0.375 MG 12 hr tablet TAKE 1 TABLET BY MOUTH EVERY 12 HOURS AS NEEDED 180 tablet 0   meloxicam (MOBIC) 7.5 MG tablet TAKE 1 TABLET BY MOUTH EVERY DAY 30 tablet 0   Multiple Vitamin (MULTIVITAMIN WITH MINERALS) TABS tablet Take 1 tablet by mouth daily.     mupirocin ointment (BACTROBAN) 2 % Apply 1 application topically 2 (two) times daily. 22 g 0   ondansetron (ZOFRAN ODT) 4 MG disintegrating tablet Take 1 tablet (4 mg total) by mouth every 8 (eight) hours as needed for nausea or vomiting. 20 tablet 0   phenazopyridine (PYRIDIUM) 200 MG tablet Take 1 tablet (200 mg total) by mouth 3 (three) times daily as needed. 6 tablet 0   predniSONE (DELTASONE) 20 MG tablet Take 1 tablet (20 mg total) by mouth daily with breakfast. 7 tablet 0   SUMAtriptan (IMITREX) 100 MG tablet TAKE 1 TABLET BY MOUTH AS NEEDED FOR MIGRAINE HEADACHE, MAY REPEAT ONCE IN TWO HOURS AS NEEDED BUT NO MORE THAN 2 IN 24 HOURS. 9 tablet 4   tiZANidine (ZANAFLEX) 2 MG tablet Take 1 tablet (2 mg total) by mouth at bedtime. 10 tablet 0   traZODone (DESYREL) 50 MG tablet Take 0.5-1 tablets (25-50 mg total) by mouth at bedtime as needed for sleep. 30 tablet 3   valACYclovir (VALTREX) 500 MG tablet Take one tablet po bid x 3 days as needed. 30 tablet 3   No current facility-administered medications for this visit.      Objective:     Vitals:   05/15/21 1131  BP: 122/78  Pulse: 88  SpO2: 98%  Weight: 145 lb (65.8 kg)  Height: 5\' 4"  (1.626 m)      Body mass index is 24.89 kg/m.     Physical Exam:     General: Well-appearing, cooperative, sitting comfortably in no acute distress.   OMT Physical Exam:  ASIS Compression Test: Positive Right Cervical: TTP paraspinal, C5-7 decreased motion with history of fusion Rib: Bilateral elevated first rib with TTP Thoracic: TTP paraspinal, T6 RLSR, T8 8-10 RRSL Lumbar: TTP paraspinal, L5 rotated right Pelvis: Right anterior innominate  Electronically signed by:  Gabriela Shepard D.[facility] 11:53 AM 05/15/21

## 2021-05-15 ENCOUNTER — Ambulatory Visit (INDEPENDENT_AMBULATORY_CARE_PROVIDER_SITE_OTHER): Payer: BC Managed Care – PPO | Admitting: Sports Medicine

## 2021-05-15 ENCOUNTER — Other Ambulatory Visit: Payer: Self-pay

## 2021-05-15 VITALS — BP 122/78 | HR 88 | Ht 64.0 in | Wt 145.0 lb

## 2021-05-15 DIAGNOSIS — M9901 Segmental and somatic dysfunction of cervical region: Secondary | ICD-10-CM | POA: Diagnosis not present

## 2021-05-15 DIAGNOSIS — F4312 Post-traumatic stress disorder, chronic: Secondary | ICD-10-CM | POA: Diagnosis not present

## 2021-05-15 DIAGNOSIS — M9903 Segmental and somatic dysfunction of lumbar region: Secondary | ICD-10-CM | POA: Diagnosis not present

## 2021-05-15 DIAGNOSIS — G8929 Other chronic pain: Secondary | ICD-10-CM | POA: Diagnosis not present

## 2021-05-15 DIAGNOSIS — M9908 Segmental and somatic dysfunction of rib cage: Secondary | ICD-10-CM

## 2021-05-15 DIAGNOSIS — M9905 Segmental and somatic dysfunction of pelvic region: Secondary | ICD-10-CM

## 2021-05-15 DIAGNOSIS — M546 Pain in thoracic spine: Secondary | ICD-10-CM

## 2021-05-15 DIAGNOSIS — M9902 Segmental and somatic dysfunction of thoracic region: Secondary | ICD-10-CM

## 2021-05-15 NOTE — Patient Instructions (Signed)
Good to see you   

## 2021-05-18 ENCOUNTER — Encounter: Payer: Self-pay | Admitting: Family Medicine

## 2021-05-18 NOTE — Telephone Encounter (Signed)
Left a message for the pt to return my call.  

## 2021-05-19 ENCOUNTER — Other Ambulatory Visit: Payer: Self-pay

## 2021-05-19 MED ORDER — PROLIA 60 MG/ML ~~LOC~~ SOSY: 60.0000 mg | PREFILLED_SYRINGE | SUBCUTANEOUS | 1 refills | Status: DC

## 2021-05-19 MED ORDER — TRAZODONE HCL 50 MG PO TABS: ORAL_TABLET | ORAL | 1 refills | Status: DC

## 2021-05-19 NOTE — Addendum Note (Signed)
Addended by: Nathanial Millman E on: 05/19/2021 12:20 PM   Modules accepted: Orders

## 2021-05-20 DIAGNOSIS — F4312 Post-traumatic stress disorder, chronic: Secondary | ICD-10-CM | POA: Diagnosis not present

## 2021-05-20 DIAGNOSIS — E041 Nontoxic single thyroid nodule: Secondary | ICD-10-CM | POA: Diagnosis not present

## 2021-05-20 DIAGNOSIS — M357 Hypermobility syndrome: Secondary | ICD-10-CM | POA: Diagnosis not present

## 2021-05-20 DIAGNOSIS — E871 Hypo-osmolality and hyponatremia: Secondary | ICD-10-CM | POA: Diagnosis not present

## 2021-05-20 DIAGNOSIS — M533 Sacrococcygeal disorders, not elsewhere classified: Secondary | ICD-10-CM | POA: Diagnosis not present

## 2021-05-21 NOTE — Progress Notes (Signed)
[place] Phone: [phone] Subjective:   [name], am serving as a scribe for Dr. Hulan Saas. This visit occurred during the SARS-CoV-2 public health emergency.  Safety protocols were in place, including screening questions prior to the visit, additional usage of staff PPE, and extensive cleaning of exam room while observing appropriate contact time as indicated for disinfecting solutions.  I'm seeing this patient by the request  of:  Eulas Post, MD  CC: Back and neck pain  [id number]  Gabriela Shepard is a 60 y.o. female coming in with complaint of back and neck pain. OMT on 05/15/2021 with Dr. [name]. Patient states that her back is improving.  States that it is very slow overall.  Does feel that manipulation has been helpful.  Patient has seen another doctor recently and has been put on different estrogen, progesterone and testosterone recently.  Medications patient has been prescribed: trazodone  Taking:         Reviewed prior external information including notes and imaging from previsou exam, outside providers and external EMR if available.   As well as notes that were available from care everywhere and other healthcare systems.  Past medical history, social, surgical and family history all reviewed in electronic medical record.  No pertanent information unless stated regarding to the chief complaint.   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   History of UTI    Interstitial cystitis    Low sodium levels    Migraines    Osteoporosis    PONV (postoperative  nausea and vomiting)    PTSD (post-traumatic stress disorder)    on meds   STD (sexually transmitted disease)    Substance abuse (HCC)    hx of alcohol   Urine incontinence     Allergies  Allergen Reactions   Sulfa Antibiotics Hives     Review of Systems:  No headache, visual changes, nausea, vomiting, diarrhea, constipation, dizziness, abdominal pain, skin rash, fevers, chills, night sweats, weight loss, swollen lymph nodes,  joint swelling, chest pain, shortness of breath, mood changes. POSITIVE muscle aches, body aches  Objective  Blood pressure 118/84, pulse 71, height 5\' 4"  (1.626 m), weight 148 lb (67.1 kg), last menstrual period 06/07/2010, SpO2 98 %.   General: No apparent distress alert and oriented x3 mood and affect normal, dressed appropriately.  HEENT: Pupils equal, extraocular movements intact  Respiratory: Patient's speak in full sentences and does not appear short of breath  Cardiovascular: No lower extremity edema, non tender, no erythema  Continue tightness noted in the parascapular region) left.  Multiple trigger points noted in the rhomboid, trapezius and latissimus dorsi.  Osteopathic findings  C2 flexed rotated and side bent right C6 flexed rotated and side bent left T3 extended rotated and side bent right inhaled rib T9 extended rotated and side bent left L2 flexed rotated and side bent right Sacrum right on right   After verbal consent patient was prepped with alcohol swabs and with a 25-gauge  1 inch needle patient was injected in 3 distinct trigger points with a total of 2 cc of 0.5% Marcaine and 1 cc of Kenalog 40 mg/mL.  These were in the rhomboid, trapezius and latissimus dorsi.  No blood loss.  Band-Aid placed.  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  Discussed with patient at great length.  I attempted osteopathic manipulation with some very mild resolution  for this chronic problem with exacerbation.  Also given some trigger point injections and hoping that it will be beneficial as well.  Discussed icing regimen and home exercises.  Increase activity slowly.  Follow-up again in 6 to 8 weeks.  Trigger point of right shoulder region Repeat injection given today and tolerated the procedure well, discussed which activities to do and which ones to avoid.  Increase activity slowly.  Follow-up again in 6 to 8 weeks   Nonallopathic problems  Decision today to treat with OMT was based on Physical Exam  After verbal consent patient was treated with HVLA, ME, FPR techniques in cervical, rib, thoracic, lumbar, and sacral  areas  Patient tolerated the procedure well with improvement in symptoms  Patient given exercises, stretches and lifestyle modifications  See medications in patient instructions if given  Patient will follow up in 4-8 weeks     The above documentation has been reviewed and is accurate and complete Lyndal Pulley, DO        Note: This dictation was prepared with Dragon dictation along with smaller phrase technology. Any transcriptional errors that result from this process are unintentional.

## 2021-05-22 ENCOUNTER — Encounter: Payer: Self-pay | Admitting: Family Medicine

## 2021-05-22 ENCOUNTER — Ambulatory Visit (INDEPENDENT_AMBULATORY_CARE_PROVIDER_SITE_OTHER): Payer: BC Managed Care – PPO | Admitting: Family Medicine

## 2021-05-22 ENCOUNTER — Other Ambulatory Visit: Payer: Self-pay

## 2021-05-22 VITALS — BP 140/80 | HR 61 | Temp 97.6°F | Wt 142.6 lb

## 2021-05-22 DIAGNOSIS — M8580 Other specified disorders of bone density and structure, unspecified site: Secondary | ICD-10-CM

## 2021-05-22 DIAGNOSIS — M25511 Pain in right shoulder: Secondary | ICD-10-CM | POA: Diagnosis not present

## 2021-05-22 DIAGNOSIS — M940 Chondrocostal junction syndrome [Tietze]: Secondary | ICD-10-CM

## 2021-05-22 NOTE — Assessment & Plan Note (Signed)
Repeat injection given today and tolerated the procedure well, discussed which activities to do and which ones to avoid.  Increase activity slowly.  Follow-up again in 6 to 8 weeks

## 2021-05-22 NOTE — Progress Notes (Signed)
Established Patient Office Visit  Subjective:  Patient ID: Gabriela Shepard, female    DOB: 01/29/1961  Age: 60 y.o. MRN: [id number]  CC:  Chief Complaint  Patient presents with   Follow-up    Discuss prolia    HPI [name] presents for discussion in our opinion regarding whether to start Prolia injections.  She has history of osteopenia with T score -2.4 lumbar spine and -1.7 distal radius by DEXA June 2022.  Apparently endocrinologist recommended Prolia after patient had intolerance with Fosamax.  She states the first 2 times she took Fosamax she tolerated well but the third time she did she had reflux symptoms.  Recent vitamin D level 58.  Recent calcium level normal.  She has some concerns regarding Prolia.  She has read about potential accelerated bone loss if she stopped this down the road and that is her major concern.  She recently went to alternative medicine provider and had multiple hormonal assessments including low plasma ACTH, low testosterone, low DHEA levels.  She apparently was started on some sort of testosterone replacement.  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   History of UTI    Interstitial cystitis    Low sodium levels    Migraines    Osteoporosis    PONV (postoperative nausea and vomiting)    PTSD (post-traumatic stress disorder)    on meds   STD (sexually transmitted disease)    Substance abuse ([place])    hx of alcohol   Urine incontinence     Past Surgical History:  Procedure Laterality Date   BREAST BIOPSY Left    2017 benign x 2   CESAREAN SECTION  1992   COLONOSCOPY  2018   JMP-MAC-suprep(good)-polyps    INCONTINENCE SURGERY     LAPAROSCOPY     under bladder   NECK SURGERY  02/24/2018   ACDF   POLYPECTOMY  2018   polyps removed   TOTAL HIP ARTHROPLASTY Right 09/08/2020   TOTAL HIP ARTHROPLASTY Left 2018   TOTAL KNEE ARTHROPLASTY Left 12/23/2015   Procedure: TOTAL KNEE ARTHROPLASTY;  Surgeon: Melrose Nakayama, MD;  Location: [facility];  Service: Orthopedics;  Laterality: Left;    Family History  Problem Relation Age of Onset   Hypertension Mother    Lung cancer Mother    Kidney cancer Father    Ulcers Father    Heart disease Father    Drug abuse Sister    Alcoholism Brother    Hypertension Maternal Grandmother    Alcohol abuse Other    Arthritis Other    Hypertension Other    Mental illness Other    Healthy Daughter    Healthy Son    Colon cancer Neg Hx    Esophageal cancer Neg Hx    Colon polyps Neg Hx    Rectal cancer Neg Hx    Stomach cancer Neg Hx     Social History   Socioeconomic History   Marital status: Divorced    Spouse name: Not on file   Number of children:  Not on file   Years of education: Not on file   Highest education level: Bachelor's degree (e.g., BA, AB, BS)  Occupational History   Not on file  Tobacco Use   Smoking status: Former    Packs/day: 0.50    Years: 10.00    Pack years: 5.00    Types: Cigarettes    Quit date: 11/13/2010    Years since quitting: 10.5   Smokeless tobacco: Never  Vaping Use   Vaping Use: Never used  Substance and Sexual Activity   Alcohol use: No    Comment: hx of ETOH abuse-sober since 11/2010    Drug use: No   Sexual activity: Not Currently    Birth control/protection: Post-menopausal  Other Topics Concern   Not on file  Social History Narrative   Not on file   Social Determinants of Health   Financial Resource Strain: Low Risk    Difficulty of Paying Living Expenses: Not hard at all  Food Insecurity: No Food Insecurity   Worried About Charity fundraiser in the Last Year: Never true   [place] in the  Last Year: Never true  Transportation Needs: No Transportation Needs   Lack of Transportation (Medical): No   Lack of Transportation (Non-Medical): No  Physical Activity: Insufficiently Active   Days of Exercise per Week: 4 days   Minutes of Exercise per Session: 30 min  Stress: Stress Concern Present   Feeling of Stress : To some extent  Social Connections: Moderately Integrated   Frequency of Communication with Friends and Family: More than three times a week   Frequency of Social Gatherings with Friends and Family: Once a week   Attends Religious Services: 1 to 4 times per year   Active Member of [employer] or Organizations: Yes   Attends Archivist Meetings: 1 to 4 times per year   Marital Status: Divorced  Human resources officer Violence: Not on file    Outpatient Medications Prior to Visit  Medication Sig Dispense Refill   acetaminophen-codeine (TYLENOL #3) 300-30 MG tablet Take 1 tablet by mouth every 6 (six) hours as needed. 20 tablet 0   AIMOVIG 70 MG/ML SOAJ INJECT 70 MG INTO THE SKIN EVERY 30 (THIRTY) DAYS. 3 mL 1   Cholecalciferol (VITAMIN D3) 1.25 MG (50000 UT) CAPS TAKE 1 CAPSULE BY MOUTH ONE TIME PER WEEK (Patient taking differently: every 14 (fourteen) days.) 12 capsule 3   clonazePAM (KLONOPIN) 1 MG tablet Take 1 tablet (1 mg total) by mouth 2 (two) times daily. 60 tablet 3   clotrimazole-betamethasone (LOTRISONE) cream APPLY TO AFFECTED AREA TWICE A DAY (Patient taking differently: 2 (two) times daily as needed.) 30 g 2   cyclobenzaprine (FLEXERIL) 10 MG tablet Take 1 tablet by mouth every 8 (eight) hours as needed.     denosumab (PROLIA) 60 MG/ML SOSY injection Inject 60 mg into the skin every 6 (six) months. 1 mL 1   escitalopram (LEXAPRO) 10 MG tablet TAKE 1 TABLET BY MOUTH EVERY DAY 90 tablet 2   esomeprazole (NEXIUM) 40 MG capsule TAKE 1 CAPSULE BY MOUTH EVERY DAY (Patient taking differently: daily as needed.) 90 capsule 3   Estradiol 10 MCG TABS vaginal tablet  Place 1 tablet (10 mcg total) vaginally 2 (two) times a week. 8 tablet 10   fluticasone (FLONASE) 50 MCG/ACT nasal spray Place 2 sprays into both nostrils daily. 16 g 11   hydrocortisone (ANUSOL-HC) 2.5 % rectal cream Place rectally 2 (two) times  daily. Use as needed for hemorrhoids. 30 g 1   hydrOXYzine (ATARAX/VISTARIL) 25 MG tablet Take 1 tablet (25 mg total) by mouth at bedtime. (Patient taking differently: Take 25 mg by mouth at bedtime as needed.) 30 tablet 5   hyoscyamine (LEVBID) 0.375 MG 12 hr tablet TAKE 1 TABLET BY MOUTH EVERY 12 HOURS AS NEEDED 180 tablet 0   meloxicam (MOBIC) 7.5 MG tablet TAKE 1 TABLET BY MOUTH EVERY DAY 30 tablet 0   Multiple Vitamin (MULTIVITAMIN WITH MINERALS) TABS tablet Take 1 tablet by mouth daily.     mupirocin ointment (BACTROBAN) 2 % Apply 1 application topically 2 (two) times daily. 22 g 0   ondansetron (ZOFRAN ODT) 4 MG disintegrating tablet Take 1 tablet (4 mg total) by mouth every 8 (eight) hours as needed for nausea or vomiting. 20 tablet 0   phenazopyridine (PYRIDIUM) 200 MG tablet Take 1 tablet (200 mg total) by mouth 3 (three) times daily as needed. 6 tablet 0   predniSONE (DELTASONE) 20 MG tablet Take 1 tablet (20 mg total) by mouth daily with breakfast. 7 tablet 0   SUMAtriptan (IMITREX) 100 MG tablet TAKE 1 TABLET BY MOUTH AS NEEDED FOR MIGRAINE HEADACHE, MAY REPEAT ONCE IN TWO HOURS AS NEEDED BUT NO MORE THAN 2 IN 24 HOURS. 9 tablet 4   tiZANidine (ZANAFLEX) 2 MG tablet Take 1 tablet (2 mg total) by mouth at bedtime. 10 tablet 0   traZODone (DESYREL) 50 MG tablet Take 2 tablets (177m) daily at bedtime. 60 tablet 1   valACYclovir (VALTREX) 500 MG tablet Take one tablet po bid x 3 days as needed. 30 tablet 3   amoxicillin-clavulanate (AUGMENTIN) 875-125 MG tablet Take 1 tablet by mouth 2 (two) times daily. 20 tablet 0   No facility-administered medications prior to visit.    Allergies  Allergen Reactions   Sulfa Antibiotics Hives     ROS Review of Systems  Constitutional:  Negative for fatigue and unexpected weight change.  HENT:  Negative for trouble swallowing.   Respiratory:  Negative for cough.   Cardiovascular:  Negative for chest pain.  Gastrointestinal:  Negative for abdominal pain.     Objective:    Physical Exam Vitals reviewed.  Cardiovascular:     Rate and Rhythm: Normal rate and regular rhythm.  Pulmonary:     Effort: Pulmonary effort is normal.     Breath sounds: Normal breath sounds.  Neurological:     Mental Status: She is alert.    BP 140/80 (BP Location: Left Arm, Patient Position: Sitting, Cuff Size: Normal)    Pulse 61    Temp 97.6 F (36.4 C) (Oral)    Wt 142 lb 9.6 oz (64.7 kg)    LMP 06/07/2010 (Approximate)    SpO2 98%    BMI 24.48 kg/m  Wt Readings from Last 3 Encounters:  05/22/21 142 lb 9.6 oz (64.7 kg)  05/15/21 145 lb (65.8 kg)  04/29/21 146 lb (66.2 kg)     Health Maintenance Due  Topic Date Due   COVID-19 Vaccine (1) Never done   Pneumococcal Vaccine [id number] old (1 - PCV) Never done   HIV Screening  Never done   PAP SMEAR-Modifier  04/18/2021    There are no preventive care reminders to display for this patient.  Lab Results  Component Value Date   TSH 1.78 03/03/2021   Lab Results  Component Value Date   WBC 5.2 03/03/2021   HGB 11.9 (L) 03/03/2021   HCT  36.5 03/03/2021   MCV 88.5 03/03/2021   PLT 296.0 03/03/2021   Lab Results  Component Value Date   NA 132 (L) 03/03/2021   K 3.9 03/03/2021   CO2 27 03/03/2021   GLUCOSE 90 03/03/2021   BUN 15 03/03/2021   CREATININE 0.98 03/03/2021   BILITOT 0.4 03/03/2021   ALKPHOS 43 03/03/2021   AST 18 03/03/2021   ALT 14 03/03/2021   PROT 6.6 03/03/2021   ALBUMIN 3.9 03/03/2021   CALCIUM 8.9 03/03/2021   CALCIUM 8.8 03/03/2021   ANIONGAP 12 09/26/2019   GFR 62.87 03/03/2021   Lab Results  Component Value Date   CHOL 180 10/30/2020   Lab Results  Component Value Date   HDL 75.40 10/30/2020    Lab Results  Component Value Date   LDLCALC 96 10/30/2020   Lab Results  Component Value Date   TRIG 39.0 10/30/2020   Lab Results  Component Value Date   CHOLHDL 2 10/30/2020   Lab Results  Component Value Date   HGBA1C 5.9 05/02/2017      Assessment & Plan:   Osteopenia with borderline osteoporosis.  Patient had been recommended Prolia therapy after one-time intolerance with Fosamax.  She has concerns predominantly with potential for accelerated bone loss if she did not maintain therapy on Prolia.  -She has decided after much thought to try the Fosamax once more.  We reviewed proper use of Fosamax with taking on empty stomach first thing in the morning and remaining upright and taking with full 8 ounces of water. -If she is having intolerance with Fosamax with repeat trial she will consider other therapies. -We have again reiterated the importance of adequate vitamin D, adequate daily calcium, and regular weightbearing exercise  No orders of the defined types were placed in this encounter.   Follow-up: No follow-ups on file.    Carolann Littler, MD

## 2021-05-22 NOTE — Patient Instructions (Addendum)
Trigger point injections today Dhea 50-100mg  for 4 weeks then 2 weeks off Vit C 500mg  Consider discontinuing hormones See you again in 3-4 weeks

## 2021-05-22 NOTE — Assessment & Plan Note (Signed)
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  Discussed with patient at great length.  I attempted osteopathic manipulation with some very mild resolution for this chronic problem with exacerbation.  Also given some trigger point injections and hoping that it will be beneficial as well.  Discussed icing regimen and home exercises.  Increase activity slowly.  Follow-up again in 6 to 8 weeks.

## 2021-05-23 ENCOUNTER — Other Ambulatory Visit: Payer: Self-pay | Admitting: Family Medicine

## 2021-05-25 DIAGNOSIS — F4312 Post-traumatic stress disorder, chronic: Secondary | ICD-10-CM | POA: Diagnosis not present

## 2021-05-27 ENCOUNTER — Encounter: Payer: Self-pay | Admitting: Family Medicine

## 2021-05-28 DIAGNOSIS — E041 Nontoxic single thyroid nodule: Secondary | ICD-10-CM | POA: Diagnosis not present

## 2021-05-28 DIAGNOSIS — M47816 Spondylosis without myelopathy or radiculopathy, lumbar region: Secondary | ICD-10-CM | POA: Diagnosis not present

## 2021-05-28 DIAGNOSIS — E871 Hypo-osmolality and hyponatremia: Secondary | ICD-10-CM | POA: Diagnosis not present

## 2021-05-28 DIAGNOSIS — E279 Disorder of adrenal gland, unspecified: Secondary | ICD-10-CM | POA: Diagnosis not present

## 2021-06-02 ENCOUNTER — Encounter (HOSPITAL_COMMUNITY): Payer: Self-pay

## 2021-06-02 ENCOUNTER — Other Ambulatory Visit: Payer: Self-pay

## 2021-06-02 ENCOUNTER — Emergency Department (HOSPITAL_COMMUNITY): Payer: BC Managed Care – PPO

## 2021-06-02 ENCOUNTER — Emergency Department (HOSPITAL_COMMUNITY)
Admission: EM | Admit: 2021-06-02 | Discharge: 2021-06-03 | Disposition: A | Discharge status: Other Acute Inpatient Hospital | Payer: BC Managed Care – PPO | Source: Home / Self Care | Attending: Emergency Medicine | Admitting: Emergency Medicine

## 2021-06-02 DIAGNOSIS — Z811 Family history of alcohol abuse and dependence: Secondary | ICD-10-CM | POA: Diagnosis not present

## 2021-06-02 DIAGNOSIS — Z87891 Personal history of nicotine dependence: Secondary | ICD-10-CM | POA: Insufficient documentation

## 2021-06-02 DIAGNOSIS — T5891XA Toxic effect of carbon monoxide from unspecified source, accidental (unintentional), initial encounter: Secondary | ICD-10-CM | POA: Diagnosis not present

## 2021-06-02 DIAGNOSIS — F410 Panic disorder [episodic paroxysmal anxiety] without agoraphobia: Secondary | ICD-10-CM | POA: Diagnosis not present

## 2021-06-02 DIAGNOSIS — Z91410 Personal history of adult physical and sexual abuse: Secondary | ICD-10-CM | POA: Diagnosis not present

## 2021-06-02 DIAGNOSIS — T5802XA Toxic effect of carbon monoxide from motor vehicle exhaust, intentional self-harm, initial encounter: Secondary | ICD-10-CM | POA: Diagnosis not present

## 2021-06-02 DIAGNOSIS — T582X2A Toxic effect of carbon monoxide from incomplete combustion of other domestic fuels, intentional self-harm, initial encounter: Secondary | ICD-10-CM | POA: Insufficient documentation

## 2021-06-02 DIAGNOSIS — I10 Essential (primary) hypertension: Secondary | ICD-10-CM | POA: Diagnosis not present

## 2021-06-02 DIAGNOSIS — Z96643 Presence of artificial hip joint, bilateral: Secondary | ICD-10-CM | POA: Insufficient documentation

## 2021-06-02 DIAGNOSIS — R45851 Suicidal ideations: Secondary | ICD-10-CM | POA: Diagnosis not present

## 2021-06-02 DIAGNOSIS — F4310 Post-traumatic stress disorder, unspecified: Secondary | ICD-10-CM | POA: Diagnosis not present

## 2021-06-02 DIAGNOSIS — G4700 Insomnia, unspecified: Secondary | ICD-10-CM | POA: Diagnosis not present

## 2021-06-02 DIAGNOSIS — Z8249 Family history of ischemic heart disease and other diseases of the circulatory system: Secondary | ICD-10-CM | POA: Diagnosis not present

## 2021-06-02 DIAGNOSIS — Z981 Arthrodesis status: Secondary | ICD-10-CM | POA: Diagnosis not present

## 2021-06-02 DIAGNOSIS — F332 Major depressive disorder, recurrent severe without psychotic features: Secondary | ICD-10-CM | POA: Insufficient documentation

## 2021-06-02 DIAGNOSIS — T1491XA Suicide attempt, initial encounter: Secondary | ICD-10-CM

## 2021-06-02 DIAGNOSIS — Z96652 Presence of left artificial knee joint: Secondary | ICD-10-CM | POA: Insufficient documentation

## 2021-06-02 DIAGNOSIS — K219 Gastro-esophageal reflux disease without esophagitis: Secondary | ICD-10-CM | POA: Diagnosis not present

## 2021-06-02 DIAGNOSIS — Z79899 Other long term (current) drug therapy: Secondary | ICD-10-CM | POA: Insufficient documentation

## 2021-06-02 DIAGNOSIS — E559 Vitamin D deficiency, unspecified: Secondary | ICD-10-CM | POA: Diagnosis not present

## 2021-06-02 DIAGNOSIS — Y900 Blood alcohol level of less than 20 mg/100 ml: Secondary | ICD-10-CM | POA: Insufficient documentation

## 2021-06-02 DIAGNOSIS — Z20822 Contact with and (suspected) exposure to covid-19: Secondary | ICD-10-CM | POA: Insufficient documentation

## 2021-06-02 DIAGNOSIS — N9489 Other specified conditions associated with female genital organs and menstrual cycle: Secondary | ICD-10-CM | POA: Insufficient documentation

## 2021-06-02 DIAGNOSIS — F411 Generalized anxiety disorder: Secondary | ICD-10-CM | POA: Diagnosis not present

## 2021-06-02 DIAGNOSIS — M797 Fibromyalgia: Secondary | ICD-10-CM | POA: Diagnosis not present

## 2021-06-02 DIAGNOSIS — K589 Irritable bowel syndrome without diarrhea: Secondary | ICD-10-CM | POA: Diagnosis not present

## 2021-06-02 LAB — RAPID URINE DRUG SCREEN, HOSP PERFORMED
Amphetamines: NOT DETECTED
Barbiturates: NOT DETECTED
Benzodiazepines: NOT DETECTED
Cocaine: NOT DETECTED
Opiates: NOT DETECTED
Tetrahydrocannabinol: NOT DETECTED

## 2021-06-02 LAB — RESP PANEL BY RT-PCR (FLU A&B, COVID) ARPGX2
Influenza A by PCR: NEGATIVE
Influenza B by PCR: NEGATIVE
SARS Coronavirus 2 by RT PCR: NEGATIVE

## 2021-06-02 LAB — CBC WITH DIFFERENTIAL/PLATELET
Abs Immature Granulocytes: 0.02 10*3/uL (ref 0.00–0.07)
Basophils Absolute: 0 10*3/uL (ref 0.0–0.1)
Basophils Relative: 0 %
Eosinophils Absolute: 0 10*3/uL (ref 0.0–0.5)
Eosinophils Relative: 0 %
HCT: 37.5 % (ref 36.0–46.0)
Hemoglobin: 12.6 g/dL (ref 12.0–15.0)
Immature Granulocytes: 0 %
Lymphocytes Relative: 26 %
Lymphs Abs: 2.1 10*3/uL (ref 0.7–4.0)
MCH: 29.9 pg (ref 26.0–34.0)
MCHC: 33.6 g/dL (ref 30.0–36.0)
MCV: 89.1 fL (ref 80.0–100.0)
Monocytes Absolute: 0.7 10*3/uL (ref 0.1–1.0)
Monocytes Relative: 9 %
Neutro Abs: 5.2 10*3/uL (ref 1.7–7.7)
Neutrophils Relative %: 65 %
Platelets: 308 10*3/uL (ref 150–400)
RBC: 4.21 MIL/uL (ref 3.87–5.11)
RDW: 12.3 % (ref 11.5–15.5)
WBC: 8 10*3/uL (ref 4.0–10.5)
nRBC: 0 % (ref 0.0–0.2)

## 2021-06-02 LAB — URINALYSIS, ROUTINE W REFLEX MICROSCOPIC
Bilirubin Urine: NEGATIVE
Glucose, UA: NEGATIVE mg/dL
Hgb urine dipstick: NEGATIVE
Ketones, ur: NEGATIVE mg/dL
Leukocytes,Ua: NEGATIVE
Nitrite: NEGATIVE
Protein, ur: NEGATIVE mg/dL
Specific Gravity, Urine: 1.005 (ref 1.005–1.030)
pH: 6 (ref 5.0–8.0)

## 2021-06-02 LAB — COMPREHENSIVE METABOLIC PANEL
ALT: 15 U/L (ref 0–44)
AST: 18 U/L (ref 15–41)
Albumin: 4 g/dL (ref 3.5–5.0)
Alkaline Phosphatase: 33 U/L — ABNORMAL LOW (ref 38–126)
Anion gap: 8 (ref 5–15)
BUN: 10 mg/dL (ref 6–20)
CO2: 23 mmol/L (ref 22–32)
Calcium: 8.7 mg/dL — ABNORMAL LOW (ref 8.9–10.3)
Chloride: 96 mmol/L — ABNORMAL LOW (ref 98–111)
Creatinine, Ser: 0.76 mg/dL (ref 0.44–1.00)
GFR, Estimated: >60 mL/min (ref >60)
Glucose, Bld: 99 mg/dL (ref 70–99)
Potassium: 4.1 mmol/L (ref 3.5–5.1)
Sodium: 127 mmol/L — ABNORMAL LOW (ref 135–145)
Total Bilirubin: 0.4 mg/dL (ref 0.3–1.2)
Total Protein: 7 g/dL (ref 6.5–8.1)

## 2021-06-02 LAB — HCG, QUANTITATIVE, PREGNANCY: hCG, Beta Chain, Quant, S: 4 m[IU]/mL (ref <5)

## 2021-06-02 LAB — CBG MONITORING, ED: Glucose-Capillary: 95 mg/dL (ref 70–99)

## 2021-06-02 LAB — LACTIC ACID, PLASMA
Lactic Acid, Venous: 0.7 mmol/L (ref 0.5–1.9)
Lactic Acid, Venous: 0.8 mmol/L (ref 0.5–1.9)

## 2021-06-02 LAB — CARBOXYHEMOGLOBIN - COOX
Carboxyhemoglobin: 2.6 % — ABNORMAL HIGH (ref 0.5–1.5)
Carboxyhemoglobin: 1.2 % (ref 0.5–1.5)

## 2021-06-02 LAB — TROPONIN I (HIGH SENSITIVITY): Troponin I (High Sensitivity): 3 ng/L (ref <18)

## 2021-06-02 LAB — ETHANOL: Alcohol, Ethyl (B): <10 mg/dL (ref <10)

## 2021-06-02 LAB — SALICYLATE LEVEL: Salicylate Lvl: <7 mg/dL — ABNORMAL LOW (ref 7.0–30.0)

## 2021-06-02 LAB — ACETAMINOPHEN LEVEL: Acetaminophen (Tylenol), Serum: <10 ug/mL — ABNORMAL LOW (ref 10–30)

## 2021-06-02 MED ORDER — LORAZEPAM 1 MG PO TABS
1.0000 mg | ORAL_TABLET | Freq: Once | ORAL | Status: AC
  Administered 2021-06-02: 20:00:00 1 mg via ORAL
  Filled 2021-06-02: qty 1

## 2021-06-02 MED ORDER — HYDROXYZINE HCL 25 MG PO TABS
25.0000 mg | ORAL_TABLET | Freq: Every evening | ORAL | Status: DC | PRN
  Administered 2021-06-02: 23:00:00 25 mg via ORAL
  Filled 2021-06-02: qty 1

## 2021-06-02 MED ORDER — LORAZEPAM 2 MG/ML IJ SOLN
1.0000 mg | Freq: Once | INTRAMUSCULAR | Status: AC
  Administered 2021-06-02: 16:00:00 1 mg via INTRAVENOUS
  Filled 2021-06-02: qty 1

## 2021-06-02 MED ORDER — ACETAMINOPHEN 500 MG PO TABS
1000.0000 mg | ORAL_TABLET | Freq: Once | ORAL | Status: AC
  Administered 2021-06-02: 06:00:00 1000 mg via ORAL
  Filled 2021-06-02: qty 2

## 2021-06-02 MED ORDER — LORAZEPAM 2 MG/ML IJ SOLN
1.0000 mg | Freq: Once | INTRAMUSCULAR | Status: AC
  Administered 2021-06-02: 08:00:00 1 mg via INTRAVENOUS
  Filled 2021-06-02: qty 1

## 2021-06-02 MED ORDER — TRAZODONE HCL 100 MG PO TABS
100.0000 mg | ORAL_TABLET | Freq: Every day | ORAL | Status: DC
  Administered 2021-06-02: 23:00:00 100 mg via ORAL
  Filled 2021-06-02: qty 1

## 2021-06-02 MED ORDER — SUMATRIPTAN SUCCINATE 50 MG PO TABS: 100.0000 mg | ORAL_TABLET | ORAL | Status: DC

## 2021-06-02 MED ORDER — FLUTICASONE PROPIONATE 50 MCG/ACT NA SUSP: 2.0000 | Freq: Every day | NASAL | Status: DC | PRN

## 2021-06-02 MED ORDER — ACETAMINOPHEN 500 MG PO TABS
1000.0000 mg | ORAL_TABLET | Freq: Once | ORAL | Status: AC
  Administered 2021-06-02: 1000 mg via ORAL
  Filled 2021-06-02: qty 2

## 2021-06-02 MED ORDER — LORAZEPAM 0.5 MG PO TABS
0.5000 mg | ORAL_TABLET | Freq: Once | ORAL | Status: AC
  Administered 2021-06-02: 11:00:00 0.5 mg via ORAL
  Filled 2021-06-02: qty 1

## 2021-06-02 MED ORDER — PANTOPRAZOLE SODIUM 40 MG PO TBEC: 80.0000 mg | DELAYED_RELEASE_TABLET | Freq: Every day | ORAL | Status: DC

## 2021-06-02 MED ORDER — HYDROXYZINE HCL 25 MG PO TABS
25.0000 mg | ORAL_TABLET | Freq: Once | ORAL | Status: AC
  Administered 2021-06-02: 14:00:00 25 mg via ORAL
  Filled 2021-06-02: qty 1

## 2021-06-02 MED ORDER — SUMATRIPTAN SUCCINATE 50 MG PO TABS
100.0000 mg | ORAL_TABLET | ORAL | Status: DC | PRN
  Filled 2021-06-02: qty 2

## 2021-06-02 MED ORDER — ESCITALOPRAM OXALATE 10 MG PO TABS
10.0000 mg | ORAL_TABLET | Freq: Every day | ORAL | Status: DC
  Administered 2021-06-02: 23:00:00 10 mg via ORAL
  Filled 2021-06-02: qty 1

## 2021-06-02 NOTE — Progress Notes (Signed)
Pt is being reviewed by [facility] Wynonia Hazard, RN.   Benjaman Kindler, MSW, [facility] 06/02/2021 11:41 PM

## 2021-06-02 NOTE — ED Notes (Signed)
Pt ambulatory from the bathroom, pt reports increasing anxiety, pt requests something to drink, drink provided.

## 2021-06-02 NOTE — ED Notes (Signed)
Pt in bed in hallway, sitter at bedside, pt's belongings secured, two bags of belongings, pt c/o anxiety, pt states that she tied to kill herself yesterday by being in her vehicle and running it in the garage, pt requests to be moved into a room, charge rn notified, no rooms available at this time.  Pt ambulatory to bathroom with steady gait, per PA pt needs to be on 2L O2, pt placed on 2L O2 via [place]

## 2021-06-02 NOTE — Progress Notes (Deleted)
Gabriela Shepard D.[place] [address] Phone: [phone]   Assessment and Plan:     There are no diagnoses linked to this encounter.  *** - Patient has received significant relief with OMT in the past.  Elects for repeat OMT today.  Tolerated well per note below. - Decision today to treat with OMT was based on Physical Exam   After verbal consent patient was treated with HVLA (high velocity low amplitude), ME (muscle energy), FPR (flex positional release), ST (soft tissue), PC/PD (Pelvic Compression/ Pelvic Decompression) techniques in cervical, rib, thoracic, lumbar, and pelvic areas. Patient tolerated the procedure well with improvement in symptoms.  Patient educated on potential side effects of soreness and recommended to rest, hydrate, and use Tylenol as needed for pain control.   Pertinent previous records reviewed include ***   Follow Up: ***     Subjective:   I, Gabriela Shepard, am serving as a Education administrator for Doctor [name]  Chief Complaint: back and neck pain   HPI:  05/22/2021 Gabriela Shepard is a 60 y.o. female coming in with complaint of back and neck pain. OMT on 05/15/2021 with Dr. [name]. Patient states that her back is improving.  States that it is very slow overall.  Does feel that manipulation has been helpful.  Patient has seen another doctor recently and has been put on different estrogen, progesterone and testosterone recently.  06/03/2021 Patient states    Relevant Historical Information: ***  Additional pertinent review of systems negative.  Current Outpatient Medications  Medication Sig Dispense Refill   AIMOVIG 70 MG/ML SOAJ INJECT 70 MG INTO THE SKIN EVERY 30 (THIRTY) DAYS. 3 mL 1   Cholecalciferol (VITAMIN D3) 1.25 MG (50000 UT) CAPS TAKE 1 CAPSULE BY MOUTH ONE TIME PER WEEK (Patient taking differently: Take 50,000 Units by mouth once a week. Sunday) 12 capsule 3   clonazePAM (KLONOPIN) 1 MG  tablet Take 1 tablet (1 mg total) by mouth 2 (two) times daily. (Patient taking differently: Take 1 mg by mouth daily as needed for anxiety.) 60 tablet 3   clotrimazole-betamethasone (LOTRISONE) cream APPLY TO AFFECTED AREA TWICE A DAY (Patient taking differently: 2 (two) times daily as needed (rash).) 30 g 2   denosumab (PROLIA) 60 MG/ML SOSY injection Inject 60 mg into the skin every 6 (six) months. (Patient not taking: Reported on 06/02/2021) 1 mL 1   DHEA 50 MG TABS Take 50 mg by mouth 2 (two) times daily.     escitalopram (LEXAPRO) 10 MG tablet TAKE 1 TABLET BY MOUTH EVERY DAY (Patient taking differently: Take 10 mg by mouth at bedtime.) 90 tablet 2   esomeprazole (NEXIUM) 40 MG capsule TAKE 1 CAPSULE BY MOUTH EVERY DAY (Patient taking differently: 40 mg daily as needed (for acid reflux).) 90 capsule 3   Estradiol 10 MCG TABS vaginal tablet Place 1 tablet (10 mcg total) vaginally 2 (two) times a week. 8 tablet 10   fluticasone (FLONASE) 50 MCG/ACT nasal spray Place 2 sprays into both nostrils daily. (Patient taking differently: Place 2 sprays into both nostrils daily as needed for allergies.) 16 g 11   hydrocortisone (ANUSOL-HC) 2.5 % rectal cream Place rectally 2 (two) times daily. Use as needed for hemorrhoids. 30 g 1   hydrOXYzine (ATARAX/VISTARIL) 25 MG tablet Take 1 tablet (25 mg total) by mouth at bedtime. (Patient taking differently: Take 25 mg by mouth at bedtime as needed (for bladder (interstitial cysitis)).) 30 tablet  5   hyoscyamine (LEVBID) 0.375 MG 12 hr tablet TAKE 1 TABLET BY MOUTH EVERY 12 HOURS AS NEEDED (Patient taking differently: 0.375 mg daily as needed for cramping.) 180 tablet 0   meloxicam (MOBIC) 7.5 MG tablet TAKE 1 TABLET BY MOUTH EVERY DAY (Patient taking differently: Take 7.5 mg by mouth daily as needed (for back spasms).) 30 tablet 0   Multiple Vitamin (MULTIVITAMIN WITH MINERALS) TABS tablet Take 1 tablet by mouth daily.     mupirocin ointment (BACTROBAN) 2 % Apply 1  application topically 2 (two) times daily. (Patient taking differently: Apply 1 application topically daily as needed (for rash).) 22 g 0   ondansetron (ZOFRAN ODT) 4 MG disintegrating tablet Take 1 tablet (4 mg total) by mouth every 8 (eight) hours as needed for nausea or vomiting. 20 tablet 0   phenazopyridine (PYRIDIUM) 200 MG tablet Take 1 tablet (200 mg total) by mouth 3 (three) times daily as needed. 6 tablet 0   SUMAtriptan (IMITREX) 100 MG tablet TAKE 1 TABLET BY MOUTH AS NEEDED FOR MIGRAINE HEADACHE, MAY REPEAT ONCE IN TWO HOURS AS NEEDED BUT NO MORE THAN 2 IN 24 HOURS. (Patient taking differently: Take 100 mg by mouth See admin instructions. TAKE 100 MG BY MOUTH AS NEEDED FOR MIGRAINE HEADACHE, MAY REPEAT ONCE IN TWO HOURS AS NEEDED BUT NO MORE THAN 2 IN 24 HOURS.) 9 tablet 4   Testosterone 1.62 % GEL Place onto the skin.     tiZANidine (ZANAFLEX) 2 MG tablet Take 1 tablet (2 mg total) by mouth at bedtime. 10 tablet 0   traZODone (DESYREL) 50 MG tablet Take 2 tablets (100mg ) daily at bedtime. 60 tablet 1   valACYclovir (VALTREX) 500 MG tablet Take one tablet po bid x 3 days as needed. 30 tablet 3   No current facility-administered medications for this visit.      Objective:     There were no vitals filed for this visit.    There is no height or weight on file to calculate BMI.    Physical Exam:     General: Well-appearing, cooperative, sitting comfortably in no acute distress.   OMT Physical Exam:  ASIS Compression Test: Positive Right Cervical: TTP paraspinal, *** Rib: Bilateral elevated first rib with TTP Thoracic: TTP paraspinal,*** Lumbar: TTP paraspinal,*** Pelvis: Right anterior innominate  Electronically signed by:  Gabriela Shepard D.[facility] 12:30 PM 06/02/21

## 2021-06-02 NOTE — ED Notes (Signed)
Pt sitting up in bed, sitter at bedside, pt states that she is still having some anxiety, states that it feels like a fluttering in her chest, pt also requests her iv out,  PA notified, ekg ordered,

## 2021-06-02 NOTE — ED Provider Notes (Signed)
Care assumed from [facility], PA-C at shift change pending repeat carboxyhemoglobin. See her note for full HPI.  In short, patient is a 60 year old female who presents to the ED suicidal ideations.  Patient under IVC.  Patient was found in her running vehicle in an enclosed garage as an attempted suicide.  Plan from previous provider. Place patient on [place], and recheck carboxyhemoglobin in 2 hours. If normal, patient can be medically cleared for TTS evaluation.   Physical Exam  BP 136/89 (BP Location: Right Arm)    Pulse 81    Temp 97.7 F (36.5 C) (Oral)    Resp 18    Ht 5\' 4"  (1.626 m)    Wt 68 kg    LMP 06/07/2010 (Approximate)    SpO2 100%    BMI 25.75 kg/m   Physical Exam Vitals and nursing note reviewed.  Constitutional:      General: She is not in acute distress.    Appearance: She is not ill-appearing.  HENT:     Head: Normocephalic.  Eyes:     Pupils: Pupils are equal, round, and reactive to light.  Cardiovascular:     Rate and Rhythm: Normal rate and regular rhythm.     Pulses: Normal pulses.     Heart sounds: Normal heart sounds. No murmur heard.   No friction rub. No gallop.  Pulmonary:     Effort: Pulmonary effort is normal.     Breath sounds: Normal breath sounds.  Abdominal:     General: Abdomen is flat. There is no distension.     Palpations: Abdomen is soft.     Tenderness: There is no abdominal tenderness. There is no guarding or rebound.  Musculoskeletal:        General: Normal range of motion.     Cervical back: Neck supple.  Skin:    General: Skin is warm and dry.  Neurological:     General: No focal deficit present.     Mental Status: She is alert.  Psychiatric:        Mood and Affect: Mood normal.        Behavior: Behavior normal.    ED Course/Procedures    Results for orders placed or performed during the hospital encounter of 06/02/21 (from the past 24 hour(s))  Urine rapid drug screen (hosp performed)     Status: None   Collection  Time: 06/02/21  5:13 AM  Result Value Ref Range   Opiates NONE DETECTED NONE DETECTED   Cocaine NONE DETECTED NONE DETECTED   Benzodiazepines NONE DETECTED NONE DETECTED   Amphetamines NONE DETECTED NONE DETECTED   Tetrahydrocannabinol NONE DETECTED NONE DETECTED   Barbiturates NONE DETECTED NONE DETECTED  Urinalysis, Routine w reflex microscopic Urine, Clean Catch     Status: Abnormal   Collection Time: 06/02/21  5:13 AM  Result Value Ref Range   Color, Urine YELLOW YELLOW   APPearance CLEAR CLEAR   Specific Gravity, Urine 1.005 1.005 - 1.030   pH 6.0 5.0 - 8.0   Glucose, UA NEGATIVE NEGATIVE mg/dL   Hgb urine dipstick NEGATIVE NEGATIVE   Bilirubin Urine NEGATIVE NEGATIVE   Ketones, ur NEGATIVE NEGATIVE mg/dL   Protein, ur NEGATIVE NEGATIVE mg/dL   Nitrite NEGATIVE NEGATIVE   Leukocytes,Ua NEGATIVE NEGATIVE   RBC / HPF 0-5 0 - 5 RBC/hpf   WBC, UA 0-5 0 - 5 WBC/hpf   Bacteria, UA FEW (A) NONE SEEN   Squamous Epithelial / LPF 0-5 0 - 5  Comprehensive  metabolic panel     Status: Abnormal   Collection Time: 06/02/21  5:15 AM  Result Value Ref Range   Sodium 127 (L) 135 - 145 mmol/L   Potassium 4.1 3.5 - 5.1 mmol/L   Chloride 96 (L) 98 - 111 mmol/L   CO2 23 22 - 32 mmol/L   Glucose, Bld 99 70 - 99 mg/dL   BUN 10 6 - 20 mg/dL   Creatinine, Ser 0.76 0.44 - 1.00 mg/dL   Calcium 8.7 (L) 8.9 - 10.3 mg/dL   Total Protein 7.0 6.5 - 8.1 g/dL   Albumin 4.0 3.5 - 5.0 g/dL   AST 18 15 - 41 U/L   ALT 15 0 - 44 U/L   Alkaline Phosphatase 33 (L) 38 - 126 U/L   Total Bilirubin 0.4 0.3 - 1.2 mg/dL   GFR, Estimated >60 >60 mL/min   Anion gap 8 5 - 15  Ethanol     Status: None   Collection Time: 06/02/21  5:15 AM  Result Value Ref Range   Alcohol, Ethyl (B) <10 <10 mg/dL  CBC with Diff     Status: None   Collection Time: 06/02/21  5:15 AM  Result Value Ref Range   WBC 8.0 4.0 - 10.5 K/uL   RBC 4.21 3.87 - 5.11 MIL/uL   Hemoglobin 12.6 12.0 - 15.0 g/dL   HCT 37.5 36.0 - 46.0 %   MCV  89.1 80.0 - 100.0 fL   MCH 29.9 26.0 - 34.0 pg   MCHC 33.6 30.0 - 36.0 g/dL   RDW 12.3 11.5 - 15.5 %   Platelets 308 150 - 400 K/uL   nRBC 0.0 0.0 - 0.2 %   Neutrophils Relative % 65 %   Neutro Abs 5.2 1.7 - 7.7 K/uL   Lymphocytes Relative 26 %   Lymphs Abs 2.1 0.7 - 4.0 K/uL   Monocytes Relative 9 %   Monocytes Absolute 0.7 0.1 - 1.0 K/uL   Eosinophils Relative 0 %   Eosinophils Absolute 0.0 0.0 - 0.5 K/uL   Basophils Relative 0 %   Basophils Absolute 0.0 0.0 - 0.1 K/uL   Immature Granulocytes 0 %   Abs Immature Granulocytes 0.02 0.00 - 0.07 K/uL  Acetaminophen level     Status: Abnormal   Collection Time: 06/02/21  5:15 AM  Result Value Ref Range   Acetaminophen (Tylenol), Serum <10 (L) 10 - 30 ug/mL  Salicylate level     Status: Abnormal   Collection Time: 06/02/21  5:15 AM  Result Value Ref Range   Salicylate Lvl <2.2 (L) 7.0 - 30.0 mg/dL  Troponin I (High Sensitivity)     Status: None   Collection Time: 06/02/21  5:15 AM  Result Value Ref Range   Troponin I (High Sensitivity) 3 <18 ng/L  hCG, quantitative, pregnancy     Status: None   Collection Time: 06/02/21  5:15 AM  Result Value Ref Range   hCG, Beta Chain, Quant, S 4 <5 mIU/mL  Carboxyhemoglobin (single result)     Status: Abnormal   Collection Time: 06/02/21  5:44 AM  Result Value Ref Range   Carboxyhemoglobin 2.6 (H) 0.5 - 1.5 %  Lactic acid, plasma     Status: None   Collection Time: 06/02/21  5:44 AM  Result Value Ref Range   Lactic Acid, Venous 0.7 0.5 - 1.9 mmol/L  CBG monitoring, ED     Status: None   Collection Time: 06/02/21  8:11 AM  Result Value Ref  Range   Glucose-Capillary 95 70 - 99 mg/dL  Resp Panel by RT-PCR (Flu A&B, Covid) Nasopharyngeal Swab     Status: None   Collection Time: 06/02/21  8:23 AM   Specimen: Nasopharyngeal Swab; Nasopharyngeal(NP) swabs in vial transport medium  Result Value Ref Range   SARS Coronavirus 2 by RT PCR NEGATIVE NEGATIVE   Influenza A by PCR NEGATIVE NEGATIVE    Influenza B by PCR NEGATIVE NEGATIVE  Lactic acid, plasma     Status: None   Collection Time: 06/02/21  8:23 AM  Result Value Ref Range   Lactic Acid, Venous 0.8 0.5 - 1.9 mmol/L  Carboxyhemoglobin (single result)     Status: None   Collection Time: 06/02/21 10:30 AM  Result Value Ref Range   Carboxyhemoglobin 1.2 0.5 - 1.5 %    Procedures  MDM  60 year old female here after a suicide attempt.  Previous provider had concerned about carbon monoxide poisoning given increased   11:09 AM repeat carboxyhemoglobin level within normal limits. No chest pain or shortness of breath. CXR negative for signs of pulmonary edema. Patient has been medically cleared for TTS evaluation. Home medications ordered by previous provider. Paperwork also filled out by previous provider.   The patient has been placed in psychiatric observation due to the need to provide a safe environment for the patient while obtaining psychiatric consultation and evaluation, as well as ongoing medical and medication management to treat the patient's condition.  The patient has been placed under full IVC at this time.     Suzy Bouchard, PA-C 06/02/21 1153    Blanchie Dessert, MD 06/04/21 1342

## 2021-06-02 NOTE — ED Notes (Addendum)
IVC Paperwork sent [phone] per RN request

## 2021-06-02 NOTE — BH Assessment (Signed)
Clinician messaged Vladimir Creeks, RN: "Hey. It's Trey with TTS. Is the pt able to be assessed? If so can the pt be placed in a private room and can you fax me the pt's IVC paperwork [phone])."   Clinician awaiting response.    Vertell Novak, [place], [facility], [facility] Triage Specialist [phone]
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
## 2021-06-02 NOTE — ED Notes (Signed)
Patient stated that her anxiety is heightened by the noise in the hallway

## 2021-06-02 NOTE — ED Provider Notes (Addendum)
[facility] DEPT Provider Note   CSN: [id number] Arrival date & time: 06/02/21  0149     History Chief Complaint  Patient presents with   IVC     Gabriela Shepard is a 60 y.o. female presents to the emergency department with [employer] after being found in her running vehicle.  Patient reports that she is the sole caregiver for her mother and that her children are mean to her.  She reports that she has felt overwhelmed recently and states she cannot handle life anymore.  Ports a history of substance abuse and addiction but states she has been clean 10 years.  Patient denies previous suicide attempt.  Reports a long history of anxiety and depression for which she takes medication and sees a therapist.  Reports she has been feeling suicidal for some time.  IVC states: " The respondent was found in her running vehicle in the enclosed garage of her house.  She had put a pipe from the exhaust of her car into the garage.  She wrote several suicide notes to friends and family members."  The history is provided by the patient, medical records and the police. No language interpreter was used.      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   History of UTI    Interstitial cystitis    Low sodium levels    Migraines    Osteoporosis    PONV (postoperative nausea and vomiting)    PTSD (post-traumatic stress disorder)    on meds   STD (sexually transmitted disease)    Substance abuse ([place])    hx of alcohol   Urine incontinence     Patient Active Problem List   Diagnosis Date Noted   Thyroid nodule 03/24/2021   Lung nodule 03/03/2021   Easy  bruising 03/03/2021   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   Polyarthralgia 10/28/2020   Interstitial cystitis 07/23/2020   Levator spasm 07/23/2020   Age-related osteoporosis without current pathological fracture 08/06/2019   Hyponatremia 08/06/2019   Multiple thyroid nodules 08/06/2019   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   Piriformis syndrome, right 08/09/2018   Weight gain 02/08/2018   Trigger point of right shoulder region 12/22/2017   Cervical radiculopathy at C6 11/29/2017   Osteopenia 03/09/2016   Primary osteoarthritis of left knee 12/23/2015   IBS (irritable bowel syndrome) 02/19/2015   Postmenopausal 12/19/2013   Migraine headache 12/05/2012   Pain, upper back 09/24/2010   Depression, recurrent ([place]) 09/04/2010    Past Surgical History:  Procedure Laterality Date   BREAST BIOPSY Left    2017 benign x 2   CESAREAN SECTION  1992   COLONOSCOPY  2018   JMP-MAC-suprep(good)-polyps   INCONTINENCE SURGERY     LAPAROSCOPY     under bladder   NECK SURGERY  02/24/2018   ACDF   POLYPECTOMY  2018   polyps removed   TOTAL HIP ARTHROPLASTY Right 09/08/2020   TOTAL HIP ARTHROPLASTY Left 2018   TOTAL KNEE ARTHROPLASTY Left 12/23/2015   Procedure: TOTAL KNEE ARTHROPLASTY;  Surgeon:  Melrose Nakayama, MD;  Location: [facility];  Service: Orthopedics;  Laterality: Left;     OB History     Gravida  2   Para  2   Term  2   Preterm      AB      Living  2      SAB      IAB      Ectopic      Multiple      Live Births  2           Family History  Problem Relation Age of Onset   Hypertension Mother    Lung cancer Mother    Kidney cancer Father    Ulcers Father    Heart disease Father    Drug abuse Sister    Alcoholism Brother    Hypertension Maternal Grandmother    Alcohol abuse Other    Arthritis Other    Hypertension Other    Mental illness Other    Healthy Daughter    Healthy Son    Colon cancer Neg Hx    Esophageal cancer Neg Hx    Colon polyps Neg  Hx    Rectal cancer Neg Hx    Stomach cancer Neg Hx     Social History   Tobacco Use   Smoking status: Former    Packs/day: 0.50    Years: 10.00    Pack years: 5.00    Types: Cigarettes    Quit date: 11/13/2010    Years since quitting: 10.5   Smokeless tobacco: Never  Vaping Use   Vaping Use: Never used  Substance Use Topics   Alcohol use: No    Comment: hx of ETOH abuse-sober since 11/2010    Drug use: No    Home Medications Prior to Admission medications   Medication Sig Start Date End Date Taking? Authorizing Provider  AIMOVIG 70 MG/ML SOAJ INJECT 70 MG INTO THE SKIN EVERY 30 (THIRTY) DAYS. 02/02/21  Yes Burchette, Alinda Sierras, MD  Cholecalciferol (VITAMIN D3) 1.25 MG (50000 UT) CAPS TAKE 1 CAPSULE BY MOUTH ONE TIME PER WEEK Patient taking differently: Take 50,000 Units by mouth once a week. Sunday 06/30/20  Yes Burchette, Alinda Sierras, MD  clonazePAM (KLONOPIN) 1 MG tablet Take 1 tablet (1 mg total) by mouth 2 (two) times daily. Patient taking differently: Take 1 mg by mouth daily as needed for anxiety. 12/09/20  Yes Burchette, Alinda Sierras, MD  clotrimazole-betamethasone (LOTRISONE) cream APPLY TO AFFECTED AREA TWICE A DAY Patient taking differently: 2 (two) times daily as needed (rash). 04/21/20  Yes Burchette, Alinda Sierras, MD  escitalopram (LEXAPRO) 10 MG tablet TAKE 1 TABLET BY MOUTH EVERY DAY Patient taking differently: Take 10 mg by mouth at bedtime. 05/08/21  Yes Burchette, Alinda Sierras, MD  esomeprazole (NEXIUM) 40 MG capsule TAKE 1 CAPSULE BY MOUTH EVERY DAY Patient taking differently: 40 mg daily as needed (for acid reflux). 02/14/20  Yes Lyndal Pulley, DO  Estradiol 10 MCG TABS vaginal tablet Place 1 tablet (10 mcg total) vaginally 2 (two) times a week. 01/19/21 01/18/22 Yes Amundson Raliegh Ip, MD  fluticasone (FLONASE) 50 MCG/ACT nasal spray Place 2 sprays into both nostrils daily. Patient taking differently: Place 2 sprays into both nostrils daily as needed for allergies. 03/24/21   Yes Burchette, Alinda Sierras, MD  hydrocortisone (ANUSOL-HC) 2.5 % rectal cream Place rectally 2 (two) times daily. Use as needed for hemorrhoids. 11/24/20  Yes Yisroel Ramming, Everardo All,  MD  hydrOXYzine (ATARAX/VISTARIL) 25 MG tablet Take 1 tablet (25 mg total) by mouth at bedtime. Patient taking differently: Take 25 mg by mouth at bedtime as needed (for bladder (interstitial cysitis)). 10/10/20  Yes Jaquita Folds, MD  hyoscyamine (LEVBID) 0.375 MG 12 hr tablet TAKE 1 TABLET BY MOUTH EVERY 12 HOURS AS NEEDED Patient taking differently: 0.375 mg daily as needed for cramping. 05/08/21  Yes Burchette, Alinda Sierras, MD  Multiple Vitamin (MULTIVITAMIN WITH MINERALS) TABS tablet Take 1 tablet by mouth daily.   Yes [provider]  mupirocin ointment (BACTROBAN) 2 % Apply 1 application topically 2 (two) times daily. Patient taking differently: Apply 1 application topically daily as needed (for rash). 02/23/21  Yes Burchette, Alinda Sierras, MD  ondansetron (ZOFRAN ODT) 4 MG disintegrating tablet Take 1 tablet (4 mg total) by mouth every 8 (eight) hours as needed for nausea or vomiting. 04/22/21  Yes Burchette, Alinda Sierras, MD  phenazopyridine (PYRIDIUM) 200 MG tablet Take 1 tablet (200 mg total) by mouth 3 (three) times daily as needed. 05/28/20  Yes Salvadore Dom, MD  SUMAtriptan (IMITREX) 100 MG tablet TAKE 1 TABLET BY MOUTH AS NEEDED FOR MIGRAINE HEADACHE, MAY REPEAT ONCE IN TWO HOURS AS NEEDED BUT NO MORE THAN 2 IN 24 HOURS. Patient taking differently: Take 100 mg by mouth See admin instructions. TAKE 100 MG BY MOUTH AS NEEDED FOR MIGRAINE HEADACHE, MAY REPEAT ONCE IN TWO HOURS AS NEEDED BUT NO MORE THAN 2 IN 24 HOURS. 05/25/21  Yes Burchette, Alinda Sierras, MD  tiZANidine (ZANAFLEX) 2 MG tablet Take 1 tablet (2 mg total) by mouth at bedtime. 04/27/21  Yes Lyndal Pulley, DO  traZODone (DESYREL) 50 MG tablet Take 2 tablets (11m) daily at bedtime. 05/19/21  Yes SLyndal Pulley DO  valACYclovir (VALTREX) 500  MG tablet Take one tablet po bid x 3 days as needed. 11/24/20  Yes ANunzio Cobbs MD  denosumab (PROLIA) 60 MG/ML SOSY injection Inject 60 mg into the skin every 6 (six) months. Patient not taking: Reported on 06/02/2021 05/19/21   BEulas Post MD  DHEA 50 MG TABS Take 50 mg by mouth 2 (two) times daily.    [provider]  meloxicam (MOBIC) 7.5 MG tablet TAKE 1 TABLET BY MOUTH EVERY DAY Patient taking differently: Take 7.5 mg by mouth daily as needed (for back spasms). 01/19/21   SLyndal Pulley DO  Testosterone 1.62 % GEL Place onto the skin.    [provider]    Allergies    Sulfa antibiotics  Review of Systems   Review of Systems  Constitutional:  Negative for appetite change, diaphoresis, fatigue, fever and unexpected weight change.  HENT:  Negative for mouth sores.   Eyes:  Negative for visual disturbance.  Respiratory:  Negative for cough, chest tightness, shortness of breath and wheezing.   Cardiovascular:  Negative for chest pain.  Gastrointestinal:  Positive for nausea. Negative for abdominal pain, constipation, diarrhea and vomiting.  Endocrine: Negative for polydipsia, polyphagia and polyuria.  Genitourinary:  Negative for dysuria, frequency, hematuria and urgency.  Musculoskeletal:  Negative for back pain and neck stiffness.  Skin:  Negative for rash.  Allergic/Immunologic: Negative for immunocompromised state.  Neurological:  Positive for headaches. Negative for syncope and light-headedness.  Hematological:  Does not bruise/bleed easily.  Psychiatric/Behavioral:  Positive for suicidal ideas. Negative for sleep disturbance. The patient is not nervous/anxious.    Physical Exam Updated Vital Signs BP 136/89 (BP Location:  Right Arm)    Pulse 81    Temp 97.7 F (36.5 C) (Oral)    Resp 18    Ht _0  (1.626 m)    Wt 68 kg    LMP 06/07/2010 (Approximate)    SpO2 100%    BMI 25.75 kg/m   Physical Exam Vitals and nursing note reviewed.   Constitutional:      General: She is not in acute distress.    Appearance: She is not diaphoretic.  HENT:     Head: Normocephalic.     Comments: flushed Eyes:     General: No scleral icterus.    Conjunctiva/sclera: Conjunctivae normal.  Cardiovascular:     Rate and Rhythm: Normal rate and regular rhythm.     Pulses: Normal pulses.          Radial pulses are 2+ on the right side and 2+ on the left side.  Pulmonary:     Effort: No tachypnea, accessory muscle usage, prolonged expiration, respiratory distress or retractions.     Breath sounds: No stridor.     Comments: Equal chest rise. No increased work of breathing. Abdominal:     General: There is no distension.     Palpations: Abdomen is soft.     Tenderness: There is no abdominal tenderness. There is no guarding or rebound.  Musculoskeletal:     Cervical back: Normal range of motion.     Comments: Moves all extremities equally and without difficulty.  Skin:    General: Skin is warm and dry.     Capillary Refill: Capillary refill takes less than 2 seconds.  Neurological:     Mental Status: She is alert.     GCS: GCS eye subscore is 4. GCS verbal subscore is 5. GCS motor subscore is 6.     Comments: Speech is clear and goal oriented.  Psychiatric:        Attention and Perception: Attention normal.        Mood and Affect: Mood normal. Affect is tearful.        Speech: Speech normal.        Behavior: Behavior is cooperative.        Thought Content: Thought content includes suicidal ideation. Thought content does not include homicidal ideation. Thought content includes suicidal plan. Thought content does not include homicidal plan.        Cognition and Memory: Cognition normal.    ED Results / Procedures / Treatments   Labs (all labs ordered are listed, but only abnormal results are displayed) Labs Reviewed  COMPREHENSIVE METABOLIC PANEL - Abnormal; Notable for the following components:      Result Value   Sodium 127 (*)     Chloride 96 (*)    Calcium 8.7 (*)    Alkaline Phosphatase 33 (*)    All other components within normal limits  ACETAMINOPHEN LEVEL - Abnormal; Notable for the following components:   Acetaminophen (Tylenol), Serum <10 (*)    All other components within normal limits  SALICYLATE LEVEL - Abnormal; Notable for the following components:   Salicylate Lvl <2.7 (*)    All other components within normal limits  URINALYSIS, ROUTINE W REFLEX MICROSCOPIC - Abnormal; Notable for the following components:   Bacteria, UA FEW (*)    All other components within normal limits  CARBOXYHEMOGLOBIN - COOX - Abnormal; Notable for the following components:   Carboxyhemoglobin 2.6 (*)    All other components within normal limits  RESP PANEL BY  RT-PCR (FLU A&B, COVID) ARPGX2  ETHANOL  RAPID URINE DRUG SCREEN, HOSP PERFORMED  CBC WITH DIFFERENTIAL/PLATELET  LACTIC ACID, PLASMA  HCG, QUANTITATIVE, PREGNANCY  LACTIC ACID, PLASMA  CBG MONITORING, ED  TROPONIN I (HIGH SENSITIVITY)     Radiology DG Chest 2 View  Result Date: 06/02/2021 CLINICAL DATA:  Carbon monoxide poisoning. EXAM: CHEST - 2 VIEW COMPARISON:  April 03, 2019 FINDINGS: The heart size and mediastinal contours are within normal limits. Both lungs are clear. A radiopaque fusion plate and screws are seen overlying the lower cervical spine. IMPRESSION: No active cardiopulmonary disease. Electronically Signed   By: Virgina Norfolk M.D.   On: 06/02/2021 04:49    Procedures Procedures   Medications Ordered in ED Medications  LORazepam (ATIVAN) injection 1 mg (has no administration in time range)  acetaminophen (TYLENOL) tablet 1,000 mg (1,000 mg Oral Given 06/02/21 0536)    ED Course  I have reviewed the triage vital signs and the nursing notes.  Pertinent labs & imaging results that were available during my care of the patient were reviewed by me and considered in my medical decision making (see chart for details).    MDM  Rules/Calculators/A&P                          Patient here after suicide attempt.  Alert and oriented.  Admits to feeling overwhelmed and suicidal.  Concern for CO poisoning.  Medical work-up initiated.  4:22 AM First exam completed.  6:10 AM Chest x-ray without evidence of pulmonary edema.  Additional labs including carboxyhemoglobin pending.  Care transferred to the oncoming dayshift who will follow labs, reevaluate and determine medical stability for TTS evaluation.  BP 136/89 (BP Location: Right Arm)    Pulse 81    Temp 97.7 F (36.5 C) (Oral)    Resp 18    Ht _0  (1.626 m)    Wt 68 kg    LMP 06/07/2010 (Approximate)    SpO2 100%    BMI 25.75 kg/m    6:38 AM Labs resulted with increased carboxyhemoglobin level.  Patient placed on oxygen.  She denies chest pain or shortness of breath at the moment.  Other labs are reassuring.  Chest x-ray without evidence of pulmonary edema.  Negative troponin.  Normal lactic acid.  Day team will follow, reevaluate and recheck carboxyhemoglobin before medical clearance.     Final Clinical Impression(s) / ED Diagnoses Final diagnoses:  Suicide attempt [facility])  Suicidal ideation    Rx / DC Orders ED Discharge Orders     None        Ruie Sendejo, Gwenlyn Perking 06/02/21 0612    Zayveon Raschke, Jarrett Soho, PA-C 06/02/21 1655    Merrily Pew, MD 06/02/21 [phone]

## 2021-06-02 NOTE — ED Notes (Signed)
ICU is tubing some ear plugs for the patient

## 2021-06-02 NOTE — ED Triage Notes (Signed)
Pt BIB Police with IVC paperwork. Pt was found in her garage naked in her car with the exhaust going into her car. Pt had several suicide notes.

## 2021-06-02 NOTE — ED Notes (Signed)
Gave report to Opal Sidles, RN at [facility] for transport to [facility] bed [id number]. Left number in case she had additional questions. Called [employer] for transport to [facility].

## 2021-06-02 NOTE — ED Notes (Signed)
Pt in bed, sitter at bedside, med given for heart fluttering, pt states that it is not pain, states that she has had the fluttering for the past week, states that it feels like when she is scared and her heart is racing/fluttering.  Pt heart rate 64, ekg complete

## 2021-06-03 ENCOUNTER — Encounter (HOSPITAL_COMMUNITY): Payer: Self-pay | Admitting: Student

## 2021-06-03 ENCOUNTER — Encounter (HOSPITAL_COMMUNITY): Payer: Self-pay

## 2021-06-03 ENCOUNTER — Ambulatory Visit: Payer: BC Managed Care – PPO | Admitting: Sports Medicine

## 2021-06-03 ENCOUNTER — Inpatient Hospital Stay (HOSPITAL_COMMUNITY)
Admission: AD | Admit: 2021-06-03 | Discharge: 2021-06-06 | DRG: 918 | Disposition: A | Discharge status: Home / Self Care | Payer: BC Managed Care – PPO | Source: Intra-hospital | Attending: Psychiatry | Admitting: Psychiatry

## 2021-06-03 DIAGNOSIS — G4700 Insomnia, unspecified: Secondary | ICD-10-CM | POA: Diagnosis present

## 2021-06-03 DIAGNOSIS — K589 Irritable bowel syndrome without diarrhea: Secondary | ICD-10-CM | POA: Diagnosis present

## 2021-06-03 DIAGNOSIS — Z881 Allergy status to other antibiotic agents status: Secondary | ICD-10-CM | POA: Diagnosis not present

## 2021-06-03 DIAGNOSIS — M1990 Unspecified osteoarthritis, unspecified site: Secondary | ICD-10-CM | POA: Diagnosis present

## 2021-06-03 DIAGNOSIS — Z20822 Contact with and (suspected) exposure to covid-19: Secondary | ICD-10-CM | POA: Diagnosis present

## 2021-06-03 DIAGNOSIS — K219 Gastro-esophageal reflux disease without esophagitis: Secondary | ICD-10-CM | POA: Diagnosis present

## 2021-06-03 DIAGNOSIS — Z96643 Presence of artificial hip joint, bilateral: Secondary | ICD-10-CM | POA: Diagnosis present

## 2021-06-03 DIAGNOSIS — Z87891 Personal history of nicotine dependence: Secondary | ICD-10-CM | POA: Diagnosis not present

## 2021-06-03 DIAGNOSIS — F4310 Post-traumatic stress disorder, unspecified: Secondary | ICD-10-CM | POA: Diagnosis present

## 2021-06-03 DIAGNOSIS — T5802XA Toxic effect of carbon monoxide from motor vehicle exhaust, intentional self-harm, initial encounter: Principal | ICD-10-CM | POA: Diagnosis present

## 2021-06-03 DIAGNOSIS — F332 Major depressive disorder, recurrent severe without psychotic features: Secondary | ICD-10-CM | POA: Diagnosis not present

## 2021-06-03 DIAGNOSIS — R7301 Impaired fasting glucose: Secondary | ICD-10-CM | POA: Insufficient documentation

## 2021-06-03 DIAGNOSIS — M810 Age-related osteoporosis without current pathological fracture: Secondary | ICD-10-CM | POA: Insufficient documentation

## 2021-06-03 DIAGNOSIS — R768 Other specified abnormal immunological findings in serum: Secondary | ICD-10-CM | POA: Insufficient documentation

## 2021-06-03 DIAGNOSIS — Z91410 Personal history of adult physical and sexual abuse: Secondary | ICD-10-CM | POA: Diagnosis not present

## 2021-06-03 DIAGNOSIS — K5900 Constipation, unspecified: Secondary | ICD-10-CM | POA: Diagnosis present

## 2021-06-03 DIAGNOSIS — Z811 Family history of alcohol abuse and dependence: Secondary | ICD-10-CM | POA: Diagnosis not present

## 2021-06-03 DIAGNOSIS — Z8249 Family history of ischemic heart disease and other diseases of the circulatory system: Secondary | ICD-10-CM | POA: Diagnosis not present

## 2021-06-03 DIAGNOSIS — E559 Vitamin D deficiency, unspecified: Secondary | ICD-10-CM | POA: Diagnosis present

## 2021-06-03 DIAGNOSIS — F410 Panic disorder [episodic paroxysmal anxiety] without agoraphobia: Secondary | ICD-10-CM | POA: Diagnosis present

## 2021-06-03 DIAGNOSIS — Z801 Family history of malignant neoplasm of trachea, bronchus and lung: Secondary | ICD-10-CM | POA: Diagnosis not present

## 2021-06-03 DIAGNOSIS — F411 Generalized anxiety disorder: Secondary | ICD-10-CM | POA: Diagnosis present

## 2021-06-03 DIAGNOSIS — Z8051 Family history of malignant neoplasm of kidney: Secondary | ICD-10-CM

## 2021-06-03 DIAGNOSIS — Z79899 Other long term (current) drug therapy: Secondary | ICD-10-CM

## 2021-06-03 DIAGNOSIS — J309 Allergic rhinitis, unspecified: Secondary | ICD-10-CM | POA: Diagnosis present

## 2021-06-03 DIAGNOSIS — R7989 Other specified abnormal findings of blood chemistry: Secondary | ICD-10-CM | POA: Diagnosis present

## 2021-06-03 DIAGNOSIS — M797 Fibromyalgia: Secondary | ICD-10-CM | POA: Diagnosis present

## 2021-06-03 DIAGNOSIS — Z813 Family history of other psychoactive substance abuse and dependence: Secondary | ICD-10-CM | POA: Diagnosis not present

## 2021-06-03 DIAGNOSIS — Y92008 Other place in unspecified non-institutional (private) residence as the place of occurrence of the external cause: Secondary | ICD-10-CM

## 2021-06-03 DIAGNOSIS — J029 Acute pharyngitis, unspecified: Secondary | ICD-10-CM | POA: Diagnosis present

## 2021-06-03 LAB — LIPID PANEL
Cholesterol: 207 mg/dL — ABNORMAL HIGH (ref 0–200)
HDL: 88 mg/dL (ref >40)
LDL Cholesterol: 112 mg/dL — ABNORMAL HIGH (ref 0–99)
Total CHOL/HDL Ratio: 2.4 RATIO
Triglycerides: 33 mg/dL (ref <150)
VLDL: 7 mg/dL (ref 0–40)

## 2021-06-03 LAB — HEMOGLOBIN A1C
Hgb A1c MFr Bld: 5.5 % (ref 4.8–5.6)
Mean Plasma Glucose: 111.15 mg/dL

## 2021-06-03 LAB — TSH: TSH: 2.289 u[IU]/mL (ref 0.350–4.500)

## 2021-06-03 MED ORDER — TIZANIDINE HCL 2 MG PO TABS
2.0000 mg | ORAL_TABLET | Freq: Three times a day (TID) | ORAL | Status: DC | PRN
  Administered 2021-06-03 – 2021-06-05 (×4): 2 mg via ORAL
  Filled 2021-06-03 (×4): qty 1

## 2021-06-03 MED ORDER — IBUPROFEN 200 MG PO TABS
200.0000 mg | ORAL_TABLET | Freq: Four times a day (QID) | ORAL | Status: DC | PRN
  Administered 2021-06-04 – 2021-06-05 (×3): 200 mg via ORAL
  Filled 2021-06-03 (×3): qty 1

## 2021-06-03 MED ORDER — DOCUSATE SODIUM 100 MG PO CAPS
100.0000 mg | ORAL_CAPSULE | Freq: Every day | ORAL | Status: DC
  Administered 2021-06-03 – 2021-06-06 (×4): 100 mg via ORAL
  Filled 2021-06-03 (×7): qty 1

## 2021-06-03 MED ORDER — SUMATRIPTAN SUCCINATE 50 MG PO TABS: 100.0000 mg | ORAL_TABLET | ORAL | Status: DC | PRN

## 2021-06-03 MED ORDER — ALUM & MAG HYDROXIDE-SIMETH 200-200-20 MG/5ML PO SUSP: 30.0000 mL | ORAL | Status: DC | PRN

## 2021-06-03 MED ORDER — HYDROXYZINE HCL 25 MG PO TABS: 25.0000 mg | ORAL_TABLET | Freq: Three times a day (TID) | ORAL | Status: DC | PRN

## 2021-06-03 MED ORDER — ESCITALOPRAM OXALATE 20 MG PO TABS
20.0000 mg | ORAL_TABLET | Freq: Every day | ORAL | Status: DC
  Administered 2021-06-03 – 2021-06-05 (×3): 20 mg via ORAL
  Filled 2021-06-03 (×5): qty 1

## 2021-06-03 MED ORDER — TRAZODONE HCL 100 MG PO TABS
100.0000 mg | ORAL_TABLET | Freq: Every day | ORAL | Status: DC
  Administered 2021-06-03 – 2021-06-05 (×3): 100 mg via ORAL
  Filled 2021-06-03 (×5): qty 1

## 2021-06-03 MED ORDER — ADULT MULTIVITAMIN W/MINERALS CH
1.0000 | ORAL_TABLET | Freq: Every day | ORAL | Status: DC
  Administered 2021-06-03 – 2021-06-06 (×4): 1 via ORAL
  Filled 2021-06-03 (×7): qty 1

## 2021-06-03 MED ORDER — BISACODYL 5 MG PO TBEC
10.0000 mg | DELAYED_RELEASE_TABLET | Freq: Once | ORAL | Status: AC
  Administered 2021-06-03: 18:00:00 10 mg via ORAL
  Filled 2021-06-03 (×2): qty 2

## 2021-06-03 MED ORDER — QUETIAPINE FUMARATE 25 MG PO TABS
25.0000 mg | ORAL_TABLET | Freq: Three times a day (TID) | ORAL | Status: DC | PRN
  Administered 2021-06-03 – 2021-06-06 (×6): 25 mg via ORAL
  Filled 2021-06-03 (×6): qty 1

## 2021-06-03 MED ORDER — ESCITALOPRAM OXALATE 10 MG PO TABS
10.0000 mg | ORAL_TABLET | Freq: Every day | ORAL | Status: DC
  Filled 2021-06-03 (×2): qty 1

## 2021-06-03 MED ORDER — MAGNESIUM HYDROXIDE 400 MG/5ML PO SUSP
30.0000 mL | Freq: Every day | ORAL | Status: DC | PRN
  Administered 2021-06-03 – 2021-06-05 (×3): 30 mL via ORAL
  Filled 2021-06-03 (×3): qty 30

## 2021-06-03 MED ORDER — ACETAMINOPHEN 325 MG PO TABS
650.0000 mg | ORAL_TABLET | Freq: Four times a day (QID) | ORAL | Status: DC | PRN
  Administered 2021-06-03 – 2021-06-04 (×2): 650 mg via ORAL
  Filled 2021-06-03 (×2): qty 2

## 2021-06-03 MED ORDER — VITAMIN D3 1.25 MG (50000 UT) PO CAPS
50000.0000 [IU] | ORAL_CAPSULE | ORAL | Status: DC
  Filled 2021-06-03: qty 1

## 2021-06-03 MED ORDER — MENTHOL 3 MG MT LOZG
1.0000 | LOZENGE | OROMUCOSAL | Status: DC | PRN
  Filled 2021-06-03: qty 9

## 2021-06-03 MED ORDER — PANTOPRAZOLE SODIUM 40 MG PO TBEC
80.0000 mg | DELAYED_RELEASE_TABLET | Freq: Every day | ORAL | Status: DC
  Administered 2021-06-03 – 2021-06-06 (×4): 80 mg via ORAL
  Filled 2021-06-03 (×6): qty 2

## 2021-06-03 MED ORDER — FLUTICASONE PROPIONATE 50 MCG/ACT NA SUSP: 2.0000 | Freq: Every day | NASAL | Status: DC | PRN

## 2021-06-03 NOTE — [facility] Group Notes (Signed)
The focus of this group is to help patients establish daily goals to achieve during treatment and discuss how the patient can incorporate goal setting into their daily lives to aide in recovery.  Pt did not attend 

## 2021-06-03 NOTE — Group Note (Signed)
Recreation Therapy Group Note   Group Topic:Stress Management  Group Date: 06/03/2021 Start Time: 0930 End Time: 0945 Facilitators: Victorino Sparrow, LRT,CTRS Location: [address] Dayroom   Goal Area(s) Addresses:  Patient will actively participate in stress management techniques presented during session.  Patient will successfully identify benefit of practicing stress management post d/c.    Group Description: Guided Imagery. LRT provided education, instruction, and demonstration on practice of visualization via guided imagery. Patient was asked to participate in the technique introduced during session. LRT debriefed including topics of mindfulness, stress management and specific scenarios each patient could use these techniques. Patients were given suggestions of ways to access scripts post d/c and encouraged to explore Youtube and other apps available on smartphones, tablets, and computers.   Affect/Mood: Appropriate   Participation Level: Engaged   Participation Quality: Independent   Behavior: Appropriate   Speech/Thought Process: Focused   Insight: Good   Judgement: Good   Modes of Intervention: Script, [place]   Patient Response to Interventions:  Engaged   Education Outcome:  Acknowledges education and In group clarification offered    Clinical Observations/Individualized Feedback: Pt attended and participated in group.    Plan: Continue to engage patient in RT group sessions 2-3x/week.   Victorino Sparrow, LRT,CTRS 06/03/2021 12:07 PM

## 2021-06-03 NOTE — Progress Notes (Signed)
DAR NOTE: Patient presents with anxious affect and depressed mood.  Denies suicidal thoughts, auditory and visual hallucinations.  Described energy level as low with poor concentration.  Reports withdrawal symptoms of agitation, irritability and nausea on self inventory form.  Rates depression at 7, hopelessness at 7, and anxiety at 10.  Maintained on routine safety checks.  Medications given as prescribed.  Support and encouragement offered as needed.  Attended group and participated.  States goal for today is "anxiety/forgiving myself/not  feeling shame."  Patient observed socializing with peers in the dayroom.  Patient is safe on and off the unit.

## 2021-06-03 NOTE — BH IP Treatment Plan (Signed)
Interdisciplinary Treatment and Diagnostic Plan Update  06/03/2021 Time of Session: 0900  TRANNIE BARDALES MRN: [id number]  Principal Diagnosis: MDD (major depressive disorder), recurrent severe, without psychosis (HCC)  Secondary Diagnoses: Principal Problem:   MDD (major depressive disorder), recurrent severe, without psychosis (HCC)   Current Medications:  Current Facility-Administered Medications  Medication Dose Route Frequency Provider Last Rate Last Admin   acetaminophen (TYLENOL) tablet 650 mg  650 mg Oral Q6H PRN Jaclyn Shaggy, PA-C       alum & mag hydroxide-simeth (MAALOX/MYLANTA) 200-200-20 MG/5ML suspension 30 mL  30 mL Oral Q4H PRN Melbourne Abts W, PA-C       docusate sodium (COLACE) capsule 100 mg  100 mg Oral Daily Park Pope, MD   100 mg at 06/03/21 1249   escitalopram (LEXAPRO) tablet 20 mg  20 mg Oral Seabron Spates, MD       fluticasone [facility]) 50 MCG/ACT nasal spray 2 spray  2 spray Each Nare Daily PRN Melbourne Abts W, PA-C       magnesium hydroxide (MILK OF MAGNESIA) suspension 30 mL  30 mL Oral Daily PRN Melbourne Abts W, PA-C   30 mL at 06/03/21 1135   multivitamin with minerals tablet 1 tablet  1 tablet Oral Daily Melbourne Abts W, PA-C   1 tablet at 06/03/21 0900   pantoprazole (PROTONIX) EC tablet 80 mg  80 mg Oral Q1200 Jaclyn Shaggy, PA-C   80 mg at 06/03/21 1134   QUEtiapine (SEROQUEL) tablet 25 mg  25 mg Oral TID PRN Park Pope, MD   25 mg at 06/03/21 1249   SUMAtriptan (IMITREX) tablet 100 mg  100 mg Oral Q2H PRN Jaclyn Shaggy, PA-C       tiZANidine (ZANAFLEX) tablet 2 mg  2 mg Oral Q8H PRN Park Pope, MD   2 mg at 06/03/21 1249   traZODone (DESYREL) tablet 100 mg  100 mg Oral QHS Jaclyn Shaggy, PA-C       [START ON 06/07/2021] Vitamin D3 CAPS 50,000 Units  50,000 Units Oral Weekly Jaclyn Shaggy, PA-C       PTA Medications: Medications Prior to Admission  Medication Sig Dispense Refill Last Dose   AIMOVIG 70 MG/ML SOAJ INJECT 70 MG INTO THE SKIN EVERY  30 (THIRTY) DAYS. 3 mL 1    Cholecalciferol (VITAMIN D3) 1.25 MG (50000 UT) CAPS TAKE 1 CAPSULE BY MOUTH ONE TIME PER WEEK (Patient taking differently: Take 50,000 Units by mouth once a week. Sunday) 12 capsule 3    clonazePAM (KLONOPIN) 1 MG tablet Take 1 tablet (1 mg total) by mouth 2 (two) times daily. (Patient taking differently: Take 1 mg by mouth daily as needed for anxiety.) 60 tablet 3    clotrimazole-betamethasone (LOTRISONE) cream APPLY TO AFFECTED AREA TWICE A DAY (Patient taking differently: 2 (two) times daily as needed (rash).) 30 g 2    denosumab (PROLIA) 60 MG/ML SOSY injection Inject 60 mg into the skin every 6 (six) months. (Patient not taking: Reported on 06/02/2021) 1 mL 1    DHEA 50 MG TABS Take 50 mg by mouth 2 (two) times daily.      escitalopram (LEXAPRO) 10 MG tablet TAKE 1 TABLET BY MOUTH EVERY DAY (Patient taking differently: Take 10 mg by mouth at bedtime.) 90 tablet 2    esomeprazole (NEXIUM) 40 MG capsule TAKE 1 CAPSULE BY MOUTH EVERY DAY (Patient taking differently: 40 mg daily as needed (for acid reflux).) 90 capsule 3  Estradiol 10 MCG TABS vaginal tablet Place 1 tablet (10 mcg total) vaginally 2 (two) times a week. 8 tablet 10    fluticasone (FLONASE) 50 MCG/ACT nasal spray Place 2 sprays into both nostrils daily. (Patient taking differently: Place 2 sprays into both nostrils daily as needed for allergies.) 16 g 11    hydrocortisone (ANUSOL-HC) 2.5 % rectal cream Place rectally 2 (two) times daily. Use as needed for hemorrhoids. 30 g 1    hydrOXYzine (ATARAX/VISTARIL) 25 MG tablet Take 1 tablet (25 mg total) by mouth at bedtime. (Patient taking differently: Take 25 mg by mouth at bedtime as needed (for bladder (interstitial cysitis)).) 30 tablet 5    hyoscyamine (LEVBID) 0.375 MG 12 hr tablet TAKE 1 TABLET BY MOUTH EVERY 12 HOURS AS NEEDED (Patient taking differently: 0.375 mg daily as needed for cramping.) 180 tablet 0    meloxicam (MOBIC) 7.5 MG tablet TAKE 1 TABLET  BY MOUTH EVERY DAY (Patient taking differently: Take 7.5 mg by mouth daily as needed (for back spasms).) 30 tablet 0    Multiple Vitamin (MULTIVITAMIN WITH MINERALS) TABS tablet Take 1 tablet by mouth daily.      mupirocin ointment (BACTROBAN) 2 % Apply 1 application topically 2 (two) times daily. (Patient taking differently: Apply 1 application topically daily as needed (for rash).) 22 g 0    ondansetron (ZOFRAN ODT) 4 MG disintegrating tablet Take 1 tablet (4 mg total) by mouth every 8 (eight) hours as needed for nausea or vomiting. 20 tablet 0    phenazopyridine (PYRIDIUM) 200 MG tablet Take 1 tablet (200 mg total) by mouth 3 (three) times daily as needed. 6 tablet 0    SUMAtriptan (IMITREX) 100 MG tablet TAKE 1 TABLET BY MOUTH AS NEEDED FOR MIGRAINE HEADACHE, MAY REPEAT ONCE IN TWO HOURS AS NEEDED BUT NO MORE THAN 2 IN 24 HOURS. (Patient taking differently: Take 100 mg by mouth See admin instructions. TAKE 100 MG BY MOUTH AS NEEDED FOR MIGRAINE HEADACHE, MAY REPEAT ONCE IN TWO HOURS AS NEEDED BUT NO MORE THAN 2 IN 24 HOURS.) 9 tablet 4    Testosterone 1.62 % GEL Place onto the skin.      tiZANidine (ZANAFLEX) 2 MG tablet Take 1 tablet (2 mg total) by mouth at bedtime. 10 tablet 0    traZODone (DESYREL) 50 MG tablet Take 2 tablets ($RemoveBe'100mg'kgbojmsQD$ ) daily at bedtime. 60 tablet 1    valACYclovir (VALTREX) 500 MG tablet Take one tablet po bid x 3 days as needed. 30 tablet 3     Patient Stressors: Health problems   Marital or family conflict   Traumatic event    Patient Strengths: Scientist, research (life sciences)  Religious Affiliation  Work skills   Treatment Modalities: Medication Management, Group therapy, Case management,  1 to 1 session with clinician, Psychoeducation, Recreational therapy.   Physician Treatment Plan for Primary Diagnosis: MDD (major depressive disorder), recurrent severe, without psychosis ([place]) Long Term Goal(s):     Short Term Goals:    Medication Management: Evaluate patient's response,  side effects, and tolerance of medication regimen.  Therapeutic Interventions: 1 to 1 sessions, Unit Group sessions and Medication administration.  Evaluation of Outcomes: Not Met  Physician Treatment Plan for Secondary Diagnosis: Principal Problem:   MDD (major depressive disorder), recurrent severe, without psychosis ([place])  Long Term Goal(s):     Short Term Goals:       Medication Management: Evaluate patient's response, side effects, and tolerance of medication regimen.  Therapeutic Interventions: 1 to 1  sessions, Unit Group sessions and Medication administration.  Evaluation of Outcomes: Not Met   RN Treatment Plan for Primary Diagnosis: MDD (major depressive disorder), recurrent severe, without psychosis ([place]) Long Term Goal(s): Knowledge of disease and therapeutic regimen to maintain health will improve  Short Term Goals: Ability to remain free from injury will improve, Ability to verbalize frustration and anger appropriately will improve, Ability to demonstrate self-control, Ability to participate in decision making will improve, Ability to verbalize feelings will improve, Ability to disclose and discuss suicidal ideas, Ability to identify and develop effective coping behaviors will improve, and Compliance with prescribed medications will improve  Medication Management: RN will administer medications as ordered by provider, will assess and evaluate patient's response and provide education to patient for prescribed medication. RN will report any adverse and/or side effects to prescribing provider.  Therapeutic Interventions: 1 on 1 counseling sessions, Psychoeducation, Medication administration, Evaluate responses to treatment, Monitor vital signs and CBGs as ordered, Perform/monitor CIWA, COWS, AIMS and Fall Risk screenings as ordered, Perform wound care treatments as ordered.  Evaluation of Outcomes: Not Met   LCSW Treatment Plan for Primary Diagnosis: MDD (major depressive  disorder), recurrent severe, without psychosis ([place]) Long Term Goal(s): Safe transition to appropriate next level of care at discharge, Engage patient in therapeutic group addressing interpersonal concerns.  Short Term Goals: Engage patient in aftercare planning with referrals and resources, Increase social support, Increase ability to appropriately verbalize feelings, Increase emotional regulation, Facilitate acceptance of mental health diagnosis and concerns, Facilitate patient progression through stages of change regarding substance use diagnoses and concerns, and Identify triggers associated with mental health/substance abuse issues  Therapeutic Interventions: Assess for all discharge needs, 1 to 1 time with Social worker, Explore available resources and support systems, Assess for adequacy in community support network, Educate family and significant other(s) on suicide prevention, Complete Psychosocial Assessment, Interpersonal group therapy.  Evaluation of Outcomes: Not Met   Progress in Treatment: Attending groups: Yes. Participating in groups: Yes. Taking medication as prescribed: Yes. Toleration medication: Yes. Family/Significant other contact made: No, will contact:  CSW will reach out to collateral once consent has been obtained.  Patient understands diagnosis: Yes. Discussing patient identified problems/goals with staff: Yes. Medical problems stabilized or resolved: Yes. Denies suicidal/homicidal ideation: No. Issues/concerns per patient self-inventory: Yes. Other: none   New problem(s) identified: No, Describe:  No additional problems identified at this time.   New Short Term/Long Term Goal(s): Patient to work towards medication management for mood stabilization; elimination of SI thoughts; development of comprehensive mental wellness plan.  Patient Goals: Patient did not attend treatment team meeting.     Discharge Plan or Barriers: No barriers identified at this time.  Patient to return to place of residence once appropriate for discharge.   Reason for Continuation of Hospitalization: Depression Suicidal ideation  Estimated Length of Stay: TBD    Scribe for Treatment Team: Larose Kells 06/03/2021 2:26 PM
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
## 2021-06-03 NOTE — Progress Notes (Signed)
Gabriela Shepard is a 60 y.o. female involuntarily admitted for suicide ideation with a plan to to suffocate from carbon monoxide from the car. Pt stated she locked herself in her garage, turn her car on and let the engine run producing carbon monoxide. 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 She has two grown children who she is not getting along with. Pt denied use of drugs and alcohol, denied SI/HI, AVH at the time of admission and contracted for safety. Consents signed, skin/belongings search completed and pt oriented to unit. Pt stable at this time. Pt given the opportunity to express concerns and ask questions. Pt given toiletries. Will continue to monitor.

## 2021-06-03 NOTE — Progress Notes (Signed)
The patient rated her day as an 8.5 out of 10 due to her anxiety. She also had a good talk with her doctor as well. Her goal for tomorrow is to gain more coping skills.
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
## 2021-06-03 NOTE — Progress Notes (Signed)
°   06/03/21 2141  Psych Admission Type (Psych Patients Only)  Admission Status Involuntary  Psychosocial Assessment  Patient Complaints Anxiety;Worrying  Eye Contact Fair  Facial Expression Anxious;Sad  Affect Anxious  Speech Logical/coherent  Interaction Assertive  Motor Activity Slow  Appearance/Hygiene In hospital gown  Behavior Characteristics Cooperative;Appropriate to situation  Mood Anxious;Depressed;Sad  Thought Process  Coherency WDL  Content Blaming self;Blaming others  Delusions None reported or observed  Perception WDL  Hallucination None reported or observed  Judgment Poor  Confusion None  Danger to Self  Current suicidal ideation? Denies  Danger to Others  Danger to Others None reported or observed

## 2021-06-03 NOTE — Group Note (Signed)
LCSW Group Therapy Note   Group Date: 06/03/2021 Start Time: 1300 End Time: 1400   Type of Group and Topic: Psychoeducational Group: Discharge Planning  Participation Level: Active  Description of Group  Discharge planning group reviews patient's anticipated discharge plans and assists patients to anticipate and address any barriers to wellness/recovery in the community. Suicide prevention education is reviewed with patients in group.  Therapeutic Goals  1. Patients will state their anticipated discharge plan and mental health aftercare  2. Patients will identify potential barriers to wellness in the community setting  3. Patients will engage in problem solving, solution focused discussion of ways to anticipate and address barriers to wellness/recovery  Summary of Patient Progress: The Pt attended group and remained there the entire time.  The Pt accepted the 5 W's worksheet and participated in filling it out.  The Pt participated in the group discussion and was appropriate with their peers.   Darleen Crocker, LCSWA 06/03/2021  2:09 PM

## 2021-06-03 NOTE — Tx Team (Signed)
Initial Treatment Plan 06/03/2021 2:47 AM [name] [id number]    PATIENT STRESSORS: Health problems   Marital or family conflict   Traumatic event     PATIENT STRENGTHS: Financial means  Religious Affiliation  Work skills    PATIENT IDENTIFIED PROBLEMS: Depression  Anxiety  " Understand" how to relax myself"  " What to do to take break in life without loosing my job"               DISCHARGE CRITERIA:  Ability to meet basic life and health needs Improved stabilization in mood, thinking, and/or behavior Motivation to continue treatment in a less acute level of care  PRELIMINARY DISCHARGE PLAN: Attend PHP/IOP Outpatient therapy Return to previous living arrangement Return to previous work or school arrangements  PATIENT/FAMILY INVOLVEMENT: This treatment plan has been presented to and reviewed with the patient, Gabriela Shepard, and/or family member, .  The patient and family have been given the opportunity to ask questions and make suggestions.  Wolfgang Phoenix, RN 06/03/2021, 2:47 AM

## 2021-06-04 MED ORDER — POLYETHYLENE GLYCOL 3350 17 G PO PACK
17.0000 g | PACK | Freq: Every day | ORAL | Status: DC
  Administered 2021-06-04 – 2021-06-06 (×3): 17 g via ORAL
  Filled 2021-06-04 (×6): qty 1

## 2021-06-04 NOTE — Progress Notes (Signed)
[facility] MD Progress Note  06/04/2021 11:59 AM Gabriela Shepard  MRN:  [id number] Reason for Admission:  Patient is a 60 year old female with history of MDD and GAD presenting from [place] [facility] ED involuntarily due to suicide attempt via carbon monoxide poisoning (locking self in garage with car on). Principal Problem: MDD (major depressive disorder), recurrent severe, without psychosis ([place]) Diagnosis: Principal Problem:   MDD (major depressive disorder), recurrent severe, without psychosis ([place]) Active Problems:   PTSD (post-traumatic stress disorder)   Total Time spent with patient: 45 minutes  SUBJECTIVE Patient was seen and assessed with attending Dr. Berdine Addison.  Patient reports to having poor sleep due to having tea too late last night. Patient reports that she normally is able to sleep with the scheduled trazodone. Patient denies SI/HI/AVH, paranoia, ideas of reference.  Reports continued constipation and abdominal pain.  Patient reports also back pain due to her osteoarthritis and states that she has been having difficulty resting because of back pain.  Patient reports that the hospital bed has been slightly helpful but ultimately is not beneficial to her.  Patient requests whether she would be able to receive a yoga mat or a foam pad for her to exercise with.  Patient reports eating well and feeling less depressed.  Patient reports that she has some anxiety and guilt with regards to her suicide attempt but has reported she has gained great insight and coping skills to better process her emotions.  Discussed plan to add MiraLAX scheduled for constipation.  No changes to psychotropic medications will be made today.  We will continue to monitor patient for any residual depression or suicidal ideations.  Patient verbalized agreement and understanding.  Patient reports that she also would like to be discharged by Saturday as she is concerned that she will have a large financial burden going into  2023.  Past Psychiatric History: see H&P  Past Medical History:  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   History of UTI    Interstitial cystitis    Low sodium levels    Migraines    Osteoporosis    PONV (postoperative nausea and vomiting)    PTSD (post-traumatic stress disorder)    on meds   STD (sexually transmitted disease)    Substance abuse ([place])    hx of alcohol   Urine incontinence     Past Surgical History:  Procedure Laterality Date   BREAST BIOPSY Left    2017 benign x 2   CESAREAN SECTION  1992   COLONOSCOPY  2018   JMP-MAC-suprep(good)-polyps   INCONTINENCE SURGERY     LAPAROSCOPY     under bladder   NECK SURGERY  02/24/2018   ACDF   POLYPECTOMY  2018   polyps removed   TOTAL HIP ARTHROPLASTY Right 09/08/2020   TOTAL HIP ARTHROPLASTY Left 2018   TOTAL KNEE ARTHROPLASTY Left 12/23/2015   Procedure: TOTAL KNEE ARTHROPLASTY;  Surgeon: Melrose Nakayama, MD;  Location: [facility];  Service: Orthopedics;  Laterality: Left;   Family History:  Family History  Problem Relation Age  of Onset   Hypertension Mother    Lung cancer Mother    Kidney cancer Father    Ulcers Father    Heart disease Father    Drug abuse Sister    Alcoholism Brother    Hypertension Maternal Grandmother    Alcohol abuse Other    Arthritis Other    Hypertension Other    Mental illness Other    Healthy Daughter    Healthy Son    Colon cancer Neg Hx    Esophageal cancer Neg Hx    Colon polyps Neg Hx    Rectal cancer Neg Hx    Stomach cancer Neg Hx    Family Psychiatric  History: see H&P Social History:  Social History   Substance and Sexual Activity  Alcohol Use  No   Comment: hx of ETOH abuse-sober since 11/2010      Social History   Substance and Sexual Activity  Drug Use No    Social History   Socioeconomic History   Marital status: Divorced    Spouse name: Not on file   Number of children: Not on file   Years of education: Not on file   Highest education level: Bachelor's degree (e.g., BA, AB, BS)  Occupational History   Not on file  Tobacco Use   Smoking status: Former    Packs/day: 0.50    Years: 10.00    Pack years: 5.00    Types: Cigarettes    Quit date: 11/13/2010    Years since quitting: 10.5   Smokeless tobacco: Never  Vaping Use   Vaping Use: Never used  Substance and Sexual Activity   Alcohol use: No    Comment: hx of ETOH abuse-sober since 11/2010    Drug use: No   Sexual activity: Not Currently    Birth control/protection: Post-menopausal  Other Topics Concern   Not on file  Social History Narrative   Not on file   Social Determinants of Health   Financial Resource Strain: Low Risk    Difficulty of Paying Living Expenses: Not hard at all  Food Insecurity: No Food Insecurity   Worried About Charity fundraiser in the Last Year: Never true   [place] in the Last Year: Never true  Transportation Needs: No Transportation Needs   Lack of Transportation (Medical): No   Lack of Transportation (Non-Medical): No  Physical Activity: Insufficiently Active   Days of Exercise per Week: 4 days   Minutes of Exercise per Session: 30 min  Stress: Stress Concern Present   Feeling of Stress : To some extent  Social Connections: Moderately Integrated   Frequency of Communication with Friends and Family: More than three times a week   Frequency of Social Gatherings with Friends and Family: Once a week   Attends Religious Services: 1 to 4 times per year   Active Member of [employer] or Organizations: Yes   Attends Archivist Meetings: 1 to 4 times per year   Marital Status: Divorced   Additional Social  History:                         Sleep: Poor  Appetite:  Fair  Current Medications: Current Facility-Administered Medications  Medication Dose Route Frequency Provider Last Rate Last Admin   acetaminophen (TYLENOL) tablet 650 mg  650 mg Oral Q6H PRN Prescilla Sours, PA-C   650 mg at 06/03/21 1520   alum & mag hydroxide-simeth (  MAALOX/MYLANTA) 200-200-20 MG/5ML suspension 30 mL  30 mL Oral Q4H PRN Margorie John W, PA-C       docusate sodium (COLACE) capsule 100 mg  100 mg Oral Daily France Ravens, MD   100 mg at 06/04/21 1117   escitalopram (LEXAPRO) tablet 20 mg  20 mg Oral Aliene Altes, MD   20 mg at 06/03/21 2140   fluticasone (FLONASE) 50 MCG/ACT nasal spray 2 spray  2 spray Each Nare Daily PRN Prescilla Sours, PA-C       ibuprofen (ADVIL) tablet 200 mg  200 mg Oral Q6H PRN France Ravens, MD       magnesium hydroxide (MILK OF MAGNESIA) suspension 30 mL  30 mL Oral Daily PRN Margorie John W, PA-C   30 mL at 06/03/21 1135   menthol-cetylpyridinium (CEPACOL) lozenge 3 mg  1 lozenge Oral PRN France Ravens, MD       multivitamin with minerals tablet 1 tablet  1 tablet Oral Daily Prescilla Sours, PA-C   1 tablet at 06/04/21 1117   pantoprazole (PROTONIX) EC tablet 80 mg  80 mg Oral Q1200 Prescilla Sours, PA-C   80 mg at 06/04/21 1117   polyethylene glycol (MIRALAX / GLYCOLAX) packet 17 g  17 g Oral Daily France Ravens, MD   17 g at 06/04/21 1116   QUEtiapine (SEROQUEL) tablet 25 mg  25 mg Oral TID PRN France Ravens, MD   25 mg at 06/03/21 2141   SUMAtriptan (IMITREX) tablet 100 mg  100 mg Oral Q2H PRN Prescilla Sours, PA-C       tiZANidine (ZANAFLEX) tablet 2 mg  2 mg Oral Q8H PRN France Ravens, MD   2 mg at 06/03/21 2141   traZODone (DESYREL) tablet 100 mg  100 mg Oral QHS Margorie John W, PA-C   100 mg at 06/03/21 2141   [START ON 06/07/2021] Vitamin D3 CAPS 50,000 Units  50,000 Units Oral Weekly Prescilla Sours, PA-C        Lab Results:  Results for orders placed or performed during the hospital  encounter of 06/03/21 (from the past 48 hour(s))  Lipid panel     Status: Abnormal   Collection Time: 06/03/21  6:27 AM  Result Value Ref Range   Cholesterol 207 (H) 0 - 200 mg/dL   Triglycerides 33 <150 mg/dL   HDL 88 >40 mg/dL   Total CHOL/HDL Ratio 2.4 RATIO   VLDL 7 0 - 40 mg/dL   LDL Cholesterol 112 (H) 0 - 99 mg/dL    Comment:        Total Cholesterol/HDL:CHD Risk Coronary Heart Disease Risk Table                     Men   Women  1/2 Average Risk   3.4   3.3  Average Risk       5.0   4.4  2 X Average Risk   9.6   7.1  3 X Average Risk  23.4   11.0        Use the calculated Patient Ratio above and the CHD Risk Table to determine the patient's CHD Risk.        ATP III CLASSIFICATION (LDL):  <100     mg/dL   Optimal  100-129  mg/dL   Near or Above                    Optimal  130-159  mg/dL  Borderline  160-189  mg/dL   High  >190     mg/dL   Very High Performed at [facility] [address] [place], [place] [address]   Hemoglobin A1c     Status: None   Collection Time: 06/03/21  6:27 AM  Result Value Ref Range   Hgb A1c MFr Bld 5.5 4.8 - 5.6 %    Comment: (NOTE) Pre diabetes:          5.7%-6.4%  Diabetes:              >6.4%  Glycemic control for   <7.0% adults with diabetes    Mean Plasma Glucose 111.15 mg/dL    Comment: Performed at [facility] [address], [place], [place] [address]  TSH     Status: None   Collection Time: 06/03/21  6:27 AM  Result Value Ref Range   TSH 2.289 0.350 - 4.500 uIU/mL    Comment: Performed by a 3rd Generation assay with a functional sensitivity of <=0.01 uIU/mL. Performed at [facility], [place] [address] [place],  [address]     Blood Alcohol level:  Lab Results  Component Value Date   ETH <10 27/12/8673    Metabolic Disorder Labs: Lab Results  Component Value Date   HGBA1C 5.5 06/03/2021   MPG 111.15 06/03/2021   No results found for: PROLACTIN Lab Results   Component Value Date   CHOL 207 (H) 06/03/2021   TRIG 33 06/03/2021   HDL 88 06/03/2021   CHOLHDL 2.4 06/03/2021   VLDL 7 06/03/2021   LDLCALC 112 (H) 06/03/2021   LDLCALC 96 10/30/2020    Physical Findings:  Musculoskeletal: Strength & Muscle Tone: within normal limits Gait & Station: normal Patient leans: N/A  Psychiatric Specialty Exam:  Presentation  General Appearance: Appropriate for Environment  Eye Contact:Good  Speech:Clear and Coherent  Speech Volume:Normal  Handedness:No data recorded  Mood and Affect  Mood: less depressed Affect: congruent, less tearful  Thought Process  Thought Processes:Coherent; Linear  Descriptions of Associations:Intact  Orientation:Full (Time, Place and Person)  Thought Content:Logical  History of Schizophrenia/Schizoaffective disorder:No  Duration of Psychotic Symptoms:N/A  Hallucinations:Hallucinations: None  Ideas of Reference:None  Suicidal Thoughts:Suicidal Thoughts: No  Homicidal Thoughts:Homicidal Thoughts: No   Sensorium  Memory:Immediate Good; Recent Good; Remote Good  Judgment:Poor  Insight:Fair   Executive Functions  Concentration:Fair  Attention Span:Fair  [place]   Psychomotor Activity  Psychomotor Activity:Psychomotor Activity: Normal   Assets  Assets:Communication Skills; Desire for Improvement; Financial Resources/Insurance; Housing   Sleep  Sleep:Sleep: Fair    Physical Exam: Physical Exam Vitals and nursing note reviewed.  Constitutional:      Appearance: Normal appearance. She is normal weight.  HENT:     Head: Normocephalic and atraumatic.  Pulmonary:     Effort: Pulmonary effort is normal.  Neurological:     General: No focal deficit present.     Mental Status: She is oriented to person, place, and time.   Review of Systems  Respiratory:  Negative for shortness of breath.   Cardiovascular:  Negative for chest pain.   Gastrointestinal:  Positive for constipation. Negative for abdominal pain, diarrhea, heartburn, nausea and vomiting.  Neurological:  Negative for headaches.  Blood pressure 122/83, pulse 74, temperature 97.9 F (36.6 C), temperature source Oral, resp. rate 20, height 5' 4.4" (1.636 m), weight 64 kg, last menstrual period 06/07/2010, SpO2 100 %. Body mass index is  23.94 kg/m.   Treatment Plan Summary: Daily contact with patient to assess and evaluate symptoms and progress in treatment and Medication management  ASSESSMENT Patient is a 60 year old female with history of MDD and GAD presenting from [place] [facility] ED involuntarily due to suicide attempt via carbon monoxide poisoning (locking self in garage with car on).   PLAN Safety and Monitoring: Involuntary admission to inpatient psychiatric unit for safety, stabilization and treatment Daily contact with patient to assess and evaluate symptoms and progress in treatment Patient's case to be discussed in multi-disciplinary team meeting Observation Level : q15 minute checks Vital signs: q12 hours Precautions: suicide, elopement, and assault     Psychiatric Problems MDD-recurrent episode, severe w/o psychotic features GAD w/ panic attacks Hx of alcohol abuse -Continue Lexapro 20 mg qd for depressive symptoms -Continue Trazodone 100 mg qhs for sleep -Continue Seroquel 25 mg tid prn for anxiety   Medical Problems Osteoarthritis -Ibuprofen 200 mg for moderate pain -Tylenol 650 mg q6 hour -Zanaflex 2 mg for muscle spasms   Migraines -Imitrex 100 mg q2hr prn   Sore Throat -Cepacol 3 mg prn   Constipation -Colace 100 mg qd -Miralax 17 g qd -Milk of Magnesia   PRNs Tylenol 650 mg for mild pain Maalox/Mylanta 30 mL for indigestion Hydroxyzine 25 mg tid for anxiety Milk of Magnesia 30 mL for constipation Trazodone 50 mg for sleep   4. Discharge Planning: Social work and case management to assist with discharge planning and  identification of hospital follow-up needs prior to discharge Estimated LOS: 5-7 days Discharge Concerns: Need to establish a safety plan; Medication compliance and effectiveness Discharge Goals: Return home with outpatient referrals for mental health follow-up including medication management/psychotherapy France Ravens, MD 06/04/2021, 11:59 AM

## 2021-06-04 NOTE — [facility] Suicide Risk Assessment (Signed)
[facility] INPATIENT:  Family/Significant Other Suicide Prevention Education  Suicide Prevention Education:  Education Completed; Lanier Clam [phone] (Daughter) has been identified by the patient as the family member/significant other with whom the patient will be residing, and identified as the person(s) who will aid the patient in the event of a mental health crisis (suicidal ideations/suicide attempt).  With written consent from the patient, the family member/significant other has been provided the following suicide prevention education, prior to the and/or following the discharge of the patient.  The suicide prevention education provided includes the following: Suicide risk factors Suicide prevention and interventions National Suicide Hotline telephone number [facility] assessment telephone number [facility] Emergency Assistance [place] and/or Residential Mobile Crisis Unit telephone number  Request made of family/significant other to: Remove weapons (e.g., guns, rifles, knives), all items previously/currently identified as safety concern.   Remove drugs/medications (over-the-counter, prescriptions, illicit drugs), all items previously/currently identified as a safety concern.  The family member/significant other verbalizes understanding of the suicide prevention education information provided.  The family member/significant other agrees to remove the items of safety concern listed above.  CSW spoke with Mrs. Esmond Camper who states that her mother has been depressed for many years and that she attempted suicide prior to coming to the hospital.  She states that her mother has no firearms or weapons in her home.  She states that she is concerned about her mother living alone after a suicide attempt.  CSW allowed Mrs. Esmond Camper to ask questions and state her concerns about her mother's living situation.  CSW explained ways for Mrs. Esmond Camper to be supportive for her mother  during this time and provided information for places where she could take her mother for additional assistance after discharge.  CSW completed SPE with Mrs. Esmond Camper.   Frutoso Chase Kynzi Levay 06/04/2021, 2:01 PM

## 2021-06-04 NOTE — Progress Notes (Signed)
°   06/04/21 2146  Psych Admission Type (Psych Patients Only)  Admission Status Involuntary  Psychosocial Assessment  Patient Complaints Anxiety;Worrying  Eye Contact Fair  Facial Expression Anxious;Pensive;Sad  Affect Appropriate to circumstance;Anxious  Speech Logical/coherent  Interaction Assertive  Motor Activity Slow  Appearance/Hygiene In hospital gown  Behavior Characteristics Cooperative;Appropriate to situation  Mood Anxious;Depressed  Thought Process  Coherency WDL  Content Blaming self  Delusions None reported or observed  Perception WDL  Hallucination None reported or observed  Judgment Poor  Confusion None  Danger to Self  Current suicidal ideation? Denies  Danger to Others  Danger to Others None reported or observed

## 2021-06-04 NOTE — Progress Notes (Signed)
[facility] Group Notes:  (Nursing/MHT/Case Management/Adjunct)  Date:  06/04/2021  Time: 2015 Type of Therapy:   wrap up group  Participation Level:  Active  Participation Quality:  Appropriate, Attentive, Sharing, and Supportive  Affect:  Appropriate  Cognitive:  Alert  Insight:  Improving  Engagement in Group:  Engaged  Modes of Intervention:  Clarification, Education, and Support  Summary of Progress/Problems: Positive thinking and positive change were discussed.   Shellia Cleverly 06/04/2021, 9:01 PM

## 2021-06-04 NOTE — Group Note (Signed)
Date:  06/04/2021 Time:  1:52 PM  Group Topic/Focus:  Goals Group:   The focus of this group is to help patients establish daily goals to achieve during treatment and discuss how the patient can incorporate goal setting into their daily lives to aide in recovery.    Participation Level:  Active  Participation Quality:  Appropriate  Affect:  Appropriate  Cognitive:  Appropriate  Insight: Appropriate  Engagement in Group:  Engaged  Modes of Intervention:  Discussion  Additional Comments: Pt wants to build positive relationships and learn how to cope with problems.   Garvin Fila 06/04/2021, 1:52 PM
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
## 2021-06-04 NOTE — Progress Notes (Signed)
Patient denies SI, HI and AVH. Patient reported anxiety stating "I didn't know what anxiety was until about a year ago and now I realize I have been in a constant state of anxiety. Patient had several somatic complaints to include back ache and constipation.  Patient was offered medication for both and reported some relief from the back pain.  Patient has not produced a bowel movement and complains of feeling uncomfortable.   Assess patient for safety, offer medication as planned, engage patient in 1:1 staff talks.   Patient is able to contract for safety. Continue to monitor as planned.

## 2021-06-05 DIAGNOSIS — R7989 Other specified abnormal findings of blood chemistry: Secondary | ICD-10-CM | POA: Diagnosis present

## 2021-06-05 DIAGNOSIS — F332 Major depressive disorder, recurrent severe without psychotic features: Secondary | ICD-10-CM

## 2021-06-05 MED ORDER — POTASSIUM CHLORIDE CRYS ER 20 MEQ PO TBCR
40.0000 meq | EXTENDED_RELEASE_TABLET | Freq: Once | ORAL | Status: DC
  Filled 2021-06-05: qty 2

## 2021-06-05 MED ORDER — MELATONIN 5 MG PO TABS
5.0000 mg | ORAL_TABLET | Freq: Every day | ORAL | Status: DC
  Administered 2021-06-05: 21:00:00 5 mg via ORAL
  Filled 2021-06-05 (×3): qty 1

## 2021-06-05 NOTE — BHH Counselor (Addendum)
CSW provided the Pt with information for [facility] [place] so she can find services to help her with the care of her 60 year old mother.  The Pt asked the CSW for information for services that could her her in this area because care for her mother has been a stressful factor in her life and having additional services would relieve some of that stress.

## 2021-06-05 NOTE — Progress Notes (Signed)
[facility] MD Progress Note  06/05/2021 2:56 PM TIERSA DAYLEY  MRN:  [id number]  Reason for Admission:  Patient is a 60 year old female with history of MDD and GAD presenting from [place] long ED involuntarily due to suicide attempt via carbon monoxide poisoning (locking self in garage with car on).  Principal Problem: MDD (major depressive disorder), recurrent severe, without psychosis ([place]) Diagnosis: Principal Problem:   MDD (major depressive disorder), recurrent severe, without psychosis ([place]) Active Problems:   PTSD (post-traumatic stress disorder)   Low serum sodium  Total Time spent with patient: 25 minutes  Today's Evaluation Chart reviewed, and patient's case discussed with treatment team. Pt calm and cooperative during this encounter. Eye contact is good, attention to personal hygiene and grooming is good, mood is euthymic and affect is congruent. Thought content is logical. Pt denies SI/HI/AVH, denies paranoia, denies any delusional thoughts, and there is no evidence of such thoughts. Pt reports a good appetite, reports a poor night's sleep last night, and denies any medication related side effects. Pt is observed to be interacting with her peers on the unit. Pt is focused on being discharged and states that it is important for her to be discharged tomorrow so that she does not incur a bill of $10,000 from her insurance company if she stays past tomorrow. Pt was educated that this decision is made by the treatment team on a daily basis, and that it will be considered during the morning treatment team.  Pt's HR today is low in the 50s, pt denies any lightheadedness or dizziness, and fluids are being encouraged. Will add Melatonin 5 mg nightly for insomnia.  Past Psychiatric History: see H&P  Past Medical History:  Past Medical History:  Diagnosis Date   Allergy    seasonal allergies   Arthritis    generalized   Bowel habit changes    been going on couple years   Depression     on meds   Flatulence    excessive with strong odor/uncontrollable   GERD (gastroesophageal reflux disease)    PRN meds   Headache    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   History of UTI    Interstitial cystitis    Low sodium levels    Migraines    Osteoporosis    PONV (postoperative nausea and vomiting)    PTSD (post-traumatic stress disorder)    on meds   STD (sexually transmitted disease)    Substance abuse ([place])    hx of alcohol   Urine incontinence     Past Surgical History:  Procedure Laterality Date   BREAST BIOPSY Left    2017 benign x 2   CESAREAN SECTION  1992   COLONOSCOPY  2018   JMP-MAC-suprep(good)-polyps   INCONTINENCE SURGERY     LAPAROSCOPY     under bladder   NECK SURGERY  02/24/2018   ACDF   POLYPECTOMY  2018   polyps removed   TOTAL HIP ARTHROPLASTY Right 09/08/2020   TOTAL HIP ARTHROPLASTY Left 2018   TOTAL KNEE ARTHROPLASTY Left 12/23/2015   Procedure: TOTAL KNEE ARTHROPLASTY;  Surgeon: Melrose Nakayama, MD;  Location: [facility];  Service: Orthopedics;  Laterality: Left;   Family History:  Family History  Problem Relation Age of Onset   Hypertension Mother    Lung cancer Mother    Kidney cancer Father    Ulcers Father    Heart disease Father    Drug abuse Sister  Alcoholism Brother    Hypertension Maternal Grandmother    Alcohol abuse Other    Arthritis Other    Hypertension Other    Mental illness Other    Healthy Daughter    Healthy Son    Colon cancer Neg Hx    Esophageal cancer Neg Hx    Colon polyps Neg Hx    Rectal cancer Neg Hx    Stomach cancer Neg Hx    Family Psychiatric  History: see H&P Social History:  Social History   Substance and Sexual Activity  Alcohol Use No   Comment: hx of ETOH abuse-sober since 11/2010      Social History   Substance and Sexual Activity  Drug Use No    Social History   Socioeconomic History   Marital status: Divorced    Spouse name: Not on file   Number of  children: Not on file   Years of education: Not on file   Highest education level: Bachelor's degree (e.g., BA, AB, BS)  Occupational History   Not on file  Tobacco Use   Smoking status: Former    Packs/day: 0.50    Years: 10.00    Pack years: 5.00    Types: Cigarettes    Quit date: 11/13/2010    Years since quitting: 10.5   Smokeless tobacco: Never  Vaping Use   Vaping Use: Never used  Substance and Sexual Activity   Alcohol use: No    Comment: hx of ETOH abuse-sober since 11/2010    Drug use: No   Sexual activity: Not Currently    Birth control/protection: Post-menopausal  Other Topics Concern   Not on file  Social History Narrative   Not on file   Social Determinants of Health   Financial Resource Strain: Low Risk    Difficulty of Paying Living Expenses: Not hard at all  Food Insecurity: No Food Insecurity   Worried About Charity fundraiser in the Last Year: Never true   [place] in the Last Year: Never true  Transportation Needs: No Transportation Needs   Lack of Transportation (Medical): No   Lack of Transportation (Non-Medical): No  Physical Activity: Insufficiently Active   Days of Exercise per Week: 4 days   Minutes of Exercise per Session: 30 min  Stress: Stress Concern Present   Feeling of Stress : To some extent  Social Connections: Moderately Integrated   Frequency of Communication with Friends and Family: More than three times a week   Frequency of Social Gatherings with Friends and Family: Once a week   Attends Religious Services: 1 to 4 times per year   Active Member of [employer] or Organizations: Yes   Attends Archivist Meetings: 1 to 4 times per year   Marital Status: Divorced   Sleep: Poor  Appetite:  Fair  Current Medications: Current Facility-Administered Medications  Medication Dose Route Frequency Provider Last Rate Last Admin   acetaminophen (TYLENOL) tablet 650 mg  650 mg Oral Q6H PRN Prescilla Sours, PA-C   650 mg at  06/04/21 1255   alum & mag hydroxide-simeth (MAALOX/MYLANTA) 200-200-20 MG/5ML suspension 30 mL  30 mL Oral Q4H PRN Margorie John W, PA-C       docusate sodium (COLACE) capsule 100 mg  100 mg Oral Daily France Ravens, MD   100 mg at 06/05/21 8280   escitalopram (LEXAPRO) tablet 20 mg  20 mg Oral Aliene Altes, MD   20 mg at 06/04/21 2146  fluticasone (FLONASE) 50 MCG/ACT nasal spray 2 spray  2 spray Each Nare Daily PRN Margorie John W, PA-C       ibuprofen (ADVIL) tablet 200 mg  200 mg Oral Q6H PRN France Ravens, MD   200 mg at 06/05/21 0603   magnesium hydroxide (MILK OF MAGNESIA) suspension 30 mL  30 mL Oral Daily PRN Prescilla Sours, PA-C   30 mL at 06/05/21 1348   melatonin tablet 5 mg  5 mg Oral QHS Kimberely Mccannon, Tamela Oddi, NP       menthol-cetylpyridinium (CEPACOL) lozenge 3 mg  1 lozenge Oral PRN France Ravens, MD       multivitamin with minerals tablet 1 tablet  1 tablet Oral Daily Prescilla Sours, PA-C   1 tablet at 06/05/21 0102   pantoprazole (PROTONIX) EC tablet 80 mg  80 mg Oral Q1200 Prescilla Sours, PA-C   80 mg at 06/05/21 1347   polyethylene glycol (MIRALAX / GLYCOLAX) packet 17 g  17 g Oral Daily France Ravens, MD   17 g at 06/05/21 7253   QUEtiapine (SEROQUEL) tablet 25 mg  25 mg Oral TID PRN France Ravens, MD   25 mg at 06/05/21 6644   SUMAtriptan (IMITREX) tablet 100 mg  100 mg Oral Q2H PRN Prescilla Sours, PA-C       tiZANidine (ZANAFLEX) tablet 2 mg  2 mg Oral Q8H PRN France Ravens, MD   2 mg at 06/05/21 0604   traZODone (DESYREL) tablet 100 mg  100 mg Oral QHS Margorie John W, PA-C   100 mg at 06/04/21 2146   [START ON 06/07/2021] Vitamin D3 CAPS 50,000 Units  50,000 Units Oral Weekly Prescilla Sours, PA-C       Lab Results:  No results found for this or any previous visit (from the past 48 hour(s)).   Blood Alcohol level:  Lab Results  Component Value Date   ETH <10 03/47/4259   Metabolic Disorder Labs: Lab Results  Component Value Date   HGBA1C 5.5 06/03/2021   MPG 111.15 06/03/2021   No  results found for: PROLACTIN Lab Results  Component Value Date   CHOL 207 (H) 06/03/2021   TRIG 33 06/03/2021   HDL 88 06/03/2021   CHOLHDL 2.4 06/03/2021   VLDL 7 06/03/2021   LDLCALC 112 (H) 06/03/2021   LDLCALC 96 10/30/2020   Physical Findings:  Musculoskeletal: Strength & Muscle Tone: within normal limits Gait & Station: normal Patient leans: N/A  Psychiatric Specialty Exam:  Presentation  General Appearance: Appropriate for Environment; Fairly Groomed  Eye Contact:Fair  Speech:Clear and Coherent  Speech Volume:Normal  Handedness:Right  Mood and Affect  Mood: less depressed Affect: congruent, less tearful  Thought Process  Thought Processes:Coherent  Descriptions of Associations:Intact  Orientation:Full (Time, Place and Person)  Thought Content:Logical  History of Schizophrenia/Schizoaffective disorder:No  Duration of Psychotic Symptoms:N/A  Hallucinations:Hallucinations: None   Ideas of Reference:None  Suicidal Thoughts:Suicidal Thoughts: No  Homicidal Thoughts:Homicidal Thoughts: No  Sensorium  Memory:Immediate Good  Judgment:Fair  Insight:Fair  Executive Functions  Concentration:Fair  Attention Span:Fair  [place]  Psychomotor Activity  Psychomotor Activity:Psychomotor Activity: Normal   Assets  Assets:Communication Skills  Sleep  Sleep:Sleep: Poor   Physical Exam: Physical Exam Vitals and nursing note reviewed.  Constitutional:      Appearance: Normal appearance. She is normal weight.  HENT:     Head: Normocephalic and atraumatic.     Right Ear: There is no impacted cerumen.  Nose: No congestion or rhinorrhea.     Mouth/Throat:     Pharynx: No oropharyngeal exudate or posterior oropharyngeal erythema.  Eyes:     General:        Right eye: No discharge.        Left eye: No discharge.  Cardiovascular:     Rate and Rhythm: Regular rhythm.  Pulmonary:     Effort:  Pulmonary effort is normal.  Neurological:     General: No focal deficit present.     Mental Status: She is oriented to person, place, and time.  Psychiatric:        Thought Content: Thought content normal.   Review of Systems  Constitutional:  Negative for chills, fever and weight loss.  HENT: Negative.  Negative for congestion, nosebleeds and sore throat.   Eyes:  Negative for double vision and photophobia.  Respiratory:  Negative for shortness of breath.   Cardiovascular:  Negative for chest pain.  Gastrointestinal:  Negative for abdominal pain, constipation, diarrhea, heartburn, nausea and vomiting.  Skin:  Negative for itching and rash.  Neurological:  Negative for tingling, tremors, sensory change, speech change, focal weakness, seizures, weakness and headaches.  Psychiatric/Behavioral:  Positive for depression (improving with medications). Negative for hallucinations, memory loss, substance abuse and suicidal ideas. The patient is not nervous/anxious and does not have insomnia.   Blood pressure (!) 124/96, pulse (!) 55, temperature 97.9 F (36.6 C), temperature source Oral, resp. rate 20, height 5' 4.4" (1.636 m), weight 64 kg, last menstrual period 06/07/2010, SpO2 100 %. Body mass index is 23.94 kg/m.  Treatment Plan Summary: Daily contact with patient to assess and evaluate symptoms and progress in treatment and Medication management  ASSESSMENT Patient is a 60 year old female with history of MDD and GAD presenting from [place] long ED involuntarily due to suicide attempt via carbon monoxide poisoning (locking self in garage with car on).   PLAN Safety and Monitoring: Involuntary admission to inpatient psychiatric unit for safety, stabilization and treatment Daily contact with patient to assess and evaluate symptoms and progress in treatment Patient's case to be discussed in multi-disciplinary team meeting Observation Level : q15 minute checks Vital signs: q12  hours Precautions: suicide    MDD-recurrent episode, severe w/o psychotic features GAD w/ panic attacks Hx of alcohol abuse -Continue Lexapro 20 mg qd for depressive symptoms -Continue Seroquel 25 mg tid prn for anxiety   Insomnia -Continue Trazodone 100 mg nightly -Start Melatonin 5 mg nightly   Osteoarthritis -Ibuprofen 200 mg for moderate pain -Tylenol 650 mg q6 hour -Zanaflex 2 mg for muscle spasms   Migraines -Imitrex 100 mg q2hr prn   Sore Throat -Cepacol 3 mg prn   Constipation -Colace 100 mg qd -Miralax 17 g qd -Milk of Magnesia   PRNs Tylenol 650 mg for mild pain Maalox/Mylanta 30 mL for indigestion Hydroxyzine 25 mg tid for anxiety Milk of Magnesia 30 mL for constipation    Discharge Planning: Social work and case management to assist with discharge planning and identification of hospital follow-up needs prior to discharge Estimated LOS: 5-7 days Discharge Concerns: Need to establish a safety plan; Medication compliance and effectiveness Discharge Goals: Return home with outpatient referrals for mental health follow-up including medication management/psychotherapy Nicholes Rough, NP 06/05/2021, 2:56 PM Patient ID: [name], female   DOB: 1961/01/02, 59 y.o.   MRN: [id number]

## 2021-06-05 NOTE — Group Note (Signed)
Recreation Therapy Group Note   Group Topic:Stress Management  Group Date: 06/05/2021 Start Time: 0940 End Time: 0950 Facilitators: Victorino Sparrow, LRT,CTRS Location: [address] Dayroom   Goal Area(s) Addresses:  Patient will actively participate in stress management techniques presented during session.  Patient will successfully identify benefit of practicing stress management post d/c.   Group Description: Meditation. LRT played a meditation that focused on new horizons and being open to new things/experiences in the coming year.  Patients were to listen and follow along as meditation played to fully engage in activity.  Patients were given suggestions on using the Internet and Apps to locate meditations.   Affect/Mood: Appropriate   Participation Level: Engaged   Participation Quality: Independent   Behavior: Appropriate   Speech/Thought Process: Focused   Insight: Good   Judgement: Good   Modes of Intervention: Meditation   Patient Response to Interventions:  Engaged   Education Outcome:  Acknowledges education and In group clarification offered    Clinical Observations/Individualized Feedback: Pt attended and participated in group.    Plan: Continue to engage patient in RT group sessions 2-3x/week.   Victorino Sparrow, LRT,CTRS  06/05/2021 11:56 AM

## 2021-06-05 NOTE — Group Note (Signed)
LCSW Group Therapy Note   Group Date: 06/05/2021 Start Time: 1300 End Time: 1400  Type of Therapy and Topic:  Group Therapy:  Self-Esteem   Participation Level:  Active  Description of Group: This group addressed positive self-esteem. Patients were given a worksheet with a blank shield. Patients were asked what a shield is and when it is used. Patients were asked to list, draw, or write protective factors in the their lives on their shields. Patients discussed the words, ideas and drawings that they put on their shield. Patients were encouraged to have a daily reflection of positive characteristics/ protective factors.  Therapeutic Goals Patient will verbalize two of their positive qualities Patient will demonstrate insight but naming social supports in their lives Patient will verbalize their feelings when voicing positive self affirmations and when voicing positive affirmations of others Patients will discuss the potential positive impact on their wellness/recovery of focusing on positive traits of self and others.  Summary of Patient Progress: Pt shared her name during introductions and stated that she is looking forward to getting back into her morning routine of exercising and being back in her bed. Pt accepted worksheet and participated appropriately in discussion.  Mliss Fritz, LCSWA 06/05/2021  1:25 PM

## 2021-06-05 NOTE — Progress Notes (Signed)
DAR NOTE: Patient presents with anxious affect and mood.  Voiced concern about going home to care for her mother.  She is hoping to get discharge tomorrow.  Denies suicidal thoughts, auditory and visual hallucinations.  Rates depression at 1, hopelessness at 1, and anxiety at 2.  Maintained on routine safety checks.  Medications given as prescribed.  Support and encouragement offered as needed.  Attended group and participated.  States goal for today is "positivity and getting discharge."  Patient observed socializing with peers in the dayroom.  Motrin given for complain of back pain with good effect.  Patient is safe on and off the unit.

## 2021-06-05 NOTE — BHH Group Notes (Signed)
Group Note- Orientation and goals group with PsychoEducation.  Pt were asked to identify goal they would like to work on, and unit ward rules were reviewed as well as a schedule of the day.  ''I walk down the street by [employer]'' poem was read to identify negative behavioral patterns.  Pt identified her hole as past childhood trauma and identifying ways in which she can continue that work in therapy

## 2021-06-05 NOTE — BHH Group Notes (Signed)
Adult Psychoeducational Group Note  Date:  06/05/2021 Time:  2:50 PM  Group Topic/Focus:  Dimensions of Wellness:   The focus of this group is to introduce the topic of wellness and discuss the role each dimension of wellness plays in total health.  Participation Level:  Active  Participation Quality:  Appropriate  Affect:  Appropriate  Cognitive:  Appropriate  Insight: Appropriate  Engagement in Group:  Engaged  Modes of Intervention:  Discussion  Additional Comments:  Patient attended group and participated.   Shonda Mandarino W Kalliope Riesen 77/08/4033, 2:50 PM

## 2021-06-06 MED ORDER — VITAMIN D3 1.25 MG (50000 UT) PO CAPS
50000.0000 [IU] | ORAL_CAPSULE | ORAL | 0 refills | Status: DC
Start: 2021-06-07 — End: 2022-06-30

## 2021-06-06 MED ORDER — POLYETHYLENE GLYCOL 3350 17 G PO PACK: 17.0000 g | PACK | Freq: Every day | ORAL | 0 refills | Status: AC

## 2021-06-06 MED ORDER — MELATONIN 5 MG PO TABS
5.0000 mg | ORAL_TABLET | Freq: Every day | ORAL | 0 refills | Status: AC
Start: 2021-06-06 — End: ?

## 2021-06-06 MED ORDER — ESCITALOPRAM OXALATE 20 MG PO TABS
20.0000 mg | ORAL_TABLET | Freq: Every day | ORAL | 0 refills | Status: DC
Start: 2021-06-06 — End: 2021-07-03

## 2021-06-06 MED ORDER — TRAZODONE HCL 100 MG PO TABS: 100.0000 mg | ORAL_TABLET | Freq: Every day | ORAL | 0 refills | Status: DC

## 2021-06-06 MED ORDER — QUETIAPINE FUMARATE 25 MG PO TABS: 25.0000 mg | ORAL_TABLET | Freq: Three times a day (TID) | ORAL | 0 refills | Status: DC | PRN

## 2021-06-06 MED ORDER — DOCUSATE SODIUM 100 MG PO CAPS
100.0000 mg | ORAL_CAPSULE | Freq: Every day | ORAL | 0 refills | Status: AC
Start: 2021-06-07 — End: ?

## 2021-06-06 NOTE — Discharge Summary (Signed)
Physician Discharge Summary Note  Patient:  Gabriela Shepard is an 60 y.o., female  MRN:  [id number]  DOB:  11-15-1960  Patient phone:  [phone] (home)   Patient address:   [address] [place] [address],   Total Time spent with patient:  Greater than 30 minutes  Date of Admission:  06/03/2021  Date of Discharge: 06-06-21  Reason for Admission: Suicide attempt by carbon monoxide.  Principal Problem: MDD (major depressive disorder), recurrent severe, without psychosis ([place])  Discharge Diagnoses: Principal Problem:   MDD (major depressive disorder), recurrent severe, without psychosis ([place]) Active Problems:   PTSD (post-traumatic stress disorder)   Low serum sodium  Past Psychiatric History: Major depressive disorder, PTSD  Past Medical History:  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   History of UTI    Interstitial cystitis    Low sodium levels    Migraines    Osteoporosis    PONV (postoperative nausea and vomiting)    PTSD (post-traumatic stress disorder)    on meds   STD (sexually transmitted disease)    Substance abuse ([place])    hx of alcohol   Urine incontinence     Past Surgical History:  Procedure Laterality Date   BREAST BIOPSY Left    2017 benign x 2   CESAREAN SECTION  1992   COLONOSCOPY  2018   JMP-MAC-suprep(good)-polyps   INCONTINENCE SURGERY     LAPAROSCOPY     under bladder   NECK SURGERY  02/24/2018   ACDF   POLYPECTOMY  2018   polyps removed   TOTAL HIP ARTHROPLASTY Right 09/08/2020   TOTAL HIP ARTHROPLASTY Left 2018   TOTAL KNEE ARTHROPLASTY Left 12/23/2015   Procedure: TOTAL KNEE ARTHROPLASTY;   Surgeon: Melrose Nakayama, MD;  Location: [facility];  Service: Orthopedics;  Laterality: Left;   Family History:  Family History  Problem Relation Age of Onset   Hypertension Mother    Lung cancer Mother    Kidney cancer Father    Ulcers Father    Heart disease Father    Drug abuse Sister    Alcoholism Brother    Hypertension Maternal Grandmother    Alcohol abuse Other    Arthritis Other    Hypertension Other    Mental illness Other    Healthy Daughter    Healthy Son    Colon cancer Neg Hx    Esophageal cancer Neg Hx    Colon polyps Neg Hx    Rectal cancer Neg Hx    Stomach cancer Neg Hx    Family Psychiatric  History: See H&P. Social History:  Social History   Substance and Sexual Activity  Alcohol Use No   Comment: hx of ETOH abuse-sober since 11/2010      Social History   Substance and Sexual Activity  Drug Use No    Social History   Socioeconomic History   Marital status: Divorced    Spouse name: Not on file   Number of children: Not on file   Years of education: Not on file  Highest education level: Bachelor's degree (e.g., BA, AB, BS)  Occupational History   Not on file  Tobacco Use   Smoking status: Former    Packs/day: 0.50    Years: 10.00    Pack years: 5.00    Types: Cigarettes    Quit date: 11/13/2010    Years since quitting: 10.5   Smokeless tobacco: Never  Vaping Use   Vaping Use: Never used  Substance and Sexual Activity   Alcohol use: No    Comment: hx of ETOH abuse-sober since 11/2010    Drug use: No   Sexual activity: Not Currently    Birth control/protection: Post-menopausal  Other Topics Concern   Not on file  Social History Narrative   Not on file   Social Determinants of Health   Financial Resource Strain: Low Risk    Difficulty of Paying Living Expenses: Not hard at all  Food Insecurity: No Food Insecurity   Worried About Charity fundraiser in the Last Year: Never true   [place] in the Last Year: Never true   Transportation Needs: No Transportation Needs   Lack of Transportation (Medical): No   Lack of Transportation (Non-Medical): No  Physical Activity: Insufficiently Active   Days of Exercise per Week: 4 days   Minutes of Exercise per Session: 30 min  Stress: Stress Concern Present   Feeling of Stress : To some extent  Social Connections: Moderately Integrated   Frequency of Communication with Friends and Family: More than three times a week   Frequency of Social Gatherings with Friends and Family: Once a week   Attends Religious Services: 1 to 4 times per year   Active Member of [employer] or Organizations: Yes   Attends Archivist Meetings: 1 to 4 times per year   Marital Status: Divorced   Hospital Course: (Per Md's admission SRA notes): Gabriela Shepard presents with a history of depression, anxiety and PTSD, 10 years sober from alcohol, having been raised in a family with multiple addictions and history of personal trauma and abuse, with a chief complaint of suicide attempt (carbon monoxide). She has recently been feeling overwhelmed due to numerous stressors, insufficient supports, incompletely resolved traumas and medication undertreatment. She denies current hallucinations, delusions. She has not been sleepng or eating very well, and has been living from one crisis to another.  Prior to this discharge, Gabriela Shepard was seen & evaluated for mental health stability. The current laboratory findings were reviewed (stable), nurses notes & vital signs were reviewed as well. There are no current mental health or medical issues that should prevent this discharge at this time. Patient is being discharged to continue mental health care/medication management as noted below.   After the above admission evaluation, Gabriela Shepard presenting symptoms were noted. She brought to the [facility] for psychiatric evaluation due to suicide attempt by CO. Chart review indicated was triggered by personal stressors. She was recommended for  mood stabilization treatments by her treatment team. The medication regimen targeting those presenting symptoms were discussed & initiated with her consent. She was medicated, stabilized & discharged on the medications as listed on her discharge medication lists below. Besides the mood stabilization treatments, patient was was also enrolled & participated in the group counseling sessions being offered & held on this unit. She learned coping skills. She presented other significant pre-existing medical issues that required treatment. She resumed & discharged on those medications for those health issues. She tolerated her treatment regimen without any adverse effects  or reactions reported. Gabriela Shepard's symptoms responded well to her treatment regimen warranting this discharge. Patient is also mentally/medically stable & agreeable to this discharge.  During the course of this this hospitalization, there were no instances of behavioral issues that required restraints or immediate intervention. Patient remained safe on the unit. There were no instances of self-harming behavior. There were no threats to other patients/staff. Gabriela Shepard remained cooperative to her daily routines & taking her treatment regimen as recommended by her treatment team. She participated in the group sessions and interacted with staff and other patients appropriately.   Over the course of this hospitalization, patient's symptoms responded well to her treatment regimen & her mood has improved. She was able to maintain her personal hygiene. On this day of her hospital discharge, patient denies any thoughts of self-harm, suicidal/homicidal ideations, AVH, delusional thoughts or paranoia. She is not observed to be responding to internal any internal stimuli. She has been compliant with her recommended treatment regimen. She is currently showing readiness for discharge & is future-oriented thinking. Patient is encouraged to keep all psychiatric appointments and  continue with his medications as prescribed. She left [facility] with all personal belongings in no apparent distress. Transportation per her friend Richardson Landry.      Physical Findings: AIMS:  , ,  ,  ,    CIWA:    COWS:     Musculoskeletal: Strength & Muscle Tone: within normal limits Gait & Station: normal Patient leans: N/A  Psychiatric Specialty Exam:  Presentation  General Appearance: Appropriate for Environment; Casual; Fairly Groomed  Eye Contact:Good  Speech:Clear and Coherent; Normal Rate  Speech Volume:Normal  Handedness:Right  Mood and Affect  Mood:Euthymic  Affect:Appropriate; Congruent  Thought Process  Thought Processes:Coherent; Goal Directed; Linear  Descriptions of Associations:Intact  Orientation:Full (Time, Place and Person)  Thought Content:Logical  History of Schizophrenia/Schizoaffective disorder:No  Duration of Psychotic Symptoms:N/A  Hallucinations:Hallucinations: None  Ideas of Reference:None  Suicidal Thoughts:Suicidal Thoughts: No  Homicidal Thoughts:Homicidal Thoughts: No  Sensorium  Memory:Immediate Good; Recent Good; Remote Good  Judgment:Good  Insight:Good  Executive Functions  Concentration:Good  Attention Span:Good  Filer of Knowledge:Good  Language:Good  Psychomotor Activity  Psychomotor Activity:Psychomotor Activity: Normal  Assets  Assets:Communication Skills; Desire for Improvement; Financial Resources/Insurance; Housing; Physical Health; Resilience; Social Support  Sleep  Sleep:Sleep: Good Number of Hours of Sleep: 6  Physical Exam: Physical Exam Vitals and nursing note reviewed.  HENT:     Head: Normocephalic.     Nose: Nose normal.     Mouth/Throat:     Pharynx: Oropharynx is clear.  Eyes:     Pupils: Pupils are equal, round, and reactive to light.  Cardiovascular:     Rate and Rhythm: Normal rate.     Pulses: Normal pulses.  Pulmonary:     Effort: Pulmonary effort is normal.   Genitourinary:    Comments: Deferred Musculoskeletal:        General: Normal range of motion.     Cervical back: Normal range of motion.  Skin:    General: Skin is warm and dry.  Neurological:     General: No focal deficit present.     Mental Status: She is alert and oriented to person, place, and time. Mental status is at baseline.   Review of Systems  Constitutional:  Negative for chills, fever and malaise/fatigue.  HENT:  Negative for congestion and sore throat.   Eyes:  Negative for blurred vision.  Respiratory:  Negative for cough, shortness of breath and  wheezing.   Cardiovascular:  Negative for chest pain and palpitations.  Gastrointestinal:  Negative for abdominal pain, constipation, diarrhea, heartburn, nausea and vomiting.  Genitourinary:  Negative for dysuria.  Musculoskeletal:  Negative for joint pain and myalgias.  Skin: Negative.   Neurological:  Negative for dizziness, tingling, tremors, sensory change, speech change, focal weakness, seizures, loss of consciousness, weakness and headaches.  Endo/Heme/Allergies:        Allergies: Sulfa drugs  Psychiatric/Behavioral:  Positive for depression (Hx. (stable on medication).). Negative for hallucinations, memory loss, substance abuse and suicidal ideas. The patient has insomnia (Hx of (stable on medication).). The patient is not nervous/anxious (Stable upon discharge).   Blood pressure 117/82, pulse 69, temperature 97.9 F (36.6 C), temperature source Oral, resp. rate 20, height 5' 4.4" (1.636 m), weight 64 kg, last menstrual period 06/07/2010, SpO2 100 %. Body mass index is 23.94 kg/m.   Social History   Tobacco Use  Smoking Status Former   Packs/day: 0.50   Years: 10.00   Pack years: 5.00   Types: Cigarettes   Quit date: 11/13/2010   Years since quitting: 10.5  Smokeless Tobacco Never   Tobacco Cessation:  N/A, patient does not currently use tobacco products  Blood Alcohol level:  Lab Results  Component Value  Date   ETH <10 17/61/6073   Metabolic Disorder Labs:  Lab Results  Component Value Date   HGBA1C 5.5 06/03/2021   MPG 111.15 06/03/2021   No results found for: PROLACTIN Lab Results  Component Value Date   CHOL 207 (H) 06/03/2021   TRIG 33 06/03/2021   HDL 88 06/03/2021   CHOLHDL 2.4 06/03/2021   VLDL 7 06/03/2021   LDLCALC 112 (H) 06/03/2021   [place] 96 10/30/2020   See Psychiatric Specialty Exam and Suicide Risk Assessment completed by Attending Physician prior to discharge.  Discharge destination:  Home  Is patient on multiple antipsychotic therapies at discharge:  No   Has Patient had three or more failed trials of antipsychotic monotherapy by history:  No  Recommended Plan for Multiple Antipsychotic Therapies: NA  Allergies as of 06/06/2021       Reactions   Sulfa Antibiotics Hives        Medication List     STOP taking these medications    Aimovig 70 MG/ML Soaj Generic drug: Erenumab-aooe   clonazePAM 1 MG tablet Commonly known as: KLONOPIN   clotrimazole-betamethasone cream Commonly known as: LOTRISONE   DHEA 50 MG Tabs   Estradiol 10 MCG Tabs vaginal tablet   hydrocortisone 2.5 % rectal cream Commonly known as: ANUSOL-HC   hydrOXYzine 25 MG tablet Commonly known as: ATARAX   hyoscyamine 0.375 MG 12 hr tablet Commonly known as: LEVBID   meloxicam 7.5 MG tablet Commonly known as: MOBIC   multivitamin with minerals Tabs tablet   mupirocin ointment 2 % Commonly known as: BACTROBAN   ondansetron 4 MG disintegrating tablet Commonly known as: Zofran ODT   phenazopyridine 200 MG tablet Commonly known as: Pyridium   Prolia 60 MG/ML Sosy injection Generic drug: denosumab   Testosterone 1.62 % Gel   valACYclovir 500 MG tablet Commonly known as: VALTREX       TAKE these medications      Indication  docusate sodium 100 MG capsule Commonly known as: COLACE Take 1 capsule (100 mg total) by mouth daily. For constipation Start  taking on: June 07, 2021  Indication: Constipation   escitalopram 20 MG tablet Commonly known as: LEXAPRO Take 1 tablet (20  mg total) by mouth at bedtime. For depression What changed:  medication strength how much to take when to take this additional instructions  Indication: Generalized Anxiety Disorder, Major Depressive Disorder   esomeprazole 40 MG capsule Commonly known as: NEXIUM TAKE 1 CAPSULE BY MOUTH EVERY DAY What changed:  how much to take how to take this when to take this reasons to take this  Indication: Gastroesophageal Reflux Disease   fluticasone 50 MCG/ACT nasal spray Commonly known as: FLONASE Place 2 sprays into both nostrils daily. What changed:  when to take this reasons to take this  Indication: Allergic Rhinitis   melatonin 5 MG Tabs Take 1 tablet (5 mg total) by mouth at bedtime. For sleep  Indication: Trouble Sleeping   polyethylene glycol 17 g packet Commonly known as: MIRALAX / GLYCOLAX Take 17 g by mouth daily. For constipation Start taking on: June 07, 2021  Indication: Constipation   SUMAtriptan 100 MG tablet Commonly known as: IMITREX TAKE 1 TABLET BY MOUTH AS NEEDED FOR MIGRAINE HEADACHE, MAY REPEAT ONCE IN TWO HOURS AS NEEDED BUT NO MORE THAN 2 IN 24 HOURS. What changed: See the new instructions.  Indication: Migraine Headache   tiZANidine 2 MG tablet Commonly known as: ZANAFLEX Take 1 tablet (2 mg total) by mouth at bedtime.  Indication: Muscle Spasticity   traZODone 100 MG tablet Commonly known as: DESYREL Take 1 tablet (100 mg total) by mouth at bedtime. For sleep What changed:  medication strength how much to take how to take this when to take this additional instructions  Indication: Trouble Sleeping   Vitamin D3 1.25 MG (50000 UT) Caps Take 50,000 Units by mouth once a week. For bone health Start taking on: June 07, 2021 What changed: See the new instructions.  Indication: Vitamin D Deficiency, Bone  health        Follow-up Information     Jannetta Quint Follow up on 06/15/2021.   Why: You have an appointment on 06/15/2020 at 12:00pm for therapy.  This will be in-person.  Please give your therapist a call if you need an earlier time or date. Contact information: Address: [place] [address], [address] [place], [place] [address]  Phone: [phone] Fax: [phone]        Eulas Post, MD. Go on 06/22/2021.   Specialty: Family Medicine Why: You have an appointment with your primary care provider for medication management services on 06/22/21 at 2:15 pm.  This appointment will be held in person. Contact information: [place] [address] [phone]         [facility]. Call.   Why: This organization has combined services with the former [facility].  They provide free support groups and classes about mental health issues.  Please contact them if you would like more information. Contact information: [address] [place], [place] [address] Phone: [phone]               Follow-up recommendations: Activity:  As tolerated Diet: As recommended by your primary care doctor. Keep all scheduled follow-up appointments as recommended.    Comments: Comments: Patient is recommended to follow-up care on an outpatient basis as noted above. Prescriptions sent to pt's pharmacy of choice at discharge.   Patient agreeable to plan.   Given opportunity to ask questions.   Appears to feel comfortable with discharge denies any current suicidal or homicidal thought. Patient is also instructed prior to discharge  to: Take all medications as prescribed by his/her mental healthcare provider. Report any adverse effects and or reactions from the medicines to his/her outpatient provider promptly. Patient has been instructed & cautioned: To not engage in alcohol and or illegal drug use while on prescription  medicines. In the event of worsening symptoms, patient is instructed to call the crisis hotline, 911 and or go to the nearest ED for appropriate evaluation and treatment of symptoms. To follow-up with his/her primary care provider for your other medical issues, concerns and or health care needs.   Signed: Lindell Spar, NP, pmhnp, fnp-bc 06/06/2021, 10:04 AM

## 2021-06-06 NOTE — Progress Notes (Signed)
°   06/06/21 1000  Psych Admission Type (Psych Patients Only)  Admission Status Involuntary  Psychosocial Assessment  Patient Complaints Anxiety  Eye Contact Fair  Facial Expression Animated;Anxious  Affect Appropriate to circumstance  Speech Logical/coherent  Interaction Assertive  Motor Activity  (steady gati)  Appearance/Hygiene Unremarkable  Behavior Characteristics Cooperative  Mood Anxious;Pleasant  Thought Process  Coherency WDL  Content WDL  Delusions None reported or observed  Perception WDL  Hallucination None reported or observed  Judgment WDL  Confusion None  Danger to Self  Current suicidal ideation? Denies  Danger to Others  Danger to Others None reported or observed

## 2021-06-06 NOTE — Progress Notes (Signed)
°  [facility] Adult Case Management Discharge Plan :  Will you be returning to the same living situation after discharge:  Yes,  home At discharge, do you have transportation home?: Yes,  friend, Delford Field you have the ability to pay for your medications: Yes,  insurance  Release of information consent forms completed and in the chart; emailed to medical records and placed in medical records box.  Patient to Follow up at:  Follow-up Information     Jannetta Quint Follow up on 06/15/2021.   Why: You have an appointment on 06/15/2020 at 12:00pm for therapy.  This will be in-person.  Please give your therapist a call if you need an earlier time or date. Contact information: Address: [place] [address], [address] [place], [place] [address]  Phone: [phone] Fax: [phone]        Eulas Post, MD. Go on 06/22/2021.   Specialty: Family Medicine Why: You have an appointment with your primary care provider for medication management services on 06/22/21 at 2:15 pm.  This appointment will be held in person. Contact information: [place] [address] [phone]         [facility]. Call.   Why: This organization has combined services with the former [place].  They provide free support groups and classes about mental health issues.  Please contact them if you would like more information. Contact information: [address], [place], [place] [address] Phone: [phone]                Next level of care provider has access to [place] and Suicide Prevention discussed: Yes,  CSW spoke with daughter     Has patient been referred to the Quitline?: N/A patient is not a smoker  Patient has been referred for addiction treatment: N/A  [employer], Latanya Presser 06/06/2021, 9:57 AM
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
## 2021-06-06 NOTE — Progress Notes (Signed)
Pt discharged to lobby. Pt was stable and appreciative at that time. All papers and electronic prescriptions were given and valuables returned. Verbal understanding expressed. Denies SI/HI and A/VH. Pt given opportunity to express concerns and ask questions.  

## 2021-06-06 NOTE — Group Note (Signed)
[facility] LCSW Group Therapy Note  Date:  06/06/2021   Type of Therapy and Topic:  Group Therapy:  Focus for the New Year  Participation Level:  Active   Description of Group:  The focus of this group was to provide patients with an opportunity to think about and discuss what they can work on this coming year that will result in them being happier and healthier one year from today.  It was reviewed how "new year's resolutions" often fade in importance within a few days, so patients were encouraged to think in a broader, more impactful way about what they wish to focus on to change their lives.  Therapeutic Goals Patients discussed in general the benefit of having goals to work on Patients described their own personal goals/focus for the next year that will enable them to be happier and healthier Patients received encouragement from each other and CSW Patients provided support and ideas to each other  Summary of Patient Progress: During group, patient expressed that her focus for the upcoming year is going to be to engage less in seeking approval from others and needing affirmation.  Additionally, pt stated that she wants to work on saying no so that healthier boundaries are established.    Therapeutic Modalities Processing   Selmer Dominion, LCSW (led group)  Lahoma Crocker, LCSWA (wrote notes)

## 2021-06-06 NOTE — Progress Notes (Signed)
D- Patient alert and oriented x4.Denies SI, HI, AVH. Pt interacting positively with peers. Pt irritable because she was not able to speak to her daughter on the phone during group hours. Pt upset and stated she has not used the phone since she was admitted. Pt was "forward" with [facility] because she saw someone using the phone before her.   A- Scheduled medications administered to patient, per MD orders.Routine safety checks conducted every 15 minutes.  Patient informed to notify staff with problems or concerns.   R- Patient compliant with medications and verbalized understanding treatment plan.    06/05/21 2120  Psych Admission Type (Psych Patients Only)  Admission Status Involuntary  Psychosocial Assessment  Patient Complaints Irritability  Eye Contact Fair  Facial Expression Pensive;Anxious  Affect Anxious;Irritable  Speech Logical/coherent  Interaction Assertive  Motor Activity Other (Comment) (WNL)  Appearance/Hygiene Unremarkable  Behavior Characteristics Irritable  Mood Irritable  Thought Process  Coherency WDL  Content Blaming self  Delusions None reported or observed  Perception WDL  Hallucination None reported or observed  Judgment Limited  Confusion None  Danger to Self  Current suicidal ideation? Denies  Danger to Others  Danger to Others None reported or observed

## 2021-06-08 ENCOUNTER — Other Ambulatory Visit: Payer: Self-pay | Admitting: Family Medicine

## 2021-06-08 DIAGNOSIS — F4312 Post-traumatic stress disorder, chronic: Secondary | ICD-10-CM | POA: Diagnosis not present

## 2021-06-09 ENCOUNTER — Ambulatory Visit: Payer: BC Managed Care – PPO | Admitting: Family Medicine

## 2021-06-09 VITALS — BP 120/70 | HR 75 | Temp 97.5°F | Wt 146.8 lb

## 2021-06-09 DIAGNOSIS — F339 Major depressive disorder, recurrent, unspecified: Secondary | ICD-10-CM | POA: Diagnosis not present

## 2021-06-09 DIAGNOSIS — F5104 Psychophysiologic insomnia: Secondary | ICD-10-CM | POA: Diagnosis not present

## 2021-06-09 MED ORDER — CLONAZEPAM 1 MG PO TABS: 1.0000 mg | ORAL_TABLET | Freq: Two times a day (BID) | ORAL | 1 refills | Status: DC | PRN

## 2021-06-09 NOTE — Telephone Encounter (Signed)
Please advise. Rx not on current med list.
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
## 2021-06-12 DIAGNOSIS — F4312 Post-traumatic stress disorder, chronic: Secondary | ICD-10-CM | POA: Diagnosis not present

## 2021-06-15 DIAGNOSIS — F4312 Post-traumatic stress disorder, chronic: Secondary | ICD-10-CM | POA: Diagnosis not present

## 2021-06-18 DIAGNOSIS — F4312 Post-traumatic stress disorder, chronic: Secondary | ICD-10-CM | POA: Diagnosis not present

## 2021-06-22 ENCOUNTER — Inpatient Hospital Stay: Payer: BC Managed Care – PPO | Admitting: Family Medicine

## 2021-06-29 DIAGNOSIS — F4312 Post-traumatic stress disorder, chronic: Secondary | ICD-10-CM | POA: Diagnosis not present

## 2021-07-02 ENCOUNTER — Other Ambulatory Visit: Payer: Self-pay | Admitting: Family Medicine

## 2021-07-02 NOTE — Telephone Encounter (Signed)
Please advise. Ondansetron is not on the current med list.

## 2021-07-03 DIAGNOSIS — F4312 Post-traumatic stress disorder, chronic: Secondary | ICD-10-CM | POA: Diagnosis not present

## 2021-07-06 DIAGNOSIS — F4312 Post-traumatic stress disorder, chronic: Secondary | ICD-10-CM | POA: Diagnosis not present

## 2021-07-09 DIAGNOSIS — F4312 Post-traumatic stress disorder, chronic: Secondary | ICD-10-CM | POA: Diagnosis not present

## 2021-07-13 DIAGNOSIS — F4312 Post-traumatic stress disorder, chronic: Secondary | ICD-10-CM | POA: Diagnosis not present

## 2021-07-17 DIAGNOSIS — F4312 Post-traumatic stress disorder, chronic: Secondary | ICD-10-CM | POA: Diagnosis not present

## 2021-07-24 ENCOUNTER — Other Ambulatory Visit: Payer: Self-pay | Admitting: Family Medicine

## 2021-07-25 ENCOUNTER — Other Ambulatory Visit: Payer: Self-pay | Admitting: Family Medicine

## 2021-07-27 NOTE — Telephone Encounter (Signed)
Routed to PCP 

## 2021-07-28 DIAGNOSIS — H00021 Hordeolum internum right upper eyelid: Secondary | ICD-10-CM | POA: Diagnosis not present

## 2021-07-31 ENCOUNTER — Other Ambulatory Visit: Payer: Self-pay | Admitting: Family Medicine

## 2021-07-31 DIAGNOSIS — F4312 Post-traumatic stress disorder, chronic: Secondary | ICD-10-CM | POA: Diagnosis not present

## 2021-08-03 DIAGNOSIS — F4312 Post-traumatic stress disorder, chronic: Secondary | ICD-10-CM | POA: Diagnosis not present

## 2021-08-06 DIAGNOSIS — H00021 Hordeolum internum right upper eyelid: Secondary | ICD-10-CM | POA: Diagnosis not present

## 2021-08-07 ENCOUNTER — Other Ambulatory Visit: Payer: Self-pay

## 2021-08-07 DIAGNOSIS — F4312 Post-traumatic stress disorder, chronic: Secondary | ICD-10-CM | POA: Diagnosis not present

## 2021-08-07 MED ORDER — ESTRADIOL 10 MCG VA TABS: 1.0000 | ORAL_TABLET | VAGINAL | 2 refills | Status: DC

## 2021-08-10 DIAGNOSIS — F4312 Post-traumatic stress disorder, chronic: Secondary | ICD-10-CM | POA: Diagnosis not present

## 2021-08-14 DIAGNOSIS — F4312 Post-traumatic stress disorder, chronic: Secondary | ICD-10-CM | POA: Diagnosis not present

## 2021-08-17 DIAGNOSIS — F4312 Post-traumatic stress disorder, chronic: Secondary | ICD-10-CM | POA: Diagnosis not present

## 2021-08-24 DIAGNOSIS — F4312 Post-traumatic stress disorder, chronic: Secondary | ICD-10-CM | POA: Diagnosis not present

## 2021-08-28 ENCOUNTER — Other Ambulatory Visit: Payer: Self-pay | Admitting: Family Medicine

## 2021-09-07 DIAGNOSIS — F4312 Post-traumatic stress disorder, chronic: Secondary | ICD-10-CM | POA: Diagnosis not present

## 2021-09-11 DIAGNOSIS — F4312 Post-traumatic stress disorder, chronic: Secondary | ICD-10-CM | POA: Diagnosis not present

## 2021-09-14 DIAGNOSIS — F4312 Post-traumatic stress disorder, chronic: Secondary | ICD-10-CM | POA: Diagnosis not present

## 2021-09-18 DIAGNOSIS — F4312 Post-traumatic stress disorder, chronic: Secondary | ICD-10-CM | POA: Diagnosis not present

## 2021-09-21 DIAGNOSIS — F4312 Post-traumatic stress disorder, chronic: Secondary | ICD-10-CM | POA: Diagnosis not present

## 2021-09-23 DIAGNOSIS — F4312 Post-traumatic stress disorder, chronic: Secondary | ICD-10-CM | POA: Diagnosis not present

## 2021-09-24 NOTE — Progress Notes (Signed)
?Charlann Boxer D.O. ?[facility] ?[place] ?Phone: [phone] ?Subjective:   ?I, Gabriela Shepard, am serving as a Education administrator for Dr. Hulan Saas. ?This visit occurred during the SARS-CoV-2 public health emergency.  Safety protocols were in place, including screening questions prior to the visit, additional usage of staff PPE, and extensive cleaning of exam room while observing appropriate contact time as indicated for disinfecting solutions.  ? ?I'm seeing this patient by the request  of:  Eulas Post, MD ? ?CC: Neck and back pain follow-up ? ?HER:[id number]  ?Gabriela Shepard is a 61 y.o. female coming in with complaint of back and neck pain. OMT 05/25/2021. Patient states wants you to take a look at her MRI. Thinks a rib on right side is out. Lower and upper back on and off. LBP more with standing and activity. Upper back constant pain. Has a lot of joint pain. ? ?Medications patient has been prescribed: None ? ?Taking: ? ? ?  ? ? ? ? ?Reviewed prior external information including notes and imaging from previsou exam, outside providers and external EMR if available.  ? ?As well as notes that were available from care everywhere and other healthcare systems. ? ?Past medical history, social, surgical and family history all reviewed in electronic medical record.  No pertanent information unless stated regarding to the chief complaint.  ? ?Past Medical History:  ?Diagnosis Date  ? Allergy   ? seasonal allergies  ? 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 Bowel habit changes   ? been going on couple years  ? Depression   ? on meds  ? Flatulence   ? excessive with strong odor/uncontrollable  ? GERD (gastroesophageal reflux disease)   ? PRN meds  ? Headache   ? History of alcohol abuse   ? 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 History of UTI   ? Interstitial cystitis   ? Low sodium levels   ? Migraines   ? Osteoporosis   ? PONV (postoperative nausea and vomiting)   ? PTSD (post-traumatic stress  disorder)   ? on meds  ? STD (sexually transmitted disease)   ? Substance abuse ([place])   ? hx of alcohol  ? Urine incontinence   ?  ?Allergies  ?Allergen Reactions  ? Sulfa Antibiotics Hives  ? ? ? ?Review of Systems: ? No headache, visual changes, nausea, vomiting, diarrhea, constipation, dizziness, abdominal pain, skin rash, fevers, chills, night sweats, weight loss, swollen lymph nodes, body aches, joint swelling, chest pain, shortness of breath, mood changes. POSITIVE muscle aches ? ?Objective  ?Blood pressure 94/62, pulse 66, height '5\' 4"'$  (1.626 m), weight 145 lb (65.8 kg), last menstrual period 06/07/2010, SpO2 97 %. ?  ?General: No apparent distress alert and oriented x3 mood and affect normal, dressed appropriately.  ?HEENT: Pupils equal, extraocular movements intact  ?Respiratory: Patient's speak in full sentences and does not appear short of breath  ?Cardiovascular: No lower extremity edema, non tender, no erythema  ?Neck exam does have some mild loss of lordosis.  Some tenderness to palpation of the paraspinal musculature.  Tightness noted in the right parascapular region.  Patient has had difficulty with sidebending.  Low back exam does have some loss of lordosis as well.  Worsening pain with any extension of the back noted.  Very mild increase in radicular symptoms with a straight leg test at 30 degrees.  Only right side.  No significant weakness. ? ?Osteopathic findings ? ?  C2 flexed rotated and side bent right ?C6 flexed rotated and side bent right ?T3 extended rotated and side bent right inhaled rib ?T9 extended rotated and side bent left ?L2 flexed rotated and side bent right ?Sacrum right on right ? ? ? ? ?  ?Assessment and Plan: ? ?Lumbar radiculopathy ?Patient has intermittent lumbar radiculopathy.  Patient had radiofrequency ablation from an outside facility with no significant benefit.  Patient does bring in her MRI today.  MRI did show the patient did have an L5-S1 edema and arthritic changes  with a synovial cyst noted.  With patient's symptoms with worsening pain with extension concerned that the cyst may be bigger than what it was previously.  Would like to consider the possibility of a facet injection to see how patient responds.  If patient does get some relief then we can potentially restart osteopathic manipulation of the low back and potentially again with formal physical therapy.  Patient is going to increase activity slowly otherwise.  Follow-up again in 8 to 6 weeks otherwise.  ? ?Nonallopathic problems ? ?Decision today to treat with OMT was based on Physical Exam ? ?After verbal consent patient was treated with  ME, FPR techniques in cervical, rib, thoracic, lumbar, and sacral  areas avoided any HVLA on the lower back secondary to the amount of tightness and severity of pain with extension. ? ?Patient tolerated the procedure well with improvement in symptoms ? ?Patient given exercises, stretches and lifestyle modifications ? ?See medications in patient instructions if given ? ?Patient will follow up in 4-8 weeks ? ?  ? ?The above documentation has been reviewed and is accurate and complete Lyndal Pulley, DO ? ? ? ?  ? ? Note: This dictation was prepared with Dragon dictation along with smaller phrase technology. Any transcriptional errors that result from this process are unintentional.    ?  ?  ? ?

## 2021-09-25 ENCOUNTER — Ambulatory Visit: Payer: BC Managed Care – PPO | Admitting: Family Medicine

## 2021-09-25 VITALS — BP 94/62 | HR 66 | Ht 64.0 in | Wt 145.0 lb

## 2021-09-25 DIAGNOSIS — M9901 Segmental and somatic dysfunction of cervical region: Secondary | ICD-10-CM | POA: Diagnosis not present

## 2021-09-25 DIAGNOSIS — M5459 Other low back pain: Secondary | ICD-10-CM

## 2021-09-25 DIAGNOSIS — M9902 Segmental and somatic dysfunction of thoracic region: Secondary | ICD-10-CM

## 2021-09-25 DIAGNOSIS — M9904 Segmental and somatic dysfunction of sacral region: Secondary | ICD-10-CM

## 2021-09-25 DIAGNOSIS — M9903 Segmental and somatic dysfunction of lumbar region: Secondary | ICD-10-CM | POA: Diagnosis not present

## 2021-09-25 DIAGNOSIS — M9908 Segmental and somatic dysfunction of rib cage: Secondary | ICD-10-CM

## 2021-09-25 DIAGNOSIS — M5416 Radiculopathy, lumbar region: Secondary | ICD-10-CM | POA: Diagnosis not present

## 2021-09-25 MED ORDER — METHYLPREDNISOLONE ACETATE 40 MG/ML IJ SUSP
40.0000 mg | Freq: Once | INTRAMUSCULAR | Status: AC
  Administered 2021-09-25: 40 mg via INTRAMUSCULAR

## 2021-09-25 MED ORDER — KETOROLAC TROMETHAMINE 60 MG/2ML IM SOLN
60.0000 mg | Freq: Once | INTRAMUSCULAR | Status: AC
  Administered 2021-09-25: 60 mg via INTRAMUSCULAR

## 2021-09-25 NOTE — Patient Instructions (Signed)
R L5/S1 facet injection [phone] ?Injections in backside today ?See me in 7-8 weeks ?

## 2021-09-25 NOTE — Assessment & Plan Note (Signed)
Patient has intermittent lumbar radiculopathy.  Patient had radiofrequency ablation from an outside facility with no significant benefit.  Patient does bring in her MRI today.  MRI did show the patient did have an L5-S1 edema and arthritic changes with a synovial cyst noted.  With patient's symptoms with worsening pain with extension concerned that the cyst may be bigger than what it was previously.  Would like to consider the possibility of a facet injection to see how patient responds.  If patient does get some relief then we can potentially restart osteopathic manipulation of the low back and potentially again with formal physical therapy.  Patient is going to increase activity slowly otherwise.  Follow-up again in 8 to 6 weeks otherwise. ?

## 2021-09-26 ENCOUNTER — Other Ambulatory Visit: Payer: Self-pay | Admitting: Family Medicine

## 2021-09-28 ENCOUNTER — Other Ambulatory Visit: Payer: Self-pay

## 2021-09-28 MED ORDER — ESCITALOPRAM OXALATE 20 MG PO TABS
20.0000 mg | ORAL_TABLET | Freq: Every day | ORAL | 0 refills | Status: DC
Start: 2021-09-28 — End: 2022-02-01

## 2021-09-28 NOTE — Telephone Encounter (Signed)
Refill for Lexapro has been sent to pharmacy. ? ?Last refill Clonazepam '1mg'$ - 06/09/2021-60 tabs, 1 refill ? ?Last OV- 06/09/21 ? ?No future OV scheduled.  ?

## 2021-09-30 DIAGNOSIS — F4312 Post-traumatic stress disorder, chronic: Secondary | ICD-10-CM | POA: Diagnosis not present

## 2021-09-30 NOTE — Discharge Instructions (Signed)

## 2021-10-01 ENCOUNTER — Ambulatory Visit
Admission: RE | Admit: 2021-10-01 | Discharge: 2021-10-01 | Disposition: A | Discharge status: Home / Self Care | Payer: BC Managed Care – PPO | Source: Ambulatory Visit | Attending: Family Medicine | Admitting: Family Medicine

## 2021-10-01 DIAGNOSIS — M4696 Unspecified inflammatory spondylopathy, lumbar region: Secondary | ICD-10-CM | POA: Diagnosis not present

## 2021-10-01 DIAGNOSIS — M5459 Other low back pain: Secondary | ICD-10-CM

## 2021-10-01 MED ORDER — IOPAMIDOL (ISOVUE-M 200) INJECTION 41%
1.0000 mL | Freq: Once | INTRAMUSCULAR | Status: AC
Start: 2021-10-01 — End: 2021-10-01
  Administered 2021-10-01: 1 mL via INTRA_ARTICULAR

## 2021-10-01 MED ORDER — METHYLPREDNISOLONE ACETATE 40 MG/ML INJ SUSP (RADIOLOG
80.0000 mg | Freq: Once | INTRAMUSCULAR | Status: AC
  Administered 2021-10-01: 80 mg via INTRA_ARTICULAR

## 2021-10-02 DIAGNOSIS — F4312 Post-traumatic stress disorder, chronic: Secondary | ICD-10-CM | POA: Diagnosis not present

## 2021-10-05 DIAGNOSIS — F4312 Post-traumatic stress disorder, chronic: Secondary | ICD-10-CM | POA: Diagnosis not present

## 2021-10-09 DIAGNOSIS — F4312 Post-traumatic stress disorder, chronic: Secondary | ICD-10-CM | POA: Diagnosis not present

## 2021-10-12 DIAGNOSIS — F4312 Post-traumatic stress disorder, chronic: Secondary | ICD-10-CM | POA: Diagnosis not present

## 2021-10-16 DIAGNOSIS — F4312 Post-traumatic stress disorder, chronic: Secondary | ICD-10-CM | POA: Diagnosis not present

## 2021-10-20 DIAGNOSIS — F4312 Post-traumatic stress disorder, chronic: Secondary | ICD-10-CM | POA: Diagnosis not present

## 2021-10-22 NOTE — Progress Notes (Signed)
[name] [place] Phone: [phone] Subjective:   Gabriela Shepard, am serving as a scribe for Dr. Hulan Saas.  I'm seeing this patient by the request  of:  Eulas Post, MD  CC: back and neck pain   [id number]  Gabriela Shepard is a 61 y.o. female coming in with complaint of back and neck pain. OMT 09/25/2021. Patient states that her back has been really bad and neck will be good then be in pain for a week and it turns into a headache. Patient is also complaining about her skin is burning and itching which has also been bothering her. Doesn't have lupus was diagnosed with fibro and also is very fatigued.   Taking:Zanaflex         Reviewed prior external information including notes and imaging from previsou exam, outside providers and external EMR if available.   As well as notes that were available from care everywhere and other healthcare systems.  Past medical history, social, surgical and family history all reviewed in electronic medical record.  No pertanent information unless stated regarding to the chief complaint.   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   History of UTI    Interstitial cystitis    Low sodium levels    Migraines    Osteoporosis    PONV (postoperative nausea and vomiting)    PTSD (post-traumatic stress disorder)    on meds   STD (sexually transmitted disease)    Substance abuse ([facility])    hx of alcohol   Urine incontinence     Allergies  Allergen Reactions   Sulfa Antibiotics Hives     Review of Systems:  No headache, visual changes, nausea,  vomiting, diarrhea, constipation, dizziness, abdominal pain, skin rash, fevers, chills, night sweats, weight loss, swollen lymph nodes, body aches, joint swelling, chest pain, shortness of breath, mood changes. POSITIVE muscle aches  Objective  Blood pressure 102/70, pulse 65, height '5\' 4"'$  (1.626 m), last menstrual period 06/07/2010, SpO2 98 %.   General: No apparent distress alert and oriented x3 mood and affect normal, dressed appropriately.  HEENT: Pupils equal, extraocular movements intact  Respiratory: Patient's speak in full sentences and does not appear short of breath  Cardiovascular: No lower extremity edema, non tender, no erythema  Neck does have some loss of lordosis.  Patient is tender to palpation in multiple areas.  The patient does have tightness also noted in the parascapular region right greater than left.  Low back exam does have pain over the sacroiliac joint.  Increased tightness noted of the straight leg test with possible mild radicular symptoms of the right leg.  This seemed to start at 25 degrees.  Osteopathic findings  C2 flexed rotated and side bent right C5 flexed rotated and side bent left T3 extended rotated and side bent right inhaled rib T9 extended rotated and side bent left L2 flexed rotated and side bent right Sacrum right on right     Assessment and Plan:  Polyarthralgia  Continues to have fibromyalgia, discussed with patient at great length.  Discussed when possible change will be to change the Lexapro to Cymbalta.  With patient how well she is doing at the moment mentally she would like to not make this change.  I think that this is a good idea and we will contact come back to it another point if necessary.  Patient knows worsening pain to we can consider other medications but I am concerned.  Follow-up with me again 6 to 8 weeks otherwise.    Nonallopathic problems  Decision today to treat with OMT was based on Physical Exam  After verbal consent  patient was treated with HVLA, ME, FPR techniques in cervical, rib, thoracic, lumbar, and sacral  areas  Patient tolerated the procedure well with improvement in symptoms  Patient given exercises, stretches and lifestyle modifications  See medications in patient instructions if given  Patient will follow up in 4-8 weeks      The above documentation has been reviewed and is accurate and complete Lyndal Pulley, DO        Note: This dictation was prepared with Dragon dictation along with smaller phrase technology. Any transcriptional errors that result from this process are unintentional.

## 2021-10-23 ENCOUNTER — Ambulatory Visit: Payer: BC Managed Care – PPO | Admitting: Family Medicine

## 2021-10-23 VITALS — BP 102/70 | HR 65 | Ht 64.0 in

## 2021-10-23 DIAGNOSIS — M9903 Segmental and somatic dysfunction of lumbar region: Secondary | ICD-10-CM | POA: Diagnosis not present

## 2021-10-23 DIAGNOSIS — M9904 Segmental and somatic dysfunction of sacral region: Secondary | ICD-10-CM | POA: Diagnosis not present

## 2021-10-23 DIAGNOSIS — M9902 Segmental and somatic dysfunction of thoracic region: Secondary | ICD-10-CM

## 2021-10-23 DIAGNOSIS — M9901 Segmental and somatic dysfunction of cervical region: Secondary | ICD-10-CM | POA: Diagnosis not present

## 2021-10-23 DIAGNOSIS — M5416 Radiculopathy, lumbar region: Secondary | ICD-10-CM

## 2021-10-23 DIAGNOSIS — M9908 Segmental and somatic dysfunction of rib cage: Secondary | ICD-10-CM

## 2021-10-23 DIAGNOSIS — M2550 Pain in unspecified joint: Secondary | ICD-10-CM

## 2021-10-23 MED ORDER — KETOROLAC TROMETHAMINE 60 MG/2ML IM SOLN
60.0000 mg | Freq: Once | INTRAMUSCULAR | Status: AC
  Administered 2021-10-23: 60 mg via INTRAMUSCULAR

## 2021-10-23 MED ORDER — METHYLPREDNISOLONE ACETATE 80 MG/ML IJ SUSP
80.0000 mg | Freq: Once | INTRAMUSCULAR | Status: AC
  Administered 2021-10-23: 80 mg via INTRAMUSCULAR

## 2021-10-23 NOTE — Assessment & Plan Note (Signed)
Patient continues to have difficulty, attempted osteopathic manipulation again but is having difficulty with this.  Due to patient's quality of life and difficulty with different medications I would like patient to talk to surgery to discuss if there is any other surgical intervention that could be beneficial.  Patient will be referred and see with patient failing all other conservative therapy at this moment.

## 2021-10-23 NOTE — Patient Instructions (Addendum)
Good to see you  Referral to neuro dr. Reatha Armour Hydroxzine as needed  Other ideas change to Cymbalta Injection given  Follow up 6-8 weeks

## 2021-10-23 NOTE — Assessment & Plan Note (Addendum)
Continues to have fibromyalgia, discussed with patient at great length.  Discussed when possible change will be to change the Lexapro to Cymbalta.  With patient how well she is doing at the moment mentally she would like to not make this change.  I think that this is a good idea and we will contact come back to it another point if necessary.  Patient knows worsening pain to we can consider other medications but I am concerned.  Follow-up with me again 6 to 8 weeks otherwise.  Total time discussing with patient 31 minutes

## 2021-10-26 DIAGNOSIS — F4312 Post-traumatic stress disorder, chronic: Secondary | ICD-10-CM | POA: Diagnosis not present

## 2021-10-27 ENCOUNTER — Other Ambulatory Visit: Payer: Self-pay | Admitting: Family Medicine

## 2021-10-28 ENCOUNTER — Other Ambulatory Visit (HOSPITAL_COMMUNITY)
Admission: RE | Admit: 2021-10-28 | Discharge: 2021-10-28 | Disposition: A | Discharge status: Home / Self Care | Payer: BC Managed Care – PPO | Source: Ambulatory Visit | Attending: Obstetrics and Gynecology | Admitting: Obstetrics and Gynecology

## 2021-10-28 ENCOUNTER — Ambulatory Visit: Payer: BC Managed Care – PPO | Admitting: Obstetrics and Gynecology

## 2021-10-28 ENCOUNTER — Encounter: Payer: Self-pay | Admitting: Obstetrics and Gynecology

## 2021-10-28 VITALS — BP 127/75 | HR 68

## 2021-10-28 DIAGNOSIS — M62838 Other muscle spasm: Secondary | ICD-10-CM

## 2021-10-28 DIAGNOSIS — N3010 Interstitial cystitis (chronic) without hematuria: Secondary | ICD-10-CM

## 2021-10-28 DIAGNOSIS — R102 Pelvic and perineal pain: Secondary | ICD-10-CM | POA: Diagnosis not present

## 2021-10-28 DIAGNOSIS — N952 Postmenopausal atrophic vaginitis: Secondary | ICD-10-CM

## 2021-10-28 MED ORDER — HYDROXYZINE HCL 50 MG PO TABS: 50.0000 mg | ORAL_TABLET | Freq: Every evening | ORAL | 11 refills | Status: DC

## 2021-10-28 MED ORDER — NONFORMULARY OR COMPOUNDED ITEM: 5 refills | Status: AC

## 2021-10-28 MED ORDER — ESTRADIOL 10 MCG VA TABS: 1.0000 | ORAL_TABLET | VAGINAL | 11 refills | Status: DC
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
## 2021-10-29 ENCOUNTER — Encounter: Payer: Self-pay | Admitting: Family Medicine

## 2021-10-29 ENCOUNTER — Encounter: Payer: Self-pay | Admitting: Obstetrics and Gynecology

## 2021-10-29 NOTE — Progress Notes (Signed)
RX order and demographics for Compounded item has been faxed to [facility] [phone]

## 2021-10-30 DIAGNOSIS — F4312 Post-traumatic stress disorder, chronic: Secondary | ICD-10-CM | POA: Diagnosis not present

## 2021-10-30 LAB — CERVICOVAGINAL ANCILLARY ONLY
Bacterial Vaginitis (gardnerella): NEGATIVE
Candida Glabrata: NEGATIVE
Candida Vaginitis: NEGATIVE
Comment: NEGATIVE
Comment: NEGATIVE
Comment: NEGATIVE

## 2021-11-04 ENCOUNTER — Encounter: Payer: Self-pay | Admitting: Family Medicine

## 2021-11-04 ENCOUNTER — Encounter: Payer: Self-pay | Admitting: Internal Medicine

## 2021-11-04 ENCOUNTER — Telehealth (INDEPENDENT_AMBULATORY_CARE_PROVIDER_SITE_OTHER): Payer: BC Managed Care – PPO | Admitting: Internal Medicine

## 2021-11-04 ENCOUNTER — Telehealth: Payer: Self-pay | Admitting: Family Medicine

## 2021-11-04 VITALS — Ht 64.0 in | Wt 145.0 lb

## 2021-11-04 DIAGNOSIS — U071 COVID-19: Secondary | ICD-10-CM

## 2021-11-04 MED ORDER — MOLNUPIRAVIR EUA 200MG CAPSULE: 4.0000 | ORAL_CAPSULE | Freq: Two times a day (BID) | ORAL | 0 refills | Status: AC

## 2021-11-04 NOTE — Telephone Encounter (Signed)
Pt has been added to Dr. Jerilee Hoh schedule for today
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
## 2021-11-04 NOTE — Telephone Encounter (Signed)
Pt has been added to Dr. Jerilee Hoh schedule for today.

## 2021-11-04 NOTE — Telephone Encounter (Signed)
Pt diagnosed with covid today, had symptoms for 2-3 days prior. seeking med for treatment.

## 2021-11-05 ENCOUNTER — Encounter: Payer: Self-pay | Admitting: Family Medicine

## 2021-11-05 MED ORDER — MAGIC MOUTHWASH: ORAL | 0 refills | Status: DC

## 2021-11-05 MED ORDER — MAGIC MOUTHWASH
ORAL | 0 refills | Status: DC
Start: 2021-11-05 — End: 2021-11-05

## 2021-11-05 NOTE — Addendum Note (Signed)
Addended by: Nilda Riggs on: 11/05/2021 12:50 PM   Modules accepted: Orders

## 2021-11-05 NOTE — Addendum Note (Signed)
Addended by: Westley Hummer B on: 11/05/2021 12:53 PM   Modules accepted: Orders

## 2021-11-06 ENCOUNTER — Telehealth: Payer: Self-pay | Admitting: Family Medicine

## 2021-11-06 DIAGNOSIS — Z20822 Contact with and (suspected) exposure to covid-19: Secondary | ICD-10-CM | POA: Diagnosis not present

## 2021-11-06 DIAGNOSIS — J029 Acute pharyngitis, unspecified: Secondary | ICD-10-CM | POA: Diagnosis not present

## 2021-11-06 DIAGNOSIS — U071 COVID-19: Secondary | ICD-10-CM | POA: Diagnosis not present

## 2021-11-06 DIAGNOSIS — Z03818 Encounter for observation for suspected exposure to other biological agents ruled out: Secondary | ICD-10-CM | POA: Diagnosis not present

## 2021-11-06 NOTE — Telephone Encounter (Signed)
[facility] triage nurse is calling she told pt to go to ER pt refuse. Pt was dx with covid 5 days ago and her throat is swollen she is unable to eat. Pt want virtual appt. Please advise

## 2021-11-06 NOTE — Telephone Encounter (Signed)
Please advise 

## 2021-11-06 NOTE — Telephone Encounter (Signed)
My chart message from 11/04/21, indicated that the prescription was faxed on yesterday,patient went to [facility] today to be seen due to difficulty swallowing.

## 2021-11-09 DIAGNOSIS — F4312 Post-traumatic stress disorder, chronic: Secondary | ICD-10-CM | POA: Diagnosis not present

## 2021-11-10 ENCOUNTER — Other Ambulatory Visit: Payer: Self-pay | Admitting: Family Medicine

## 2021-11-10 ENCOUNTER — Other Ambulatory Visit: Payer: Self-pay | Admitting: Obstetrics and Gynecology

## 2021-11-10 DIAGNOSIS — N3010 Interstitial cystitis (chronic) without hematuria: Secondary | ICD-10-CM

## 2021-11-12 ENCOUNTER — Ambulatory Visit: Payer: BC Managed Care – PPO | Admitting: Family Medicine

## 2021-11-13 DIAGNOSIS — F4312 Post-traumatic stress disorder, chronic: Secondary | ICD-10-CM | POA: Diagnosis not present

## 2021-11-16 DIAGNOSIS — F4312 Post-traumatic stress disorder, chronic: Secondary | ICD-10-CM | POA: Diagnosis not present

## 2021-11-20 DIAGNOSIS — F4312 Post-traumatic stress disorder, chronic: Secondary | ICD-10-CM | POA: Diagnosis not present

## 2021-11-24 ENCOUNTER — Other Ambulatory Visit: Payer: Self-pay | Admitting: Endocrinology

## 2021-11-24 ENCOUNTER — Ambulatory Visit: Payer: BC Managed Care – PPO

## 2021-11-24 DIAGNOSIS — E041 Nontoxic single thyroid nodule: Secondary | ICD-10-CM

## 2021-11-27 ENCOUNTER — Encounter: Payer: Self-pay | Admitting: Family Medicine

## 2021-11-27 ENCOUNTER — Ambulatory Visit: Payer: BC Managed Care – PPO | Admitting: Family Medicine

## 2021-11-27 VITALS — BP 110/70 | HR 70 | Temp 97.8°F | Ht 64.0 in | Wt 144.8 lb

## 2021-11-27 DIAGNOSIS — J029 Acute pharyngitis, unspecified: Secondary | ICD-10-CM

## 2021-11-27 DIAGNOSIS — F4312 Post-traumatic stress disorder, chronic: Secondary | ICD-10-CM | POA: Diagnosis not present

## 2021-11-27 DIAGNOSIS — J019 Acute sinusitis, unspecified: Secondary | ICD-10-CM | POA: Diagnosis not present

## 2021-11-27 MED ORDER — AMOXICILLIN-POT CLAVULANATE 875-125 MG PO TABS: 1.0000 | ORAL_TABLET | Freq: Two times a day (BID) | ORAL | 0 refills | Status: DC

## 2021-11-27 NOTE — Progress Notes (Signed)
Established Patient Office Visit  Subjective   Patient ID: Gabriela Shepard, female    DOB: 1960/08/27  Age: 61 y.o. MRN: [id number]  Chief Complaint  Patient presents with   Sore Throat    Patient complains of sore throat, x3 weeks    Cough    Patient complains of cough, x3 weeks    Sinusitis    Patient complains of sinusitis, x3 weeks    HPI   Gabriela Shepard had recent COVID infection about 3 and half weeks ago.  [facility].  Her sore throat pain was quite severe.  She did have some associated nasal congestion and cough and still has some cough at this time.  Her main complaint though is right-sided sore throat worse than left.  She has had some intermittent headaches and facial pressure and sinus pressure.  Frequently clearing her throat.  Intermittent hoarseness.  No further fever.  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   History of UTI    Interstitial cystitis    Low sodium levels    Migraines    Osteoporosis    PONV (postoperative nausea and vomiting)    PTSD (post-traumatic stress disorder)    on meds   STD (sexually transmitted disease)    Substance abuse ([facility])    hx of alcohol   Urine incontinence    Past Surgical History:  Procedure Laterality Date   BREAST BIOPSY Left    2017 benign x 2   CESAREAN SECTION  1992   COLONOSCOPY  2018   JMP-MAC-suprep(good)-polyps   INCONTINENCE SURGERY     LAPAROSCOPY     under bladder   NECK SURGERY  02/24/2018   ACDF   POLYPECTOMY  2018   polyps removed   TOTAL HIP ARTHROPLASTY Right 09/08/2020   TOTAL HIP ARTHROPLASTY Left 2018   TOTAL  KNEE ARTHROPLASTY Left 12/23/2015   Procedure: TOTAL KNEE ARTHROPLASTY;  Surgeon: Marcene Corning, MD;  Location: [facility];  Service: Orthopedics;  Laterality: Left;    reports that she quit smoking about 11 years ago. Her smoking use included cigarettes. She has a 5.00 pack-year smoking history. She has never used smokeless tobacco. She reports that she does not drink alcohol and does not use drugs. family history includes Alcohol abuse in an other family member; Alcoholism in her brother; Arthritis in an other family member; Drug abuse in her sister; Healthy in her daughter and son; Heart disease in her father; Hypertension in her maternal grandmother, mother, and another family member; Kidney cancer in her father; Lung cancer in her mother; Mental illness in an other family member; Ulcers in her father. Allergies  Allergen Reactions   Sulfa Antibiotics Hives    Review of Systems  Constitutional:  Negative for chills and fever.  HENT:  Positive for sinus pain and sore throat.   Respiratory:  Positive for cough. Negative for shortness of breath and wheezing.   Cardiovascular:  Negative for chest pain.  Objective:     BP 110/70 (BP Location: Left Arm, Patient Position: Sitting, Cuff Size: Normal)   Pulse 70   Temp 97.8 F (36.6 C) (Oral)   Ht 5\' 4"  (1.626 m)   Wt 144 lb 12.8 oz (65.7 kg)   LMP 06/07/2010 (Approximate)   SpO2 97%   BMI 24.85 kg/m    Physical Exam Vitals reviewed.  Constitutional:      Appearance: She is well-developed.  HENT:     Right Ear: Tympanic membrane normal.     Left Ear: Tympanic membrane normal.     Mouth/Throat:     Comments: Minimal erythema right anterior tonsillar fold.  No exudate.  No necrosis. Cardiovascular:     Rate and Rhythm: Normal rate and regular rhythm.  Pulmonary:     Effort: Pulmonary effort is normal.     Breath sounds: Normal breath sounds. No wheezing or rales.  Musculoskeletal:     Cervical back: Neck supple.   Lymphadenopathy:     Cervical: No cervical adenopathy.  Neurological:     Mental Status: She is alert.      No results found for any visits on 11/27/21.    The 10-year ASCVD risk score (Arnett DK, et al., 2019) is: 1.8%    Assessment & Plan:   Problem List Items Addressed This Visit   None Visit Diagnoses     Sore throat    -  Primary   Acute non-recurrent sinusitis, unspecified location       Relevant Medications   amoxicillin-clavulanate (AUGMENTIN) 875-125 MG tablet     Recent COVID infection.  She has some lingering cough but has had some sore throat symptoms and suspect possibly related to acute sinusitis with postnasal drip symptoms.  Recent rapid strep negative -Given duration of symptoms start Augmentin 875 mg twice daily with food for 10 days -Be in touch if sore throat symptoms not fully clearing following above  No follow-ups on file.    [name], MD

## 2021-11-30 DIAGNOSIS — F4312 Post-traumatic stress disorder, chronic: Secondary | ICD-10-CM | POA: Diagnosis not present

## 2021-12-03 DIAGNOSIS — F4312 Post-traumatic stress disorder, chronic: Secondary | ICD-10-CM | POA: Diagnosis not present

## 2021-12-04 ENCOUNTER — Ambulatory Visit: Payer: BC Managed Care – PPO | Admitting: Family Medicine

## 2021-12-07 ENCOUNTER — Other Ambulatory Visit: Payer: Self-pay | Admitting: Family Medicine

## 2021-12-11 DIAGNOSIS — F4312 Post-traumatic stress disorder, chronic: Secondary | ICD-10-CM | POA: Diagnosis not present

## 2021-12-14 ENCOUNTER — Encounter: Payer: Self-pay | Admitting: Family Medicine

## 2021-12-14 DIAGNOSIS — F4312 Post-traumatic stress disorder, chronic: Secondary | ICD-10-CM | POA: Diagnosis not present

## 2021-12-16 DIAGNOSIS — Z6826 Body mass index (BMI) 26.0-26.9, adult: Secondary | ICD-10-CM | POA: Diagnosis not present

## 2021-12-16 DIAGNOSIS — M47816 Spondylosis without myelopathy or radiculopathy, lumbar region: Secondary | ICD-10-CM | POA: Diagnosis not present

## 2021-12-16 DIAGNOSIS — M461 Sacroiliitis, not elsewhere classified: Secondary | ICD-10-CM | POA: Diagnosis not present

## 2021-12-17 DIAGNOSIS — F4312 Post-traumatic stress disorder, chronic: Secondary | ICD-10-CM | POA: Diagnosis not present

## 2021-12-18 ENCOUNTER — Encounter: Payer: Self-pay | Admitting: Family Medicine

## 2021-12-22 DIAGNOSIS — M47816 Spondylosis without myelopathy or radiculopathy, lumbar region: Secondary | ICD-10-CM | POA: Diagnosis not present

## 2021-12-22 DIAGNOSIS — M47812 Spondylosis without myelopathy or radiculopathy, cervical region: Secondary | ICD-10-CM | POA: Diagnosis not present

## 2021-12-22 DIAGNOSIS — F4312 Post-traumatic stress disorder, chronic: Secondary | ICD-10-CM | POA: Diagnosis not present

## 2021-12-25 DIAGNOSIS — F4312 Post-traumatic stress disorder, chronic: Secondary | ICD-10-CM | POA: Diagnosis not present

## 2021-12-28 ENCOUNTER — Ambulatory Visit: Payer: BC Managed Care – PPO | Admitting: Family Medicine

## 2021-12-28 ENCOUNTER — Encounter: Payer: Self-pay | Admitting: Family Medicine

## 2021-12-28 VITALS — BP 100/60 | HR 65 | Temp 98.0°F | Ht 64.0 in | Wt 144.5 lb

## 2021-12-28 DIAGNOSIS — F4312 Post-traumatic stress disorder, chronic: Secondary | ICD-10-CM | POA: Diagnosis not present

## 2021-12-28 DIAGNOSIS — R252 Cramp and spasm: Secondary | ICD-10-CM | POA: Diagnosis not present

## 2021-12-28 DIAGNOSIS — R5383 Other fatigue: Secondary | ICD-10-CM | POA: Diagnosis not present

## 2021-12-28 DIAGNOSIS — E871 Hypo-osmolality and hyponatremia: Secondary | ICD-10-CM

## 2021-12-28 DIAGNOSIS — R109 Unspecified abdominal pain: Secondary | ICD-10-CM

## 2021-12-28 DIAGNOSIS — T148XXA Other injury of unspecified body region, initial encounter: Secondary | ICD-10-CM

## 2021-12-28 LAB — CBC WITH DIFFERENTIAL/PLATELET
Basophils Absolute: 0 10*3/uL (ref 0.0–0.1)
Basophils Relative: 0.6 % (ref 0.0–3.0)
Eosinophils Absolute: 0.1 10*3/uL (ref 0.0–0.7)
Eosinophils Relative: 1.7 % (ref 0.0–5.0)
HCT: 38.8 % (ref 36.0–46.0)
Hemoglobin: 12.9 g/dL (ref 12.0–15.0)
Lymphocytes Relative: 27.1 % (ref 12.0–46.0)
Lymphs Abs: 2 10*3/uL (ref 0.7–4.0)
MCHC: 33.4 g/dL (ref 30.0–36.0)
MCV: 90.1 fl (ref 78.0–100.0)
Monocytes Absolute: 0.7 10*3/uL (ref 0.1–1.0)
Monocytes Relative: 8.6 % (ref 3.0–12.0)
Neutro Abs: 4.7 10*3/uL (ref 1.4–7.7)
Neutrophils Relative %: 62 % (ref 43.0–77.0)
Platelets: 338 10*3/uL (ref 150.0–400.0)
RBC: 4.31 Mil/uL (ref 3.87–5.11)
RDW: 13.8 % (ref 11.5–15.5)
WBC: 7.5 10*3/uL (ref 4.0–10.5)

## 2021-12-28 LAB — BASIC METABOLIC PANEL
BUN: 15 mg/dL (ref 6–23)
CO2: 27 mEq/L (ref 19–32)
Calcium: 8.8 mg/dL (ref 8.4–10.5)
Chloride: 94 mEq/L — ABNORMAL LOW (ref 96–112)
Creatinine, Ser: 0.76 mg/dL (ref 0.40–1.20)
GFR: 84.8 mL/min (ref >60.00)
Glucose, Bld: 91 mg/dL (ref 70–99)
Potassium: 4.2 mEq/L (ref 3.5–5.1)
Sodium: 129 mEq/L — ABNORMAL LOW (ref 135–145)

## 2021-12-28 LAB — TSH: TSH: 2 u[IU]/mL (ref 0.35–5.50)

## 2021-12-28 LAB — MAGNESIUM: Magnesium: 2.2 mg/dL (ref 1.5–2.5)

## 2021-12-28 MED ORDER — DICYCLOMINE HCL 10 MG PO CAPS: 10.0000 mg | ORAL_CAPSULE | Freq: Three times a day (TID) | ORAL | 0 refills | Status: AC

## 2021-12-28 NOTE — Progress Notes (Signed)
Established Patient Office Visit  Subjective   Patient ID: Gabriela Shepard, female    DOB: 06-07-1961  Age: 61 y.o. MRN: [id number]  Chief Complaint  Patient presents with   GI Problem    HPI   Gabriela Shepard is seen today with predominantly GI complaints.  She states she had a nerve ablation procedure her back recently and developed some constipation afterwards.  No stool incontinence.  She has longstanding history of IBS.  She has had issues with irregular bowel movements in the past.  She tried enema and eventually magnesium citrate and eventually developed some loose stools.  Her main complaint now is intermittent diffuse abdominal cramping.  She feels like that she has some likely "spasm ".  She still feels hungry.  No bloody stools.  Last colonoscopy was just a year ago.  She had some melanosis coli at that time and benign polyp.  She relates some easy bruising on her lower extremities, forearms, and a couple areas on her trunk.  Denies any gum bleeding, nosebleeds, hematuria, or hematochezia.  She is requesting CBC.  She has had some fatigue issues and intermittent nausea without vomiting.  History of hyponatremia and requesting repeat electrolytes.  No thiazide use.  She tries to be conscious to eat extra sodium.  She has had frequent foot cramps and wonders if her electrolytes or magnesium are off.  She takes B vitamins and tries to stay well-hydrated.  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   History of UTI    Interstitial cystitis    Low sodium levels    Migraines    Osteoporosis    PONV (postoperative nausea and vomiting)    PTSD (post-traumatic stress disorder)     on meds   STD (sexually transmitted disease)    Substance abuse ([place])    hx of alcohol   Urine incontinence    Past Surgical History:  Procedure Laterality Date   BREAST BIOPSY Left    2017 benign x 2   CESAREAN SECTION  1992   COLONOSCOPY  2018   JMP-MAC-suprep(good)-polyps   INCONTINENCE SURGERY     LAPAROSCOPY     under bladder   NECK SURGERY  02/24/2018   ACDF   POLYPECTOMY  2018   polyps removed   TOTAL HIP ARTHROPLASTY Right 09/08/2020   TOTAL HIP ARTHROPLASTY Left 2018   TOTAL KNEE ARTHROPLASTY Left 12/23/2015   Procedure: TOTAL KNEE ARTHROPLASTY;  Surgeon: Melrose Nakayama, MD;  Location: [facility];  Service: Orthopedics;  Laterality: Left;    reports that she quit smoking about 11 years ago. Her smoking use included cigarettes. She has a 5.00 pack-year smoking history. She has never used smokeless tobacco. She reports that she does not drink alcohol and does not use drugs. family history includes Alcohol abuse in an other family member; Alcoholism in her brother; Arthritis in an other family member; Drug abuse in her sister; Healthy in her daughter and son; Heart disease in her father; Hypertension in her maternal grandmother, mother, and another family member; Kidney cancer in  her father; Lung cancer in her mother; Mental illness in an other family member; Ulcers in her father. Allergies  Allergen Reactions   Sulfa Antibiotics Hives    Review of Systems  Constitutional:  Negative for chills, fever and weight loss.  Respiratory:  Negative for cough and shortness of breath.   Cardiovascular:  Negative for chest pain.  Gastrointestinal:  Positive for abdominal pain, constipation and nausea. Negative for blood in stool, melena and vomiting.  Genitourinary:  Negative for dysuria and hematuria.      Objective:     BP 100/60 (BP Location: Left Arm, Patient Position: Sitting, Cuff Size: Normal)   Pulse 65   Temp 98 F (36.7 C) (Oral)   Ht _0  (1.626 m)   Wt 144 lb 8  oz (65.5 kg)   LMP 06/07/2010 (Approximate)   SpO2 98%   BMI 24.80 kg/m    Physical Exam Vitals reviewed.  Constitutional:      Appearance: Normal appearance.  Cardiovascular:     Rate and Rhythm: Normal rate and regular rhythm.  Pulmonary:     Effort: Pulmonary effort is normal.     Breath sounds: Normal breath sounds. No wheezing or rales.  Abdominal:     Palpations: Abdomen is soft.     Comments: Mild tenderness epigastric region.  No guarding or rebound.  No hepatomegaly or splenomegaly noted.  Musculoskeletal:     Right lower leg: No edema.     Left lower leg: No edema.  Skin:    Comments: She does have several areas of bruising on her legs and forearms  Neurological:     Mental Status: She is alert.      No results found for any visits on 12/28/21.    The 10-year ASCVD risk score (Arnett DK, et al., 2019) is: 1.7%    Assessment & Plan:   Problem List Items Addressed This Visit       Unprioritized   Hyponatremia   Relevant Orders   Basic metabolic panel   Other Visit Diagnoses     Bruising    -  Primary   Relevant Orders   CBC with Differential/Platelet   Muscle cramps       Relevant Orders   Basic metabolic panel   Magnesium   Abdominal pain, unspecified abdominal location       Fatigue, unspecified type       Relevant Orders   TSH     -Check CBC with her multiple areas of bruising.  These do seem to be predominantly extremities and suspect benign.  No petechiae on exam.  -Recheck basic metabolic panel with her history of hyponatremia  -Check basic metabolic panel and magnesium level secondary to frequent muscle cramps.  -Intermittent abdominal spasms and cramps.  Discussed potential side effects of medication such as dicyclomine.  Cautious short-term trial of dicyclomine 10 mg every 8 hours as needed but watch for constipation  No follow-ups on file.    Carolann Littler, MD

## 2021-12-28 NOTE — Telephone Encounter (Signed)
I spoke with the pt's insurance and denial letter has been axed to our office. Letter has been attached to denial letter and sent to pt's insurance for approval or denial for appeal.

## 2022-01-01 DIAGNOSIS — F4312 Post-traumatic stress disorder, chronic: Secondary | ICD-10-CM | POA: Diagnosis not present

## 2022-01-04 ENCOUNTER — Encounter: Payer: Self-pay | Admitting: Family Medicine

## 2022-01-04 DIAGNOSIS — F4312 Post-traumatic stress disorder, chronic: Secondary | ICD-10-CM | POA: Diagnosis not present

## 2022-01-05 ENCOUNTER — Other Ambulatory Visit: Payer: BC Managed Care – PPO

## 2022-01-05 MED ORDER — CLOBETASOL PROPIONATE 0.05 % EX CREA: 1.0000 | TOPICAL_CREAM | Freq: Two times a day (BID) | CUTANEOUS | 0 refills | Status: AC

## 2022-01-05 NOTE — Telephone Encounter (Signed)
I sent in Clobetasol 0.05 % cream.

## 2022-01-06 ENCOUNTER — Ambulatory Visit
Admission: RE | Admit: 2022-01-06 | Discharge: 2022-01-06 | Disposition: A | Discharge status: Home / Self Care | Payer: BC Managed Care – PPO | Source: Ambulatory Visit | Attending: Endocrinology | Admitting: Endocrinology

## 2022-01-06 DIAGNOSIS — E041 Nontoxic single thyroid nodule: Secondary | ICD-10-CM | POA: Diagnosis not present

## 2022-01-08 DIAGNOSIS — F4312 Post-traumatic stress disorder, chronic: Secondary | ICD-10-CM | POA: Diagnosis not present

## 2022-01-11 ENCOUNTER — Other Ambulatory Visit: Payer: Self-pay | Admitting: Family Medicine

## 2022-01-11 DIAGNOSIS — F4312 Post-traumatic stress disorder, chronic: Secondary | ICD-10-CM | POA: Diagnosis not present

## 2022-01-15 DIAGNOSIS — F4312 Post-traumatic stress disorder, chronic: Secondary | ICD-10-CM | POA: Diagnosis not present

## 2022-01-18 DIAGNOSIS — F4312 Post-traumatic stress disorder, chronic: Secondary | ICD-10-CM | POA: Diagnosis not present

## 2022-01-20 DIAGNOSIS — E041 Nontoxic single thyroid nodule: Secondary | ICD-10-CM | POA: Diagnosis not present

## 2022-01-20 DIAGNOSIS — R7301 Impaired fasting glucose: Secondary | ICD-10-CM | POA: Diagnosis not present

## 2022-01-20 DIAGNOSIS — E871 Hypo-osmolality and hyponatremia: Secondary | ICD-10-CM | POA: Diagnosis not present

## 2022-01-20 DIAGNOSIS — M810 Age-related osteoporosis without current pathological fracture: Secondary | ICD-10-CM | POA: Diagnosis not present

## 2022-01-22 DIAGNOSIS — F4312 Post-traumatic stress disorder, chronic: Secondary | ICD-10-CM | POA: Diagnosis not present

## 2022-01-25 DIAGNOSIS — L281 Prurigo nodularis: Secondary | ICD-10-CM | POA: Diagnosis not present

## 2022-01-25 DIAGNOSIS — L821 Other seborrheic keratosis: Secondary | ICD-10-CM | POA: Diagnosis not present

## 2022-01-25 DIAGNOSIS — F4312 Post-traumatic stress disorder, chronic: Secondary | ICD-10-CM | POA: Diagnosis not present

## 2022-01-25 DIAGNOSIS — D1801 Hemangioma of skin and subcutaneous tissue: Secondary | ICD-10-CM | POA: Diagnosis not present

## 2022-01-25 DIAGNOSIS — Z789 Other specified health status: Secondary | ICD-10-CM | POA: Diagnosis not present

## 2022-01-25 DIAGNOSIS — L814 Other melanin hyperpigmentation: Secondary | ICD-10-CM | POA: Diagnosis not present

## 2022-01-25 DIAGNOSIS — L298 Other pruritus: Secondary | ICD-10-CM | POA: Diagnosis not present

## 2022-01-25 DIAGNOSIS — L820 Inflamed seborrheic keratosis: Secondary | ICD-10-CM | POA: Diagnosis not present

## 2022-01-26 DIAGNOSIS — M47812 Spondylosis without myelopathy or radiculopathy, cervical region: Secondary | ICD-10-CM | POA: Diagnosis not present

## 2022-01-31 ENCOUNTER — Other Ambulatory Visit: Payer: Self-pay | Admitting: Family Medicine

## 2022-02-11 DIAGNOSIS — M47812 Spondylosis without myelopathy or radiculopathy, cervical region: Secondary | ICD-10-CM | POA: Diagnosis not present

## 2022-02-17 ENCOUNTER — Encounter: Payer: Self-pay | Admitting: Family Medicine

## 2022-02-17 NOTE — Telephone Encounter (Signed)
Okay to refill lidocaine 3%-hydrocortisone 0.5% cream   -apply to affected area every 6 hours as needed  Not sure if this was compounded by pharmacy.  We may have to call her pharmacy that she got this from to see if they have a record regarding this.  Okay to refill with 1 additional refill.  Eulas Post MD [facility]

## 2022-02-17 NOTE — Telephone Encounter (Signed)
I spoke with the patient's pharmacy and rx has been called in verbally as unable to send electronically.

## 2022-02-19 ENCOUNTER — Other Ambulatory Visit: Payer: Self-pay | Admitting: Family Medicine

## 2022-03-10 ENCOUNTER — Ambulatory Visit: Payer: BC Managed Care – PPO | Attending: Obstetrics and Gynecology | Admitting: Physical Therapy

## 2022-03-10 ENCOUNTER — Other Ambulatory Visit: Payer: Self-pay

## 2022-03-10 DIAGNOSIS — R279 Unspecified lack of coordination: Secondary | ICD-10-CM | POA: Insufficient documentation

## 2022-03-10 DIAGNOSIS — M6281 Muscle weakness (generalized): Secondary | ICD-10-CM | POA: Diagnosis not present

## 2022-03-10 DIAGNOSIS — R293 Abnormal posture: Secondary | ICD-10-CM | POA: Insufficient documentation

## 2022-03-10 DIAGNOSIS — M62838 Other muscle spasm: Secondary | ICD-10-CM | POA: Diagnosis not present

## 2022-03-10 NOTE — Patient Instructions (Signed)
Moisturizers They are used in the vagina to hydrate the mucous membrane that make up the vaginal canal. Designed to keep a more normal acid balance (ph) Once placed in the vagina, it will last between two to three days.  Use 2-3 times per week at bedtime  Ingredients to avoid is glycerin and fragrance, can increase chance of infection Should not be used just before sex due to causing irritation Most are gels administered either in a tampon-shaped applicator or as a vaginal suppository. They are non-hormonal.   Types of Moisturizers(internal use)  Vitamin E vaginal suppositories- Whole foods, Amazon Moist Again Coconut oil- can break down condoms Julva- (Do no use if on Tamoxifen) amazon Yes moisturizer- amazon NeuEve Silk , NeuEve Silver for menopausal or over 65 (if have severe vaginal atrophy or cancer treatments use NeuEve Silk for  1 month than move to NeuEve Silver)- Amazon, Neuve.com Olive and Bee intimate cream- www.oliveandbee.com.au Mae vaginal moisturizer- Amazon Aloe Good Clean Love Hyaluronic acid Hyalofemme replens   Creams to use externally on the Vulva area Desert Harvest Releveum (good for for cancer patients that had radiation to the area)- amazon or www.desertharvest.com V-magic cream - amazon Julva-amazon Vital "V Wild Yam salve ( help moisturize and help with thinning vulvar area, does have Beeswax MoodMaid Botanical Pro-Meno Wild Yam Cream- Amazon Desert Harvest Gele Cleo by Damiva labial moisturizer (Amazon,  Coconut or olive oil aloe Good Clean Love Enchanted Rose by intimate rose  Things to avoid in the vaginal area Do not use things to irritate the vulvar area No lotions just specialized creams for the vulva area- Neogyn, V-magic, No soaps; can use Aveeno or Calendula cleanser if needed. Must be gentle No deodorants No douches Good to sleep without underwear to let the vaginal area to air out No scrubbing: spread the lips to let warm water rinse  over labias and pat dry  
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
## 2022-03-13 ENCOUNTER — Other Ambulatory Visit: Payer: Self-pay | Admitting: Family Medicine

## 2022-03-16 ENCOUNTER — Ambulatory Visit: Payer: BC Managed Care – PPO | Admitting: Physical Therapy

## 2022-03-16 DIAGNOSIS — R279 Unspecified lack of coordination: Secondary | ICD-10-CM

## 2022-03-16 DIAGNOSIS — M6281 Muscle weakness (generalized): Secondary | ICD-10-CM

## 2022-03-16 DIAGNOSIS — M62838 Other muscle spasm: Secondary | ICD-10-CM | POA: Diagnosis not present

## 2022-03-16 DIAGNOSIS — R293 Abnormal posture: Secondary | ICD-10-CM | POA: Diagnosis not present
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
## 2022-03-17 DIAGNOSIS — F4312 Post-traumatic stress disorder, chronic: Secondary | ICD-10-CM | POA: Diagnosis not present

## 2022-03-18 DIAGNOSIS — F4312 Post-traumatic stress disorder, chronic: Secondary | ICD-10-CM | POA: Diagnosis not present

## 2022-03-22 ENCOUNTER — Other Ambulatory Visit: Payer: Self-pay

## 2022-03-22 ENCOUNTER — Encounter: Payer: Self-pay | Admitting: Family Medicine

## 2022-03-22 MED ORDER — MONTELUKAST SODIUM 10 MG PO TABS: 10.0000 mg | ORAL_TABLET | Freq: Every day | ORAL | 3 refills | Status: AC

## 2022-03-29 ENCOUNTER — Encounter: Payer: Self-pay | Admitting: *Deleted

## 2022-03-31 ENCOUNTER — Ambulatory Visit: Payer: BC Managed Care – PPO | Admitting: Physical Therapy

## 2022-03-31 DIAGNOSIS — M47812 Spondylosis without myelopathy or radiculopathy, cervical region: Secondary | ICD-10-CM | POA: Diagnosis not present

## 2022-04-01 ENCOUNTER — Encounter: Payer: Self-pay | Admitting: Family Medicine

## 2022-04-01 ENCOUNTER — Encounter: Payer: BC Managed Care – PPO | Admitting: Physical Therapy

## 2022-04-01 ENCOUNTER — Other Ambulatory Visit: Payer: Self-pay

## 2022-04-01 MED ORDER — VALACYCLOVIR HCL 500 MG PO TABS: ORAL_TABLET | ORAL | 2 refills | Status: DC

## 2022-04-08 ENCOUNTER — Ambulatory Visit: Payer: BC Managed Care – PPO | Admitting: Physical Therapy

## 2022-04-13 ENCOUNTER — Telehealth: Payer: Self-pay

## 2022-04-13 NOTE — Patient Outreach (Signed)
  Care Coordination   04/13/2022 Name: Gabriela Shepard MRN: [id number] DOB: [birth year]   Care Coordination Outreach Attempts:  An unsuccessful telephone outreach was attempted today to offer the patient information about available care coordination services as a benefit of their health plan.   Follow Up Plan:  Additional outreach attempts will be made to offer the patient care coordination information and services.   Encounter Outcome:  No Answer  Care Coordination Interventions Activated:  No   Care Coordination Interventions:  No, not indicated    Enzo Montgomery, RN,BSN,CCM [facility] Telephonic Care Management Coordinator Direct Phone: [phone] Toll Free: [phone] Fax: [phone]

## 2022-04-15 ENCOUNTER — Ambulatory Visit: Payer: BC Managed Care – PPO | Admitting: Physical Therapy

## 2022-04-19 ENCOUNTER — Encounter: Payer: Self-pay | Admitting: Family Medicine

## 2022-04-19 ENCOUNTER — Ambulatory Visit: Payer: BC Managed Care – PPO | Admitting: Family Medicine

## 2022-04-19 VITALS — BP 120/80 | HR 77 | Temp 98.7°F | Ht 64.0 in | Wt 154.0 lb

## 2022-04-19 DIAGNOSIS — K219 Gastro-esophageal reflux disease without esophagitis: Secondary | ICD-10-CM

## 2022-04-19 DIAGNOSIS — Z23 Encounter for immunization: Secondary | ICD-10-CM | POA: Diagnosis not present

## 2022-04-19 DIAGNOSIS — H6991 Unspecified Eustachian tube disorder, right ear: Secondary | ICD-10-CM | POA: Diagnosis not present

## 2022-04-19 MED ORDER — FLUTICASONE PROPIONATE 50 MCG/ACT NA SUSP: 2.0000 | Freq: Every day | NASAL | 11 refills | Status: AC

## 2022-04-19 MED ORDER — ESOMEPRAZOLE MAGNESIUM 40 MG PO CPDR: 40.0000 mg | DELAYED_RELEASE_CAPSULE | Freq: Every day | ORAL | 5 refills | Status: DC

## 2022-04-19 NOTE — Progress Notes (Signed)
Established Patient Office Visit  Subjective   Patient ID: Gabriela Shepard, female    DOB: 05/29/1961  Age: 61 y.o. MRN: [id number]  Chief Complaint  Patient presents with   Ear Fullness    HPI   Kaleigh is seen with sensation of possible fluid behind her ears right greater than left past couple of months.  Does have some frequent nasal congestion.  She is currently on allergy medication with Singulair.  Not using Flonase regularly.  Denies any recent flying.  Denies any loss of hearing or vertigo.  No recent tinnitus.  Also requesting refill of Nexium which she uses intermittently for GERD symptoms.  No dysphagia.  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   History of UTI    Interstitial cystitis    Low sodium levels    Migraines    Osteoporosis    PONV (postoperative nausea and vomiting)    PTSD (post-traumatic stress disorder)    on meds   STD (sexually transmitted disease)    Substance abuse ([place])    hx of alcohol   Urine incontinence    Past Surgical History:  Procedure Laterality Date   BREAST BIOPSY Left    2017 benign x 2   CESAREAN SECTION  1992   COLONOSCOPY  2018   JMP-MAC-suprep(good)-polyps   INCONTINENCE SURGERY     LAPAROSCOPY     under bladder   NECK SURGERY  02/24/2018   ACDF   POLYPECTOMY  2018   polyps removed   TOTAL HIP ARTHROPLASTY Right 09/08/2020   TOTAL HIP ARTHROPLASTY Left 2018   TOTAL KNEE ARTHROPLASTY Left 12/23/2015   Procedure: TOTAL KNEE ARTHROPLASTY;  Surgeon: Melrose Nakayama, MD;  Location: [facility];  Service: Orthopedics;  Laterality: Left;    reports that she quit smoking about 11 years ago. Her smoking use  included cigarettes. She has a 5.00 pack-year smoking history. She has never used smokeless tobacco. She reports that she does not drink alcohol and does not use drugs. family history includes Alcohol abuse in an other family member; Alcoholism in her brother; Arthritis in an other family member; Drug abuse in her sister; Healthy in her daughter and son; Heart disease in her father; Hypertension in her maternal grandmother, mother, and another family member; Kidney cancer in her father; Lung cancer in her mother; Mental illness in an other family member; Ulcers in her father. Allergies  Allergen Reactions   Sulfa Antibiotics Hives   Alendronate Other (See Comments)    Review of Systems  Constitutional:  Negative for chills and fever.  HENT:  Positive for congestion. Negative for ear discharge, ear pain, hearing loss, sinus pain and tinnitus.   Respiratory:  Negative for cough.       Objective:     BP 120/80   Pulse 77   Temp 98.7 F (37.1 C) (Oral)   Ht _0  (1.626 m)   Wt 154 lb (69.9 kg)   LMP 06/07/2010 (Approximate)   SpO2 96%   BMI 26.43 kg/m    Physical Exam Vitals  reviewed.  Constitutional:      General: She is not in acute distress.    Appearance: Normal appearance.  HENT:     Right Ear: Tympanic membrane and ear canal normal.     Left Ear: Tympanic membrane and ear canal normal.  Cardiovascular:     Rate and Rhythm: Normal rate and regular rhythm.  Pulmonary:     Effort: Pulmonary effort is normal.     Breath sounds: Normal breath sounds.  Musculoskeletal:     Cervical back: Neck supple.  Lymphadenopathy:     Cervical: No cervical adenopathy.  Neurological:     Mental Status: She is alert.      No results found for any visits on 04/19/22.    The 10-year ASCVD risk score (Arnett DK, et al., 2019) is: 2.5%    Assessment & Plan:   #1 bilateral ear symptoms right greater than left.  Ear drums look entirely normal.  No obvious effusion.  No erythema.   Question eustachian tube dysfunction.  She does have frequent allergic rhinitis symptoms.  Continue Singulair and recommend adding back Flonase 2 sprays per nostril once daily with refill given  #2 intermittent GERD symptoms.  Refilled Nexium for as needed use.  Try to avoid regular use of PPI.   No follow-ups on file.    Carolann Littler, MD

## 2022-04-20 ENCOUNTER — Telehealth: Payer: Self-pay

## 2022-04-20 NOTE — Patient Outreach (Signed)
  Care Coordination   04/20/2022 Name: HOORAIN KOZAKIEWICZ MRN: [id number] DOB: Nov 11, 1960   Care Coordination Outreach Attempts:  A second unsuccessful outreach was attempted today to offer the patient with information about available care coordination services as a benefit of their health plan.     Follow Up Plan:  Additional outreach attempts will be made to offer the patient care coordination information and services.   Encounter Outcome:  No Answer  Care Coordination Interventions Activated:  No   Care Coordination Interventions:  No, not indicated    Enzo Montgomery, RN,BSN,CCM [facility] Telephonic Care Management Coordinator Direct Phone: [phone] Toll Free: [phone] Fax: [phone]

## 2022-04-21 ENCOUNTER — Ambulatory Visit: Payer: BC Managed Care – PPO | Attending: Obstetrics and Gynecology | Admitting: Physical Therapy

## 2022-04-21 DIAGNOSIS — M6281 Muscle weakness (generalized): Secondary | ICD-10-CM | POA: Insufficient documentation

## 2022-04-21 DIAGNOSIS — R279 Unspecified lack of coordination: Secondary | ICD-10-CM | POA: Diagnosis not present
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
## 2022-04-24 ENCOUNTER — Other Ambulatory Visit: Payer: Self-pay | Admitting: Family Medicine

## 2022-04-27 ENCOUNTER — Encounter: Payer: BC Managed Care – PPO | Admitting: Physical Therapy

## 2022-05-03 ENCOUNTER — Other Ambulatory Visit: Payer: Self-pay | Admitting: Family Medicine

## 2022-05-04 ENCOUNTER — Encounter: Payer: BC Managed Care – PPO | Admitting: Physical Therapy

## 2022-05-12 ENCOUNTER — Telehealth: Payer: Self-pay

## 2022-05-12 NOTE — Patient Outreach (Signed)
  Care Coordination   05/12/2022 Name: Gabriela Shepard MRN: [id number] DOB: 09-13-1960   Care Coordination Outreach Attempts:  A third unsuccessful outreach was attempted today to offer the patient with information about available care coordination services as a benefit of their health plan.   Follow Up Plan:  No further outreach attempts will be made at this time. We have been unable to contact the patient to offer or enroll patient in care coordination services  Encounter Outcome:  No Answer   Care Coordination Interventions:  No, not indicated    Enzo Montgomery, RN,BSN,CCM [facility] Telephonic Care Management Coordinator Direct Phone: [phone] Toll Free: [phone] Fax: [phone]

## 2022-05-13 ENCOUNTER — Ambulatory Visit: Payer: BC Managed Care – PPO | Admitting: Physical Therapy

## 2022-05-14 ENCOUNTER — Ambulatory Visit: Payer: BC Managed Care – PPO | Admitting: Family Medicine

## 2022-05-14 ENCOUNTER — Encounter: Payer: Self-pay | Admitting: Family Medicine

## 2022-05-14 VITALS — BP 104/70 | HR 82 | Temp 98.3°F | Ht 64.0 in | Wt 156.7 lb

## 2022-05-14 DIAGNOSIS — J0191 Acute recurrent sinusitis, unspecified: Secondary | ICD-10-CM

## 2022-05-14 DIAGNOSIS — E871 Hypo-osmolality and hyponatremia: Secondary | ICD-10-CM

## 2022-05-14 LAB — BASIC METABOLIC PANEL
BUN: 12 mg/dL (ref 6–23)
CO2: 25 mEq/L (ref 19–32)
Calcium: 9.3 mg/dL (ref 8.4–10.5)
Chloride: 96 mEq/L (ref 96–112)
Creatinine, Ser: 0.69 mg/dL (ref 0.40–1.20)
GFR: 93.67 mL/min (ref >60.00)
Glucose, Bld: 97 mg/dL (ref 70–99)
Potassium: 4.4 mEq/L (ref 3.5–5.1)
Sodium: 128 mEq/L — ABNORMAL LOW (ref 135–145)

## 2022-05-14 MED ORDER — AMOXICILLIN-POT CLAVULANATE 875-125 MG PO TABS: 1.0000 | ORAL_TABLET | Freq: Two times a day (BID) | ORAL | 0 refills | Status: DC

## 2022-05-14 NOTE — Progress Notes (Signed)
Established Patient Office Visit  Subjective   Patient ID: Gabriela Shepard, female    DOB: 1961-03-21  Age: 61 y.o. MRN: [id number]  Chief Complaint  Patient presents with   Sinusitis    Patient complains of sinusitis, x3 weeks,    Sore Throat    Patient complains of sore throat, x1 day,    Cough    Patient complains of cough, Non productive, Tried Advil with little relief    HPI   Keir is seen with upper respiratory symptoms.  She states for few weeks now she has had some intermittent sinusitis symptoms.  She has had frequent sinusitis in the past.  Started having worse sore throat yesterday.  She had some cough intermittently.  Frequent sinus headaches and sinus congestion and facial pain.  She is requesting follow-up basic metabolic panel.  She had some recent malaise.  Has had hyponatremia in the past.  Her hyponatremia preceded going on Lexapro.  No thiazide use.  She tries to stay conscious of getting adequate sodium.  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   History of UTI    Interstitial cystitis    Low sodium levels    Migraines    Osteoporosis    PONV (postoperative nausea and vomiting)    PTSD (post-traumatic stress disorder)    on meds   STD (sexually transmitted disease)    Substance abuse ([place])    hx of alcohol   Urine incontinence    Past Surgical History:  Procedure Laterality Date   BREAST BIOPSY Left    2017 benign x 2   CESAREAN SECTION  1992   COLONOSCOPY  2018   JMP-MAC-suprep(good)-polyps   INCONTINENCE SURGERY     LAPAROSCOPY     under bladder   NECK SURGERY  02/24/2018   ACDF   POLYPECTOMY  2018   polyps removed    TOTAL HIP ARTHROPLASTY Right 09/08/2020   TOTAL HIP ARTHROPLASTY Left 2018   TOTAL KNEE ARTHROPLASTY Left 12/23/2015   Procedure: TOTAL KNEE ARTHROPLASTY;  Surgeon: Melrose Nakayama, MD;  Location: [facility];  Service: Orthopedics;  Laterality: Left;    reports that she quit smoking about 11 years ago. Her smoking use included cigarettes. She has a 5.00 pack-year smoking history. She has never used smokeless tobacco. She reports that she does not drink alcohol and does not use drugs. family history includes Alcohol abuse in an other family member; Alcoholism in her brother; Arthritis in an other family member; Drug abuse in her sister; Healthy in her daughter and son; Heart disease in her father; Hypertension in her maternal grandmother, mother, and another family member; Kidney cancer in her father; Lung cancer in her mother; Mental illness in an other family member; Ulcers in her father. Allergies  Allergen Reactions   Sulfa Antibiotics Hives   Alendronate Other (See Comments)    Review of Systems  Constitutional:  Negative for chills and fever.  HENT:  Positive for congestion and sinus pain.   Cardiovascular:  Negative for chest pain.  Neurological:  Positive  for headaches.      Objective:     BP 104/70 (BP Location: Left Arm, Patient Position: Sitting, Cuff Size: Normal)   Pulse 82   Temp 98.3 F (36.8 C) (Oral)   Ht _0  (1.626 m)   Wt 156 lb 11.2 oz (71.1 kg)   LMP 06/07/2010 (Approximate)   SpO2 96%   BMI 26.90 kg/m    Physical Exam Vitals reviewed.  Constitutional:      General: She is not in acute distress.    Appearance: She is well-developed.  HENT:     Right Ear: Tympanic membrane normal.     Left Ear: Tympanic membrane normal.     Mouth/Throat:     Mouth: Mucous membranes are moist.     Pharynx: Oropharynx is clear.  Cardiovascular:     Rate and Rhythm: Normal rate and regular rhythm.  Pulmonary:     Effort: Pulmonary effort is normal.     Breath sounds:  Normal breath sounds. No wheezing or rales.  Neurological:     Mental Status: She is alert.      No results found for any visits on 05/14/22.    The 10-year ASCVD risk score (Arnett DK, et al., 2019) is: 1.9%    Assessment & Plan:   #1 recurrent sinusitis.  She has been battling symptoms for weeks.  We we will go ahead and start Augmentin 875 mg twice daily for 10 days.  Stay well-hydrated.  Follow-up for any persistent or worsening symptoms  #2 history of mild hyponatremia.  Patient requesting follow-up electrolytes.  Will check basic metabolic panel.  She does not take any thiazides.  Her hyponatremia preceded starting SSRI   No follow-ups on file.    Carolann Littler, MD

## 2022-05-19 ENCOUNTER — Telehealth: Payer: Self-pay | Admitting: Family Medicine

## 2022-05-19 ENCOUNTER — Encounter: Payer: Self-pay | Admitting: Family Medicine

## 2022-05-19 NOTE — Telephone Encounter (Signed)
Treated last Friday, still experiencing symptoms. Requesting a doctors note stating she is too sick to travel and asks that it is loaded to her mychart.

## 2022-05-20 ENCOUNTER — Encounter: Payer: BC Managed Care – PPO | Admitting: Physical Therapy

## 2022-05-21 NOTE — Telephone Encounter (Signed)
Noted  

## 2022-05-25 ENCOUNTER — Encounter: Payer: Self-pay | Admitting: Family Medicine

## 2022-05-26 MED ORDER — LEVOFLOXACIN 500 MG PO TABS: 500.0000 mg | ORAL_TABLET | Freq: Every day | ORAL | 0 refills | Status: AC

## 2022-05-26 NOTE — Telephone Encounter (Signed)
May 26, 2022 Eulas Post, MD  to [facility]     05/26/22  1:03 PM Send in Levaquin 500 milligrams by mouth once daily for 7 days

## 2022-06-01 ENCOUNTER — Other Ambulatory Visit: Payer: Self-pay | Admitting: Family Medicine

## 2022-06-02 ENCOUNTER — Encounter: Payer: Self-pay | Admitting: Family Medicine

## 2022-06-03 MED ORDER — CLONAZEPAM 1 MG PO TABS: ORAL_TABLET | ORAL | 2 refills | Status: DC

## 2022-06-03 NOTE — Telephone Encounter (Signed)
Refills sent.  Eulas Post MD [facility] at [facility]

## 2022-06-03 NOTE — Telephone Encounter (Signed)
Last OV 12/8/223 for acute reasons  Medication was last refilled on 03/15/22 per [facility].

## 2022-06-08 DIAGNOSIS — F4310 Post-traumatic stress disorder, unspecified: Secondary | ICD-10-CM | POA: Diagnosis not present

## 2022-06-09 ENCOUNTER — Encounter: Payer: Self-pay | Admitting: Family Medicine

## 2022-06-09 MED ORDER — LEVOFLOXACIN 500 MG PO TABS: 500.0000 mg | ORAL_TABLET | Freq: Every day | ORAL | 0 refills | Status: AC

## 2022-06-10 ENCOUNTER — Ambulatory Visit: Payer: BC Managed Care – PPO | Attending: Obstetrics and Gynecology | Admitting: Physical Therapy

## 2022-06-10 DIAGNOSIS — M6281 Muscle weakness (generalized): Secondary | ICD-10-CM | POA: Insufficient documentation

## 2022-06-10 DIAGNOSIS — R279 Unspecified lack of coordination: Secondary | ICD-10-CM | POA: Diagnosis not present
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
## 2022-06-13 ENCOUNTER — Other Ambulatory Visit: Payer: Self-pay | Admitting: Family Medicine

## 2022-06-14 DIAGNOSIS — F4310 Post-traumatic stress disorder, unspecified: Secondary | ICD-10-CM | POA: Diagnosis not present

## 2022-06-14 NOTE — Telephone Encounter (Signed)
Rx done. 

## 2022-06-21 DIAGNOSIS — F4310 Post-traumatic stress disorder, unspecified: Secondary | ICD-10-CM | POA: Diagnosis not present

## 2022-06-23 ENCOUNTER — Encounter: Payer: BC Managed Care – PPO | Admitting: Family Medicine

## 2022-06-26 ENCOUNTER — Other Ambulatory Visit: Payer: Self-pay | Admitting: Family Medicine

## 2022-06-28 DIAGNOSIS — F4310 Post-traumatic stress disorder, unspecified: Secondary | ICD-10-CM | POA: Diagnosis not present

## 2022-06-29 DIAGNOSIS — F4312 Post-traumatic stress disorder, chronic: Secondary | ICD-10-CM | POA: Diagnosis not present

## 2022-06-29 DIAGNOSIS — M47816 Spondylosis without myelopathy or radiculopathy, lumbar region: Secondary | ICD-10-CM | POA: Diagnosis not present

## 2022-06-29 DIAGNOSIS — F339 Major depressive disorder, recurrent, unspecified: Secondary | ICD-10-CM | POA: Diagnosis not present

## 2022-06-30 ENCOUNTER — Ambulatory Visit (INDEPENDENT_AMBULATORY_CARE_PROVIDER_SITE_OTHER): Payer: BC Managed Care – PPO | Admitting: Family Medicine

## 2022-06-30 ENCOUNTER — Encounter: Payer: Self-pay | Admitting: Family Medicine

## 2022-06-30 VITALS — BP 106/70 | HR 79 | Temp 97.7°F | Ht 64.17 in | Wt 163.0 lb

## 2022-06-30 DIAGNOSIS — Z0000 Encounter for general adult medical examination without abnormal findings: Secondary | ICD-10-CM | POA: Diagnosis not present

## 2022-06-30 LAB — CBC WITH DIFFERENTIAL/PLATELET
Basophils Absolute: 0 10*3/uL (ref 0.0–0.1)
Basophils Relative: 0.4 % (ref 0.0–3.0)
Eosinophils Absolute: 0 10*3/uL (ref 0.0–0.7)
Eosinophils Relative: 0 % (ref 0.0–5.0)
HCT: 39.3 % (ref 36.0–46.0)
Hemoglobin: 13.3 g/dL (ref 12.0–15.0)
Lymphocytes Relative: 12.6 % (ref 12.0–46.0)
Lymphs Abs: 0.8 10*3/uL (ref 0.7–4.0)
MCHC: 33.8 g/dL (ref 30.0–36.0)
MCV: 89.4 fl (ref 78.0–100.0)
Monocytes Absolute: 0.2 10*3/uL (ref 0.1–1.0)
Monocytes Relative: 3 % (ref 3.0–12.0)
Neutro Abs: 5.5 10*3/uL (ref 1.4–7.7)
Neutrophils Relative %: 84 % — ABNORMAL HIGH (ref 43.0–77.0)
Platelets: 364 10*3/uL (ref 150.0–400.0)
RBC: 4.39 Mil/uL (ref 3.87–5.11)
RDW: 13.3 % (ref 11.5–15.5)
WBC: 6.5 10*3/uL (ref 4.0–10.5)

## 2022-06-30 LAB — LIPID PANEL
Cholesterol: 196 mg/dL (ref 0–200)
HDL: 70.1 mg/dL (ref >39.00)
LDL Cholesterol: 116 mg/dL — ABNORMAL HIGH (ref 0–99)
NonHDL: 126.25
Total CHOL/HDL Ratio: 3
Triglycerides: 53 mg/dL (ref 0.0–149.0)
VLDL: 10.6 mg/dL (ref 0.0–40.0)

## 2022-06-30 LAB — BASIC METABOLIC PANEL
BUN: 18 mg/dL (ref 6–23)
CO2: 24 mEq/L (ref 19–32)
Calcium: 9.5 mg/dL (ref 8.4–10.5)
Chloride: 95 mEq/L — ABNORMAL LOW (ref 96–112)
Creatinine, Ser: 0.78 mg/dL (ref 0.40–1.20)
GFR: 81.91 mL/min (ref >60.00)
Glucose, Bld: 148 mg/dL — ABNORMAL HIGH (ref 70–99)
Potassium: 4.4 mEq/L (ref 3.5–5.1)
Sodium: 129 mEq/L — ABNORMAL LOW (ref 135–145)

## 2022-06-30 LAB — HEPATIC FUNCTION PANEL
ALT: 15 U/L (ref 0–35)
AST: 21 U/L (ref 0–37)
Albumin: 4.5 g/dL (ref 3.5–5.2)
Alkaline Phosphatase: 43 U/L (ref 39–117)
Bilirubin, Direct: 0 mg/dL (ref 0.0–0.3)
Total Bilirubin: 0.3 mg/dL (ref 0.2–1.2)
Total Protein: 7.6 g/dL (ref 6.0–8.3)

## 2022-06-30 LAB — HEMOGLOBIN A1C: Hgb A1c MFr Bld: 5.8 % (ref 4.6–6.5)

## 2022-06-30 MED ORDER — VITAMIN D3 1.25 MG (50000 UT) PO CAPS: 50000.0000 [IU] | ORAL_CAPSULE | ORAL | 3 refills | Status: DC

## 2022-06-30 MED ORDER — IBUPROFEN 800 MG PO TABS: 800.0000 mg | ORAL_TABLET | Freq: Three times a day (TID) | ORAL | 1 refills | Status: DC | PRN

## 2022-06-30 MED ORDER — CLOBETASOL PROPIONATE 0.05 % EX SHAM: 1.0000 | MEDICATED_SHAMPOO | Freq: Every day | CUTANEOUS | 0 refills | Status: AC | PRN

## 2022-06-30 NOTE — Telephone Encounter (Signed)
I sent in Rx for Clobetasol shampoo.   Unfortunately, all prescription shampoos tend to be pricey.    Eulas Post MD [facility] at [facility]

## 2022-06-30 NOTE — Progress Notes (Signed)
Established Patient Office Visit  Subjective   Patient ID: Gabriela Shepard, female    DOB: 09/21/1960  Age: 62 y.o. MRN: [id number]  Chief Complaint  Patient presents with   Annual Exam    HPI   Gabriela Shepard is seen for physical exam/wellness visit.  She still sees GYN.  Her mammogram and Pap smear are up-to-date.  She has history of migraine headaches, IBS, chronic back pain with cervical and lumbar radiculitis, osteoarthritis, remote history of alcohol abuse, history of PTSD.  She had some ongoing sinus issues.  She especially having persistent pain right frontal and maxillary sinus.  She has occasional bloody drainage.  Increased fatigue.  Frequent almost daily headaches.  Has been on multiple antibiotic courses without resolution.  We have previously discussed imaging to further assess.  She continues to get regular counseling regarding her PTSD.  She has been abstinent from alcohol now since 2012 and she also quit smoking the same time.  Health maintenance reviewed.  All basically up-to-date.  Immunizations up-to-date.  Family history-father is deceased.  He had history of kidney cancer and also history of alcohol abuse and apparently coronary disease around age 26.  Her mother is alive with history of hypertension.  She had a sister that died of drug overdose.  She has a brother who has alcoholism.  Social history-she is divorced.  She has a grown daughter and son.  No grandchildren.  Quit smoking and quit alcohol use 2012.  Works full-time.  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   History of UTI    Interstitial cystitis    Low sodium levels    Migraines     Osteoporosis    PONV (postoperative nausea and vomiting)    PTSD (post-traumatic stress disorder)    on meds   STD (sexually transmitted disease)    Substance abuse ([place])    hx of alcohol   Urine incontinence    Past Surgical History:  Procedure Laterality Date   BREAST BIOPSY Left    2017 benign x 2   CESAREAN SECTION  1992   COLONOSCOPY  2018   JMP-MAC-suprep(good)-polyps   INCONTINENCE SURGERY     LAPAROSCOPY     under bladder   NECK SURGERY  02/24/2018   ACDF   POLYPECTOMY  2018   polyps removed   TOTAL HIP ARTHROPLASTY Right 09/08/2020   TOTAL HIP ARTHROPLASTY Left 2018   TOTAL KNEE ARTHROPLASTY Left 12/23/2015   Procedure: TOTAL KNEE ARTHROPLASTY;  Surgeon: Melrose Nakayama, MD;  Location: [place];  Service: Orthopedics;  Laterality: Left;    reports that she quit smoking about 11 years ago. Her smoking use included cigarettes. She has a 5.00 pack-year smoking history. She has never used smokeless tobacco. She reports that she does not drink alcohol and does not use drugs. family history includes Alcohol abuse in her brother and another family member; Alcoholism in her brother; Arthritis in an other family member; Cancer (age of onset: 68) in her father; Drug abuse in her sister; Healthy in her daughter and  son; Heart disease in her father; Hypertension in her maternal grandmother, mother, and another family member; Kidney cancer in her father; Lung cancer in her mother; Mental illness in an other family member; Ulcers in her father. Allergies  Allergen Reactions   Sulfa Antibiotics Hives   Alendronate Other (See Comments)    Review of Systems  Constitutional:  Negative for chills, fever and weight loss.  HENT:  Positive for congestion and sinus pain. Negative for hearing loss.   Eyes:  Negative for blurred vision and double vision.  Respiratory:  Negative for cough and shortness of breath.   Cardiovascular:  Negative for chest pain, palpitations and leg swelling.   Gastrointestinal:  Negative for abdominal pain, blood in stool, constipation and diarrhea.  Genitourinary:  Negative for dysuria.  Skin:  Negative for rash.  Neurological:  Positive for headaches. Negative for dizziness, speech change, seizures and loss of consciousness.  Psychiatric/Behavioral:  Negative for depression.       Objective:     BP 106/70 (BP Location: Left Arm, Patient Position: Sitting, Cuff Size: Normal)   Pulse 79   Temp 97.7 F (36.5 C) (Oral)   Ht 5' 4.17" (1.63 m)   Wt 163 lb (73.9 kg)   LMP 06/07/2010 (Approximate)   SpO2 98%   BMI 27.83 kg/m    Physical Exam Vitals reviewed.  Constitutional:      Appearance: She is well-developed.  HENT:     Head: Normocephalic and atraumatic.  Eyes:     Pupils: Pupils are equal, round, and reactive to light.  Neck:     Thyroid: No thyromegaly.  Cardiovascular:     Rate and Rhythm: Normal rate and regular rhythm.     Heart sounds: Normal heart sounds. No murmur heard. Pulmonary:     Effort: No respiratory distress.     Breath sounds: Normal breath sounds. No wheezing or rales.  Abdominal:     General: Bowel sounds are normal. There is no distension.     Palpations: Abdomen is soft. There is no mass.     Tenderness: There is no abdominal tenderness. There is no guarding or rebound.  Musculoskeletal:        General: Normal range of motion.     Cervical back: Normal range of motion and neck supple.  Lymphadenopathy:     Cervical: No cervical adenopathy.  Skin:    Findings: No rash.  Neurological:     Mental Status: She is alert and oriented to person, place, and time.     Cranial Nerves: No cranial nerve deficit.  Psychiatric:        Behavior: Behavior normal.        Thought Content: Thought content normal.        Judgment: Judgment normal.      No results found for any visits on 06/30/22.    The 10-year ASCVD risk score (Arnett DK, et al., 2019) is: 1.9%    Assessment & Plan:   Problem List  Items Addressed This Visit   None Visit Diagnoses     Physical exam    -  Primary   Relevant Orders   Basic metabolic panel   Lipid panel   CBC with Differential/Platelet   Hepatic function panel   Hemoglobin A1c     Francyne has multiple chronic problems as above overall stable.  Health maintenance reviewed.  Pap smear mammogram up-to-date.  Colonoscopy up-to-date.  Immunizations up-to-date. -We discussed establishing more consistent exercise with recommendation of minimum  150 minutes of moderate aerobic exercise per week -She had prior prediabetes range blood sugars.  Recheck A1c  -We discussed setting up limited maxillofacial CT of the sinuses given her chronic sinusitis symptoms for many months in spite of multiple antibiotic courses. -She plans to continue GYN follow-up regarding Pap smears and mammograms  No follow-ups on file.    Carolann Littler, MD

## 2022-07-05 ENCOUNTER — Other Ambulatory Visit: Payer: Self-pay | Admitting: Family Medicine

## 2022-07-05 ENCOUNTER — Encounter: Payer: Self-pay | Admitting: Family Medicine

## 2022-07-05 DIAGNOSIS — J0191 Acute recurrent sinusitis, unspecified: Secondary | ICD-10-CM

## 2022-07-05 DIAGNOSIS — J329 Chronic sinusitis, unspecified: Secondary | ICD-10-CM

## 2022-07-05 MED ORDER — AMOXICILLIN-POT CLAVULANATE 875-125 MG PO TABS: 1.0000 | ORAL_TABLET | Freq: Two times a day (BID) | ORAL | 0 refills | Status: AC

## 2022-07-05 NOTE — Progress Notes (Unsigned)
   Established Patient Office Visit  Subjective   Patient ID: Gabriela Shepard, female    DOB: [birth year]  Age: 62 y.o. MRN: [id number]  No chief complaint on file.   HPI  {History (Optional):23778}  ROS    Objective:     LMP 06/07/2010 (Approximate)  {Vitals History (Optional):23777}  Physical Exam   No results found for any visits on 07/05/22.  {Labs (Optional):23779}  The 10-year ASCVD risk score (Arnett DK, et al., 2019) is: 2.2%    Assessment & Plan:   Problem List Items Addressed This Visit   None Visit Diagnoses     Chronic recurrent sinusitis    -  Primary   Relevant Orders   CT Maxillofacial WO CM       No follow-ups on file.    Carolann Littler, MD

## 2022-07-07 DIAGNOSIS — F4310 Post-traumatic stress disorder, unspecified: Secondary | ICD-10-CM | POA: Diagnosis not present

## 2022-07-09 DIAGNOSIS — F339 Major depressive disorder, recurrent, unspecified: Secondary | ICD-10-CM | POA: Diagnosis not present

## 2022-07-09 DIAGNOSIS — F4312 Post-traumatic stress disorder, chronic: Secondary | ICD-10-CM | POA: Diagnosis not present

## 2022-07-12 DIAGNOSIS — F4310 Post-traumatic stress disorder, unspecified: Secondary | ICD-10-CM | POA: Diagnosis not present

## 2022-07-14 NOTE — Progress Notes (Signed)
Gabriela Shepard Gabriela Shepard [address] Gabriela Shepard Phone: [phone] Subjective:   Gabriela Shepard, am serving as a scribe for Dr. Hulan Saas.  I'm seeing this patient by the request  of:  Eulas Post, MD  CC: Multiple complaints  [id number]  Gabriela Shepard is a 62 y.o. female coming in with complaint of back and neck pain. OMT 10/23/2021. Patient states would like manipulation. Has been getting back ablasions. Would like trigger point injections in right shoulder. PT for the right shoulder.  Medications patient has been prescribed: None  Taking:         Reviewed prior external information including notes and imaging from previsou exam, outside providers and external EMR if available.   As well as notes that were available from care everywhere and other healthcare systems.  Past medical history, social, surgical and family history all reviewed in electronic medical record.  No pertanent information unless stated regarding to the chief complaint.   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   History of UTI    Interstitial cystitis    Low sodium levels    Migraines    Osteoporosis    PONV (postoperative nausea and vomiting)    PTSD (post-traumatic stress disorder)    on meds   STD (sexually transmitted disease)    Substance abuse ([facility])    hx of alcohol   Urine incontinence     Allergies  Allergen Reactions   Sulfa Antibiotics Hives   Alendronate Other (See Comments)     Review of Systems:  No , visual changes, nausea, vomiting, diarrhea, constipation, dizziness, abdominal pain, skin rash, fevers,  chills, night sweats, weight loss, swollen lymph nodes,  joint swelling, chest pain, shortness of breath, mood changes. POSITIVE muscle aches, body aches, headache  Objective  Blood pressure 126/72, pulse 92, height 5' 4"$  (1.626 m), weight 159 lb (72.1 kg), last menstrual period 06/07/2010, SpO2 98 %.   General: No apparent distress alert and oriented x3 mood and affect normal, dressed appropriately.  HEENT: Pupils equal, extraocular movements intact  Respiratory: Patient's speak in full sentences and does not appear short of breath  Cardiovascular: No lower extremity edema, non tender, no erythema  Neck exam does have sinus lordosis.  Some tightness noted in the right parascapular area.  Patient does have multiple trigger points noted in the latissimus dorsi, rhomboid and trapezius muscle on the right side.  Osteopathic findings  C2 flexed rotated and side bent right C6 flexed rotated and side bent right T3 extended rotated and side bent right inhaled rib T9 extended rotated and side bent right L2 flexed rotated and side bent right Sacrum right on right   With a 25-gauge half inch needle patient was injected after sterilization with alcohol with 3 cc of 0.5% Marcaine and 1 cc of Kenalog 40 mg.  Into the right trapezius, rhomboid, latissimus dorsi.  No blood loss, Band-Aids placed.  Postinjection instructions given    Assessment and Plan:  Cervical radiculopathy at C6 Patient is seeing  neurosurgery and having radiofrequency ablation intermittently.  In addition to this we did discuss with patient about meloxicam.  I have decreased patient's dosing to 7.5 mg and on bad days can take it twice a day but did discussed every 10 days to take a 48-hour hiatus.  We discussed formal physical therapy and dry needling and referral placed today for scapular discomfort and pain.  Attempted osteopathic the patient was somewhat resolute to bleeding improvement.  Follow-up again in 6 to 8 weeks     Nonallopathic problems  Decision today to treat with OMT was based on Physical Exam  After verbal consent patient was treated with HVLA, ME, FPR techniques in cervical, rib, thoracic, lumbar, and sacral  areas avoided any HVLA on patient's cervical spine.  Patient tolerated the procedure well with improvement in symptoms  Patient given exercises, stretches and lifestyle modifications  See medications in patient instructions if given  Patient will follow up in 4-8 weeks     The above documentation has been reviewed and is accurate and complete Gabriela Pulley, DO         Note: This dictation was prepared with Dragon dictation along with smaller phrase technology. Any transcriptional errors that result from this process are unintentional.

## 2022-07-16 ENCOUNTER — Encounter: Payer: Self-pay | Admitting: Family Medicine

## 2022-07-16 ENCOUNTER — Ambulatory Visit: Payer: BC Managed Care – PPO | Admitting: Family Medicine

## 2022-07-16 VITALS — BP 126/72 | HR 92 | Ht 64.0 in | Wt 159.0 lb

## 2022-07-16 DIAGNOSIS — M9902 Segmental and somatic dysfunction of thoracic region: Secondary | ICD-10-CM

## 2022-07-16 DIAGNOSIS — M9901 Segmental and somatic dysfunction of cervical region: Secondary | ICD-10-CM | POA: Diagnosis not present

## 2022-07-16 DIAGNOSIS — M9908 Segmental and somatic dysfunction of rib cage: Secondary | ICD-10-CM | POA: Diagnosis not present

## 2022-07-16 DIAGNOSIS — M5412 Radiculopathy, cervical region: Secondary | ICD-10-CM | POA: Diagnosis not present

## 2022-07-16 DIAGNOSIS — M9903 Segmental and somatic dysfunction of lumbar region: Secondary | ICD-10-CM

## 2022-07-16 DIAGNOSIS — M25511 Pain in right shoulder: Secondary | ICD-10-CM | POA: Diagnosis not present

## 2022-07-16 DIAGNOSIS — M9904 Segmental and somatic dysfunction of sacral region: Secondary | ICD-10-CM

## 2022-07-16 MED ORDER — MELOXICAM 7.5 MG PO TABS: 7.5000 mg | ORAL_TABLET | Freq: Two times a day (BID) | ORAL | 0 refills | Status: DC

## 2022-07-16 NOTE — Assessment & Plan Note (Signed)
Patient is seeing neurosurgery and having radiofrequency ablation intermittently.  In addition to this we did discuss with patient about meloxicam.  I have decreased patient's dosing to 7.5 mg and on bad days can take it twice a day but did discussed every 10 days to take a 48-hour hiatus.  We discussed formal physical therapy and dry needling and referral placed today for scapular discomfort and pain.  Attempted osteopathic the patient was somewhat resolute to bleeding improvement.  Follow-up again in 6 to 8 weeks

## 2022-07-16 NOTE — Patient Instructions (Addendum)
Pt Referral Meloxicam filled. Try to do 1 a day, but can take two when needed. Every 10 days take a 48 hour hiatus Trigger Point injections today Tramadol with tylenol See you again in 7-8 weeks

## 2022-07-16 NOTE — Assessment & Plan Note (Signed)
Repeat injections given again today secondary to chronic problem with worsening symptoms.  Discussed icing regimen and home exercises, which activities to do and which ones to avoid.  Increase activity slowly.  Follow-up again in 6 to 8 weeks

## 2022-07-22 ENCOUNTER — Encounter: Payer: Self-pay | Admitting: Family Medicine

## 2022-07-26 ENCOUNTER — Ambulatory Visit
Admission: RE | Admit: 2022-07-26 | Discharge: 2022-07-26 | Disposition: A | Discharge status: Home / Self Care | Payer: BC Managed Care – PPO | Source: Ambulatory Visit | Attending: Family Medicine | Admitting: Family Medicine

## 2022-07-26 DIAGNOSIS — J329 Chronic sinusitis, unspecified: Secondary | ICD-10-CM | POA: Diagnosis not present

## 2022-07-26 NOTE — Addendum Note (Signed)
Addended by: Nilda Riggs on: 07/26/2022 01:03 PM   Modules accepted: Orders

## 2022-07-27 ENCOUNTER — Other Ambulatory Visit: Payer: Self-pay | Admitting: Family Medicine

## 2022-07-28 DIAGNOSIS — F4310 Post-traumatic stress disorder, unspecified: Secondary | ICD-10-CM | POA: Diagnosis not present

## 2022-07-30 ENCOUNTER — Other Ambulatory Visit: Payer: Self-pay | Admitting: Family Medicine

## 2022-08-02 ENCOUNTER — Encounter: Payer: Self-pay | Admitting: Rehabilitative and Restorative Service Providers"

## 2022-08-02 ENCOUNTER — Other Ambulatory Visit: Payer: Self-pay

## 2022-08-02 ENCOUNTER — Ambulatory Visit: Payer: BC Managed Care – PPO | Attending: Family Medicine | Admitting: Rehabilitative and Restorative Service Providers"

## 2022-08-02 ENCOUNTER — Ambulatory Visit: Payer: BC Managed Care – PPO | Admitting: Family Medicine

## 2022-08-02 DIAGNOSIS — R293 Abnormal posture: Secondary | ICD-10-CM | POA: Diagnosis not present

## 2022-08-02 DIAGNOSIS — F4310 Post-traumatic stress disorder, unspecified: Secondary | ICD-10-CM | POA: Diagnosis not present

## 2022-08-02 DIAGNOSIS — R252 Cramp and spasm: Secondary | ICD-10-CM

## 2022-08-02 DIAGNOSIS — M546 Pain in thoracic spine: Secondary | ICD-10-CM | POA: Diagnosis not present

## 2022-08-02 DIAGNOSIS — M25511 Pain in right shoulder: Secondary | ICD-10-CM | POA: Diagnosis not present

## 2022-08-02 DIAGNOSIS — M6281 Muscle weakness (generalized): Secondary | ICD-10-CM | POA: Diagnosis not present

## 2022-08-02 NOTE — Patient Instructions (Signed)
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
## 2022-08-05 ENCOUNTER — Encounter: Payer: Self-pay | Admitting: Rehabilitative and Restorative Service Providers"

## 2022-08-05 ENCOUNTER — Ambulatory Visit: Payer: BC Managed Care – PPO | Admitting: Rehabilitative and Restorative Service Providers"

## 2022-08-05 ENCOUNTER — Encounter: Payer: Self-pay | Admitting: Family Medicine

## 2022-08-05 DIAGNOSIS — M546 Pain in thoracic spine: Secondary | ICD-10-CM

## 2022-08-05 DIAGNOSIS — M25511 Pain in right shoulder: Secondary | ICD-10-CM | POA: Diagnosis not present

## 2022-08-05 DIAGNOSIS — R293 Abnormal posture: Secondary | ICD-10-CM | POA: Diagnosis not present

## 2022-08-05 DIAGNOSIS — R252 Cramp and spasm: Secondary | ICD-10-CM | POA: Diagnosis not present

## 2022-08-05 DIAGNOSIS — M6281 Muscle weakness (generalized): Secondary | ICD-10-CM | POA: Diagnosis not present

## 2022-08-05 MED ORDER — CETIRIZINE HCL 10 MG PO TABS: 10.0000 mg | ORAL_TABLET | Freq: Every day | ORAL | 3 refills | Status: DC
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
## 2022-08-06 DIAGNOSIS — F339 Major depressive disorder, recurrent, unspecified: Secondary | ICD-10-CM | POA: Diagnosis not present

## 2022-08-06 DIAGNOSIS — F4312 Post-traumatic stress disorder, chronic: Secondary | ICD-10-CM | POA: Diagnosis not present

## 2022-08-09 DIAGNOSIS — F4310 Post-traumatic stress disorder, unspecified: Secondary | ICD-10-CM | POA: Diagnosis not present

## 2022-08-10 ENCOUNTER — Encounter: Payer: Self-pay | Admitting: Rehabilitative and Restorative Service Providers"

## 2022-08-10 ENCOUNTER — Ambulatory Visit: Payer: BC Managed Care – PPO | Attending: Family Medicine | Admitting: Rehabilitative and Restorative Service Providers"

## 2022-08-10 DIAGNOSIS — R293 Abnormal posture: Secondary | ICD-10-CM | POA: Diagnosis not present

## 2022-08-10 DIAGNOSIS — M6281 Muscle weakness (generalized): Secondary | ICD-10-CM | POA: Insufficient documentation

## 2022-08-10 DIAGNOSIS — R252 Cramp and spasm: Secondary | ICD-10-CM | POA: Diagnosis not present

## 2022-08-10 DIAGNOSIS — M546 Pain in thoracic spine: Secondary | ICD-10-CM | POA: Diagnosis not present
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
## 2022-08-12 ENCOUNTER — Ambulatory Visit: Payer: BC Managed Care – PPO | Admitting: Physical Therapy

## 2022-08-13 DIAGNOSIS — F339 Major depressive disorder, recurrent, unspecified: Secondary | ICD-10-CM | POA: Diagnosis not present

## 2022-08-13 DIAGNOSIS — F4312 Post-traumatic stress disorder, chronic: Secondary | ICD-10-CM | POA: Diagnosis not present

## 2022-08-18 ENCOUNTER — Ambulatory Visit: Payer: BC Managed Care – PPO | Admitting: Rehabilitative and Restorative Service Providers"

## 2022-08-20 DIAGNOSIS — F339 Major depressive disorder, recurrent, unspecified: Secondary | ICD-10-CM | POA: Diagnosis not present

## 2022-08-20 DIAGNOSIS — F4312 Post-traumatic stress disorder, chronic: Secondary | ICD-10-CM | POA: Diagnosis not present

## 2022-08-23 ENCOUNTER — Encounter: Payer: Self-pay | Admitting: Rehabilitative and Restorative Service Providers"

## 2022-08-23 ENCOUNTER — Ambulatory Visit: Payer: BC Managed Care – PPO | Admitting: Rehabilitative and Restorative Service Providers"

## 2022-08-23 DIAGNOSIS — M6281 Muscle weakness (generalized): Secondary | ICD-10-CM

## 2022-08-23 DIAGNOSIS — R252 Cramp and spasm: Secondary | ICD-10-CM | POA: Diagnosis not present

## 2022-08-23 DIAGNOSIS — M546 Pain in thoracic spine: Secondary | ICD-10-CM | POA: Diagnosis not present

## 2022-08-23 DIAGNOSIS — R293 Abnormal posture: Secondary | ICD-10-CM
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
## 2022-08-27 DIAGNOSIS — F339 Major depressive disorder, recurrent, unspecified: Secondary | ICD-10-CM | POA: Diagnosis not present

## 2022-08-27 DIAGNOSIS — F4312 Post-traumatic stress disorder, chronic: Secondary | ICD-10-CM | POA: Diagnosis not present

## 2022-08-30 ENCOUNTER — Ambulatory Visit: Payer: BC Managed Care – PPO

## 2022-09-02 ENCOUNTER — Ambulatory Visit: Payer: BC Managed Care – PPO | Admitting: Physical Therapy

## 2022-09-06 ENCOUNTER — Telehealth: Payer: Self-pay | Admitting: Rehabilitative and Restorative Service Providers"

## 2022-09-06 ENCOUNTER — Ambulatory Visit: Payer: BC Managed Care – PPO | Attending: Family Medicine | Admitting: Rehabilitative and Restorative Service Providers"

## 2022-09-06 DIAGNOSIS — R252 Cramp and spasm: Secondary | ICD-10-CM | POA: Insufficient documentation

## 2022-09-06 DIAGNOSIS — M6281 Muscle weakness (generalized): Secondary | ICD-10-CM | POA: Insufficient documentation

## 2022-09-06 DIAGNOSIS — M546 Pain in thoracic spine: Secondary | ICD-10-CM | POA: Insufficient documentation

## 2022-09-06 DIAGNOSIS — R293 Abnormal posture: Secondary | ICD-10-CM | POA: Insufficient documentation

## 2022-09-06 NOTE — Telephone Encounter (Signed)
Called patient secondary to missed appointment.  Pt apologizes stating that she thought she cancelled the appointment yesterday online, as she had a conflicting work schedule.  Patient states that she will be present for next scheduled visit.

## 2022-09-09 ENCOUNTER — Encounter: Payer: Self-pay | Admitting: Rehabilitative and Restorative Service Providers"

## 2022-09-09 ENCOUNTER — Ambulatory Visit: Payer: BC Managed Care – PPO | Admitting: Rehabilitative and Restorative Service Providers"

## 2022-09-09 DIAGNOSIS — R252 Cramp and spasm: Secondary | ICD-10-CM | POA: Diagnosis not present

## 2022-09-09 DIAGNOSIS — R293 Abnormal posture: Secondary | ICD-10-CM | POA: Diagnosis not present

## 2022-09-09 DIAGNOSIS — F4310 Post-traumatic stress disorder, unspecified: Secondary | ICD-10-CM | POA: Diagnosis not present

## 2022-09-09 DIAGNOSIS — M546 Pain in thoracic spine: Secondary | ICD-10-CM | POA: Diagnosis not present

## 2022-09-09 DIAGNOSIS — M6281 Muscle weakness (generalized): Secondary | ICD-10-CM
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
## 2022-09-13 ENCOUNTER — Ambulatory Visit: Payer: BC Managed Care – PPO | Admitting: Physical Therapy

## 2022-09-14 ENCOUNTER — Ambulatory Visit: Payer: BC Managed Care – PPO | Admitting: Family Medicine

## 2022-09-15 ENCOUNTER — Ambulatory Visit: Payer: BC Managed Care – PPO | Admitting: Rehabilitative and Restorative Service Providers"

## 2022-09-20 ENCOUNTER — Ambulatory Visit: Payer: BC Managed Care – PPO | Admitting: Rehabilitative and Restorative Service Providers"

## 2022-09-24 ENCOUNTER — Ambulatory Visit: Payer: BC Managed Care – PPO | Admitting: Rehabilitative and Restorative Service Providers"

## 2022-10-06 DIAGNOSIS — M47812 Spondylosis without myelopathy or radiculopathy, cervical region: Secondary | ICD-10-CM | POA: Diagnosis not present

## 2022-10-22 ENCOUNTER — Other Ambulatory Visit: Payer: Self-pay | Admitting: Family Medicine

## 2022-10-26 ENCOUNTER — Ambulatory Visit: Payer: BC Managed Care – PPO | Admitting: Family Medicine

## 2022-10-26 VITALS — BP 130/80 | HR 64 | Ht 64.0 in | Wt 158.0 lb

## 2022-10-26 DIAGNOSIS — G894 Chronic pain syndrome: Secondary | ICD-10-CM | POA: Diagnosis not present

## 2022-10-26 DIAGNOSIS — M791 Myalgia, unspecified site: Secondary | ICD-10-CM

## 2022-10-26 DIAGNOSIS — M797 Fibromyalgia: Secondary | ICD-10-CM

## 2022-10-26 DIAGNOSIS — R5383 Other fatigue: Secondary | ICD-10-CM | POA: Diagnosis not present

## 2022-10-26 DIAGNOSIS — M2550 Pain in unspecified joint: Secondary | ICD-10-CM

## 2022-10-26 LAB — TSH: TSH: 2.01 u[IU]/mL (ref 0.35–5.50)

## 2022-10-26 LAB — SEDIMENTATION RATE: Sed Rate: 32 mm/hr — ABNORMAL HIGH (ref 0–30)

## 2022-10-26 LAB — CK: Total CK: 59 U/L (ref 7–177)

## 2022-10-26 LAB — URIC ACID: Uric Acid, Serum: 4.3 mg/dL (ref 2.4–7.0)

## 2022-10-26 MED ORDER — PREDNISONE 5 MG (48) PO TBPK: ORAL_TABLET | ORAL | 0 refills | Status: DC

## 2022-10-26 MED ORDER — PREGABALIN 75 MG PO CAPS: 75.0000 mg | ORAL_CAPSULE | Freq: Two times a day (BID) | ORAL | 3 refills | Status: DC

## 2022-10-26 NOTE — Patient Instructions (Addendum)
Thank you for coming in today.   Please get labs today before you leave   Do some reading about fibromyalgia.   There are medicines to try including, gabapentin, lyrica, cymbalta,  and others even strnage things like low dose naltrexone.   Look up complex regional pain syndrome.   Recheck in 2 weeks.

## 2022-10-26 NOTE — Progress Notes (Unsigned)
I, Stevenson Clinch, CMA acting as a scribe for Gabriela Graham, MD.  Gabriela Shepard is a 62 y.o. female who presents to [facility] today for neck pain. Pt was previously seen by Dr. Katrinka Blazing on 07/16/22 and was given R trapezius, rhomboid, latissimus dorsi trigger point steroid injections and OMT. She also has completed 4 PT visits. Her last cervical ESI ws on 03/30/21.   Today, pt reports neck pain. Pt locates pain to upper back, traps, periscapular region, and neck. Sx are progressively worsening, affecting ability to work. C/o multiple joint pain and myalgia. Notes HA/migraines associated with neck pain. Notes focal region in the upper right side of back that feels like needles/sunburn. Hx of chicken pox, has not had the singles, received shingles vaccine. All symptoms seem to flare together. Sx start to affect the sinuses and pt becomes nauseated. Notes that last night she vomited d/t the pain. Has had normal rheumatology work-up in the past.    Dx imaging: 03/23/21 C-spine MRI  10/28/20 C-spine XR  Pertinent review of systems: No fevers or chills  Relevant historical information: Had rheumatologic workup about 2 years ago.  ANA was mildly positive but after seeing Dr. Corliss Skains there was thought that she did not actually have a rheumatologic problem.   Exam:  BP 130/80   Pulse 64   Ht 5\' 4"  (1.626 m)   Wt 158 lb (71.7 kg)   LMP 06/07/2010 (Approximate)   SpO2 98%   BMI 27.12 kg/m  General: Well Developed, well nourished, and in no acute distress.   MSK: C-spine nontender palpation spinal midline.  Tender palpation right cervical paraspinal musculature. Decreased cervical motion. Diffusely tender across multiple locations upper lower extremities and along back.    Lab and Radiology Results Results for orders placed or performed in visit on 10/26/22 (from the past 72 hour(s))  TSH     Status: None   Collection Time: 10/26/22  3:30 PM  Result Value  Ref Range   TSH 2.01 0.35 - 5.50 uIU/mL  CK     Status: None   Collection Time: 10/26/22  3:30 PM  Result Value Ref Range   Total CK 59 7 - 177 U/L  Uric acid     Status: None   Collection Time: 10/26/22  3:30 PM  Result Value Ref Range   Uric Acid, Serum 4.3 2.4 - 7.0 mg/dL  Sedimentation rate     Status: Abnormal   Collection Time: 10/26/22  3:30 PM  Result Value Ref Range   Sed Rate 32 (H) 0 - 30 mm/hr   No results found.     Assessment and Plan: 62 y.o. female with chronic arthralgias and myalgias.  This occurs as a chronic pain syndrome across multiple locations in her body.  It is possible she has multiple different orthopedic issues contributing to her pain.  I am worried there may be a underlying issue.  Plan for repeat rheumatologic workup.  Additionally I did complete a fibromyalgia evaluation as she does meet criteria for fibromyalgia.  This will be scanned into the chart. Will try Lyrica to see how well that works.  Additionally prescribed a short course of prednisone.  Check back in 2 weeks.     PDMP reviewed during this encounter. Orders Placed This Encounter  Procedures   Angiotensin converting enzyme    Standing Status:   Future    Number of Occurrences:   1    Standing Expiration Date:  10/26/2023   Sedimentation rate    Standing Status:   Future    Number of Occurrences:   1    Standing Expiration Date:   10/26/2023   Rheumatoid factor    Standing Status:   Future    Number of Occurrences:   1    Standing Expiration Date:   10/26/2023   Cyclic citrul peptide antibody, IgG    Standing Status:   Future    Number of Occurrences:   1    Standing Expiration Date:   10/26/2023   ANA    Standing Status:   Future    Number of Occurrences:   1    Standing Expiration Date:   10/26/2023   Uric acid    Standing Status:   Future    Number of Occurrences:   1    Standing Expiration Date:   10/26/2023   CK    Standing Status:   Future    Number of Occurrences:   1     Standing Expiration Date:   10/26/2023   Complement, total    Standing Status:   Future    Number of Occurrences:   1    Standing Expiration Date:   10/26/2023   HLA-B27 antigen    Standing Status:   Future    Number of Occurrences:   1    Standing Expiration Date:   10/26/2023   TSH    Standing Status:   Future    Number of Occurrences:   1    Standing Expiration Date:   10/26/2023   Meds ordered this encounter  Medications   pregabalin (LYRICA) 75 MG capsule    Sig: Take 1 capsule (75 mg total) by mouth 2 (two) times daily.    Dispense:  60 capsule    Refill:  3   predniSONE (STERAPRED UNI-PAK 48 TAB) 5 MG (48) TBPK tablet    Sig: 12 day dosepack po    Dispense:  48 tablet    Refill:  0     Discussed warning signs or symptoms. Please see discharge instructions. Patient expresses understanding.   The above documentation has been reviewed and is accurate and complete Gabriela Shepard, M.D.

## 2022-10-27 ENCOUNTER — Encounter: Payer: Self-pay | Admitting: Family Medicine

## 2022-10-27 DIAGNOSIS — M797 Fibromyalgia: Secondary | ICD-10-CM | POA: Insufficient documentation

## 2022-10-28 LAB — ANTI-NUCLEAR AB-TITER (ANA TITER): ANA Titer 1: 1:320 {titer} — ABNORMAL HIGH

## 2022-10-28 LAB — COMPLEMENT, TOTAL: Compl, Total (CH50): >60 U/mL — ABNORMAL HIGH (ref 31–60)

## 2022-10-28 LAB — ANGIOTENSIN CONVERTING ENZYME: Angiotensin-Converting Enzyme: 35 U/L (ref 9–67)

## 2022-10-28 LAB — ANA: Anti Nuclear Antibody (ANA): POSITIVE — AB

## 2022-10-28 LAB — RHEUMATOID FACTOR: Rheumatoid fact SerPl-aCnc: <10 IU/mL (ref <14)

## 2022-10-28 LAB — CYCLIC CITRUL PEPTIDE ANTIBODY, IGG: Cyclic Citrullin Peptide Ab: <16 UNITS

## 2022-10-28 LAB — HLA-B27 ANTIGEN: HLA-B27 Antigen: NEGATIVE

## 2022-10-28 NOTE — Progress Notes (Signed)
Early labs from rheumatologic workup so far are largely normal.  Sedimentation rate which is a marker of general inflammation is minimally elevated at 32.  Normal value is 30 so this does not mean much.  Thyroid looks normal.  CK a marker for muscle inflammation looks normal.  Uric acid looking for gout looks normal.  Other labs are still pending.

## 2022-10-28 NOTE — Telephone Encounter (Signed)
Forwarding to Dr. Corey to review and advise.  

## 2022-10-29 ENCOUNTER — Other Ambulatory Visit: Payer: Self-pay

## 2022-10-29 ENCOUNTER — Encounter: Payer: Self-pay | Admitting: Family Medicine

## 2022-10-29 DIAGNOSIS — M2550 Pain in unspecified joint: Secondary | ICD-10-CM

## 2022-10-29 NOTE — Progress Notes (Signed)
Rheumatology labs are abnormal.  Would you like a second opinion with a different rheumatologist?

## 2022-11-09 ENCOUNTER — Ambulatory Visit: Payer: BC Managed Care – PPO | Admitting: Family Medicine

## 2022-11-09 DIAGNOSIS — H11123 Conjunctival concretions, bilateral: Secondary | ICD-10-CM | POA: Diagnosis not present

## 2022-11-09 DIAGNOSIS — H10403 Unspecified chronic conjunctivitis, bilateral: Secondary | ICD-10-CM | POA: Diagnosis not present

## 2022-11-16 ENCOUNTER — Ambulatory Visit: Payer: BC Managed Care – PPO | Admitting: Family Medicine

## 2022-11-16 ENCOUNTER — Encounter: Payer: Self-pay | Admitting: Family Medicine

## 2022-11-16 VITALS — BP 120/60 | HR 63 | Temp 97.5°F | Ht 64.0 in | Wt 155.6 lb

## 2022-11-16 DIAGNOSIS — F4310 Post-traumatic stress disorder, unspecified: Secondary | ICD-10-CM | POA: Diagnosis not present

## 2022-11-16 DIAGNOSIS — J309 Allergic rhinitis, unspecified: Secondary | ICD-10-CM

## 2022-11-16 DIAGNOSIS — M797 Fibromyalgia: Secondary | ICD-10-CM | POA: Diagnosis not present

## 2022-11-16 NOTE — Progress Notes (Signed)
Established Patient Office Visit  Subjective   Patient ID: Gabriela Shepard, female    DOB: [birth year]  Age: 62 y.o. MRN: [id number]  Chief Complaint  Patient presents with   Medication Consultation    HPI   Gabriela Shepard has history of multiple chronic issues including intermittent migraine headaches, IBS, osteoarthritis involving multiple joints, osteoporosis, interstitial cystitis, recurrent depression, posttraumatic stress disorder, and recent diagnosis of fibromyalgia per sports medicine.  She seen today to bring [place] up to speed with several things.  Went to sports medicine and had several blood tests and had positive ANA which we explained is very nonspecific.  Her sed rate was only minimally elevated at 32.  Other rheumatologic screens were basically negative.  She was started on fibromyalgia medication with Lyrica 75 mg twice daily.  She had lightheadedness and dry mouth and has basically only been taking her nighttime dose.  Has not seen much benefit yet.  She has longstanding history of depression and posttraumatic stress disorder.  She has been seeing a counselor regularly down in [place] along with psychiatrist there.  They were actually using ketamine 200 mg with each session and cost has been prohibitive for seeing the psychiatrist.  She plans to continue with her behavioral therapy there.  She is currently trying to find out if there is any other way she can get the ketamine prescribed without going through her psychiatrist there because of cost  He had some ongoing sinus congestive symptoms and thinks this may be more allergy related.  No bloody nasal discharge or purulent secretions.  No recent fever.  Currently on Zyrtec and Singulair.  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   History of UTI    Interstitial cystitis    Low sodium levels    Migraines    Osteoporosis    PONV (postoperative nausea and vomiting)    PTSD (post-traumatic stress disorder)    on meds   STD (sexually transmitted disease)    Substance abuse (HCC)    hx of alcohol   Urine incontinence    Past Surgical History:  Procedure Laterality Date   BREAST BIOPSY Left    2017 benign x 2   CESAREAN SECTION  1992   COLONOSCOPY  2018   JMP-MAC-suprep(good)-polyps   INCONTINENCE SURGERY     LAPAROSCOPY     under bladder   NECK SURGERY  02/24/2018   ACDF   POLYPECTOMY  2018   polyps removed   TOTAL HIP ARTHROPLASTY Right 09/08/2020   TOTAL HIP ARTHROPLASTY Left 2018   TOTAL KNEE ARTHROPLASTY Left 12/23/2015   Procedure: TOTAL KNEE ARTHROPLASTY;  Surgeon: Marcene Corning, MD;  Location: [facility] OR;  Service: Orthopedics;  Laterality: Left;    reports that she quit smoking about 12 years ago. Her smoking use included cigarettes. She has a 5.00 pack-year smoking history. She has never used smokeless tobacco. She reports that she does not drink alcohol and does not use drugs. family history includes Alcohol abuse in her brother and another family member;  Alcoholism in her brother; Arthritis in an other family member; Cancer (age of onset: 14) in her father; Drug abuse in her sister; Healthy in her daughter and son; Heart disease in her father; Hypertension in her maternal grandmother, mother, and another family member; Kidney cancer in her father; Lung cancer in her mother; Mental illness in an other family member; Ulcers in her father. Allergies  Allergen Reactions   Sulfa Antibiotics Hives   Alendronate Other (See Comments)    Review of Systems  HENT:  Positive for congestion.   Cardiovascular:  Negative for chest pain.  Musculoskeletal:  Positive  for joint pain and myalgias.  Psychiatric/Behavioral:  Negative for suicidal ideas.       Objective:     BP 120/60 (BP Location: Left Arm, Patient Position: Sitting, Cuff Size: Normal)   Pulse 63   Temp (!) 97.5 F (36.4 C) (Oral)   Ht 5\' 4"  (1.626 m)   Wt 155 lb 9.6 oz (70.6 kg)   LMP 06/07/2010 (Approximate)   SpO2 99%   BMI 26.71 kg/m  BP Readings from Last 3 Encounters:  11/16/22 120/60  10/26/22 130/80  07/16/22 126/72   Wt Readings from Last 3 Encounters:  11/16/22 155 lb 9.6 oz (70.6 kg)  10/26/22 158 lb (71.7 kg)  07/16/22 159 lb (72.1 kg)      Physical Exam Vitals reviewed.  Constitutional:      Appearance: Normal appearance.  HENT:     Head: Normocephalic and atraumatic.  Cardiovascular:     Rate and Rhythm: Normal rate and regular rhythm.  Pulmonary:     Effort: Pulmonary effort is normal.     Breath sounds: Normal breath sounds.  Neurological:     Mental Status: She is alert.  Psychiatric:        Mood and Affect: Mood normal.        Thought Content: Thought content normal.        Judgment: Judgment normal.      No results found for any visits on 11/16/22.    The 10-year ASCVD risk score (Arnett DK, et al., 2019) is: 2.7%    Assessment & Plan:   #1 diffuse myalgias.  Recent diagnosis of fibromyalgia.  She is currently on Lyrica but only taking once daily.  She has had some mild side effects from daytime use.  She will try 1 more trial of Lyrica twice daily.  If remains unable to tolerate daytime dose consider possible trial of Cymbalta but would have to come off her Lexapro.  #2 seasonal and perennial allergic rhinitis.  Continue Zyrtec and Singulair.  Add back Flonase regularly  #3 history of recurrent depression and posttraumatic stress disorder.  Patient is getting regular psychotherapy.  As above, she has seen psychiatrist down in [place] who is prescribing ketamine and taking 200 mg prior to her psychotherapy sessions.  She is looking  currently for someone else to provide the ketamine, if possible, because of cost issues with seeing the psychiatrist   [name], MD

## 2022-11-17 ENCOUNTER — Ambulatory Visit: Payer: BC Managed Care – PPO | Admitting: Family Medicine

## 2022-11-24 ENCOUNTER — Encounter: Payer: Self-pay | Admitting: Family Medicine

## 2022-11-24 DIAGNOSIS — M1991 Primary osteoarthritis, unspecified site: Secondary | ICD-10-CM | POA: Diagnosis not present

## 2022-11-24 DIAGNOSIS — M2550 Pain in unspecified joint: Secondary | ICD-10-CM | POA: Diagnosis not present

## 2022-11-24 DIAGNOSIS — R768 Other specified abnormal immunological findings in serum: Secondary | ICD-10-CM | POA: Diagnosis not present

## 2022-11-24 DIAGNOSIS — R5383 Other fatigue: Secondary | ICD-10-CM

## 2022-11-27 MED ORDER — GABAPENTIN 300 MG PO CAPS: ORAL_CAPSULE | ORAL | 5 refills | Status: AC

## 2022-11-27 NOTE — Telephone Encounter (Signed)
Gabapentin for fibromyalgia is an "off-lable" use.  Stop the Lyrica.  Start Gabapentin 300 mg po at night for 2 days and then increase to one po bid.   We usually target one three times daily, but I would suggest starting with just twice daily to make sure tolerating.   See if we can get follow up within next month or so to assess response.  Home sleep study has also been ordered.  Kristian Covey MD [facility]

## 2022-12-07 ENCOUNTER — Encounter: Payer: Self-pay | Admitting: Family Medicine

## 2022-12-10 MED ORDER — DICLOFENAC SODIUM 75 MG PO TBEC: DELAYED_RELEASE_TABLET | ORAL | 0 refills | Status: DC

## 2022-12-20 ENCOUNTER — Other Ambulatory Visit: Payer: Self-pay | Admitting: Family Medicine

## 2022-12-20 NOTE — Progress Notes (Deleted)
[facility] [address] [place] [address] Phone: [phone] Subjective:    I'm seeing this patient by the request  of:  Kristian Covey, MD  CC:   [id number]  Gabriela Shepard is a 62 y.o. female coming in with complaint of back and neck pain. OMT on 07/16/2022. Patient states   Medications patient has been prescribed: zanaflex mobic  Taking:      Patient also responded relatively well to a facet injection in April 2023.   Reviewed prior external information including notes and imaging from previsou exam, outside providers and external EMR if available.   As well as notes that were available from care everywhere and other healthcare systems.  Past medical history, social, surgical and family history all reviewed in electronic medical record.  No pertanent information unless stated regarding to the chief complaint.   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   History of UTI    Interstitial cystitis    Low sodium levels    Migraines    Osteoporosis    PONV (postoperative nausea and vomiting)    PTSD (post-traumatic stress disorder)    on meds   STD (sexually transmitted disease)    Substance abuse (HCC)    hx of alcohol   Urine incontinence     Allergies  Allergen Reactions   Sulfa Antibiotics Hives   Alendronate Other (See Comments)     Review of Systems:  No headache, visual changes, nausea, vomiting, diarrhea, constipation, dizziness, abdominal pain, skin rash, fevers, chills, night sweats, weight loss, swollen lymph nodes, body aches, joint swelling, chest pain, shortness of breath, mood  changes. POSITIVE muscle aches  Objective  Last menstrual period 06/07/2010.   General: No apparent distress alert and oriented x3 mood and affect normal, dressed appropriately.  HEENT: Pupils equal, extraocular movements intact  Respiratory: Patient's speak in full sentences and does not appear short of breath  Cardiovascular: No lower extremity edema, non tender, no erythema  Gait MSK:  Back   Osteopathic findings  C2 flexed rotated and side bent right C6 flexed rotated and side bent left T3 extended rotated and side bent right inhaled rib T9 extended rotated and side bent left L2 flexed rotated and side bent right Sacrum right on right       Assessment and Plan:  No problem-specific Assessment & Plan notes found for this encounter.    Nonallopathic problems  Decision today to treat with OMT was based on Physical Exam  After verbal consent patient was treated with HVLA, ME, FPR techniques in cervical, rib, thoracic, lumbar, and sacral  areas  Patient tolerated the procedure well with improvement in symptoms  Patient given exercises, stretches and lifestyle modifications  See medications in patient instructions if given  Patient will follow up in 4-8 weeks     The above documentation has been reviewed and is accurate and complete Judi Saa, DO         Note: This dictation was prepared with Dragon dictation along with smaller  Lobbyist. Any transcriptional errors that result from this process are unintentional.

## 2022-12-22 ENCOUNTER — Encounter: Payer: Self-pay | Admitting: Obstetrics and Gynecology

## 2022-12-22 ENCOUNTER — Ambulatory Visit: Payer: BC Managed Care – PPO | Admitting: Obstetrics and Gynecology

## 2022-12-22 VITALS — BP 129/83 | HR 79

## 2022-12-22 DIAGNOSIS — R102 Pelvic and perineal pain: Secondary | ICD-10-CM

## 2022-12-22 DIAGNOSIS — R350 Frequency of micturition: Secondary | ICD-10-CM | POA: Diagnosis not present

## 2022-12-22 DIAGNOSIS — M62838 Other muscle spasm: Secondary | ICD-10-CM | POA: Diagnosis not present

## 2022-12-22 DIAGNOSIS — N952 Postmenopausal atrophic vaginitis: Secondary | ICD-10-CM

## 2022-12-22 LAB — POCT URINALYSIS DIPSTICK
Bilirubin, UA: NEGATIVE
Blood, UA: NEGATIVE
Glucose, UA: NEGATIVE
Ketones, UA: NEGATIVE
Leukocytes, UA: NEGATIVE
Nitrite, UA: NEGATIVE
Protein, UA: NEGATIVE
Spec Grav, UA: 1.015 (ref 1.010–1.025)
Urobilinogen, UA: 0.2 E.U./dL
pH, UA: 7 (ref 5.0–8.0)

## 2022-12-22 MED ORDER — ESTRADIOL 0.1 MG/GM VA CREA: 0.5000 g | TOPICAL_CREAM | VAGINAL | 11 refills | Status: DC

## 2022-12-22 MED ORDER — LIDOCAINE-PRILOCAINE 2.5-2.5 % EX CREA: 1.0000 | TOPICAL_CREAM | CUTANEOUS | 0 refills | Status: AC | PRN

## 2022-12-22 NOTE — Progress Notes (Signed)
[facility] Return Visit  SUBJECTIVE  History of Present Illness: Gabriela Shepard is a 62 y.o. female seen in follow-up for IC. Plan at last visit was start compounded Gabapentin/Amitriptyline cream for vulvar discomfort, start pelvic floor PT, and use yuvafem x2 weekly for vaginal atrophy.   Has had a hip replacement. She reports she has noticed white spots in the vagina and has had an odor. Also has had significant dryness and reports she did not use the compounded cream and has not been using vaginal estrogen cream.   Has been using her pelvic wand and reports she has been having some irritation and is concerned about having an IC flare.   Past Medical History: Patient  has a past medical history of Allergy, Arthritis, Bowel habit changes, Depression, Flatulence, GERD (gastroesophageal reflux disease), Headache, History of alcohol abuse, History of UTI, Interstitial cystitis, Low sodium levels, Migraines, Osteoporosis, PONV (postoperative nausea and vomiting), PTSD (post-traumatic stress disorder), STD (sexually transmitted disease), Substance abuse (HCC), and Urine incontinence.   Past Surgical History: She  has a past surgical history that includes Cesarean section (1992); laparoscopy; Total knee arthroplasty (Left, 12/23/2015); Breast biopsy (Left); Neck surgery (02/24/2018); Total hip arthroplasty (Right, 09/08/2020); Incontinence surgery; Total hip arthroplasty (Left, 2018); Colonoscopy (2018); and Polypectomy (2018).   Medications: She has a current medication list which includes the following prescription(s): aimovig, cetirizine, vitamin d3, clobetasol cream, clobetasol propionate, clonazepam, diclofenac, dicyclomine, docusate sodium, escitalopram, esomeprazole, [START ON 12/23/2022] estradiol, fluticasone, gabapentin, hydroxyzine, ibuprofen, lidocaine-prilocaine, magic mouthwash, melatonin, meloxicam, montelukast, NONFORMULARY OR COMPOUNDED ITEM, polyethylene glycol,  prednisone, sumatriptan, tizanidine, tramadol, trazodone, and valacyclovir.   Allergies: Patient is allergic to sulfa antibiotics and alendronate.   Social History: Patient  reports that she quit smoking about 12 years ago. Her smoking use included cigarettes. She started smoking about 22 years ago. She has a 5 pack-year smoking history. She has never used smokeless tobacco. She reports that she does not drink alcohol and does not use drugs.      OBJECTIVE    POC Urine: Negative for signs of infection.  Physical Exam: Vitals:   12/22/22 1001  BP: 129/83  Pulse: 79   Gen: No apparent distress, A&O x 3.  Detailed Urogynecologic Evaluation:   Vaginal tissue is pale and has significant atrophy and dryness. Patient also has two small tears on the labial lip. No masses, bleeding, or signs of discharge inside the vaginal tract.    ASSESSMENT AND PLAN    [name] is a 62 y.o. with:  1. Vaginal atrophy   2. Vulvar pain   3. Other muscle spasm   4. Urinary frequency    Patient has significant vaginal atrophy on exam. We discussed the purpose of using vagina estrogen replacement therapy and how important it is in prevention of UTI and keeping the vaginal tract comfortable for intercourse. We also discussed the function of the vaginal folds (rugae). Estrace cream ordered for her today and encouraged her to use this nightly for two weeks and the twice weekly after. GoodRx coupon given,  Patient has a small area of irritation at the labial lip that she reports hurts to wipe. EMLA cream given for the irritation to assist while the skin heals.  Patient's pelvic floor muscles are tight but not well coordinated. She reports it is expensive for her to do pelvic floor PT. Will send her some support materials that may be helpful.  POC Urine negative for sings of infection today. Most likely her  irritation is from the vaginal atrophy.   Patient to follow up PRN.

## 2022-12-22 NOTE — Patient Instructions (Signed)
Use the EMLA cream for the micro-tears at the labial lips.   Use the estrogen cream every night for two weeks. You will use a blueberry sized amount onto the finger and into the vagina and around the clitoris and urethra. After the first two weeks you will go down to twice weekly use.

## 2022-12-24 ENCOUNTER — Other Ambulatory Visit: Payer: Self-pay | Admitting: Family Medicine

## 2022-12-28 ENCOUNTER — Ambulatory Visit: Payer: BC Managed Care – PPO | Admitting: Family Medicine

## 2022-12-30 NOTE — Progress Notes (Signed)
[facility]riela She[facility] [address] [place] [address] Phone: [phone] Subjective:    I'm seeing this patient by the request  of:  Kristian Covey, MD  CC: back and neck pain follow up   [id number]  Gabriela Shepard is a 62 y.o. female coming in with complaint of back and neck pain. OMT on 07/16/2022. Patient states that her back and neck are not good "shitty". Isnt able to flex her finger and her ankles are swelling   Medications patient has been prescribed: meloxicam zanaflex  Taking:         Reviewed prior external information including notes and imaging from previsou exam, outside providers and external EMR if available.   As well as notes that were available from care everywhere and other healthcare systems.  Past medical history, social, surgical and family history all reviewed in electronic medical record.  No pertanent information unless stated regarding to the chief complaint.   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   History of UTI    Interstitial cystitis    Low sodium levels    Migraines    Osteoporosis    PONV (postoperative nausea and vomiting)    PTSD (post-traumatic stress disorder)    on meds   STD (sexually transmitted disease)    Substance abuse (HCC)    hx of alcohol   Urine incontinence     Allergies  Allergen Reactions   Sulfa Antibiotics Hives   Alendronate Other (See Comments)     Review of Systems:  No headache, visual changes, nausea, vomiting, diarrhea, constipation, dizziness, abdominal pain, skin rash, fevers, chills, night sweats, weight loss, swollen lymph nodes, body aches,  joint swelling, chest pain, shortness of breath, mood changes. POSITIVE muscle aches, body aches  Objective  Blood pressure 120/78, pulse 86, height 5\' 4"  (1.626 m), weight 153 lb (69.4 kg), last menstrual period 06/07/2010, SpO2 99%.   General: No apparent distress alert and oriented x3 mood and affect normal, dressed appropriately.  HEENT: Pupils equal, extraocular movements intact  Respiratory: Patient's speak in full sentences and does not appear short of breath  Cardiovascular: No lower extremity edema, non tender, no erythema  Neck exam significant tightness still noted.  Some limited sidebending bilaterally.  Does have mild tightness noted in the paraspinal musculature as well.  Multiple trigger points in the right shoulder region and some in the mildly lumbar region.  These include the rhomboid, trapezius and levator scapula.  After verbal consent patient prepped with alcohol swab and with a 25-gauge half inch needle injected in 4 distinct trigger points in the rhomboid, 2 in the trapezius and 1 in the levator scapula on the right shoulder region.  A total of 3 cc of 0.5% Marcaine and 1 cc of Kenalog 40 mg/mL used.  No blood loss.  Band-Aid placed.  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  Does have the tenderness in the back of the neck as well.  Attempted trigger points in the right shoulder region again and some in the lumbar spine.  Do think that this might be beneficial.  Continuing to get ablation and epidural injections by another provider.  Discussed which activities to do and which 1 is going to avoid other activities.  Follow-up with me again in  3 months otherwise.  Trigger point of right shoulder region Multiple trigger points noted again today.  Chronic problem with exacerbation.  Patient is on multiple different medications and do not think we need to continue the same additional medications.  Continue to be active otherwise.  Follow-up with me again in 3 months.  Worsening pain seek medical attention.  Fibromyalgia Do believe that the fibromyalgia also playing a role and we will need to continue to monitor.  Patient has seen rheumatology and rheumatology does not feel the positive ANA changes medical management at this time    Nonallopathic problems  Decision today to treat with OMT was based on Physical Exam  After verbal consent patient was treated with , ME, FPR techniques in cervical, rib, thoracic, lumbar, and sacral  areas  Patient tolerated the procedure well with improvement in symptoms  Patient given exercises, stretches and lifestyle modifications  See medications in patient instructions if given  Patient will follow up in 4-8 weeks     The above documentation has been reviewed and is accurate and complete Judi Saa, DO         Note: This dictation was prepared with Dragon dictation along with smaller phrase technology. Any transcriptional errors that result from this process are unintentional.

## 2023-01-05 ENCOUNTER — Encounter: Payer: Self-pay | Admitting: Family Medicine

## 2023-01-05 ENCOUNTER — Other Ambulatory Visit: Payer: Self-pay

## 2023-01-05 ENCOUNTER — Ambulatory Visit: Payer: BC Managed Care – PPO | Admitting: Family Medicine

## 2023-01-05 VITALS — BP 120/78 | HR 86 | Ht 64.0 in | Wt 153.0 lb

## 2023-01-05 DIAGNOSIS — M9902 Segmental and somatic dysfunction of thoracic region: Secondary | ICD-10-CM | POA: Diagnosis not present

## 2023-01-05 DIAGNOSIS — M797 Fibromyalgia: Secondary | ICD-10-CM | POA: Diagnosis not present

## 2023-01-05 DIAGNOSIS — M9908 Segmental and somatic dysfunction of rib cage: Secondary | ICD-10-CM | POA: Diagnosis not present

## 2023-01-05 DIAGNOSIS — M2550 Pain in unspecified joint: Secondary | ICD-10-CM

## 2023-01-05 DIAGNOSIS — M533 Sacrococcygeal disorders, not elsewhere classified: Secondary | ICD-10-CM | POA: Diagnosis not present

## 2023-01-05 DIAGNOSIS — M9903 Segmental and somatic dysfunction of lumbar region: Secondary | ICD-10-CM

## 2023-01-05 DIAGNOSIS — M9901 Segmental and somatic dysfunction of cervical region: Secondary | ICD-10-CM | POA: Diagnosis not present

## 2023-01-05 DIAGNOSIS — M9904 Segmental and somatic dysfunction of sacral region: Secondary | ICD-10-CM

## 2023-01-05 DIAGNOSIS — M25511 Pain in right shoulder: Secondary | ICD-10-CM | POA: Diagnosis not present

## 2023-01-05 NOTE — Patient Instructions (Addendum)
Trigger point injections today Injected finger See me again in 3 months

## 2023-01-05 NOTE — Assessment & Plan Note (Signed)
Chronic problem with exacerbation.  Does have the tenderness in the back of the neck as well.  Attempted trigger points in the right shoulder region again and some in the lumbar spine.  Do think that this might be beneficial.  Continuing to get ablation and epidural injections by another provider.  Discussed which activities to do and which 1 is going to avoid other activities.  Follow-up with me again in 3 months otherwise.

## 2023-01-05 NOTE — Assessment & Plan Note (Signed)
Multiple trigger points noted again today.  Chronic problem with exacerbation.  Patient is on multiple different medications and do not think we need to continue the same additional medications.  Continue to be active otherwise.  Follow-up with me again in 3 months.  Worsening pain seek medical attention.

## 2023-01-05 NOTE — Assessment & Plan Note (Signed)
Do believe that the fibromyalgia also playing a role and we will need to continue to monitor.  Patient has seen rheumatology and rheumatology does not feel the positive ANA changes medical management at this time

## 2023-01-06 ENCOUNTER — Encounter: Payer: Self-pay | Admitting: Family Medicine

## 2023-01-07 MED ORDER — ESTRADIOL 10 MCG VA TABS
1.0000 | ORAL_TABLET | VAGINAL | 3 refills | Status: DC
Start: 2023-01-10 — End: 2024-01-06

## 2023-01-07 MED ORDER — CYCLOBENZAPRINE HCL 5 MG PO TABS: 5.0000 mg | ORAL_TABLET | Freq: Every day | ORAL | 1 refills | Status: DC

## 2023-01-10 ENCOUNTER — Other Ambulatory Visit: Payer: Self-pay | Admitting: Family Medicine

## 2023-01-31 ENCOUNTER — Encounter: Payer: Self-pay | Admitting: Family Medicine

## 2023-01-31 ENCOUNTER — Other Ambulatory Visit: Payer: Self-pay | Admitting: Family Medicine

## 2023-02-01 ENCOUNTER — Encounter: Payer: Self-pay | Admitting: Family Medicine

## 2023-02-01 MED ORDER — ESCITALOPRAM OXALATE 20 MG PO TABS: 20.0000 mg | ORAL_TABLET | Freq: Every day | ORAL | 0 refills | Status: DC

## 2023-02-24 ENCOUNTER — Other Ambulatory Visit: Payer: Self-pay | Admitting: Family Medicine

## 2023-03-02 ENCOUNTER — Other Ambulatory Visit (HOSPITAL_COMMUNITY): Payer: Self-pay

## 2023-03-14 DIAGNOSIS — F4310 Post-traumatic stress disorder, unspecified: Secondary | ICD-10-CM | POA: Diagnosis not present

## 2023-03-25 DIAGNOSIS — M79641 Pain in right hand: Secondary | ICD-10-CM | POA: Diagnosis not present

## 2023-03-25 DIAGNOSIS — M25572 Pain in left ankle and joints of left foot: Secondary | ICD-10-CM | POA: Diagnosis not present

## 2023-03-25 DIAGNOSIS — M542 Cervicalgia: Secondary | ICD-10-CM | POA: Diagnosis not present

## 2023-03-31 NOTE — Progress Notes (Unsigned)
[name] [address] [place] [address] Phone: [phone] Subjective:   Bruce Donath, am serving as a scribe for Dr. Antoine Primas.  I'm seeing this patient by the request  of:  Kristian Covey, MD  CC: Back and neck pain follow-up  [id number]  Gabriela Shepard is a 62 y.o. female coming in with complaint of back and neck pain. OMT on 01/05/2023. Patient states that injection in middle finger last visit and would like injection again today.   Patient lost her mother and is carrying a lot of stress in traps. Would like trigger point injections.   Also fell and feels like she injured pinky toe. Cannot stand any pressure over the toe.   Also c/o nausea and pain in distal sternum. Eating does not increase her symptoms.   Also c/o L wrist pain from the fall.     Medications patient has been prescribed: Flexeril  Taking:         Reviewed prior external information including notes and imaging from previsou exam, outside providers and external EMR if available.   As well as notes that were available from care everywhere and other healthcare systems.  Past medical history, social, surgical and family history all reviewed in electronic medical record.  No pertanent information unless stated regarding to the chief complaint.   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   History of UTI    Interstitial cystitis    Low sodium levels    Migraines    Osteoporosis    PONV (postoperative nausea and vomiting)    PTSD (post-traumatic stress disorder)    on meds   STD (sexually transmitted disease)    Substance  abuse (HCC)    hx of alcohol   Urine incontinence     Allergies  Allergen Reactions   Sulfa Antibiotics Hives   Alendronate Other (See Comments)     Review of Systems:  No headache, visual changes, nausea, vomiting, diarrhea, constipation, dizziness, abdominal pain, skin rash, fevers, chills, night sweats, weight loss, swollen lymph nodes, body aches, joint swelling, chest pain, shortness of breath, mood changes. POSITIVE muscle aches  Objective  Blood pressure 100/62, pulse 64, height 5\' 4"  (1.626 m), weight 153 lb (69.4 kg), last menstrual period 06/07/2010, SpO2 96%.   General: No apparent distress alert and oriented x3 mood and affect normal, dressed appropriately.  HEENT: Pupils equal, extraocular movements intact  Respiratory: Patient's speak in full sentences and does not appear short of breath  Cardiovascular: No lower extremity edema, non tender, no erythema  Significant tightness in the back noted.  More tightness on the right shoulder area with multiple trigger points noted. Patient's left wrist does have good range of motion noted.  Good grip strength.  Mildly painful over the distal radius but overall does not seem to have any significant difficulty.  Patient though on abdominal exam does have severe tenderness in the epigastric area with some guarding noted.  Osteopathic findings  C2 flexed rotated and side bent right C7  flexed rotated and side bent left T3 extended rotated and side bent right inhaled rib T9 extended rotated and side bent left L2 flexed rotated and side bent right Sacrum right on right   After verbal consent patient was prepped with alcohol swab and with a 25-gauge half inch needle injected in 4 distinct trigger points in the rhomboid, trapezius, and levator scapula.  Minimal blood loss, Band-Aid placed.  Postinjection instructions given.    Assessment and Plan:  Trigger point of right shoulder region Repeat injections given again today.   Tolerated the procedure well.  I do think that there is some increasing discomfort and pain secondary to some increasing in stress with the loss of her mother recently.  Discussed different medications but patient would like to avoid them.  Left wrist pain Minimal discomfort both over the scaphoid bone.  X-rays ordered, thumb spica for 2 weeks.  Will start range of motion exercises otherwise.  Follow-up again in 4 to 6 weeks  Pain, upper back Pain in the upper back as well as abdominal area and with patient's increasing stress and will significantly concern for underlying small ulcer.  Discussed following up with primary care.  Omeprazole prescribed 2 times a day for at least the next 2 weeks.  Patient notes if worsening symptoms to seek medical attention.    Nonallopathic problems  Decision today to treat with OMT was based on Physical Exam  After verbal consent patient was treated with HVLA, ME, FPR techniques in cervical, rib, thoracic, lumbar, and sacral  areas  Patient tolerated the procedure well with improvement in symptoms  Patient given exercises, stretches and lifestyle modifications  See medications in patient instructions if given  Patient will follow up in 4-8 weeks     The above documentation has been reviewed and is accurate and complete Judi Saa, DO         Note: This dictation was prepared with Dragon dictation along with smaller phrase technology. Any transcriptional errors that result from this process are unintentional.

## 2023-04-06 ENCOUNTER — Ambulatory Visit: Payer: BC Managed Care – PPO | Admitting: Family Medicine

## 2023-04-06 ENCOUNTER — Ambulatory Visit (INDEPENDENT_AMBULATORY_CARE_PROVIDER_SITE_OTHER): Payer: BC Managed Care – PPO

## 2023-04-06 VITALS — BP 100/62 | HR 64 | Ht 64.0 in | Wt 153.0 lb

## 2023-04-06 DIAGNOSIS — M19072 Primary osteoarthritis, left ankle and foot: Secondary | ICD-10-CM | POA: Diagnosis not present

## 2023-04-06 DIAGNOSIS — M9904 Segmental and somatic dysfunction of sacral region: Secondary | ICD-10-CM

## 2023-04-06 DIAGNOSIS — M25511 Pain in right shoulder: Secondary | ICD-10-CM

## 2023-04-06 DIAGNOSIS — M9902 Segmental and somatic dysfunction of thoracic region: Secondary | ICD-10-CM | POA: Diagnosis not present

## 2023-04-06 DIAGNOSIS — M546 Pain in thoracic spine: Secondary | ICD-10-CM | POA: Diagnosis not present

## 2023-04-06 DIAGNOSIS — M25532 Pain in left wrist: Secondary | ICD-10-CM

## 2023-04-06 DIAGNOSIS — M9908 Segmental and somatic dysfunction of rib cage: Secondary | ICD-10-CM

## 2023-04-06 DIAGNOSIS — M9901 Segmental and somatic dysfunction of cervical region: Secondary | ICD-10-CM

## 2023-04-06 DIAGNOSIS — M7732 Calcaneal spur, left foot: Secondary | ICD-10-CM | POA: Diagnosis not present

## 2023-04-06 DIAGNOSIS — M19032 Primary osteoarthritis, left wrist: Secondary | ICD-10-CM | POA: Diagnosis not present

## 2023-04-06 DIAGNOSIS — M9903 Segmental and somatic dysfunction of lumbar region: Secondary | ICD-10-CM

## 2023-04-06 DIAGNOSIS — M79675 Pain in left toe(s): Secondary | ICD-10-CM

## 2023-04-06 DIAGNOSIS — M549 Dorsalgia, unspecified: Secondary | ICD-10-CM

## 2023-04-06 DIAGNOSIS — M79672 Pain in left foot: Secondary | ICD-10-CM | POA: Diagnosis not present

## 2023-04-06 MED ORDER — OMEPRAZOLE 40 MG PO CPDR: 40.0000 mg | DELAYED_RELEASE_CAPSULE | Freq: Two times a day (BID) | ORAL | 1 refills | Status: DC

## 2023-04-06 NOTE — Patient Instructions (Addendum)
Sorry for your loss  Trigger point injections today Injection in finger today Brace day and night for 2 weeks Then only at night for another 2 weeks You have 14 days to return or exchange your brace Call [phone], then return the brace to our office Prilosec 2 times a day for next 2 weeks If stomach pain worsens please seek medical attention  See me again in 8 weeks

## 2023-04-07 ENCOUNTER — Encounter: Payer: Self-pay | Admitting: Family Medicine

## 2023-04-07 DIAGNOSIS — M25532 Pain in left wrist: Secondary | ICD-10-CM | POA: Insufficient documentation

## 2023-04-07 NOTE — Assessment & Plan Note (Signed)
Repeat injections given again today.  Tolerated the procedure well.  I do think that there is some increasing discomfort and pain secondary to some increasing in stress with the loss of her mother recently.  Discussed different medications but patient would like to avoid them.

## 2023-04-07 NOTE — Assessment & Plan Note (Signed)
Minimal discomfort both over the scaphoid bone.  X-rays ordered, thumb spica for 2 weeks.  Will start range of motion exercises otherwise.  Follow-up again in 4 to 6 weeks

## 2023-04-07 NOTE — Assessment & Plan Note (Signed)
Pain in the upper back as well as abdominal area and with patient's increasing stress and will significantly concern for underlying small ulcer.  Discussed following up with primary care.  Omeprazole prescribed 2 times a day for at least the next 2 weeks.  Patient notes if worsening symptoms to seek medical attention.

## 2023-04-11 ENCOUNTER — Encounter: Payer: Self-pay | Admitting: Family Medicine

## 2023-04-11 ENCOUNTER — Ambulatory Visit: Payer: BC Managed Care – PPO | Admitting: Family Medicine

## 2023-04-11 VITALS — BP 136/80 | HR 63 | Temp 98.2°F | Ht 64.0 in | Wt 148.0 lb

## 2023-04-11 DIAGNOSIS — R110 Nausea: Secondary | ICD-10-CM

## 2023-04-11 DIAGNOSIS — R1013 Epigastric pain: Secondary | ICD-10-CM

## 2023-04-11 MED ORDER — PROMETHAZINE HCL 25 MG PO TABS: 25.0000 mg | ORAL_TABLET | Freq: Three times a day (TID) | ORAL | 0 refills | Status: DC | PRN

## 2023-04-11 NOTE — Patient Instructions (Signed)
Continue with the Omeprazole 40 mg twice daily.  Follow up for any recurrent vomiting, melena, or progressive abdominal pain.

## 2023-04-11 NOTE — Progress Notes (Signed)
Established Patient Office Visit  Subjective   Patient ID: Gabriela Shepard, female    DOB: 06-06-1961  Age: 62 y.o. MRN: [id number]  Chief Complaint  Patient presents with   Abdominal Pain    Patient complains of abdominal pain, x2 months   Fatigue   Stress   Diarrhea    HPI   Gabriela Shepard is seen with abdominal pain near the midline epigastric area over the past several weeks.  Gabriela Shepard has had tremendous stress recently.  Gabriela Shepard was out in [place] visiting her son whose had some mental illness issues and agitation.  After strained relationship with him Gabriela Shepard returned back here and 5 days later her mom died at age 11.  Funeral service will be this Saturday.  Gabriela Shepard does have history of a lot of musculoskeletal complaints and has been taking some sporadic nonsteroidals such as ibuprofen and meloxicam but not daily irregularly.  Gabriela Shepard has had recent increase symptoms of fatigue, headaches, and nausea.  Gabriela Shepard had some increased acidity and was recently started back on Prilosec currently 40 mg twice daily.  Gabriela Shepard has had some black stools but intermittently.  No Pepto-Bismol use.  No hematemesis.  Her concern was possible peptic ulcer.  No known history of H. pylori.  Additionally, Gabriela Shepard has had some intermittent loose stools.  Her nausea has been fairly pronounced.  Gabriela Shepard had some Zofran which did not seem to help much.  Gabriela Shepard states Gabriela Shepard has had better success with Phenergan in the past.  Has history of alcohol use but has been clean for years.  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   History of UTI    Interstitial cystitis    Low sodium levels    Migraines    Osteoporosis    PONV  (postoperative nausea and vomiting)    PTSD (post-traumatic stress disorder)    on meds   STD (sexually transmitted disease)    Substance abuse (HCC)    hx of alcohol   Urine incontinence    Past Surgical History:  Procedure Laterality Date   BREAST BIOPSY Left    2017 benign x 2   CESAREAN SECTION  1992   COLONOSCOPY  2018   JMP-MAC-suprep(good)-polyps   INCONTINENCE SURGERY     LAPAROSCOPY     under bladder   NECK SURGERY  02/24/2018   ACDF   POLYPECTOMY  2018   polyps removed   TOTAL HIP ARTHROPLASTY Right 09/08/2020   TOTAL HIP ARTHROPLASTY Left 2018   TOTAL KNEE ARTHROPLASTY Left 12/23/2015   Procedure: TOTAL KNEE ARTHROPLASTY;  Surgeon: Marcene Corning, MD;  Location: [facility] OR;  Service: Orthopedics;  Laterality: Left;    reports that Gabriela Shepard quit smoking about 12 years ago. Her smoking use included cigarettes. Gabriela Shepard started smoking about 22 years ago. Gabriela Shepard has a 5 pack-year smoking history. Gabriela Shepard has never used smokeless tobacco. Gabriela Shepard reports that Gabriela Shepard does not drink alcohol and does not use drugs. family history includes Alcohol abuse in her brother and another family member; Alcoholism in her brother; Arthritis in an other  family member; Cancer (age of onset: 37) in her father; Drug abuse in her sister; Healthy in her daughter and son; Heart disease in her father; Hypertension in her maternal grandmother, mother, and another family member; Kidney cancer in her father; Lung cancer in her mother; Mental illness in an other family member; Ulcers in her father. Allergies  Allergen Reactions   Sulfa Antibiotics Hives   Alendronate Other (See Comments)    Review of Systems  Constitutional:  Negative for chills, fever and weight loss.  Respiratory:  Negative for cough.   Cardiovascular:  Negative for chest pain.  Gastrointestinal:  Positive for abdominal pain, diarrhea, heartburn and nausea. Negative for vomiting.      Objective:     BP 136/80 (BP Location: Left Arm, Patient Position:  Sitting, Cuff Size: Normal)   Pulse 63   Temp 98.2 F (36.8 C) (Oral)   Ht 5\' 4"  (1.626 m)   Wt 148 lb (67.1 kg)   LMP 06/07/2010 (Approximate)   SpO2 98%   BMI 25.40 kg/m  BP Readings from Last 3 Encounters:  04/11/23 136/80  04/06/23 100/62  01/05/23 120/78   Wt Readings from Last 3 Encounters:  04/11/23 148 lb (67.1 kg)  04/06/23 153 lb (69.4 kg)  01/05/23 153 lb (69.4 kg)      Physical Exam Vitals reviewed.  Constitutional:      General: Gabriela Shepard is not in acute distress.    Appearance: Gabriela Shepard is well-developed. Gabriela Shepard is not ill-appearing.  Cardiovascular:     Rate and Rhythm: Normal rate and regular rhythm.  Pulmonary:     Effort: Pulmonary effort is normal.     Breath sounds: Normal breath sounds.  Abdominal:     General: There is no distension.     Comments: Nondistended.  Normal bowel sounds.  Soft with some mild epigastric tenderness.  No guarding or rebound.  No masses palpated.  Rectal exam reveals brownish stool which is Hemoccult negative.  Neurological:     Mental Status: Gabriela Shepard is alert.      No results found for any visits on 04/11/23.    The 10-year ASCVD risk score (Arnett DK, et al., 2019) is: 3.9%    Assessment & Plan:   Problem List Items Addressed This Visit   None Visit Diagnoses     Abdominal pain, acute, epigastric    -  Primary   Relevant Orders   CBC with Differential/Platelet   Lipase     Gabriela Shepard presents with several week history of some epigastric abdominal pain along with increased nausea and fatigue.  Several stress issues as above.  Gabriela Shepard also has some increased risk for ulcer with nonsteroidal use.  No alcohol.  Gabriela Shepard describes some black stools intermittently but Hemoccult negative today.  We discussed the following  -Avoid all non-steroidals -Continue Prilosec 40 mg twice daily -Check labs including lipase and CBC -If symptoms not promptly improving with omeprazole will need further evaluation with either upper GI or GI referral for  possible endoscopy -Discussed management of GERD and dietary things to watch out for -Agreed to send in some limited Phenergan 25 mg every 8 hours as needed for nausea.  Gabriela Shepard is aware this may be sedating.  No follow-ups on file.    Gabriela Peat, MD

## 2023-04-12 LAB — CBC WITH DIFFERENTIAL/PLATELET
Basophils Absolute: 0.1 10*3/uL (ref 0.0–0.1)
Basophils Relative: 1.2 % (ref 0.0–3.0)
Eosinophils Absolute: 0 10*3/uL (ref 0.0–0.7)
Eosinophils Relative: 0.4 % (ref 0.0–5.0)
HCT: 38.4 % (ref 36.0–46.0)
Hemoglobin: 12.2 g/dL (ref 12.0–15.0)
Lymphocytes Relative: 22.2 % (ref 12.0–46.0)
Lymphs Abs: 2.3 10*3/uL (ref 0.7–4.0)
MCHC: 31.8 g/dL (ref 30.0–36.0)
MCV: 90.6 fL (ref 78.0–100.0)
Monocytes Absolute: 0.9 10*3/uL (ref 0.1–1.0)
Monocytes Relative: 9.1 % (ref 3.0–12.0)
Neutro Abs: 7 10*3/uL (ref 1.4–7.7)
Neutrophils Relative %: 67.1 % (ref 43.0–77.0)
Platelets: 385 10*3/uL (ref 150.0–400.0)
RBC: 4.23 Mil/uL (ref 3.87–5.11)
RDW: 13.8 % (ref 11.5–15.5)
WBC: 10.4 10*3/uL (ref 4.0–10.5)

## 2023-04-12 LAB — LIPASE: Lipase: 39 U/L (ref 11.0–59.0)

## 2023-04-20 ENCOUNTER — Encounter: Payer: Self-pay | Admitting: Family Medicine

## 2023-04-20 DIAGNOSIS — R1013 Epigastric pain: Secondary | ICD-10-CM

## 2023-04-30 ENCOUNTER — Other Ambulatory Visit: Payer: Self-pay | Admitting: Family Medicine

## 2023-05-02 ENCOUNTER — Other Ambulatory Visit: Payer: Self-pay | Admitting: Family Medicine

## 2023-05-09 ENCOUNTER — Other Ambulatory Visit: Payer: Self-pay | Admitting: Family Medicine

## 2023-05-09 MED ORDER — CYCLOBENZAPRINE HCL 5 MG PO TABS: 5.0000 mg | ORAL_TABLET | Freq: Every evening | ORAL | 0 refills | Status: DC

## 2023-05-10 DIAGNOSIS — M47812 Spondylosis without myelopathy or radiculopathy, cervical region: Secondary | ICD-10-CM | POA: Diagnosis not present

## 2023-05-10 DIAGNOSIS — M47816 Spondylosis without myelopathy or radiculopathy, lumbar region: Secondary | ICD-10-CM | POA: Diagnosis not present

## 2023-05-17 ENCOUNTER — Encounter: Payer: Self-pay | Admitting: Family Medicine

## 2023-05-17 DIAGNOSIS — R1013 Epigastric pain: Secondary | ICD-10-CM

## 2023-05-18 NOTE — Telephone Encounter (Signed)
Urgent referral placed

## 2023-05-23 IMAGING — MR MR HEAD WO/W CM
13 series · 48 of 48 positions shown · IV contrast (multihance)
Comparison: MRI head without contrast 06/22/2016

CLINICAL DATA: History of meningioma.  Follow-up brain lesion.

EXAM:
MRI HEAD WITHOUT AND WITH CONTRAST
TECHNIQUE: Multiplanar, multiecho pulse sequences of the brain and surrounding
structures were obtained without and with intravenous contrast.
CONTRAST:  12mL MULTIHANCE GADOBENATE DIMEGLUMINE 529 MG/ML IV SOLN

[Series 2: T1 · sagittal · 5.0mm · 0.45mm/px · 1 of 23 slices shown]
[im 1/23]
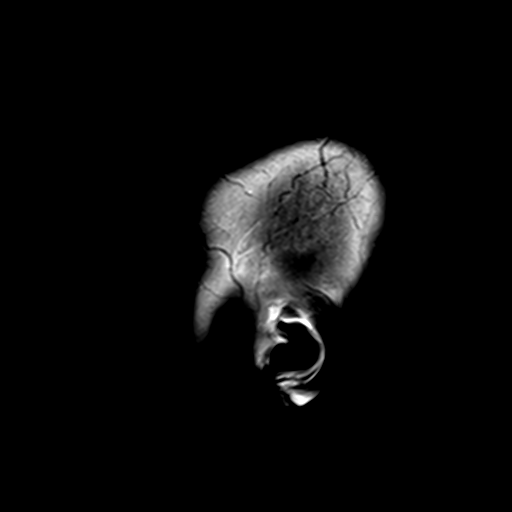

[Series 3: DWI · axial · 3.0mm · 1.80mm/px · z∈[-66,+80]mm · 7 of 100 slices shown (1 of 4)]
[im 1/100]
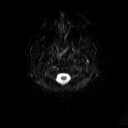
[im 17/100]
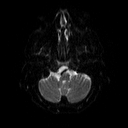
[im 34/100]
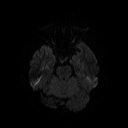
[im 50/100]
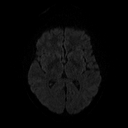
[im 67/100]
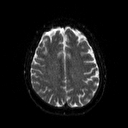
[im 83/100]
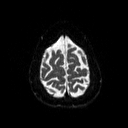
[im 100/100]
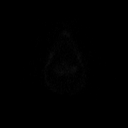

[Series 4: DWI · axial · 3.0mm · 1.80mm/px · z∈[-66,+80]mm · 3 of 47 slices shown (2 of 4)]
[im 1/47]
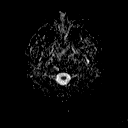
[im 24/47]
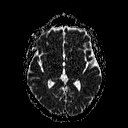
[im 47/47]
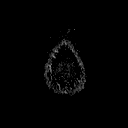

[Series 5: DWI · coronal · 5.0mm · 1.80mm/px · 4 of 66 slices shown (3 of 4)]
[im 1/66]
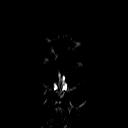
[im 22/66]
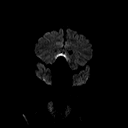
[im 44/66]
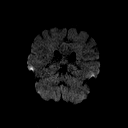
[im 66/66]
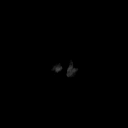

[Series 6: DWI · coronal · 5.0mm · 1.80mm/px · 2 of 34 slices shown (4 of 4)]
[im 1/34]
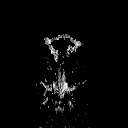
[im 34/34]
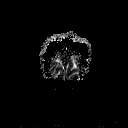

[Series 7: T2 · axial · 5.0mm · 0.60mm/px · 1 of 22 slices shown (1 of 2)]
[im 1/22]
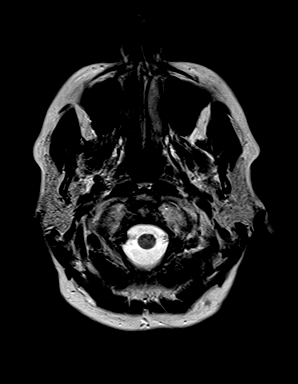

[Series 8: FLAIR · axial · 3.0mm · 0.45mm/px · z∈[-65,+79]mm · 2 of 32 slices shown]
[im 1/32]
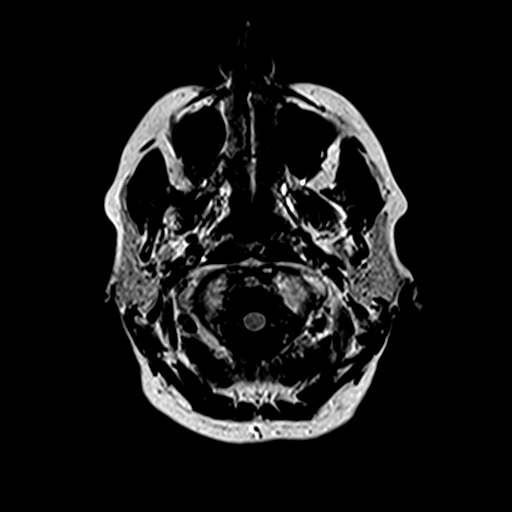
[im 32/32]
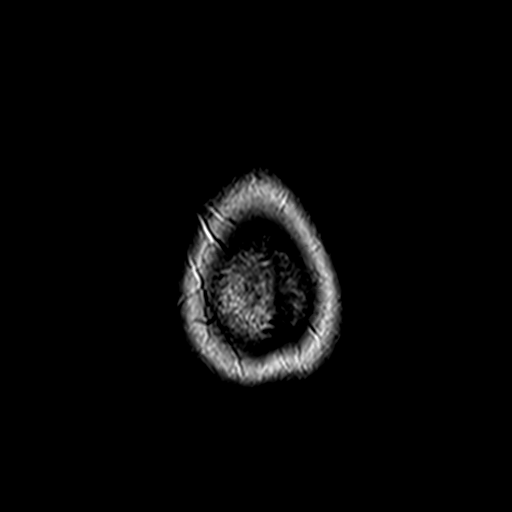

[Series 10: swi_images · axial · 4.0mm · 0.90mm/px · z∈[-63,+77]mm · 2 of 36 slices shown]
[im 1/36]
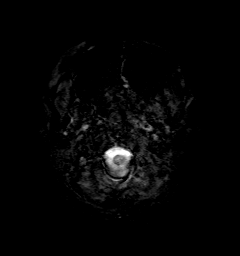
[im 36/36]
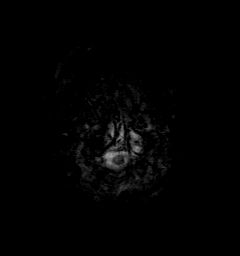

[Series 11: t1_mpr_tra · axial · 1.0mm · 0.75mm/px · z∈[-65,+78]mm · 10 of 144 slices shown]
[im 1/144]
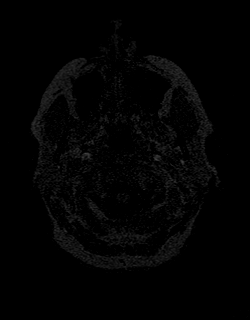
[im 16/144]
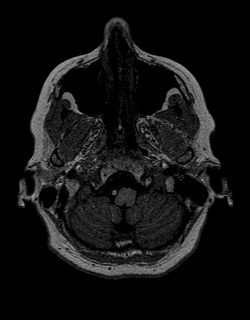
[im 32/144]
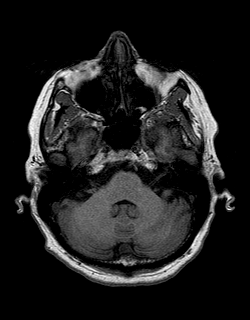
[im 48/144]
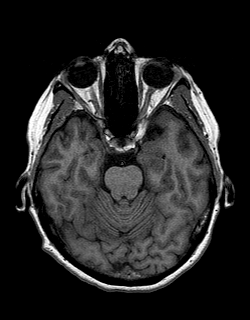
[im 64/144]
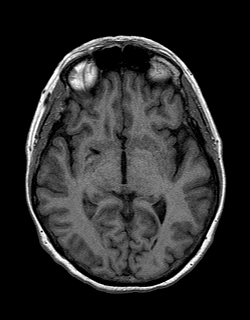
[im 80/144]
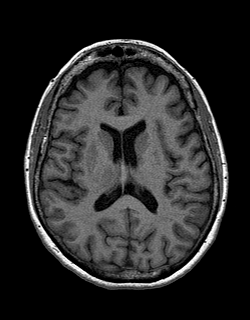
[im 96/144]
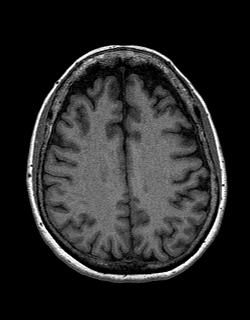
[im 112/144]
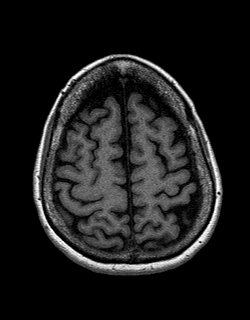
[im 128/144]
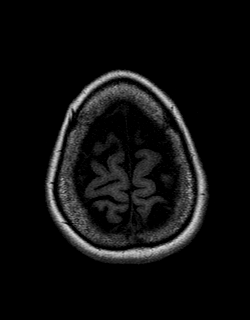
[im 144/144]
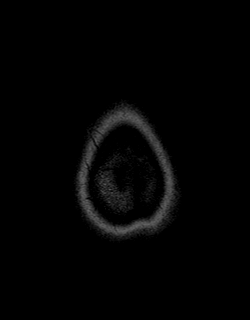

[Series 12: T2 · coronal · 5.0mm · 0.45mm/px · 2 of 25 slices shown (2 of 2)]
[im 1/25]
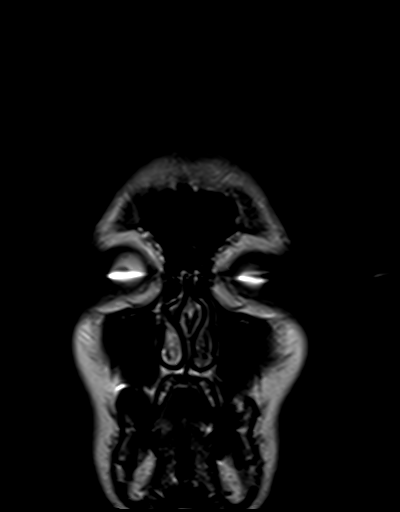
[im 25/25]
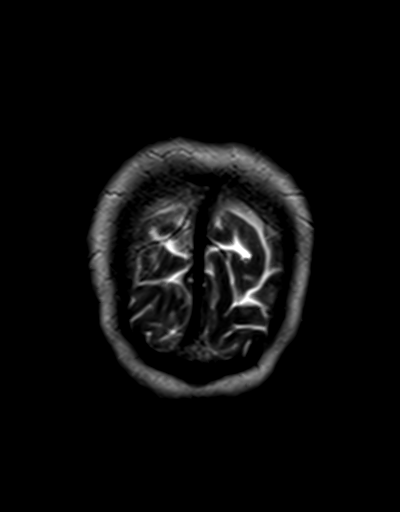

[Series 13: t1_mpr_tra post · axial · 1.0mm · 0.75mm/px · z∈[-65,+78]mm · 10 of 144 slices shown]
[im 1/144]
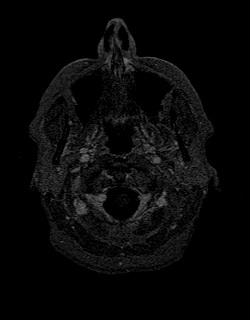
[im 16/144]
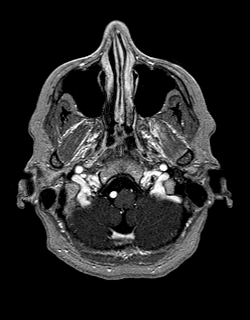
[im 32/144]
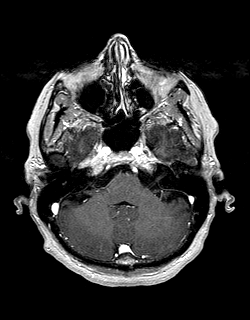
[im 48/144]
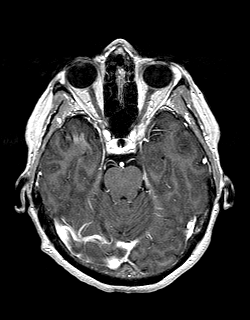
[im 64/144]
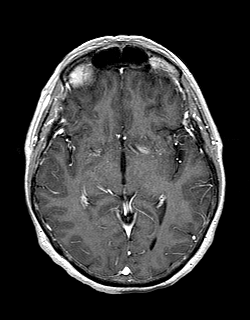
[im 80/144]
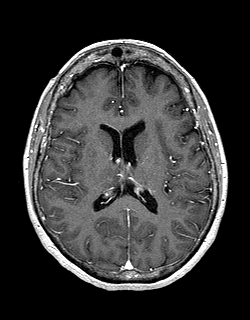
[im 96/144]
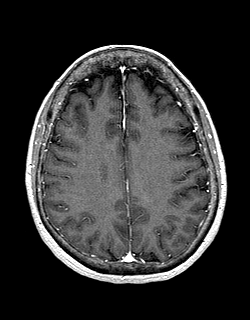
[im 112/144]
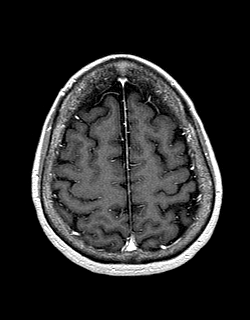
[im 128/144]
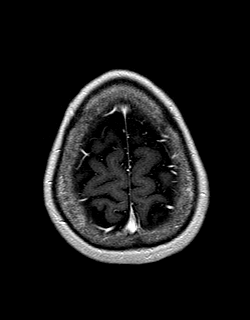
[im 144/144]
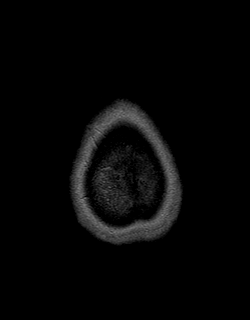

[Series 14: post cor · coronal · 5.0mm · 0.45mm/px · 2 of 25 slices shown]
[im 1/25]
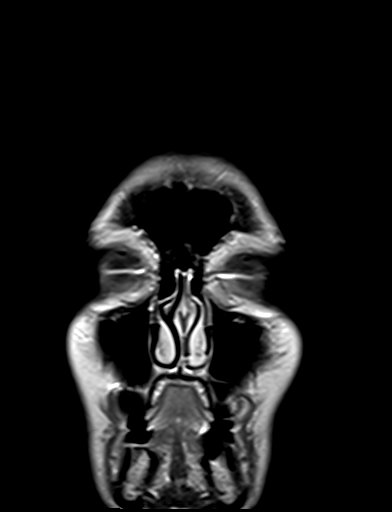
[im 25/25]
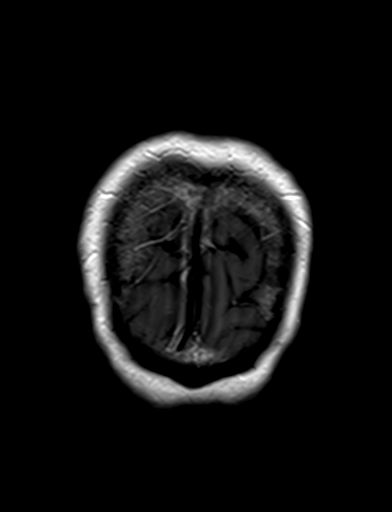

[Series 15: T1 post-contrast · sagittal · 5.0mm · 0.45mm/px · 2 of 23 slices shown]
[im 1/23]
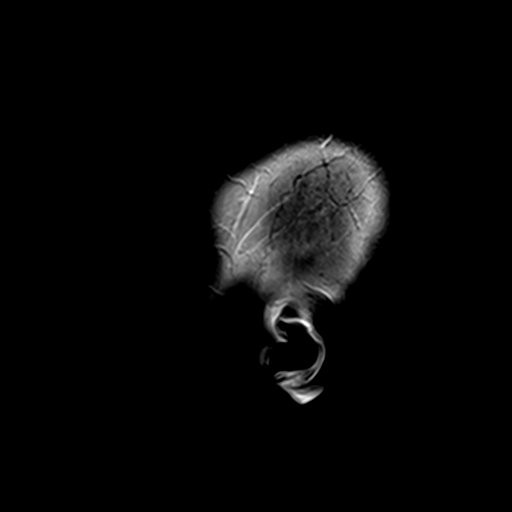
[im 23/23]
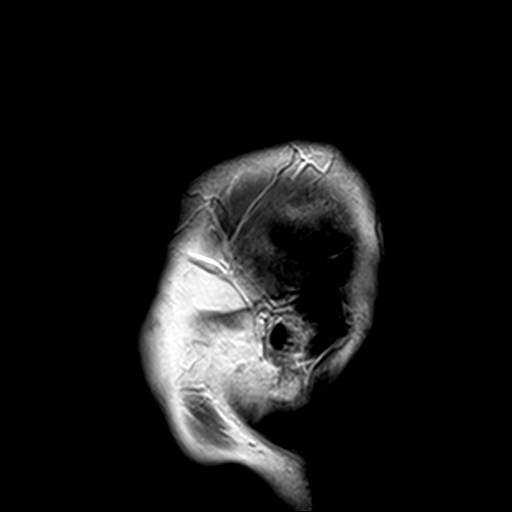

[48 of 48 positions shown; findings below may reference images not displayed]

FINDINGS: Brain: 4 mm dural-based lesion over the left medial posterior
frontal convexity is stable. The lesion is hyperintense on T1 prior
to contrast infusion and hypointense on T2. No definite enhancement
but enhancement would be difficult to determine given the intrinsic
T1 hyperintensity. No mass-effect on the brain. No other lesions
identified of concern.

Ventricle size normal. Few small white matter hyperintensities
bilaterally show mild progression since 5065. Negative for acute
infarct or hemorrhage.

Vascular: Normal arterial flow voids

Skull and upper cervical spine: Negative

Sinuses/Orbits: Negative

Other: None
IMPRESSION: 4 mm dural based lesion left posterior frontal convexity is stable.
This shows intrinsic T1 hyperintensity suggesting fat. Favor benign
dural calcification. Meningioma also possible. No mass-effect on the
brain

Mild chronic microvascular ischemic change with progression since

## 2023-05-23 NOTE — Progress Notes (Signed)
[facility] [address] [place] [address] Phone: [phone]   Assessment and Plan:     1. Neck pain 2. Somatic dysfunction of cervical region 3. Somatic dysfunction of thoracic region 4. Somatic dysfunction of lumbar region 5. Somatic dysfunction of pelvic region 6. Somatic dysfunction of rib region -Chronic with exacerbation, subsequent visit - Recurrent flare and neck pain most consistent with strain of right sided cervical paraspinal, SCM and right trapezius - Do not recommend NSAIDs due to stomach ulcer - Tomorrow, start prednisone Dosepak - Patient elected for IM injection of methylprednisone 80 mg/Toradol 60 mg.  Injection given in clinic today and tolerated well. - Start HEP for neck - Patient has received relief with OMT in the past.  Elects for repeat OMT today.  Tolerated well per note below. - Decision today to treat with OMT was based on Physical Exam  After verbal consent patient was treated with HVLA (high velocity low amplitude), ME (muscle energy), FPR (flex positional release), ST (soft tissue), PC/PD (Pelvic Compression/ Pelvic Decompression) techniques in cervical, rib, thoracic, lumbar, and pelvic areas. Patient tolerated the procedure well with improvement in symptoms.  Patient educated on potential side effects of soreness and recommended to rest, hydrate, and use Tylenol as needed for pain control.   15 additional minutes spent for educating Therapeutic Home Exercise Program.  This included exercises focusing on stretching, strengthening, with focus on eccentric aspects.   Long term goals include an improvement in range of motion, strength, endurance as well as avoiding reinjury. Patient's frequency would include in 1-2 times a day, 3-5 times a week for a duration of 6-12 weeks. Proper technique shown and discussed handout in great detail with ATC.  All questions were discussed and answered.    Pertinent  previous records reviewed include none  Follow Up: With Dr. Katrinka Blazing for regular follow-up visits, or with myself sooner if no relief.  Could consider repeat OMT versus trigger point injections   Subjective:   I, Gabriela Shepard, am serving as a Neurosurgeon for Doctor Richardean Sale  Chief Complaint: neck pain   HPI:  04/06/2023 Gabriela Shepard is a 62 y.o. female coming in with complaint of back and neck pain. OMT on 01/05/2023. Patient states that injection in middle finger last visit and would like injection again today.    Patient lost her mother and is carrying a lot of stress in traps. Would like trigger point injections.    Also fell and feels like she injured pinky toe. Cannot stand any pressure over the toe.    Also c/o nausea and pain in distal sternum. Eating does not increase her symptoms.    Also c/o L wrist pain from the fall.   05/24/2023 Patient states she has some neck swelling that is affecting her jaw and shoulder. Had a nerve ablation 2 weeks ago. Heat, ice and stretching have not helped. She is not ale to sleep or get comfortable . She is TTP . She is also treating an ulcer no  meds   Relevant Historical Information: None pertinent  Additional pertinent review of systems negative.   Current Outpatient Medications:    AIMOVIG 70 MG/ML SOAJ, INJECT 70 MG INTO THE SKIN EVERY 30 (THIRTY) DAYS., Disp: 3 mL, Rfl: 1   cetirizine (ZYRTEC) 10 MG tablet, Take 1 tablet (10 mg total) by mouth daily., Disp: 90 tablet, Rfl: 3   Cholecalciferol (VITAMIN D3) 1.25 MG (50000 UT) CAPS, Take  1 capsule (50,000 Units total) by mouth once a week. For bone health, Disp: 12 capsule, Rfl: 3   clobetasol cream (TEMOVATE) 0.05 %, Apply 1 Application topically 2 (two) times daily., Disp: 30 g, Rfl: 0   Clobetasol Propionate 0.05 % shampoo, Apply 1 Application topically daily as needed., Disp: 118 mL, Rfl: 0   clonazePAM (KLONOPIN) 1 MG tablet, TAKE 1 TABLET BY MOUTH TWICE A DAY AS NEEDED FOR  ANXIETY, Disp: 60 tablet, Rfl: 2   cyclobenzaprine (FLEXERIL) 5 MG tablet, Take 1 tablet (5 mg total) by mouth at bedtime., Disp: 30 tablet, Rfl: 1   cyclobenzaprine (FLEXERIL) 5 MG tablet, Take 1 tablet (5 mg total) by mouth at bedtime., Disp: 30 tablet, Rfl: 0   diclofenac (VOLTAREN) 75 MG EC tablet, TAKE ONE TABLET BY MOUTH WITH FOOD EVERY 12 HOURS AS NEEDED FOR ARTHRITIC PAIN, Disp: 60 tablet, Rfl: 0   dicyclomine (BENTYL) 10 MG capsule, Take 1 capsule (10 mg total) by mouth 3 (three) times daily before meals., Disp: 30 capsule, Rfl: 0   docusate sodium (COLACE) 100 MG capsule, Take 1 capsule (100 mg total) by mouth daily. For constipation, Disp: 30 capsule, Rfl: 0   escitalopram (LEXAPRO) 20 MG tablet, TAKE 1 TABLET BY MOUTH EVERY DAY, Disp: 90 tablet, Rfl: 0   Estradiol 10 MCG TABS vaginal tablet, Place 1 tablet (10 mcg total) vaginally 2 (two) times a week. Place 1 tab nightly for two weeks then twice a week after, Disp: 24 tablet, Rfl: 3   fluticasone (FLONASE) 50 MCG/ACT nasal spray, Place 2 sprays into both nostrils daily., Disp: 16 g, Rfl: 11   gabapentin (NEURONTIN) 300 MG capsule, Take one capsule by mouth at bedtime for 2 days and then increase to one po bid, Disp: 60 capsule, Rfl: 5   hydrOXYzine (ATARAX) 50 MG tablet, TAKE 1 TABLET BY MOUTH EVERYDAY AT BEDTIME, Disp: 90 tablet, Rfl: 4   lidocaine-prilocaine (EMLA) cream, Apply 1 Application topically as needed., Disp: 30 g, Rfl: 0   magic mouthwash SOLN, use 1-2 tsps swish, gargle, and spit every 6 hours as needed, Disp: 120 mL, Rfl: 0   melatonin 5 MG TABS, Take 1 tablet (5 mg total) by mouth at bedtime. For sleep (Patient taking differently: Take 10 mg by mouth at bedtime. For sleep), Disp: 30 tablet, Rfl: 0   methylPREDNISolone (MEDROL DOSEPAK) 4 MG TBPK tablet, Take 6 tablets on day 1.  Take 5 tablets on day 2.  Take 4 tablets on day 3.  Take 3 tablets on day 4.  Take 2 tablets on day 5.  Take 1 tablet on day 6., Disp: 21 tablet,  Rfl: 0   montelukast (SINGULAIR) 10 MG tablet, Take 1 tablet (10 mg total) by mouth at bedtime., Disp: 90 tablet, Rfl: 3   NONFORMULARY OR COMPOUNDED ITEM, Amitriptyline 2.5%/ gabapentin 2.5%/ baclofen 2.5% in vaginal cream. Place 0.5g twice a day in vaginal/ vulvar area. Dispense 60g with 5 refills., Disp: 1 each, Rfl: 5   omeprazole (PRILOSEC) 40 MG capsule, Take 1 capsule (40 mg total) by mouth 2 (two) times daily. Take in the morning and at dinnertime., Disp: 60 capsule, Rfl: 1   polyethylene glycol (MIRALAX / GLYCOLAX) 17 g packet, Take 17 g by mouth daily. For constipation, Disp: 14 each, Rfl: 0   predniSONE (STERAPRED UNI-PAK 48 TAB) 5 MG (48) TBPK tablet, 12 day dosepack po, Disp: 48 tablet, Rfl: 0   promethazine (PHENERGAN) 25 MG tablet, Take 1 tablet (25  mg total) by mouth every 8 (eight) hours as needed for nausea or vomiting., Disp: 20 tablet, Rfl: 0   SUMAtriptan (IMITREX) 100 MG tablet, TAKE 1 TABLET AS NEEDED FOR MIGRAINES MAY REPEAT ONCE IN 2 HOURS AS NEEDED MAX 2 IN 24 HOURS., Disp: 9 tablet, Rfl: 2   traMADol (ULTRAM) 50 MG tablet, TAKE 1 TABLET BY MOUTH EVERY 6 HOURS AS NEEDED FOR UP TO 5 DAYS., Disp: 20 tablet, Rfl: 0   traZODone (DESYREL) 100 MG tablet, TAKE 1 TABLET BY MOUTH EVERY DAY AT BEDTIME FOR SLEEP, Disp: 90 tablet, Rfl: 3   valACYclovir (VALTREX) 500 MG tablet, TAKE 1 TABLET BY MOUTH TWICE A DAY FOR 3 DAYS AS NEEDED, Disp: 180 tablet, Rfl: 0   Objective:     Vitals:   05/24/23 0846  BP: 122/82  Pulse: 75  SpO2: 99%  Weight: 155 lb (70.3 kg)  Height: 5\' 4"  (1.626 m)      Body mass index is 26.61 kg/m.    Physical Exam:    Neck Exam: Cervical Spine- Posture normal Skin- normal, intact  Neuro:  Strength-  Right Left   Deltoid (C5) 5/5 5/5  Bicep/Brachioradialis (C5/6) 5/5  5/5  Wrist Extension (C6) 5/5 5/5  Tricep (C7) 5/5 5/5  Wrist Flexion (C7) 5/5 5/5  Grip (C8) 5/5 5/5  Finger Abduction (T1) 5/5 5/5   Sensation: intact to light touch in upper  extremities bilaterally  Spurling's:  negative bilaterally Neck ROM: Decreased left rotation and sidebending due to right sided tension TTP: Right cervical paraspinal, right trapezius, right SCM NTTP: cervical spinous processes, left cervical paraspinal, thoracic paraspinal, left trapezius     OMT Physical Exam:  ASIS Compression Test: Positive Right Cervical: TTP paraspinal, C3-6 RRSR Rib: Bilateral elevated first rib with TTP Thoracic: TTP paraspinal, T6-8 RRSL Lumbar: TTP paraspinal, L1-3 RRSL Pelvis: Right anterior innominate   Electronically signed by:  [facility] 9:46 AM 05/24/23

## 2023-05-24 ENCOUNTER — Ambulatory Visit: Payer: BC Managed Care – PPO | Admitting: Sports Medicine

## 2023-05-24 VITALS — BP 122/82 | HR 75 | Ht 64.0 in | Wt 155.0 lb

## 2023-05-24 DIAGNOSIS — M542 Cervicalgia: Secondary | ICD-10-CM | POA: Diagnosis not present

## 2023-05-24 DIAGNOSIS — M9905 Segmental and somatic dysfunction of pelvic region: Secondary | ICD-10-CM | POA: Diagnosis not present

## 2023-05-24 DIAGNOSIS — M9901 Segmental and somatic dysfunction of cervical region: Secondary | ICD-10-CM

## 2023-05-24 DIAGNOSIS — M9903 Segmental and somatic dysfunction of lumbar region: Secondary | ICD-10-CM

## 2023-05-24 DIAGNOSIS — M9902 Segmental and somatic dysfunction of thoracic region: Secondary | ICD-10-CM

## 2023-05-24 DIAGNOSIS — M9908 Segmental and somatic dysfunction of rib cage: Secondary | ICD-10-CM

## 2023-05-24 MED ORDER — METHYLPREDNISOLONE ACETATE 80 MG/ML IJ SUSP
80.0000 mg | Freq: Once | INTRAMUSCULAR | Status: AC
  Administered 2023-05-24: 80 mg via INTRAMUSCULAR

## 2023-05-24 MED ORDER — METHYLPREDNISOLONE 4 MG PO TBPK: ORAL_TABLET | ORAL | 0 refills | Status: DC

## 2023-05-24 MED ORDER — KETOROLAC TROMETHAMINE 60 MG/2ML IM SOLN
60.0000 mg | Freq: Once | INTRAMUSCULAR | Status: AC
  Administered 2023-05-24: 60 mg via INTRAMUSCULAR

## 2023-05-24 NOTE — Patient Instructions (Signed)
Prednisone dos pak starting tomorrow  Neck HEP  Follow up with Dr. Katrinka Blazing or with Dr. Jean Rosenthal if needed sooner

## 2023-05-26 ENCOUNTER — Encounter: Payer: Self-pay | Admitting: Sports Medicine

## 2023-05-27 ENCOUNTER — Other Ambulatory Visit: Payer: Self-pay | Admitting: Sports Medicine

## 2023-05-27 ENCOUNTER — Encounter: Payer: Self-pay | Admitting: Sports Medicine

## 2023-05-27 MED ORDER — CYCLOBENZAPRINE HCL 5 MG PO TABS: 5.0000 mg | ORAL_TABLET | Freq: Every day | ORAL | 0 refills | Status: DC

## 2023-05-27 NOTE — Progress Notes (Unsigned)
Flexeril sent in

## 2023-05-30 ENCOUNTER — Other Ambulatory Visit: Payer: Self-pay | Admitting: Sports Medicine

## 2023-05-30 ENCOUNTER — Encounter: Payer: Self-pay | Admitting: Sports Medicine

## 2023-05-30 MED ORDER — CYCLOBENZAPRINE HCL 5 MG PO TABS: 5.0000 mg | ORAL_TABLET | Freq: Every day | ORAL | 1 refills | Status: DC

## 2023-05-30 MED ORDER — METHYLPREDNISOLONE 4 MG PO TBPK: ORAL_TABLET | ORAL | 0 refills | Status: DC

## 2023-05-30 NOTE — Progress Notes (Unsigned)
Ok to prescribe another prednisone dosepak. Patient can use .flexeril 5-10mg  twice daily as needed for muscle spasms. No driving after using muscle relaxer. Ok to give flexeril 30 tablet refill if needed.

## 2023-06-07 DIAGNOSIS — M47812 Spondylosis without myelopathy or radiculopathy, cervical region: Secondary | ICD-10-CM | POA: Diagnosis not present

## 2023-06-09 NOTE — Progress Notes (Signed)
 [facility] [address] [place] [address] Phone: [phone] Subjective:   Gabriela Shepard, am serving as a scribe for Dr. Arthea Claudene.  I'm seeing this patient by the request  of:  Gabriela Shepard ORN, MD  CC: back and neck pain   [name]  Gabriela Shepard is a 63 y.o. female coming in with complaint of back and neck pain. OMT on 04/06/2023. Patient states that she had ablation but this did not help. Saw Dr. Cyrena and she was told that it will take time. Is on 3rd round of steroids. Pain in R side of neck going down into R scapula. Wants to know if there is another muscle relaxer she could try as none seem to be working. Would like trigger point injections.   Notes fatigue all the time and is having trouble with her memory. Is not eating well. Wants to know if she has fibromyalgia.   Medications patient has been prescribed: Flexeril        Reviewed prior external information including notes and imaging from previsou exam, outside providers and external EMR if available.   As well as notes that were available from care everywhere and other healthcare systems.  Past medical history, social, surgical and family history all reviewed in electronic medical record.  No pertanent information unless stated regarding to the chief complaint.   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   History of UTI    Interstitial cystitis    Low sodium levels    Migraines    Osteoporosis    PONV (postoperative nausea and vomiting)    PTSD (post-traumatic stress disorder)    on meds   STD (sexually transmitted disease)    Substance  abuse (HCC)    hx of alcohol   Urine incontinence     Allergies  Allergen Reactions   Sulfa Antibiotics Hives   Alendronate  Other (See Comments)     Review of Systems:  No headache, visual changes, nausea, vomiting, diarrhea, constipation, dizziness, abdominal pain, skin rash, fevers, chills, night sweats, weight loss, swollen lymph nodes, body aches, joint swelling, chest pain, shortness of breath, mood changes. POSITIVE muscle aches  Objective  Blood pressure 100/62, pulse 74, height 5' 4 (1.626 m), weight 160 lb (72.6 kg), last menstrual period 06/07/2010, SpO2 98%.   General: No apparent distress alert and oriented x3 mood and affect normal, dressed appropriately.  HEENT: Pupils equal, extraocular movements intact  Respiratory: Patient's speak in full sentences and does not appear short of breath  Cardiovascular: No lower extremity edema, non tender, no erythema  Patient does have significant tightness noted in the right shoulder region injury left her scapula, rhomboid, trapezius.  Patient does have pain even going up the SCM.  After verbal consent patient was prepped with alcohol swab and with a 25-gauge half inch needle injected with a total of 6 cc of 0.5% Marcaine  and 2 cc of Kenalog  40 mg/mL in 5 distinct trigger points.  Minimal blood loss.  Band-Aids placed.  Postinjection instructions given.  Assessment and Plan:  Trigger point of right shoulder region Patient given injections again today.  Has responded to these in the past.  Discussed icing regimen and home exercises, discussed which activities to do and which ones to avoid.  Increase activity slowly.  Follow-up with me again in 6 to 8 weeks.  Patient given a new muscle relaxer of Robaxin .  Warned of potential side effects and to discontinue all other muscle relaxers       The above documentation has been reviewed and is accurate and complete Rashauna Tep M Anuhea Gassner, DO         Note: This dictation was prepared with  Dragon dictation along with smaller phrase technology. Any transcriptional errors that result from this process are unintentional.

## 2023-06-14 ENCOUNTER — Encounter: Payer: Self-pay | Admitting: Family Medicine

## 2023-06-14 ENCOUNTER — Ambulatory Visit: Payer: BC Managed Care – PPO | Admitting: Family Medicine

## 2023-06-14 VITALS — BP 100/62 | HR 74 | Ht 64.0 in | Wt 160.0 lb

## 2023-06-14 DIAGNOSIS — E538 Deficiency of other specified B group vitamins: Secondary | ICD-10-CM | POA: Diagnosis not present

## 2023-06-14 DIAGNOSIS — M25511 Pain in right shoulder: Secondary | ICD-10-CM | POA: Diagnosis not present

## 2023-06-14 DIAGNOSIS — M2550 Pain in unspecified joint: Secondary | ICD-10-CM | POA: Diagnosis not present

## 2023-06-14 LAB — COMPREHENSIVE METABOLIC PANEL
ALT: 15 U/L (ref 0–35)
AST: 15 U/L (ref 0–37)
Albumin: 4.1 g/dL (ref 3.5–5.2)
Alkaline Phosphatase: 52 U/L (ref 39–117)
BUN: 14 mg/dL (ref 6–23)
CO2: 29 meq/L (ref 19–32)
Calcium: 8.7 mg/dL (ref 8.4–10.5)
Chloride: 94 meq/L — ABNORMAL LOW (ref 96–112)
Creatinine, Ser: 0.73 mg/dL (ref 0.40–1.20)
GFR: 88.09 mL/min (ref >60.00)
Glucose, Bld: 119 mg/dL — ABNORMAL HIGH (ref 70–99)
Potassium: 4.2 meq/L (ref 3.5–5.1)
Sodium: 129 meq/L — ABNORMAL LOW (ref 135–145)
Total Bilirubin: 0.2 mg/dL (ref 0.2–1.2)
Total Protein: 6.8 g/dL (ref 6.0–8.3)

## 2023-06-14 LAB — IBC PANEL
Iron: 38 ug/dL — ABNORMAL LOW (ref 42–145)
Saturation Ratios: 10.9 % — ABNORMAL LOW (ref 20.0–50.0)
TIBC: 348.6 ug/dL (ref 250.0–450.0)
Transferrin: 249 mg/dL (ref 212.0–360.0)

## 2023-06-14 LAB — CBC WITH DIFFERENTIAL/PLATELET
Basophils Absolute: 0 10*3/uL (ref 0.0–0.1)
Basophils Relative: 0.6 % (ref 0.0–3.0)
Eosinophils Absolute: 0.1 10*3/uL (ref 0.0–0.7)
Eosinophils Relative: 1.9 % (ref 0.0–5.0)
HCT: 39.7 % (ref 36.0–46.0)
Hemoglobin: 13.1 g/dL (ref 12.0–15.0)
Lymphocytes Relative: 24 % (ref 12.0–46.0)
Lymphs Abs: 1.6 10*3/uL (ref 0.7–4.0)
MCHC: 33.1 g/dL (ref 30.0–36.0)
MCV: 91.8 fL (ref 78.0–100.0)
Monocytes Absolute: 0.6 10*3/uL (ref 0.1–1.0)
Monocytes Relative: 8.2 % (ref 3.0–12.0)
Neutro Abs: 4.5 10*3/uL (ref 1.4–7.7)
Neutrophils Relative %: 65.3 % (ref 43.0–77.0)
Platelets: 362 10*3/uL (ref 150.0–400.0)
RBC: 4.33 Mil/uL (ref 3.87–5.11)
RDW: 12.8 % (ref 11.5–15.5)
WBC: 6.9 10*3/uL (ref 4.0–10.5)

## 2023-06-14 LAB — VITAMIN B12: Vitamin B-12: 868 pg/mL (ref 211–911)

## 2023-06-14 LAB — TSH: TSH: 0.95 u[IU]/mL (ref 0.35–5.50)

## 2023-06-14 MED ORDER — CYANOCOBALAMIN 1000 MCG/ML IJ SOLN
1000.0000 ug | Freq: Once | INTRAMUSCULAR | Status: AC
  Administered 2023-06-14: 1000 ug via INTRAMUSCULAR

## 2023-06-14 MED ORDER — METHOCARBAMOL 500 MG PO TABS: 500.0000 mg | ORAL_TABLET | Freq: Three times a day (TID) | ORAL | 0 refills | Status: DC | PRN

## 2023-06-14 NOTE — Patient Instructions (Signed)
 Labs today B12 after labs See me again in 8 weeks

## 2023-06-14 NOTE — Assessment & Plan Note (Signed)
 Labs given today

## 2023-06-14 NOTE — Assessment & Plan Note (Signed)
 Patient given injections again today.  Has responded to these in the past.  Discussed icing regimen and home exercises, discussed which activities to do and which ones to avoid.  Increase activity slowly.  Follow-up with me again in 6 to 8 weeks.  Patient given a new muscle relaxer of Robaxin .  Warned of potential side effects and to discontinue all other muscle relaxers

## 2023-06-14 NOTE — Assessment & Plan Note (Addendum)
 B12 deficiency noted.  Will monitor.  Injection given today.

## 2023-06-18 ENCOUNTER — Encounter: Payer: Self-pay | Admitting: Family Medicine

## 2023-06-22 ENCOUNTER — Encounter: Payer: Self-pay | Admitting: Family Medicine

## 2023-06-22 ENCOUNTER — Other Ambulatory Visit: Payer: Self-pay

## 2023-06-22 ENCOUNTER — Telehealth: Payer: Self-pay | Admitting: Family Medicine

## 2023-06-22 DIAGNOSIS — E611 Iron deficiency: Secondary | ICD-10-CM

## 2023-06-22 NOTE — Telephone Encounter (Signed)
 Pt was called and she said she can see dr Tova Fresh sooner then  GI appt on  08-04-23 . Pt has [employer] . Pt need an appt with dr Tova Fresh asap

## 2023-06-22 NOTE — Telephone Encounter (Signed)
 Copied from CRM [id number]. Topic: Referral - Question >> Jun 22, 2023  3:37 PM Jayson Michael wrote: Reason for CRM:  Pt  is requesting referral to Dr. Tova Fresh- the one she was sent to is booked for 2 months. And this one is  accepting new pts and can see her sooner. Pt stated she  is having a lot of stomach problems , her iron is very low and she cannot take the iron pills.  Pt is requesting a call back at [phone]

## 2023-06-28 ENCOUNTER — Encounter: Payer: Self-pay | Admitting: Family Medicine

## 2023-06-29 ENCOUNTER — Encounter: Payer: Self-pay | Admitting: Family Medicine

## 2023-06-30 NOTE — Telephone Encounter (Signed)
Noted  

## 2023-06-30 NOTE — Telephone Encounter (Signed)
Referral was sent to Dr.Mann.

## 2023-07-01 DIAGNOSIS — F439 Reaction to severe stress, unspecified: Secondary | ICD-10-CM | POA: Diagnosis not present

## 2023-07-03 ENCOUNTER — Other Ambulatory Visit: Payer: Self-pay | Admitting: Family Medicine

## 2023-07-04 NOTE — Telephone Encounter (Signed)
Rx done.

## 2023-07-06 ENCOUNTER — Encounter: Payer: Self-pay | Admitting: Family Medicine

## 2023-07-06 ENCOUNTER — Other Ambulatory Visit: Payer: Self-pay | Admitting: Family Medicine

## 2023-07-06 ENCOUNTER — Ambulatory Visit: Payer: BC Managed Care – PPO | Admitting: Family Medicine

## 2023-07-06 VITALS — BP 134/70 | HR 73 | Temp 97.3°F | Wt 160.1 lb

## 2023-07-06 DIAGNOSIS — R5383 Other fatigue: Secondary | ICD-10-CM

## 2023-07-06 DIAGNOSIS — E611 Iron deficiency: Secondary | ICD-10-CM

## 2023-07-06 DIAGNOSIS — R1084 Generalized abdominal pain: Secondary | ICD-10-CM | POA: Diagnosis not present

## 2023-07-06 DIAGNOSIS — R197 Diarrhea, unspecified: Secondary | ICD-10-CM

## 2023-07-06 DIAGNOSIS — R300 Dysuria: Secondary | ICD-10-CM

## 2023-07-06 DIAGNOSIS — R400 Somnolence: Secondary | ICD-10-CM

## 2023-07-06 MED ORDER — OMEPRAZOLE 40 MG PO CPDR: 40.0000 mg | DELAYED_RELEASE_CAPSULE | Freq: Two times a day (BID) | ORAL | 1 refills | Status: DC

## 2023-07-06 NOTE — Progress Notes (Unsigned)
Established Patient Office Visit  Subjective   Patient ID: Gabriela Shepard, female    DOB: Sep 16, 1960  Age: 63 y.o. MRN: [id number]  Chief Complaint  Patient presents with   Abdominal Pain   Dysuria   Fatigue    HPI  {History (Optional):23778} [name] has history of migraine headaches, IBS, osteoporosis, interstitial cystitis, fibromyalgia, recurrent depression, chronic hyponatremia.  She is seen today to discuss multiple items.  Recently saw sports medicine.  Had low serum iron which is slightly low but no ferritin was done.  Hemoglobin 13.1.  Her major complaint is profound fatigue.  She has a lot of daytime somnolence.  No known history of sleep apnea.  She continues to have abdominal pain which is somewhat migratory.  Initially epigastric but now more diffuse.  Has been taking proton pump inhibitor with omeprazole 40 mg daily and requesting refills.  Denies any stool changes.  She denies any weight loss and in fact has gained some weight since she was here last November.  She has been referred to GI but does not have an appointment till February.  Does have some intermittent diarrhea but no bloody stools.  No clear triggers for diarrhea.  Other complaint today is some mild dysuria.  No real vaginal discharge but she did notice some change in vaginal odor.  Has not been sexually active in several years.  Denies any vaginal bleeding.  No fevers or chills.  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   History of UTI    Interstitial cystitis    Low sodium levels    Migraines    Osteoporosis    PONV (postoperative nausea and vomiting)    PTSD (post-traumatic stress  disorder)    on meds   STD (sexually transmitted disease)    Substance abuse (HCC)    hx of alcohol   Urine incontinence    Past Surgical History:  Procedure Laterality Date   BREAST BIOPSY Left    2017 benign x 2   CESAREAN SECTION  1992   COLONOSCOPY  2018   JMP-MAC-suprep(good)-polyps   INCONTINENCE SURGERY     LAPAROSCOPY     under bladder   NECK SURGERY  02/24/2018   ACDF   POLYPECTOMY  2018   polyps removed   TOTAL HIP ARTHROPLASTY Right 09/08/2020   TOTAL HIP ARTHROPLASTY Left 2018   TOTAL KNEE ARTHROPLASTY Left 12/23/2015   Procedure: TOTAL KNEE ARTHROPLASTY;  Surgeon: Marcene Corning, MD;  Location: [facility] OR;  Service: Orthopedics;  Laterality: Left;    reports that she quit smoking about 12 years ago. Her smoking use included cigarettes. She started smoking about 22 years ago. She has a 5 pack-year smoking history. She has never used smokeless tobacco. She reports that she does not drink alcohol and does not use drugs. family history includes Alcohol abuse in her brother and another family member; Alcoholism in her brother; Arthritis in an other family member; Cancer (age of onset: 30) in her father; Drug abuse in her sister; Healthy in her daughter and son; Heart  disease in her father; Hypertension in her maternal grandmother, mother, and another family member; Kidney cancer in her father; Lung cancer in her mother; Mental illness in an other family member; Ulcers in her father. Allergies  Allergen Reactions   Sulfa Antibiotics Hives   Alendronate Other (See Comments)    Review of Systems  Constitutional:  Positive for malaise/fatigue. Negative for chills and fever.  Cardiovascular:  Negative for chest pain.  Gastrointestinal:  Positive for abdominal pain and diarrhea. Negative for blood in stool, melena, nausea and vomiting.  Genitourinary:  Positive for dysuria.      Objective:     BP 134/70 (BP Location: Left Arm, Patient Position: Sitting, Cuff Size: Normal)    Pulse 73   Temp (!) 97.3 F (36.3 C) (Oral)   Wt 160 lb 1.6 oz (72.6 kg)   LMP 06/07/2010 (Approximate)   SpO2 97%   BMI 27.48 kg/m  BP Readings from Last 3 Encounters:  07/06/23 134/70  06/14/23 100/62  05/24/23 122/82   Wt Readings from Last 3 Encounters:  07/06/23 160 lb 1.6 oz (72.6 kg)  06/14/23 160 lb (72.6 kg)  05/24/23 155 lb (70.3 kg)      Physical Exam Vitals reviewed.  Constitutional:      General: She is not in acute distress.    Appearance: She is not ill-appearing.  Cardiovascular:     Rate and Rhythm: Normal rate and regular rhythm.  Pulmonary:     Effort: Pulmonary effort is normal.     Breath sounds: Normal breath sounds. No wheezing or rales.  Abdominal:     Comments: Nondistended.  Normal bowel sounds.  Soft and nontender.  No masses palpated.  Neurological:     Mental Status: She is alert.      No results found for any visits on 07/06/23.  Last CBC Lab Results  Component Value Date   WBC 6.9 06/14/2023   HGB 13.1 06/14/2023   HCT 39.7 06/14/2023   MCV 91.8 06/14/2023   MCH 29.9 06/02/2021   RDW 12.8 06/14/2023   PLT 362.0 06/14/2023   Last metabolic panel Lab Results  Component Value Date   GLUCOSE 119 (H) 06/14/2023   NA 129 (L) 06/14/2023   K 4.2 06/14/2023   CL 94 (L) 06/14/2023   CO2 29 06/14/2023   BUN 14 06/14/2023   CREATININE 0.73 06/14/2023   GFR 88.09 06/14/2023   CALCIUM 8.7 06/14/2023   PROT 6.8 06/14/2023   ALBUMIN 4.1 06/14/2023   BILITOT 0.2 06/14/2023   ALKPHOS 52 06/14/2023   AST 15 06/14/2023   ALT 15 06/14/2023   ANIONGAP 8 06/02/2021   Last thyroid functions Lab Results  Component Value Date   TSH 0.95 06/14/2023   Last vitamin B12 and Folate Lab Results  Component Value Date   VITAMINB12 868 06/14/2023      The 10-year ASCVD risk score (Arnett DK, et al., 2019) is: 3.8%    Assessment & Plan:   Problem List Items Addressed This Visit   None Visit Diagnoses       Dysuria    -  Primary    Relevant Orders   POCT Urinalysis Dipstick (Automated)   Urine cytology ancillary only   Urinalysis with Culture Reflex     Diarrhea, unspecified type       Relevant Orders   Helicobacter Pylori Special Antigen, Stool     Generalized abdominal pain       Relevant Orders   Urinalysis with Culture  Reflex   Sedimentation rate   Tissue transglutaminase, IgA     Iron deficiency       Relevant Orders   Ferritin     63 year old female who presents with multiple symptoms and issues as above.  Her major complaint is profound fatigue and frequent daytime somnolence.  No observed apnea but she lives alone.  She does have mild iron deficiency by recent labs but normal hemoglobin.  GI appointment pending.  -Check ferritin level in view of recent mildly low serum iron -Check tissue transglutaminase with her frequent abdominal symptoms and frequent loose stools--in addition to low serum iron.  Will also check H. pylori. -Regarding her dysuria check urinalysis with reflex to culture if indicated and also wet prep.  We did not obtain any STD screening since she is not sexually active. -Consider further workup for rule out obstructive sleep apnea  No follow-ups on file.    [name], MD

## 2023-07-07 ENCOUNTER — Telehealth: Payer: Self-pay

## 2023-07-07 DIAGNOSIS — R197 Diarrhea, unspecified: Secondary | ICD-10-CM | POA: Diagnosis not present

## 2023-07-07 LAB — URINALYSIS W MICROSCOPIC + REFLEX CULTURE
Bilirubin Urine: NEGATIVE
Glucose, UA: NEGATIVE
Hgb urine dipstick: NEGATIVE
Hyaline Cast: NONE SEEN /[LPF]
Ketones, ur: NEGATIVE
Leukocyte Esterase: NEGATIVE
Nitrites, Initial: NEGATIVE
Protein, ur: NEGATIVE
RBC / HPF: NONE SEEN /[HPF] (ref 0–2)
Specific Gravity, Urine: 1.014 (ref 1.001–1.035)
pH: 6.5 (ref 5.0–8.0)

## 2023-07-07 LAB — TISSUE TRANSGLUTAMINASE, IGA: (tTG) Ab, IgA: <1 U/mL

## 2023-07-07 LAB — FERRITIN: Ferritin: 47 ng/mL (ref 10.0–291.0)

## 2023-07-07 LAB — SEDIMENTATION RATE: Sed Rate: 28 mm/h (ref 0–30)

## 2023-07-07 LAB — NO CULTURE INDICATED

## 2023-07-07 NOTE — Telephone Encounter (Signed)
Copied from CRM #[id number]. Topic: Clinical - Request for Lab/Test Order >> Jul 07, 2023 10:41 AM Corin V wrote: Reason for CRM: [facility] lab called and stated they cannot use the urinalysis order for the patient. Urine culture can only test for chlaymidia, ghhonorea, and trych. They did not specify what to reorder the test as since they were unsure what Dr. Caryl Never needs tested. Please enter new labs and call the lab once they are in the system since patient is there. Please call lab with any further questions at [phone]

## 2023-07-08 LAB — HELICOBACTER PYLORI  SPECIAL ANTIGEN
MICRO NUMBER:: 16020150
SPECIMEN QUALITY: ADEQUATE

## 2023-07-08 NOTE — Telephone Encounter (Signed)
I spoke with [facility] with Cytology and she reported that they cannot test for candida and Gardnerella via urine and this can only be tested for with Aptima swab. Please advise if patient should come back in for retesting.

## 2023-07-08 NOTE — Telephone Encounter (Signed)
Patient informed of the message below. Patinet reported she will follow up if symptoms persist

## 2023-07-12 ENCOUNTER — Encounter: Payer: Self-pay | Admitting: Family Medicine

## 2023-07-13 ENCOUNTER — Ambulatory Visit: Payer: BC Managed Care – PPO | Admitting: Family Medicine

## 2023-07-13 ENCOUNTER — Encounter: Payer: Self-pay | Admitting: Family Medicine

## 2023-07-13 VITALS — BP 92/60 | HR 65 | Temp 97.5°F | Wt 156.7 lb

## 2023-07-13 DIAGNOSIS — J069 Acute upper respiratory infection, unspecified: Secondary | ICD-10-CM

## 2023-07-13 MED ORDER — CEFUROXIME AXETIL 500 MG PO TABS: 500.0000 mg | ORAL_TABLET | Freq: Two times a day (BID) | ORAL | 0 refills | Status: DC

## 2023-07-13 MED ORDER — HYDROCODONE BIT-HOMATROP MBR 5-1.5 MG/5ML PO SOLN: 5.0000 mL | Freq: Four times a day (QID) | ORAL | 0 refills | Status: DC | PRN

## 2023-07-13 NOTE — Progress Notes (Signed)
 Established Patient Office Visit  Subjective   Patient ID: Gabriela Shepard, female    DOB: 03-11-1961  Age: 63 y.o. MRN: [id number]  Chief Complaint  Patient presents with   Cough   Sinusitis   Generalized Body Aches   Sore Throat    HPI   Gabriela Shepard is seen with upper respiratory symptoms that started last week.  Onset about 6 days ago.  She developed some sore throat, nasal congestion, cough, body aches and increased malaise.  She is taken some NyQuil but still had significant coughing.  Feels actually worse in terms of chest congestion now than she did last week.  Cough productive of brown sputum at times.  She is also developing some increasing facial pain and intermittent headaches.  No documented fever.  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   History of UTI    Interstitial cystitis    Low sodium levels    Migraines    Osteoporosis    PONV (postoperative nausea and vomiting)    PTSD (post-traumatic stress disorder)    on meds   STD (sexually transmitted disease)    Substance abuse (HCC)    hx of alcohol   Urine incontinence    Past Surgical History:  Procedure Laterality Date   BREAST BIOPSY Left    2017 benign x 2   CESAREAN SECTION  1992   COLONOSCOPY  2018   JMP-MAC-suprep(good)-polyps   INCONTINENCE SURGERY     LAPAROSCOPY     under bladder   NECK SURGERY  02/24/2018   ACDF   POLYPECTOMY  2018   polyps removed   TOTAL HIP ARTHROPLASTY Right 09/08/2020   TOTAL HIP ARTHROPLASTY Left 2018   TOTAL KNEE ARTHROPLASTY Left 12/23/2015   Procedure: TOTAL KNEE ARTHROPLASTY;  Surgeon: Maude Herald, MD;  Location: [facility] OR;  Service: Orthopedics;   Laterality: Left;    reports that she quit smoking about 12 years ago. Her smoking use included cigarettes. She started smoking about 22 years ago. She has a 5 pack-year smoking history. She has never used smokeless tobacco. She reports that she does not drink alcohol and does not use drugs. family history includes Alcohol abuse in her brother and another family member; Alcoholism in her brother; Arthritis in an other family member; Cancer (age of onset: 85) in her father; Drug abuse in her sister; Healthy in her daughter and son; Heart disease in her father; Hypertension in her maternal grandmother, mother, and another family member; Kidney cancer in her father; Lung cancer in her mother; Mental illness in an other family member; Ulcers in her father. Allergies  Allergen Reactions   Sulfa Antibiotics Hives   Alendronate  Other (See Comments)    Review of Systems  Constitutional:  Positive for malaise/fatigue. Negative for chills and fever.  HENT:  Positive for congestion, sinus pain and sore throat.   Respiratory:  Positive for cough. Negative for hemoptysis.       Objective:     BP 92/60 (BP Location: Left Arm, Patient Position: Sitting, Cuff Size: Normal)  Pulse 65   Temp (!) 97.5 F (36.4 C) (Oral)   Wt 156 lb 11.2 oz (71.1 kg)   LMP 06/07/2010 (Approximate)   SpO2 97%   BMI 26.90 kg/m  BP Readings from Last 3 Encounters:  07/13/23 92/60  07/06/23 134/70  06/14/23 100/62   Wt Readings from Last 3 Encounters:  07/13/23 156 lb 11.2 oz (71.1 kg)  07/06/23 160 lb 1.6 oz (72.6 kg)  06/14/23 160 lb (72.6 kg)      Physical Exam Vitals reviewed.  Constitutional:      General: She is not in acute distress.    Appearance: She is not ill-appearing.  HENT:     Right Ear: Tympanic membrane normal.     Left Ear: Tympanic membrane normal.     Mouth/Throat:     Pharynx: Oropharynx is clear. No oropharyngeal exudate or posterior oropharyngeal erythema.  Cardiovascular:     Rate  and Rhythm: Normal rate and regular rhythm.  Pulmonary:     Breath sounds: Normal breath sounds. No wheezing or rales.  Musculoskeletal:     Cervical back: Neck supple.  Lymphadenopathy:     Cervical: No cervical adenopathy.  Neurological:     Mental Status: She is alert.      No results found for any visits on 07/13/23.    The 10-year ASCVD risk score (Arnett DK, et al., 2019) is: 1.8%    Assessment & Plan:   Upper respiratory infection with cough.  Patient states she feels worse than she did last week.  She is developing progressive sinusitis symptoms.  Cough is severe at night and not relieved with over-the-counter medications.  She has tried Tessalon  pearls in the past without success.    -Start Ceftin  500 mg twice daily for 7 days -Plenty fluids and rest -Limited Hycodan cough syrup 1 teaspoon every 6 hours as needed for cough.  She is aware of potential for sedation with this.  Follow-up promptly for any fever or worsening symptoms.  [name], MD

## 2023-07-14 ENCOUNTER — Telehealth: Payer: Self-pay | Admitting: Pharmacy Technician

## 2023-07-14 NOTE — Telephone Encounter (Signed)
 Dr. Claudene, [facility] note:  patient will be scheduled as soon as possible.  Auth Submission: NO AUTH NEEDED Site of care: Site of care: [facility] Payer: [employer] [place]  Medication & CPT/J Code(s) submitted: Feraheme (ferumoxytol ) [id number] Route of submission (phone, fax, portal):  Phone # [phone] Fax # Auth type: Buy/Bill PB Units/visits requested: 2 DOSES Reference number: [id number] Approval from: 07/14/23 to 12/11/23

## 2023-07-18 ENCOUNTER — Ambulatory Visit (INDEPENDENT_AMBULATORY_CARE_PROVIDER_SITE_OTHER): Payer: BC Managed Care – PPO

## 2023-07-18 ENCOUNTER — Encounter: Payer: Self-pay | Admitting: Family Medicine

## 2023-07-18 VITALS — BP 124/75 | HR 60 | Temp 97.5°F | Resp 18 | Ht 64.0 in | Wt 159.0 lb

## 2023-07-18 DIAGNOSIS — D509 Iron deficiency anemia, unspecified: Secondary | ICD-10-CM | POA: Diagnosis not present

## 2023-07-18 MED ORDER — ACETAMINOPHEN 325 MG PO TABS
650.0000 mg | ORAL_TABLET | Freq: Once | ORAL | Status: AC
  Administered 2023-07-18: 650 mg via ORAL
  Filled 2023-07-18: qty 2

## 2023-07-18 MED ORDER — DIPHENHYDRAMINE HCL 25 MG PO CAPS
25.0000 mg | ORAL_CAPSULE | Freq: Once | ORAL | Status: AC
  Administered 2023-07-18: 25 mg via ORAL
  Filled 2023-07-18: qty 1

## 2023-07-18 MED ORDER — SODIUM CHLORIDE 0.9 % IV SOLN
510.0000 mg | Freq: Once | INTRAVENOUS | Status: AC
  Administered 2023-07-18: 510 mg via INTRAVENOUS
  Filled 2023-07-18: qty 17

## 2023-07-18 NOTE — Patient Instructions (Signed)

## 2023-07-18 NOTE — Progress Notes (Signed)
Diagnosis: Iron Deficiency Anemia  Provider:  Chilton Greathouse MD  Procedure: IV Infusion  IV Type: Peripheral, IV Location: R Antecubital  Feraheme (Ferumoxytol), Dose: 510 mg  Infusion Start Time: 1340  Infusion Stop Time: 1359  Post Infusion IV Care: Observation period completed and Peripheral IV Discontinued  Discharge: Condition: Good, Destination: Home . AVS Provided  Performed by:  Adriana Mccallum, RN

## 2023-07-25 ENCOUNTER — Ambulatory Visit: Payer: BC Managed Care – PPO

## 2023-07-25 VITALS — BP 120/78 | HR 65 | Temp 96.9°F | Resp 16 | Ht 64.0 in | Wt 159.8 lb

## 2023-07-25 DIAGNOSIS — D509 Iron deficiency anemia, unspecified: Secondary | ICD-10-CM | POA: Diagnosis not present

## 2023-07-25 MED ORDER — FERUMOXYTOL INJECTION 510 MG/17 ML
510.0000 mg | Freq: Once | INTRAVENOUS | Status: AC
  Administered 2023-07-25: 510 mg via INTRAVENOUS
  Filled 2023-07-25: qty 17

## 2023-07-25 MED ORDER — DIPHENHYDRAMINE HCL 25 MG PO CAPS
25.0000 mg | ORAL_CAPSULE | Freq: Once | ORAL | Status: AC
  Administered 2023-07-25: 25 mg via ORAL
  Filled 2023-07-25: qty 1

## 2023-07-25 MED ORDER — ACETAMINOPHEN 325 MG PO TABS
650.0000 mg | ORAL_TABLET | Freq: Once | ORAL | Status: AC
  Administered 2023-07-25: 650 mg via ORAL
  Filled 2023-07-25: qty 2

## 2023-07-25 NOTE — Progress Notes (Signed)
Diagnosis: Acute Anemia  Provider:  Chilton Greathouse MD  Procedure: IV Infusion  IV Type: Peripheral, IV Location: L Antecubital  Feraheme (Ferumoxytol), Dose: 510 mg  Infusion Start Time: 1350  Infusion Stop Time: 1410  Post Infusion IV Care: Observation period completed and Peripheral IV Discontinued  Discharge: Condition: Good, Destination: Home . AVS Declined  Performed by:  Nat Math, RN

## 2023-07-28 ENCOUNTER — Encounter: Payer: Self-pay | Admitting: Family Medicine

## 2023-07-29 DIAGNOSIS — F439 Reaction to severe stress, unspecified: Secondary | ICD-10-CM | POA: Diagnosis not present

## 2023-07-29 MED ORDER — CEFUROXIME AXETIL 500 MG PO TABS
500.0000 mg | ORAL_TABLET | Freq: Two times a day (BID) | ORAL | 0 refills | Status: AC
Start: 2023-07-29 — End: 2023-08-05

## 2023-08-04 ENCOUNTER — Ambulatory Visit: Payer: BC Managed Care – PPO | Admitting: Physician Assistant

## 2023-08-04 ENCOUNTER — Ambulatory Visit: Payer: BC Managed Care – PPO | Admitting: Gastroenterology

## 2023-08-04 ENCOUNTER — Encounter: Payer: Self-pay | Admitting: Gastroenterology

## 2023-08-04 VITALS — BP 118/60 | HR 82 | Ht 64.0 in | Wt 156.4 lb

## 2023-08-04 DIAGNOSIS — R12 Heartburn: Secondary | ICD-10-CM | POA: Insufficient documentation

## 2023-08-04 DIAGNOSIS — R1013 Epigastric pain: Secondary | ICD-10-CM | POA: Diagnosis not present

## 2023-08-04 DIAGNOSIS — Z791 Long term (current) use of non-steroidal anti-inflammatories (NSAID): Secondary | ICD-10-CM | POA: Insufficient documentation

## 2023-08-04 MED ORDER — OMEPRAZOLE 40 MG PO CPDR: 40.0000 mg | DELAYED_RELEASE_CAPSULE | Freq: Two times a day (BID) | ORAL | 3 refills | Status: AC

## 2023-08-04 NOTE — Patient Instructions (Signed)
 We have sent the following medications to your pharmacy for you to pick up at your convenience: Omeprazole 40 mg daily 30-60 minutes before breakfast and dinner.   You have been scheduled for an endoscopy. Please follow written instructions given to you at your visit today.  If you use inhalers (even only as needed), please bring them with you on the day of your procedure.  If you take any of the following medications, they will need to be adjusted prior to your procedure:   DO NOT TAKE 7 DAYS PRIOR TO TEST- Trulicity (dulaglutide) Ozempic, Wegovy (semaglutide) Mounjaro (tirzepatide) Bydureon Bcise (exanatide extended release)  DO NOT TAKE 1 DAY PRIOR TO YOUR TEST Rybelsus (semaglutide) Adlyxin (lixisenatide) Victoza (liraglutide) Byetta (exanatide) _________________________________________________________________  _______________________________________________________  If your blood pressure at your visit was 140/90 or greater, please contact your primary care physician to follow up on this.  _______________________________________________________  If you are age 71 or older, your body mass index should be between 23-30. Your Body mass index is 26.85 kg/m. If this is out of the aforementioned range listed, please consider follow up with your Primary Care Provider.  If you are age 48 or younger, your body mass index should be between 19-25. Your Body mass index is 26.85 kg/m. If this is out of the aformentioned range listed, please consider follow up with your Primary Care Provider.   ________________________________________________________  The [facility] providers would like to encourage you to use [facility] to communicate with providers for non-urgent requests or questions.  Due to long hold times on the telephone, sending your provider a message by [facility] may be a faster and more efficient way to get a response.  Please allow 48 business hours for a response.  Please remember  that this is for non-urgent requests.  _______________________________________________________

## 2023-08-04 NOTE — Progress Notes (Signed)
 08/04/2023 Gabriela Shepard [id number] [birth year]   HISTORY OF PRESENT ILLNESS: This is a 63 year old female who is a patient of Dr. Lauro Franklin.  She is here today with complaints of epigastric abdominal pain.  York Spaniel that all this started back in September.  She has been having severe pain in her epigastrium.  Says it is a burning type pain.  She was taking anti-inflammatory medications in the form of meloxicam daily and ibuprofen as needed and was using those things for quite some time.  She also reports a lot of heartburn and bloating and indigestion.  She had omeprazole at home and her PCP told her to start taking it so she started it once a day.  Then after a while she was still complaining of symptoms and PCP increased it to twice a day.  She also stopped the NSAIDs.  She noticed that symptoms improved.  She took twice a day for a little while and then back down to once a day and continued to feel okay, but not completely resolved.  Now she has stopped it about 7-9 days ago, but she feels like symptoms are creeping back up again.  She says that back at that time when these symptoms initially started she started having black tarry stool as well.  She says her hemoglobin was normal but her iron studies were low and she was given IV iron as she cannot tolerate the oral iron.  H. pylori testing was negative.  Colonoscopy UTD, last 11/2020.   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   History of UTI    Interstitial cystitis    Low sodium levels    Migraines    Osteoporosis    PONV (postoperative nausea and vomiting)    PTSD (post-traumatic stress disorder)    on meds    STD (sexually transmitted disease)    Substance abuse (HCC)    hx of alcohol   Urine incontinence    Past Surgical History:  Procedure Laterality Date   BREAST BIOPSY Left    2017 benign x 2   CESAREAN SECTION  1992   COLONOSCOPY  2018   JMP-MAC-suprep(good)-polyps   INCONTINENCE SURGERY     LAPAROSCOPY     under bladder   NECK SURGERY  02/24/2018   ACDF   POLYPECTOMY  2018   polyps removed   TOTAL HIP ARTHROPLASTY Right 09/08/2020   TOTAL HIP ARTHROPLASTY Left 2018   TOTAL KNEE ARTHROPLASTY Left 12/23/2015   Procedure: TOTAL KNEE ARTHROPLASTY;  Surgeon: Marcene Corning, MD;  Location: [facility] OR;  Service: Orthopedics;  Laterality: Left;    reports that she quit smoking about 12 years ago. Her smoking use included cigarettes. She started smoking about 22 years ago. She has a 5 pack-year smoking history. She has never used smokeless tobacco. She reports that she does not drink alcohol and does not use drugs. family history includes Alcohol abuse in her brother and another family member; Alcoholism in her brother; Arthritis in an other family member; Cancer (age of onset: 47) in  her father; Drug abuse in her sister; Healthy in her daughter and son; Heart disease in her father; Hypertension in her maternal grandmother, mother, and another family member; Kidney cancer in her father; Lung cancer in her mother; Mental illness in an other family member; Ulcers in her father. Allergies  Allergen Reactions   Sulfa Antibiotics Hives   Alendronate Other (See Comments)      Outpatient Encounter Medications as of 08/04/2023  Medication Sig   cefUROXime (CEFTIN) 500 MG tablet Take 1 tablet (500 mg total) by mouth 2 (two) times daily with a meal for 7 days.   cetirizine (ZYRTEC) 10 MG tablet Take 1 tablet (10 mg total) by mouth daily.   Cholecalciferol (VITAMIN D3) 1.25 MG (50000 UT) CAPS Take 1 capsule (50,000 Units total) by mouth once a week. For bone health   clobetasol cream (TEMOVATE) 0.05 %  Apply 1 Application topically 2 (two) times daily.   Clobetasol Propionate 0.05 % shampoo Apply 1 Application topically daily as needed.   clonazePAM (KLONOPIN) 1 MG tablet TAKE 1 TABLET BY MOUTH TWICE A DAY AS NEEDED FOR ANXIETY   cyclobenzaprine (FLEXERIL) 5 MG tablet Take 1 tablet (5 mg total) by mouth at bedtime.   cyclobenzaprine (FLEXERIL) 5 MG tablet Take 1 tablet (5 mg total) by mouth at bedtime.   diclofenac (VOLTAREN) 75 MG EC tablet TAKE ONE TABLET BY MOUTH WITH FOOD EVERY 12 HOURS AS NEEDED FOR ARTHRITIC PAIN   dicyclomine (BENTYL) 10 MG capsule Take 1 capsule (10 mg total) by mouth 3 (three) times daily before meals.   docusate sodium (COLACE) 100 MG capsule Take 1 capsule (100 mg total) by mouth daily. For constipation   escitalopram (LEXAPRO) 20 MG tablet TAKE 1 TABLET BY MOUTH EVERY DAY   Estradiol 10 MCG TABS vaginal tablet Place 1 tablet (10 mcg total) vaginally 2 (two) times a week. Place 1 tab nightly for two weeks then twice a week after   fluticasone (FLONASE) 50 MCG/ACT nasal spray Place 2 sprays into both nostrils daily.   gabapentin (NEURONTIN) 300 MG capsule Take one capsule by mouth at bedtime for 2 days and then increase to one po bid   HYDROcodone bit-homatropine (HYCODAN) 5-1.5 MG/5ML syrup Take 5 mLs by mouth every 6 (six) hours as needed.   hydrOXYzine (ATARAX) 50 MG tablet TAKE 1 TABLET BY MOUTH EVERYDAY AT BEDTIME   lidocaine-prilocaine (EMLA) cream Apply 1 Application topically as needed.   magic mouthwash SOLN use 1-2 tsps swish, gargle, and spit every 6 hours as needed   melatonin 5 MG TABS Take 1 tablet (5 mg total) by mouth at bedtime. For sleep (Patient taking differently: Take 10 mg by mouth at bedtime. For sleep)   methocarbamol (ROBAXIN) 500 MG tablet Take 1 tablet (500 mg total) by mouth every 8 (eight) hours as needed for muscle spasms.   montelukast (SINGULAIR) 10 MG tablet Take 1 tablet (10 mg total) by mouth at bedtime.   NONFORMULARY OR COMPOUNDED  ITEM Amitriptyline 2.5%/ gabapentin 2.5%/ baclofen 2.5% in vaginal cream. Place 0.5g twice a day in vaginal/ vulvar area. Dispense 60g with 5 refills.   polyethylene glycol (MIRALAX / GLYCOLAX) 17 g packet Take 17 g by mouth daily. For constipation   SUMAtriptan (IMITREX) 100 MG tablet TAKE 1 TABLET AS NEEDED FOR MIGRAINES MAY REPEAT ONCE IN 2 HOURS AS NEEDED MAX 2 IN 24 HOURS.   traMADol (ULTRAM) 50 MG tablet TAKE 1 TABLET BY MOUTH EVERY 6 HOURS AS NEEDED FOR  UP TO 5 DAYS.   traZODone (DESYREL) 100 MG tablet TAKE 1 TABLET BY MOUTH EVERY DAY AT BEDTIME FOR SLEEP   valACYclovir (VALTREX) 500 MG tablet TAKE 1 TABLET BY MOUTH TWICE A DAY FOR 3 DAYS AS NEEDED   omeprazole (PRILOSEC) 40 MG capsule Take 1 capsule (40 mg total) by mouth 2 (two) times daily. Take in the morning and at dinnertime.   promethazine (PHENERGAN) 25 MG tablet TAKE 1 TABLET BY MOUTH EVERY 8 HOURS AS NEEDED FOR NAUSEA OR VOMITING. (Patient not taking: Reported on 08/04/2023)   [DISCONTINUED] AIMOVIG 70 MG/ML SOAJ INJECT 70 MG INTO THE SKIN EVERY 30 (THIRTY) DAYS.   [DISCONTINUED] cyclobenzaprine (FLEXERIL) 5 MG tablet Take 1 tablet (5 mg total) by mouth at bedtime.   [DISCONTINUED] methylPREDNISolone (MEDROL DOSEPAK) 4 MG TBPK tablet Take 6 tablets on day 1.  Take 5 tablets on day 2.  Take 4 tablets on day 3.  Take 3 tablets on day 4.  Take 2 tablets on day 5.  Take 1 tablet on day 6.   [DISCONTINUED] methylPREDNISolone (MEDROL DOSEPAK) 4 MG TBPK tablet Take 6 tablets on day 1.  Take 5 tablets on day 2.  Take 4 tablets on day 3.  Take 3 tablets on day 4.  Take 2 tablets on day 5.  Take 1 tablet on day 6.   [DISCONTINUED] omeprazole (PRILOSEC) 40 MG capsule Take 1 capsule (40 mg total) by mouth 2 (two) times daily. Take in the morning and at dinnertime. (Patient not taking: Reported on 08/04/2023)   [DISCONTINUED] predniSONE (STERAPRED UNI-PAK 48 TAB) 5 MG (48) TBPK tablet 12 day dosepack po   No facility-administered encounter  medications on file as of 08/04/2023.    REVIEW OF SYSTEMS  : All other systems reviewed and negative except where noted in the History of Present Illness.   PHYSICAL EXAM: BP 118/60   Pulse 82   Ht 5\' 4"  (1.626 m)   Wt 156 lb 6.4 oz (70.9 kg)   LMP 06/07/2010 (Approximate)   BMI 26.85 kg/m  General: Well developed white female in no acute distress Head: Normocephalic and atraumatic Eyes:  Sclerae anicteric, conjunctiva pink. Ears: Normal auditory acuity Lungs: Clear throughout to auscultation; no W/R/R. Heart: Regular rate and rhythm; no M/R/G. Abdomen: Soft, non-distended.  BS present.  Epigastric and left epigastric TTP. Musculoskeletal: Symmetrical with no gross deformities  Skin: No lesions on visible extremities Extremities: No edema  Neurological: Alert oriented x 4, grossly non-focal Psychological:  Alert and cooperative. Normal mood and affect  ASSESSMENT AND PLAN: *63 year old female with complaints of epigastric abdominal pain and heartburn/indigestion in the setting of longstanding NSAID use.  Symptoms began at least 4 months ago.  She has stopped taking the NSAIDs and was on omeprazole 40 mg twice daily then decreased it to once daily.  Symptoms improved.  Now stopped taking omeprazole, but feels like symptoms are creeping back up again.  I encouraged her to restart that.  Will send a new prescription to her pharmacy.  H. pylori testing was negative.  She should continue off of NSAIDs for now.  Will plan for EGD with Dr. Tomasa Rand next week since Dr. Lauro Franklin first available is not until April.  The risks, benefits, and alternatives to EGD were discussed with the patient and she consents to proceed.   CC:  Kristian Covey, MD

## 2023-08-05 ENCOUNTER — Encounter: Payer: Self-pay | Admitting: Family Medicine

## 2023-08-05 ENCOUNTER — Other Ambulatory Visit: Payer: Self-pay | Admitting: Family Medicine

## 2023-08-05 DIAGNOSIS — F439 Reaction to severe stress, unspecified: Secondary | ICD-10-CM | POA: Diagnosis not present

## 2023-08-05 MED ORDER — HYDROCODONE BIT-HOMATROP MBR 5-1.5 MG/5ML PO SOLN: 5.0000 mL | Freq: Four times a day (QID) | ORAL | 0 refills | Status: DC | PRN

## 2023-08-06 NOTE — Progress Notes (Signed)
 Agree with the assessment and plan as outlined by Doug Sou, PA-C.  Caisley Baxendale E. Tomasa Rand, MD  [facility]

## 2023-08-08 ENCOUNTER — Encounter: Payer: Self-pay | Admitting: Gastroenterology

## 2023-08-08 ENCOUNTER — Ambulatory Visit (AMBULATORY_SURGERY_CENTER): Payer: BC Managed Care – PPO | Admitting: Gastroenterology

## 2023-08-08 VITALS — BP 122/88 | HR 57 | Temp 97.5°F | Resp 15 | Ht 64.0 in | Wt 156.0 lb

## 2023-08-08 DIAGNOSIS — R1013 Epigastric pain: Secondary | ICD-10-CM | POA: Diagnosis not present

## 2023-08-08 DIAGNOSIS — K449 Diaphragmatic hernia without obstruction or gangrene: Secondary | ICD-10-CM

## 2023-08-08 DIAGNOSIS — K319 Disease of stomach and duodenum, unspecified: Secondary | ICD-10-CM | POA: Diagnosis not present

## 2023-08-08 MED ORDER — SODIUM CHLORIDE 0.9 % IV SOLN: 500.0000 mL | Freq: Once | INTRAVENOUS | Status: DC

## 2023-08-08 NOTE — Progress Notes (Signed)
 Sedate, gd SR, tolerated procedure well, VSS, report to RN

## 2023-08-08 NOTE — Patient Instructions (Signed)
 Resume previous diet Continue present medications No NSAIDS (Non-Steroidal anti-inflammatory drugs) .  (These include, aspirin, aspirin-containing products, ibuprofen, advil, motrin, naproxen, aleve, goody powders, etc) Tylenol is ok to take as needed, see label for instructions. Await pathology results  Handouts/information given for Hiatal Hernia  YOU HAD AN ENDOSCOPIC PROCEDURE TODAY AT THE [facility]:   Refer to the procedure report that was given to you for any specific questions about what was found during the examination.  If the procedure report does not answer your questions, please call your gastroenterologist to clarify.  If you requested that your care partner not be given the details of your procedure findings, then the procedure report has been included in a sealed envelope for you to review at your convenience later.  YOU SHOULD EXPECT: Some feelings of bloating in the abdomen. Passage of more gas than usual.  Walking can help get rid of the air that was put into your GI tract during the procedure and reduce the bloating. If you had a lower endoscopy (such as a colonoscopy or flexible sigmoidoscopy) you may notice spotting of blood in your stool or on the toilet paper. If you underwent a bowel prep for your procedure, you may not have a normal bowel movement for a few days.  Please Note:  You might notice some irritation and congestion in your nose or some drainage.  This is from the oxygen used during your procedure.  There is no need for concern and it should clear up in a day or so.  SYMPTOMS TO REPORT IMMEDIATELY:  Following upper endoscopy (EGD)  Vomiting of blood or coffee ground material  New chest pain or pain under the shoulder blades  Painful or persistently difficult swallowing  New shortness of breath  Fever of 100F or higher  Black, tarry-looking stools  For urgent or emergent issues, a gastroenterologist can be reached at any hour by calling [phone]  [phone]. Do not use MyChart messaging for urgent concerns.    DIET:  We do recommend a small meal at first, but then you may proceed to your regular diet.  Drink plenty of fluids but you should avoid alcoholic beverages for 24 hours.  ACTIVITY:  You should plan to take it easy for the rest of today and you should NOT DRIVE or use heavy machinery until tomorrow (because of the sedation medicines used during the test).    FOLLOW UP: Our staff will call the number listed on your records the next business day following your procedure.  We will call around 7:15- 8:00 am to check on you and address any questions or concerns that you may have regarding the information given to you following your procedure. If we do not reach you, we will leave a message.     If any biopsies were taken you will be contacted by phone or by letter within the next 1-3 weeks.  Please call us at [phone] if you have not heard about the biopsies in 3 weeks.    SIGNATURES/CONFIDENTIALITY: You and/or your care partner have signed paperwork which will be entered into your electronic medical record.  These signatures attest to the fact that that the information above on your After Visit Summary has been reviewed and is understood.  Full responsibility of the confidentiality of this discharge information lies with you and/or your care-partner.

## 2023-08-08 NOTE — Progress Notes (Signed)
 Pt's states no medical or surgical changes since previsit or office visit.

## 2023-08-08 NOTE — Progress Notes (Signed)
 History and Physical Interval Note:  08/08/2023 1:28 PM  Gabriela Shepard  has presented today for endoscopic procedure(s), with the diagnosis of  Encounter Diagnosis  Name Primary?   Abdominal pain, epigastric Yes  .  The various methods of evaluation and treatment have been discussed with the patient and/or family. After consideration of risks, benefits and other options for treatment, the patient has consented to  the endoscopic procedure(s).   The patient's history has been reviewed, patient examined, no change in status, stable for endoscopic procedure(s).  I have reviewed the patient's chart and labs.  Questions were answered to the patient's satisfaction.     Gabriela Nicotra E. Tomasa Rand, MD [facility]

## 2023-08-08 NOTE — Progress Notes (Signed)
 Addendum: Reviewed and agree with assessment and management plan. Asha Grumbine, Carie Caddy, MD

## 2023-08-08 NOTE — Progress Notes (Signed)
 Called to room to assist during endoscopic procedure.  Patient ID and intended procedure confirmed with present staff. Received instructions for my participation in the procedure from the performing physician.

## 2023-08-08 NOTE — Op Note (Signed)
 [facility] Patient Name: Gabriela Shepard Procedure Date: 08/08/2023 1:24 PM MRN: [id number] Endoscopist: Lorin Picket E. Tomasa Rand , MD, [id number] Age: 63 Referring MD:  Date of Birth: [birth year] Gender: Female Account #: [id number] Procedure:                Upper GI endoscopy Indications:              Epigastric abdominal pain Medicines:                Monitored Anesthesia Care Procedure:                Pre-Anesthesia Assessment:                           - Prior to the procedure, a History and Physical                            was performed, and patient medications and                            allergies were reviewed. The patient's tolerance of                            previous anesthesia was also reviewed. The risks                            and benefits of the procedure and the sedation                            options and risks were discussed with the patient.                            All questions were answered, and informed consent                            was obtained. Prior Anticoagulants: The patient has                            taken no anticoagulant or antiplatelet agents. ASA                            Grade Assessment: II - A patient with mild systemic                            disease. After reviewing the risks and benefits,                            the patient was deemed in satisfactory condition to                            undergo the procedure.                           After obtaining informed consent, the endoscope was  passed under direct vision. Throughout the                            procedure, the patient's blood pressure, pulse, and                            oxygen saturations were monitored continuously. The                            GIF [id number] #[id number] was introduced through the                            mouth, and advanced to the second part of duodenum.                            The upper GI endoscopy  was accomplished without                            difficulty. The patient tolerated the procedure                            well. Scope In: Scope Out: Findings:                 Localized mild erythema was found at the                            gastroesophageal junction.                           The exam of the esophagus was otherwise normal.                           A 4 cm hiatal hernia was present.                           The entire examined stomach was normal. Biopsies                            were taken with a cold forceps for Helicobacter                            pylori testing. Estimated blood loss was minimal.                           The examined duodenum was normal. Complications:            No immediate complications. Estimated Blood Loss:     Estimated blood loss was minimal. Impression:               - Erythema at the gastroesophageal junction without                            overt mucosal breaks.                           -  4 cm hiatal hernia.                           - Normal stomach. Biopsied.                           - Normal examined duodenum.                           - Patient's epigastric pain may have been related                            to acid reflux. Recommendation:           - Patient has a contact number available for                            emergencies. The signs and symptoms of potential                            delayed complications were discussed with the                            patient. Return to normal activities tomorrow.                            Written discharge instructions were provided to the                            patient.                           - Resume previous diet.                           - Continue present medications.                           - Await pathology results.                           - Continue once daily omeprazole.                           - Would continue to avoid/limit NSAID use if                             possible. Amery Minasyan E. Tomasa Rand, MD 08/08/2023 1:58:46 PM This report has been signed electronically.

## 2023-08-09 ENCOUNTER — Telehealth: Payer: Self-pay

## 2023-08-09 NOTE — Telephone Encounter (Signed)
  Follow up Call-     08/08/2023    1:20 PM 11/18/2020   10:07 AM  Call back number  Post procedure Call Back phone  # [phone]  Permission to leave phone message Yes Yes     Post op call attempted, no answer, left VM.

## 2023-08-11 LAB — SURGICAL PATHOLOGY

## 2023-08-16 ENCOUNTER — Encounter: Payer: Self-pay | Admitting: Gastroenterology

## 2023-08-16 NOTE — Progress Notes (Signed)
 Gabriela Shepard,  The biopsies taken from your stomach were notable for mild reactive gastropathy which is a common finding and often related to use of certain medications (usually NSAIDs), but there was no evidence of Helicobacter pylori infection. This common finding is not felt to necessarily be a cause of any particular symptom and there is no specific treatment or further evaluation recommended. Your symptoms may be from acid reflux.  Please take the omeprazole every day to see if your symptoms improve.

## 2023-08-26 DIAGNOSIS — F439 Reaction to severe stress, unspecified: Secondary | ICD-10-CM | POA: Diagnosis not present

## 2023-08-30 ENCOUNTER — Telehealth: Payer: Self-pay

## 2023-08-30 ENCOUNTER — Encounter: Payer: Self-pay | Admitting: Family Medicine

## 2023-08-30 ENCOUNTER — Ambulatory Visit: Admitting: Family Medicine

## 2023-08-30 VITALS — BP 118/72 | HR 168 | Temp 98.6°F | Ht 64.0 in | Wt 156.6 lb

## 2023-08-30 DIAGNOSIS — R051 Acute cough: Secondary | ICD-10-CM

## 2023-08-30 DIAGNOSIS — L089 Local infection of the skin and subcutaneous tissue, unspecified: Secondary | ICD-10-CM

## 2023-08-30 MED ORDER — BENZONATATE 100 MG PO CAPS: 100.0000 mg | ORAL_CAPSULE | ORAL | 0 refills | Status: DC

## 2023-08-30 MED ORDER — ALBUTEROL SULFATE HFA 108 (90 BASE) MCG/ACT IN AERS: 2.0000 | INHALATION_SPRAY | Freq: Four times a day (QID) | RESPIRATORY_TRACT | 0 refills | Status: DC | PRN

## 2023-08-30 MED ORDER — DOXYCYCLINE HYCLATE 100 MG PO CAPS: 100.0000 mg | ORAL_CAPSULE | Freq: Two times a day (BID) | ORAL | 0 refills | Status: DC

## 2023-08-30 NOTE — Telephone Encounter (Signed)
 Copied from CRM [id number]. Topic: Clinical - Prescription Issue >> Aug 30, 2023 12:48 PM Eunice Blase wrote: Reason for CRM: Pt was seen today by Dr. Caryl Never, pt needs something for her cough. Please call into [id number] [place], [place] - [address] [place] [address] Phone: [phone] Fax: [phone]

## 2023-08-30 NOTE — Progress Notes (Signed)
 Established Patient Office Visit  Subjective   Patient ID: Gabriela Shepard, female    DOB: 13-Feb-1961  Age: 63 y.o. MRN: [id number]  Chief Complaint  Patient presents with   Follow-up    Cough, SOB     HPI   Gabriela Shepard is seen with recurrent dry cough.  She had fairly severe cough several weeks ago and eventually improved after course of antibiotics.  Did have some recurrent cough starting over a week ago.  She is concerned because she is flying out to see her daughter in [place] later this week.  She denies any fever.  She found an inhaler that had belonged to her mother which was presumably albuterol and seemed to help slightly.  She is not aware of any obvious wheezing.  She did quit smoking back in 2012.  She also has separate problem of small pustule mid upper lip which just came up yesterday.  No vesicles.  No history of cold sores.  No known history of MRSA.  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   History of UTI    Interstitial cystitis    Low sodium levels    Migraines    Osteoporosis    PONV (postoperative nausea and vomiting)    PTSD (post-traumatic stress disorder)    on meds   STD (sexually transmitted disease)    Substance abuse (HCC)    hx of alcohol   Urine incontinence    Past Surgical History:  Procedure Laterality Date   BREAST BIOPSY Left    2017 benign x 2   CESAREAN SECTION  1992   COLONOSCOPY  2018   JMP-MAC-suprep(good)-polyps   INCONTINENCE SURGERY     LAPAROSCOPY     under bladder   NECK SURGERY  02/24/2018   ACDF   POLYPECTOMY  2018   polyps removed   TOTAL HIP ARTHROPLASTY Right 09/08/2020   TOTAL HIP ARTHROPLASTY  Left 2018   TOTAL KNEE ARTHROPLASTY Left 12/23/2015   Procedure: TOTAL KNEE ARTHROPLASTY;  Surgeon: Marcene Corning, MD;  Location: [facility] OR;  Service: Orthopedics;  Laterality: Left;    reports that she quit smoking about 12 years ago. Her smoking use included cigarettes. She started smoking about 22 years ago. She has a 5 pack-year smoking history. She has never used smokeless tobacco. She reports that she does not drink alcohol and does not use drugs. family history includes Alcohol abuse in her brother and another family member; Alcoholism in her brother; Arthritis in an other family member; Cancer (age of onset: 51) in her father; Drug abuse in her sister; Healthy in her daughter and son; Heart disease in her father; Hypertension in her maternal grandmother, mother, and another family member; Kidney cancer in her father; Lung cancer in her mother; Mental illness in an other family member; Ulcers in her father. Allergies  Allergen Reactions   Alendronate Other (See Comments)    "Extreme gas and heartburn, makes me sick"   Sulfa Antibiotics Hives    Review of Systems  Constitutional:  Negative for chills and  fever.  Respiratory:  Positive for cough. Negative for hemoptysis, sputum production, shortness of breath and wheezing.   Cardiovascular:  Negative for chest pain.      Objective:     BP 118/72 (BP Location: Right Arm, Patient Position: Sitting, Cuff Size: Normal)   Pulse (!) 168   Temp 98.6 F (37 C) (Oral)   Ht 5\' 4"  (1.626 m)   Wt 156 lb 9.6 oz (71 kg)   LMP 06/07/2010 (Approximate)   SpO2 98%   BMI 26.88 kg/m  BP Readings from Last 3 Encounters:  08/30/23 118/72  08/08/23 122/88  08/04/23 118/60   Wt Readings from Last 3 Encounters:  08/30/23 156 lb 9.6 oz (71 kg)  08/08/23 156 lb (70.8 kg)  08/04/23 156 lb 6.4 oz (70.9 kg)      Physical Exam Vitals reviewed.  Constitutional:      General: She is not in acute distress.    Appearance: She is not ill-appearing.   HENT:     Right Ear: Tympanic membrane normal.     Left Ear: Tympanic membrane normal.  Cardiovascular:     Rate and Rhythm: Normal rate and regular rhythm.  Pulmonary:     Effort: Pulmonary effort is normal.     Breath sounds: Normal breath sounds. No wheezing or rales.  Skin:    Comments: Very small pinpoint pustule mid upper lip.  Approximately 2 mm of surrounding erythema at the base.  Nonfluctuant.  Neurological:     Mental Status: She is alert.      No results found for any visits on 08/30/23.    The 10-year ASCVD risk score (Arnett DK, et al., 2019) is: 3%    Assessment & Plan:   #1 cough.  Suspect acute bronchitis.  Patient did have leftover albuterol inhaler which seemed to help her coughing although no wheezing noted on exam at this time.  We did write for albuterol MDI to use 2 puffs every 4-6 hours as needed for cough/wheeze.  Be in touch if cough not resolving over the next couple weeks.  #2 very small pustule mid upper lip.  Nonfluctuant.  Start doxycycline 100 mg twice daily for 7 days.  Follow-up for any progressive erythema or other concerns   [name], MD

## 2023-09-09 ENCOUNTER — Encounter: Payer: Self-pay | Admitting: Family Medicine

## 2023-09-14 DIAGNOSIS — F439 Reaction to severe stress, unspecified: Secondary | ICD-10-CM | POA: Diagnosis not present

## 2023-09-14 DIAGNOSIS — S51802A Unspecified open wound of left forearm, initial encounter: Secondary | ICD-10-CM | POA: Diagnosis not present

## 2023-09-14 DIAGNOSIS — D3701 Neoplasm of uncertain behavior of lip: Secondary | ICD-10-CM | POA: Diagnosis not present

## 2023-09-14 DIAGNOSIS — L821 Other seborrheic keratosis: Secondary | ICD-10-CM | POA: Diagnosis not present

## 2023-09-14 DIAGNOSIS — L738 Other specified follicular disorders: Secondary | ICD-10-CM | POA: Diagnosis not present

## 2023-09-21 ENCOUNTER — Other Ambulatory Visit: Payer: Self-pay | Admitting: Family Medicine

## 2023-10-03 DIAGNOSIS — M5412 Radiculopathy, cervical region: Secondary | ICD-10-CM | POA: Diagnosis not present

## 2023-10-09 ENCOUNTER — Encounter: Payer: Self-pay | Admitting: Family Medicine

## 2023-10-10 MED ORDER — PROCTOFOAM HC 1-1 % EX FOAM: 1.0000 | Freq: Three times a day (TID) | CUTANEOUS | 0 refills | Status: DC

## 2023-10-11 ENCOUNTER — Other Ambulatory Visit: Payer: Self-pay | Admitting: Physical Medicine and Rehabilitation

## 2023-10-11 ENCOUNTER — Other Ambulatory Visit: Payer: Self-pay | Admitting: Family Medicine

## 2023-10-11 DIAGNOSIS — M542 Cervicalgia: Secondary | ICD-10-CM

## 2023-10-14 DIAGNOSIS — F439 Reaction to severe stress, unspecified: Secondary | ICD-10-CM | POA: Diagnosis not present

## 2023-10-24 ENCOUNTER — Ambulatory Visit
Admission: RE | Admit: 2023-10-24 | Discharge: 2023-10-24 | Disposition: A | Discharge status: Home / Self Care | Source: Ambulatory Visit | Attending: Physical Medicine and Rehabilitation | Admitting: Physical Medicine and Rehabilitation

## 2023-10-24 DIAGNOSIS — Z981 Arthrodesis status: Secondary | ICD-10-CM | POA: Diagnosis not present

## 2023-10-24 DIAGNOSIS — M5412 Radiculopathy, cervical region: Secondary | ICD-10-CM | POA: Diagnosis not present

## 2023-10-24 DIAGNOSIS — M542 Cervicalgia: Secondary | ICD-10-CM

## 2023-10-25 ENCOUNTER — Ambulatory Visit: Admitting: Pulmonary Disease

## 2023-11-03 ENCOUNTER — Other Ambulatory Visit: Payer: Self-pay | Admitting: Family Medicine

## 2023-11-07 DIAGNOSIS — M47812 Spondylosis without myelopathy or radiculopathy, cervical region: Secondary | ICD-10-CM | POA: Diagnosis not present

## 2023-11-07 DIAGNOSIS — M47816 Spondylosis without myelopathy or radiculopathy, lumbar region: Secondary | ICD-10-CM | POA: Diagnosis not present

## 2023-11-11 DIAGNOSIS — F439 Reaction to severe stress, unspecified: Secondary | ICD-10-CM | POA: Diagnosis not present

## 2023-11-18 DIAGNOSIS — F439 Reaction to severe stress, unspecified: Secondary | ICD-10-CM | POA: Diagnosis not present

## 2023-11-23 DIAGNOSIS — F439 Reaction to severe stress, unspecified: Secondary | ICD-10-CM | POA: Diagnosis not present

## 2023-11-28 ENCOUNTER — Encounter: Payer: Self-pay | Admitting: Family Medicine

## 2023-11-28 ENCOUNTER — Ambulatory Visit: Admitting: Family Medicine

## 2023-11-28 ENCOUNTER — Ambulatory Visit: Payer: Self-pay | Admitting: Family Medicine

## 2023-11-28 VITALS — BP 124/66 | HR 76 | Wt 144.4 lb

## 2023-11-28 DIAGNOSIS — M791 Myalgia, unspecified site: Secondary | ICD-10-CM

## 2023-11-28 DIAGNOSIS — F419 Anxiety disorder, unspecified: Secondary | ICD-10-CM

## 2023-11-28 DIAGNOSIS — M797 Fibromyalgia: Secondary | ICD-10-CM

## 2023-11-28 LAB — SEDIMENTATION RATE: Sed Rate: 22 mm/h (ref 0–30)

## 2023-11-28 LAB — VITAMIN D 25 HYDROXY (VIT D DEFICIENCY, FRACTURES): VITD: 46.3 ng/mL (ref 30.00–100.00)

## 2023-11-28 MED ORDER — CLONAZEPAM 1 MG PO TABS: ORAL_TABLET | ORAL | 1 refills | Status: DC

## 2023-11-28 MED ORDER — METHOCARBAMOL 750 MG PO TABS: 750.0000 mg | ORAL_TABLET | Freq: Four times a day (QID) | ORAL | 1 refills | Status: DC

## 2023-11-28 NOTE — Progress Notes (Signed)
 Established Patient Office Visit  Subjective   Patient ID: Gabriela Shepard, female    DOB: [birth year]  Age: 63 y.o. MRN: [id number]  Chief Complaint  Patient presents with   Muscle Pain    HPI   Gabriela Shepard is here with what she describes as whole body aches .  She has had some muscle stiffness especially past couple days after being outside and doing some walking and spent some time in the pool.  She feels like being in the heat may have exacerbated.  She does have reported history of fibromyalgia.  She tried multiple muscle relaxers in the past with limited success.  She did notice significant amount of pain involving her neck and upper back but also lower extremities as well.  She currently does not take any vitamin D  supplement.  She is noted that her IC symptoms seem to be worse recently as well.  Denies any recent fever or chills.  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   History of UTI    Interstitial cystitis    Low sodium levels    Migraines    Osteoporosis    PONV (postoperative nausea and vomiting)    PTSD (post-traumatic stress disorder)    on meds   STD (sexually transmitted disease)    Substance abuse (HCC)    hx of alcohol   Urine incontinence    Past Surgical History:  Procedure Laterality Date   BREAST BIOPSY Left    2017 benign x 2   CESAREAN SECTION  1992   COLONOSCOPY  2018   JMP-MAC-suprep(good)-polyps   INCONTINENCE SURGERY     LAPAROSCOPY     under bladder   NECK SURGERY  02/24/2018   ACDF   POLYPECTOMY  2018   polyps removed   TOTAL HIP ARTHROPLASTY Right 09/08/2020   TOTAL HIP ARTHROPLASTY Left 2018   TOTAL KNEE  ARTHROPLASTY Left 12/23/2015   Procedure: TOTAL KNEE ARTHROPLASTY;  Surgeon: Maude Herald, MD;  Location: [facility] OR;  Service: Orthopedics;  Laterality: Left;    reports that she quit smoking about 13 years ago. Her smoking use included cigarettes. She started smoking about 23 years ago. She has a 5 pack-year smoking history. She has never used smokeless tobacco. She reports that she does not drink alcohol and does not use drugs. family history includes Alcohol abuse in her brother and another family member; Alcoholism in her brother; Arthritis in an other family member; Cancer (age of onset: 56) in her father; Drug abuse in her sister; Healthy in her daughter and son; Heart disease in her father; Hypertension in her maternal grandmother, mother, and another family member; Kidney cancer in her father; Lung cancer in her mother; Mental illness in an other family member; Ulcers in her father. Allergies  Allergen Reactions   Alendronate  Other (See Comments)    Extreme gas and heartburn, makes me sick   Sulfa Antibiotics Hives    Review of Systems  Constitutional:  Negative for chills, fever and malaise/fatigue.  Eyes:  Negative for blurred vision.  Respiratory:  Negative for shortness of breath.   Cardiovascular:  Negative for chest pain.  Gastrointestinal:  Negative for abdominal pain.  Musculoskeletal:  Positive for joint pain and myalgias.  Neurological:  Negative for dizziness, weakness and headaches.      Objective:     BP 124/66 (BP Location: Left Arm, Patient Position: Sitting, Cuff Size: Normal)   Pulse 76   Wt 144 lb 6.4 oz (65.5 kg)   LMP 06/07/2010 (Approximate)   SpO2 97%   BMI 24.79 kg/m  BP Readings from Last 3 Encounters:  11/28/23 124/66  08/30/23 118/72  08/08/23 122/88   Wt Readings from Last 3 Encounters:  11/28/23 144 lb 6.4 oz (65.5 kg)  08/30/23 156 lb 9.6 oz (71 kg)  08/08/23 156 lb (70.8 kg)      Physical Exam Vitals reviewed.  Constitutional:       General: She is not in acute distress.    Appearance: She is well-developed. She is not ill-appearing.   Eyes:     Pupils: Pupils are equal, round, and reactive to light.   Neck:     Thyroid : No thyromegaly.     Vascular: No JVD.   Cardiovascular:     Rate and Rhythm: Normal rate and regular rhythm.     Heart sounds:     No gallop.  Pulmonary:     Effort: Pulmonary effort is normal. No respiratory distress.     Breath sounds: Normal breath sounds. No wheezing or rales.   Musculoskeletal:     Cervical back: Neck supple.     Right lower leg: No edema.     Left lower leg: No edema.   Neurological:     Mental Status: She is alert.      No results found for any visits on 11/28/23.  Last CBC Lab Results  Component Value Date   WBC 6.9 06/14/2023   HGB 13.1 06/14/2023   HCT 39.7 06/14/2023   MCV 91.8 06/14/2023   MCH 29.9 06/02/2021   RDW 12.8 06/14/2023   PLT 362.0 06/14/2023   Last metabolic panel Lab Results  Component Value Date   GLUCOSE 119 (H) 06/14/2023   NA 129 (L) 06/14/2023   K 4.2 06/14/2023   CL 94 (L) 06/14/2023   CO2 29 06/14/2023   BUN 14 06/14/2023   CREATININE 0.73 06/14/2023   GFR 88.09 06/14/2023   CALCIUM 8.7 06/14/2023   PROT 6.8 06/14/2023   ALBUMIN 4.1 06/14/2023   BILITOT 0.2 06/14/2023   ALKPHOS 52 06/14/2023   AST 15 06/14/2023   ALT 15 06/14/2023   ANIONGAP 8 06/02/2021   Last thyroid  functions Lab Results  Component Value Date   TSH 0.95 06/14/2023   Last vitamin B12 and Folate Lab Results  Component Value Date   VITAMINB12 868 06/14/2023      The 10-year ASCVD risk score (Arnett DK, et al., 2019) is: 3.3%    Assessment & Plan:   #1 myalgia.  Symptoms seem to come on fairly abruptly recently over the weekend.  She does have reported history of fibromyalgia.  Has had modest success to very limited success with muscle relaxer in the past.  We discussed the following  -Check 25-hydroxy vitamin D  - check sed rate.   Doubt PMR by clinical presentation - Consider higher dose of methocarbamol  750 mg every 6 hours as needed - Discussed anti-inflammatory type diet high in omega-3's and low in high glycemic and processed foods.  She is also tried supplements such  as turmeric in the past with limited success. - Discussed other potential therapies for fibromyalgia but she would like to limit medications at this time  #2 intermittent anxiety.  Patient takes clonazepam  infrequently for severe symptoms.  Refill provided.   No follow-ups on file.    [name], MD

## 2023-12-06 ENCOUNTER — Encounter: Payer: Self-pay | Admitting: Family Medicine

## 2023-12-06 ENCOUNTER — Other Ambulatory Visit: Payer: Self-pay | Admitting: Family Medicine

## 2023-12-11 NOTE — Telephone Encounter (Signed)
 Noted.  Kristian Covey MD [facility] at [facility]

## 2023-12-13 DIAGNOSIS — M47812 Spondylosis without myelopathy or radiculopathy, cervical region: Secondary | ICD-10-CM | POA: Diagnosis not present

## 2023-12-20 ENCOUNTER — Other Ambulatory Visit: Payer: Self-pay | Admitting: Family Medicine

## 2023-12-28 ENCOUNTER — Other Ambulatory Visit: Payer: Self-pay | Admitting: Medical Genetics

## 2023-12-31 ENCOUNTER — Other Ambulatory Visit: Payer: Self-pay | Admitting: Family Medicine

## 2024-01-04 ENCOUNTER — Encounter: Payer: Self-pay | Admitting: Family Medicine

## 2024-01-04 ENCOUNTER — Telehealth: Payer: Self-pay

## 2024-01-04 ENCOUNTER — Ambulatory Visit: Admitting: Family Medicine

## 2024-01-04 VITALS — BP 110/66 | HR 60 | Temp 97.6°F | Wt 143.5 lb

## 2024-01-04 DIAGNOSIS — N3010 Interstitial cystitis (chronic) without hematuria: Secondary | ICD-10-CM | POA: Diagnosis not present

## 2024-01-04 DIAGNOSIS — N898 Other specified noninflammatory disorders of vagina: Secondary | ICD-10-CM | POA: Diagnosis not present

## 2024-01-04 DIAGNOSIS — R300 Dysuria: Secondary | ICD-10-CM

## 2024-01-04 LAB — POC URINALSYSI DIPSTICK (AUTOMATED)
Bilirubin, UA: NEGATIVE
Blood, UA: NEGATIVE
Glucose, UA: NEGATIVE
Ketones, UA: NEGATIVE
Leukocytes, UA: NEGATIVE
Nitrite, UA: NEGATIVE
Protein, UA: NEGATIVE
Spec Grav, UA: 1.01 (ref 1.010–1.025)
Urobilinogen, UA: 0.2 U/dL
pH, UA: 6.5 (ref 5.0–8.0)

## 2024-01-04 MED ORDER — METRONIDAZOLE 500 MG PO TABS
500.0000 mg | ORAL_TABLET | Freq: Two times a day (BID) | ORAL | 0 refills | Status: AC
Start: 2024-01-04 — End: 2024-01-11

## 2024-01-04 NOTE — Progress Notes (Signed)
 Established Patient Office Visit  Subjective   Patient ID: Gabriela Shepard, female    DOB: [birth year]  Age: 63 y.o. MRN: [id number]  Chief Complaint  Patient presents with   Dysuria   Nausea   Vaginal Discharge    HPI   Antanette is seen with concern for possible UTI.  She has had some increased urinary frequency and urgency and occasional burning with urination.  She does have history of interstitial cystitis and difficult to sort out UTI from IC symptoms.  Additionally though she had some greenish vaginal discharge.  Not sexually active.  No recent fever.  No vaginal bleeding.  Has had some recent nonspecific nausea.  Ongoing cervical neck pain.  She has seen multiple orthopedic specialist for that.  Unfortunately pain did not improve with nerve ablation.  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   History of UTI    Interstitial cystitis    Low sodium levels    Migraines    Osteoporosis    PONV (postoperative nausea and vomiting)    PTSD (post-traumatic stress disorder)    on meds   STD (sexually transmitted disease)    Substance abuse (HCC)    hx of alcohol   Urine incontinence    Past Surgical History:  Procedure Laterality Date   BREAST BIOPSY Left    2017 benign x 2   CESAREAN SECTION  1992   COLONOSCOPY  2018   JMP-MAC-suprep(good)-polyps   INCONTINENCE SURGERY     LAPAROSCOPY     under bladder   NECK SURGERY  02/24/2018   ACDF   POLYPECTOMY  2018   polyps removed   TOTAL HIP ARTHROPLASTY Right 09/08/2020   TOTAL HIP ARTHROPLASTY Left 2018   TOTAL KNEE ARTHROPLASTY Left 12/23/2015   Procedure: TOTAL KNEE ARTHROPLASTY;  Surgeon: Maude Herald, MD;  Location: [facility] OR;  Service: Orthopedics;  Laterality: Left;    reports that she quit smoking about 13 years ago. Her smoking use included cigarettes. She started smoking about 23 years ago. She has a 5 pack-year smoking history. She has never used smokeless tobacco. She reports that she does not drink alcohol and does not use drugs. family history includes Alcohol abuse in her brother and another family member; Alcoholism in her brother; Arthritis in an other family member; Cancer (age of onset: 38) in her father; Drug abuse in her sister; Healthy in her daughter and son; Heart disease in her father; Hypertension in her maternal grandmother, mother, and another family member; Kidney cancer in her father; Lung cancer in her mother; Mental illness in an other family member; Ulcers in her father. Allergies  Allergen Reactions   Alendronate  Other (See Comments)    Extreme gas and heartburn, makes me sick   Sulfa Antibiotics Hives    Review of Systems  Constitutional:  Negative for chills and fever.  Gastrointestinal:  Positive for nausea.  Genitourinary:  Positive for frequency and urgency. Negative for flank pain and hematuria.      Objective:     BP 110/66 (BP  Location: Left Arm, Patient Position: Sitting, Cuff Size: Normal)   Pulse 60   Temp 97.6 F (36.4 C) (Oral)   Wt 143 lb 8 oz (65.1 kg)   LMP 06/07/2010 (Approximate)   SpO2 98%   BMI 24.63 kg/m  BP Readings from Last 3 Encounters:  01/04/24 110/66  11/28/23 124/66  08/30/23 118/72   Wt Readings from Last 3 Encounters:  01/04/24 143 lb 8 oz (65.1 kg)  11/28/23 144 lb 6.4 oz (65.5 kg)  08/30/23 156 lb 9.6 oz (71 kg)      Physical Exam Vitals reviewed.  Constitutional:      General: She is not in acute distress.    Appearance: She is not ill-appearing.  Cardiovascular:     Rate and Rhythm: Normal rate and regular rhythm.  Neurological:     Mental Status: She is alert.      Results for orders  placed or performed in visit on 01/04/24  POCT Urinalysis Dipstick (Automated)  Result Value Ref Range   Color, UA Yellow    Clarity, UA Clear    Glucose, UA Negative Negative   Bilirubin, UA Negative    Ketones, UA Negative    Spec Grav, UA 1.010 1.010 - 1.025   Blood, UA Negative    pH, UA 6.5 5.0 - 8.0   Protein, UA Negative Negative   Urobilinogen, UA 0.2 0.2 or 1.0 E.U./dL   Nitrite, UA Negative    Leukocytes, UA Negative Negative      The 10-year ASCVD risk score (Arnett DK, et al., 2019) is: 2.9%    Assessment & Plan:   Problem List Items Addressed This Visit   None Visit Diagnoses       Dysuria    -  Primary   Relevant Orders   POCT Urinalysis Dipstick (Automated) (Completed)     Patient relates increased urine frequency and urgency along with some burning.  Urine dipstick today is completely normal.  No evidence for UTI.  Suspect urinary symptoms related to interstitial cystitis.  She is describing some vaginal discharge and we discussed etiologies such as bacterial vaginosis.  She is not sexually active so no risk for STD.  We did discuss possible empiric treatment with metronidazole  500 mg twice daily for 7 days for possible BV.  She is encouraged to follow-up with her IC specialist regarding ongoing urinary symptoms.  No follow-ups on file.    [name], MD

## 2024-01-04 NOTE — Telephone Encounter (Signed)
 Copied from CRM [id number]. Topic: Clinical - Red Word Triage >> Jan 04, 2024  9:16 AM Gabriela Shepard wrote: Red Word that prompted transfer to Nurse Triage: Pt went to the restroom and when she wiped it was green  PAS transferred call to agent.  When patient came on line: nurse attempted to triage patient however patient keep stated I already have an appointment , I don't need to talk to anyone else because my appointment in 10 minutes and I am about 5 minutes from the clinic.  Patient disconnected call.

## 2024-01-05 ENCOUNTER — Other Ambulatory Visit: Payer: Self-pay | Admitting: Neurological Surgery

## 2024-01-05 DIAGNOSIS — M542 Cervicalgia: Secondary | ICD-10-CM | POA: Diagnosis not present

## 2024-01-06 ENCOUNTER — Ambulatory Visit: Admitting: Obstetrics and Gynecology

## 2024-01-06 ENCOUNTER — Encounter: Payer: Self-pay | Admitting: Obstetrics and Gynecology

## 2024-01-06 ENCOUNTER — Other Ambulatory Visit: Payer: Self-pay | Admitting: Neurological Surgery

## 2024-01-06 VITALS — BP 139/80 | HR 65

## 2024-01-06 DIAGNOSIS — N952 Postmenopausal atrophic vaginitis: Secondary | ICD-10-CM | POA: Diagnosis not present

## 2024-01-06 DIAGNOSIS — N951 Menopausal and female climacteric states: Secondary | ICD-10-CM | POA: Diagnosis not present

## 2024-01-06 DIAGNOSIS — N3281 Overactive bladder: Secondary | ICD-10-CM

## 2024-01-06 LAB — POCT URINALYSIS DIP (CLINITEK)
Bilirubin, UA: NEGATIVE
Blood, UA: NEGATIVE
Glucose, UA: NEGATIVE mg/dL
Ketones, POC UA: NEGATIVE mg/dL
Leukocytes, UA: NEGATIVE
Nitrite, UA: NEGATIVE
POC PROTEIN,UA: NEGATIVE
Spec Grav, UA: 1.01 (ref 1.010–1.025)
Urobilinogen, UA: 0.2 U/dL
pH, UA: 6 (ref 5.0–8.0)

## 2024-01-06 MED ORDER — ESTRADIOL 0.1 MG/GM VA CREA: 0.5000 g | TOPICAL_CREAM | VAGINAL | 11 refills | Status: AC

## 2024-01-06 MED ORDER — MIRABEGRON ER 25 MG PO TB24: 25.0000 mg | ORAL_TABLET | Freq: Every day | ORAL | 5 refills | Status: AC

## 2024-01-06 NOTE — Progress Notes (Signed)
 [facility] Return Visit  SUBJECTIVE  History of Present Illness: Gabriela Shepard is a 63 y.o. female seen in follow-up for IC.   Feels she is not sleeping well. Has some joint aches for osteoarthritis. Wiped the other day and it was a green color. She took flagyl  from her PCP for possible BV. Has not noticed any discharge recently.   She has been using the pelvic wand regularly which has been helping with her pain.   She has been peeing all night long. Voiding up to 6-7 times per night due to urgency. Has never been on medication of OAB. She lives alone but has been told that she snores. Not drinking caffeine.   Has been using yuvafem  for vaginal dryness but prefers a cream that she can also put on the labia. She is also interested in systemic HRT.    Past Medical History: Patient  has a past medical history of Allergy, Arthritis, Bowel habit changes, Depression, Flatulence, GERD (gastroesophageal reflux disease), Headache, History of alcohol abuse, History of UTI, Interstitial cystitis, Low sodium levels, Migraines, Osteoporosis, PONV (postoperative nausea and vomiting), PTSD (post-traumatic stress disorder), STD (sexually transmitted disease), Substance abuse (HCC), and Urine incontinence.   Past Surgical History: She  has a past surgical history that includes Cesarean section (1992); laparoscopy; Total knee arthroplasty (Left, 12/23/2015); Breast biopsy (Left); Neck surgery (02/24/2018); Total hip arthroplasty (Right, 09/08/2020); Incontinence surgery; Total hip arthroplasty (Left, 2018); Colonoscopy (2018); and Polypectomy (2018).   Medications: She has a current medication list which includes the following prescription(s): albuterol , vitamin d3, clobetasol  cream, clobetasol  propionate, clonazepam , diazepam , diclofenac , dicyclomine , docusate sodium , escitalopram , [START ON 01/09/2024] estradiol , fluticasone , gabapentin , hydrocortisone -pramoxine, hydroxyzine ,  lidocaine -prilocaine , loteprednol, melatonin, methocarbamol , metronidazole , mirabegron  er, montelukast , NONFORMULARY OR COMPOUNDED ITEM, omeprazole , polyethylene glycol, promethazine , sumatriptan , tizanidine , tramadol , trazodone , valacyclovir , and cetirizine .   Allergies: Patient is allergic to alendronate  and sulfa antibiotics.   Social History: Patient  reports that she quit smoking about 13 years ago. Her smoking use included cigarettes. She started smoking about 23 years ago. She has a 5 pack-year smoking history. She has never used smokeless tobacco. She reports that she does not drink alcohol and does not use drugs.      OBJECTIVE    POC Urine: Negative for signs of infection.  Physical Exam: Vitals:   01/06/24 1420  BP: 139/80  Pulse: 65    Gen: No apparent distress, A&O x 3.  Detailed Urogynecologic Evaluation:   Normal external genitalia. On digital exam, no tenderness of levator muscles on palpation, but there is some tenderness with palpation of obturator internus   Results for orders placed or performed in visit on 01/06/24  POCT URINALYSIS DIP (CLINITEK)   Collection Time: 01/06/24  2:40 PM  Result Value Ref Range   Color, UA yellow yellow   Clarity, UA clear clear   Glucose, UA negative negative mg/dL   Bilirubin, UA negative negative   Ketones, POC UA negative negative mg/dL   Spec Grav, UA 8.989 8.989 - 1.025   Blood, UA negative negative   pH, UA 6.0 5.0 - 8.0   POC PROTEIN,UA negative negative, trace   Urobilinogen, UA 0.2 0.2 or 1.0 E.U./dL   Nitrite, UA Negative Negative   Leukocytes, UA Negative Negative    ASSESSMENT AND PLAN    Ms. [name] is a 63 y.o. with:  1. Overactive bladder   2. Vaginal atrophy   3. Hot flashes due to menopause    - For OAB, will  start a medication. Prescribed Myrbetriq  25mg  daily.  - Prescribe estrace  cream to use instead of the yuvafem  twice weekly - Will refer to GYN for discussion of HRT  Return 6  weeks  Rosaline LOISE Caper, MD

## 2024-01-09 ENCOUNTER — Other Ambulatory Visit: Payer: Self-pay | Admitting: Sports Medicine

## 2024-01-09 DIAGNOSIS — F439 Reaction to severe stress, unspecified: Secondary | ICD-10-CM | POA: Diagnosis not present

## 2024-01-10 ENCOUNTER — Other Ambulatory Visit

## 2024-01-12 DIAGNOSIS — M542 Cervicalgia: Secondary | ICD-10-CM | POA: Diagnosis not present

## 2024-01-12 DIAGNOSIS — M25511 Pain in right shoulder: Secondary | ICD-10-CM | POA: Diagnosis not present

## 2024-01-12 DIAGNOSIS — M1712 Unilateral primary osteoarthritis, left knee: Secondary | ICD-10-CM | POA: Diagnosis not present

## 2024-01-12 DIAGNOSIS — M5459 Other low back pain: Secondary | ICD-10-CM | POA: Diagnosis not present

## 2024-01-12 DIAGNOSIS — M160 Bilateral primary osteoarthritis of hip: Secondary | ICD-10-CM | POA: Diagnosis not present

## 2024-01-12 DIAGNOSIS — G4701 Insomnia due to medical condition: Secondary | ICD-10-CM | POA: Diagnosis not present

## 2024-01-13 DIAGNOSIS — F439 Reaction to severe stress, unspecified: Secondary | ICD-10-CM | POA: Diagnosis not present

## 2024-01-18 DIAGNOSIS — F439 Reaction to severe stress, unspecified: Secondary | ICD-10-CM | POA: Diagnosis not present

## 2024-01-19 DIAGNOSIS — M47816 Spondylosis without myelopathy or radiculopathy, lumbar region: Secondary | ICD-10-CM | POA: Diagnosis not present

## 2024-01-22 ENCOUNTER — Other Ambulatory Visit: Payer: Self-pay | Admitting: Family Medicine

## 2024-01-23 ENCOUNTER — Encounter: Payer: Self-pay | Admitting: Family Medicine

## 2024-01-31 DIAGNOSIS — M9906 Segmental and somatic dysfunction of lower extremity: Secondary | ICD-10-CM | POA: Diagnosis not present

## 2024-01-31 DIAGNOSIS — M9902 Segmental and somatic dysfunction of thoracic region: Secondary | ICD-10-CM | POA: Diagnosis not present

## 2024-01-31 DIAGNOSIS — M5442 Lumbago with sciatica, left side: Secondary | ICD-10-CM | POA: Diagnosis not present

## 2024-01-31 DIAGNOSIS — G8929 Other chronic pain: Secondary | ICD-10-CM | POA: Diagnosis not present

## 2024-01-31 DIAGNOSIS — M9901 Segmental and somatic dysfunction of cervical region: Secondary | ICD-10-CM | POA: Diagnosis not present

## 2024-01-31 DIAGNOSIS — M898X1 Other specified disorders of bone, shoulder: Secondary | ICD-10-CM | POA: Diagnosis not present

## 2024-01-31 DIAGNOSIS — M542 Cervicalgia: Secondary | ICD-10-CM | POA: Diagnosis not present

## 2024-01-31 DIAGNOSIS — M9903 Segmental and somatic dysfunction of lumbar region: Secondary | ICD-10-CM | POA: Diagnosis not present

## 2024-02-01 ENCOUNTER — Encounter: Payer: Self-pay | Admitting: Family Medicine

## 2024-02-01 DIAGNOSIS — N8189 Other female genital prolapse: Secondary | ICD-10-CM

## 2024-02-03 ENCOUNTER — Encounter: Payer: Self-pay | Admitting: Family Medicine

## 2024-02-07 ENCOUNTER — Encounter: Payer: Self-pay | Admitting: Family Medicine

## 2024-02-07 ENCOUNTER — Ambulatory Visit: Admitting: Family Medicine

## 2024-02-07 VITALS — BP 130/80 | HR 69 | Temp 97.4°F | Wt 139.3 lb

## 2024-02-07 DIAGNOSIS — M5412 Radiculopathy, cervical region: Secondary | ICD-10-CM | POA: Diagnosis not present

## 2024-02-07 DIAGNOSIS — G4700 Insomnia, unspecified: Secondary | ICD-10-CM | POA: Diagnosis not present

## 2024-02-07 DIAGNOSIS — R051 Acute cough: Secondary | ICD-10-CM | POA: Diagnosis not present

## 2024-02-07 DIAGNOSIS — G8929 Other chronic pain: Secondary | ICD-10-CM

## 2024-02-07 DIAGNOSIS — M549 Dorsalgia, unspecified: Secondary | ICD-10-CM

## 2024-02-07 MED ORDER — CYCLOBENZAPRINE HCL 10 MG PO TABS: 10.0000 mg | ORAL_TABLET | Freq: Three times a day (TID) | ORAL | 0 refills | Status: DC | PRN

## 2024-02-07 MED ORDER — DULOXETINE HCL 30 MG PO CPEP: ORAL_CAPSULE | ORAL | 1 refills | Status: DC

## 2024-02-07 MED ORDER — MELOXICAM 15 MG PO TABS: 15.0000 mg | ORAL_TABLET | Freq: Every day | ORAL | 5 refills | Status: AC

## 2024-02-07 NOTE — Telephone Encounter (Signed)
 Letter done.  Sent to her through My Chart. Make sure she is aware that this has been done  Wolm LELON Scarlet MD [facility]

## 2024-02-07 NOTE — Progress Notes (Signed)
 Established Patient Office Visit  Subjective   Patient ID: Gabriela Shepard, female    DOB: [birth year]  Age: 63 y.o. MRN: [id number]  No chief complaint on file.   HPI   With has multiple chronic problems including history of migraine headaches, IBS, interstitial cystitis, chronic back pain, osteoporosis.  She has had recent exacerbation of her neck, thoracic, and lumbar pain.  This pain has been very incapacitating.  She has seen multiple specialist.  She states she had ablation procedure back in June for her cervical spine in July for her low back and needed these eradicated her pain.  She may have had some slight improvement initially.  She saw sports medicine specialist recently and was diagnosed with hypermobility syndrome .  She is currently getting massage therapy and working with a trainer and trying to work on nonpharmacologic management much as possible.  She has very incapacitating pain which makes sleep difficult recently.  She also has significant amount of pain with transfers and movement during the day.  Has benefited some from Flexeril  in the past though with 5 mg still struggle to sleep frequently.  We have previously discussed possible trial of Cymbalta .  She thinks she did take this years ago.  Also she has been is from meloxicam  and requesting refill.  She had recently requested a letter to excuse her from upcoming travel to get some travel expenses reimbursed.  We did not feel like it would be medically advised for her to travel at this time.  Her trip would have required several hours of driving  She also relates some cough which is started earlier today.  No fever.  No obvious wheezing.  No hemoptysis.  Past Medical History:  Diagnosis Date   Allergy    seasonal allergies   Arthritis    generalized   Bowel habit changes    been going on couple years   Depression    on meds   Flatulence    excessive with strong odor/uncontrollable   GERD (gastroesophageal  reflux disease)    PRN meds   Headache    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   History of UTI    Interstitial cystitis    Low sodium levels    Migraines    Osteoporosis    PONV (postoperative nausea and vomiting)    PTSD (post-traumatic stress disorder)    on meds   STD (sexually transmitted disease)    Substance abuse (HCC)    hx of alcohol   Urine incontinence    Past Surgical History:  Procedure Laterality Date   BREAST BIOPSY Left    2017 benign x 2   CESAREAN SECTION  1992   COLONOSCOPY  2018   JMP-MAC-suprep(good)-polyps   INCONTINENCE SURGERY     LAPAROSCOPY     under bladder   NECK SURGERY  02/24/2018   ACDF   POLYPECTOMY  2018   polyps removed   TOTAL HIP ARTHROPLASTY Right 09/08/2020   TOTAL HIP ARTHROPLASTY Left 2018   TOTAL KNEE ARTHROPLASTY Left 12/23/2015   Procedure: TOTAL KNEE ARTHROPLASTY;  Surgeon: Maude Herald, MD;  Location: [facility] OR;  Service: Orthopedics;  Laterality: Left;    reports that she quit smoking about 13 years ago. Her smoking use included cigarettes. She started smoking about 23 years ago. She has a 5 pack-year smoking history. She has never used smokeless tobacco. She reports that she does not drink alcohol and does  not use drugs. family history includes Alcohol abuse in her brother and another family member; Alcoholism in her brother; Arthritis in an other family member; Cancer (age of onset: 74) in her father; Drug abuse in her sister; Healthy in her daughter and son; Heart disease in her father; Hypertension in her maternal grandmother, mother, and another family member; Kidney cancer in her father; Lung cancer in her mother; Mental illness in an other family member; Ulcers in her father. Allergies  Allergen Reactions   Alendronate  Other (See Comments)    Extreme gas and heartburn, makes me sick   Sulfa Antibiotics Hives    Review of Systems  Constitutional:  Negative for chills and fever.  Musculoskeletal:   Positive for back pain.  Neurological:  Negative for focal weakness.      Objective:     BP 130/80   Pulse 69   Temp (!) 97.4 F (36.3 C) (Oral)   Wt 139 lb 4.8 oz (63.2 kg)   LMP 06/07/2010 (Approximate)   SpO2 97%   BMI 23.91 kg/m  BP Readings from Last 3 Encounters:  02/07/24 130/80  01/06/24 139/80  01/04/24 110/66   Wt Readings from Last 3 Encounters:  02/07/24 139 lb 4.8 oz (63.2 kg)  01/04/24 143 lb 8 oz (65.1 kg)  11/28/23 144 lb 6.4 oz (65.5 kg)      Physical Exam Vitals reviewed.  Constitutional:      General: She is not in acute distress.    Appearance: She is not ill-appearing.  Cardiovascular:     Rate and Rhythm: Normal rate and regular rhythm.  Pulmonary:     Effort: Pulmonary effort is normal.     Breath sounds: Normal breath sounds. No wheezing or rales.  Neurological:     General: No focal deficit present.     Mental Status: She is alert.      No results found for any visits on 02/07/24.    The 10-year ASCVD risk score (Arnett DK, et al., 2019) is: 4%    Assessment & Plan:   #1 chronic back pain.  She has had chronic cervical, thoracic, and lumbar pain and has seen multiple specialist as above and tried multiple nonpharmacologic management.  We discussed possible adjunct medications to try to help with management.  We discussed the following  -Refilled meloxicam  15 mg 1 daily - Flexeril  10 mg nightly as needed muscle spasm.  She is aware this may cause sedation - Discussed trial of Cymbalta  30 mg once daily for 1 week and then titrate to 60 mg daily and give feedback in about 3 to 4 weeks  #2 acute cough.  Nonfocal exam.  Afebrile.  Follow-up immediately for any fever, increased shortness of breath, or worsening symptoms  #3 chronic intermittent insomnia.  Probably exacerbated by recent chronic back pain.  Hopefully will get some improvement with Flexeril .  [name], MD

## 2024-02-07 NOTE — Patient Instructions (Signed)
 Give me some feedback in about 4 weeks regarding the Cymbalta .

## 2024-02-09 ENCOUNTER — Encounter: Payer: Self-pay | Admitting: Family Medicine

## 2024-02-09 ENCOUNTER — Other Ambulatory Visit: Payer: Self-pay | Admitting: Family Medicine

## 2024-02-09 MED ORDER — DULOXETINE HCL 30 MG PO CPEP: ORAL_CAPSULE | ORAL | 0 refills | Status: DC

## 2024-02-09 NOTE — Telephone Encounter (Signed)
 Copied from CRM [id number]. Topic: Clinical - Medication Question >> Feb 09, 2024 12:34 PM Mia F wrote: Reason for CRM: Pt says he is on a retreat and noticed that she forgot her medication DULoxetine  (CYMBALTA ) 30 MG capsule. She says she only took 4 days worth of the medication of ar and she does not want to stop taking the medication. She is asking if she could get 4 pills to hold her over until she gets back home. She would like rx sent to   [facility] [address] [place], [place] [address]

## 2024-02-09 NOTE — Telephone Encounter (Signed)
 Rx sent.

## 2024-02-10 DIAGNOSIS — F439 Reaction to severe stress, unspecified: Secondary | ICD-10-CM | POA: Diagnosis not present

## 2024-02-16 ENCOUNTER — Ambulatory Visit: Admitting: Radiology

## 2024-02-16 ENCOUNTER — Encounter: Payer: Self-pay | Admitting: Radiology

## 2024-02-16 VITALS — BP 124/70 | HR 81 | Ht 63.75 in | Wt 140.0 lb

## 2024-02-16 DIAGNOSIS — N951 Menopausal and female climacteric states: Secondary | ICD-10-CM | POA: Diagnosis not present

## 2024-02-16 DIAGNOSIS — B009 Herpesviral infection, unspecified: Secondary | ICD-10-CM | POA: Diagnosis not present

## 2024-02-16 MED ORDER — ESTRADIOL 0.0375 MG/24HR TD PTTW: 1.0000 | MEDICATED_PATCH | TRANSDERMAL | 2 refills | Status: DC

## 2024-02-16 MED ORDER — VALACYCLOVIR HCL 500 MG PO TABS: ORAL_TABLET | ORAL | 0 refills | Status: AC

## 2024-02-16 MED ORDER — PROGESTERONE MICRONIZED 100 MG PO CAPS: 100.0000 mg | ORAL_CAPSULE | Freq: Every day | ORAL | 11 refills | Status: DC

## 2024-02-16 NOTE — Progress Notes (Signed)
   Gabriela Shepard Oct 24, 1960 [id number]   History: Postmenopausal 63 y.o. G2P2 Referred from Urogynecology for HRT restart. Previous used oral estrogen and Mirena. Complains of hot flashes, trouble sleeping (trazodone  no help), chronic fatigue, mood swings, depression (medication no help), vaginal burning (using vaginal estrogen), brain fog, hx IC, currently in PFT.    Gynecologic History Postmenopausal Last Pap: 2019. Results were: normal Last mammogram: 2022. Results were: normal scheduled next month Last colonoscopy: 2022 DEXA: 2022 osteoporosis, did not tolerate fosamax   Obstetric History OB History  Gravida Para Term Preterm AB Living  2 2 2   2   SAB IAB Ectopic Multiple Live Births      2    # Outcome Date GA Lbr Len/2nd Weight Sex Type Anes PTL Lv  2 Term      Vag-Spont   LIV  1 Term      CS-Unspec   LIV       The following portions of the patient's history were reviewed and updated as appropriate: allergies, current medications, past family history, past medical history, past social history, past surgical history, and problem list.  Review of Systems Pertinent items noted in HPI and remainder of comprehensive ROS otherwise negative.  Past medical history, past surgical history, family history and social history were all reviewed and documented in the EPIC chart.  Exam:  Vitals:   02/16/24 1410  BP: 124/70  Pulse: 81  SpO2: 99%  Weight: 140 lb (63.5 kg)  Height: 5' 3.75 (1.619 m)   Body mass index is 24.22 kg/m.  General appearance:  Normal Genitourinary   Inguinal/mons:  Normal without inguinal adenopathy  External genitalia:  healing vesicular lesion left labia minora  BUS/Urethra/Skene's glands:  Normal  Vagina:  Normal appearing with normal color and discharge, no lesions. Atrophy: moderate   Cervix:  Normal appearing without discharge or lesions  Uterus:  Normal in size, shape and contour.  Midline and mobile, nontender  Adnexa/parametria:      Rt: Normal in size, without masses or tenderness.   Lt: Normal in size, without masses or tenderness.  Anus and perineum: Normal    Darice Hoit, CMA present for exam  Assessment/Plan:   1. Menopausal symptoms (Primary) Discussed risks and benefits, synthetic vs bioidentical and the safety profile.Continue vulvovaginal estrace  cream. - estradiol  (VIVELLE -DOT) 0.0375 MG/24HR; Place 1 patch onto the skin 2 (two) times a week.  Dispense: 8 patch; Refill: 2 - progesterone  (PROMETRIUM ) 100 MG capsule; Take 1 capsule (100 mg total) by mouth daily.  Dispense: 30 capsule; Refill: 11  2. HSV infection - valACYclovir  (VALTREX ) 500 MG tablet; TAKE 1 TABLET BY MOUTH TWICE A DAY FOR 3 DAYS AS NEEDED  Dispense: 90 tablet; Refill: 0   Schedule AEX and med check in 2 months  Trini Soldo B WHNP-BC, 2:35 PM 02/16/2024

## 2024-02-17 ENCOUNTER — Ambulatory Visit: Admitting: Obstetrics and Gynecology

## 2024-02-22 DIAGNOSIS — M542 Cervicalgia: Secondary | ICD-10-CM | POA: Diagnosis not present

## 2024-02-22 DIAGNOSIS — M9903 Segmental and somatic dysfunction of lumbar region: Secondary | ICD-10-CM | POA: Diagnosis not present

## 2024-02-22 DIAGNOSIS — M545 Low back pain, unspecified: Secondary | ICD-10-CM | POA: Diagnosis not present

## 2024-02-22 DIAGNOSIS — M9907 Segmental and somatic dysfunction of upper extremity: Secondary | ICD-10-CM | POA: Diagnosis not present

## 2024-02-22 DIAGNOSIS — M9902 Segmental and somatic dysfunction of thoracic region: Secondary | ICD-10-CM | POA: Diagnosis not present

## 2024-02-22 DIAGNOSIS — M9901 Segmental and somatic dysfunction of cervical region: Secondary | ICD-10-CM | POA: Diagnosis not present

## 2024-02-22 DIAGNOSIS — M546 Pain in thoracic spine: Secondary | ICD-10-CM | POA: Diagnosis not present

## 2024-02-22 DIAGNOSIS — G8929 Other chronic pain: Secondary | ICD-10-CM | POA: Diagnosis not present

## 2024-02-24 DIAGNOSIS — F439 Reaction to severe stress, unspecified: Secondary | ICD-10-CM | POA: Diagnosis not present

## 2024-02-27 ENCOUNTER — Other Ambulatory Visit: Payer: Self-pay

## 2024-02-27 ENCOUNTER — Ambulatory Visit: Attending: Family Medicine | Admitting: Physical Therapy

## 2024-02-27 ENCOUNTER — Encounter: Payer: Self-pay | Admitting: Physical Therapy

## 2024-02-27 DIAGNOSIS — M62838 Other muscle spasm: Secondary | ICD-10-CM | POA: Insufficient documentation

## 2024-02-27 DIAGNOSIS — R293 Abnormal posture: Secondary | ICD-10-CM | POA: Diagnosis not present

## 2024-02-27 DIAGNOSIS — R252 Cramp and spasm: Secondary | ICD-10-CM | POA: Diagnosis not present

## 2024-02-27 DIAGNOSIS — N8189 Other female genital prolapse: Secondary | ICD-10-CM | POA: Diagnosis not present

## 2024-02-27 DIAGNOSIS — R279 Unspecified lack of coordination: Secondary | ICD-10-CM | POA: Diagnosis not present

## 2024-02-27 DIAGNOSIS — M546 Pain in thoracic spine: Secondary | ICD-10-CM | POA: Diagnosis not present

## 2024-02-27 DIAGNOSIS — M6281 Muscle weakness (generalized): Secondary | ICD-10-CM | POA: Diagnosis not present

## 2024-02-27 NOTE — Therapy (Signed)
 OUTPATIENT PHYSICAL THERAPY FEMALE PELVIC EVALUATION   Patient Name: ROBIE OATS MRN: [id number] DOB:[birth year], 63 y.o., female Today's Date: 02/27/2024  END OF SESSION:  PT End of Session - 02/27/24 1404     Visit Number 1    Date for Recertification  08/26/24    Authorization Type [employer]    PT Start Time 1401    PT Stop Time 1441    PT Time Calculation (min) 40 min    Activity Tolerance Patient tolerated treatment well    Behavior During Therapy WFL for tasks assessed/performed          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   History of UTI    Interstitial cystitis    Low sodium levels    Migraines    Osteoporosis    PONV (postoperative nausea and vomiting)    PTSD (post-traumatic stress disorder)    on meds   STD (sexually transmitted disease)    Substance abuse (HCC)    hx of alcohol   Urine incontinence    Past Surgical History:  Procedure Laterality Date   BREAST BIOPSY Left    2017 benign x 2   CESAREAN SECTION  1992   COLONOSCOPY  2018   JMP-MAC-suprep(good)-polyps   INCONTINENCE SURGERY     LAPAROSCOPY     under bladder   NECK SURGERY  02/24/2018   ACDF   POLYPECTOMY  2018   polyps removed   TOTAL HIP ARTHROPLASTY Right 09/08/2020   TOTAL HIP ARTHROPLASTY Left 2018   TOTAL KNEE ARTHROPLASTY Left 12/23/2015   Procedure: TOTAL KNEE ARTHROPLASTY;  Surgeon: Maude Herald, MD;  Location: [facility] OR;  Service: Orthopedics;  Laterality: Left;   Patient Active Problem List   Diagnosis Date Noted   Abdominal pain, epigastric 08/04/2023   Encounter for long-term (current) use of NSAIDs 08/04/2023   Heartburn 08/04/2023   Iron  deficiency anemia 07/18/2023   B12 deficiency 06/14/2023   Left wrist pain 04/07/2023   Fibromyalgia 10/27/2022   Lumbar radiculopathy 09/25/2021   Low serum sodium 06/05/2021   MDD (major depressive disorder), recurrent severe, without psychosis (HCC) 06/03/2021   Impaired fasting glucose 06/03/2021   Osteoporosis 06/03/2021   Other specified abnormal immunological findings in serum 06/03/2021   Vitamin D  deficiency 06/03/2021   PTSD (post-traumatic stress disorder) 06/03/2021   Thyroid  nodule 03/24/2021   Lung nodule 03/03/2021   Easy bruising 03/03/2021   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   Globus sensation 12/23/2020   Left thyroid  nodule 12/23/2020   Polyarthralgia 10/28/2020   Interstitial cystitis 07/23/2020   Levator spasm 07/23/2020   Age-related osteoporosis without current pathological fracture 08/06/2019   Hyponatremia 08/06/2019   Multiple thyroid  nodules 08/06/2019   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   Piriformis syndrome, right 08/09/2018   Weight gain 02/08/2018   Trigger point of right shoulder region 12/22/2017   Cervical radiculopathy at C6 11/29/2017   Osteopenia 03/09/2016   Primary osteoarthritis of left knee 12/23/2015   IBS (irritable bowel syndrome) 02/19/2015   Postmenopausal 12/19/2013   Migraine headache 12/05/2012  Pain, upper back 09/24/2010   Depression, recurrent 09/04/2010    PCP: Micheal Wolm ORN, MD   REFERRING PROVIDER: Micheal Wolm ORN, MD   REFERRING DIAG: N81.89 (ICD-10-CM) - Pelvic floor weakness  THERAPY DIAG:  Muscle weakness (generalized)  Cramp and spasm  Abnormal posture  Unspecified lack of coordination  Other muscle spasm  Rationale for Evaluation and Treatment: Rehabilitation  ONSET DATE: 5 weeks ago  SUBJECTIVE:                                                                                                                                                                                            SUBJECTIVE STATEMENT: Increased urinary frequency at night but has since had been on hormones and decreased from 5x to 2x. Also reports pressure at bladder and notices relief with bearing down. Does report pain at bladder and vaginal area intermittently and this feels better with bearing down too. Has been getting massages and doing more stretching.  MD prescribed OAB medication but reports she had urinary hesitancy but does help with pressure/pain. Has started decreasing it now.   Fluid intake: total - 60 oz  but unsure   FUNCTIONAL LIMITATIONS: sleeping, sometimes needs to call out of work,  PERTINENT HISTORY:  Medications for current condition: yes see chart Surgeries: Bil THA, Lt TKA Other: Osteopenia, depression, anxiety, IC, Fibromyalgia Sexual abuse: No  DIAGNOSTIC FINDINGS:  Post-void residual: Voiding Cystourethrogram (VCUG):  Ultrasound: PAIN:  Are you having pain? Yes NPRS scale: 3/10 Pain location: bladder and vaginal  Pain type: aching and burning Pain description: intermittent   Aggravating factors: filling bladder Relieving factors: emptying bladder  PRECAUTIONS: None  RED FLAGS: None   WEIGHT BEARING RESTRICTIONS: No  FALLS:  Has patient fallen in last 6 months? No  OCCUPATION: yes  ACTIVITY LEVEL : low now decreased due to pain but was moderate  PLOF: Independent  PATIENT GOALS: to have better bladder habits, and fully relieve    BOWEL MOVEMENT: Pain with bowel movement: No Type of bowel movement:Type (Bristol Stool Scale) 1-2, Frequency every 3 days, and Strain to get started Fully empty rectum: No Leakage: No                                                   Pads: No Fiber supplement/laxative No  URINATION: Pain with urination: No Fully empty bladder: No  Post-void dribble: No Stream: Strong but sometimes hesitation to get started  Urgency: Yes every once in a while Frequency:during the day 3-4x                                                           Nocturia: Yes:  2x now   Leakage: none Pads/briefs: No  INTERCOURSE:  Ability to have vaginal penetration Yes  Pain with intercourse: none Dryness: yes internally and externally Climax: not painful Marinoff Scale: 0/3 Low libido   PREGNANCY: Vaginal deliveries 1 Tearing Yes:   Episiotomy No C-section deliveries 1 Currently pregnant No  PROLAPSE: Pressure   OBJECTIVE:  Note: Objective measures were completed at Evaluation unless otherwise noted.    COGNITION: Overall cognitive status: Within functional limits for tasks assessed     SENSATION: Light touch: Appears intact  FUNCTIONAL TESTS:   Single leg stance: mild pelvic rotation and sway  Rt:8s   Lt:8s Sit-up test:1/3   GAIT: WFL  POSTURE: rounded shoulders, forward head, and posterior pelvic tilt   LUMBARAROM/PROM:  A/PROM A/PROM  Eval (% available)  Flexion Limited by 25%  Extension WFL  Right lateral flexion Limited by 25%  Left lateral flexion Limited by 25%  Right rotation Limited by 25%  Left rotation Limited by 25%   (Blank rows = not tested)  LOWER EXTREMITY ROM:  Bil hamstrings, adductors, hip ER, hip IR Limited by 25%  LOWER EXTREMITY MMT:  Bil hips grossly 4/5 PALPATION:  General: tightness noted in bil thoracic and lumbar paraspinals  Pelvic Alignment: WFL  Abdominal: tension throughout all quadrants, TTP and greatest tension at lower mid quadrant  Breathing: chest breathing but with cues able to complete diaphragmatic breathing                  External Perineal Exam: dryness throughout vulva, no TTP, but lightened tissue coloring, mild labia adhesion, mild clitoral hood adhesion                              Internal Pelvic Floor: TTP bil obturators, anterior pelvic floor quadrant with several trigger points  Patient confirms identification and approves PT to assess internal pelvic floor and treatment Yes No  emotional/communication barriers or cognitive limitation. Patient is motivated to learn. Patient understands and agrees with treatment goals and plan. PT explains patient will be examined in standing, sitting, and lying down to see how their muscles and joints work. When they are ready, they will be asked to remove their underwear so PT can examine their perineum. The patient is also given the option of providing their own chaperone as one is not provided in our facility. The patient also has the right and is explained the right to defer or refuse any part of the evaluation or treatment including the internal exam. With the patient's consent, PT will use one gloved finger to gently assess the muscles of the pelvic floor, seeing how well it contracts and relaxes and if there is muscle symmetry. After, the patient will get dressed and PT and patient will discuss exam findings and plan of care. PT and patient discuss plan of care, schedule, attendance policy and HEP activities.  PELVIC MMT:   MMT eval  Vaginal 2-3/5; 4s; 4  reps  Internal Anal Sphincter   External Anal Sphincter   Puborectalis   Diastasis Recti   (Blank rows = not tested)        TONE: WFL  PROLAPSE: Not seen in hooklying with cough   TODAY'S TREATMENT:                                                                                                                              DATE:   02/27/24 EVAL Examination completed, findings reviewed, pt educated on POC, HEP, and manual after internal pelvic floor assessment with pt's consent for decreased tension at anterior pelvic floor quadrant with gentle STM, trigger point release and bimanual external release at mid lower abdominal quadrant to improve tissue relaxation with good effect. Pt also educated on feminine moisturizers. Pt motivated to participate in PT and agreeable to attempt recommendations.     PATIENT EDUCATION:  Education details: [id number] Person educated:  Patient Education method: Explanation, Demonstration, Tactile cues, Verbal cues, and Handouts Education comprehension: verbalized understanding, returned demonstration, verbal cues required, tactile cues required, and needs further education  HOME EXERCISE PROGRAM: [id number]  ASSESSMENT:  CLINICAL IMPRESSION: Patient is a 63 y.o. female  who was seen today for physical therapy evaluation and treatment for bladder and pelvic floor pain. Pt demonstrated impaired posture, decreased core and hip strength, decreased flexibility in spine and bil hips, TTP and fascial restrictions in abdomen. Patient consented to internal pelvic floor assessment vaginally this date and found to have decreased strength, endurance, and coordination but also had trigger points and TTP in bil obturators and anterior quadrant. Pt reported immediate release with manual here and at lower abdomen. And educated on HEP and wand use for home as pt already has pelvic wand and understands use from previous pelvic floor. Pt would benefit from additional PT to further address deficits.    OBJECTIVE IMPAIRMENTS: decreased activity tolerance, decreased coordination, decreased endurance, decreased mobility, decreased strength, increased fascial restrictions, impaired perceived functional ability, increased muscle spasms, impaired flexibility, improper body mechanics, postural dysfunction, and pain.   ACTIVITY LIMITATIONS: carrying, lifting, bending, sleeping, and locomotion level  PARTICIPATION LIMITATIONS: interpersonal relationship, community activity, and yard work  PERSONAL FACTORS: Time since onset of injury/illness/exacerbation and 1 comorbidity: medical history are also affecting patient's functional outcome.   REHAB POTENTIAL: Good  CLINICAL DECISION MAKING: Stable/uncomplicated  EVALUATION COMPLEXITY: Low   GOALS: Goals reviewed with patient? Yes  SHORT TERM GOALS: Target date: 03/26/24  Pt to be I with HEP for carry  over and continuing recommendations for improved outcomes.   Baseline: Goal status: INITIAL  2.  Pt will be independent with the knack, urge suppression technique, and double voiding in order to improve bladder habits and decrease urinary incontinence.   Baseline:  Goal status: INITIAL  3.  Pt to report no more than 2x night time urination voids on average with or without medication per MD recommendation.  Baseline:  Goal status: INITIAL  4.  Pt will be independent with use of squatty potty, relaxed toileting mechanics, and improved bowel movement techniques in order to increase ease of bowel movements and complete evacuation.   Baseline:  Goal status: INITIAL  LONG TERM GOALS: Target date: 08/26/24  Pt to be I with advanced HEP for carry over and continuing recommendations for improved outcomes.   Baseline:  Goal status: INITIAL  2.  Pt will report her bowel movements are complete at least 4 times a week due to improved bowel habits and evacuation techniques.  Baseline:  Goal status: INITIAL  3.  Pt will be able to correctly perform diaphragmatic breathing and appropriate pressure management in order to improve pelvic floor A/ROM.   Baseline:  Goal status: INITIAL  4.  Pt to report no more than 2/10 pain at abdomen/bladder for decreased tension at abdomen and pelvic floor to ease emptying bladder. Baseline:  Goal status: INITIAL  5.  Pt to report fully emptying bladder with voids no more than every 2 hours at least 75% of the time for improved bladder habits to complete errands without stopping to use restroom. Baseline:  Goal status: INITIAL  6.  Pt to report no pain with vaginal penetration in deep layer for improved tolerance to medical exams.  Baseline:  Goal status: INITIAL  PLAN:  PT FREQUENCY: 2x/week  PT DURATION: 16 sessions  PLANNED INTERVENTIONS: 97110-Therapeutic exercises, 97530- Therapeutic activity, [id number]- Neuromuscular re-education, 97535- Self Care,  97140- Manual therapy, [phone]- Canalith repositioning, [id number]- Aquatic Therapy, [phone]- Electrical stimulation (manual), 97016- Vasopneumatic device, [phone] (1-2 muscles), [id number] (3+ muscles)- Dry Needling, Patient/Family education, Taping, Joint mobilization, Spinal mobilization, Scar mobilization, DME instructions, Cryotherapy, Moist heat, and Biofeedback  PLAN FOR NEXT SESSION: breathing mechanics, voiding mechanics, abdominal massage, review of pelvic wand if needed, manual internally if needed and pt agrees.    Darryle Navy, PT, DPT 09/22/252:43 PM  [facility] [address], [address] [place], [place] [address] Phone # [phone] Fax [phone]

## 2024-02-28 DIAGNOSIS — F439 Reaction to severe stress, unspecified: Secondary | ICD-10-CM | POA: Diagnosis not present

## 2024-03-01 ENCOUNTER — Ambulatory Visit: Admitting: Physical Therapy

## 2024-03-01 ENCOUNTER — Encounter: Payer: Self-pay | Admitting: Physical Therapy

## 2024-03-01 DIAGNOSIS — M62838 Other muscle spasm: Secondary | ICD-10-CM | POA: Diagnosis not present

## 2024-03-01 DIAGNOSIS — R279 Unspecified lack of coordination: Secondary | ICD-10-CM | POA: Diagnosis not present

## 2024-03-01 DIAGNOSIS — R252 Cramp and spasm: Secondary | ICD-10-CM

## 2024-03-01 DIAGNOSIS — M6281 Muscle weakness (generalized): Secondary | ICD-10-CM | POA: Diagnosis not present

## 2024-03-01 DIAGNOSIS — R293 Abnormal posture: Secondary | ICD-10-CM | POA: Diagnosis not present

## 2024-03-01 DIAGNOSIS — M546 Pain in thoracic spine: Secondary | ICD-10-CM | POA: Diagnosis not present

## 2024-03-01 DIAGNOSIS — N8189 Other female genital prolapse: Secondary | ICD-10-CM | POA: Diagnosis not present

## 2024-03-01 NOTE — Therapy (Signed)
 OUTPATIENT PHYSICAL THERAPY FEMALE PELVIC TREATMENT   Patient Name: Gabriela Shepard MRN: [id number] DOB:10/21/1960, 63 y.o., female Today's Date: 03/01/2024  END OF SESSION:  PT End of Session - 03/01/24 1107     Visit Number 2    Date for Recertification  08/26/24    Authorization Type [employer]    PT Start Time 1107    PT Stop Time 1145    PT Time Calculation (min) 38 min    Activity Tolerance Patient tolerated treatment well    Behavior During Therapy WFL for tasks assessed/performed          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   History of UTI    Interstitial cystitis    Low sodium levels    Migraines    Osteoporosis    PONV (postoperative nausea and vomiting)    PTSD (post-traumatic stress disorder)    on meds   STD (sexually transmitted disease)    Substance abuse (HCC)    hx of alcohol   Urine incontinence    Past Surgical History:  Procedure Laterality Date   BREAST BIOPSY Left    2017 benign x 2   CESAREAN SECTION  1992   COLONOSCOPY  2018   JMP-MAC-suprep(good)-polyps   INCONTINENCE SURGERY     LAPAROSCOPY     under bladder   NECK SURGERY  02/24/2018   ACDF   POLYPECTOMY  2018   polyps removed   TOTAL HIP ARTHROPLASTY Right 09/08/2020   TOTAL HIP ARTHROPLASTY Left 2018   TOTAL KNEE ARTHROPLASTY Left 12/23/2015   Procedure: TOTAL KNEE ARTHROPLASTY;  Surgeon: Maude Herald, MD;  Location: [facility] OR;  Service: Orthopedics;  Laterality: Left;   Patient Active Problem List   Diagnosis Date Noted   Abdominal pain, epigastric 08/04/2023   Encounter for long-term (current) use of NSAIDs 08/04/2023   Heartburn 08/04/2023   Iron  deficiency anemia 07/18/2023   B12 deficiency 06/14/2023   Left wrist pain 04/07/2023   Fibromyalgia 10/27/2022   Lumbar radiculopathy 09/25/2021   Low serum sodium 06/05/2021   MDD (major depressive disorder), recurrent severe, without psychosis (HCC) 06/03/2021   Impaired fasting glucose 06/03/2021   Osteoporosis 06/03/2021   Other specified abnormal immunological findings in serum 06/03/2021   Vitamin D  deficiency 06/03/2021   PTSD (post-traumatic stress disorder) 06/03/2021   Thyroid  nodule 03/24/2021   Lung nodule 03/03/2021   Easy bruising 03/03/2021   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   Globus sensation 12/23/2020   Left thyroid  nodule 12/23/2020   Polyarthralgia 10/28/2020   Interstitial cystitis 07/23/2020   Levator spasm 07/23/2020   Age-related osteoporosis without current pathological fracture 08/06/2019   Hyponatremia 08/06/2019   Multiple thyroid  nodules 08/06/2019   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   Piriformis syndrome, right 08/09/2018   Weight gain 02/08/2018   Trigger point of right shoulder region 12/22/2017   Cervical radiculopathy at C6 11/29/2017   Osteopenia 03/09/2016   Primary osteoarthritis of left knee 12/23/2015   IBS (irritable bowel syndrome) 02/19/2015   Postmenopausal 12/19/2013   Migraine headache 12/05/2012  Pain, upper back 09/24/2010   Depression, recurrent 09/04/2010    PCP: Micheal Wolm ORN, MD   REFERRING PROVIDER: Micheal Wolm ORN, MD   REFERRING DIAG: N81.89 (ICD-10-CM) - Pelvic floor weakness  THERAPY DIAG:  Muscle weakness (generalized)  Abnormal posture  Other muscle spasm  Unspecified lack of coordination  Cramp and spasm  Pain in thoracic spine  Rationale for Evaluation and Treatment: Rehabilitation  ONSET DATE: 5 weeks ago  SUBJECTIVE:                                                                                                                                                                                            SUBJECTIVE STATEMENT: Patient reports that internal trigger point release really worked last visit She has had this for a while, stress contributes. Main symptoms are frequency, has been constipated as well Taking antidepressant and hormones. Has a hip replacement. Tried release with a wand but could not get it Tried with finger around urethra but could not do it as well as we can here     eval Increased urinary frequency at night but has since had been on hormones and decreased from 5x to 2x. Also reports pressure at bladder and notices relief with bearing down. Does report pain at bladder and vaginal area intermittently and this feels better with bearing down too. Has been getting massages and doing more stretching.  MD prescribed OAB medication but reports she had urinary hesitancy but does help with pressure/pain. Has started decreasing it now.   Fluid intake: total - 60 oz  but unsure   FUNCTIONAL LIMITATIONS: sleeping, sometimes needs to call out of work,  PERTINENT HISTORY:  Medications for current condition: yes see chart Surgeries: Bil THA, Lt TKA Other: Osteopenia, depression, anxiety, IC, Fibromyalgia Sexual abuse: No  DIAGNOSTIC FINDINGS:  Post-void residual: Voiding Cystourethrogram (VCUG):  Ultrasound: PAIN:  Are you having pain? Yes NPRS scale: 3/10 Pain location: bladder and vaginal  Pain type: aching and burning Pain description: intermittent   Aggravating factors: filling bladder Relieving factors: emptying bladder  PRECAUTIONS: None  RED FLAGS: None   WEIGHT BEARING RESTRICTIONS: No  FALLS:  Has patient fallen in last 6 months? No  OCCUPATION: yes  ACTIVITY LEVEL : low now decreased due to pain but was moderate  PLOF: Independent  PATIENT GOALS: to have better bladder habits, and fully relieve    BOWEL MOVEMENT: Pain with bowel movement: No Type of bowel movement:Type (Bristol Stool Scale) 1-2, Frequency every 3 days,  and Strain to get started Fully empty rectum: No Leakage: No  Pads: No Fiber supplement/laxative No  URINATION: Pain with urination: No Fully empty bladder: No                                 Post-void dribble: No Stream: Strong but sometimes hesitation to get started  Urgency: Yes every once in a while Frequency:during the day 3-4x                                                          Nocturia: Yes:  2x now   Leakage: none Pads/briefs: No  INTERCOURSE:  Ability to have vaginal penetration Yes  Pain with intercourse: none Dryness: yes internally and externally Climax: not painful Marinoff Scale: 0/3 Low libido   PREGNANCY: Vaginal deliveries 1 Tearing Yes:   Episiotomy No C-section deliveries 1 Currently pregnant No  PROLAPSE: Pressure   OBJECTIVE:  Note: Objective measures were completed at Evaluation unless otherwise noted.    COGNITION: Overall cognitive status: Within functional limits for tasks assessed     SENSATION: Light touch: Appears intact  FUNCTIONAL TESTS:   Single leg stance: mild pelvic rotation and sway  Rt:8s   Lt:8s Sit-up test:1/3   GAIT: WFL  POSTURE: rounded shoulders, forward head, and posterior pelvic tilt   LUMBARAROM/PROM:  A/PROM A/PROM  Eval (% available)  Flexion Limited by 25%  Extension WFL  Right lateral flexion Limited by 25%  Left lateral flexion Limited by 25%  Right rotation Limited by 25%  Left rotation Limited by 25%   (Blank rows = not tested)  LOWER EXTREMITY ROM:  Bil hamstrings, adductors, hip ER, hip IR Limited by 25%  LOWER EXTREMITY MMT:  Bil hips grossly 4/5 PALPATION:  General: tightness noted in bil thoracic and lumbar paraspinals  Pelvic Alignment: WFL  Abdominal: tension throughout all quadrants, TTP and greatest tension at lower mid quadrant  Breathing: chest breathing but with cues able to complete diaphragmatic breathing                   External Perineal Exam: dryness throughout vulva, no TTP, but lightened tissue coloring, mild labia adhesion, mild clitoral hood adhesion                              Internal Pelvic Floor: TTP bil obturators, anterior pelvic floor quadrant with several trigger points  Patient confirms identification and approves PT to assess internal pelvic floor and treatment Yes No emotional/communication barriers or cognitive limitation. Patient is motivated to learn. Patient understands and agrees with treatment goals and plan. PT explains patient will be examined in standing, sitting, and lying down to see how their muscles and joints work. When they are ready, they will be asked to remove their underwear so PT can examine their perineum. The patient is also given the option of providing their own chaperone as one is not provided in our facility. The patient also has the right and is explained the right to defer or refuse any part of the evaluation or treatment including the internal exam. With the patient's consent, PT will use one gloved finger to gently assess the muscles of the pelvic floor, seeing how well it contracts and relaxes and if there  is muscle symmetry. After, the patient will get dressed and PT and patient will discuss exam findings and plan of care. PT and patient discuss plan of care, schedule, attendance policy and HEP activities.  PELVIC MMT:   MMT eval  Vaginal 2-3/5; 4s; 4 reps  Internal Anal Sphincter   External Anal Sphincter   Puborectalis   Diastasis Recti   (Blank rows = not tested)        TONE: WFL  PROLAPSE: Not seen in hooklying with cough   TODAY'S TREATMENT:                                                                                                                              DATE:  9/25 Patient confirms identification and approves physical therapist to perform internal soft tissue work   Manual work vaginally and rectally  Diaphragmatic breathing  reed Review of progress      02/27/24 EVAL Examination completed, findings reviewed, pt educated on POC, HEP, and manual after internal pelvic floor assessment with pt's consent for decreased tension at anterior pelvic floor quadrant with gentle STM, trigger point release and bimanual external release at mid lower abdominal quadrant to improve tissue relaxation with good effect. Pt also educated on feminine moisturizers. Pt motivated to participate in PT and agreeable to attempt recommendations.     PATIENT EDUCATION:  Education details: [id number] Person educated: Patient Education method: Explanation, Demonstration, Tactile cues, Verbal cues, and Handouts Education comprehension: verbalized understanding, returned demonstration, verbal cues required, tactile cues required, and needs further education  HOME EXERCISE PROGRAM: [id number]  ASSESSMENT:  CLINICAL IMPRESSION: Patient is a 63 y.o. female  who was seen today for physical therapy evaluation and treatment for bladder and pelvic floor pain. She had pain relief with manual therapy vaginally. Difficulty with pelvic floor coordination rectally. Difficulty with lengthening rectally with stool in rectum, reports being constipated recently. Educated her on pelvic floor downtraining, diaphragmatic breathing for stress reduction.Pt would benefit from additional PT to further address deficits.    OBJECTIVE IMPAIRMENTS: decreased activity tolerance, decreased coordination, decreased endurance, decreased mobility, decreased strength, increased fascial restrictions, impaired perceived functional ability, increased muscle spasms, impaired flexibility, improper body mechanics, postural dysfunction, and pain.   ACTIVITY LIMITATIONS: carrying, lifting, bending, sleeping, and locomotion level  PARTICIPATION LIMITATIONS: interpersonal relationship, community activity, and yard work  PERSONAL FACTORS: Time since onset of injury/illness/exacerbation and 1  comorbidity: medical history are also affecting patient's functional outcome.   REHAB POTENTIAL: Good  CLINICAL DECISION MAKING: Stable/uncomplicated  EVALUATION COMPLEXITY: Low   GOALS: Goals reviewed with patient? Yes  SHORT TERM GOALS: Target date: 03/26/24  Pt to be I with HEP for carry over and continuing recommendations for improved outcomes.   Baseline: Goal status: INITIAL  2.  Pt will be independent with the knack, urge suppression technique, and double voiding in order to improve bladder habits and decrease urinary incontinence.   Baseline:  Goal status: INITIAL  3.  Pt to report no more than 2x night time urination voids on average with or without medication per MD recommendation.  Baseline:  Goal status: INITIAL  4.  Pt will be independent with use of squatty potty, relaxed toileting mechanics, and improved bowel movement techniques in order to increase ease of bowel movements and complete evacuation.   Baseline:  Goal status: INITIAL  LONG TERM GOALS: Target date: 08/26/24  Pt to be I with advanced HEP for carry over and continuing recommendations for improved outcomes.   Baseline:  Goal status: INITIAL  2.  Pt will report her bowel movements are complete at least 4 times a week due to improved bowel habits and evacuation techniques.  Baseline:  Goal status: INITIAL  3.  Pt will be able to correctly perform diaphragmatic breathing and appropriate pressure management in order to improve pelvic floor A/ROM.   Baseline:  Goal status: INITIAL  4.  Pt to report no more than 2/10 pain at abdomen/bladder for decreased tension at abdomen and pelvic floor to ease emptying bladder. Baseline:  Goal status: INITIAL  5.  Pt to report fully emptying bladder with voids no more than every 2 hours at least 75% of the time for improved bladder habits to complete errands without stopping to use restroom. Baseline:  Goal status: INITIAL  6.  Pt to report no pain with  vaginal penetration in deep layer for improved tolerance to medical exams.  Baseline:  Goal status: INITIAL  PLAN:  PT FREQUENCY: 2x/week  PT DURATION: 16 sessions  PLANNED INTERVENTIONS: 97110-Therapeutic exercises, 97530- Therapeutic activity, [id number]- Neuromuscular re-education, 97535- Self Care, [id number]- Manual therapy, [phone]- Canalith repositioning, [id number]- Aquatic Therapy, [phone]- Electrical stimulation (manual), [id number]- Vasopneumatic device, [phone] (1-2 muscles), [id number] (3+ muscles)- Dry Needling, Patient/Family education, Taping, Joint mobilization, Spinal mobilization, Scar mobilization, DME instructions, Cryotherapy, Moist heat, and Biofeedback  PLAN FOR NEXT SESSION: breathing mechanics, voiding mechanics, abdominal massage, review of pelvic wand if needed, manual internally if needed and pt agrees. reverse clams  Cori Florida, PT, DPT  [facility] [address], [address] [place], [place] [address] Phone # [phone] Fax [phone]

## 2024-03-05 DIAGNOSIS — M9902 Segmental and somatic dysfunction of thoracic region: Secondary | ICD-10-CM | POA: Diagnosis not present

## 2024-03-05 DIAGNOSIS — M546 Pain in thoracic spine: Secondary | ICD-10-CM | POA: Diagnosis not present

## 2024-03-05 DIAGNOSIS — M542 Cervicalgia: Secondary | ICD-10-CM | POA: Diagnosis not present

## 2024-03-05 DIAGNOSIS — M9906 Segmental and somatic dysfunction of lower extremity: Secondary | ICD-10-CM | POA: Diagnosis not present

## 2024-03-05 DIAGNOSIS — G8929 Other chronic pain: Secondary | ICD-10-CM | POA: Diagnosis not present

## 2024-03-05 DIAGNOSIS — M9907 Segmental and somatic dysfunction of upper extremity: Secondary | ICD-10-CM | POA: Diagnosis not present

## 2024-03-05 DIAGNOSIS — M25511 Pain in right shoulder: Secondary | ICD-10-CM | POA: Diagnosis not present

## 2024-03-05 DIAGNOSIS — M9903 Segmental and somatic dysfunction of lumbar region: Secondary | ICD-10-CM | POA: Diagnosis not present

## 2024-03-06 ENCOUNTER — Telehealth: Payer: Self-pay | Admitting: *Deleted

## 2024-03-06 ENCOUNTER — Ambulatory Visit: Admitting: Physical Therapy

## 2024-03-06 DIAGNOSIS — M62838 Other muscle spasm: Secondary | ICD-10-CM | POA: Diagnosis not present

## 2024-03-06 DIAGNOSIS — M6281 Muscle weakness (generalized): Secondary | ICD-10-CM

## 2024-03-06 DIAGNOSIS — R279 Unspecified lack of coordination: Secondary | ICD-10-CM | POA: Diagnosis not present

## 2024-03-06 DIAGNOSIS — N8189 Other female genital prolapse: Secondary | ICD-10-CM | POA: Diagnosis not present

## 2024-03-06 DIAGNOSIS — M546 Pain in thoracic spine: Secondary | ICD-10-CM | POA: Diagnosis not present

## 2024-03-06 DIAGNOSIS — R293 Abnormal posture: Secondary | ICD-10-CM

## 2024-03-06 DIAGNOSIS — R252 Cramp and spasm: Secondary | ICD-10-CM | POA: Diagnosis not present

## 2024-03-06 NOTE — Therapy (Signed)
 OUTPATIENT PHYSICAL THERAPY FEMALE PELVIC TREATMENT   Patient Name: Gabriela Shepard MRN: [id number] DOB:16-May-1961, 63 y.o., female Today's Date: 03/06/2024  END OF SESSION:  PT End of Session - 03/06/24 1456     Visit Number 3    Date for Recertification  08/26/24    Authorization Type [employer]    PT Start Time 1456   arrival   PT Stop Time 1536    PT Time Calculation (min) 40 min    Activity Tolerance Patient tolerated treatment well    Behavior During Therapy WFL for tasks assessed/performed          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   History of UTI    Interstitial cystitis    Low sodium levels    Migraines    Osteoporosis    PONV (postoperative nausea and vomiting)    PTSD (post-traumatic stress disorder)    on meds   STD (sexually transmitted disease)    Substance abuse (HCC)    hx of alcohol   Urine incontinence    Past Surgical History:  Procedure Laterality Date   BREAST BIOPSY Left    2017 benign x 2   CESAREAN SECTION  1992   COLONOSCOPY  2018   JMP-MAC-suprep(good)-polyps   INCONTINENCE SURGERY     LAPAROSCOPY     under bladder   NECK SURGERY  02/24/2018   ACDF   POLYPECTOMY  2018   polyps removed   TOTAL HIP ARTHROPLASTY Right 09/08/2020   TOTAL HIP ARTHROPLASTY Left 2018   TOTAL KNEE ARTHROPLASTY Left 12/23/2015   Procedure: TOTAL KNEE ARTHROPLASTY;  Surgeon: Maude Herald, MD;  Location: [facility] OR;  Service: Orthopedics;  Laterality: Left;   Patient Active Problem List   Diagnosis Date Noted   Abdominal pain, epigastric 08/04/2023   Encounter for long-term (current) use of NSAIDs 08/04/2023   Heartburn 08/04/2023    Iron deficiency anemia 07/18/2023   B12 deficiency 06/14/2023   Left wrist pain 04/07/2023   Fibromyalgia 10/27/2022   Lumbar radiculopathy 09/25/2021   Low serum sodium 06/05/2021   MDD (major depressive disorder), recurrent severe, without psychosis (HCC) 06/03/2021   Impaired fasting glucose 06/03/2021   Osteoporosis 06/03/2021   Other specified abnormal immunological findings in serum 06/03/2021   Vitamin D  deficiency 06/03/2021   PTSD (post-traumatic stress disorder) 06/03/2021   Thyroid  nodule 03/24/2021   Lung nodule 03/03/2021   Easy bruising 03/03/2021   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   Globus sensation 12/23/2020   Left thyroid  nodule 12/23/2020   Polyarthralgia 10/28/2020   Interstitial cystitis 07/23/2020   Levator spasm 07/23/2020   Age-related osteoporosis without current pathological fracture 08/06/2019   Hyponatremia 08/06/2019   Multiple thyroid  nodules 08/06/2019   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   Piriformis syndrome, right 08/09/2018   Weight gain 02/08/2018   Trigger point of right shoulder region 12/22/2017   Cervical radiculopathy at C6 11/29/2017   Osteopenia 03/09/2016   Primary osteoarthritis of left knee 12/23/2015   IBS (irritable bowel syndrome) 02/19/2015   Postmenopausal 12/19/2013   Migraine  headache 12/05/2012   Pain, upper back 09/24/2010   Depression, recurrent 09/04/2010    PCP: Micheal Wolm ORN, MD   REFERRING PROVIDER: Micheal Wolm ORN, MD   REFERRING DIAG: N81.89 (ICD-10-CM) - Pelvic floor weakness  THERAPY DIAG:  Muscle weakness (generalized)  Abnormal posture  Other muscle spasm  Rationale for Evaluation and Treatment: Rehabilitation  ONSET DATE: 5 weeks ago  SUBJECTIVE:                                                                                                                                                                                           SUBJECTIVE STATEMENT: Reports she is feeling a lot better  internally. Manual pelvic floor sessions have been helpful a lot. Has been trying to do manual on self at home but has a lot of difficult to do herself. Is working on [employer] and activity at home.    eval Increased urinary frequency at night but has since had been on hormones and decreased from 5x to 2x. Also reports pressure at bladder and notices relief with bearing down. Does report pain at bladder and vaginal area intermittently and this feels better with bearing down too. Has been getting massages and doing more stretching.  MD prescribed OAB medication but reports she had urinary hesitancy but does help with pressure/pain. Has started decreasing it now.   Fluid intake: total - 60 oz  but unsure   FUNCTIONAL LIMITATIONS: sleeping, sometimes needs to call out of work,  PERTINENT HISTORY:  Medications for current condition: yes see chart Surgeries: Bil THA, Lt TKA Other: Osteopenia, depression, anxiety, IC, Fibromyalgia Sexual abuse: No  DIAGNOSTIC FINDINGS:  Post-void residual: Voiding Cystourethrogram (VCUG):  Ultrasound: PAIN:  Are you having pain? Yes NPRS scale: 3/10 Pain location: bladder and vaginal  Pain type: aching and burning Pain description: intermittent   Aggravating factors: filling bladder Relieving factors: emptying bladder  PRECAUTIONS: None  RED FLAGS: None   WEIGHT BEARING RESTRICTIONS: No  FALLS:  Has patient fallen in last 6 months? No  OCCUPATION: yes  ACTIVITY LEVEL : low now decreased due to pain but was moderate  PLOF: Independent  PATIENT GOALS: to have better bladder habits, and fully relieve    BOWEL MOVEMENT: Pain with bowel movement: No Type of bowel movement:Type (Bristol Stool Scale) 1-2, Frequency every 3 days, and Strain to get started Fully empty rectum: No Leakage: No  Pads: No Fiber supplement/laxative No  URINATION: Pain with urination: No Fully empty bladder: No                                  Post-void dribble: No Stream: Strong but sometimes hesitation to get started  Urgency: Yes every once in a while Frequency:during the day 3-4x                                                          Nocturia: Yes:  2x now   Leakage: none Pads/briefs: No  INTERCOURSE:  Ability to have vaginal penetration Yes  Pain with intercourse: none Dryness: yes internally and externally Climax: not painful Marinoff Scale: 0/3 Low libido   PREGNANCY: Vaginal deliveries 1 Tearing Yes:   Episiotomy No C-section deliveries 1 Currently pregnant No  PROLAPSE: Pressure   OBJECTIVE:  Note: Objective measures were completed at Evaluation unless otherwise noted.    COGNITION: Overall cognitive status: Within functional limits for tasks assessed     SENSATION: Light touch: Appears intact  FUNCTIONAL TESTS:   Single leg stance: mild pelvic rotation and sway  Rt:8s   Lt:8s Sit-up test:1/3   GAIT: WFL  POSTURE: rounded shoulders, forward head, and posterior pelvic tilt   LUMBARAROM/PROM:  A/PROM A/PROM  Eval (% available)  Flexion Limited by 25%  Extension WFL  Right lateral flexion Limited by 25%  Left lateral flexion Limited by 25%  Right rotation Limited by 25%  Left rotation Limited by 25%   (Blank rows = not tested)  LOWER EXTREMITY ROM:  Bil hamstrings, adductors, hip ER, hip IR Limited by 25%  LOWER EXTREMITY MMT:  Bil hips grossly 4/5 PALPATION:  General: tightness noted in bil thoracic and lumbar paraspinals  Pelvic Alignment: WFL  Abdominal: tension throughout all quadrants, TTP and greatest tension at lower mid quadrant  Breathing: chest breathing but with cues able to complete diaphragmatic breathing                  External Perineal Exam: dryness throughout vulva, no TTP, but lightened tissue coloring, mild labia adhesion, mild clitoral hood adhesion                              Internal Pelvic Floor: TTP bil obturators,  anterior pelvic floor quadrant with several trigger points  Patient confirms identification and approves PT to assess internal pelvic floor and treatment Yes No emotional/communication barriers or cognitive limitation. Patient is motivated to learn. Patient understands and agrees with treatment goals and plan. PT explains patient will be examined in standing, sitting, and lying down to see how their muscles and joints work. When they are ready, they will be asked to remove their underwear so PT can examine their perineum. The patient is also given the option of providing their own chaperone as one is not provided in our facility. The patient also has the right and is explained the right to defer or refuse any part of the evaluation or treatment including the internal exam. With the patient's consent, PT will use one gloved finger to gently assess the muscles of the pelvic floor, seeing how well it contracts and relaxes and if  there is muscle symmetry. After, the patient will get dressed and PT and patient will discuss exam findings and plan of care. PT and patient discuss plan of care, schedule, attendance policy and HEP activities.  PELVIC MMT:   MMT eval  Vaginal 2-3/5; 4s; 4 reps  Internal Anal Sphincter   External Anal Sphincter   Puborectalis   Diastasis Recti   (Blank rows = not tested)        TONE: WFL  PROLAPSE: Not seen in hooklying with cough   TODAY'S TREATMENT:                                                                                                                              DATE:   03/06/24 Patient consented to internal pelvic floor treatment vaginally this date and found to have continued tension at anterior pelvic floor quadrant with gentle STM, trigger point release and bimanual external release at mid lower abdominal quadrant to improve tissue relaxation with good effect. Once released, gloves changes, abdominal fascial release completed with direct technique in mid  and lower quadrants. Pt also reports last session had rectal manual treatment to pelvic floor and had discomfort and requested to attempt this again today as she wasn't having pain and wanted to try again. This was completed post hand hygiene and gloves changed. Pt reports she had greatly less pain today, did have one trigger point at superficial lt side but released well.   9/25 Patient confirms identification and approves physical therapist to perform internal soft tissue work   Manual work vaginally and rectally  Diaphragmatic breathing reed Review of progress      02/27/24 EVAL Examination completed, findings reviewed, pt educated on POC, HEP, and manual after internal pelvic floor assessment with pt's consent for decreased tension at anterior pelvic floor quadrant with gentle STM, trigger point release and bimanual external release at mid lower abdominal quadrant to improve tissue relaxation with good effect. Pt also educated on feminine moisturizers. Pt motivated to participate in PT and agreeable to attempt recommendations.     PATIENT EDUCATION:  Education details: [id number] Person educated: Patient Education method: Explanation, Demonstration, Tactile cues, Verbal cues, and Handouts Education comprehension: verbalized understanding, returned demonstration, verbal cues required, tactile cues required, and needs further education  HOME EXERCISE PROGRAM: [id number]  ASSESSMENT:  CLINICAL IMPRESSION: Patient is a 63 y.o. female  who was seen today for physical therapy evaluation and treatment for bladder and pelvic floor pain. Pt tolerated treatment well, had no pain at end of session. Does continue to benefit from cues for relaxation and manual work externally and internally for improved tissue mobility and decreased pain. Pt very complaint and attempts recommendations at home but difficult to achieve deeper internal release work with wand vaginally per pt. Greatly benefits from internal  work in clinic from skilled PT. Pt would benefit from additional PT to further address deficits.    OBJECTIVE IMPAIRMENTS: decreased activity tolerance, decreased  coordination, decreased endurance, decreased mobility, decreased strength, increased fascial restrictions, impaired perceived functional ability, increased muscle spasms, impaired flexibility, improper body mechanics, postural dysfunction, and pain.   ACTIVITY LIMITATIONS: carrying, lifting, bending, sleeping, and locomotion level  PARTICIPATION LIMITATIONS: interpersonal relationship, community activity, and yard work  PERSONAL FACTORS: Time since onset of injury/illness/exacerbation and 1 comorbidity: medical history are also affecting patient's functional outcome.   REHAB POTENTIAL: Good  CLINICAL DECISION MAKING: Stable/uncomplicated  EVALUATION COMPLEXITY: Low   GOALS: Goals reviewed with patient? Yes  SHORT TERM GOALS: Target date: 03/26/24  Pt to be I with HEP for carry over and continuing recommendations for improved outcomes.   Baseline: Goal status: INITIAL  2.  Pt will be independent with the knack, urge suppression technique, and double voiding in order to improve bladder habits and decrease urinary incontinence.   Baseline:  Goal status: INITIAL  3.  Pt to report no more than 2x night time urination voids on average with or without medication per MD recommendation.  Baseline:  Goal status: INITIAL  4.  Pt will be independent with use of squatty potty, relaxed toileting mechanics, and improved bowel movement techniques in order to increase ease of bowel movements and complete evacuation.   Baseline:  Goal status: INITIAL  LONG TERM GOALS: Target date: 08/26/24  Pt to be I with advanced HEP for carry over and continuing recommendations for improved outcomes.   Baseline:  Goal status: INITIAL  2.  Pt will report her bowel movements are complete at least 4 times a week due to improved bowel habits and  evacuation techniques.  Baseline:  Goal status: INITIAL  3.  Pt will be able to correctly perform diaphragmatic breathing and appropriate pressure management in order to improve pelvic floor A/ROM.   Baseline:  Goal status: INITIAL  4.  Pt to report no more than 2/10 pain at abdomen/bladder for decreased tension at abdomen and pelvic floor to ease emptying bladder. Baseline:  Goal status: INITIAL  5.  Pt to report fully emptying bladder with voids no more than every 2 hours at least 75% of the time for improved bladder habits to complete errands without stopping to use restroom. Baseline:  Goal status: INITIAL  6.  Pt to report no pain with vaginal penetration in deep layer for improved tolerance to medical exams.  Baseline:  Goal status: INITIAL  PLAN:  PT FREQUENCY: 2x/week  PT DURATION: 16 sessions  PLANNED INTERVENTIONS: 97110-Therapeutic exercises, 97530- Therapeutic activity, [id number]- Neuromuscular re-education, 97535- Self Care, [id number]- Manual therapy, [phone]- Canalith repositioning, [id number]- Aquatic Therapy, [phone]- Electrical stimulation (manual), [id number]- Vasopneumatic device, [phone] (1-2 muscles), 20561 (3+ muscles)- Dry Needling, Patient/Family education, Taping, Joint mobilization, Spinal mobilization, Scar mobilization, DME instructions, Cryotherapy, Moist heat, and Biofeedback  PLAN FOR NEXT SESSION: breathing mechanics, voiding mechanics, abdominal massage, review of pelvic wand if needed, manual internally if needed and pt agrees. reverse clams  Darryle Navy, PT, DPT 09/30/253:39 PM  [facility] [address], [address] [place], [place] [address] Phone # [phone] Fax [phone]

## 2024-03-06 NOTE — Telephone Encounter (Signed)
 Call to patient. Patient states original appointment on 04/25/24 for AEX will not work for her. Patient requesting the week prior. RN advised no AEX appointments available. Patient states she would just like to have 2 appointments. OV for 2 month med check on 04/17/24 at 1400. Patient agreeable to date and time of appointment. AEX rescheduled for 04/30/24 at 1600. Patient agreeable to date and time of appointment.   Encounter closed.

## 2024-03-07 ENCOUNTER — Other Ambulatory Visit: Payer: Self-pay | Admitting: Radiology

## 2024-03-07 DIAGNOSIS — N951 Menopausal and female climacteric states: Secondary | ICD-10-CM

## 2024-03-07 NOTE — Telephone Encounter (Signed)
.  Med refill request: estradiol  patch  Last OV: 02/16/24 Next OV: 04/17/24 Last MMG (if hormonal med) 01/05/21  Refill authorized: Please Advise?   Pt is requesting a 90 day supply  Please advise

## 2024-03-10 ENCOUNTER — Other Ambulatory Visit: Payer: Self-pay | Admitting: Family Medicine

## 2024-03-13 ENCOUNTER — Ambulatory Visit: Payer: Self-pay | Attending: Family Medicine | Admitting: Physical Therapy

## 2024-03-13 ENCOUNTER — Ambulatory Visit: Admitting: Family Medicine

## 2024-03-13 VITALS — BP 126/70 | HR 83 | Temp 97.7°F | Wt 142.4 lb

## 2024-03-13 DIAGNOSIS — M6281 Muscle weakness (generalized): Secondary | ICD-10-CM | POA: Diagnosis not present

## 2024-03-13 DIAGNOSIS — R21 Rash and other nonspecific skin eruption: Secondary | ICD-10-CM | POA: Diagnosis not present

## 2024-03-13 DIAGNOSIS — M62838 Other muscle spasm: Secondary | ICD-10-CM | POA: Diagnosis not present

## 2024-03-13 DIAGNOSIS — R279 Unspecified lack of coordination: Secondary | ICD-10-CM | POA: Diagnosis not present

## 2024-03-13 DIAGNOSIS — R293 Abnormal posture: Secondary | ICD-10-CM | POA: Insufficient documentation

## 2024-03-13 MED ORDER — DOXYCYCLINE HYCLATE 100 MG PO CAPS
100.0000 mg | ORAL_CAPSULE | Freq: Two times a day (BID) | ORAL | 0 refills | Status: DC
Start: 2024-03-13 — End: 2024-03-20

## 2024-03-13 MED ORDER — DULOXETINE HCL 60 MG PO CPEP: 60.0000 mg | ORAL_CAPSULE | Freq: Every day | ORAL | 3 refills | Status: AC

## 2024-03-13 NOTE — Progress Notes (Signed)
 Established Patient Office Visit  Subjective   Patient ID: Gabriela Shepard, female    DOB: 06-23-1960  Age: 63 y.o. MRN: [id number]  Chief Complaint  Patient presents with   Rash    HPI   Recent seen with facial rash.  Started about 3 weeks ago.  Burning and itching quality.  She did recently go on estrogen patch and that seemed to possibly been a trigger.  She did have some leftover steroid cream but cannot recall which type which did not seem to help.  She does not have any history of rosacea but states her mom did.  She was recently started on Cymbalta  and does feel like this has helped her mood at 60 mg.  Unfortunately, has not helped with any of her chronic pain issues.  Overall though she is pleased with results of Cymbalta  thus far.  She recently did some pelvic floor exercises which have helped with her urinary symptoms.  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   History of UTI    Interstitial cystitis    Low sodium levels    Migraines    Osteoporosis    PONV (postoperative nausea and vomiting)    PTSD (post-traumatic stress disorder)    on meds   STD (sexually transmitted disease)    Substance abuse (HCC)    hx of alcohol   Urine incontinence    Past Surgical History:  Procedure Laterality Date   BREAST BIOPSY Left    2017 benign x 2   CESAREAN SECTION  1992   COLONOSCOPY  2018   JMP-MAC-suprep(good)-polyps   INCONTINENCE SURGERY     LAPAROSCOPY     under bladder   NECK SURGERY  02/24/2018   ACDF   POLYPECTOMY  2018   polyps removed   TOTAL HIP ARTHROPLASTY Right 09/08/2020   TOTAL HIP ARTHROPLASTY Left 2018   TOTAL KNEE  ARTHROPLASTY Left 12/23/2015   Procedure: TOTAL KNEE ARTHROPLASTY;  Surgeon: Maude Herald, MD;  Location: [facility] OR;  Service: Orthopedics;  Laterality: Left;    reports that she quit smoking about 13 years ago. Her smoking use included cigarettes. She started smoking about 23 years ago. She has a 5 pack-year smoking history. She has never used smokeless tobacco. She reports that she does not drink alcohol and does not use drugs. family history includes Alcohol abuse in her brother and another family member; Alcoholism in her brother; Arthritis in an other family member; Cancer (age of onset: 65) in her father; Drug abuse in her sister; Healthy in her daughter and son; Heart disease in her father; Hypertension in her maternal grandmother, mother, and another family member; Kidney cancer in her father; Lung cancer in her mother; Mental illness in an other family member; Ulcers in her father. Allergies  Allergen Reactions   Alendronate  Other (See Comments)    Extreme gas and heartburn, makes me sick   Sulfa Antibiotics Hives    Review of Systems  Constitutional:  Negative for chills and fever.  Skin:  Positive for rash.  Objective:     BP 126/70   Pulse 83   Temp 97.7 F (36.5 C) (Oral)   Wt 142 lb 6.4 oz (64.6 kg)   LMP 06/07/2010 (Approximate)   SpO2 97%   BMI 24.63 kg/m  BP Readings from Last 3 Encounters:  03/13/24 126/70  02/16/24 124/70  02/07/24 130/80   Wt Readings from Last 3 Encounters:  03/13/24 142 lb 6.4 oz (64.6 kg)  02/16/24 140 lb (63.5 kg)  02/07/24 139 lb 4.8 oz (63.2 kg)      Physical Exam Vitals reviewed.  Constitutional:      General: She is not in acute distress.    Appearance: She is not ill-appearing.  Cardiovascular:     Rate and Rhythm: Normal rate and regular rhythm.  Skin:    Findings: Rash present.     Comments: She has erythematous facial rash around the left nasolabial area and extending down to the left side of the mouth and left  chin.  She does have a couple very small pustules.  Mostly nonscaly.  She has some prominent telangiectasias.  Neurological:     Mental Status: She is alert.      No results found for any visits on 03/13/24.    The 10-year ASCVD risk score (Arnett DK, et al., 2019) is: 3.8%    Assessment & Plan:   Facial rash.  Seem to worsen after starting estrogen replacement through her GYN.  Differential is perioral dermatitis/rosacea  -Avoid occlusive cosmetics or creams. - Recommend trial of doxycycline  100 mg twice daily.  Caution about sun sensitivity - Consider possible trial of topical metronidazole , especially if not clearing with the above - Dermatology referral placed  [name], MD

## 2024-03-13 NOTE — Therapy (Signed)
 OUTPATIENT PHYSICAL THERAPY FEMALE PELVIC TREATMENT   Patient Name: Gabriela Shepard MRN: [id number] DOB:[birth year], 63 y.o., female Today's Date: 03/13/2024  END OF SESSION:  PT End of Session - 03/13/24 1241     Visit Number 4    Date for Recertification  08/26/24    Authorization Type [employer]    PT Start Time 1102    PT Stop Time 1145    PT Time Calculation (min) 43 min    Activity Tolerance Patient tolerated treatment well    Behavior During Therapy WFL for tasks assessed/performed           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   History of UTI    Interstitial cystitis    Low sodium levels    Migraines    Osteoporosis    PONV (postoperative nausea and vomiting)    PTSD (post-traumatic stress disorder)    on meds   STD (sexually transmitted disease)    Substance abuse (HCC)    hx of alcohol   Urine incontinence    Past Surgical History:  Procedure Laterality Date   BREAST BIOPSY Left    2017 benign x 2   CESAREAN SECTION  1992   COLONOSCOPY  2018   JMP-MAC-suprep(good)-polyps   INCONTINENCE SURGERY     LAPAROSCOPY     under bladder   NECK SURGERY  02/24/2018   ACDF   POLYPECTOMY  2018   polyps removed   TOTAL HIP ARTHROPLASTY Right 09/08/2020   TOTAL HIP ARTHROPLASTY Left 2018   TOTAL KNEE ARTHROPLASTY Left 12/23/2015   Procedure: TOTAL KNEE ARTHROPLASTY;  Surgeon: Maude Herald, MD;  Location: [facility] OR;  Service: Orthopedics;  Laterality: Left;   Patient Active Problem List   Diagnosis Date Noted   Abdominal pain, epigastric 08/04/2023   Encounter for long-term (current) use of NSAIDs 08/04/2023   Heartburn 08/04/2023   Iron  deficiency anemia 07/18/2023   B12 deficiency 06/14/2023   Left wrist pain 04/07/2023   Fibromyalgia 10/27/2022   Lumbar radiculopathy 09/25/2021   Low serum sodium 06/05/2021   MDD (major depressive disorder), recurrent severe, without psychosis (HCC) 06/03/2021   Impaired fasting glucose 06/03/2021   Osteoporosis 06/03/2021   Other specified abnormal immunological findings in serum 06/03/2021   Vitamin D  deficiency 06/03/2021   PTSD (post-traumatic stress disorder) 06/03/2021   Thyroid  nodule 03/24/2021   Lung nodule 03/03/2021   Easy bruising 03/03/2021   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   Globus sensation 12/23/2020   Left thyroid  nodule 12/23/2020   Polyarthralgia 10/28/2020   Interstitial cystitis 07/23/2020   Levator spasm 07/23/2020   Age-related osteoporosis without current pathological fracture 08/06/2019   Hyponatremia 08/06/2019   Multiple thyroid  nodules 08/06/2019   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   Piriformis syndrome, right 08/09/2018   Weight gain 02/08/2018   Trigger point of right shoulder region 12/22/2017   Cervical radiculopathy at C6 11/29/2017   Osteopenia 03/09/2016   Primary osteoarthritis of left knee 12/23/2015   IBS (irritable bowel syndrome) 02/19/2015   Postmenopausal 12/19/2013   Migraine headache  12/05/2012   Pain, upper back 09/24/2010   Depression, recurrent 09/04/2010    PCP: Micheal Wolm ORN, MD   REFERRING PROVIDER: Micheal Wolm ORN, MD   REFERRING DIAG: N81.89 (ICD-10-CM) - Pelvic floor weakness  THERAPY DIAG:  Muscle weakness (generalized)  Abnormal posture  Other muscle spasm  Unspecified lack of coordination  Rationale for Evaluation and Treatment: Rehabilitation  ONSET DATE: 5 weeks ago  SUBJECTIVE:                                                                                                                                                                                           SUBJECTIVE  STATEMENT: Reports she is feeling a lot better internally. Manual pelvic floor sessions have been helpful a lot. Has been trying to do manual on self at home but has a lot of difficult to do herself. Is working on [employer] and activity at home.    eval Increased urinary frequency at night but has since had been on hormones and decreased from 5x to 2x. Also reports pressure at bladder and notices relief with bearing down. Does report pain at bladder and vaginal area intermittently and this feels better with bearing down too. Has been getting massages and doing more stretching.  MD prescribed OAB medication but reports she had urinary hesitancy but does help with pressure/pain. Has started decreasing it now.   Fluid intake: total - 60 oz  but unsure   FUNCTIONAL LIMITATIONS: sleeping, sometimes needs to call out of work,  PERTINENT HISTORY:  Medications for current condition: yes see chart Surgeries: Bil THA, Lt TKA Other: Osteopenia, depression, anxiety, IC, Fibromyalgia Sexual abuse: No  DIAGNOSTIC FINDINGS:  Post-void residual: Voiding Cystourethrogram (VCUG):  Ultrasound: PAIN:  Are you having pain? Yes NPRS scale: 3/10 Pain location: bladder and vaginal  Pain type: aching and burning Pain description: intermittent   Aggravating factors: filling bladder Relieving factors: emptying bladder  PRECAUTIONS: None  RED FLAGS: None   WEIGHT BEARING RESTRICTIONS: No  FALLS:  Has patient fallen in last 6 months? No  OCCUPATION: yes  ACTIVITY LEVEL : low now decreased due to pain but was moderate  PLOF: Independent  PATIENT GOALS: to have better bladder habits, and fully relieve    BOWEL MOVEMENT: Pain with bowel movement: No Type of bowel movement:Type (Bristol Stool Scale) 1-2, Frequency every 3 days, and Strain to get started Fully empty rectum: No Leakage: No  Pads: No Fiber supplement/laxative No  URINATION: Pain with  urination: No Fully empty bladder: No                                 Post-void dribble: No Stream: Strong but sometimes hesitation to get started  Urgency: Yes every once in a while Frequency:during the day 3-4x                                                          Nocturia: Yes:  2x now   Leakage: none Pads/briefs: No  INTERCOURSE:  Ability to have vaginal penetration Yes  Pain with intercourse: none Dryness: yes internally and externally Climax: not painful Marinoff Scale: 0/3 Low libido   PREGNANCY: Vaginal deliveries 1 Tearing Yes:   Episiotomy No C-section deliveries 1 Currently pregnant No  PROLAPSE: Pressure   OBJECTIVE:  Note: Objective measures were completed at Evaluation unless otherwise noted.    COGNITION: Overall cognitive status: Within functional limits for tasks assessed     SENSATION: Light touch: Appears intact  FUNCTIONAL TESTS:   Single leg stance: mild pelvic rotation and sway  Rt:8s   Lt:8s Sit-up test:1/3   GAIT: WFL  POSTURE: rounded shoulders, forward head, and posterior pelvic tilt   LUMBARAROM/PROM:  A/PROM A/PROM  Eval (% available)  Flexion Limited by 25%  Extension WFL  Right lateral flexion Limited by 25%  Left lateral flexion Limited by 25%  Right rotation Limited by 25%  Left rotation Limited by 25%   (Blank rows = not tested)  LOWER EXTREMITY ROM:  Bil hamstrings, adductors, hip ER, hip IR Limited by 25%  LOWER EXTREMITY MMT:  Bil hips grossly 4/5 PALPATION:  General: tightness noted in bil thoracic and lumbar paraspinals  Pelvic Alignment: WFL  Abdominal: tension throughout all quadrants, TTP and greatest tension at lower mid quadrant  Breathing: chest breathing but with cues able to complete diaphragmatic breathing                  External Perineal Exam: dryness throughout vulva, no TTP, but lightened tissue coloring, mild labia adhesion, mild clitoral hood adhesion                               Internal Pelvic Floor: TTP bil obturators, anterior pelvic floor quadrant with several trigger points  Patient confirms identification and approves PT to assess internal pelvic floor and treatment Yes No emotional/communication barriers or cognitive limitation. Patient is motivated to learn. Patient understands and agrees with treatment goals and plan. PT explains patient will be examined in standing, sitting, and lying down to see how their muscles and joints work. When they are ready, they will be asked to remove their underwear so PT can examine their perineum. The patient is also given the option of providing their own chaperone as one is not provided in our facility. The patient also has the right and is explained the right to defer or refuse any part of the evaluation or treatment including the internal exam. With the patient's consent, PT will use one gloved finger to gently assess the muscles of the pelvic floor, seeing how well it contracts and relaxes and if  there is muscle symmetry. After, the patient will get dressed and PT and patient will discuss exam findings and plan of care. PT and patient discuss plan of care, schedule, attendance policy and HEP activities.  PELVIC MMT:   MMT eval  Vaginal 2-3/5; 4s; 4 reps  Internal Anal Sphincter   External Anal Sphincter   Puborectalis   Diastasis Recti   (Blank rows = not tested)        TONE: WFL  PROLAPSE: Not seen in hooklying with cough   TODAY'S TREATMENT:                                                                                                                              DATE:   03/13/24: Patient consented to internal pelvic floor treatment vaginally this date and found to have continued tension at anterior pelvic floor quadrant with gentle STM, trigger point release and bimanual external release at mid lower abdominal quadrant to improve tissue relaxation with good effect. STM and release at bil obturators with pt  directed to have dynamic mobility of marching on same side with manual and hip IR/ER with manual at obturator completed bil and pt noted great relief with this.  External manual at clitoral hood and bil ischiocavernosus for improved mobility and pt reports she felt this at inside under bladder.  Gloves changed and external abdominal fascia release work throughout lower and mid quadrants   03/06/24 Patient consented to internal pelvic floor treatment vaginally this date and found to have continued tension at anterior pelvic floor quadrant with gentle STM, trigger point release and bimanual external release at mid lower abdominal quadrant to improve tissue relaxation with good effect. Once released, gloves changes, abdominal fascial release completed with direct technique in mid and lower quadrants. Pt also reports last session had rectal manual treatment to pelvic floor and had discomfort and requested to attempt this again today as she wasn't having pain and wanted to try again. This was completed post hand hygiene and gloves changed. Pt reports she had greatly less pain today, did have one trigger point at superficial lt side but released well.    PATIENT EDUCATION:  Education details: [id number] Person educated: Patient Education method: Explanation, Demonstration, Tactile cues, Verbal cues, and Handouts Education comprehension: verbalized understanding, returned demonstration, verbal cues required, tactile cues required, and needs further education  HOME EXERCISE PROGRAM: [id number]  ASSESSMENT:  CLINICAL IMPRESSION: Patient is a 63 y.o. female  who was seen today for physical therapy evaluation and treatment for bladder and pelvic floor pain. Pt tolerated treatment well, had no pain at end of session. Does continue to benefit from cues for relaxation and manual work externally and internally for improved tissue mobility and decreased pain. Pt very complaint and attempts recommendations at home  but difficult to achieve deeper internal release work with wand vaginally per pt. Greatly benefits from internal work in clinic from skilled PT. Pt would benefit from additional  PT to further address deficits.    OBJECTIVE IMPAIRMENTS: decreased activity tolerance, decreased coordination, decreased endurance, decreased mobility, decreased strength, increased fascial restrictions, impaired perceived functional ability, increased muscle spasms, impaired flexibility, improper body mechanics, postural dysfunction, and pain.   ACTIVITY LIMITATIONS: carrying, lifting, bending, sleeping, and locomotion level  PARTICIPATION LIMITATIONS: interpersonal relationship, community activity, and yard work  PERSONAL FACTORS: Time since onset of injury/illness/exacerbation and 1 comorbidity: medical history are also affecting patient's functional outcome.   REHAB POTENTIAL: Good  CLINICAL DECISION MAKING: Stable/uncomplicated  EVALUATION COMPLEXITY: Low   GOALS: Goals reviewed with patient? Yes  SHORT TERM GOALS: Target date: 03/26/24  Pt to be I with HEP for carry over and continuing recommendations for improved outcomes.   Baseline: Goal status: INITIAL  2.  Pt will be independent with the knack, urge suppression technique, and double voiding in order to improve bladder habits and decrease urinary incontinence.   Baseline:  Goal status: INITIAL  3.  Pt to report no more than 2x night time urination voids on average with or without medication per MD recommendation.  Baseline:  Goal status: INITIAL  4.  Pt will be independent with use of squatty potty, relaxed toileting mechanics, and improved bowel movement techniques in order to increase ease of bowel movements and complete evacuation.   Baseline:  Goal status: INITIAL  LONG TERM GOALS: Target date: 08/26/24  Pt to be I with advanced HEP for carry over and continuing recommendations for improved outcomes.   Baseline:  Goal status:  INITIAL  2.  Pt will report her bowel movements are complete at least 4 times a week due to improved bowel habits and evacuation techniques.  Baseline:  Goal status: INITIAL  3.  Pt will be able to correctly perform diaphragmatic breathing and appropriate pressure management in order to improve pelvic floor A/ROM.   Baseline:  Goal status: INITIAL  4.  Pt to report no more than 2/10 pain at abdomen/bladder for decreased tension at abdomen and pelvic floor to ease emptying bladder. Baseline:  Goal status: INITIAL  5.  Pt to report fully emptying bladder with voids no more than every 2 hours at least 75% of the time for improved bladder habits to complete errands without stopping to use restroom. Baseline:  Goal status: INITIAL  6.  Pt to report no pain with vaginal penetration in deep layer for improved tolerance to medical exams.  Baseline:  Goal status: INITIAL  PLAN:  PT FREQUENCY: 2x/week  PT DURATION: 16 sessions  PLANNED INTERVENTIONS: 97110-Therapeutic exercises, 97530- Therapeutic activity, [id number]- Neuromuscular re-education, 97535- Self Care, [id number]- Manual therapy, [phone]- Canalith repositioning, [id number]- Aquatic Therapy, [phone]- Electrical stimulation (manual), 97016- Vasopneumatic device, [phone] (1-2 muscles), 20561 (3+ muscles)- Dry Needling, Patient/Family education, Taping, Joint mobilization, Spinal mobilization, Scar mobilization, DME instructions, Cryotherapy, Moist heat, and Biofeedback  PLAN FOR NEXT SESSION: breathing mechanics, voiding mechanics, abdominal massage, review of pelvic wand if needed, manual internally if needed and pt agrees. reverse clams  Darryle Navy, PT, DPT 03/14/2511:42 PM  [facility] [address], [address] [place], [place] [address] Phone # [phone] Fax [phone]

## 2024-03-18 ENCOUNTER — Other Ambulatory Visit: Payer: Self-pay | Admitting: Family Medicine

## 2024-03-19 ENCOUNTER — Encounter: Payer: Self-pay | Admitting: Family Medicine

## 2024-03-19 ENCOUNTER — Other Ambulatory Visit (HOSPITAL_BASED_OUTPATIENT_CLINIC_OR_DEPARTMENT_OTHER): Payer: Self-pay | Admitting: Family Medicine

## 2024-03-19 DIAGNOSIS — Z1231 Encounter for screening mammogram for malignant neoplasm of breast: Secondary | ICD-10-CM

## 2024-03-20 ENCOUNTER — Inpatient Hospital Stay (HOSPITAL_BASED_OUTPATIENT_CLINIC_OR_DEPARTMENT_OTHER): Admission: RE | Admit: 2024-03-20 | Source: Ambulatory Visit | Admitting: Radiology

## 2024-03-20 DIAGNOSIS — Z1231 Encounter for screening mammogram for malignant neoplasm of breast: Secondary | ICD-10-CM

## 2024-03-20 MED ORDER — DOXYCYCLINE HYCLATE 100 MG PO CAPS: 100.0000 mg | ORAL_CAPSULE | Freq: Two times a day (BID) | ORAL | 0 refills | Status: AC

## 2024-03-23 ENCOUNTER — Other Ambulatory Visit: Payer: Self-pay | Admitting: Obstetrics and Gynecology

## 2024-03-23 DIAGNOSIS — N952 Postmenopausal atrophic vaginitis: Secondary | ICD-10-CM

## 2024-03-27 ENCOUNTER — Encounter: Payer: Self-pay | Admitting: Family Medicine

## 2024-03-27 MED ORDER — METRONIDAZOLE 0.75 % EX CREA: TOPICAL_CREAM | Freq: Two times a day (BID) | CUTANEOUS | 2 refills | Status: AC

## 2024-03-27 NOTE — Telephone Encounter (Signed)
 I sent in MetroCream  to use twice daily.  Topicals can take longer to see effect than oral.  However, hopefully will be tolerated better and can be effective over time.  Wolm LELON Scarlet MD [facility]

## 2024-03-28 DIAGNOSIS — M9902 Segmental and somatic dysfunction of thoracic region: Secondary | ICD-10-CM | POA: Diagnosis not present

## 2024-03-28 DIAGNOSIS — M9901 Segmental and somatic dysfunction of cervical region: Secondary | ICD-10-CM | POA: Diagnosis not present

## 2024-03-28 DIAGNOSIS — G8929 Other chronic pain: Secondary | ICD-10-CM | POA: Diagnosis not present

## 2024-03-28 DIAGNOSIS — M546 Pain in thoracic spine: Secondary | ICD-10-CM | POA: Diagnosis not present

## 2024-03-28 DIAGNOSIS — M545 Low back pain, unspecified: Secondary | ICD-10-CM | POA: Diagnosis not present

## 2024-03-28 DIAGNOSIS — M9906 Segmental and somatic dysfunction of lower extremity: Secondary | ICD-10-CM | POA: Diagnosis not present

## 2024-03-28 DIAGNOSIS — M25511 Pain in right shoulder: Secondary | ICD-10-CM | POA: Diagnosis not present

## 2024-03-28 DIAGNOSIS — M9907 Segmental and somatic dysfunction of upper extremity: Secondary | ICD-10-CM | POA: Diagnosis not present

## 2024-03-29 DIAGNOSIS — F439 Reaction to severe stress, unspecified: Secondary | ICD-10-CM | POA: Diagnosis not present

## 2024-04-03 ENCOUNTER — Encounter: Admitting: Physical Therapy

## 2024-04-03 ENCOUNTER — Ambulatory Visit: Admitting: Physical Therapy

## 2024-04-04 ENCOUNTER — Other Ambulatory Visit: Payer: Self-pay | Admitting: Medical Genetics

## 2024-04-04 DIAGNOSIS — Z006 Encounter for examination for normal comparison and control in clinical research program: Secondary | ICD-10-CM

## 2024-04-05 ENCOUNTER — Encounter: Admitting: Physical Therapy

## 2024-04-06 DIAGNOSIS — F439 Reaction to severe stress, unspecified: Secondary | ICD-10-CM | POA: Diagnosis not present

## 2024-04-08 ENCOUNTER — Other Ambulatory Visit: Payer: Self-pay | Admitting: Family Medicine

## 2024-04-09 ENCOUNTER — Encounter: Admitting: Physical Therapy

## 2024-04-10 DIAGNOSIS — F439 Reaction to severe stress, unspecified: Secondary | ICD-10-CM | POA: Diagnosis not present

## 2024-04-12 ENCOUNTER — Encounter: Admitting: Physical Therapy

## 2024-04-16 ENCOUNTER — Encounter: Admitting: Physical Therapy

## 2024-04-16 DIAGNOSIS — M9902 Segmental and somatic dysfunction of thoracic region: Secondary | ICD-10-CM | POA: Diagnosis not present

## 2024-04-16 DIAGNOSIS — M9901 Segmental and somatic dysfunction of cervical region: Secondary | ICD-10-CM | POA: Diagnosis not present

## 2024-04-16 DIAGNOSIS — M9903 Segmental and somatic dysfunction of lumbar region: Secondary | ICD-10-CM | POA: Diagnosis not present

## 2024-04-16 DIAGNOSIS — G8929 Other chronic pain: Secondary | ICD-10-CM | POA: Diagnosis not present

## 2024-04-16 DIAGNOSIS — M79671 Pain in right foot: Secondary | ICD-10-CM | POA: Diagnosis not present

## 2024-04-16 DIAGNOSIS — M79672 Pain in left foot: Secondary | ICD-10-CM | POA: Diagnosis not present

## 2024-04-16 DIAGNOSIS — M545 Low back pain, unspecified: Secondary | ICD-10-CM | POA: Diagnosis not present

## 2024-04-16 DIAGNOSIS — M9906 Segmental and somatic dysfunction of lower extremity: Secondary | ICD-10-CM | POA: Diagnosis not present

## 2024-04-17 ENCOUNTER — Ambulatory Visit: Admitting: Family Medicine

## 2024-04-17 ENCOUNTER — Other Ambulatory Visit (HOSPITAL_COMMUNITY)
Admission: RE | Admit: 2024-04-17 | Discharge: 2024-04-17 | Disposition: A | Discharge status: Home / Self Care | Source: Ambulatory Visit | Attending: Radiology | Admitting: Radiology

## 2024-04-17 ENCOUNTER — Encounter: Payer: Self-pay | Admitting: Radiology

## 2024-04-17 ENCOUNTER — Ambulatory Visit: Admitting: Radiology

## 2024-04-17 ENCOUNTER — Ambulatory Visit (INDEPENDENT_AMBULATORY_CARE_PROVIDER_SITE_OTHER): Admitting: Radiology

## 2024-04-17 VITALS — BP 118/72 | HR 83 | Ht 63.25 in | Wt 144.0 lb

## 2024-04-17 DIAGNOSIS — Z01419 Encounter for gynecological examination (general) (routine) without abnormal findings: Secondary | ICD-10-CM | POA: Diagnosis not present

## 2024-04-17 DIAGNOSIS — Z1331 Encounter for screening for depression: Secondary | ICD-10-CM

## 2024-04-17 DIAGNOSIS — R6882 Decreased libido: Secondary | ICD-10-CM | POA: Diagnosis not present

## 2024-04-17 DIAGNOSIS — Z79890 Hormone replacement therapy (postmenopausal): Secondary | ICD-10-CM | POA: Diagnosis not present

## 2024-04-17 MED ORDER — ESTRADIOL 0.05 MG/24HR TD PTTW: 1.0000 | MEDICATED_PATCH | TRANSDERMAL | 4 refills | Status: DC

## 2024-04-17 MED ORDER — TESTOSTERONE 20.25 MG/ACT (1.62%) TD GEL: 1.0000 | TRANSDERMAL | 0 refills | Status: DC

## 2024-04-17 MED ORDER — PROGESTERONE MICRONIZED 100 MG PO CAPS: 200.0000 mg | ORAL_CAPSULE | Freq: Every day | ORAL | 4 refills | Status: DC

## 2024-04-17 NOTE — Progress Notes (Signed)
 Gabriela Shepard 1961-04-22 [id number]   History: Postmenopausal 63 y.o. presents for annual exam. Started on HRT, feels better but would like to increase doses. Also interested in testosterone for low libido and low muscle mass.    Gynecologic History Postmenopausal Last Pap: 2019. Results were: normal Last mammogram: 8/22. Results were: normal Last colonoscopy: 3/25 DEXA:2022   Obstetric History OB History  Gravida Para Term Preterm AB Living  2 2 2   2   SAB IAB Ectopic Multiple Live Births      2    # Outcome Date GA Lbr Len/2nd Weight Sex Type Anes PTL Lv  2 Term      Vag-Spont   LIV  1 Term      CS-Unspec   LIV       04/17/2024    2:10 PM 07/25/2023    1:17 PM 07/18/2023    1:06 PM  Depression screen PHQ 2/9  Decreased Interest 0 0 0  Down, Depressed, Hopeless 0 0 0  PHQ - 2 Score 0 0 0     The following portions of the patient's history were reviewed and updated as appropriate: allergies, current medications, past family history, past medical history, past social history, past surgical history, and problem list.  Review of Systems Pertinent items noted in HPI and remainder of comprehensive ROS otherwise negative.  Past medical history, past surgical history, family history and social history were all reviewed and documented in the EPIC chart.  Exam:  Vitals:   04/17/24 1408  BP: 118/72  Pulse: 83  SpO2: 97%  Weight: 144 lb (65.3 kg)  Height: 5' 3.25 (1.607 m)   Body mass index is 25.31 kg/m.  General appearance:  Normal Thyroid :  Symmetrical, normal in size, without palpable masses or nodularity. Respiratory  Auscultation:  Clear without wheezing or rhonchi Cardiovascular  Auscultation:  Regular rate, without rubs, murmurs or gallops  Edema/varicosities:  Not grossly evident Abdominal  Soft,nontender, without masses, guarding or rebound.  Liver/spleen:  No organomegaly noted  Hernia:  None appreciated  Skin  Inspection:  Grossly  normal Breasts: Examined lying and sitting.   Right: Without masses, retractions, nipple discharge or axillary adenopathy.   Left: Without masses, retractions, nipple discharge or axillary adenopathy. Genitourinary   Inguinal/mons:  Normal without inguinal adenopathy  External genitalia:  Normal appearing vulva with no masses, tenderness, or lesions  BUS/Urethra/Skene's glands:  Normal  Vagina:  Normal appearing with normal color and discharge, no lesions. Atrophy: mild   Cervix:  Normal appearing without discharge or lesions  Uterus:  Normal in size, shape and contour.  Midline and mobile, nontender  Adnexa/parametria:     Rt: Normal in size, without masses or tenderness.   Lt: Normal in size, without masses or tenderness.  Anus and perineum: Normal    Darice Hoit, CMA present for exam  Assessment/Plan:   1. Well woman exam with routine gynecological exam (Primary) - Cytology - PAP( [facility])  2. Hormone replacement therapy (HRT) - estradiol  (VIVELLE -DOT) 0.05 MG/24HR patch; Place 1 patch (0.05 mg total) onto the skin 2 (two) times a week.  Dispense: 24 patch; Refill: 4 - progesterone  (PROMETRIUM ) 100 MG capsule; Take 2 capsules (200 mg total) by mouth daily.  Dispense: 180 capsule; Refill: 4  3. Depression screening  4. Low libido - Testos,Total,Free and SHBG (Female) - Testos,Total,Free and SHBG (Female); Future - Testosterone (ANDROGEL PUMP) 20.25 MG/ACT (1.62%) GEL; Place 1 Pump onto the skin every other day.  Dispense:  75 g; Refill: 0     Return in 3 months for follow up labs  GINETTE COZIER B WHNP-BC, 2:31 PM 04/17/2024

## 2024-04-18 ENCOUNTER — Ambulatory Visit: Admitting: Family Medicine

## 2024-04-19 ENCOUNTER — Ambulatory Visit: Attending: Family Medicine | Admitting: Physical Therapy

## 2024-04-19 DIAGNOSIS — M6281 Muscle weakness (generalized): Secondary | ICD-10-CM | POA: Insufficient documentation

## 2024-04-19 DIAGNOSIS — R279 Unspecified lack of coordination: Secondary | ICD-10-CM | POA: Diagnosis not present

## 2024-04-19 DIAGNOSIS — R293 Abnormal posture: Secondary | ICD-10-CM | POA: Diagnosis not present

## 2024-04-19 DIAGNOSIS — M62838 Other muscle spasm: Secondary | ICD-10-CM | POA: Diagnosis not present

## 2024-04-19 NOTE — Patient Instructions (Signed)

## 2024-04-19 NOTE — Therapy (Signed)
 OUTPATIENT PHYSICAL THERAPY FEMALE PELVIC TREATMENT   Patient Name: Gabriela Shepard MRN: [id number] DOB:[birth year], 63 y.o., female Today's Date: 04/19/2024  END OF SESSION:  PT End of Session - 04/19/24 1232     Visit Number 5    Date for Recertification  08/26/24    Authorization Type [employer]    PT Start Time 1150    PT Stop Time 1230    PT Time Calculation (min) 40 min    Activity Tolerance Patient tolerated treatment well    Behavior During Therapy WFL for tasks assessed/performed            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   History of UTI    Interstitial cystitis    Low sodium levels    Migraines    Osteoporosis    PONV (postoperative nausea and vomiting)    PTSD (post-traumatic stress disorder)    on meds   STD (sexually transmitted disease)    Substance abuse (HCC)    hx of alcohol   Urine incontinence    Past Surgical History:  Procedure Laterality Date   BREAST BIOPSY Left    2017 benign x 2   CESAREAN SECTION  1992   COLONOSCOPY  2018   JMP-MAC-suprep(good)-polyps   INCONTINENCE SURGERY     LAPAROSCOPY     under bladder   NECK SURGERY  02/24/2018   ACDF   POLYPECTOMY  2018   polyps removed   TOTAL HIP ARTHROPLASTY Right 09/08/2020   TOTAL HIP ARTHROPLASTY Left 2018   TOTAL KNEE ARTHROPLASTY Left 12/23/2015   Procedure: TOTAL KNEE ARTHROPLASTY;  Surgeon: Maude Herald, MD;  Location: [facility] OR;  Service: Orthopedics;  Laterality: Left;   Patient Active Problem List   Diagnosis Date Noted   Abdominal pain, epigastric 08/04/2023   Encounter for long-term (current) use of NSAIDs 08/04/2023   Heartburn 08/04/2023   Iron  deficiency anemia 07/18/2023   B12 deficiency 06/14/2023   Left wrist pain 04/07/2023   Fibromyalgia 10/27/2022   Lumbar radiculopathy 09/25/2021   Low serum sodium 06/05/2021   MDD (major depressive disorder), recurrent severe, without psychosis (HCC) 06/03/2021   Impaired fasting glucose 06/03/2021   Osteoporosis 06/03/2021   Other specified abnormal immunological findings in serum 06/03/2021   Vitamin D  deficiency 06/03/2021   PTSD (post-traumatic stress disorder) 06/03/2021   Thyroid  nodule 03/24/2021   Lung nodule 03/03/2021   Easy bruising 03/03/2021   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   Globus sensation 12/23/2020   Left thyroid  nodule 12/23/2020   Polyarthralgia 10/28/2020   Interstitial cystitis 07/23/2020   Levator spasm 07/23/2020   Age-related osteoporosis without current pathological fracture 08/06/2019   Hyponatremia 08/06/2019   Multiple thyroid  nodules 08/06/2019   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   Piriformis syndrome, right 08/09/2018   Weight gain 02/08/2018   Trigger point of right shoulder region 12/22/2017   Cervical radiculopathy at C6 11/29/2017   Osteopenia 03/09/2016   Primary osteoarthritis of left knee 12/23/2015   IBS (irritable bowel syndrome) 02/19/2015   Postmenopausal 12/19/2013   Migraine  headache 12/05/2012   Pain, upper back 09/24/2010   Depression, recurrent 09/04/2010    PCP: Micheal Wolm ORN, MD   REFERRING PROVIDER: Micheal Wolm ORN, MD   REFERRING DIAG: N81.89 (ICD-10-CM) - Pelvic floor weakness  THERAPY DIAG:  Other muscle spasm  Rationale for Evaluation and Treatment: Rehabilitation  ONSET DATE: 5 weeks ago  SUBJECTIVE:                                                                                                                                                                                           SUBJECTIVE STATEMENT: Night time voids down to 1-2x nightly usually once. At least 5 hours between voids at night,  all other pain improved greatly now just the constant burning at urethra but lower. And urgency still happening    eval Increased urinary frequency at night but has since had been on hormones and decreased from 5x to 2x. Also reports pressure at bladder and notices relief with bearing down. Does report pain at bladder and vaginal area intermittently and this feels better with bearing down too. Has been getting massages and doing more stretching.  MD prescribed OAB medication but reports she had urinary hesitancy but does help with pressure/pain. Has started decreasing it now.   Fluid intake: total - 60 oz  but unsure   FUNCTIONAL LIMITATIONS: sleeping, sometimes needs to call out of work,  PERTINENT HISTORY:  Medications for current condition: yes see chart Surgeries: Bil THA, Lt TKA Other: Osteopenia, depression, anxiety, IC, Fibromyalgia Sexual abuse: No  DIAGNOSTIC FINDINGS:  Post-void residual: Voiding Cystourethrogram (VCUG):  Ultrasound: PAIN:  Are you having pain? Yes NPRS scale: 3/10 Pain location: bladder and vaginal  Pain type: aching and burning Pain description: intermittent   Aggravating factors: filling bladder Relieving factors: emptying bladder  PRECAUTIONS: None  RED FLAGS: None   WEIGHT BEARING RESTRICTIONS: No  FALLS:  Has patient fallen in last 6 months? No  OCCUPATION: yes  ACTIVITY LEVEL : low now decreased due to pain but was moderate  PLOF: Independent  PATIENT GOALS: to have better bladder habits, and fully relieve    BOWEL MOVEMENT: Pain with bowel movement: No Type of bowel movement:Type (Bristol Stool Scale) 1-2, Frequency every 3 days, and Strain to get started Fully empty rectum: No Leakage: No                                                   Pads: No Fiber supplement/laxative No  URINATION: Pain with urination: No Fully empty bladder: No                                 Post-void dribble: No Stream: Strong but sometimes  hesitation to get started  Urgency: Yes every once in a while Frequency:during the day 3-4x                                                          Nocturia: Yes:  2x now   Leakage: none Pads/briefs: No  INTERCOURSE:  Ability to have vaginal penetration Yes  Pain with intercourse: none Dryness: yes internally and externally Climax: not painful Marinoff Scale: 0/3 Low libido   PREGNANCY: Vaginal deliveries 1 Tearing Yes:   Episiotomy No C-section deliveries 1 Currently pregnant No  PROLAPSE: Pressure   OBJECTIVE:  Note: Objective measures were completed at Evaluation unless otherwise noted.    COGNITION: Overall cognitive status: Within functional limits for tasks assessed     SENSATION: Light touch: Appears intact  FUNCTIONAL TESTS:   Single leg stance: mild pelvic rotation and sway  Rt:8s   Lt:8s Sit-up test:1/3   GAIT: WFL  POSTURE: rounded shoulders, forward head, and posterior pelvic tilt   LUMBARAROM/PROM:  A/PROM A/PROM  Eval (% available)  Flexion Limited by 25%  Extension WFL  Right lateral flexion Limited by 25%  Left lateral flexion Limited by 25%  Right rotation Limited by 25%  Left rotation Limited by 25%   (Blank rows = not tested)  LOWER EXTREMITY ROM:  Bil hamstrings, adductors, hip ER, hip IR Limited by 25%  LOWER EXTREMITY MMT:  Bil hips grossly 4/5 PALPATION:  General: tightness noted in bil thoracic and lumbar paraspinals  Pelvic Alignment: WFL  Abdominal: tension throughout all quadrants, TTP and greatest tension at lower mid quadrant  Breathing: chest breathing but with cues able to complete diaphragmatic breathing                  External Perineal Exam: dryness throughout vulva, no TTP, but lightened tissue coloring, mild labia adhesion, mild clitoral hood adhesion                              Internal Pelvic Floor: TTP bil obturators, anterior pelvic floor quadrant with several trigger points  Patient  confirms identification and approves PT to assess internal pelvic floor and treatment Yes No emotional/communication barriers or cognitive limitation. Patient is motivated to learn. Patient understands and agrees with treatment goals and plan. PT explains patient will be examined in standing, sitting, and lying down to see how their muscles and joints work. When they are ready, they will be asked to remove their underwear so PT can examine their perineum. The patient is also given the option of providing their own chaperone as one is not provided in our facility. The patient also has the right and is explained the right to defer or refuse any part of the evaluation or treatment including the internal exam. With the patient's consent, PT will use one gloved finger to gently assess the muscles of the pelvic floor, seeing how well it contracts and relaxes and if there is muscle symmetry. After, the  patient will get dressed and PT and patient will discuss exam findings and plan of care. PT and patient discuss plan of care, schedule, attendance policy and HEP activities.  PELVIC MMT:   MMT eval  Vaginal 2-3/5; 4s; 4 reps  Internal Anal Sphincter   External Anal Sphincter   Puborectalis   Diastasis Recti   (Blank rows = not tested)        TONE: WFL  PROLAPSE: Not seen in hooklying with cough   TODAY'S TREATMENT:                                                                                                                              DATE:   04/19/24; Reviewed urge drill to continue as pt reports she has urge but then small amount of urine empties - to do drill  more consistently and see if this decreases urges for smaller voids  Patient consented to internal pelvic floor treatment vaginally this date and found to have continued tension at anterior pelvic floor quadrant and both sides of urethra with gentle STM, trigger point release and bimanual external release at mid lower abdominal quadrant to  improve tissue relaxation with good effect. No trigger points or tenderness at obturators today, no restrictions at clitoral hood or surrounding external tissue today   03/13/24: Patient consented to internal pelvic floor treatment vaginally this date and found to have continued tension at anterior pelvic floor quadrant with gentle STM, trigger point release and bimanual external release at mid lower abdominal quadrant to improve tissue relaxation with good effect. STM and release at bil obturators with pt directed to have dynamic mobility of marching on same side with manual and hip IR/ER with manual at obturator completed bil and pt noted great relief with this.  External manual at clitoral hood and bil ischiocavernosus for improved mobility and pt reports she felt this at inside under bladder.  Gloves changed and external abdominal fascia release work throughout lower and mid quadrants   03/06/24 Patient consented to internal pelvic floor treatment vaginally this date and found to have continued tension at anterior pelvic floor quadrant with gentle STM, trigger point release and bimanual external release at mid lower abdominal quadrant to improve tissue relaxation with good effect. Once released, gloves changes, abdominal fascial release completed with direct technique in mid and lower quadrants. Pt also reports last session had rectal manual treatment to pelvic floor and had discomfort and requested to attempt this again today as she wasn't having pain and wanted to try again. This was completed post hand hygiene and gloves changed. Pt reports she had greatly less pain today, did have one trigger point at superficial lt side but released well.    PATIENT EDUCATION:  Education details: [id number] Person educated: Patient Education method: Explanation, Demonstration, Tactile cues, Verbal cues, and Handouts Education comprehension: verbalized understanding, returned demonstration, verbal cues required,  tactile cues required, and needs further education  HOME EXERCISE  PROGRAM: [id number]  ASSESSMENT:  CLINICAL IMPRESSION: Patient is a 63 y.o. female  who was seen today for physical therapy evaluation and treatment for bladder and pelvic floor pain. Pt tolerated treatment well, had no pain at end of session. Does continue to benefit from cues for relaxation and manual work externally and internally for improved tissue mobility and decreased pain. Pt very complaint and attempts recommendations at home but difficult to achieve deeper internal release work with wand vaginally per pt. Greatly benefits from internal work in clinic from skilled PT. Pt would benefit from additional PT to further address deficits.    OBJECTIVE IMPAIRMENTS: decreased activity tolerance, decreased coordination, decreased endurance, decreased mobility, decreased strength, increased fascial restrictions, impaired perceived functional ability, increased muscle spasms, impaired flexibility, improper body mechanics, postural dysfunction, and pain.   ACTIVITY LIMITATIONS: carrying, lifting, bending, sleeping, and locomotion level  PARTICIPATION LIMITATIONS: interpersonal relationship, community activity, and yard work  PERSONAL FACTORS: Time since onset of injury/illness/exacerbation and 1 comorbidity: medical history are also affecting patient's functional outcome.   REHAB POTENTIAL: Good  CLINICAL DECISION MAKING: Stable/uncomplicated  EVALUATION COMPLEXITY: Low   GOALS: Goals reviewed with patient? Yes  SHORT TERM GOALS: Target date: 03/26/24 All goals updated 04/19/24 Pt to be I with HEP for carry over and continuing recommendations for improved outcomes.   Baseline: Goal status: MET  2.  Pt will be independent with the knack, urge suppression technique, and double voiding in order to improve bladder habits and decrease urinary incontinence.   Baseline:  Goal status: MET  3.  Pt to report no more than 2x  night time urination voids on average with or without medication per MD recommendation.  Baseline:  Goal status: MET  4.  Pt will be independent with use of squatty potty, relaxed toileting mechanics, and improved bowel movement techniques in order to increase ease of bowel movements and complete evacuation.   Baseline:  Goal status: MET  LONG TERM GOALS: Target date: 08/26/24  Pt to be I with advanced HEP for carry over and continuing recommendations for improved outcomes.   Baseline:  Goal status: INITIAL  2.  Pt will report her bowel movements are complete at least 4 times a week due to improved bowel habits and evacuation techniques.  Baseline:  Goal status: INITIAL  3.  Pt will be able to correctly perform diaphragmatic breathing and appropriate pressure management in order to improve pelvic floor A/ROM.   Baseline:  Goal status: INITIAL  4.  Pt to report no more than 2/10 pain at abdomen/bladder for decreased tension at abdomen and pelvic floor to ease emptying bladder. Baseline:  Goal status: INITIAL  5.  Pt to report fully emptying bladder with voids no more than every 2 hours at least 75% of the time for improved bladder habits to complete errands without stopping to use restroom. Baseline:  Goal status: INITIAL  6.  Pt to report no pain with vaginal penetration in deep layer for improved tolerance to medical exams.  Baseline:  Goal status: INITIAL  PLAN:  PT FREQUENCY: 2x/week  PT DURATION: 16 sessions  PLANNED INTERVENTIONS: 97110-Therapeutic exercises, 97530- Therapeutic activity, [id number]- Neuromuscular re-education, 97535- Self Care, [id number]- Manual therapy, [phone]- Canalith repositioning, [id number]- Aquatic Therapy, [phone]- Electrical stimulation (manual), [id number]- Vasopneumatic device, [phone] (1-2 muscles), [id number] (3+ muscles)- Dry Needling, Patient/Family education, Taping, Joint mobilization, Spinal mobilization, Scar mobilization, DME instructions, Cryotherapy, Moist heat,  and Biofeedback  PLAN FOR NEXT SESSION: breathing mechanics, voiding mechanics, abdominal massage, review of  pelvic wand if needed, manual internally if needed and pt agrees. reverse clams  Darryle Navy, PT, DPT 04/20/2511:36 PM  [facility] [address], [address] [place], [place] [address] Phone # [phone] Fax [phone]

## 2024-04-20 ENCOUNTER — Ambulatory Visit: Payer: Self-pay | Admitting: Radiology

## 2024-04-20 LAB — CYTOLOGY - PAP
Adequacy: ABSENT
Comment: NEGATIVE
Diagnosis: NEGATIVE
High risk HPV: NEGATIVE

## 2024-04-20 LAB — TESTOS,TOTAL,FREE AND SHBG (FEMALE)
Free Testosterone: 0.3 pg/mL (ref 0.1–6.4)
Sex Hormone Binding: 52 nmol/L (ref 14–73)
Testosterone, Total, LC-MS-MS: 4 ng/dL (ref 2–45)

## 2024-04-23 ENCOUNTER — Ambulatory Visit: Admitting: Physical Therapy

## 2024-04-25 ENCOUNTER — Ambulatory Visit: Admitting: Radiology

## 2024-04-26 ENCOUNTER — Ambulatory Visit: Admitting: Physical Therapy

## 2024-04-26 DIAGNOSIS — M6281 Muscle weakness (generalized): Secondary | ICD-10-CM

## 2024-04-26 DIAGNOSIS — M62838 Other muscle spasm: Secondary | ICD-10-CM | POA: Diagnosis not present

## 2024-04-26 DIAGNOSIS — R279 Unspecified lack of coordination: Secondary | ICD-10-CM | POA: Diagnosis not present

## 2024-04-26 DIAGNOSIS — R293 Abnormal posture: Secondary | ICD-10-CM

## 2024-04-26 NOTE — Therapy (Signed)
 OUTPATIENT PHYSICAL THERAPY FEMALE PELVIC TREATMENT   Patient Name: Gabriela Shepard MRN: [id number] DOB:01/19/1961, 63 y.o., female Today's Date: 04/26/2024  END OF SESSION:  PT End of Session - 04/26/24 1237     Visit Number 6    Date for Recertification  08/26/24    Authorization Type [employer]    PT Start Time 1232    PT Stop Time 1312    PT Time Calculation (min) 40 min    Activity Tolerance Patient tolerated treatment well    Behavior During Therapy WFL for tasks assessed/performed            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   History of UTI    Interstitial cystitis    Low sodium levels    Migraines    Osteoporosis    PONV (postoperative nausea and vomiting)    PTSD (post-traumatic stress disorder)    on meds   STD (sexually transmitted disease)    Substance abuse (HCC)    hx of alcohol   Urine incontinence    Past Surgical History:  Procedure Laterality Date   BREAST BIOPSY Left    2017 benign x 2   CESAREAN SECTION  1992   COLONOSCOPY  2018   JMP-MAC-suprep(good)-polyps   INCONTINENCE SURGERY     LAPAROSCOPY     under bladder   NECK SURGERY  02/24/2018   ACDF   POLYPECTOMY  2018   polyps removed   TOTAL HIP ARTHROPLASTY Right 09/08/2020   TOTAL HIP ARTHROPLASTY Left 2018   TOTAL KNEE ARTHROPLASTY Left 12/23/2015   Procedure: TOTAL KNEE ARTHROPLASTY;  Surgeon: Maude Herald, MD;  Location: [facility] OR;  Service: Orthopedics;  Laterality: Left;   Patient Active Problem List   Diagnosis Date Noted   Abdominal pain, epigastric 08/04/2023   Encounter for long-term (current) use of NSAIDs 08/04/2023   Heartburn 08/04/2023   Iron  deficiency anemia 07/18/2023   B12 deficiency 06/14/2023   Left wrist pain 04/07/2023   Fibromyalgia 10/27/2022   Lumbar radiculopathy 09/25/2021   Low serum sodium 06/05/2021   MDD (major depressive disorder), recurrent severe, without psychosis (HCC) 06/03/2021   Impaired fasting glucose 06/03/2021   Osteoporosis 06/03/2021   Other specified abnormal immunological findings in serum 06/03/2021   Vitamin D  deficiency 06/03/2021   PTSD (post-traumatic stress disorder) 06/03/2021   Thyroid  nodule 03/24/2021   Lung nodule 03/03/2021   Easy bruising 03/03/2021   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   Globus sensation 12/23/2020   Left thyroid  nodule 12/23/2020   Polyarthralgia 10/28/2020   Interstitial cystitis 07/23/2020   Levator spasm 07/23/2020   Age-related osteoporosis without current pathological fracture 08/06/2019   Hyponatremia 08/06/2019   Multiple thyroid  nodules 08/06/2019   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   Piriformis syndrome, right 08/09/2018   Weight gain 02/08/2018   Trigger point of right shoulder region 12/22/2017   Cervical radiculopathy at C6 11/29/2017   Osteopenia 03/09/2016   Primary osteoarthritis of left knee 12/23/2015   IBS (irritable bowel syndrome) 02/19/2015   Postmenopausal 12/19/2013   Migraine  headache 12/05/2012   Pain, upper back 09/24/2010   Depression, recurrent 09/04/2010    PCP: Micheal Wolm ORN, MD   REFERRING PROVIDER: Micheal Wolm ORN, MD   REFERRING DIAG: N81.89 (ICD-10-CM) - Pelvic floor weakness  THERAPY DIAG:  Muscle weakness (generalized)  Abnormal posture  Unspecified lack of coordination  Rationale for Evaluation and Treatment: Rehabilitation  ONSET DATE: 5 weeks ago  SUBJECTIVE:                                                                                                                                                                                           SUBJECTIVE STATEMENT: Continued to have good  improvement with urine. Has had tension continuing but very stress/trauma related.    eval Increased urinary frequency at night but has since had been on hormones and decreased from 5x to 2x. Also reports pressure at bladder and notices relief with bearing down. Does report pain at bladder and vaginal area intermittently and this feels better with bearing down too. Has been getting massages and doing more stretching.  MD prescribed OAB medication but reports she had urinary hesitancy but does help with pressure/pain. Has started decreasing it now.   Fluid intake: total - 60 oz  but unsure   FUNCTIONAL LIMITATIONS: sleeping, sometimes needs to call out of work,  PERTINENT HISTORY:  Medications for current condition: yes see chart Surgeries: Bil THA, Lt TKA Other: Osteopenia, depression, anxiety, IC, Fibromyalgia Sexual abuse: No  DIAGNOSTIC FINDINGS:  Post-void residual: Voiding Cystourethrogram (VCUG):  Ultrasound: PAIN:  not at this appointment 11/20   Are you having pain? Yes NPRS scale: 3/10 Pain location: bladder and vaginal  Pain type: aching and burning Pain description: intermittent   Aggravating factors: filling bladder Relieving factors: emptying bladder  PRECAUTIONS: None  RED FLAGS: None   WEIGHT BEARING RESTRICTIONS: No  FALLS:  Has patient fallen in last 6 months? No  OCCUPATION: yes  ACTIVITY LEVEL : low now decreased due to pain but was moderate  PLOF: Independent  PATIENT GOALS: to have better bladder habits, and fully relieve    BOWEL MOVEMENT: Pain with bowel movement: No Type of bowel movement:Type (Bristol Stool Scale) 1-2, Frequency every 3 days, and Strain to get started Fully empty rectum: No Leakage: No                                                   Pads: No Fiber supplement/laxative No  URINATION: Pain with urination: No  Fully empty bladder: No                                 Post-void dribble: No Stream: Strong but sometimes  hesitation to get started  Urgency: Yes every once in a while Frequency:during the day 3-4x                                                          Nocturia: Yes:  2x now   Leakage: none Pads/briefs: No  INTERCOURSE:  Ability to have vaginal penetration Yes  Pain with intercourse: none Dryness: yes internally and externally Climax: not painful Marinoff Scale: 0/3 Low libido   PREGNANCY: Vaginal deliveries 1 Tearing Yes:   Episiotomy No C-section deliveries 1 Currently pregnant No  PROLAPSE: Pressure   OBJECTIVE:  Note: Objective measures were completed at Evaluation unless otherwise noted.    COGNITION: Overall cognitive status: Within functional limits for tasks assessed     SENSATION: Light touch: Appears intact  FUNCTIONAL TESTS:   Single leg stance: mild pelvic rotation and sway  Rt:8s   Lt:8s Sit-up test:1/3   GAIT: WFL  POSTURE: rounded shoulders, forward head, and posterior pelvic tilt   LUMBARAROM/PROM:  A/PROM A/PROM  Eval (% available)  Flexion Limited by 25%  Extension WFL  Right lateral flexion Limited by 25%  Left lateral flexion Limited by 25%  Right rotation Limited by 25%  Left rotation Limited by 25%   (Blank rows = not tested)  LOWER EXTREMITY ROM:  Bil hamstrings, adductors, hip ER, hip IR Limited by 25%  LOWER EXTREMITY MMT:  Bil hips grossly 4/5 PALPATION:  General: tightness noted in bil thoracic and lumbar paraspinals  Pelvic Alignment: WFL  Abdominal: tension throughout all quadrants, TTP and greatest tension at lower mid quadrant  Breathing: chest breathing but with cues able to complete diaphragmatic breathing                  External Perineal Exam: dryness throughout vulva, no TTP, but lightened tissue coloring, mild labia adhesion, mild clitoral hood adhesion                              Internal Pelvic Floor: TTP bil obturators, anterior pelvic floor quadrant with several trigger points  Patient  confirms identification and approves PT to assess internal pelvic floor and treatment Yes No emotional/communication barriers or cognitive limitation. Patient is motivated to learn. Patient understands and agrees with treatment goals and plan. PT explains patient will be examined in standing, sitting, and lying down to see how their muscles and joints work. When they are ready, they will be asked to remove their underwear so PT can examine their perineum. The patient is also given the option of providing their own chaperone as one is not provided in our facility. The patient also has the right and is explained the right to defer or refuse any part of the evaluation or treatment including the internal exam. With the patient's consent, PT will use one gloved finger to gently assess the muscles of the pelvic floor, seeing how well it contracts and relaxes and if there is muscle symmetry. After, the patient will get dressed and  PT and patient will discuss exam findings and plan of care. PT and patient discuss plan of care, schedule, attendance policy and HEP activities.  PELVIC MMT:   MMT eval  Vaginal 2-3/5; 4s; 4 reps  Internal Anal Sphincter   External Anal Sphincter   Puborectalis   Diastasis Recti   (Blank rows = not tested)        TONE: WFL  PROLAPSE: Not seen in hooklying with cough   TODAY'S TREATMENT:                                                                                                                              DATE:   04/26/24: Patient consented to internal pelvic floor treatment vaginally this date and found to have continued tension at anterior pelvic floor quadrant and both sides of urethra with gentle STM, trigger point release and bimanual external release at mid lower abdominal quadrant to improve tissue relaxation with good effect. No trigger points or tenderness at obturators today, no restrictions at clitoral hood or surrounding external tissue today. Bimanual with  mid/low abdomen fascial release with internal urethral release.    04/19/24; Reviewed urge drill to continue as pt reports she has urge but then small amount of urine empties - to do drill  more consistently and see if this decreases urges for smaller voids  Patient consented to internal pelvic floor treatment vaginally this date and found to have continued tension at anterior pelvic floor quadrant and both sides of urethra with gentle STM, trigger point release and bimanual external release at mid lower abdominal quadrant to improve tissue relaxation with good effect. No trigger points or tenderness at obturators today, no restrictions at clitoral hood or surrounding external tissue today     PATIENT EDUCATION:  Education details: [id number] Person educated: Patient Education method: Explanation, Demonstration, Tactile cues, Verbal cues, and Handouts Education comprehension: verbalized understanding, returned demonstration, verbal cues required, tactile cues required, and needs further education  HOME EXERCISE PROGRAM: [id number]  ASSESSMENT:  CLINICAL IMPRESSION: Patient is a 63 y.o. female  who was seen today for physical therapy evaluation and treatment for bladder and pelvic floor pain. Pt tolerated treatment well, had no pain at end of session. Does continue to benefit from cues for relaxation and manual work externally and internally for improved tissue mobility and decreased pain. Pt very complaint and attempts recommendations at home but difficult to achieve deeper internal release work with wand vaginally per pt. Greatly benefits from internal work in clinic from skilled PT. Pt would benefit from additional PT to further address deficits.    OBJECTIVE IMPAIRMENTS: decreased activity tolerance, decreased coordination, decreased endurance, decreased mobility, decreased strength, increased fascial restrictions, impaired perceived functional ability, increased muscle spasms, impaired  flexibility, improper body mechanics, postural dysfunction, and pain.   ACTIVITY LIMITATIONS: carrying, lifting, bending, sleeping, and locomotion level  PARTICIPATION LIMITATIONS: interpersonal relationship, community activity, and yard work  PERSONAL FACTORS: Time since onset  of injury/illness/exacerbation and 1 comorbidity: medical history are also affecting patient's functional outcome.   REHAB POTENTIAL: Good  CLINICAL DECISION MAKING: Stable/uncomplicated  EVALUATION COMPLEXITY: Low   GOALS: Goals reviewed with patient? Yes  SHORT TERM GOALS: Target date: 03/26/24 All goals updated 04/19/24 Pt to be I with HEP for carry over and continuing recommendations for improved outcomes.   Baseline: Goal status: MET  2.  Pt will be independent with the knack, urge suppression technique, and double voiding in order to improve bladder habits and decrease urinary incontinence.   Baseline:  Goal status: MET  3.  Pt to report no more than 2x night time urination voids on average with or without medication per MD recommendation.  Baseline:  Goal status: MET  4.  Pt will be independent with use of squatty potty, relaxed toileting mechanics, and improved bowel movement techniques in order to increase ease of bowel movements and complete evacuation.   Baseline:  Goal status: MET  LONG TERM GOALS: Target date: 08/26/24  Pt to be I with advanced HEP for carry over and continuing recommendations for improved outcomes.   Baseline:  Goal status: INITIAL  2.  Pt will report her bowel movements are complete at least 4 times a week due to improved bowel habits and evacuation techniques.  Baseline:  Goal status: INITIAL  3.  Pt will be able to correctly perform diaphragmatic breathing and appropriate pressure management in order to improve pelvic floor A/ROM.   Baseline:  Goal status: INITIAL  4.  Pt to report no more than 2/10 pain at abdomen/bladder for decreased tension at abdomen and  pelvic floor to ease emptying bladder. Baseline:  Goal status: INITIAL  5.  Pt to report fully emptying bladder with voids no more than every 2 hours at least 75% of the time for improved bladder habits to complete errands without stopping to use restroom. Baseline:  Goal status: INITIAL  6.  Pt to report no pain with vaginal penetration in deep layer for improved tolerance to medical exams.  Baseline:  Goal status: INITIAL  PLAN:  PT FREQUENCY: 2x/week  PT DURATION: 16 sessions  PLANNED INTERVENTIONS: 97110-Therapeutic exercises, 97530- Therapeutic activity, [id number]- Neuromuscular re-education, 97535- Self Care, [id number]- Manual therapy, [phone]- Canalith repositioning, [id number]- Aquatic Therapy, [phone]- Electrical stimulation (manual), [id number]- Vasopneumatic device, [phone] (1-2 muscles), [id number] (3+ muscles)- Dry Needling, Patient/Family education, Taping, Joint mobilization, Spinal mobilization, Scar mobilization, DME instructions, Cryotherapy, Moist heat, and Biofeedback  PLAN FOR NEXT SESSION: breathing mechanics, voiding mechanics, abdominal massage, review of pelvic wand if needed, manual internally if needed and pt agrees. reverse clams  Darryle Navy, PT, DPT 11/20/252:40 PM  [facility] [address] [place], [place] [address] Phone # [phone] Fax [phone]

## 2024-04-27 DIAGNOSIS — F439 Reaction to severe stress, unspecified: Secondary | ICD-10-CM | POA: Diagnosis not present

## 2024-04-30 ENCOUNTER — Ambulatory Visit: Admitting: Radiology

## 2024-04-30 ENCOUNTER — Ambulatory Visit: Admitting: Family Medicine

## 2024-05-01 ENCOUNTER — Ambulatory Visit: Admitting: Physical Therapy

## 2024-05-01 DIAGNOSIS — M62838 Other muscle spasm: Secondary | ICD-10-CM

## 2024-05-01 DIAGNOSIS — R293 Abnormal posture: Secondary | ICD-10-CM | POA: Diagnosis not present

## 2024-05-01 DIAGNOSIS — R279 Unspecified lack of coordination: Secondary | ICD-10-CM | POA: Diagnosis not present

## 2024-05-01 DIAGNOSIS — M6281 Muscle weakness (generalized): Secondary | ICD-10-CM | POA: Diagnosis not present

## 2024-05-01 NOTE — Therapy (Signed)
 OUTPATIENT PHYSICAL THERAPY FEMALE PELVIC TREATMENT   Patient Name: Gabriela Shepard MRN: [id number] DOB:1960-10-18, 63 y.o., female Today's Date: 05/01/2024  END OF SESSION:  PT End of Session - 05/01/24 1151     Visit Number 7    Date for Recertification  08/26/24    Authorization Type [employer]    PT Start Time 1150   arrival   PT Stop Time 1230    PT Time Calculation (min) 40 min    Activity Tolerance Patient tolerated treatment well             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   History of UTI    Interstitial cystitis    Low sodium levels    Migraines    Osteoporosis    PONV (postoperative nausea and vomiting)    PTSD (post-traumatic stress disorder)    on meds   STD (sexually transmitted disease)    Substance abuse (HCC)    hx of alcohol   Urine incontinence    Past Surgical History:  Procedure Laterality Date   BREAST BIOPSY Left    2017 benign x 2   CESAREAN SECTION  1992   COLONOSCOPY  2018   JMP-MAC-suprep(good)-polyps   INCONTINENCE SURGERY     LAPAROSCOPY     under bladder   NECK SURGERY  02/24/2018   ACDF   POLYPECTOMY  2018   polyps removed   TOTAL HIP ARTHROPLASTY Right 09/08/2020   TOTAL HIP ARTHROPLASTY Left 2018   TOTAL KNEE ARTHROPLASTY Left 12/23/2015   Procedure: TOTAL KNEE ARTHROPLASTY;  Surgeon: Maude Herald, MD;  Location: [facility] OR;  Service: Orthopedics;  Laterality: Left;   Patient Active Problem List   Diagnosis Date Noted   Abdominal pain, epigastric 08/04/2023   Encounter for long-term (current) use of NSAIDs 08/04/2023   Heartburn 08/04/2023   Iron deficiency anemia 07/18/2023   B12 deficiency  06/14/2023   Left wrist pain 04/07/2023   Fibromyalgia 10/27/2022   Lumbar radiculopathy 09/25/2021   Low serum sodium 06/05/2021   MDD (major depressive disorder), recurrent severe, without psychosis (HCC) 06/03/2021   Impaired fasting glucose 06/03/2021   Osteoporosis 06/03/2021   Other specified abnormal immunological findings in serum 06/03/2021   Vitamin D  deficiency 06/03/2021   PTSD (post-traumatic stress disorder) 06/03/2021   Thyroid  nodule 03/24/2021   Lung nodule 03/03/2021   Easy bruising 03/03/2021   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   Globus sensation 12/23/2020   Left thyroid  nodule 12/23/2020   Polyarthralgia 10/28/2020   Interstitial cystitis 07/23/2020   Levator spasm 07/23/2020   Age-related osteoporosis without current pathological fracture 08/06/2019   Hyponatremia 08/06/2019   Multiple thyroid  nodules 08/06/2019   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   Piriformis syndrome, right 08/09/2018   Weight gain 02/08/2018   Trigger point of right shoulder region 12/22/2017   Cervical radiculopathy at C6 11/29/2017   Osteopenia 03/09/2016   Primary osteoarthritis of left knee 12/23/2015   IBS (irritable bowel syndrome) 02/19/2015   Postmenopausal 12/19/2013   Migraine headache 12/05/2012   Pain, upper back  09/24/2010   Depression, recurrent 09/04/2010    PCP: Micheal Wolm ORN, MD   REFERRING PROVIDER: Micheal Wolm ORN, MD   REFERRING DIAG: N81.89 (ICD-10-CM) - Pelvic floor weakness  THERAPY DIAG:  Other muscle spasm  Unspecified lack of coordination  Rationale for Evaluation and Treatment: Rehabilitation  ONSET DATE: 5 weeks ago  SUBJECTIVE:                                                                                                                                                                                           SUBJECTIVE STATEMENT: Has not had any burning, no bladder pain. Emptying bladder fully. Has been doing urge drill as needed  and this has helped eliminate just in case pees. Has been doing stretching    eval Increased urinary frequency at night but has since had been on hormones and decreased from 5x to 2x. Also reports pressure at bladder and notices relief with bearing down. Does report pain at bladder and vaginal area intermittently and this feels better with bearing down too. Has been getting massages and doing more stretching.  MD prescribed OAB medication but reports she had urinary hesitancy but does help with pressure/pain. Has started decreasing it now.   Fluid intake: total - 60 oz  but unsure   FUNCTIONAL LIMITATIONS: sleeping, sometimes needs to call out of work,  PERTINENT HISTORY:  Medications for current condition: yes see chart Surgeries: Bil THA, Lt TKA Other: Osteopenia, depression, anxiety, IC, Fibromyalgia Sexual abuse: No  DIAGNOSTIC FINDINGS:  Post-void residual: Voiding Cystourethrogram (VCUG):  Ultrasound: PAIN:  not at this appointment 11/20   Are you having pain? Yes NPRS scale: 3/10 Pain location: bladder and vaginal  Pain type: aching and burning Pain description: intermittent   Aggravating factors: filling bladder Relieving factors: emptying bladder  PRECAUTIONS: None  RED FLAGS: None   WEIGHT BEARING RESTRICTIONS: No  FALLS:  Has patient fallen in last 6 months? No  OCCUPATION: yes  ACTIVITY LEVEL : low now decreased due to pain but was moderate  PLOF: Independent  PATIENT GOALS: to have better bladder habits, and fully relieve    BOWEL MOVEMENT: Pain with bowel movement: No Type of bowel movement:Type (Bristol Stool Scale) 1-2, Frequency every 3 days, and Strain to get started Fully empty rectum: No Leakage: No                                                   Pads: No Fiber supplement/laxative No  URINATION: Pain with urination: No Fully empty bladder: No                                 Post-void dribble: No Stream: Strong but sometimes  hesitation to get started  Urgency: Yes every once in a while Frequency:during the day 3-4x                                                          Nocturia: Yes:  2x now   Leakage: none Pads/briefs: No  INTERCOURSE:  Ability to have vaginal penetration Yes  Pain with intercourse: none Dryness: yes internally and externally Climax: not painful Marinoff Scale: 0/3 Low libido   PREGNANCY: Vaginal deliveries 1 Tearing Yes:   Episiotomy No C-section deliveries 1 Currently pregnant No  PROLAPSE: Pressure   OBJECTIVE:  Note: Objective measures were completed at Evaluation unless otherwise noted.    COGNITION: Overall cognitive status: Within functional limits for tasks assessed     SENSATION: Light touch: Appears intact  FUNCTIONAL TESTS:   Single leg stance: mild pelvic rotation and sway  Rt:8s   Lt:8s Sit-up test:1/3   GAIT: WFL  POSTURE: rounded shoulders, forward head, and posterior pelvic tilt   LUMBARAROM/PROM:  A/PROM A/PROM  Eval (% available)  Flexion Limited by 25%  Extension WFL  Right lateral flexion Limited by 25%  Left lateral flexion Limited by 25%  Right rotation Limited by 25%  Left rotation Limited by 25%   (Blank rows = not tested)  LOWER EXTREMITY ROM:  Bil hamstrings, adductors, hip ER, hip IR Limited by 25%  LOWER EXTREMITY MMT:  Bil hips grossly 4/5 PALPATION:  General: tightness noted in bil thoracic and lumbar paraspinals  Pelvic Alignment: WFL  Abdominal: tension throughout all quadrants, TTP and greatest tension at lower mid quadrant  Breathing: chest breathing but with cues able to complete diaphragmatic breathing                  External Perineal Exam: dryness throughout vulva, no TTP, but lightened tissue coloring, mild labia adhesion, mild clitoral hood adhesion                              Internal Pelvic Floor: TTP bil obturators, anterior pelvic floor quadrant with several trigger points  Patient  confirms identification and approves PT to assess internal pelvic floor and treatment Yes No emotional/communication barriers or cognitive limitation. Patient is motivated to learn. Patient understands and agrees with treatment goals and plan. PT explains patient will be examined in standing, sitting, and lying down to see how their muscles and joints work. When they are ready, they will be asked to remove their underwear so PT can examine their perineum. The patient is also given the option of providing their own chaperone as one is not provided in our facility. The patient also has the right and is explained the right to defer or refuse any part of the evaluation or treatment including the internal exam. With the patient's consent, PT will use one gloved finger to gently assess the muscles of the pelvic floor, seeing how well it contracts and relaxes and if there is muscle symmetry. After, the  patient will get dressed and PT and patient will discuss exam findings and plan of care. PT and patient discuss plan of care, schedule, attendance policy and HEP activities.  PELVIC MMT:   MMT eval  Vaginal 2-3/5; 4s; 4 reps  Internal Anal Sphincter   External Anal Sphincter   Puborectalis   Diastasis Recti   (Blank rows = not tested)        TONE: WFL  PROLAPSE: Not seen in hooklying with cough   TODAY'S TREATMENT:                                                                                                                              DATE:   05/01/25: Patient consented to internal pelvic floor treatment vaginally this date and found to have continued tension at anterior pelvic floor quadrant and both sides of urethra with gentle STM but greatly less than previous sessions, trigger point release and bimanual external release at mid lower abdominal quadrant to improve tissue relaxation with good effect.  Bimanual with mid/low abdomen fascial release with internal urethral release. Pt also has tension  at bil bulbocavernosus PT completed gentle stretch at bil sides one at time with good effect. Pt encouraged to continue relaxation techniques, diaphragmatic breathing mechanics and practicing good posture  04/26/24: Patient consented to internal pelvic floor treatment vaginally this date and found to have continued tension at anterior pelvic floor quadrant and both sides of urethra with gentle STM, trigger point release and bimanual external release at mid lower abdominal quadrant to improve tissue relaxation with good effect. No trigger points or tenderness at obturators today, no restrictions at clitoral hood or surrounding external tissue today. Bimanual with mid/low abdomen fascial release with internal urethral release.    04/19/24; Reviewed urge drill to continue as pt reports she has urge but then small amount of urine empties - to do drill  more consistently and see if this decreases urges for smaller voids  Patient consented to internal pelvic floor treatment vaginally this date and found to have continued tension at anterior pelvic floor quadrant and both sides of urethra with gentle STM, trigger point release and bimanual external release at mid lower abdominal quadrant to improve tissue relaxation with good effect. No trigger points or tenderness at obturators today, no restrictions at clitoral hood or surrounding external tissue today     PATIENT EDUCATION:  Education details: [id number] Person educated: Patient Education method: Explanation, Demonstration, Tactile cues, Verbal cues, and Handouts Education comprehension: verbalized understanding, returned demonstration, verbal cues required, tactile cues required, and needs further education  HOME EXERCISE PROGRAM: [id number]  ASSESSMENT:  CLINICAL IMPRESSION: Patient is a 63 y.o. female  who was seen today for physical therapy evaluation and treatment for bladder and pelvic floor pain. Pt tolerated treatment well, had no pain at  end of session. Does continue to benefit from cues for relaxation and manual work externally and internally for improved tissue mobility and  decreased pain. Pt very complaint and attempts recommendations at home but difficult to achieve deeper internal release work with wand vaginally per pt. Greatly benefits from internal work in clinic from skilled PT as pt reports she has had no burning this week and bladder more fully emptying. Pt would benefit from additional PT to further address deficits.    OBJECTIVE IMPAIRMENTS: decreased activity tolerance, decreased coordination, decreased endurance, decreased mobility, decreased strength, increased fascial restrictions, impaired perceived functional ability, increased muscle spasms, impaired flexibility, improper body mechanics, postural dysfunction, and pain.   ACTIVITY LIMITATIONS: carrying, lifting, bending, sleeping, and locomotion level  PARTICIPATION LIMITATIONS: interpersonal relationship, community activity, and yard work  PERSONAL FACTORS: Time since onset of injury/illness/exacerbation and 1 comorbidity: medical history are also affecting patient's functional outcome.   REHAB POTENTIAL: Good  CLINICAL DECISION MAKING: Stable/uncomplicated  EVALUATION COMPLEXITY: Low   GOALS: Goals reviewed with patient? Yes  SHORT TERM GOALS: Target date: 03/26/24 All goals updated 04/19/24 Pt to be I with HEP for carry over and continuing recommendations for improved outcomes.   Baseline: Goal status: MET  2.  Pt will be independent with the knack, urge suppression technique, and double voiding in order to improve bladder habits and decrease urinary incontinence.   Baseline:  Goal status: MET  3.  Pt to report no more than 2x night time urination voids on average with or without medication per MD recommendation.  Baseline:  Goal status: MET  4.  Pt will be independent with use of squatty potty, relaxed toileting mechanics, and improved bowel  movement techniques in order to increase ease of bowel movements and complete evacuation.   Baseline:  Goal status: MET  LONG TERM GOALS: Target date: 08/26/24  Pt to be I with advanced HEP for carry over and continuing recommendations for improved outcomes.   Baseline:  Goal status: on going 11/25  2.  Pt will report her bowel movements are complete at least 4 times a week due to improved bowel habits and evacuation techniques.  Baseline:  Goal status: on going 11/25  3.  Pt will be able to correctly perform diaphragmatic breathing and appropriate pressure management in order to improve pelvic floor A/ROM.   Baseline:  Goal status: on going 11/25  4.  Pt to report no more than 2/10 pain at abdomen/bladder for decreased tension at abdomen and pelvic floor to ease emptying bladder. Baseline:  Goal status: on going 11/25  5.  Pt to report fully emptying bladder with voids no more than every 2 hours at least 75% of the time for improved bladder habits to complete errands without stopping to use restroom. Baseline:  Goal status: MET11/25  6.  Pt to report no pain with vaginal penetration in deep layer for improved tolerance to medical exams.  Baseline:  Goal status: on going 11/25  PLAN:  PT FREQUENCY: 2x/week  PT DURATION: 16 sessions  PLANNED INTERVENTIONS: 97110-Therapeutic exercises, 97530- Therapeutic activity, 97112- Neuromuscular re-education, 97535- Self Care, [id number]- Manual therapy, [phone]- Canalith repositioning, [id number]- Aquatic Therapy, [phone]- Electrical stimulation (manual), 97016- Vasopneumatic device, [phone] (1-2 muscles), [id number] (3+ muscles)- Dry Needling, Patient/Family education, Taping, Joint mobilization, Spinal mobilization, Scar mobilization, DME instructions, Cryotherapy, Moist heat, and Biofeedback  PLAN FOR NEXT SESSION: breathing mechanics, voiding mechanics, abdominal massage, review of pelvic wand if needed, manual internally if needed and pt agrees. reverse  clams  Darryle Navy, PT, DPT 11/25/252:44 PM  [facility] [address], [address] [place], [place] [address] Phone #  [phone] Fax [phone]

## 2024-05-07 DIAGNOSIS — M7918 Myalgia, other site: Secondary | ICD-10-CM | POA: Diagnosis not present

## 2024-05-07 DIAGNOSIS — M9903 Segmental and somatic dysfunction of lumbar region: Secondary | ICD-10-CM | POA: Diagnosis not present

## 2024-05-07 DIAGNOSIS — M9901 Segmental and somatic dysfunction of cervical region: Secondary | ICD-10-CM | POA: Diagnosis not present

## 2024-05-07 DIAGNOSIS — G8929 Other chronic pain: Secondary | ICD-10-CM | POA: Diagnosis not present

## 2024-05-07 DIAGNOSIS — M9907 Segmental and somatic dysfunction of upper extremity: Secondary | ICD-10-CM | POA: Diagnosis not present

## 2024-05-07 DIAGNOSIS — M542 Cervicalgia: Secondary | ICD-10-CM | POA: Diagnosis not present

## 2024-05-07 DIAGNOSIS — M545 Low back pain, unspecified: Secondary | ICD-10-CM | POA: Diagnosis not present

## 2024-05-07 DIAGNOSIS — M9902 Segmental and somatic dysfunction of thoracic region: Secondary | ICD-10-CM | POA: Diagnosis not present

## 2024-05-11 ENCOUNTER — Other Ambulatory Visit: Payer: Self-pay | Admitting: Radiology

## 2024-05-11 DIAGNOSIS — R6882 Decreased libido: Secondary | ICD-10-CM

## 2024-05-11 NOTE — Telephone Encounter (Signed)
 Med refill request: testosterone  (androgel  pump) 20.25mg /act Last AEX: 04/17/24 Next AEX: none scheduled Last MMG (if hormonal med) 01/05/21 BI-RADS 1 negative DX: low libido Last refill: 04/17/2024 Sent to provider for approval or denial.

## 2024-05-15 ENCOUNTER — Ambulatory Visit: Payer: Self-pay | Attending: Family Medicine | Admitting: Physical Therapy

## 2024-05-15 DIAGNOSIS — M6281 Muscle weakness (generalized): Secondary | ICD-10-CM | POA: Diagnosis not present

## 2024-05-15 DIAGNOSIS — R279 Unspecified lack of coordination: Secondary | ICD-10-CM | POA: Diagnosis not present

## 2024-05-15 DIAGNOSIS — R293 Abnormal posture: Secondary | ICD-10-CM | POA: Diagnosis not present

## 2024-05-15 DIAGNOSIS — M62838 Other muscle spasm: Secondary | ICD-10-CM | POA: Diagnosis not present

## 2024-05-15 DIAGNOSIS — R252 Cramp and spasm: Secondary | ICD-10-CM | POA: Diagnosis not present

## 2024-05-15 NOTE — Therapy (Signed)
 OUTPATIENT PHYSICAL THERAPY FEMALE PELVIC TREATMENT   Patient Name: Gabriela Shepard MRN: [id number] DOB:1961/02/27, 62 y.o., female Today's Date: 05/15/2024  END OF SESSION:  PT End of Session - 05/15/24 1509     Visit Number 8    Date for Recertification  08/26/24    Authorization Type [employer]    PT Start Time 1401    PT Stop Time 1444    PT Time Calculation (min) 43 min    Activity Tolerance Patient tolerated treatment well    Behavior During Therapy WFL for tasks assessed/performed             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   History of UTI    Interstitial cystitis    Low sodium levels    Migraines    Osteoporosis    PONV (postoperative nausea and vomiting)    PTSD (post-traumatic stress disorder)    on meds   STD (sexually transmitted disease)    Substance abuse (HCC)    hx of alcohol   Urine incontinence    Past Surgical History:  Procedure Laterality Date   BREAST BIOPSY Left    2017 benign x 2   CESAREAN SECTION  1992   COLONOSCOPY  2018   JMP-MAC-suprep(good)-polyps   INCONTINENCE SURGERY     LAPAROSCOPY     under bladder   NECK SURGERY  02/24/2018   ACDF   POLYPECTOMY  2018   polyps removed   TOTAL HIP ARTHROPLASTY Right 09/08/2020   TOTAL HIP ARTHROPLASTY Left 2018   TOTAL KNEE ARTHROPLASTY Left 12/23/2015   Procedure: TOTAL KNEE ARTHROPLASTY;  Surgeon: Maude Herald, MD;  Location: [facility] OR;  Service: Orthopedics;  Laterality: Left;   Patient Active Problem List   Diagnosis Date Noted   Abdominal pain, epigastric 08/04/2023   Encounter for long-term (current) use of NSAIDs 08/04/2023   Heartburn 08/04/2023   Iron  deficiency anemia 07/18/2023   B12 deficiency 06/14/2023   Left wrist pain 04/07/2023   Fibromyalgia 10/27/2022   Lumbar radiculopathy 09/25/2021   Low serum sodium 06/05/2021   MDD (major depressive disorder), recurrent severe, without psychosis (HCC) 06/03/2021   Impaired fasting glucose 06/03/2021   Osteoporosis 06/03/2021   Other specified abnormal immunological findings in serum 06/03/2021   Vitamin D  deficiency 06/03/2021   PTSD (post-traumatic stress disorder) 06/03/2021   Thyroid  nodule 03/24/2021   Lung nodule 03/03/2021   Easy bruising 03/03/2021   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   Globus sensation 12/23/2020   Left thyroid  nodule 12/23/2020   Polyarthralgia 10/28/2020   Interstitial cystitis 07/23/2020   Levator spasm 07/23/2020   Age-related osteoporosis without current pathological fracture 08/06/2019   Hyponatremia 08/06/2019   Multiple thyroid  nodules 08/06/2019   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   Piriformis syndrome, right 08/09/2018   Weight gain 02/08/2018   Trigger point of right shoulder region 12/22/2017   Cervical radiculopathy at C6 11/29/2017   Osteopenia 03/09/2016   Primary osteoarthritis of left knee 12/23/2015   IBS (irritable bowel syndrome) 02/19/2015   Postmenopausal 12/19/2013  Migraine headache 12/05/2012   Pain, upper back 09/24/2010   Depression, recurrent 09/04/2010    PCP: Micheal Wolm ORN, MD   REFERRING PROVIDER: Micheal Wolm ORN, MD   REFERRING DIAG: N81.89 (ICD-10-CM) - Pelvic floor weakness  THERAPY DIAG:  Other muscle spasm  Unspecified lack of coordination  Muscle weakness (generalized)  Abnormal posture  Rationale for Evaluation and Treatment: Rehabilitation  ONSET DATE: 5 weeks ago  SUBJECTIVE:                                                                                                                                                                                           SUBJECTIVE STATEMENT: Pt  reports overall she is doing much better, burning is relieved completely for several days after manual work and this is lasting longer before returning now.    eval Increased urinary frequency at night but has since had been on hormones and decreased from 5x to 2x. Also reports pressure at bladder and notices relief with bearing down. Does report pain at bladder and vaginal area intermittently and this feels better with bearing down too. Has been getting massages and doing more stretching.  MD prescribed OAB medication but reports she had urinary hesitancy but does help with pressure/pain. Has started decreasing it now.   Fluid intake: total - 60 oz  but unsure   FUNCTIONAL LIMITATIONS: sleeping, sometimes needs to call out of work,  PERTINENT HISTORY:  Medications for current condition: yes see chart Surgeries: Bil THA, Lt TKA Other: Osteopenia, depression, anxiety, IC, Fibromyalgia Sexual abuse: No  DIAGNOSTIC FINDINGS:  Post-void residual: Voiding Cystourethrogram (VCUG):  Ultrasound: PAIN:  not at this appointment 11/20   Are you having pain? Yes NPRS scale: 2/10 Pain location: bladder and vaginal  Pain type: aching and burning Pain description: intermittent   Aggravating factors: filling bladder Relieving factors: emptying bladder  PRECAUTIONS: None  RED FLAGS: None   WEIGHT BEARING RESTRICTIONS: No  FALLS:  Has patient fallen in last 6 months? No  OCCUPATION: yes  ACTIVITY LEVEL : low now decreased due to pain but was moderate  PLOF: Independent  PATIENT GOALS: to have better bladder habits, and fully relieve    BOWEL MOVEMENT: Pain with bowel movement: No Type of bowel movement:Type (Bristol Stool Scale) 1-2, Frequency every 3 days, and Strain to get started Fully empty rectum: No Leakage: No  Pads: No Fiber supplement/laxative No  URINATION: Pain with urination: No Fully empty bladder: No                                  Post-void dribble: No Stream: Strong but sometimes hesitation to get started  Urgency: Yes every once in a while Frequency:during the day 3-4x                                                          Nocturia: Yes:  2x now   Leakage: none Pads/briefs: No  INTERCOURSE:  Ability to have vaginal penetration Yes  Pain with intercourse: none Dryness: yes internally and externally Climax: not painful Marinoff Scale: 0/3 Low libido   PREGNANCY: Vaginal deliveries 1 Tearing Yes:   Episiotomy No C-section deliveries 1 Currently pregnant No  PROLAPSE: Pressure   OBJECTIVE:  Note: Objective measures were completed at Evaluation unless otherwise noted.    COGNITION: Overall cognitive status: Within functional limits for tasks assessed     SENSATION: Light touch: Appears intact  FUNCTIONAL TESTS:   Single leg stance: mild pelvic rotation and sway  Rt:8s   Lt:8s Sit-up test:1/3   GAIT: WFL  POSTURE: rounded shoulders, forward head, and posterior pelvic tilt   LUMBARAROM/PROM:  A/PROM A/PROM  Eval (% available)  Flexion Limited by 25%  Extension WFL  Right lateral flexion Limited by 25%  Left lateral flexion Limited by 25%  Right rotation Limited by 25%  Left rotation Limited by 25%   (Blank rows = not tested)  LOWER EXTREMITY ROM:  Bil hamstrings, adductors, hip ER, hip IR Limited by 25%  LOWER EXTREMITY MMT:  Bil hips grossly 4/5 PALPATION:  General: tightness noted in bil thoracic and lumbar paraspinals  Pelvic Alignment: WFL  Abdominal: tension throughout all quadrants, TTP and greatest tension at lower mid quadrant  Breathing: chest breathing but with cues able to complete diaphragmatic breathing                  External Perineal Exam: dryness throughout vulva, no TTP, but lightened tissue coloring, mild labia adhesion, mild clitoral hood adhesion                              Internal Pelvic Floor: TTP bil obturators,  anterior pelvic floor quadrant with several trigger points  Patient confirms identification and approves PT to assess internal pelvic floor and treatment Yes No emotional/communication barriers or cognitive limitation. Patient is motivated to learn. Patient understands and agrees with treatment goals and plan. PT explains patient will be examined in standing, sitting, and lying down to see how their muscles and joints work. When they are ready, they will be asked to remove their underwear so PT can examine their perineum. The patient is also given the option of providing their own chaperone as one is not provided in our facility. The patient also has the right and is explained the right to defer or refuse any part of the evaluation or treatment including the internal exam. With the patient's consent, PT will use one gloved finger to gently assess the muscles of the pelvic floor, seeing how well it contracts and relaxes and if  there is muscle symmetry. After, the patient will get dressed and PT and patient will discuss exam findings and plan of care. PT and patient discuss plan of care, schedule, attendance policy and HEP activities.  PELVIC MMT:   MMT eval  Vaginal 2-3/5; 4s; 4 reps  Internal Anal Sphincter   External Anal Sphincter   Puborectalis   Diastasis Recti   (Blank rows = not tested)        TONE: WFL  PROLAPSE: Not seen in hooklying with cough   TODAY'S TREATMENT:                                                                                                                              DATE:   05/15/24: Patient consented to internal pelvic floor treatment vaginally this date and found to have continued tension at anterior pelvic floor quadrant and both sides of urethra with gentle STM but greatly less than previous sessions, trigger point release and bimanual external release at mid lower abdominal quadrant to improve tissue relaxation with good effect. Did have small trigger point  on Rt anterior just behind pubic bone and pt reports this replicated her burning but released well. Bimanual with mid/low abdomen fascial release with internal urethral release. Pt also has tension at Lt bulbocavernosus PT completed gentle stretch  with good effect.  05/01/25: Patient consented to internal pelvic floor treatment vaginally this date and found to have continued tension at anterior pelvic floor quadrant and both sides of urethra with gentle STM but greatly less than previous sessions, trigger point release and bimanual external release at mid lower abdominal quadrant to improve tissue relaxation with good effect.  Bimanual with mid/low abdomen fascial release with internal urethral release. Pt also has tension at bil bulbocavernosus PT completed gentle stretch at bil sides one at time with good effect. Pt encouraged to continue relaxation techniques, diaphragmatic breathing mechanics and practicing good posture PATIENT EDUCATION:  Education details: [id number] Person educated: Patient Education method: Explanation, Demonstration, Tactile cues, Verbal cues, and Handouts Education comprehension: verbalized understanding, returned demonstration, verbal cues required, tactile cues required, and needs further education  HOME EXERCISE PROGRAM: [id number]  ASSESSMENT:  CLINICAL IMPRESSION: Patient is a 63 y.o. female  who was seen today for physical therapy evaluation and treatment for bladder and pelvic floor pain. Pt tolerated treatment well, had no pain at end of session and did report burning at start of session. Does continue to benefit from cues for relaxation and manual work externally and internally for improved tissue mobility and decreased pain. Pt very complaint and attempts recommendations at home but difficult to achieve deeper internal release work with wand vaginally per pt. Greatly benefits from internal work in clinic from skilled PT as pt reports she has had no burning this week  and bladder more fully emptying and states if burning returns between appointments its less intense and stays pain free for longer after appointments now. Pt would benefit  from additional PT to further address deficits.    OBJECTIVE IMPAIRMENTS: decreased activity tolerance, decreased coordination, decreased endurance, decreased mobility, decreased strength, increased fascial restrictions, impaired perceived functional ability, increased muscle spasms, impaired flexibility, improper body mechanics, postural dysfunction, and pain.   ACTIVITY LIMITATIONS: carrying, lifting, bending, sleeping, and locomotion level  PARTICIPATION LIMITATIONS: interpersonal relationship, community activity, and yard work  PERSONAL FACTORS: Time since onset of injury/illness/exacerbation and 1 comorbidity: medical history are also affecting patient's functional outcome.   REHAB POTENTIAL: Good  CLINICAL DECISION MAKING: Stable/uncomplicated  EVALUATION COMPLEXITY: Low   GOALS: Goals reviewed with patient? Yes  SHORT TERM GOALS: Target date: 03/26/24 All goals updated 04/19/24 Pt to be I with HEP for carry over and continuing recommendations for improved outcomes.   Baseline: Goal status: MET  2.  Pt will be independent with the knack, urge suppression technique, and double voiding in order to improve bladder habits and decrease urinary incontinence.   Baseline:  Goal status: MET  3.  Pt to report no more than 2x night time urination voids on average with or without medication per MD recommendation.  Baseline:  Goal status: MET  4.  Pt will be independent with use of squatty potty, relaxed toileting mechanics, and improved bowel movement techniques in order to increase ease of bowel movements and complete evacuation.   Baseline:  Goal status: MET  LONG TERM GOALS: Target date: 08/26/24  Pt to be I with advanced HEP for carry over and continuing recommendations for improved outcomes.   Baseline:   Goal status: on going 11/25  2.  Pt will report her bowel movements are complete at least 4 times a week due to improved bowel habits and evacuation techniques.  Baseline:  Goal status: on going 11/25  3.  Pt will be able to correctly perform diaphragmatic breathing and appropriate pressure management in order to improve pelvic floor A/ROM.   Baseline:  Goal status: on going 11/25  4.  Pt to report no more than 2/10 pain at abdomen/bladder for decreased tension at abdomen and pelvic floor to ease emptying bladder. Baseline:  Goal status: on going 11/25  5.  Pt to report fully emptying bladder with voids no more than every 2 hours at least 75% of the time for improved bladder habits to complete errands without stopping to use restroom. Baseline:  Goal status: MET11/25  6.  Pt to report no pain with vaginal penetration in deep layer for improved tolerance to medical exams.  Baseline:  Goal status: on going 11/25  PLAN:  PT FREQUENCY: 2x/week  PT DURATION: 16 sessions  PLANNED INTERVENTIONS: 97110-Therapeutic exercises, 97530- Therapeutic activity, 97112- Neuromuscular re-education, 97535- Self Care, 02859- Manual therapy, [phone]- Canalith repositioning, [id number]- Aquatic Therapy, [phone]- Electrical stimulation (manual), [id number]- Vasopneumatic device, [phone] (1-2 muscles), 20561 (3+ muscles)- Dry Needling, Patient/Family education, Taping, Joint mobilization, Spinal mobilization, Scar mobilization, DME instructions, Cryotherapy, Moist heat, and Biofeedback  PLAN FOR NEXT SESSION: breathing mechanics, voiding mechanics, abdominal massage, review of pelvic wand if needed, manual internally if needed and pt agrees. reverse clams  Darryle Navy, PT, DPT 12/09/253:50 PM  [facility] [address] [place], [place] [address] Phone # [phone] Fax [phone]

## 2024-05-22 ENCOUNTER — Ambulatory Visit: Payer: Self-pay | Admitting: Physical Therapy

## 2024-05-22 DIAGNOSIS — R279 Unspecified lack of coordination: Secondary | ICD-10-CM | POA: Diagnosis not present

## 2024-05-22 DIAGNOSIS — R252 Cramp and spasm: Secondary | ICD-10-CM | POA: Diagnosis not present

## 2024-05-22 DIAGNOSIS — M62838 Other muscle spasm: Secondary | ICD-10-CM | POA: Diagnosis not present

## 2024-05-22 DIAGNOSIS — M6281 Muscle weakness (generalized): Secondary | ICD-10-CM

## 2024-05-22 DIAGNOSIS — R293 Abnormal posture: Secondary | ICD-10-CM | POA: Diagnosis not present

## 2024-05-22 NOTE — Therapy (Signed)
 OUTPATIENT PHYSICAL THERAPY FEMALE PELVIC TREATMENT   Patient Name: Gabriela Shepard MRN: [id number] DOB:07/21/1960, 63 y.o., female Today's Date: 05/22/2024  END OF SESSION:  PT End of Session - 05/22/24 1407     Visit Number 9    Date for Recertification  08/26/24    Authorization Type [employer]    PT Start Time 1405    PT Stop Time 1444    PT Time Calculation (min) 39 min    Activity Tolerance Patient tolerated treatment well    Behavior During Therapy WFL for tasks assessed/performed             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   History of UTI    Interstitial cystitis    Low sodium levels    Migraines    Osteoporosis    PONV (postoperative nausea and vomiting)    PTSD (post-traumatic stress disorder)    on meds   STD (sexually transmitted disease)    Substance abuse (HCC)    hx of alcohol   Urine incontinence    Past Surgical History:  Procedure Laterality Date   BREAST BIOPSY Left    2017 benign x 2   CESAREAN SECTION  1992   COLONOSCOPY  2018   JMP-MAC-suprep(good)-polyps   INCONTINENCE SURGERY     LAPAROSCOPY     under bladder   NECK SURGERY  02/24/2018   ACDF   POLYPECTOMY  2018   polyps removed   TOTAL HIP ARTHROPLASTY Right 09/08/2020   TOTAL HIP ARTHROPLASTY Left 2018   TOTAL KNEE ARTHROPLASTY Left 12/23/2015   Procedure: TOTAL KNEE ARTHROPLASTY;  Surgeon: Maude Herald, MD;  Location: [facility] OR;  Service: Orthopedics;  Laterality: Left;   Patient Active Problem List   Diagnosis Date Noted   Abdominal pain, epigastric 08/04/2023   Encounter for long-term (current) use of NSAIDs 08/04/2023   Heartburn 08/04/2023   Iron  deficiency anemia 07/18/2023   B12 deficiency 06/14/2023   Left wrist pain 04/07/2023   Fibromyalgia 10/27/2022   Lumbar radiculopathy 09/25/2021   Low serum sodium 06/05/2021   MDD (major depressive disorder), recurrent severe, without psychosis (HCC) 06/03/2021   Impaired fasting glucose 06/03/2021   Osteoporosis 06/03/2021   Other specified abnormal immunological findings in serum 06/03/2021   Vitamin D  deficiency 06/03/2021   PTSD (post-traumatic stress disorder) 06/03/2021   Thyroid  nodule 03/24/2021   Lung nodule 03/03/2021   Easy bruising 03/03/2021   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   Globus sensation 12/23/2020   Left thyroid  nodule 12/23/2020   Polyarthralgia 10/28/2020   Interstitial cystitis 07/23/2020   Levator spasm 07/23/2020   Age-related osteoporosis without current pathological fracture 08/06/2019   Hyponatremia 08/06/2019   Multiple thyroid  nodules 08/06/2019   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   Piriformis syndrome, right 08/09/2018   Weight gain 02/08/2018   Trigger point of right shoulder region 12/22/2017   Cervical radiculopathy at C6 11/29/2017   Osteopenia 03/09/2016   Primary osteoarthritis of left knee 12/23/2015   IBS (irritable bowel syndrome) 02/19/2015   Postmenopausal 12/19/2013  Migraine headache 12/05/2012   Pain, upper back 09/24/2010   Depression, recurrent 09/04/2010    PCP: Micheal Wolm ORN, MD   REFERRING PROVIDER: Micheal Wolm ORN, MD   REFERRING DIAG: N81.89 (ICD-10-CM) - Pelvic floor weakness  THERAPY DIAG:  Unspecified lack of coordination  Muscle weakness (generalized)  Cramp and spasm  Rationale for Evaluation and Treatment: Rehabilitation  ONSET DATE: 5 weeks ago  SUBJECTIVE:                                                                                                                                                                                           SUBJECTIVE STATEMENT: Pt reports overall she  continues to do well, does have random burning returning, has been working on stress with therapist and thinks this helps too. Does have bleeding right now with switching out estrogen patches per pt, understands to let MD know if this doesn't improve.    eval Increased urinary frequency at night but has since had been on hormones and decreased from 5x to 2x. Also reports pressure at bladder and notices relief with bearing down. Does report pain at bladder and vaginal area intermittently and this feels better with bearing down too. Has been getting massages and doing more stretching.  MD prescribed OAB medication but reports she had urinary hesitancy but does help with pressure/pain. Has started decreasing it now.   Fluid intake: total - 60 oz  but unsure   FUNCTIONAL LIMITATIONS: sleeping, sometimes needs to call out of work,  PERTINENT HISTORY:  Medications for current condition: yes see chart Surgeries: Bil THA, Lt TKA Other: Osteopenia, depression, anxiety, IC, Fibromyalgia Sexual abuse: No  DIAGNOSTIC FINDINGS:  Post-void residual: Voiding Cystourethrogram (VCUG):  Ultrasound: PAIN:  not at this appointment 11/20   Are you having pain? Yes NPRS scale: 2/10 Pain location: bladder and vaginal  Pain type: aching and burning Pain description: intermittent   Aggravating factors: filling bladder Relieving factors: emptying bladder  PRECAUTIONS: None  RED FLAGS: None   WEIGHT BEARING RESTRICTIONS: No  FALLS:  Has patient fallen in last 6 months? No  OCCUPATION: yes  ACTIVITY LEVEL : low now decreased due to pain but was moderate  PLOF: Independent  PATIENT GOALS: to have better bladder habits, and fully relieve    BOWEL MOVEMENT: Pain with bowel movement: No Type of bowel movement:Type (Bristol Stool Scale) 1-2, Frequency every 3 days, and Strain to get started Fully empty rectum: No Leakage: No  Pads: No Fiber  supplement/laxative No  URINATION: Pain with urination: No Fully empty bladder: No                                 Post-void dribble: No Stream: Strong but sometimes hesitation to get started  Urgency: Yes every once in a while Frequency:during the day 3-4x                                                          Nocturia: Yes:  2x now   Leakage: none Pads/briefs: No  INTERCOURSE:  Ability to have vaginal penetration Yes  Pain with intercourse: none Dryness: yes internally and externally Climax: not painful Marinoff Scale: 0/3 Low libido   PREGNANCY: Vaginal deliveries 1 Tearing Yes:   Episiotomy No C-section deliveries 1 Currently pregnant No  PROLAPSE: Pressure   OBJECTIVE:  Note: Objective measures were completed at Evaluation unless otherwise noted.    COGNITION: Overall cognitive status: Within functional limits for tasks assessed     SENSATION: Light touch: Appears intact  FUNCTIONAL TESTS:   Single leg stance: mild pelvic rotation and sway  Rt:8s   Lt:8s Sit-up test:1/3   GAIT: WFL  POSTURE: rounded shoulders, forward head, and posterior pelvic tilt   LUMBARAROM/PROM:  A/PROM A/PROM  Eval (% available)  Flexion Limited by 25%  Extension WFL  Right lateral flexion Limited by 25%  Left lateral flexion Limited by 25%  Right rotation Limited by 25%  Left rotation Limited by 25%   (Blank rows = not tested)  LOWER EXTREMITY ROM:  Bil hamstrings, adductors, hip ER, hip IR Limited by 25%  LOWER EXTREMITY MMT:  Bil hips grossly 4/5 PALPATION:  General: tightness noted in bil thoracic and lumbar paraspinals  Pelvic Alignment: WFL  Abdominal: tension throughout all quadrants, TTP and greatest tension at lower mid quadrant  Breathing: chest breathing but with cues able to complete diaphragmatic breathing                  External Perineal Exam: dryness throughout vulva, no TTP, but lightened tissue coloring, mild labia adhesion, mild  clitoral hood adhesion                              Internal Pelvic Floor: TTP bil obturators, anterior pelvic floor quadrant with several trigger points  Patient confirms identification and approves PT to assess internal pelvic floor and treatment Yes No emotional/communication barriers or cognitive limitation. Patient is motivated to learn. Patient understands and agrees with treatment goals and plan. PT explains patient will be examined in standing, sitting, and lying down to see how their muscles and joints work. When they are ready, they will be asked to remove their underwear so PT can examine their perineum. The patient is also given the option of providing their own chaperone as one is not provided in our facility. The patient also has the right and is explained the right to defer or refuse any part of the evaluation or treatment including the internal exam. With the patient's consent, PT will use one gloved finger to gently assess the muscles of the pelvic floor, seeing how well it contracts and relaxes and if  there is muscle symmetry. After, the patient will get dressed and PT and patient will discuss exam findings and plan of care. PT and patient discuss plan of care, schedule, attendance policy and HEP activities.  PELVIC MMT:   MMT eval  Vaginal 2-3/5; 4s; 4 reps  Internal Anal Sphincter   External Anal Sphincter   Puborectalis   Diastasis Recti   (Blank rows = not tested)        TONE: WFL  PROLAPSE: Not seen in hooklying with cough   TODAY'S TREATMENT:                                                                                                                              DATE:   05/22/24 Patient consented to internal pelvic floor treatment vaginally this date and found to have continued tension at anterior pelvic floor quadrant Lt of urethra with gentle STM but greatly less than previous sessions, trigger point release and bimanual external release at mid lower abdominal  quadrant to improve tissue relaxation with good effect. Did have small trigger point on lt anterior just behind pubic bone and pt reports this replicated her burning but released well, greatly less today than last session and no tension at Rt side.  Pt also has tension at Lt bulbocavernosus PT completed gentle stretch  with good effect.  05/15/24: Patient consented to internal pelvic floor treatment vaginally this date and found to have continued tension at anterior pelvic floor quadrant and both sides of urethra with gentle STM but greatly less than previous sessions, trigger point release and bimanual external release at mid lower abdominal quadrant to improve tissue relaxation with good effect. Did have small trigger point on Rt anterior just behind pubic bone and pt reports this replicated her burning but released well. Bimanual with mid/low abdomen fascial release with internal urethral release. Pt also has tension at Lt bulbocavernosus PT completed gentle stretch  with good effect.  05/01/25: Patient consented to internal pelvic floor treatment vaginally this date and found to have continued tension at anterior pelvic floor quadrant and both sides of urethra with gentle STM but greatly less than previous sessions, trigger point release and bimanual external release at mid lower abdominal quadrant to improve tissue relaxation with good effect.  Bimanual with mid/low abdomen fascial release with internal urethral release. Pt also has tension at bil bulbocavernosus PT completed gentle stretch at bil sides one at time with good effect. Pt encouraged to continue relaxation techniques, diaphragmatic breathing mechanics and practicing good posture PATIENT EDUCATION:  Education details: [id number] Person educated: Patient Education method: Explanation, Demonstration, Tactile cues, Verbal cues, and Handouts Education comprehension: verbalized understanding, returned demonstration, verbal cues required, tactile  cues required, and needs further education  HOME EXERCISE PROGRAM: [id number]  ASSESSMENT:  CLINICAL IMPRESSION: Patient is a 63 y.o. female  who was seen today for physical therapy evaluation and treatment for bladder and pelvic floor pain. Pt tolerated treatment well,  Does continue to benefit from cues for relaxation and manual work externally and internally for improved tissue mobility and decreased pain. Pt very complaint and attempts recommendations at home but difficult to achieve deeper internal release work with wand vaginally per pt. Does continue to try between appointments and has seen improvement with symptoms and tissue mobility. Greatly benefits from internal work in clinic from skilled PT as pt reports she has had no burning this week and bladder more fully emptying and states if burning returns between appointments its less intense and stays pain free for longer after appointments now. Pt would benefit from additional PT to further address deficits.    OBJECTIVE IMPAIRMENTS: decreased activity tolerance, decreased coordination, decreased endurance, decreased mobility, decreased strength, increased fascial restrictions, impaired perceived functional ability, increased muscle spasms, impaired flexibility, improper body mechanics, postural dysfunction, and pain.   ACTIVITY LIMITATIONS: carrying, lifting, bending, sleeping, and locomotion level  PARTICIPATION LIMITATIONS: interpersonal relationship, community activity, and yard work  PERSONAL FACTORS: Time since onset of injury/illness/exacerbation and 1 comorbidity: medical history are also affecting patient's functional outcome.   REHAB POTENTIAL: Good  CLINICAL DECISION MAKING: Stable/uncomplicated  EVALUATION COMPLEXITY: Low   GOALS: Goals reviewed with patient? Yes  SHORT TERM GOALS: Target date: 03/26/24 All goals updated 04/19/24 Pt to be I with HEP for carry over and continuing recommendations for improved outcomes.    Baseline: Goal status: MET  2.  Pt will be independent with the knack, urge suppression technique, and double voiding in order to improve bladder habits and decrease urinary incontinence.   Baseline:  Goal status: MET  3.  Pt to report no more than 2x night time urination voids on average with or without medication per MD recommendation.  Baseline:  Goal status: MET  4.  Pt will be independent with use of squatty potty, relaxed toileting mechanics, and improved bowel movement techniques in order to increase ease of bowel movements and complete evacuation.   Baseline:  Goal status: MET  LONG TERM GOALS: Target date: 08/26/24  Pt to be I with advanced HEP for carry over and continuing recommendations for improved outcomes.   Baseline:  Goal status: on going 11/25  2.  Pt will report her bowel movements are complete at least 4 times a week due to improved bowel habits and evacuation techniques.  Baseline:  Goal status: on going 11/25  3.  Pt will be able to correctly perform diaphragmatic breathing and appropriate pressure management in order to improve pelvic floor A/ROM.   Baseline:  Goal status: on going 11/25  4.  Pt to report no more than 2/10 pain at abdomen/bladder for decreased tension at abdomen and pelvic floor to ease emptying bladder. Baseline:  Goal status: on going 11/25  5.  Pt to report fully emptying bladder with voids no more than every 2 hours at least 75% of the time for improved bladder habits to complete errands without stopping to use restroom. Baseline:  Goal status: MET11/25  6.  Pt to report no pain with vaginal penetration in deep layer for improved tolerance to medical exams.  Baseline:  Goal status: on going 11/25  PLAN:  PT FREQUENCY: 2x/week  PT DURATION: 16 sessions  PLANNED INTERVENTIONS: 97110-Therapeutic exercises, 97530- Therapeutic activity, 97112- Neuromuscular re-education, 97535- Self Care, [id number]- Manual therapy, [phone]- Canalith  repositioning, [id number]- Aquatic Therapy, [phone]- Electrical stimulation (manual), [id number]- Vasopneumatic device, [phone] (1-2 muscles), [id number] (3+ muscles)- Dry Needling, Patient/Family education, Taping, Joint mobilization, Spinal mobilization, Scar mobilization, DME instructions,  Cryotherapy, Moist heat, and Biofeedback  PLAN FOR NEXT SESSION: breathing mechanics, voiding mechanics, abdominal massage, review of pelvic wand if needed, manual internally if needed and pt agrees. reverse clams  Darryle Navy, PT, DPT 12/16/20253:34 PM  [facility] [address], [address] [place], [place] [address] Phone # [phone] Fax [phone]

## 2024-05-29 ENCOUNTER — Ambulatory Visit: Admitting: Physical Therapy

## 2024-06-05 DIAGNOSIS — M9904 Segmental and somatic dysfunction of sacral region: Secondary | ICD-10-CM | POA: Diagnosis not present

## 2024-06-05 DIAGNOSIS — M9901 Segmental and somatic dysfunction of cervical region: Secondary | ICD-10-CM | POA: Diagnosis not present

## 2024-06-05 DIAGNOSIS — G8929 Other chronic pain: Secondary | ICD-10-CM | POA: Diagnosis not present

## 2024-06-05 DIAGNOSIS — M5442 Lumbago with sciatica, left side: Secondary | ICD-10-CM | POA: Diagnosis not present

## 2024-06-05 DIAGNOSIS — M9903 Segmental and somatic dysfunction of lumbar region: Secondary | ICD-10-CM | POA: Diagnosis not present

## 2024-06-05 DIAGNOSIS — M9905 Segmental and somatic dysfunction of pelvic region: Secondary | ICD-10-CM | POA: Diagnosis not present

## 2024-06-05 DIAGNOSIS — M9906 Segmental and somatic dysfunction of lower extremity: Secondary | ICD-10-CM | POA: Diagnosis not present

## 2024-06-12 ENCOUNTER — Ambulatory Visit: Payer: Self-pay | Attending: Family Medicine | Admitting: Physical Therapy

## 2024-06-12 DIAGNOSIS — M62838 Other muscle spasm: Secondary | ICD-10-CM | POA: Diagnosis present

## 2024-06-12 DIAGNOSIS — R279 Unspecified lack of coordination: Secondary | ICD-10-CM | POA: Insufficient documentation

## 2024-06-12 NOTE — Therapy (Unsigned)
 " OUTPATIENT PHYSICAL THERAPY FEMALE PELVIC TREATMENT   Patient Name: Gabriela Shepard MRN: [id number] DOB:11-28-1960, 64 y.o., female Today's Date: 06/13/2024  END OF SESSION:  PT End of Session - 06/12/24 1700     Visit Number 10    Date for Recertification  08/26/24    Authorization Type BCBS    PT Start Time 1532    PT Stop Time 1610    PT Time Calculation (min) 38 min    Activity Tolerance Patient tolerated treatment well    Behavior During Therapy [facility] for tasks assessed/performed          PT End of Session - 06/12/24 1700     Visit Number 10    Date for Recertification  08/26/24    Authorization Type BCBS    PT Start Time 1532    PT Stop Time 1610    PT Time Calculation (min) 38 min    Activity Tolerance Patient tolerated treatment well    Behavior During Therapy WFL for tasks assessed/performed              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   History of UTI    Interstitial cystitis    Low sodium levels    Migraines    Osteoporosis    PONV (postoperative nausea and vomiting)    PTSD (post-traumatic stress disorder)    on meds   STD (sexually transmitted disease)    Substance abuse (HCC)    hx of alcohol   Urine incontinence    Past Surgical History:  Procedure Laterality Date   BREAST BIOPSY Left    2017 benign x 2   CESAREAN SECTION  1992   COLONOSCOPY  2018   JMP-MAC-suprep(good)-polyps   INCONTINENCE SURGERY     LAPAROSCOPY     under bladder   NECK SURGERY  02/24/2018   ACDF   POLYPECTOMY  2018   polyps removed   TOTAL HIP ARTHROPLASTY Right 09/08/2020   TOTAL HIP ARTHROPLASTY Left 2018   TOTAL KNEE  ARTHROPLASTY Left 12/23/2015   Procedure: TOTAL KNEE ARTHROPLASTY;  Surgeon: Maude Herald, MD;  Location: [facility] OR;  Service: Orthopedics;  Laterality: Left;   Patient Active Problem List   Diagnosis Date Noted   Abdominal pain, epigastric 08/04/2023   Encounter for long-term (current) use of NSAIDs 08/04/2023   Heartburn 08/04/2023   Iron deficiency anemia 07/18/2023   B12 deficiency 06/14/2023   Left wrist pain 04/07/2023   Fibromyalgia 10/27/2022   Lumbar radiculopathy 09/25/2021   Low serum sodium 06/05/2021   MDD (major depressive disorder), recurrent severe, without psychosis (HCC) 06/03/2021   Impaired fasting glucose 06/03/2021   Osteoporosis 06/03/2021   Other specified abnormal immunological findings in serum 06/03/2021   Vitamin D  deficiency 06/03/2021   PTSD (post-traumatic stress disorder) 06/03/2021   Thyroid  nodule 03/24/2021   Lung nodule 03/03/2021   Easy bruising 03/03/2021   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   Globus sensation 12/23/2020   Left thyroid  nodule 12/23/2020   Polyarthralgia 10/28/2020   Interstitial cystitis 07/23/2020  Levator spasm 07/23/2020   Age-related osteoporosis without current pathological fracture 08/06/2019   Hyponatremia 08/06/2019   Multiple thyroid  nodules 08/06/2019   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   Piriformis syndrome, right 08/09/2018   Weight gain 02/08/2018   Trigger point of right shoulder region 12/22/2017   Cervical radiculopathy at C6 11/29/2017   Osteopenia 03/09/2016   Primary osteoarthritis of left knee 12/23/2015   IBS (irritable bowel syndrome) 02/19/2015   Postmenopausal 12/19/2013   Migraine headache 12/05/2012   Pain, upper back 09/24/2010   Depression, recurrent 09/04/2010    PCP: Micheal Wolm ORN, MD   REFERRING PROVIDER: Micheal Wolm ORN, MD   REFERRING DIAG: N81.89 (ICD-10-CM) - Pelvic floor weakness  THERAPY DIAG:  Unspecified lack of coordination  Other muscle spasm  Rationale for  Evaluation and Treatment: Rehabilitation  ONSET DATE: 5 weeks ago  SUBJECTIVE:                                                                                                                                                                                           SUBJECTIVE STATEMENT: Pt reports she has been doing well for about 3 weeks, but brother just died unexpectedly and having a lot of stress right now and pain has returned.    eval Increased urinary frequency at night but has since had been on hormones and decreased from 5x to 2x. Also reports pressure at bladder and notices relief with bearing down. Does report pain at bladder and vaginal area intermittently and this feels better with bearing down too. Has been getting massages and doing more stretching.  MD prescribed OAB medication but reports she had urinary hesitancy but does help with pressure/pain. Has started decreasing it now.   Fluid intake: total - 60 oz  but unsure   FUNCTIONAL LIMITATIONS: sleeping, sometimes needs to call out of work,  PERTINENT HISTORY:  Medications for current condition: yes see chart Surgeries: Bil THA, Lt TKA Other: Osteopenia, depression, anxiety, IC, Fibromyalgia Sexual abuse: No  DIAGNOSTIC FINDINGS:  Post-void residual: Voiding Cystourethrogram (VCUG):  Ultrasound: PAIN:  not at this appointment 11/20   Are you having pain? Yes NPRS scale: 2/10 Pain location: bladder and vaginal  Pain type: aching and burning Pain description: intermittent   Aggravating factors: filling bladder Relieving factors: emptying bladder  PRECAUTIONS: None  RED FLAGS: None   WEIGHT BEARING RESTRICTIONS: No  FALLS:  Has patient fallen in last 6 months? No  OCCUPATION: yes  ACTIVITY LEVEL : low now decreased due to pain but was moderate  PLOF: Independent  PATIENT GOALS: to have better bladder habits, and fully relieve  BOWEL MOVEMENT: Pain with bowel movement: No Type of bowel  movement:Type (Bristol Stool Scale) 1-2, Frequency every 3 days, and Strain to get started Fully empty rectum: No Leakage: No                                                   Pads: No Fiber supplement/laxative No  URINATION: Pain with urination: No Fully empty bladder: No                                 Post-void dribble: No Stream: Strong but sometimes hesitation to get started  Urgency: Yes every once in a while Frequency:during the day 3-4x                                                          Nocturia: Yes:  2x now   Leakage: none Pads/briefs: No  INTERCOURSE:  Ability to have vaginal penetration Yes  Pain with intercourse: none Dryness: yes internally and externally Climax: not painful Marinoff Scale: 0/3 Low libido   PREGNANCY: Vaginal deliveries 1 Tearing Yes:   Episiotomy No C-section deliveries 1 Currently pregnant No  PROLAPSE: Pressure   OBJECTIVE:  Note: Objective measures were completed at Evaluation unless otherwise noted.    COGNITION: Overall cognitive status: Within functional limits for tasks assessed     SENSATION: Light touch: Appears intact  FUNCTIONAL TESTS:   Single leg stance: mild pelvic rotation and sway  Rt:8s   Lt:8s Sit-up test:1/3   GAIT: WFL  POSTURE: rounded shoulders, forward head, and posterior pelvic tilt   LUMBARAROM/PROM:  A/PROM A/PROM  Eval (% available)  Flexion Limited by 25%  Extension WFL  Right lateral flexion Limited by 25%  Left lateral flexion Limited by 25%  Right rotation Limited by 25%  Left rotation Limited by 25%   (Blank rows = not tested)  LOWER EXTREMITY ROM:  Bil hamstrings, adductors, hip ER, hip IR Limited by 25%  LOWER EXTREMITY MMT:  Bil hips grossly 4/5 PALPATION:  General: tightness noted in bil thoracic and lumbar paraspinals  Pelvic Alignment: WFL  Abdominal: tension throughout all quadrants, TTP and greatest tension at lower mid quadrant  Breathing: chest  breathing but with cues able to complete diaphragmatic breathing                  External Perineal Exam: dryness throughout vulva, no TTP, but lightened tissue coloring, mild labia adhesion, mild clitoral hood adhesion                              Internal Pelvic Floor: TTP bil obturators, anterior pelvic floor quadrant with several trigger points  Patient confirms identification and approves PT to assess internal pelvic floor and treatment Yes No emotional/communication barriers or cognitive limitation. Patient is motivated to learn. Patient understands and agrees with treatment goals and plan. PT explains patient will be examined in standing, sitting, and lying down to see how their muscles and joints work. When they are ready, they will be asked to remove their underwear  so PT can examine their perineum. The patient is also given the option of providing their own chaperone as one is not provided in our facility. The patient also has the right and is explained the right to defer or refuse any part of the evaluation or treatment including the internal exam. With the patient's consent, PT will use one gloved finger to gently assess the muscles of the pelvic floor, seeing how well it contracts and relaxes and if there is muscle symmetry. After, the patient will get dressed and PT and patient will discuss exam findings and plan of care. PT and patient discuss plan of care, schedule, attendance policy and HEP activities.  PELVIC MMT:   MMT eval  Vaginal 2-3/5; 4s; 4 reps  Internal Anal Sphincter   External Anal Sphincter   Puborectalis   Diastasis Recti   (Blank rows = not tested)        TONE: WFL  PROLAPSE: Not seen in hooklying with cough   TODAY'S TREATMENT:                                                                                                                              DATE:   06/12/24: Patient consented to internal pelvic floor treatment vaginally this date and found to have  continued tension at anterior pelvic floor quadrant Lt of urethra with gentle STM but greatly less than previous sessions, trigger point release and bimanual external release at mid lower abdominal quadrant to improve tissue relaxation with good effect. Tension improved greatly with cues for relaxation, diaphragmatic breathing and pelvic drops  05/22/24 Patient consented to internal pelvic floor treatment vaginally this date and found to have continued tension at anterior pelvic floor quadrant Lt of urethra with gentle STM but greatly less than previous sessions, trigger point release and bimanual external release at mid lower abdominal quadrant to improve tissue relaxation with good effect. Did have small trigger point on lt anterior just behind pubic bone and pt reports this replicated her burning but released well, greatly less today than last session and no tension at Rt side.  Pt also has tension at Lt bulbocavernosus PT completed gentle stretch  with good effect.  05/15/24: Patient consented to internal pelvic floor treatment vaginally this date and found to have continued tension at anterior pelvic floor quadrant and both sides of urethra with gentle STM but greatly less than previous sessions, trigger point release and bimanual external release at mid lower abdominal quadrant to improve tissue relaxation with good effect. Did have small trigger point on Rt anterior just behind pubic bone and pt reports this replicated her burning but released well. Bimanual with mid/low abdomen fascial release with internal urethral release. Pt also has tension at Lt bulbocavernosus PT completed gentle stretch  with good effect.  05/01/25: Patient consented to internal pelvic floor treatment vaginally this date and found to have continued tension at anterior pelvic floor quadrant and both sides of  urethra with gentle STM but greatly less than previous sessions, trigger point release and bimanual external release at  mid lower abdominal quadrant to improve tissue relaxation with good effect.  Bimanual with mid/low abdomen fascial release with internal urethral release. Pt also has tension at bil bulbocavernosus PT completed gentle stretch at bil sides one at time with good effect. Pt encouraged to continue relaxation techniques, diaphragmatic breathing mechanics and practicing good posture PATIENT EDUCATION:  Education details: [id number] Person educated: Patient Education method: Explanation, Demonstration, Tactile cues, Verbal cues, and Handouts Education comprehension: verbalized understanding, returned demonstration, verbal cues required, tactile cues required, and needs further education  HOME EXERCISE PROGRAM: [id number]  ASSESSMENT:  CLINICAL IMPRESSION: Patient is a 64 y.o. female  who was seen today for physical therapy evaluation and treatment for bladder and pelvic floor pain. Pt tolerated treatment well, Does continue to benefit from cues for relaxation and manual work externally and internally for improved tissue mobility and decreased pain. Pt continues to be complaint and attempts recommendations at home but difficult to achieve deeper internal release work with wand vaginally per pt. Does continue to try between appointments and has seen improvement with symptoms and tissue mobility. Pt would benefit from additional PT to further address deficits.    OBJECTIVE IMPAIRMENTS: decreased activity tolerance, decreased coordination, decreased endurance, decreased mobility, decreased strength, increased fascial restrictions, impaired perceived functional ability, increased muscle spasms, impaired flexibility, improper body mechanics, postural dysfunction, and pain.   ACTIVITY LIMITATIONS: carrying, lifting, bending, sleeping, and locomotion level  PARTICIPATION LIMITATIONS: interpersonal relationship, community activity, and yard work  PERSONAL FACTORS: Time since onset of injury/illness/exacerbation  and 1 comorbidity: medical history are also affecting patient's functional outcome.   REHAB POTENTIAL: Good  CLINICAL DECISION MAKING: Stable/uncomplicated  EVALUATION COMPLEXITY: Low   GOALS: Goals reviewed with patient? Yes  SHORT TERM GOALS: Target date: 03/26/24 All goals updated 04/19/24 Pt to be I with HEP for carry over and continuing recommendations for improved outcomes.   Baseline: Goal status: MET  2.  Pt will be independent with the knack, urge suppression technique, and double voiding in order to improve bladder habits and decrease urinary incontinence.   Baseline:  Goal status: MET  3.  Pt to report no more than 2x night time urination voids on average with or without medication per MD recommendation.  Baseline:  Goal status: MET  4.  Pt will be independent with use of squatty potty, relaxed toileting mechanics, and improved bowel movement techniques in order to increase ease of bowel movements and complete evacuation.   Baseline:  Goal status: MET  LONG TERM GOALS: Target date: 08/26/24  Pt to be I with advanced HEP for carry over and continuing recommendations for improved outcomes.   Baseline:  Goal status: on going 11/25  2.  Pt will report her bowel movements are complete at least 4 times a week due to improved bowel habits and evacuation techniques.  Baseline:  Goal status: on going 11/25  3.  Pt will be able to correctly perform diaphragmatic breathing and appropriate pressure management in order to improve pelvic floor A/ROM.   Baseline:  Goal status: on going 11/25  4.  Pt to report no more than 2/10 pain at abdomen/bladder for decreased tension at abdomen and pelvic floor to ease emptying bladder. Baseline:  Goal status: on going 11/25  5.  Pt to report fully emptying bladder with voids no more than every 2 hours at least 75% of the time for improved  bladder habits to complete errands without stopping to use restroom. Baseline:  Goal status:  MET11/25  6.  Pt to report no pain with vaginal penetration in deep layer for improved tolerance to medical exams.  Baseline:  Goal status: on going 11/25  PLAN:  PT FREQUENCY: 2x/week  PT DURATION: 16 sessions  PLANNED INTERVENTIONS: 97110-Therapeutic exercises, 97530- Therapeutic activity, 97112- Neuromuscular re-education, 97535- Self Care, 02859- Manual therapy, [phone]- Canalith repositioning, [id number]- Aquatic Therapy, [phone]- Electrical stimulation (manual), 97016- Vasopneumatic device, [phone] (1-2 muscles), 20561 (3+ muscles)- Dry Needling, Patient/Family education, Taping, Joint mobilization, Spinal mobilization, Scar mobilization, DME instructions, Cryotherapy, Moist heat, and Biofeedback  PLAN FOR NEXT SESSION: breathing mechanics, voiding mechanics, abdominal massage, review of pelvic wand if needed, manual internally if needed and pt agrees. reverse clams  Darryle Navy, [place], DPT 1/7/20268:01 AM  [facility] [address], [address] [place], [place] [address] Phone # [phone] Fax [phone]   "

## 2024-06-18 ENCOUNTER — Encounter: Payer: Self-pay | Admitting: *Deleted

## 2024-06-27 ENCOUNTER — Ambulatory Visit: Admitting: Family Medicine

## 2024-06-27 ENCOUNTER — Encounter: Payer: Self-pay | Admitting: Family Medicine

## 2024-06-27 VITALS — BP 104/70 | HR 71 | Temp 97.6°F | Wt 142.7 lb

## 2024-06-27 DIAGNOSIS — R5383 Other fatigue: Secondary | ICD-10-CM

## 2024-06-27 DIAGNOSIS — J0191 Acute recurrent sinusitis, unspecified: Secondary | ICD-10-CM | POA: Diagnosis not present

## 2024-06-27 DIAGNOSIS — E871 Hypo-osmolality and hyponatremia: Secondary | ICD-10-CM | POA: Diagnosis not present

## 2024-06-27 LAB — CBC WITH DIFFERENTIAL/PLATELET
Basophils Absolute: 0.1 K/uL (ref 0.0–0.1)
Basophils Relative: 0.9 % (ref 0.0–3.0)
Eosinophils Absolute: 0 K/uL (ref 0.0–0.7)
Eosinophils Relative: 0.7 % (ref 0.0–5.0)
HCT: 42 % (ref 36.0–46.0)
Hemoglobin: 14.1 g/dL (ref 12.0–15.0)
Lymphocytes Relative: 18.2 % (ref 12.0–46.0)
Lymphs Abs: 1.2 K/uL (ref 0.7–4.0)
MCHC: 33.6 g/dL (ref 30.0–36.0)
MCV: 91.3 fl (ref 78.0–100.0)
Monocytes Absolute: 0.6 K/uL (ref 0.1–1.0)
Monocytes Relative: 9.6 % (ref 3.0–12.0)
Neutro Abs: 4.5 K/uL (ref 1.4–7.7)
Neutrophils Relative %: 70.6 % (ref 43.0–77.0)
Platelets: 353 K/uL (ref 150.0–400.0)
RBC: 4.6 Mil/uL (ref 3.87–5.11)
RDW: 12.7 % (ref 11.5–15.5)
WBC: 6.4 K/uL (ref 4.0–10.5)

## 2024-06-27 LAB — BASIC METABOLIC PANEL WITH GFR
BUN: 10 mg/dL (ref 6–23)
CO2: 29 meq/L (ref 19–32)
Calcium: 8.9 mg/dL (ref 8.4–10.5)
Chloride: 96 meq/L (ref 96–112)
Creatinine, Ser: 0.66 mg/dL (ref 0.40–1.20)
GFR: 93.28 mL/min
Glucose, Bld: 81 mg/dL (ref 70–99)
Potassium: 4.2 meq/L (ref 3.5–5.1)
Sodium: 129 meq/L — ABNORMAL LOW (ref 135–145)

## 2024-06-27 MED ORDER — CEFDINIR 300 MG PO CAPS: 300.0000 mg | ORAL_CAPSULE | Freq: Two times a day (BID) | ORAL | 0 refills | Status: DC
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
## 2024-06-28 ENCOUNTER — Ambulatory Visit: Payer: Self-pay | Admitting: Family Medicine

## 2024-06-28 DIAGNOSIS — R7989 Other specified abnormal findings of blood chemistry: Secondary | ICD-10-CM

## 2024-07-01 ENCOUNTER — Other Ambulatory Visit: Payer: Self-pay | Admitting: Family Medicine

## 2024-07-02 ENCOUNTER — Other Ambulatory Visit: Payer: Self-pay | Admitting: Family Medicine

## 2024-07-02 DIAGNOSIS — E871 Hypo-osmolality and hyponatremia: Secondary | ICD-10-CM

## 2024-07-03 ENCOUNTER — Ambulatory Visit (INDEPENDENT_AMBULATORY_CARE_PROVIDER_SITE_OTHER): Admitting: Radiology

## 2024-07-03 ENCOUNTER — Encounter: Payer: Self-pay | Admitting: Radiology

## 2024-07-03 DIAGNOSIS — Z79890 Hormone replacement therapy (postmenopausal): Secondary | ICD-10-CM

## 2024-07-03 MED ORDER — ESTRADIOL 0.05 MG/24HR TD PTTW: 1.0000 | MEDICATED_PATCH | TRANSDERMAL | 4 refills | Status: AC

## 2024-07-03 MED ORDER — PROGESTERONE MICRONIZED 100 MG PO CAPS: 200.0000 mg | ORAL_CAPSULE | Freq: Every day | ORAL | 4 refills | Status: AC

## 2024-07-03 NOTE — Progress Notes (Signed)
" ° ° ° ° °  Subjective: Gabriela Shepard is a 64 y.o. female here for Hrt follow up. Has some scant brown bleeding 1-2 per month since starting HRT. We increased progesterone  and estrogen 2 months ago and no change in bleeding. No other symptoms.  Review of Systems  All other systems reviewed and are negative.   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   Physical Exam Constitutional:      Appearance: Normal appearance. She is normal weight.  Pulmonary:     Effort: Pulmonary effort is normal.  Genitourinary:    Comments: No bleeding today, declines pelvic and u/s at this point Neurological:     Mental Status: She is alert.  Psychiatric:        Mood and Affect: Mood normal.        Thought Content: Thought content normal.        Judgment: Judgment normal.     Assessment:/Plan:   1. Hormone replacement therapy (HRT) Prefers to 'watch and wait' if her body adjusts to HRT and bleeding stops. Declines u/s at this time. Continue progesterone  200mg  nightly. If bleeding persists another 6-8 weeks will agree to u/s. - estradiol  (VIVELLE -DOT) 0.05 MG/24HR patch; Place 1 patch (0.05 mg total) onto the skin 2 (two) times a week.   Dispense: 24 patch; Refill: 4 - progesterone  (PROMETRIUM ) 100 MG capsule; Take 2 capsules (200 mg total) by mouth daily.  Dispense: 180 capsule; Refill: 4     Bonnita Newby B, NP 2:32 PM  "

## 2024-07-05 ENCOUNTER — Encounter: Payer: Self-pay | Admitting: Family Medicine

## 2024-07-06 MED ORDER — VITAMIN D (ERGOCALCIFEROL) 1.25 MG (50000 UNIT) PO CAPS: 50000.0000 [IU] | ORAL_CAPSULE | ORAL | 2 refills | Status: AC

## 2024-07-06 MED ORDER — CEFDINIR 300 MG PO CAPS: 300.0000 mg | ORAL_CAPSULE | Freq: Two times a day (BID) | ORAL | 0 refills | Status: AC

## 2024-07-10 ENCOUNTER — Ambulatory Visit: Attending: Family Medicine | Admitting: Physical Therapy

## 2024-07-10 ENCOUNTER — Ambulatory Visit: Admitting: Radiology

## 2024-07-10 DIAGNOSIS — M62838 Other muscle spasm: Secondary | ICD-10-CM

## 2024-07-11 ENCOUNTER — Telehealth: Payer: Self-pay

## 2024-07-11 MED ORDER — ESTRADIOL 0.0375 MG/24HR TD PTWK: 0.0375 mg | MEDICATED_PATCH | TRANSDERMAL | 0 refills | Status: AC

## 2024-07-11 NOTE — Telephone Encounter (Signed)
 Patient states her estradiol  patch 0.05mg  was sent to [facility] pharmacy. She states they told her they are all out. She said she is suppose to put on a patch today & is bleeding. [facility] told her they have the 0.0375mg  patch in stock. Patient would like to know what to do because she can not be without her patch. Please advise.

## 2024-07-11 NOTE — Telephone Encounter (Signed)
 Pharmacy is all out of twice a week 0.0375mg  patch. Per [place], okay to do once weekly patch #4 with 0 refills. Patient is aware.

## 2024-07-11 NOTE — Telephone Encounter (Signed)
 Ok to sub the 0.0375mg  for 1 month ( 8 patches)

## 2024-07-17 ENCOUNTER — Ambulatory Visit: Admitting: Physical Therapy

## 2024-07-18 ENCOUNTER — Other Ambulatory Visit

## 2024-08-06 ENCOUNTER — Ambulatory Visit: Attending: Family Medicine | Admitting: Physical Therapy

## 2024-10-18 ENCOUNTER — Ambulatory Visit: Admitting: Dermatology
